# Patient Record
Sex: Female | Born: 1980
Health system: Southern US, Community
[De-identification: ages and names within clinical notes are randomized; demographics above are authoritative.]

## PROBLEM LIST (undated history)

## (undated) DIAGNOSIS — R011 Cardiac murmur, unspecified: Secondary | ICD-10-CM

## (undated) DIAGNOSIS — Z972 Presence of dental prosthetic device (complete) (partial): Secondary | ICD-10-CM

## (undated) DIAGNOSIS — M8700 Idiopathic aseptic necrosis of unspecified bone: Secondary | ICD-10-CM

## (undated) DIAGNOSIS — N979 Female infertility, unspecified: Secondary | ICD-10-CM

## (undated) DIAGNOSIS — E119 Type 2 diabetes mellitus without complications: Secondary | ICD-10-CM

## (undated) DIAGNOSIS — B20 Human immunodeficiency virus [HIV] disease: Secondary | ICD-10-CM

## (undated) DIAGNOSIS — Z862 Personal history of diseases of the blood and blood-forming organs and certain disorders involving the immune mechanism: Secondary | ICD-10-CM

## (undated) DIAGNOSIS — S62109A Fracture of unspecified carpal bone, unspecified wrist, initial encounter for closed fracture: Secondary | ICD-10-CM

## (undated) DIAGNOSIS — E785 Hyperlipidemia, unspecified: Secondary | ICD-10-CM

## (undated) DIAGNOSIS — M549 Dorsalgia, unspecified: Secondary | ICD-10-CM

## (undated) DIAGNOSIS — K760 Fatty (change of) liver, not elsewhere classified: Secondary | ICD-10-CM

## (undated) DIAGNOSIS — Z21 Asymptomatic human immunodeficiency virus [HIV] infection status: Secondary | ICD-10-CM

## (undated) DIAGNOSIS — F329 Major depressive disorder, single episode, unspecified: Secondary | ICD-10-CM

## (undated) DIAGNOSIS — Z87828 Personal history of other (healed) physical injury and trauma: Secondary | ICD-10-CM

## (undated) DIAGNOSIS — J302 Other seasonal allergic rhinitis: Secondary | ICD-10-CM

## (undated) DIAGNOSIS — I10 Essential (primary) hypertension: Secondary | ICD-10-CM

## (undated) DIAGNOSIS — E039 Hypothyroidism, unspecified: Secondary | ICD-10-CM

## (undated) DIAGNOSIS — J45909 Unspecified asthma, uncomplicated: Secondary | ICD-10-CM

## (undated) DIAGNOSIS — R945 Abnormal results of liver function studies: Secondary | ICD-10-CM

## (undated) DIAGNOSIS — J189 Pneumonia, unspecified organism: Secondary | ICD-10-CM

## (undated) DIAGNOSIS — F909 Attention-deficit hyperactivity disorder, unspecified type: Secondary | ICD-10-CM

## (undated) DIAGNOSIS — E282 Polycystic ovarian syndrome: Secondary | ICD-10-CM

## (undated) DIAGNOSIS — F32A Depression, unspecified: Secondary | ICD-10-CM

## (undated) DIAGNOSIS — R7989 Other specified abnormal findings of blood chemistry: Secondary | ICD-10-CM

## (undated) DIAGNOSIS — E669 Obesity, unspecified: Secondary | ICD-10-CM

## (undated) DIAGNOSIS — R0602 Shortness of breath: Secondary | ICD-10-CM

## (undated) DIAGNOSIS — K219 Gastro-esophageal reflux disease without esophagitis: Secondary | ICD-10-CM

## (undated) DIAGNOSIS — F419 Anxiety disorder, unspecified: Secondary | ICD-10-CM

## (undated) DIAGNOSIS — Z973 Presence of spectacles and contact lenses: Secondary | ICD-10-CM

## (undated) HISTORY — DX: Shortness of breath: R06.02

## (undated) HISTORY — DX: Dorsalgia, unspecified: M54.9

## (undated) HISTORY — PX: OTHER SURGICAL HISTORY: SHX169

## (undated) HISTORY — DX: Female infertility, unspecified: N97.9

---

## 2010-08-30 LAB — HM PAP SMEAR: HM Pap smear: NEGATIVE

## 2010-10-31 ENCOUNTER — Ambulatory Visit: Payer: Self-pay

## 2010-11-09 DIAGNOSIS — Z113 Encounter for screening for infections with a predominantly sexual mode of transmission: Secondary | ICD-10-CM

## 2010-11-09 DIAGNOSIS — B20 Human immunodeficiency virus [HIV] disease: Secondary | ICD-10-CM

## 2010-11-14 ENCOUNTER — Ambulatory Visit (INDEPENDENT_AMBULATORY_CARE_PROVIDER_SITE_OTHER): Payer: Self-pay

## 2010-11-14 DIAGNOSIS — F909 Attention-deficit hyperactivity disorder, unspecified type: Secondary | ICD-10-CM | POA: Insufficient documentation

## 2010-11-14 DIAGNOSIS — B20 Human immunodeficiency virus [HIV] disease: Secondary | ICD-10-CM | POA: Insufficient documentation

## 2010-11-14 DIAGNOSIS — D693 Immune thrombocytopenic purpura: Secondary | ICD-10-CM | POA: Insufficient documentation

## 2010-11-14 LAB — RPR

## 2010-11-14 LAB — CBC WITH DIFFERENTIAL/PLATELET
Basophils Absolute: 0 10*3/uL (ref 0.0–0.1)
Basophils Relative: 1 % (ref 0–1)
Eosinophils Absolute: 0.2 10*3/uL (ref 0.0–0.7)
Eosinophils Relative: 3 % (ref 0–5)
HCT: 43.9 % (ref 36.0–46.0)
Hemoglobin: 15 g/dL (ref 12.0–15.0)
Lymphocytes Relative: 27 % (ref 12–46)
Lymphs Abs: 1.8 10*3/uL (ref 0.7–4.0)
MCH: 35.4 pg — ABNORMAL HIGH (ref 26.0–34.0)
MCHC: 34.2 g/dL (ref 30.0–36.0)
MCV: 103.5 fL — ABNORMAL HIGH (ref 78.0–100.0)
Monocytes Absolute: 0.6 10*3/uL (ref 0.1–1.0)
Monocytes Relative: 9 % (ref 3–12)
Neutro Abs: 4.1 10*3/uL (ref 1.7–7.7)
Neutrophils Relative %: 61 % (ref 43–77)
Platelets: 293 10*3/uL (ref 150–400)
RBC: 4.24 MIL/uL (ref 3.87–5.11)
RDW: 12.7 % (ref 11.5–15.5)
WBC: 6.7 10*3/uL (ref 4.0–10.5)

## 2010-11-14 LAB — HEPATITIS B SURFACE ANTIBODY,QUALITATIVE: Hep B S Ab: NEGATIVE

## 2010-11-14 LAB — HEPATITIS B SURFACE ANTIGEN: Hepatitis B Surface Ag: NEGATIVE

## 2010-11-14 LAB — HEPATITIS C ANTIBODY: HCV Ab: NEGATIVE

## 2010-11-14 MED ORDER — PNEUMOCOCCAL VAC POLYVALENT 25 MCG/0.5ML IJ INJ: 0.5000 mL | INJECTION | Freq: Once | INTRAMUSCULAR | Status: DC

## 2010-11-14 NOTE — Progress Notes (Signed)
Pt started current regimen in may 2012. Prior regimen Atripla  Regimen changed since pt is trying to conceive.  Her partner has not been tested and they are not using condoms.  Pt is seeking employment as a Social worker and has been without her Ritalin since June.2012

## 2010-11-15 LAB — COMPLETE METABOLIC PANEL WITH GFR
ALT: 34 U/L (ref 0–35)
AST: 41 U/L — ABNORMAL HIGH (ref 0–37)
Albumin: 4.6 g/dL (ref 3.5–5.2)
Alkaline Phosphatase: 127 U/L — ABNORMAL HIGH (ref 39–117)
BUN: 10 mg/dL (ref 6–23)
CO2: 22 mEq/L (ref 19–32)
Calcium: 9.5 mg/dL (ref 8.4–10.5)
Chloride: 103 mEq/L (ref 96–112)
Creat: 0.7 mg/dL (ref 0.50–1.10)
GFR, Est African American: >60 mL/min (ref >60)
GFR, Est Non African American: >60 mL/min (ref >60)
Glucose, Bld: 81 mg/dL (ref 70–99)
Potassium: 4.4 mEq/L (ref 3.5–5.3)
Sodium: 140 mEq/L (ref 135–145)
Total Bilirubin: 0.3 mg/dL (ref 0.3–1.2)
Total Protein: 7.5 g/dL (ref 6.0–8.3)

## 2010-11-15 LAB — HEPATITIS B CORE ANTIBODY, IGM: Hep B C IgM: NEGATIVE

## 2010-11-15 LAB — T-HELPER CELL (CD4) - (RCID CLINIC ONLY)
CD4 % Helper T Cell: 43 % (ref 33–55)
CD4 T Cell Abs: 830 uL (ref 400–2700)

## 2010-11-15 LAB — HIV-1 RNA ULTRAQUANT REFLEX TO GENTYP+
HIV 1 RNA Quant: <20 copies/mL (ref <20)
HIV-1 RNA Quant, Log: <1.3 {Log} (ref <1.30)

## 2010-11-15 LAB — HEPATITIS A ANTIBODY, IGM: Hep A IgM: NEGATIVE

## 2010-12-01 ENCOUNTER — Ambulatory Visit (INDEPENDENT_AMBULATORY_CARE_PROVIDER_SITE_OTHER): Payer: Self-pay | Admitting: Internal Medicine

## 2010-12-01 ENCOUNTER — Encounter: Payer: Self-pay | Admitting: Internal Medicine

## 2010-12-01 VITALS — BP 128/84 | HR 85 | Temp 98.3°F | Ht 63.5 in | Wt 161.0 lb

## 2010-12-01 DIAGNOSIS — B20 Human immunodeficiency virus [HIV] disease: Secondary | ICD-10-CM

## 2010-12-01 MED ORDER — DARUNAVIR ETHANOLATE 400 MG PO TABS: 800.0000 mg | ORAL_TABLET | Freq: Every day | ORAL | Status: DC

## 2010-12-01 MED ORDER — RITONAVIR 100 MG PO CAPS: 100.0000 mg | ORAL_CAPSULE | Freq: Every day | ORAL | Status: DC

## 2010-12-01 MED ORDER — EMTRICITABINE-TENOFOVIR DF 200-300 MG PO TABS
1.0000 | ORAL_TABLET | Freq: Every day | ORAL | Status: DC
Start: 2010-12-01 — End: 2010-12-08

## 2010-12-01 NOTE — Progress Notes (Signed)
  Subjective:    Patient ID: Lindsay French, female    DOB: Apr 21, 1981, 30 y.o.   MRN: [id number]  HPI Here as a new patient.  30 yo with HIV initially diagnosed in [place] about 6 years ago and started initially on Atripla and switched to boosted darunavir with Truvada since she wanted to get pregnant.  She then briefly switched back to Atripla after deciding not to get pregnant and again back to boosted darunavir while in [facility] and now recently got married and moved to [facility] where her husband is from.  She was on ADAP in [place] and has been getting her meds refilled but recently told they wil no longer do that.  She now presents for her first MD appt and notes that she has 1 week left of meds and is not yet through ADAP.  She otherwise reports she has been undetectable since starting meds except for one episode where she was briefly out.  She is married and her partner knows her status, though she reports he refuses to use condoms in addition to the fact they are trying to get pregnant.  She does state she understands the risks to him.    She also states she takes Ritalin for ADHD and would like that refilled.  She has been out for 2 months.      Review of Systems  Constitutional: Negative.   HENT: Negative.   Eyes: Negative.   Respiratory: Negative.   Cardiovascular: Negative.   Gastrointestinal: Negative.   Genitourinary: Negative.   Musculoskeletal: Negative.   Skin: Negative.   Neurological: Negative.   Hematological: Negative.   Psychiatric/Behavioral: Negative.        Objective:   Physical Exam  Constitutional: She is oriented to person, place, and time. She appears well-developed and well-nourished.  HENT:  Mouth/Throat: No oropharyngeal exudate.  Neck: Normal range of motion. Neck supple.  Cardiovascular: Normal rate, regular rhythm and normal heart sounds.   No murmur heard. Pulmonary/Chest: Effort normal and breath sounds normal. No respiratory distress.  Abdominal: Soft.  Bowel sounds are normal. She exhibits no distension.  Lymphadenopathy:    She has no cervical adenopathy.  Neurological: She is alert and oriented to person, place, and time.  Skin: Skin is warm and dry. No erythema.  Psychiatric: She has a normal mood and affect. Her behavior is normal.          Assessment & Plan:

## 2010-12-01 NOTE — Assessment & Plan Note (Signed)
Doing well on her regimen but unfortunately only has 1 week of her medicines left.  She likely will not have ADAP by that time and therefore was provided with samples.  Unfortunately we do not have Norvir and so she was provided with Kaletra to take with the Truvada until she can get her prescriptions filled.  She was though hesitant to switch and so was offered a prescription she could fill of norvir (and pay for) in the meantime, but she refused due to not having the money.  She therefore was instructed to start the Kaletra once her darunavir runs out.    I also have referred her to [facility] since she is trying to get pregnant.   I did discuss with her that her husband is at risk of infection without condoms and encouraged condom use.  I did state that he can consider prevention with Truvada during attempts at conception, but that is not a long term solution.  The patient will return in 1 month, sooner if indicated.

## 2010-12-08 ENCOUNTER — Ambulatory Visit (INDEPENDENT_AMBULATORY_CARE_PROVIDER_SITE_OTHER): Payer: Self-pay | Admitting: Adult Health

## 2010-12-08 ENCOUNTER — Encounter: Payer: Self-pay | Admitting: Adult Health

## 2010-12-08 ENCOUNTER — Other Ambulatory Visit: Payer: Self-pay | Admitting: *Deleted

## 2010-12-08 DIAGNOSIS — Z0000 Encounter for general adult medical examination without abnormal findings: Secondary | ICD-10-CM

## 2010-12-08 DIAGNOSIS — B20 Human immunodeficiency virus [HIV] disease: Secondary | ICD-10-CM

## 2010-12-08 DIAGNOSIS — F909 Attention-deficit hyperactivity disorder, unspecified type: Secondary | ICD-10-CM

## 2010-12-08 DIAGNOSIS — Z79899 Other long term (current) drug therapy: Secondary | ICD-10-CM

## 2010-12-08 MED ORDER — RITONAVIR 100 MG PO CAPS: 100.0000 mg | ORAL_CAPSULE | Freq: Every day | ORAL | Status: DC

## 2010-12-08 MED ORDER — METHYLPHENIDATE HCL 10 MG PO TABS: ORAL_TABLET | ORAL | Status: DC

## 2010-12-08 MED ORDER — EMTRICITABINE-TENOFOVIR DF 200-300 MG PO TABS: 1.0000 | ORAL_TABLET | Freq: Every day | ORAL | Status: DC

## 2010-12-08 MED ORDER — DARUNAVIR ETHANOLATE 400 MG PO TABS: 800.0000 mg | ORAL_TABLET | Freq: Every day | ORAL | Status: DC

## 2010-12-08 MED ORDER — INFLUENZA VAC TYP A&B SURF ANT IM INJ: 0.5000 mL | INJECTION | Freq: Once | INTRAMUSCULAR | Status: DC

## 2010-12-08 MED ORDER — HEPATITIS B VAC RECOMBINANT 5 MCG/0.5ML IJ SUSP: 0.5000 mL | Freq: Once | INTRAMUSCULAR | Status: DC

## 2010-12-08 MED ORDER — METHYLPHENIDATE HCL ER (LA) 30 MG PO CP24: ORAL_CAPSULE | ORAL | Status: DC

## 2010-12-08 NOTE — Progress Notes (Signed)
[name] returns to clinic today for a reevaluation of her medications. She was seen by Dr. Luciana Axe on 12/01/2010 for an initial visit. She was close to running out of medications that she was taking while she was in [place] and received a different regimen from Dr. Ronnie Derby than what she was use to taking. She also had expressed her attempts to become pregnant, and she was not given a prescription for Ritalin for her ADHD as a result of this. She came today to ask for clarification as to why, she had an HIV treatment regimen change, and why, she was not able to be prescribed her Ritalin. After careful review of the notes from the previous visit, it was explained to her that most amphetamine-based drugs are category C pregnancy class and are not advised to be used during the first trimester of pregnancy. At that point, I recommended that she be referred to one of the local mental health centers for evaluation of her ADHD and recommendations for pregnancy-safe therapies. As it takes approximately one month to obtain an appointment with a mental health professional, we agreed to fill her Ritalin as prescribed, with the understanding that she must, not attempt to get pregnant until this medication is further evaluated by a psychiatrist. Additionally, I explained to her that Kaletra was given to her as we did not have available, at that time, any Norvir to boost her Prezista. Also, it was noted that Kaletra, which she was given, is an approved therapy for treatment of HIV in pregnancy. However, we were able to obtain a bottle of Norvir and Prezista, and as she has agreed to abstain from getting pregnant while she is currently taking Ritalin, there is no need for her to Kaletra at this time. We've asked that she return for followup in 4 months with repeat labs 2 weeks before her next appointment. However, if in the interim, she has learned that she might be pregnant or think she might be pregnant, she is instructed to  come to clinic for a confirmatory pregnancy test. At that time, we will discuss other treatment possibilities for her. Additionally, she is to receive hepatitis B vaccine #1 today and return to clinic in one month for a nurse visit for hepatitis B vaccine #2 and hepatitis A vaccine #1.  She verbally acknowledged all information that was provided to her and agreed with plan of care.

## 2010-12-08 NOTE — Patient Instructions (Signed)
Return to clinic in one month for hepatitis B #2, and hepatitis A #1 vaccines.

## 2010-12-11 ENCOUNTER — Other Ambulatory Visit: Payer: Self-pay | Admitting: *Deleted

## 2010-12-11 DIAGNOSIS — B20 Human immunodeficiency virus [HIV] disease: Secondary | ICD-10-CM

## 2010-12-11 MED ORDER — EMTRICITABINE-TENOFOVIR DF 200-300 MG PO TABS: 1.0000 | ORAL_TABLET | Freq: Every day | ORAL | Status: DC

## 2010-12-11 MED ORDER — RITONAVIR 100 MG PO CAPS: 100.0000 mg | ORAL_CAPSULE | Freq: Every day | ORAL | Status: DC

## 2010-12-11 MED ORDER — DARUNAVIR ETHANOLATE 400 MG PO TABS: 800.0000 mg | ORAL_TABLET | Freq: Every day | ORAL | Status: DC

## 2010-12-12 ENCOUNTER — Telehealth: Payer: Self-pay | Admitting: *Deleted

## 2010-12-12 NOTE — Telephone Encounter (Signed)
Called patient to notify her of appointment at [facility] for 01/24/11 at 3:15 pm.  She was given the phone number for the clinic as she may need to reschedule due to her work schedule. Wendall Mola CMA

## 2011-01-24 ENCOUNTER — Encounter: Payer: Self-pay | Admitting: Advanced Practice Midwife

## 2011-04-04 ENCOUNTER — Telehealth: Payer: Self-pay | Admitting: *Deleted

## 2011-04-04 ENCOUNTER — Encounter: Payer: Self-pay | Admitting: *Deleted

## 2011-04-04 NOTE — Telephone Encounter (Signed)
Patient called c/o cold symptoms.  No available appointment today, patient spoke to [facility] worker Revonda Standard and agreed to go to urgent care.  She will call back to be scheduled for lab and f/u office visits. Wendall Mola CMA

## 2011-05-10 ENCOUNTER — Other Ambulatory Visit: Payer: Self-pay | Admitting: Licensed Clinical Social Worker

## 2011-05-10 DIAGNOSIS — B20 Human immunodeficiency virus [HIV] disease: Secondary | ICD-10-CM

## 2011-05-10 MED ORDER — DARUNAVIR ETHANOLATE 800 MG PO TABS: 800.0000 mg | ORAL_TABLET | Freq: Every day | ORAL | Status: DC

## 2011-09-27 ENCOUNTER — Telehealth: Payer: Self-pay | Admitting: *Deleted

## 2011-09-27 ENCOUNTER — Encounter: Payer: Self-pay | Admitting: *Deleted

## 2011-09-27 NOTE — Telephone Encounter (Signed)
Her phone has been disconnected. I sent her a letter asking her to call & make an appt

## 2011-10-01 ENCOUNTER — Other Ambulatory Visit: Payer: Self-pay | Admitting: *Deleted

## 2011-10-01 DIAGNOSIS — B20 Human immunodeficiency virus [HIV] disease: Secondary | ICD-10-CM

## 2011-10-01 MED ORDER — DARUNAVIR ETHANOLATE 800 MG PO TABS: 800.0000 mg | ORAL_TABLET | Freq: Every day | ORAL | Status: DC

## 2011-10-27 ENCOUNTER — Encounter (HOSPITAL_COMMUNITY): Payer: Self-pay | Admitting: *Deleted

## 2011-10-27 ENCOUNTER — Emergency Department (INDEPENDENT_AMBULATORY_CARE_PROVIDER_SITE_OTHER)
Admission: EM | Admit: 2011-10-27 | Discharge: 2011-10-27 | Disposition: A | Discharge status: Home / Self Care | Payer: Self-pay | Source: Home / Self Care | Attending: Emergency Medicine | Admitting: Emergency Medicine

## 2011-10-27 DIAGNOSIS — L519 Erythema multiforme, unspecified: Secondary | ICD-10-CM

## 2011-10-27 HISTORY — DX: Asymptomatic human immunodeficiency virus (hiv) infection status: Z21

## 2011-10-27 HISTORY — DX: Human immunodeficiency virus (HIV) disease: B20

## 2011-10-27 MED ORDER — PREDNISONE 10 MG PO TABS: ORAL_TABLET | ORAL | Status: DC

## 2011-10-27 MED ORDER — HYDROXYZINE HCL 25 MG PO TABS: 25.0000 mg | ORAL_TABLET | Freq: Four times a day (QID) | ORAL | Status: AC

## 2011-10-27 NOTE — ED Notes (Signed)
C/O pruritic rash to right thigh and buttock extending up into lower back since Wed. evening.    Has been applying cortizone, calamine, antifungal, using Oatmeal bath, and taking Tylenol.  Went to [facility] yesterday - was told by provider they have "no idea" what rash is.  Patient is a Social worker - unk exposures.  Yesterday had temp of 99.3 at [facility]; had some slight diarrhea and nausea last night which has since resolved.

## 2011-10-27 NOTE — ED Provider Notes (Signed)
Chief Complaint  Patient presents with  . Rash    History of Present Illness:   Lindsay French is a 31 year old HIV-positive female. She presents with a four-day history of a pruritic rash that began on her buttocks and thighs and now has spread to her lower back and arms. The rash consists of a small, erythematous maculopapules. These have not blistered up or crusted over. They are moderately pruritic. She's tried some Benadryl without relief. She cannot think of anything specific that she's come in contact with, although she does note that she sat on her steps that had been sprayed with an insect repellent before the rash came on. She doesn't think that any other parts of her body other than her posterior thighs were exposed to the insect spray. She's not on any new medications. She remains on Norvir, Truvada, and [place] for her HIV disease. She has followed up with the HIV clinic at the hospital here. She cannot recall her last CD4 count or viral titer, with thinks his viral titer was undetectable. She denies any other new medications, suspicious contacts, or systemic symptoms such as fever, chills, nausea, vomiting, or sore throat.  Review of Systems:  Other than noted above, the patient denies any of the following symptoms: Systemic:  No fever, chills, sweats, weight loss, or fatigue. ENT:  No nasal congestion, rhinorrhea, sore throat, swelling of lips, tongue or throat. Resp:  No cough, wheezing, or shortness of breath. Skin:  No rash, itching, nodules, or suspicious lesions.  [place]:  Past medical history, family history, social history, meds, and allergies were reviewed.  Physical Exam:   Vital signs:  BP 151/105  Pulse 83  Temp 98.2 F (36.8 C) (Oral)  Resp 19  SpO2 97%  LMP 10/27/2011 Gen:  Alert, oriented, in no distress. ENT:  Pharynx clear, no intraoral lesions, moist mucous membranes. Lungs:  Clear to auscultation. Skin:  She has multiple erythematous maculopapules on the right  posterior thigh and buttock, a few in the left posterior thigh and buttock, and a few on her lower back and her arms. Skin was otherwise clear.  Assessment:  The encounter diagnosis was Erythema multiforme.  Plan:   1.  The following meds were prescribed:   New Prescriptions   HYDROXYZINE (ATARAX/VISTARIL) 25 MG TABLET    Take 1 tablet (25 mg total) by mouth every 6 (six) hours.   PREDNISONE (DELTASONE) 10 MG TABLET    Take 4 tabs daily for 4 days, 3 tabs daily for 4 days, 2 tabs daily for 4 days, then 1 tab daily for 4 days.   2.  The patient was instructed in symptomatic care and handouts were given. 3.  The patient was told to return if becoming worse in any way, if no better in 3 or 4 days, and given some red flag symptoms that would indicate earlier return.  Follow up:  The patient was told to follow up suggested followup with Dr. Lavonna Monarch if this gets worse or is not getting any better with the above medications.      Harden Mo, MD 10/27/11 [phone]

## 2011-10-29 ENCOUNTER — Encounter (HOSPITAL_COMMUNITY): Payer: Self-pay | Admitting: *Deleted

## 2011-11-01 ENCOUNTER — Other Ambulatory Visit: Payer: Self-pay | Admitting: Internal Medicine

## 2011-11-01 DIAGNOSIS — Z113 Encounter for screening for infections with a predominantly sexual mode of transmission: Secondary | ICD-10-CM

## 2011-11-01 DIAGNOSIS — Z79899 Other long term (current) drug therapy: Secondary | ICD-10-CM

## 2011-11-01 DIAGNOSIS — B20 Human immunodeficiency virus [HIV] disease: Secondary | ICD-10-CM

## 2011-11-07 ENCOUNTER — Ambulatory Visit: Payer: Self-pay

## 2011-11-07 ENCOUNTER — Other Ambulatory Visit: Payer: Self-pay

## 2011-11-07 DIAGNOSIS — Z79899 Other long term (current) drug therapy: Secondary | ICD-10-CM

## 2011-11-07 DIAGNOSIS — Z113 Encounter for screening for infections with a predominantly sexual mode of transmission: Secondary | ICD-10-CM

## 2011-11-07 DIAGNOSIS — B20 Human immunodeficiency virus [HIV] disease: Secondary | ICD-10-CM

## 2011-11-07 LAB — CBC WITH DIFFERENTIAL/PLATELET
Basophils Absolute: 0 10*3/uL (ref 0.0–0.1)
Basophils Relative: 1 % (ref 0–1)
Eosinophils Absolute: 0.3 10*3/uL (ref 0.0–0.7)
Eosinophils Relative: 3 % (ref 0–5)
HCT: 42 % (ref 36.0–46.0)
Hemoglobin: 14 g/dL (ref 12.0–15.0)
Lymphocytes Relative: 24 % (ref 12–46)
Lymphs Abs: 2 10*3/uL (ref 0.7–4.0)
MCH: 32 pg (ref 26.0–34.0)
MCHC: 33.3 g/dL (ref 30.0–36.0)
MCV: 96.1 fL (ref 78.0–100.0)
Monocytes Absolute: 0.7 10*3/uL (ref 0.1–1.0)
Monocytes Relative: 8 % (ref 3–12)
Neutro Abs: 5.4 10*3/uL (ref 1.7–7.7)
Neutrophils Relative %: 64 % (ref 43–77)
Platelets: 338 10*3/uL (ref 150–400)
RBC: 4.37 MIL/uL (ref 3.87–5.11)
RDW: 13.8 % (ref 11.5–15.5)
WBC: 8.5 10*3/uL (ref 4.0–10.5)

## 2011-11-07 LAB — COMPREHENSIVE METABOLIC PANEL
ALT: 23 U/L (ref 0–35)
AST: 19 U/L (ref 0–37)
Albumin: 4.5 g/dL (ref 3.5–5.2)
Alkaline Phosphatase: 124 U/L — ABNORMAL HIGH (ref 39–117)
BUN: 10 mg/dL (ref 6–23)
CO2: 28 mEq/L (ref 19–32)
Calcium: 9.7 mg/dL (ref 8.4–10.5)
Chloride: 103 mEq/L (ref 96–112)
Creat: 0.62 mg/dL (ref 0.50–1.10)
Glucose, Bld: 85 mg/dL (ref 70–99)
Potassium: 4.5 mEq/L (ref 3.5–5.3)
Sodium: 138 mEq/L (ref 135–145)
Total Bilirubin: 0.3 mg/dL (ref 0.3–1.2)
Total Protein: 7.5 g/dL (ref 6.0–8.3)

## 2011-11-07 LAB — LIPID PANEL
Cholesterol: 226 mg/dL — ABNORMAL HIGH (ref 0–200)
HDL: 52 mg/dL (ref >39)
LDL Cholesterol: 121 mg/dL — ABNORMAL HIGH (ref 0–99)
Total CHOL/HDL Ratio: 4.3 Ratio
Triglycerides: 264 mg/dL — ABNORMAL HIGH (ref <150)
VLDL: 53 mg/dL — ABNORMAL HIGH (ref 0–40)

## 2011-11-07 LAB — RPR

## 2011-11-08 LAB — T-HELPER CELL (CD4) - (RCID CLINIC ONLY)
CD4 % Helper T Cell: 44 % (ref 33–55)
CD4 T Cell Abs: 860 uL (ref 400–2700)

## 2011-11-09 LAB — HIV-1 RNA QUANT-NO REFLEX-BLD
HIV 1 RNA Quant: <20 copies/mL (ref <20)
HIV-1 RNA Quant, Log: <1.3 {Log} (ref <1.30)

## 2011-11-21 ENCOUNTER — Encounter: Payer: Self-pay | Admitting: Internal Medicine

## 2011-11-21 ENCOUNTER — Ambulatory Visit (INDEPENDENT_AMBULATORY_CARE_PROVIDER_SITE_OTHER): Payer: Self-pay | Admitting: Internal Medicine

## 2011-11-21 VITALS — BP 139/88 | HR 85 | Temp 98.4°F | Ht 63.5 in | Wt 184.0 lb

## 2011-11-21 DIAGNOSIS — B20 Human immunodeficiency virus [HIV] disease: Secondary | ICD-10-CM

## 2011-11-21 DIAGNOSIS — Z23 Encounter for immunization: Secondary | ICD-10-CM

## 2011-11-21 DIAGNOSIS — F909 Attention-deficit hyperactivity disorder, unspecified type: Secondary | ICD-10-CM

## 2011-11-21 NOTE — Assessment & Plan Note (Addendum)
She is doing well with her regimen. She was reminded to have more periodic followup and will return in 6 months. Sooner if she needs to. She was reminded that condoms are ideal for her partner who is HIV negative. In regards to her infertility. She was offered referral to women's clinic though is unable to pay and does not qualify for an [place] card do to her income. She will look for alternative means.

## 2011-11-21 NOTE — Assessment & Plan Note (Signed)
She remains off of therapy. No current issues.

## 2011-11-21 NOTE — Progress Notes (Signed)
  Subjective:    Patient ID: Lindsay French, female    DOB: 05-Jan-1981, 31 y.o.   MRN: [id number]  HPI Here for followup of her HIV. She first established care at this clinic one year ago and did [place] being [place] here in [place] and therefore had an interruption in her care. She did get started though on her regimen which is resistant, Norvir and Truvada. She came back one month after her first appointment but has not been back since, about one year. Despite that, she does endorse good compliance and no missed doses. Her labs are reassuring with an undetectable viral load and a CD4 count of over 800. She has no complaints. She is trying to get pregnant though has had no success over the last year. She is interested in referral for infertility.   Review of Systems  Constitutional: Negative for fever, chills, fatigue and unexpected weight change.  HENT: Negative for sore throat and trouble swallowing.   Respiratory: Negative for cough, shortness of breath and wheezing.   Cardiovascular: Negative for chest pain, palpitations and leg swelling.  Gastrointestinal: Negative for nausea, abdominal pain, diarrhea and constipation.  Genitourinary: Negative for dysuria.  Musculoskeletal: Negative for myalgias, joint swelling and arthralgias.  Skin: Negative for rash.  Neurological: Negative for dizziness, light-headedness and headaches.  Hematological: Negative for adenopathy.       Objective:   Physical Exam  Constitutional: She appears well-developed and well-nourished. No distress.  HENT:  Mouth/Throat: Oropharynx is clear and moist. No oropharyngeal exudate.  Cardiovascular: Normal rate, regular rhythm and normal heart sounds.  Exam reveals no gallop and no friction rub.   No murmur heard. Pulmonary/Chest: Effort normal and breath sounds normal. No respiratory distress. She has no wheezes. She has no rales.  Abdominal: Soft. Bowel sounds are  normal. She exhibits no distension. There is no tenderness. There is no rebound.  Lymphadenopathy:    She has no cervical adenopathy.          Assessment & Plan:

## 2011-11-22 LAB — TSH: TSH: 2.234 u[IU]/mL (ref 0.350–4.500)

## 2011-11-26 ENCOUNTER — Telehealth: Payer: Self-pay | Admitting: *Deleted

## 2011-11-26 NOTE — Telephone Encounter (Signed)
Patient notified lab results were normal. Wendall Mola CMA

## 2011-11-26 NOTE — Telephone Encounter (Signed)
Message copied by Macy Mis on Mon Nov 26, 2011  2:26 PM ------      Message from: Gardiner Barefoot      Created: Mon Nov 26, 2011  2:15 PM       Please let pt know that her thyroid test is normal.  Thanks

## 2011-11-27 ENCOUNTER — Other Ambulatory Visit: Payer: Self-pay | Admitting: *Deleted

## 2011-11-27 DIAGNOSIS — B20 Human immunodeficiency virus [HIV] disease: Secondary | ICD-10-CM

## 2011-11-27 MED ORDER — EMTRICITABINE-TENOFOVIR DF 200-300 MG PO TABS: 1.0000 | ORAL_TABLET | Freq: Every day | ORAL | Status: DC

## 2011-11-27 MED ORDER — RITONAVIR 100 MG PO CAPS: 100.0000 mg | ORAL_CAPSULE | Freq: Every day | ORAL | Status: DC

## 2011-11-27 MED ORDER — DARUNAVIR ETHANOLATE 800 MG PO TABS: 800.0000 mg | ORAL_TABLET | Freq: Every day | ORAL | Status: DC

## 2011-11-27 NOTE — Telephone Encounter (Signed)
Patient ADAP was approved and meds sent to the pharmacy.

## 2011-11-28 ENCOUNTER — Other Ambulatory Visit: Payer: Self-pay | Admitting: *Deleted

## 2011-11-28 DIAGNOSIS — B20 Human immunodeficiency virus [HIV] disease: Secondary | ICD-10-CM

## 2011-11-28 MED ORDER — EMTRICITABINE-TENOFOVIR DF 200-300 MG PO TABS: 1.0000 | ORAL_TABLET | Freq: Every day | ORAL | Status: DC

## 2011-12-11 ENCOUNTER — Other Ambulatory Visit: Payer: Self-pay | Admitting: Licensed Clinical Social Worker

## 2011-12-11 DIAGNOSIS — B20 Human immunodeficiency virus [HIV] disease: Secondary | ICD-10-CM

## 2011-12-11 MED ORDER — RITONAVIR 100 MG PO CAPS: 100.0000 mg | ORAL_CAPSULE | Freq: Every day | ORAL | Status: DC

## 2011-12-11 MED ORDER — DARUNAVIR ETHANOLATE 800 MG PO TABS: 800.0000 mg | ORAL_TABLET | Freq: Every day | ORAL | Status: DC

## 2011-12-11 MED ORDER — EMTRICITABINE-TENOFOVIR DF 200-300 MG PO TABS: 1.0000 | ORAL_TABLET | Freq: Every day | ORAL | Status: DC

## 2011-12-21 ENCOUNTER — Ambulatory Visit (INDEPENDENT_AMBULATORY_CARE_PROVIDER_SITE_OTHER): Payer: Self-pay | Admitting: *Deleted

## 2011-12-21 DIAGNOSIS — Z124 Encounter for screening for malignant neoplasm of cervix: Secondary | ICD-10-CM

## 2011-12-21 NOTE — Progress Notes (Signed)
  Subjective:     Lindsay French is a 31 y.o. woman who comes in today for a  pap smear only.  Previous abnormal Pap smears:yes, approx 5 years ago.  Had COLPO. Contraception:  Trying to get pregnant.  Objective:    There were no vitals taken for this visit. Pelvic Exam: Pap smear obtained.   Assessment:    Screening pap smear.   Plan:    Follow up in one year, or as indicated by Pap results.  Pt given educational materials re: HIV and women, BSE, PAP smears,, heart disease and HIV, self-esteem and partner safety. Pt given condoms.

## 2011-12-21 NOTE — Patient Instructions (Signed)
Your results will be ready in about a week.  I will mail them to you.  Thank you for coming to the Center for your care.  Dusan Lipford,  RN 

## 2011-12-28 ENCOUNTER — Encounter: Payer: Self-pay | Admitting: *Deleted

## 2012-01-08 ENCOUNTER — Other Ambulatory Visit: Payer: Self-pay | Admitting: *Deleted

## 2012-01-08 DIAGNOSIS — Z113 Encounter for screening for infections with a predominantly sexual mode of transmission: Secondary | ICD-10-CM

## 2012-02-05 ENCOUNTER — Other Ambulatory Visit: Payer: Self-pay | Admitting: *Deleted

## 2012-02-05 DIAGNOSIS — B20 Human immunodeficiency virus [HIV] disease: Secondary | ICD-10-CM

## 2012-02-05 MED ORDER — DARUNAVIR ETHANOLATE 800 MG PO TABS: 800.0000 mg | ORAL_TABLET | Freq: Every day | ORAL | Status: DC

## 2012-02-05 MED ORDER — EMTRICITABINE-TENOFOVIR DF 200-300 MG PO TABS: 1.0000 | ORAL_TABLET | Freq: Every day | ORAL | Status: DC

## 2012-02-05 MED ORDER — RITONAVIR 100 MG PO CAPS: 100.0000 mg | ORAL_CAPSULE | Freq: Every day | ORAL | Status: DC

## 2012-03-12 ENCOUNTER — Other Ambulatory Visit: Payer: Self-pay | Admitting: *Deleted

## 2012-03-12 DIAGNOSIS — B20 Human immunodeficiency virus [HIV] disease: Secondary | ICD-10-CM

## 2012-03-12 MED ORDER — RITONAVIR 100 MG PO TABS: 100.0000 mg | ORAL_TABLET | Freq: Every day | ORAL | Status: DC

## 2012-03-12 NOTE — Telephone Encounter (Signed)
Changed Norvir capsules to Norvir tablets.

## 2012-04-29 ENCOUNTER — Other Ambulatory Visit: Payer: Self-pay

## 2012-05-13 ENCOUNTER — Telehealth: Payer: Self-pay | Admitting: *Deleted

## 2012-05-13 ENCOUNTER — Ambulatory Visit: Payer: Self-pay | Admitting: Internal Medicine

## 2012-05-13 NOTE — Telephone Encounter (Signed)
Called patient to reschedule appt, she no showed today. She said she does not qualify for [employer] due to income and she does not want to get labs because it costs too much. She has been diagnosed for several years and is undetectable and wants only yearly labs. 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 Wendall Mola

## 2012-05-16 ENCOUNTER — Telehealth: Payer: Self-pay | Admitting: *Deleted

## 2012-05-16 NOTE — Telephone Encounter (Signed)
Patient called requesting a prescription for prophylactic antibiotics. She cracked her molar last weekend and the pain has increased to the point where she will go to have it pulled next week. She wanted to know if we could send in a prescription for antibiotics in the mean time. Please advise. Andree Coss, RN

## 2012-05-19 ENCOUNTER — Telehealth: Payer: Self-pay | Admitting: *Deleted

## 2012-05-19 ENCOUNTER — Emergency Department (INDEPENDENT_AMBULATORY_CARE_PROVIDER_SITE_OTHER)
Admission: EM | Admit: 2012-05-19 | Discharge: 2012-05-19 | Discharge status: Against Medical Advice | Payer: Self-pay | Source: Home / Self Care | Attending: Family Medicine | Admitting: Family Medicine

## 2012-05-19 ENCOUNTER — Encounter (HOSPITAL_COMMUNITY): Payer: Self-pay | Admitting: Emergency Medicine

## 2012-05-19 DIAGNOSIS — J111 Influenza due to unidentified influenza virus with other respiratory manifestations: Secondary | ICD-10-CM

## 2012-05-19 NOTE — ED Notes (Addendum)
Pt c/o cold/flu like sx since this am Reports having a tooth ache and a ear ache over the weekend which has resolved Sx today include: fevers, body aches, sore throat, runny nose, nasal congestion, Denies: v/n/d Has not taken any meds today  She is alert w/no signs of acute distress.

## 2012-05-19 NOTE — Telephone Encounter (Signed)
If she thinks it is infected, she will need to be seen if she can't get to a dentist soon.  Antibiotics are not generally prescribed for a broken tooth and there is no indication for antibiotic prophylaxis.

## 2012-05-19 NOTE — Telephone Encounter (Signed)
Patient called and advised she is in a lot of pain. She advised she has a broken tooth and needs an antibiotic to treat the infection prior to her dental procedure. I advised the patient that our doctors do not usually do this without a visit and if she has a dentist they will usually call in an antibiotic. She advised she would like for me to ask anyway. Advised her will call her once we get a response from the doctor.

## 2012-05-19 NOTE — Telephone Encounter (Signed)
Called patient and advised her of this and she advised she needs to be seen as she knows she has an infection in her mouth she was very aggitated. Gave her an appt to see Dr Luciana Axe tomorrow at 10 am

## 2012-05-19 NOTE — ED Provider Notes (Signed)
History     CSN: [id number]  Arrival date & time 05/19/12  1708   First MD Initiated Contact with Patient 05/19/12 1718      Chief Complaint  Patient presents with  . URI    (Consider location/radiation/quality/duration/timing/severity/associated sxs/prior treatment) Patient is a 32 y.o. female presenting with URI. The history is provided by the patient.  URI The primary symptoms include fever, sore throat and myalgias. Primary symptoms do not include cough, abdominal pain, nausea, vomiting or rash. The current episode started 2 days ago. This is a new problem.  Symptoms associated with the illness include chills, congestion and rhinorrhea.    Past Medical History  Diagnosis Date  . HIV (human immunodeficiency virus infection)     Past Surgical History  Procedure Date  . Maxillofacial surgery     Family History  Problem Relation Age of Onset  . Hypothyroidism Mother     History  Substance Use Topics  . Smoking status: Current Every Day Smoker -- 0.3 packs/day for 15 years    Types: Cigarettes  . Smokeless tobacco: Never Used     Comment: Smokes 3 cigarettes per day  . Alcohol Use: 4.2 oz/week    7 Glasses of wine per week     Comment: wine    OB History    Grav Para Term Preterm Abortions TAB SAB Ect Mult Living                  Review of Systems  Constitutional: Positive for fever and chills.  HENT: Positive for congestion, sore throat, rhinorrhea and dental problem.   Respiratory: Negative for cough.   Gastrointestinal: Negative for nausea, vomiting and abdominal pain.  Musculoskeletal: Positive for myalgias.  Skin: Negative for rash.    Allergies  Review of patient's allergies indicates no known allergies.  Home Medications   Current Outpatient Rx  Name  Route  Sig  Dispense  Refill  . DARUNAVIR ETHANOLATE 800 MG PO TABS   Oral   Take 1 tablet (800 mg total) by mouth daily.   30 tablet   5   . EMTRICITABINE-TENOFOVIR 200-300 MG PO TABS  Oral   Take 1 tablet by mouth daily.   30 tablet   5   . RITONAVIR 100 MG PO TABS   Oral   Take 1 tablet (100 mg total) by mouth daily. With Prezista.   30 tablet   5     BP 139/94  Pulse 123  Temp 101.3 F (38.5 C) (Oral)  Resp 18  SpO2 99%  LMP 05/16/2012  Physical Exam  Nursing note and vitals reviewed. Constitutional: She is oriented to person, place, and time. She appears well-developed and well-nourished.  HENT:  Head: Normocephalic.  Right Ear: External ear normal.  Left Ear: External ear normal.  Mouth/Throat: Oropharynx is clear and moist.  Eyes: Conjunctivae normal are normal. Pupils are equal, round, and reactive to light.  Neck: Normal range of motion. Neck supple.  Cardiovascular: Normal rate, normal heart sounds and intact distal pulses.   Pulmonary/Chest: Effort normal and breath sounds normal.  Musculoskeletal: She exhibits no tenderness.  Neurological: She is alert and oriented to person, place, and time.  Skin: Skin is warm and dry.    ED Course  Procedures (including critical care time)  Labs Reviewed - No data to display No results found.   1. Influenza-like illness       MDM  Pt upset that I would not rx z-pack for  influenza.        Linna Hoff, MD 05/19/12 [phone]

## 2012-05-20 ENCOUNTER — Ambulatory Visit (INDEPENDENT_AMBULATORY_CARE_PROVIDER_SITE_OTHER): Payer: Self-pay | Admitting: Internal Medicine

## 2012-05-20 ENCOUNTER — Encounter: Payer: Self-pay | Admitting: Internal Medicine

## 2012-05-20 VITALS — BP 128/84 | HR 85 | Temp 98.3°F | Ht 63.0 in | Wt 196.0 lb

## 2012-05-20 DIAGNOSIS — Z23 Encounter for immunization: Secondary | ICD-10-CM

## 2012-05-20 DIAGNOSIS — J069 Acute upper respiratory infection, unspecified: Secondary | ICD-10-CM

## 2012-05-20 DIAGNOSIS — B20 Human immunodeficiency virus [HIV] disease: Secondary | ICD-10-CM

## 2012-05-20 NOTE — Addendum Note (Signed)
Addended by: Wendall Mola A on: 05/20/2012 10:35 AM   Modules accepted: Orders

## 2012-05-20 NOTE — Assessment & Plan Note (Signed)
I will check her viral load today to assure suppression. She will then return in 6 months

## 2012-05-20 NOTE — Assessment & Plan Note (Signed)
She has symptoms consistent with a URI. Supportive care. I also discussed that after she gets over this, she may try allergy medicine since she reports a history of chronic nasal congestion.

## 2012-05-20 NOTE — Progress Notes (Signed)
  Subjective:    Patient ID: Lindsay French, female    DOB: 01-23-1981, 32 y.o.   MRN: [id number]  HPI She comes in here for a work in visit. She had initially called to to a broken tooth and pain in her gum and she was concerned with infection however this has since resolved.  She went to the urgent care Center yesterday and was diagnosed with a URI versus influenza. She comes in here as a work in visit to 2 persistent symptoms including fever and nasal congestion.  She also has not had recent labs   Review of Systems  Constitutional: Positive for fever. Negative for chills and activity change.  HENT: Positive for sore throat and postnasal drip. Negative for trouble swallowing.   Respiratory: Negative for cough and shortness of breath.   Gastrointestinal: Negative for nausea and abdominal pain.  Skin: Negative for rash.  Neurological: Negative for dizziness.       Objective:   Physical Exam  Constitutional: She appears well-developed and well-nourished. No distress.  HENT:       She has pharyngeal erythema  Cardiovascular: Normal rate, regular rhythm and normal heart sounds.  Exam reveals no gallop and no friction rub.   No murmur heard. Pulmonary/Chest: Effort normal and breath sounds normal. No respiratory distress. She has no wheezes. She has no rales.  Lymphadenopathy:    She has no cervical adenopathy.          Assessment & Plan:

## 2012-05-21 ENCOUNTER — Telehealth: Payer: Self-pay | Admitting: *Deleted

## 2012-05-21 NOTE — Telephone Encounter (Signed)
Patient called wanting to know what decongestant to take for her nasal congestion, asking if this was something that needs to be called in.  RN advised her that decongestants are available OTC and to ask the pharmacist at [facility] (where she fills her HIV meds) what would work.  It is noted that she may need allergy medication once she recovers from this URI.  RN encouraged patient to come to her lab and follow up visits sop that the doctors could prescribe medication that would best suit her medical and financial needs.  Patient verbalized understanding. Andree Coss, RN

## 2012-05-23 ENCOUNTER — Other Ambulatory Visit: Payer: Self-pay

## 2012-06-24 ENCOUNTER — Encounter (HOSPITAL_COMMUNITY): Payer: Self-pay | Admitting: Emergency Medicine

## 2012-06-24 ENCOUNTER — Emergency Department (HOSPITAL_COMMUNITY)
Admission: EM | Admit: 2012-06-24 | Discharge: 2012-06-24 | Disposition: A | Discharge status: Home / Self Care | Payer: Self-pay | Attending: Emergency Medicine | Admitting: Emergency Medicine

## 2012-06-24 ENCOUNTER — Emergency Department (HOSPITAL_COMMUNITY): Payer: Self-pay

## 2012-06-24 DIAGNOSIS — Z79899 Other long term (current) drug therapy: Secondary | ICD-10-CM | POA: Insufficient documentation

## 2012-06-24 DIAGNOSIS — Y929 Unspecified place or not applicable: Secondary | ICD-10-CM | POA: Insufficient documentation

## 2012-06-24 DIAGNOSIS — S93402A Sprain of unspecified ligament of left ankle, initial encounter: Secondary | ICD-10-CM

## 2012-06-24 DIAGNOSIS — W010XXA Fall on same level from slipping, tripping and stumbling without subsequent striking against object, initial encounter: Secondary | ICD-10-CM | POA: Insufficient documentation

## 2012-06-24 DIAGNOSIS — Y9329 Activity, other involving ice and snow: Secondary | ICD-10-CM | POA: Insufficient documentation

## 2012-06-24 DIAGNOSIS — Z21 Asymptomatic human immunodeficiency virus [HIV] infection status: Secondary | ICD-10-CM | POA: Insufficient documentation

## 2012-06-24 DIAGNOSIS — S93409A Sprain of unspecified ligament of unspecified ankle, initial encounter: Secondary | ICD-10-CM | POA: Insufficient documentation

## 2012-06-24 DIAGNOSIS — Z87891 Personal history of nicotine dependence: Secondary | ICD-10-CM | POA: Insufficient documentation

## 2012-06-24 IMAGING — CR DG ANKLE COMPLETE 3+V*L*
3 series · 3 of 3 positions shown · non-contrast
Comparison: None.

CLINICAL DATA: Fall with diffuse pain.

LEFT ANKLE COMPLETE - 3+ VIEW

[x ankle ap left]
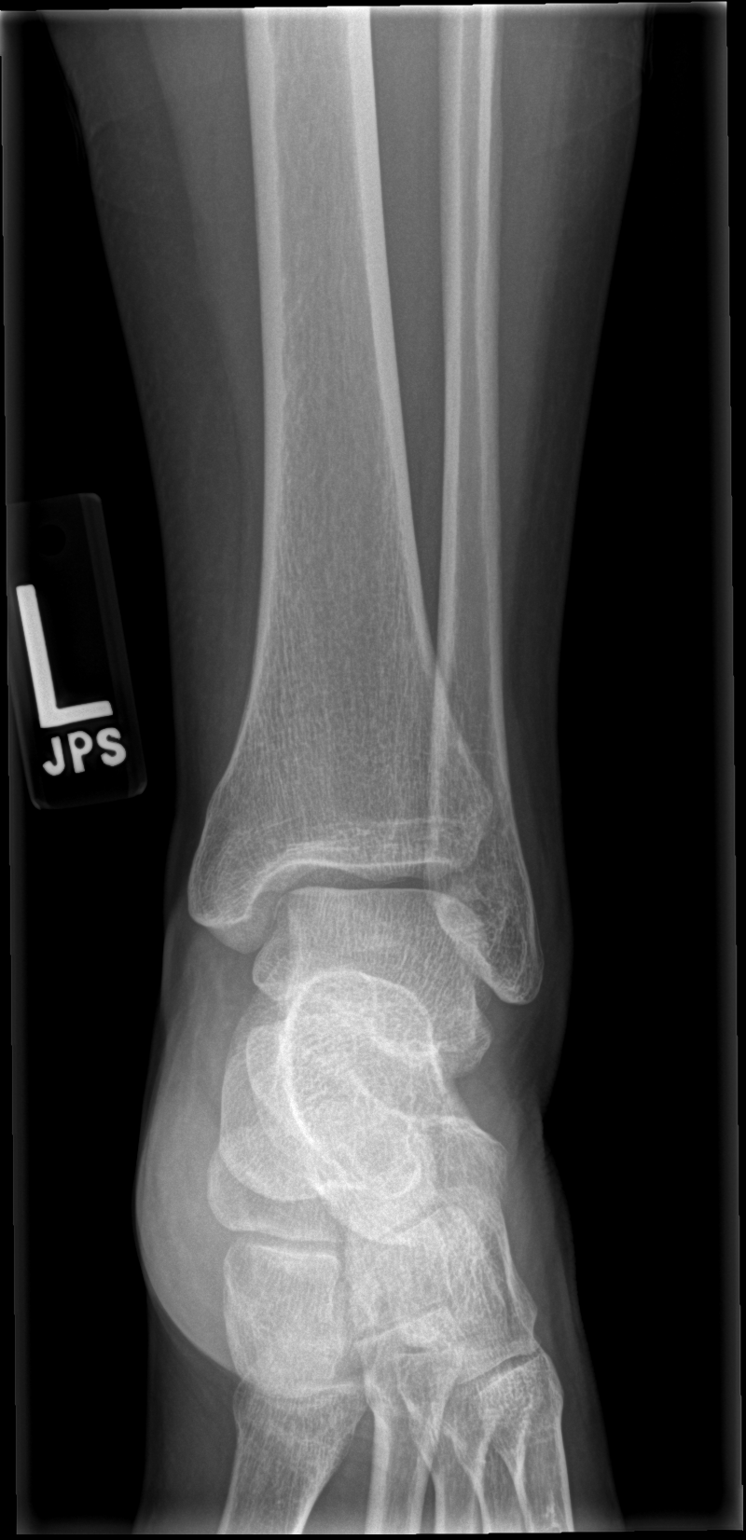

[x ankle obl left]
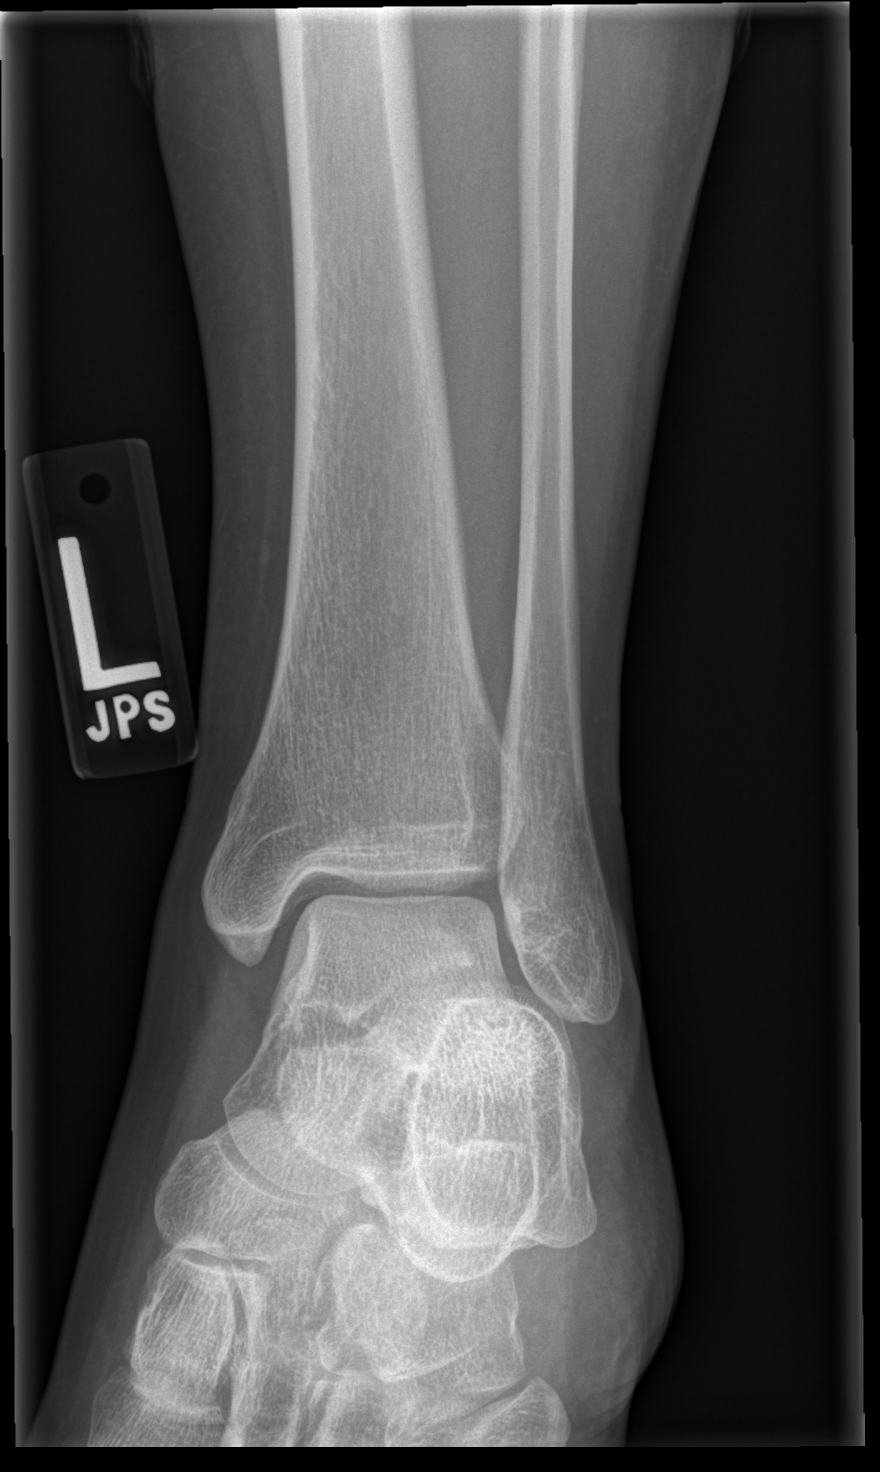

[x ankle lat left]
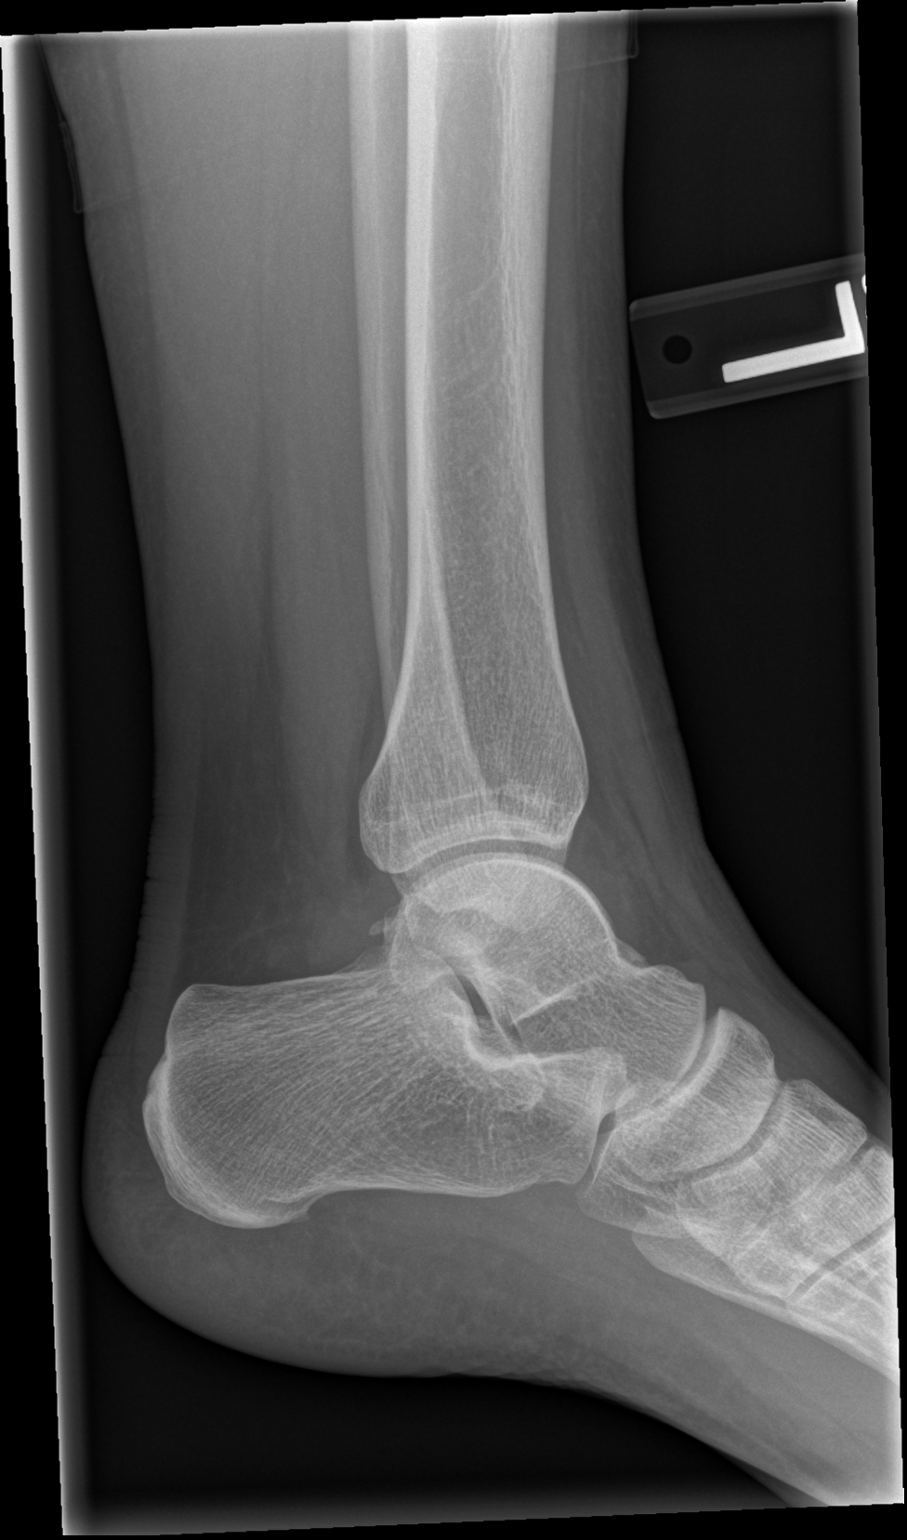

[3 of 3 positions shown; findings below may reference images not displayed]

FINDINGS: Bimalleolar soft tissue swelling. No acute fracture or
dislocation.  Base of fifth metatarsal and talar dome intact.
IMPRESSION: Soft tissue swelling, without acute finding.

## 2012-06-24 MED ORDER — HYDROCODONE-ACETAMINOPHEN 5-325 MG PO TABS: 2.0000 | ORAL_TABLET | Freq: Four times a day (QID) | ORAL | Status: DC | PRN

## 2012-06-24 NOTE — ED Provider Notes (Signed)
Medical screening examination/treatment/procedure(s) were performed by non-physician practitioner and as supervising physician I was immediately available for consultation/collaboration.  Evelise Reine, MD 06/24/12 1452 

## 2012-06-24 NOTE — Progress Notes (Signed)
Orthopedic Tech Progress Note Patient Details:  Lindsay French [birth year] [id number] ASO and crutches applied to Left LE Ortho Devices Type of Ortho Device: ASO;Crutches Ortho Device/Splint Interventions: Application   Asia R Thompson 06/24/2012, 10:51 AM

## 2012-06-24 NOTE — ED Provider Notes (Signed)
History     CSN: [id number]  Arrival date & time 06/24/12  2841   First MD Initiated Contact with Patient 06/24/12 [phone]      Chief Complaint  Patient presents with  . Ankle Injury    (Consider location/radiation/quality/duration/timing/severity/associated sxs/prior treatment) HPI Comments: This is a 32 year old female, past medical history remarkable for HIV, who presents emergency department with chief complaint of left ankle pain. Patient states that she slipped and fell on the ice this morning. She reports hearing and feeling a pop. She states the pain is moderate in severity. She states that it is worsened with weightbearing. She has not tried anything to alleviate her symptoms the pain radiates to her shin.  The history is provided by the patient. No language interpreter was used.    Past Medical History  Diagnosis Date  . HIV (human immunodeficiency virus infection)     Past Surgical History  Procedure Laterality Date  . Maxillofacial surgery      Family History  Problem Relation Age of Onset  . Hypothyroidism Mother     History  Substance Use Topics  . Smoking status: Former Smoker -- 0.30 packs/day for 15 years    Types: Cigarettes  . Smokeless tobacco: Never Used  . Alcohol Use: 4.2 oz/week    7 Glasses of wine per week     Comment: wine    OB History   Grav Para Term Preterm Abortions TAB SAB Ect Mult Living                  Review of Systems  All other systems reviewed and are negative.    Allergies  Review of patient's allergies indicates no known allergies.  Home Medications   Current Outpatient Rx  Name  Route  Sig  Dispense  Refill  . Darunavir Ethanolate (PREZISTA) 800 MG tablet   Oral   Take 1 tablet (800 mg total) by mouth daily.   30 tablet   5   . emtricitabine-tenofovir (TRUVADA) 200-300 MG per tablet   Oral   Take 1 tablet by mouth daily.   30 tablet   5   . guaifenesin (ROBITUSSIN) 100 MG/5ML syrup   Oral   Take 200 mg  by mouth 3 (three) times daily as needed for cough.         . ritonavir (NORVIR) 100 MG TABS   Oral   Take 1 tablet (100 mg total) by mouth daily. With Prezista.   30 tablet   5     BP 144/98  Pulse 85  Temp(Src) 97.1 F (36.2 C) (Oral)  Resp 18  SpO2 96%  LMP 06/19/2012  Physical Exam  Nursing note and vitals reviewed. Constitutional: She is oriented to person, place, and time. She appears well-developed and well-nourished.  HENT:  Head: Normocephalic and atraumatic.  Eyes: Conjunctivae and EOM are normal.  Neck: Normal range of motion. Neck supple.  Cardiovascular: Normal rate, regular rhythm and intact distal pulses.   Strong distal pulses, and brisk capillary refill  Pulmonary/Chest: Effort normal and breath sounds normal. No respiratory distress.  Abdominal: Soft. She exhibits no distension.  Musculoskeletal: Normal range of motion. She exhibits no edema and no tenderness.  Left ankle mildly swollen, no bony tenderness to palpation, mild tenderness over the deltoid ligament, range of motion is limited secondary to pain, strength deferred secondary to pain  Neurological: She is alert and oriented to person, place, and time.  Skin: Skin is warm and dry.  Psychiatric: She has a normal mood and affect. Her behavior is normal. Judgment and thought content normal.    ED Course  Procedures (including critical care time)  Dg Ankle Complete Left  06/24/2012  *RADIOLOGY REPORT*  Clinical Data: Fall with diffuse pain.  LEFT ANKLE COMPLETE - 3+ VIEW  Comparison: None.  Findings: Bimalleolar soft tissue swelling. No acute fracture or dislocation.  Base of fifth metatarsal and talar dome intact.  IMPRESSION: Soft tissue swelling, without acute finding.   Original Report Authenticated By: Jeronimo Greaves, M.D.       1. Ankle sprain, left, initial encounter       MDM  32 year old female with left ankle pain. I suspect this likely sprain, however I will order plain films rule out  fracture.   Plain films are negative, I will give the patient an ASO ankle brace, crutches, and will have the patient followup with orthopedics. Will discharge with some pain medicine and rice therapy. Patient understands and agrees with the plan. She is stable and ready for discharge.        Roxy Horseman, PA-C 06/24/12 1036

## 2012-06-24 NOTE — ED Notes (Signed)
Ortho paged for splinting.
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
## 2012-06-25 ENCOUNTER — Telehealth: Payer: Self-pay | Admitting: Licensed Clinical Social Worker

## 2012-06-25 NOTE — Telephone Encounter (Signed)
Patient called requesting additional pain medication for a sprained ankle. She went to the ED and was given 5 vicodin until she could see the orthopedics on Friday. She was crying and very upset because she wanted more pain medication. I explained to her that we would not refill her Vicodin and she should try 800 mg ibuprofen and ice. I also suggested her to call the Orthopedics office to see if they could see her early or to call [facility] for a same day appointment. Patient said she would try calling.

## 2012-07-04 ENCOUNTER — Other Ambulatory Visit: Payer: Self-pay | Admitting: *Deleted

## 2012-07-04 DIAGNOSIS — B20 Human immunodeficiency virus [HIV] disease: Secondary | ICD-10-CM

## 2012-07-04 MED ORDER — DARUNAVIR ETHANOLATE 800 MG PO TABS: 800.0000 mg | ORAL_TABLET | Freq: Every day | ORAL | Status: DC

## 2012-07-04 MED ORDER — RITONAVIR 100 MG PO TABS: 100.0000 mg | ORAL_TABLET | Freq: Every day | ORAL | Status: DC

## 2012-07-04 MED ORDER — EMTRICITABINE-TENOFOVIR DF 200-300 MG PO TABS: 1.0000 | ORAL_TABLET | Freq: Every day | ORAL | Status: DC

## 2012-07-09 ENCOUNTER — Other Ambulatory Visit (INDEPENDENT_AMBULATORY_CARE_PROVIDER_SITE_OTHER): Payer: Self-pay

## 2012-07-09 DIAGNOSIS — B20 Human immunodeficiency virus [HIV] disease: Secondary | ICD-10-CM

## 2012-07-09 LAB — COMPLETE METABOLIC PANEL WITH GFR
ALT: 26 U/L (ref 0–35)
AST: 18 U/L (ref 0–37)
Albumin: 4.7 g/dL (ref 3.5–5.2)
Alkaline Phosphatase: 124 U/L — ABNORMAL HIGH (ref 39–117)
BUN: 11 mg/dL (ref 6–23)
CO2: 25 mEq/L (ref 19–32)
Calcium: 9.9 mg/dL (ref 8.4–10.5)
Chloride: 104 mEq/L (ref 96–112)
Creat: 0.68 mg/dL (ref 0.50–1.10)
GFR, Est African American: >89 mL/min
GFR, Est Non African American: >89 mL/min
Glucose, Bld: 101 mg/dL — ABNORMAL HIGH (ref 70–99)
Potassium: 4.5 mEq/L (ref 3.5–5.3)
Sodium: 139 mEq/L (ref 135–145)
Total Bilirubin: 0.3 mg/dL (ref 0.3–1.2)
Total Protein: 7.1 g/dL (ref 6.0–8.3)

## 2012-07-09 LAB — CBC WITH DIFFERENTIAL/PLATELET
Basophils Absolute: 0.1 10*3/uL (ref 0.0–0.1)
Basophils Relative: 1 % (ref 0–1)
Eosinophils Absolute: 0.3 10*3/uL (ref 0.0–0.7)
Eosinophils Relative: 3 % (ref 0–5)
HCT: 40.5 % (ref 36.0–46.0)
Hemoglobin: 14 g/dL (ref 12.0–15.0)
Lymphocytes Relative: 23 % (ref 12–46)
Lymphs Abs: 2.6 10*3/uL (ref 0.7–4.0)
MCH: 32.5 pg (ref 26.0–34.0)
MCHC: 34.6 g/dL (ref 30.0–36.0)
MCV: 94 fL (ref 78.0–100.0)
Monocytes Absolute: 0.9 10*3/uL (ref 0.1–1.0)
Monocytes Relative: 8 % (ref 3–12)
Neutro Abs: 7.2 10*3/uL (ref 1.7–7.7)
Neutrophils Relative %: 65 % (ref 43–77)
Platelets: 308 10*3/uL (ref 150–400)
RBC: 4.31 MIL/uL (ref 3.87–5.11)
RDW: 13.2 % (ref 11.5–15.5)
WBC: 11 10*3/uL — ABNORMAL HIGH (ref 4.0–10.5)

## 2012-07-10 LAB — T-HELPER CELL (CD4) - (RCID CLINIC ONLY)
CD4 % Helper T Cell: 43 % (ref 33–55)
CD4 T Cell Abs: 1110 uL (ref 400–2700)

## 2012-07-10 LAB — HIV-1 RNA QUANT-NO REFLEX-BLD
HIV 1 RNA Quant: 27 copies/mL — ABNORMAL HIGH (ref <20)
HIV-1 RNA Quant, Log: 1.43 {Log} — ABNORMAL HIGH (ref <1.30)

## 2013-01-05 ENCOUNTER — Telehealth: Payer: Self-pay

## 2013-01-05 ENCOUNTER — Other Ambulatory Visit: Payer: Self-pay | Admitting: Internal Medicine

## 2013-01-05 MED ORDER — DARUNAVIR ETHANOLATE 800 MG PO TABS: 800.0000 mg | ORAL_TABLET | Freq: Every day | ORAL | Status: DC

## 2013-01-05 MED ORDER — RITONAVIR 100 MG PO TABS: ORAL_TABLET | ORAL | Status: DC

## 2013-01-05 MED ORDER — EMTRICITABINE-TENOFOVIR DF 200-300 MG PO TABS: ORAL_TABLET | ORAL | Status: DC

## 2013-01-05 NOTE — Telephone Encounter (Signed)
Medications sent to pharmacy per request.  Laurell Josephs, RN

## 2013-01-16 ENCOUNTER — Emergency Department (HOSPITAL_COMMUNITY)
Admission: EM | Admit: 2013-01-16 | Discharge: 2013-01-16 | Disposition: A | Discharge status: Home / Self Care | Payer: No Typology Code available for payment source | Attending: Emergency Medicine | Admitting: Emergency Medicine

## 2013-01-16 ENCOUNTER — Encounter (HOSPITAL_COMMUNITY): Payer: Self-pay

## 2013-01-16 ENCOUNTER — Emergency Department (HOSPITAL_COMMUNITY): Payer: No Typology Code available for payment source

## 2013-01-16 DIAGNOSIS — M545 Low back pain, unspecified: Secondary | ICD-10-CM | POA: Insufficient documentation

## 2013-01-16 DIAGNOSIS — Z87891 Personal history of nicotine dependence: Secondary | ICD-10-CM | POA: Insufficient documentation

## 2013-01-16 DIAGNOSIS — Z79899 Other long term (current) drug therapy: Secondary | ICD-10-CM | POA: Insufficient documentation

## 2013-01-16 DIAGNOSIS — Z21 Asymptomatic human immunodeficiency virus [HIV] infection status: Secondary | ICD-10-CM | POA: Insufficient documentation

## 2013-01-16 DIAGNOSIS — M25531 Pain in right wrist: Secondary | ICD-10-CM

## 2013-01-16 DIAGNOSIS — M549 Dorsalgia, unspecified: Secondary | ICD-10-CM

## 2013-01-16 DIAGNOSIS — M25539 Pain in unspecified wrist: Secondary | ICD-10-CM | POA: Insufficient documentation

## 2013-01-16 DIAGNOSIS — Y92410 Unspecified street and highway as the place of occurrence of the external cause: Secondary | ICD-10-CM | POA: Insufficient documentation

## 2013-01-16 DIAGNOSIS — S335XXA Sprain of ligaments of lumbar spine, initial encounter: Secondary | ICD-10-CM | POA: Insufficient documentation

## 2013-01-16 DIAGNOSIS — Y9389 Activity, other specified: Secondary | ICD-10-CM | POA: Insufficient documentation
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
## 2013-01-16 MED ORDER — CYCLOBENZAPRINE HCL 10 MG PO TABS: 10.0000 mg | ORAL_TABLET | Freq: Two times a day (BID) | ORAL | Status: DC | PRN

## 2013-01-16 MED ORDER — CYCLOBENZAPRINE HCL 10 MG PO TABS
10.0000 mg | ORAL_TABLET | Freq: Once | ORAL | Status: AC
  Administered 2013-01-16: 10 mg via ORAL
  Filled 2013-01-16: qty 1

## 2013-01-16 NOTE — ED Notes (Signed)
Pt escorted to discharge window. Pt verbalized understanding discharge instructions. In no acute distress.  

## 2013-01-16 NOTE — Progress Notes (Signed)
[facility] provided pt with a list of primary care resources.  °
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
## 2013-01-17 NOTE — ED Provider Notes (Signed)
Medical screening examination/treatment/procedure(s) were performed by non-physician practitioner and as supervising physician I was immediately available for consultation/collaboration.   Brandon Scarbrough L [place], MD 01/17/13 1117 

## 2013-02-26 ENCOUNTER — Other Ambulatory Visit: Payer: Self-pay | Admitting: *Deleted

## 2013-02-26 ENCOUNTER — Telehealth: Payer: Self-pay | Admitting: *Deleted

## 2013-02-26 ENCOUNTER — Ambulatory Visit (INDEPENDENT_AMBULATORY_CARE_PROVIDER_SITE_OTHER): Payer: Self-pay | Admitting: Internal Medicine

## 2013-02-26 ENCOUNTER — Encounter: Payer: Self-pay | Admitting: Internal Medicine

## 2013-02-26 VITALS — BP 136/90 | HR 105 | Temp 98.4°F | Ht 63.0 in | Wt 197.0 lb

## 2013-02-26 DIAGNOSIS — B20 Human immunodeficiency virus [HIV] disease: Secondary | ICD-10-CM

## 2013-02-26 DIAGNOSIS — J069 Acute upper respiratory infection, unspecified: Secondary | ICD-10-CM

## 2013-02-26 DIAGNOSIS — Z79899 Other long term (current) drug therapy: Secondary | ICD-10-CM

## 2013-02-26 DIAGNOSIS — Z113 Encounter for screening for infections with a predominantly sexual mode of transmission: Secondary | ICD-10-CM

## 2013-02-26 LAB — LIPID PANEL
Cholesterol: 193 mg/dL (ref 0–200)
HDL: 48 mg/dL (ref >39)
LDL Cholesterol: 108 mg/dL — ABNORMAL HIGH (ref 0–99)
Total CHOL/HDL Ratio: 4 Ratio
Triglycerides: 187 mg/dL — ABNORMAL HIGH (ref <150)
VLDL: 37 mg/dL (ref 0–40)

## 2013-02-26 LAB — CBC WITH DIFFERENTIAL/PLATELET
Basophils Absolute: 0 10*3/uL (ref 0.0–0.1)
Basophils Relative: 1 % (ref 0–1)
Eosinophils Absolute: 0.3 10*3/uL (ref 0.0–0.7)
Eosinophils Relative: 4 % (ref 0–5)
HCT: 38.4 % (ref 36.0–46.0)
Hemoglobin: 13.3 g/dL (ref 12.0–15.0)
Lymphocytes Relative: 14 % (ref 12–46)
Lymphs Abs: 1 10*3/uL (ref 0.7–4.0)
MCH: 32.8 pg (ref 26.0–34.0)
MCHC: 34.6 g/dL (ref 30.0–36.0)
MCV: 94.6 fL (ref 78.0–100.0)
Monocytes Absolute: 0.7 10*3/uL (ref 0.1–1.0)
Monocytes Relative: 9 % (ref 3–12)
Neutro Abs: 5.5 10*3/uL (ref 1.7–7.7)
Neutrophils Relative %: 72 % (ref 43–77)
Platelets: 280 10*3/uL (ref 150–400)
RBC: 4.06 MIL/uL (ref 3.87–5.11)
RDW: 13.7 % (ref 11.5–15.5)
WBC: 7.5 10*3/uL (ref 4.0–10.5)

## 2013-02-26 LAB — COMPLETE METABOLIC PANEL WITH GFR
ALT: 41 U/L — ABNORMAL HIGH (ref 0–35)
AST: 37 U/L (ref 0–37)
Albumin: 4.1 g/dL (ref 3.5–5.2)
Alkaline Phosphatase: 120 U/L — ABNORMAL HIGH (ref 39–117)
BUN: 9 mg/dL (ref 6–23)
CO2: 26 mEq/L (ref 19–32)
Calcium: 9.3 mg/dL (ref 8.4–10.5)
Chloride: 101 mEq/L (ref 96–112)
Creat: 0.67 mg/dL (ref 0.50–1.10)
GFR, Est African American: >89 mL/min
GFR, Est Non African American: >89 mL/min
Glucose, Bld: 102 mg/dL — ABNORMAL HIGH (ref 70–99)
Potassium: 4 mEq/L (ref 3.5–5.3)
Sodium: 135 mEq/L (ref 135–145)
Total Bilirubin: 0.3 mg/dL (ref 0.3–1.2)
Total Protein: 6.9 g/dL (ref 6.0–8.3)

## 2013-02-26 MED ORDER — RITONAVIR 100 MG PO TABS: ORAL_TABLET | ORAL | Status: DC

## 2013-02-26 MED ORDER — DARUNAVIR ETHANOLATE 800 MG PO TABS: 800.0000 mg | ORAL_TABLET | Freq: Every day | ORAL | Status: DC

## 2013-02-26 MED ORDER — BENZONATATE 200 MG PO CAPS: 200.0000 mg | ORAL_CAPSULE | Freq: Three times a day (TID) | ORAL | Status: DC | PRN

## 2013-02-26 MED ORDER — EMTRICITABINE-TENOFOVIR DF 200-300 MG PO TABS: ORAL_TABLET | ORAL | Status: DC

## 2013-02-26 NOTE — Telephone Encounter (Signed)
Patient called requesting appointment for cold symptoms.  Pt has not been following up with her physician, ok to overbook today per Dr Luciana Axe. Andree Coss, RN

## 2013-02-27 ENCOUNTER — Telehealth: Payer: Self-pay | Admitting: *Deleted

## 2013-02-27 LAB — T-HELPER CELL (CD4) - (RCID CLINIC ONLY)
CD4 % Helper T Cell: 40 % (ref 33–55)
CD4 T Cell Abs: 470 /uL (ref 400–2700)

## 2013-02-27 LAB — RPR

## 2013-02-27 NOTE — Progress Notes (Signed)
  Subjective:    Patient ID: Lindsay French, female    DOB: [birth year], 32 y.o.   MRN: [id number]  HPI Work in visit.  Has HIV on Prezista, norvir, Truvada.  Sporadic follow up but endorses good compliance.  ADAP about to expire.  Last seen January 2014 for URI symptoms.  Here today for URI symptoms.  Complaint of fatigue, muscle aches, cough, congestion.  Has been prolonged.  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  Took amoxicillin and helped some but cold returned.  Coughs at night and has a difficult time sleeping.  No labs since January.  No weight loss, no diarrhea.     Review of Systems  Constitutional: Positive for fatigue. Negative for fever, chills and appetite change.  HENT: Positive for congestion, postnasal drip, sinus pressure, sneezing and sore throat.   Eyes: Negative for visual disturbance.  Respiratory: Positive for cough. Negative for shortness of breath and wheezing.   Cardiovascular: Negative for leg swelling.  Gastrointestinal: Negative for nausea, abdominal pain and diarrhea.  Musculoskeletal: Positive for myalgias.  Skin: Negative for rash.  Neurological: Negative for dizziness, light-headedness and headaches.  Hematological: Negative for adenopathy.  Psychiatric/Behavioral: Negative for dysphoric mood.       Objective:   Physical Exam  Constitutional: She appears well-developed and well-nourished. No distress.  HENT:  Mouth/Throat: No oropharyngeal exudate.  Eyes: Right eye exhibits no discharge. Left eye exhibits no discharge. No scleral icterus.  Cardiovascular: Normal rate, regular rhythm and normal heart sounds.   No murmur heard. Pulmonary/Chest: Effort normal and breath sounds normal. No respiratory distress.  Lymphadenopathy:    She has no cervical adenopathy.  Neurological: She is alert.  Skin: Skin is warm and dry. No rash noted.  Psychiatric: She has a normal mood and affect. Her behavior is normal.          Assessment & Plan:

## 2013-02-27 NOTE — Assessment & Plan Note (Signed)
Lindsay French again. Supportive care.

## 2013-02-27 NOTE — Assessment & Plan Note (Signed)
Will recheck labs today, if ok, rtc in 6 months.

## 2013-02-27 NOTE — Telephone Encounter (Signed)
Patient called and advised she is still coughing and not able to sleep. She advised the Tessalon that she was given did not work and she wants something else. Asked her about her other symptoms and she advised no fever, chills, mucus, body aches or night sweats just the nagging cough. Advised her that her doctor is in clinic this morning and I will ask him if there is anything else he would recommend and get back to her. Asked Dr Luciana Axe and he advised her to take an antihistamine and decongestant and keep hydrated and that it will have to run its course as it is a cold and should run through soon. Informed the patient of this and she asked about cough syrup with codeine in it and I reminded her she has an allergy to codeine so we can not give her anything like that and she advised she will just have to deal with it til it runs its course.

## 2013-03-02 LAB — HIV-1 RNA QUANT-NO REFLEX-BLD
HIV 1 RNA Quant: <20 copies/mL (ref <20)
HIV-1 RNA Quant, Log: <1.3 {Log} (ref <1.30)

## 2013-03-18 ENCOUNTER — Other Ambulatory Visit: Payer: Self-pay | Admitting: *Deleted

## 2013-03-18 DIAGNOSIS — B20 Human immunodeficiency virus [HIV] disease: Secondary | ICD-10-CM

## 2013-03-18 MED ORDER — RITONAVIR 100 MG PO TABS: ORAL_TABLET | ORAL | Status: DC

## 2013-03-18 MED ORDER — EMTRICITABINE-TENOFOVIR DF 200-300 MG PO TABS: ORAL_TABLET | ORAL | Status: DC

## 2013-03-18 MED ORDER — DARUNAVIR ETHANOLATE 800 MG PO TABS: 800.0000 mg | ORAL_TABLET | Freq: Every day | ORAL | Status: DC

## 2013-04-07 ENCOUNTER — Encounter: Payer: Self-pay | Admitting: *Deleted

## 2013-07-08 ENCOUNTER — Encounter: Payer: Self-pay | Admitting: *Deleted

## 2013-07-20 ENCOUNTER — Encounter: Payer: Self-pay | Admitting: *Deleted

## 2013-08-13 ENCOUNTER — Other Ambulatory Visit: Payer: Self-pay | Admitting: Licensed Clinical Social Worker

## 2013-08-13 ENCOUNTER — Other Ambulatory Visit: Payer: Self-pay | Admitting: *Deleted

## 2013-08-13 DIAGNOSIS — B20 Human immunodeficiency virus [HIV] disease: Secondary | ICD-10-CM

## 2013-08-13 MED ORDER — DARUNAVIR ETHANOLATE 800 MG PO TABS: 800.0000 mg | ORAL_TABLET | Freq: Every day | ORAL | Status: DC

## 2013-08-13 MED ORDER — EMTRICITABINE-TENOFOVIR DF 200-300 MG PO TABS: ORAL_TABLET | ORAL | Status: DC

## 2013-08-13 MED ORDER — RITONAVIR 100 MG PO TABS: ORAL_TABLET | ORAL | Status: DC

## 2013-08-20 ENCOUNTER — Telehealth: Payer: Self-pay | Admitting: *Deleted

## 2013-08-20 NOTE — Telephone Encounter (Signed)
[place] call from [place] today.  She said she applied for ADAP about 2 weeks ago and is now out of medication.  She is on Norvir, Prezista and Truvada.  I told her I doubted if I could get her any medication before she hears from ADAP.  I checked to see if we has any of the meds she needs.  I could not find any here.  I called her back and told her we do not have any of her medication here.  I talked with the nurses and was told she should be fine until her ADAP is approved since she is undetectable.

## 2013-09-28 ENCOUNTER — Other Ambulatory Visit: Payer: Self-pay | Admitting: Internal Medicine

## 2013-09-29 ENCOUNTER — Other Ambulatory Visit: Payer: Self-pay | Admitting: *Deleted

## 2013-09-29 DIAGNOSIS — B20 Human immunodeficiency virus [HIV] disease: Secondary | ICD-10-CM

## 2013-09-29 MED ORDER — EMTRICITABINE-TENOFOVIR DF 200-300 MG PO TABS: ORAL_TABLET | ORAL | Status: DC

## 2013-09-29 MED ORDER — DARUNAVIR ETHANOLATE 800 MG PO TABS: 800.0000 mg | ORAL_TABLET | Freq: Every day | ORAL | Status: DC

## 2013-09-29 MED ORDER — RITONAVIR 100 MG PO TABS: ORAL_TABLET | ORAL | Status: DC

## 2013-12-21 ENCOUNTER — Ambulatory Visit: Payer: Self-pay

## 2013-12-22 ENCOUNTER — Other Ambulatory Visit: Payer: Self-pay | Admitting: *Deleted

## 2013-12-22 DIAGNOSIS — B20 Human immunodeficiency virus [HIV] disease: Secondary | ICD-10-CM

## 2013-12-22 MED ORDER — DARUNAVIR ETHANOLATE 800 MG PO TABS: 800.0000 mg | ORAL_TABLET | Freq: Every day | ORAL | Status: DC

## 2013-12-22 MED ORDER — EMTRICITABINE-TENOFOVIR DF 200-300 MG PO TABS: ORAL_TABLET | ORAL | Status: DC

## 2013-12-22 MED ORDER — RITONAVIR 100 MG PO TABS: ORAL_TABLET | ORAL | Status: DC

## 2013-12-22 NOTE — Progress Notes (Signed)
ADAP 

## 2014-01-19 ENCOUNTER — Encounter: Payer: Self-pay | Admitting: *Deleted

## 2014-03-30 ENCOUNTER — Ambulatory Visit (INDEPENDENT_AMBULATORY_CARE_PROVIDER_SITE_OTHER): Payer: No Typology Code available for payment source | Admitting: *Deleted

## 2014-03-30 ENCOUNTER — Ambulatory Visit (INDEPENDENT_AMBULATORY_CARE_PROVIDER_SITE_OTHER): Payer: Self-pay | Admitting: Internal Medicine

## 2014-03-30 ENCOUNTER — Encounter: Payer: Self-pay | Admitting: Internal Medicine

## 2014-03-30 VITALS — BP 137/91 | HR 83 | Temp 98.2°F | Ht 63.0 in | Wt 195.0 lb

## 2014-03-30 DIAGNOSIS — Z113 Encounter for screening for infections with a predominantly sexual mode of transmission: Secondary | ICD-10-CM

## 2014-03-30 DIAGNOSIS — Z79899 Other long term (current) drug therapy: Secondary | ICD-10-CM

## 2014-03-30 DIAGNOSIS — B20 Human immunodeficiency virus [HIV] disease: Secondary | ICD-10-CM

## 2014-03-30 DIAGNOSIS — Z23 Encounter for immunization: Secondary | ICD-10-CM

## 2014-03-30 LAB — COMPLETE METABOLIC PANEL WITH GFR
ALT: 80 U/L — ABNORMAL HIGH (ref 0–35)
AST: 54 U/L — ABNORMAL HIGH (ref 0–37)
Albumin: 4 g/dL (ref 3.5–5.2)
Alkaline Phosphatase: 107 U/L (ref 39–117)
BUN: 9 mg/dL (ref 6–23)
CO2: 25 mEq/L (ref 19–32)
Calcium: 9.6 mg/dL (ref 8.4–10.5)
Chloride: 101 mEq/L (ref 96–112)
Creat: 0.63 mg/dL (ref 0.50–1.10)
GFR, Est African American: >89 mL/min
GFR, Est Non African American: >89 mL/min
Glucose, Bld: 93 mg/dL (ref 70–99)
Potassium: 4.6 mEq/L (ref 3.5–5.3)
Sodium: 136 mEq/L (ref 135–145)
Total Bilirubin: 0.4 mg/dL (ref 0.2–1.2)
Total Protein: 7.1 g/dL (ref 6.0–8.3)

## 2014-03-30 LAB — CBC WITH DIFFERENTIAL/PLATELET
Basophils Absolute: 0 10*3/uL (ref 0.0–0.1)
Basophils Relative: 0 % (ref 0–1)
Eosinophils Absolute: 0.2 10*3/uL (ref 0.0–0.7)
Eosinophils Relative: 2 % (ref 0–5)
HCT: 40.3 % (ref 36.0–46.0)
Hemoglobin: 13.8 g/dL (ref 12.0–15.0)
Lymphocytes Relative: 20 % (ref 12–46)
Lymphs Abs: 1.9 10*3/uL (ref 0.7–4.0)
MCH: 32.5 pg (ref 26.0–34.0)
MCHC: 34.2 g/dL (ref 30.0–36.0)
MCV: 95 fL (ref 78.0–100.0)
MPV: 10.6 fL (ref 9.4–12.4)
Monocytes Absolute: 0.7 10*3/uL (ref 0.1–1.0)
Monocytes Relative: 7 % (ref 3–12)
Neutro Abs: 6.7 10*3/uL (ref 1.7–7.7)
Neutrophils Relative %: 71 % (ref 43–77)
Platelets: 269 10*3/uL (ref 150–400)
RBC: 4.24 MIL/uL (ref 3.87–5.11)
RDW: 13.6 % (ref 11.5–15.5)
WBC: 9.5 10*3/uL (ref 4.0–10.5)

## 2014-03-30 LAB — LIPID PANEL
Cholesterol: 217 mg/dL — ABNORMAL HIGH (ref 0–200)
HDL: 51 mg/dL (ref >39)
LDL Cholesterol: 136 mg/dL — ABNORMAL HIGH (ref 0–99)
Total CHOL/HDL Ratio: 4.3 Ratio
Triglycerides: 149 mg/dL (ref <150)
VLDL: 30 mg/dL (ref 0–40)

## 2014-03-30 LAB — RPR

## 2014-03-30 MED ORDER — EMTRICITAB-RILPIVIR-TENOFOV DF 200-25-300 MG PO TABS: 1.0000 | ORAL_TABLET | Freq: Every day | ORAL | Status: DC

## 2014-03-30 NOTE — Progress Notes (Signed)
   Subjective:    Patient ID: Lindsay French, female    DOB: [birth year], 33 y.o.   MRN: [id number]  HPI Here for follow up of HIV.  Has not been seen in 1 year.  On Prezista, norvir and Truvada.  Did not renew ADAP in time in April and ran out of medications for a short time.  She otherwise has been on her medications with no missed doses. Good tolerance and no problems. She is getting insurance starting January 1. She is going to see an obstetrician as she is trying to get pregnant.   Review of Systems  Constitutional: Negative for fatigue.  HENT: Negative for trouble swallowing.   Gastrointestinal: Negative for nausea and diarrhea.  Skin: Negative for rash.  Neurological: Negative for dizziness and light-headedness.       Objective:   Physical Exam  Constitutional: She appears well-developed and well-nourished. No distress.  HENT:  Mouth/Throat: No oropharyngeal exudate.  Eyes: No scleral icterus.  Cardiovascular: Normal rate, regular rhythm and normal heart sounds.   No murmur heard. Pulmonary/Chest: Effort normal and breath sounds normal. No respiratory distress.  Lymphadenopathy:    She has no cervical adenopathy.  Skin: No rash noted.          Assessment & Plan:

## 2014-03-30 NOTE — Assessment & Plan Note (Signed)
She is doing well. Hopefully no issues with resistance after stopping and restarting. With her impending insurance and desire to get pregnant, I'm going to facilitate her regimen and start her on Complera. Co-pay card information was given. I will recheck her labs in 2 months. She will also get her labs today. I will then have her return in about 8 months which will be 6 months after her next lab visit. She will not be seen after her 2 month lab visit unless there are concerns. I counseled her on use of Complera and taking with a high fatty meal.  I also discussed with her PrEP for her husband and encouraged the use of this.

## 2014-03-31 LAB — T-HELPER CELL (CD4) - (RCID CLINIC ONLY)
CD4 % Helper T Cell: 46 % (ref 33–55)
CD4 T Cell Abs: 900 /uL (ref 400–2700)

## 2014-03-31 LAB — HIV-1 RNA QUANT-NO REFLEX-BLD
HIV 1 RNA Quant: <20 copies/mL (ref <20)
HIV-1 RNA Quant, Log: <1.3 {Log} (ref <1.30)

## 2014-04-06 LAB — HLA B*5701: HLA-B*5701 w/rflx HLA-B High: NEGATIVE

## 2014-05-31 ENCOUNTER — Other Ambulatory Visit: Payer: Self-pay | Admitting: *Deleted

## 2014-05-31 ENCOUNTER — Other Ambulatory Visit: Payer: 59

## 2014-05-31 DIAGNOSIS — B20 Human immunodeficiency virus [HIV] disease: Secondary | ICD-10-CM

## 2014-05-31 MED ORDER — EMTRICITAB-RILPIVIR-TENOFOV DF 200-25-300 MG PO TABS: 1.0000 | ORAL_TABLET | Freq: Every day | ORAL | Status: DC

## 2014-06-01 LAB — COMPLETE METABOLIC PANEL WITH GFR
ALT: 407 U/L — ABNORMAL HIGH (ref 0–35)
AST: 185 U/L — ABNORMAL HIGH (ref 0–37)
Albumin: 4.2 g/dL (ref 3.5–5.2)
Alkaline Phosphatase: 131 U/L — ABNORMAL HIGH (ref 39–117)
BUN: 6 mg/dL (ref 6–23)
CO2: 26 mEq/L (ref 19–32)
Calcium: 9.3 mg/dL (ref 8.4–10.5)
Chloride: 103 mEq/L (ref 96–112)
Creat: 0.64 mg/dL (ref 0.50–1.10)
GFR, Est African American: >89 mL/min
GFR, Est Non African American: >89 mL/min
Glucose, Bld: 109 mg/dL — ABNORMAL HIGH (ref 70–99)
Potassium: 4.5 mEq/L (ref 3.5–5.3)
Sodium: 139 mEq/L (ref 135–145)
Total Bilirubin: 0.3 mg/dL (ref 0.2–1.2)
Total Protein: 7 g/dL (ref 6.0–8.3)

## 2014-06-01 LAB — HIV-1 RNA QUANT-NO REFLEX-BLD
HIV 1 RNA Quant: <20 copies/mL (ref <20)
HIV-1 RNA Quant, Log: <1.3 {Log} (ref <1.30)

## 2014-06-01 LAB — T-HELPER CELL (CD4) - (RCID CLINIC ONLY)
CD4 % Helper T Cell: 42 % (ref 33–55)
CD4 T Cell Abs: 1140 /uL (ref 400–2700)

## 2014-06-02 ENCOUNTER — Other Ambulatory Visit: Payer: Self-pay | Admitting: *Deleted

## 2014-06-02 DIAGNOSIS — B20 Human immunodeficiency virus [HIV] disease: Secondary | ICD-10-CM

## 2014-06-02 MED ORDER — EMTRICITAB-RILPIVIR-TENOFOV DF 200-25-300 MG PO TABS: 1.0000 | ORAL_TABLET | Freq: Every day | ORAL | Status: DC

## 2014-06-29 ENCOUNTER — Ambulatory Visit (HOSPITAL_COMMUNITY): Payer: Self-pay | Admitting: Psychiatry

## 2014-07-01 ENCOUNTER — Encounter: Payer: Self-pay | Admitting: *Deleted

## 2014-07-12 ENCOUNTER — Emergency Department (HOSPITAL_COMMUNITY)
Admission: EM | Admit: 2014-07-12 | Discharge: 2014-07-12 | Discharge status: Against Medical Advice | Payer: 59 | Attending: Emergency Medicine | Admitting: Emergency Medicine

## 2014-07-12 ENCOUNTER — Encounter (HOSPITAL_COMMUNITY): Payer: Self-pay | Admitting: Emergency Medicine

## 2014-07-12 DIAGNOSIS — F410 Panic disorder [episodic paroxysmal anxiety] without agoraphobia: Secondary | ICD-10-CM | POA: Diagnosis not present

## 2014-07-12 HISTORY — DX: Anxiety disorder, unspecified: F41.9

## 2014-07-12 NOTE — ED Notes (Signed)
Pt sts taking celexa x 1 week and making her sleepy; pt sts started drinking caffeine to counteract which normally triggers panic attacks; pt sts panic attack started at 1000 and has taken some xanax but not helping

## 2014-07-14 ENCOUNTER — Other Ambulatory Visit (HOSPITAL_COMMUNITY): Payer: Self-pay | Admitting: Obstetrics and Gynecology

## 2014-07-14 DIAGNOSIS — N979 Female infertility, unspecified: Secondary | ICD-10-CM

## 2014-07-19 ENCOUNTER — Inpatient Hospital Stay (HOSPITAL_COMMUNITY): Admission: RE | Admit: 2014-07-19 | Payer: 59 | Source: Ambulatory Visit

## 2014-07-20 ENCOUNTER — Ambulatory Visit (HOSPITAL_COMMUNITY): Admission: RE | Admit: 2014-07-20 | Payer: 59 | Source: Ambulatory Visit

## 2014-10-11 ENCOUNTER — Ambulatory Visit (HOSPITAL_COMMUNITY)
Admission: RE | Admit: 2014-10-11 | Discharge: 2014-10-11 | Disposition: A | Discharge status: Home / Self Care | Payer: 59 | Source: Ambulatory Visit | Attending: Obstetrics and Gynecology | Admitting: Obstetrics and Gynecology

## 2014-10-11 DIAGNOSIS — N979 Female infertility, unspecified: Secondary | ICD-10-CM | POA: Diagnosis not present

## 2014-10-11 IMAGING — RF DG HYSTEROGRAM
6 series · 6 of 6 positions shown · IV contrast (omnipaque)
Comparison: None.

CLINICAL DATA: Primary infertility.

EXAM:
HYSTEROSALPINGOGRAM
TECHNIQUE: Following cleansing of the cervix and vagina with Betadine solution,
a hysterosalpingogram was performed using a 5-French
hysterosalpingogram catheter and Omnipaque 300 contrast. The patient
tolerated the examination without difficulty.

[Series 1: run · 1 of 1 slices shown (1 of 6)]
[im 1/1]
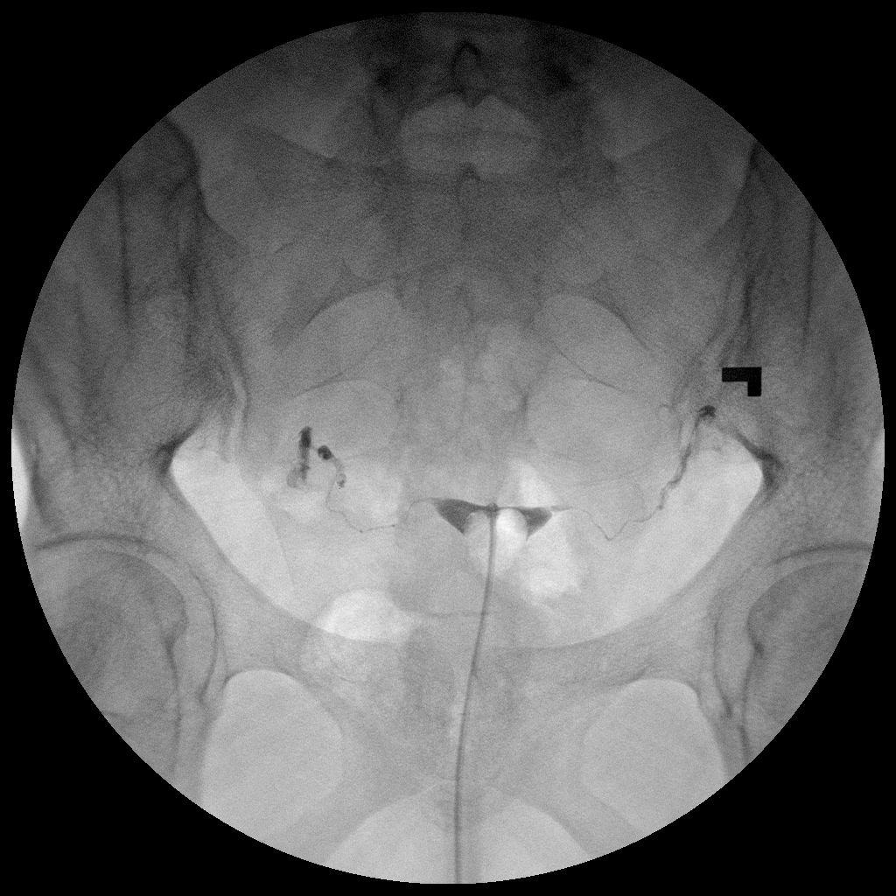

[Series 2: run · 1 of 1 slices shown (2 of 6)]
[im 1/1]
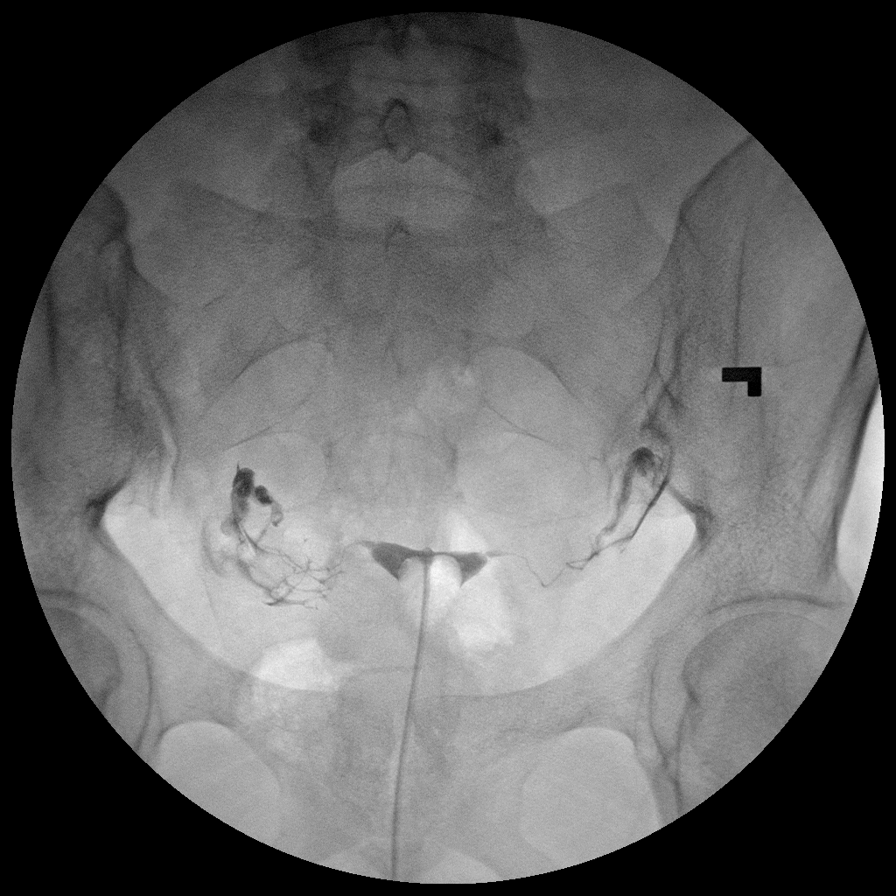

[Series 3: run · 1 of 1 slices shown (3 of 6)]
[im 1/1]
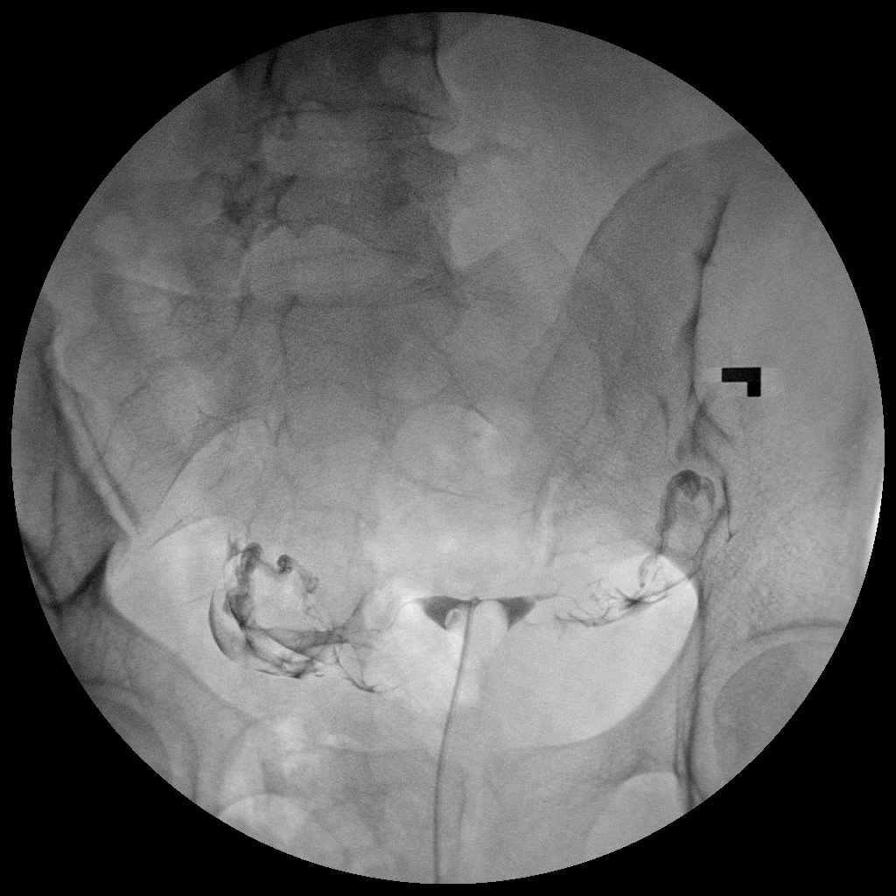

[Series 4: run · 1 of 1 slices shown (4 of 6)]
[im 1/1]
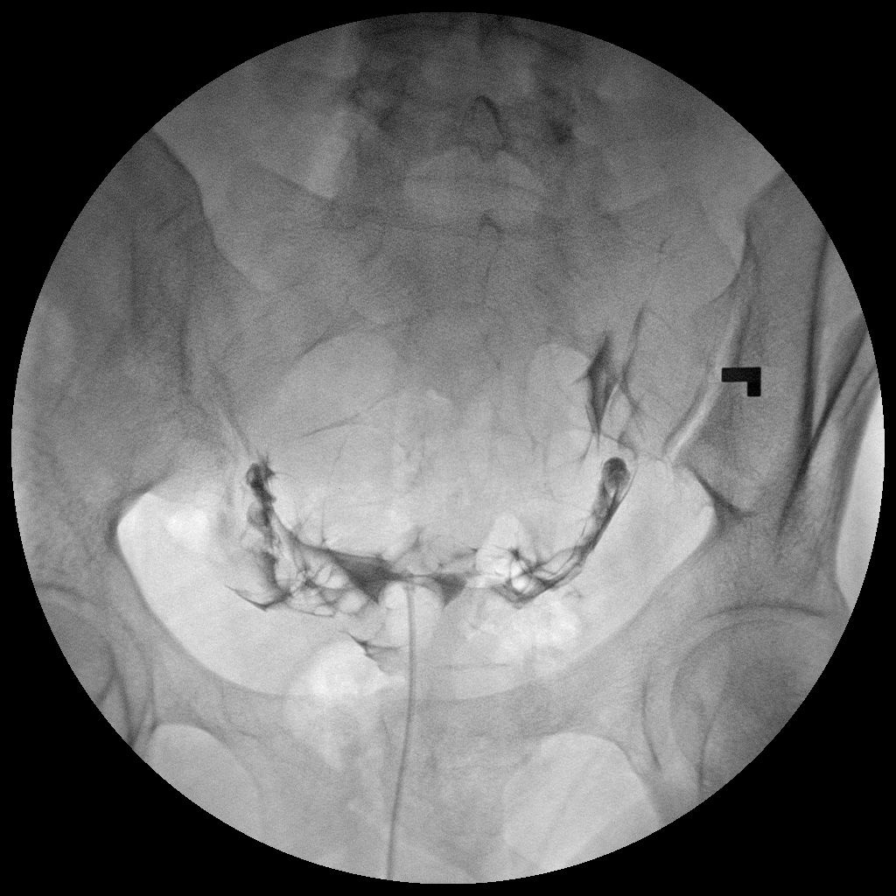

[Series 5: run · 1 of 1 slices shown (5 of 6)]
[im 1/1]
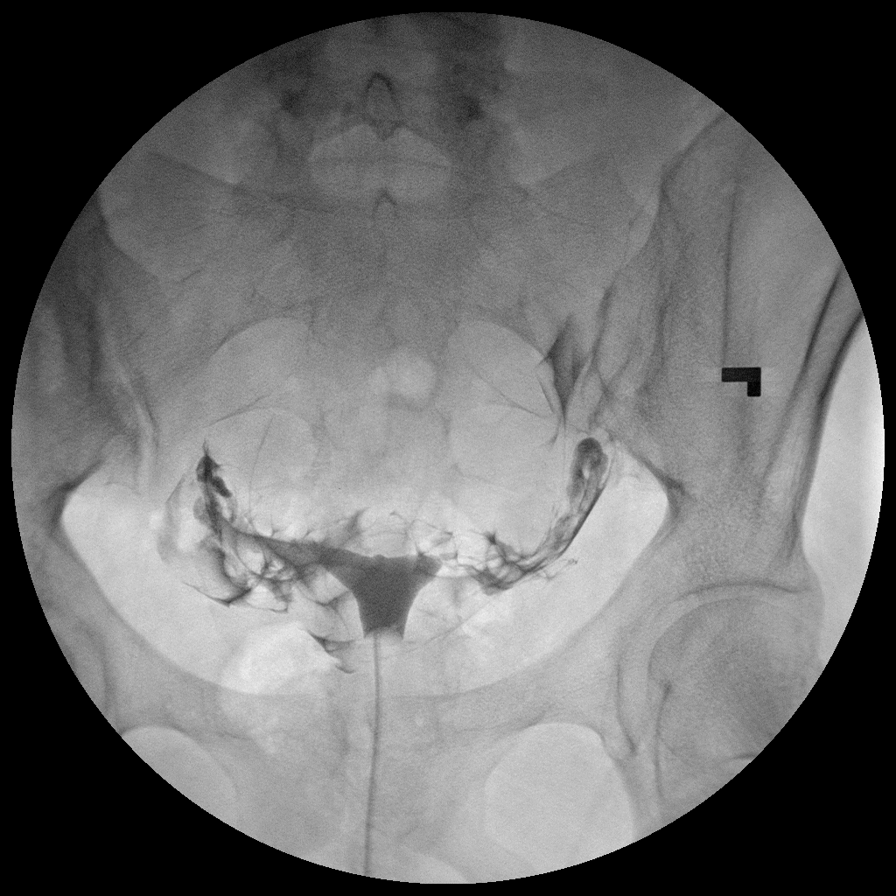

[Series 6: run · 1 of 1 slices shown (6 of 6)]
[im 1/1]
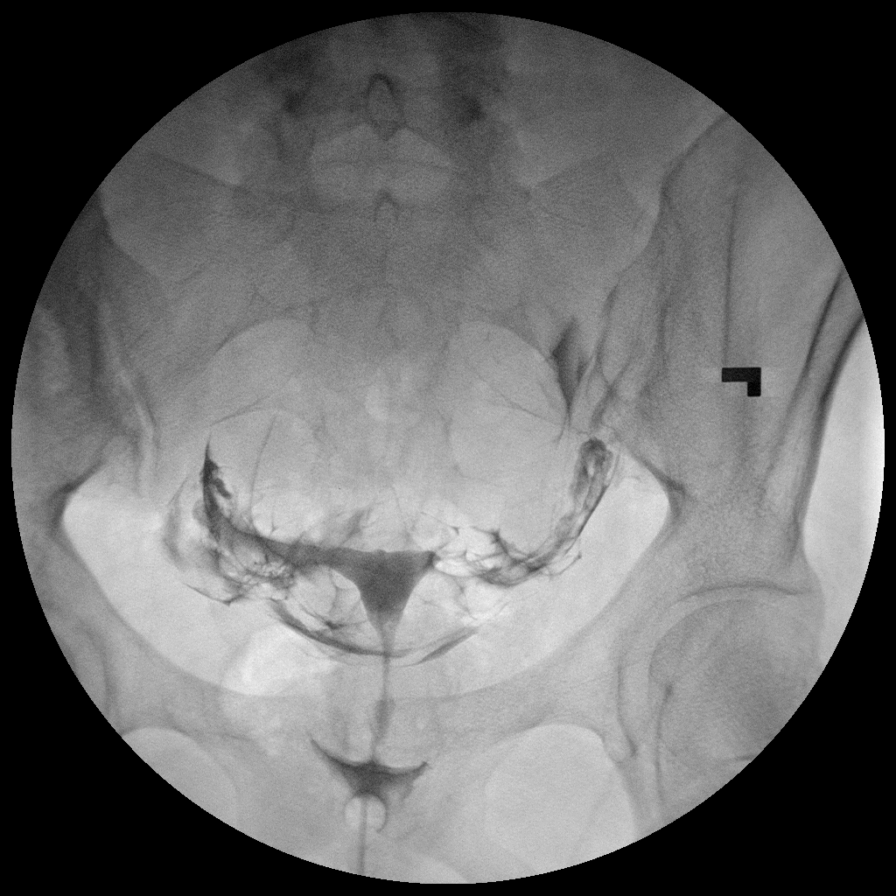

[6 of 6 positions shown; findings below may reference images not displayed]

FLUOROSCOPY TIME:  Fluoroscopy Time:  0 minutes 30 seconds

Number of Acquired Images:  6
FINDINGS: Endometrial Cavity: Normal appearance. No signs of Mullerian duct
anomaly or other significant abnormality.

Right Fallopian Tube: Well opacified and normal in appearance. Free
intraperitoneal spill of contrast is demonstrated.

Left Fallopian Tube: Well opacified and normal in appearance. Free
intraperitoneal spill of contrast is demonstrated.

Other:  None.
IMPRESSION: Normal study.  Both fallopian tubes are patent.

## 2014-10-11 MED ORDER — IOHEXOL 300 MG/ML  SOLN
30.0000 mL | Freq: Once | INTRAMUSCULAR | Status: AC | PRN
  Administered 2014-10-11: 30 mL

## 2015-04-28 ENCOUNTER — Other Ambulatory Visit: Payer: Self-pay | Admitting: Family Medicine

## 2015-04-28 DIAGNOSIS — R7989 Other specified abnormal findings of blood chemistry: Secondary | ICD-10-CM

## 2015-04-28 DIAGNOSIS — R945 Abnormal results of liver function studies: Principal | ICD-10-CM

## 2015-05-05 ENCOUNTER — Other Ambulatory Visit: Payer: Self-pay

## 2015-06-13 ENCOUNTER — Other Ambulatory Visit: Payer: Self-pay | Admitting: *Deleted

## 2015-06-13 ENCOUNTER — Telehealth: Payer: Self-pay | Admitting: *Deleted

## 2015-06-13 DIAGNOSIS — B20 Human immunodeficiency virus [HIV] disease: Secondary | ICD-10-CM

## 2015-06-13 MED ORDER — EMTRICITAB-RILPIVIR-TENOFOV DF 200-25-300 MG PO TABS
1.0000 | ORAL_TABLET | Freq: Every day | ORAL | Status: DC
Start: 2015-06-13 — End: 2015-07-26

## 2015-06-13 NOTE — Telephone Encounter (Signed)
Patient has changed pharmacies, they are requesting new prescription of complera.  Patient to be seen semi-annually, last office visit 12/15 last labs 2/16.  RN reached out to patient. She will come this week for labs. She has not missed a dose of medication (other than <5 days worth January last year). Refill sent.  Please advise if patient ok to be seen once a year (due to copays, work schedule).  She knows she is still due for an appointment. Andree Coss, RN

## 2015-06-15 ENCOUNTER — Other Ambulatory Visit: Payer: 59

## 2015-06-15 DIAGNOSIS — Z113 Encounter for screening for infections with a predominantly sexual mode of transmission: Secondary | ICD-10-CM

## 2015-06-15 DIAGNOSIS — Z79899 Other long term (current) drug therapy: Secondary | ICD-10-CM

## 2015-06-15 DIAGNOSIS — B20 Human immunodeficiency virus [HIV] disease: Secondary | ICD-10-CM

## 2015-06-15 LAB — COMPLETE METABOLIC PANEL WITH GFR
ALT: 240 U/L — ABNORMAL HIGH (ref 6–29)
AST: 190 U/L — ABNORMAL HIGH (ref 10–30)
Albumin: 4.2 g/dL (ref 3.6–5.1)
Alkaline Phosphatase: 111 U/L (ref 33–115)
BUN: 7 mg/dL (ref 7–25)
CO2: 25 mmol/L (ref 20–31)
Calcium: 9.4 mg/dL (ref 8.6–10.2)
Chloride: 100 mmol/L (ref 98–110)
Creat: 0.66 mg/dL (ref 0.50–1.10)
GFR, Est African American: >89 mL/min (ref >=60)
GFR, Est Non African American: >89 mL/min (ref >=60)
Glucose, Bld: 106 mg/dL — ABNORMAL HIGH (ref 65–99)
Potassium: 4 mmol/L (ref 3.5–5.3)
Sodium: 137 mmol/L (ref 135–146)
Total Bilirubin: 0.4 mg/dL (ref 0.2–1.2)
Total Protein: 7.4 g/dL (ref 6.1–8.1)

## 2015-06-15 LAB — LIPID PANEL
Cholesterol: 212 mg/dL — ABNORMAL HIGH (ref 125–200)
HDL: 37 mg/dL — ABNORMAL LOW (ref >=46)
LDL Cholesterol: 154 mg/dL — ABNORMAL HIGH (ref <130)
Total CHOL/HDL Ratio: 5.7 Ratio — ABNORMAL HIGH (ref <=5.0)
Triglycerides: 107 mg/dL (ref <150)
VLDL: 21 mg/dL (ref <30)

## 2015-06-15 LAB — CBC WITH DIFFERENTIAL/PLATELET
Basophils Absolute: 0 10*3/uL (ref 0.0–0.1)
Basophils Relative: 0 % (ref 0–1)
Eosinophils Absolute: 0 10*3/uL (ref 0.0–0.7)
Eosinophils Relative: 0 % (ref 0–5)
HCT: 41.2 % (ref 36.0–46.0)
Hemoglobin: 13.9 g/dL (ref 12.0–15.0)
Lymphocytes Relative: 20 % (ref 12–46)
Lymphs Abs: 2.2 10*3/uL (ref 0.7–4.0)
MCH: 32 pg (ref 26.0–34.0)
MCHC: 33.7 g/dL (ref 30.0–36.0)
MCV: 94.7 fL (ref 78.0–100.0)
MPV: 10.9 fL (ref 8.6–12.4)
Monocytes Absolute: 1.1 10*3/uL — ABNORMAL HIGH (ref 0.1–1.0)
Monocytes Relative: 10 % (ref 3–12)
Neutro Abs: 7.8 10*3/uL — ABNORMAL HIGH (ref 1.7–7.7)
Neutrophils Relative %: 70 % (ref 43–77)
Platelets: 263 10*3/uL (ref 150–400)
RBC: 4.35 MIL/uL (ref 3.87–5.11)
RDW: 14.4 % (ref 11.5–15.5)
WBC: 11.2 10*3/uL — ABNORMAL HIGH (ref 4.0–10.5)

## 2015-06-16 LAB — HIV-1 RNA QUANT-NO REFLEX-BLD
HIV 1 RNA Quant: <20 copies/mL (ref <20)
HIV-1 RNA Quant, Log: <1.3 Log copies/mL (ref <1.30)

## 2015-06-16 LAB — RPR

## 2015-06-16 LAB — T-HELPER CELL (CD4) - (RCID CLINIC ONLY)
CD4 % Helper T Cell: 43 % (ref 33–55)
CD4 T Cell Abs: 990 /uL (ref 400–2700)

## 2015-07-26 ENCOUNTER — Other Ambulatory Visit: Payer: Self-pay | Admitting: *Deleted

## 2015-07-26 DIAGNOSIS — B20 Human immunodeficiency virus [HIV] disease: Secondary | ICD-10-CM

## 2015-07-26 MED ORDER — EMTRICITAB-RILPIVIR-TENOFOV DF 200-25-300 MG PO TABS: 1.0000 | ORAL_TABLET | Freq: Every day | ORAL | Status: DC

## 2015-08-04 ENCOUNTER — Other Ambulatory Visit: Payer: Self-pay | Admitting: *Deleted

## 2015-08-04 DIAGNOSIS — B20 Human immunodeficiency virus [HIV] disease: Secondary | ICD-10-CM

## 2015-08-04 MED ORDER — EMTRICITAB-RILPIVIR-TENOFOV DF 200-25-300 MG PO TABS
1.0000 | ORAL_TABLET | Freq: Every day | ORAL | Status: DC
Start: 2015-08-04 — End: 2015-08-24

## 2015-08-04 NOTE — Telephone Encounter (Signed)
Needs f/u appt. Last appt 03/2014.  Refilled Complera until follow-up appt w/ Dr. Luciana Axeomer 09/20/15 at 1000.  Pt needed AM appt.

## 2015-08-24 ENCOUNTER — Other Ambulatory Visit: Payer: Self-pay | Admitting: *Deleted

## 2015-08-24 DIAGNOSIS — B20 Human immunodeficiency virus [HIV] disease: Secondary | ICD-10-CM

## 2015-08-24 MED ORDER — EMTRICITAB-RILPIVIR-TENOFOV DF 200-25-300 MG PO TABS: 1.0000 | ORAL_TABLET | Freq: Every day | ORAL | Status: DC

## 2015-09-05 ENCOUNTER — Ambulatory Visit
Admission: RE | Admit: 2015-09-05 | Discharge: 2015-09-05 | Disposition: A | Discharge status: Home / Self Care | Payer: 59 | Source: Ambulatory Visit | Attending: Family Medicine | Admitting: Family Medicine

## 2015-09-05 DIAGNOSIS — R945 Abnormal results of liver function studies: Principal | ICD-10-CM

## 2015-09-05 DIAGNOSIS — R7989 Other specified abnormal findings of blood chemistry: Secondary | ICD-10-CM

## 2015-09-05 IMAGING — US US ABDOMEN LIMITED
1 series · 14 of 25 positions shown · non-contrast
Comparison: No prior.

CLINICAL DATA: Elevated LFTs.

EXAM:
US ABDOMEN LIMITED - RIGHT UPPER QUADRANT

[Series 1: us abdomen limited · 0.24mm/px · 14 of 45 slices shown]
[im 1/45]
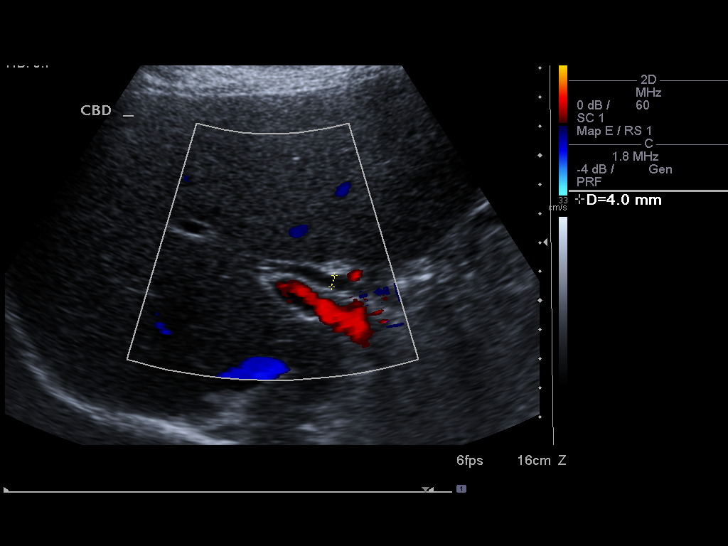
[im 4/45]
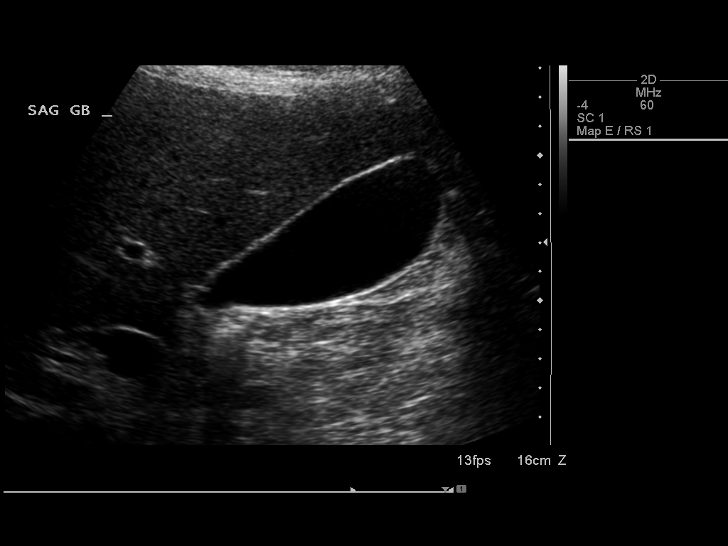
[im 8/45]
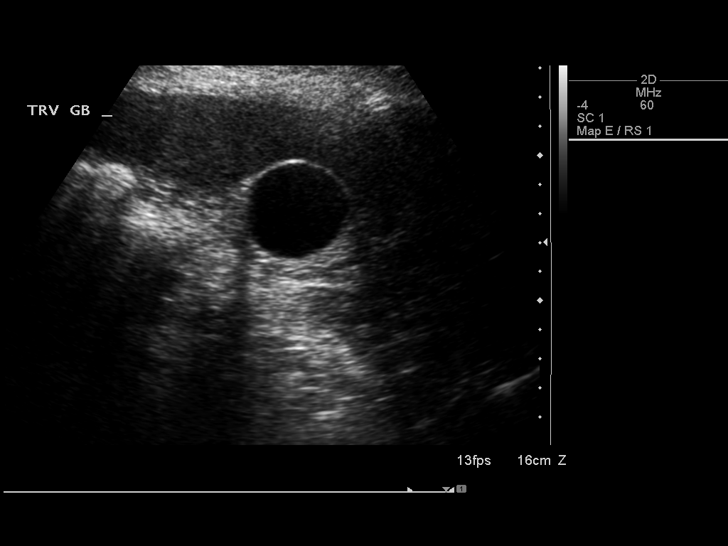
[im 12/45]
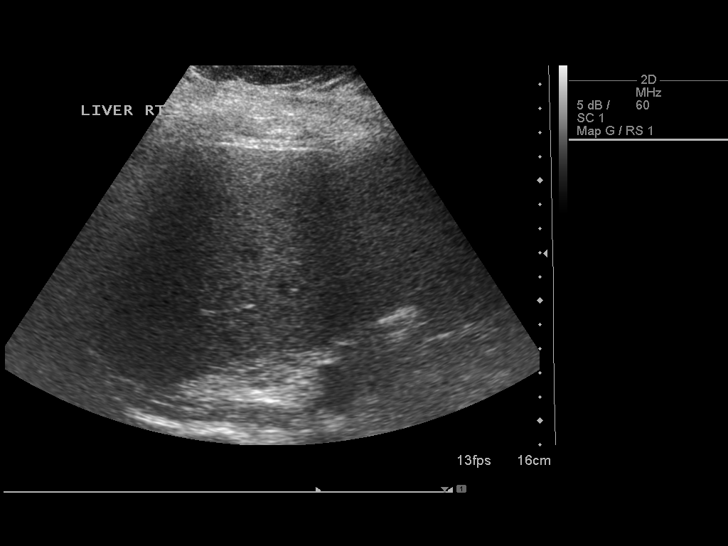
[im 15/45]
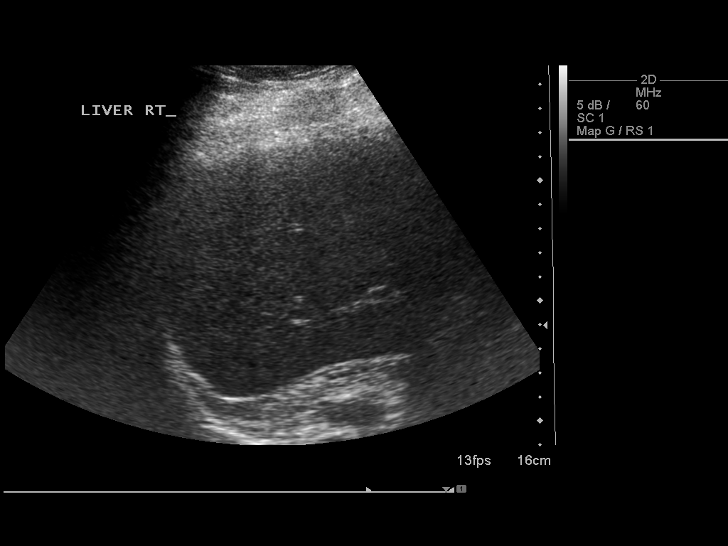
[im 17/45]
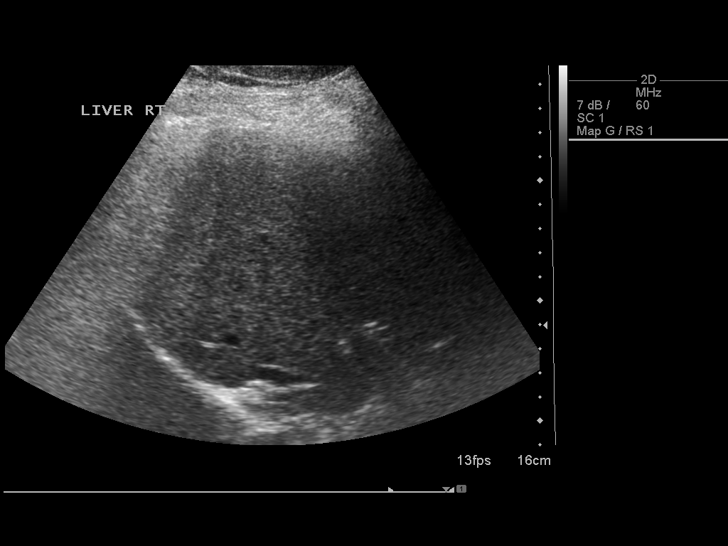
[im 21/45]
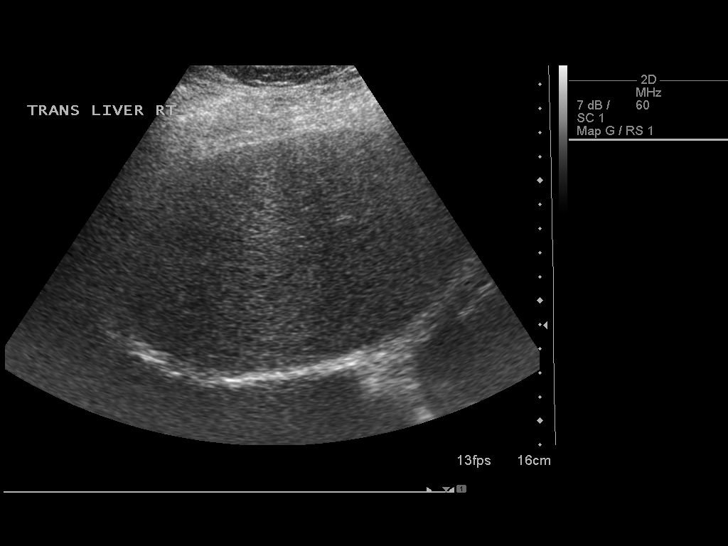
[im 24/45]
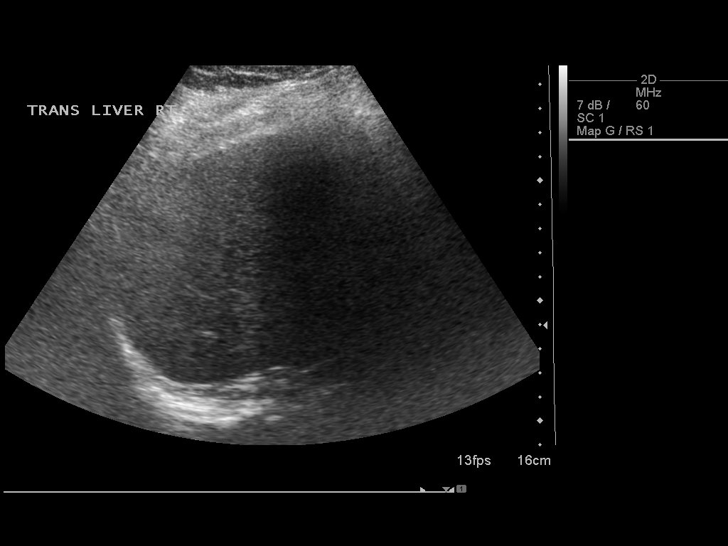
[im 28/45]
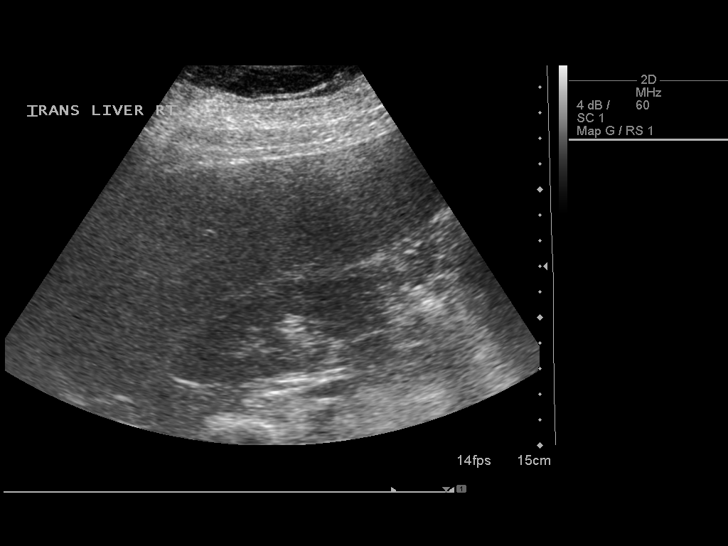
[im 30/45]
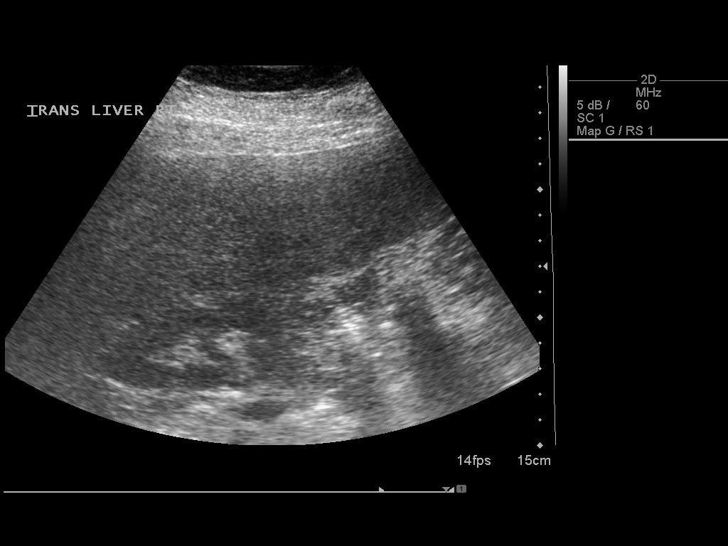
[im 34/45]
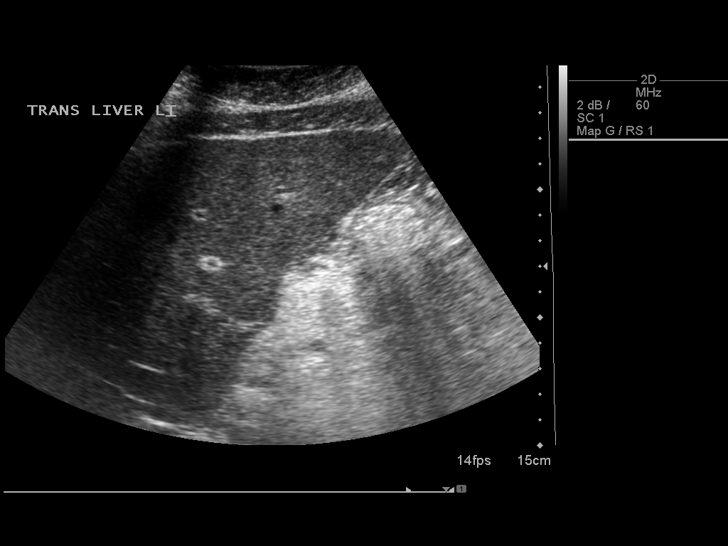
[im 37/45]
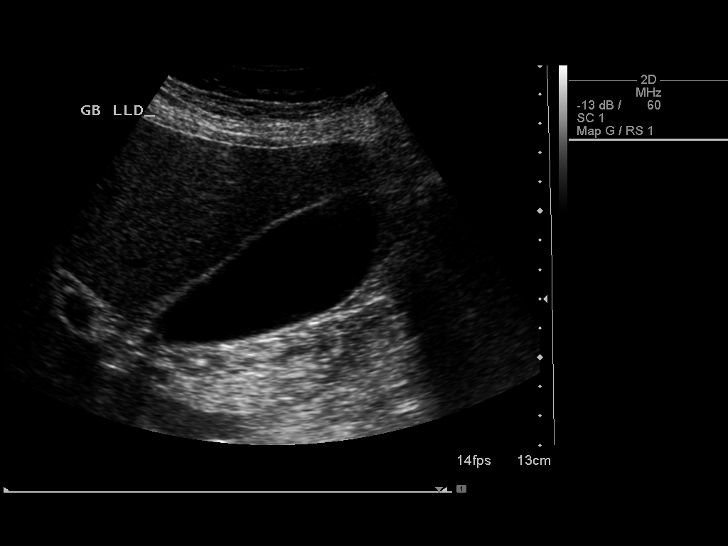
[im 41/45]
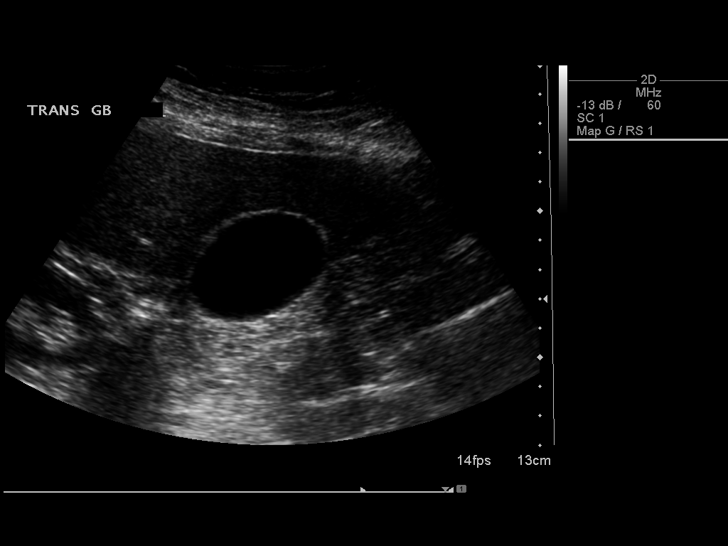
[im 45/45]
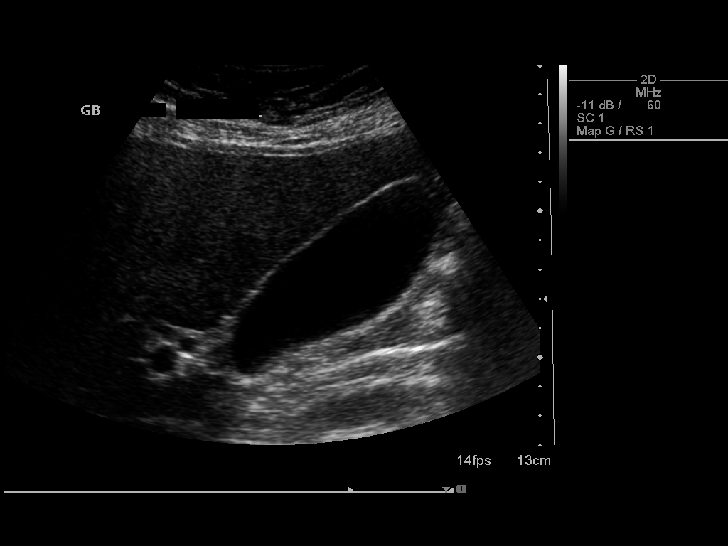

[14 of 25 positions shown; findings below may reference images not displayed]

FINDINGS: Gallbladder:

No gallstones or wall thickening visualized. No sonographic Murphy
sign noted by sonographer.

Common bile duct:

Diameter: 4.0 mm

Liver:

Liver has a slightly heterogeneous echotexture. Fatty infiltration
and/or hepatocellular disease cannot be excluded.
IMPRESSION: Liver has a slightly heterogeneous echotexture. Fatty infiltration
and/or hepatocellular disease cannot be excluded .

## 2015-09-20 ENCOUNTER — Ambulatory Visit: Payer: Self-pay | Admitting: Internal Medicine

## 2015-10-27 ENCOUNTER — Other Ambulatory Visit: Payer: Self-pay | Admitting: *Deleted

## 2015-10-27 ENCOUNTER — Telehealth: Payer: Self-pay | Admitting: *Deleted

## 2015-10-27 DIAGNOSIS — B20 Human immunodeficiency virus [HIV] disease: Secondary | ICD-10-CM

## 2015-10-27 MED ORDER — EMTRICITAB-RILPIVIR-TENOFOV DF 200-25-300 MG PO TABS
ORAL_TABLET | ORAL | Status: DC
Start: 2015-10-27 — End: 2015-12-02

## 2015-10-27 NOTE — Telephone Encounter (Signed)
Attempted to reach out to patient regarding the need to be seen for refills.  No answer when RN called the listed number. One more month sent per Dr. Luciana Axeomer. Andree CossHowell, Michelle M, RN

## 2015-12-02 ENCOUNTER — Other Ambulatory Visit: Payer: Self-pay | Admitting: *Deleted

## 2015-12-02 DIAGNOSIS — B20 Human immunodeficiency virus [HIV] disease: Secondary | ICD-10-CM

## 2015-12-02 MED ORDER — EMTRICITAB-RILPIVIR-TENOFOV DF 200-25-300 MG PO TABS: ORAL_TABLET | ORAL | 1 refills | Status: DC

## 2015-12-27 ENCOUNTER — Ambulatory Visit: Payer: 59 | Admitting: Internal Medicine

## 2015-12-27 ENCOUNTER — Telehealth: Payer: Self-pay | Admitting: *Deleted

## 2015-12-27 NOTE — Telephone Encounter (Signed)
Per Dr. Luciana Axeomer patient needs appt for further refills; last appt 03/2014. He said it is ok to schedule patient with the pharmacist.

## 2016-01-26 ENCOUNTER — Telehealth: Payer: Self-pay | Admitting: *Deleted

## 2016-01-26 NOTE — Telephone Encounter (Signed)
Left message for the patient.  Last MD visit 03/27/14, unable to refill Complera prescription.  Pt has "no showed" last to MD visits, 09/20/15 and 12/27/15.  RN requested that the patient call ASAP for an appointment with Dr. Luciana Axeomer and to keep that appointment for refills.  Denied refill, asked [place] to tell patient re: needing appointment.

## 2016-01-30 NOTE — Telephone Encounter (Signed)
Agree, thanks

## 2016-02-11 IMAGING — US US ABDOMEN COMPLETE
1 series · 14 of 25 positions shown · non-contrast
Comparison: Right upper quadrant ultrasound dated [DATE]

CLINICAL DATA: Left upper quadrant abdominal pain x a few days

EXAM:
ABDOMEN ULTRASOUND COMPLETE

[Series 1: us abdomen complete · 0.30mm/px · 14 of 97 slices shown]
[im 1/97]
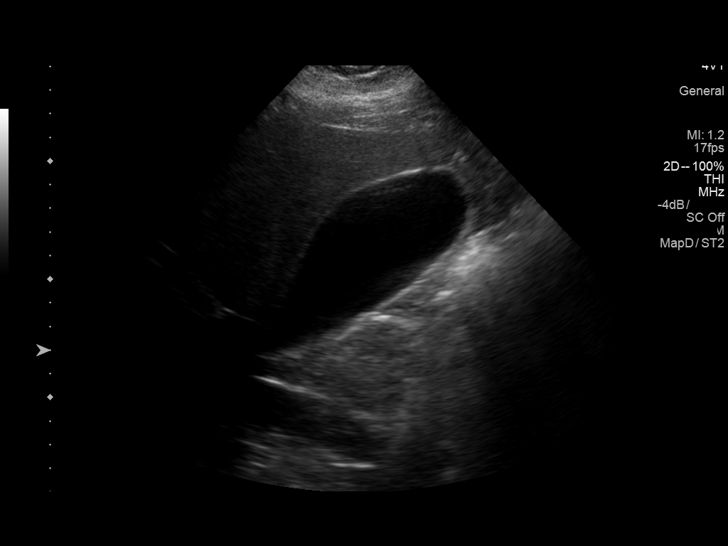
[im 9/97]
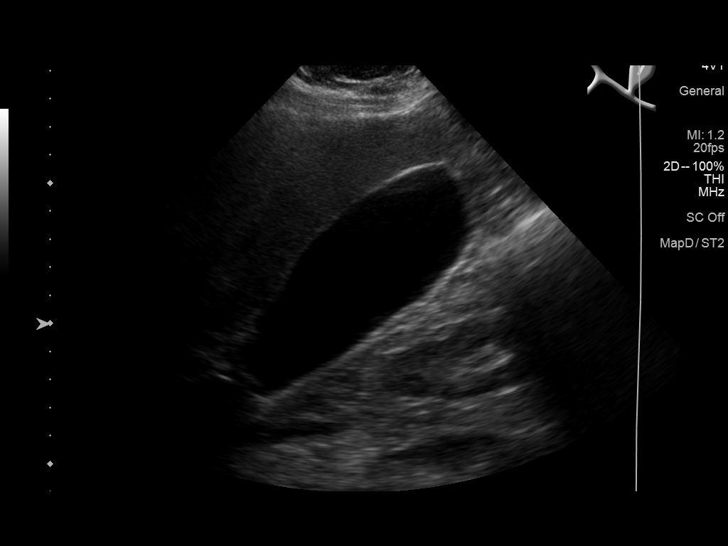
[im 17/97]
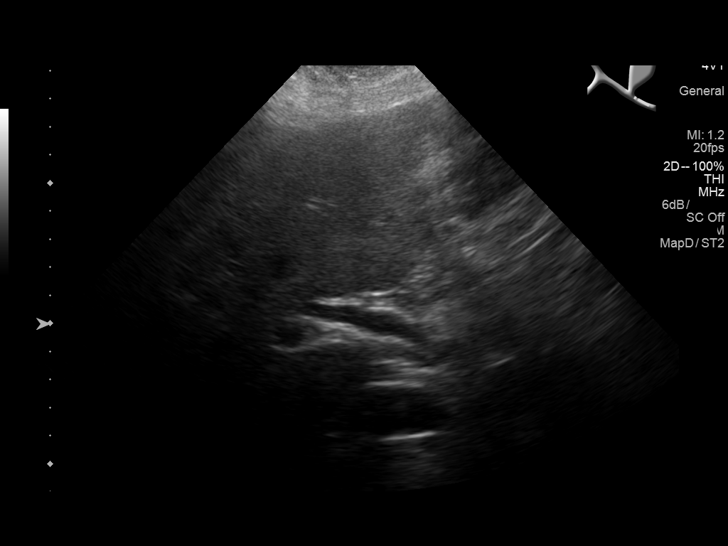
[im 25/97]
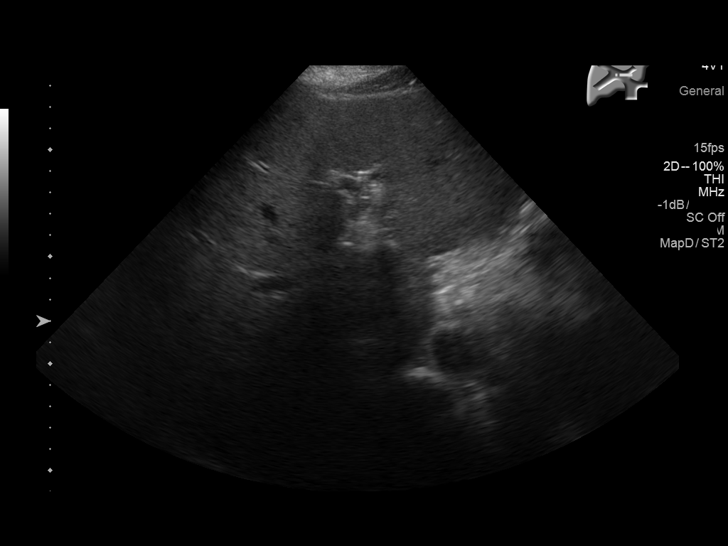
[im 33/97]
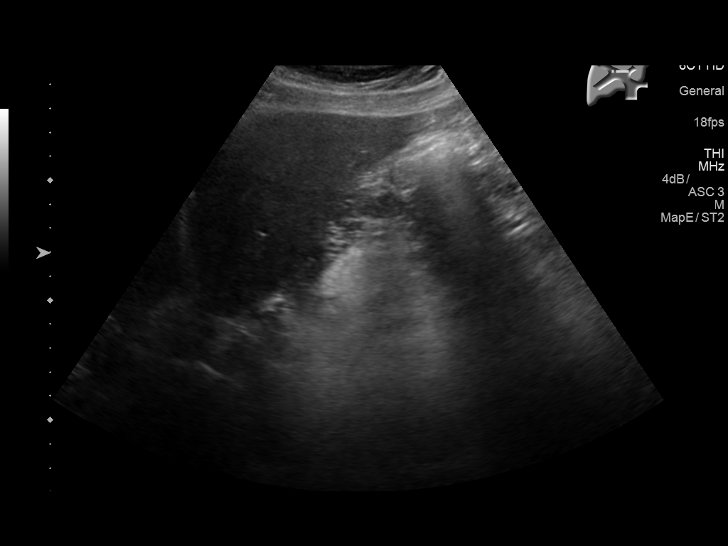
[im 37/97]
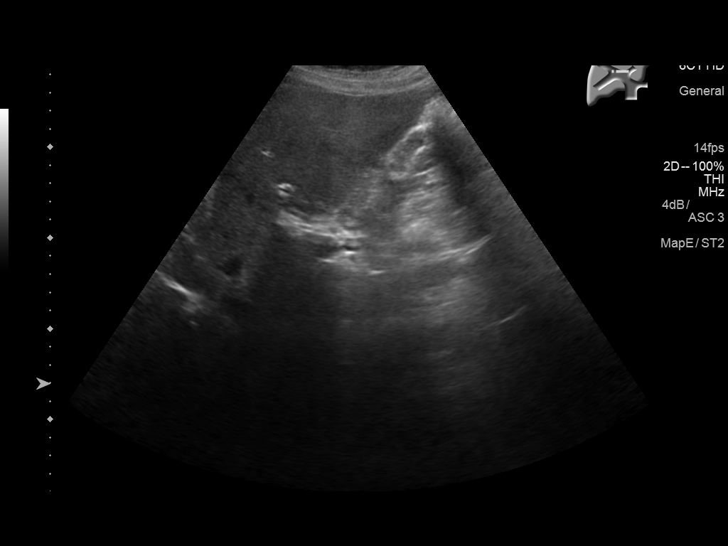
[im 45/97]
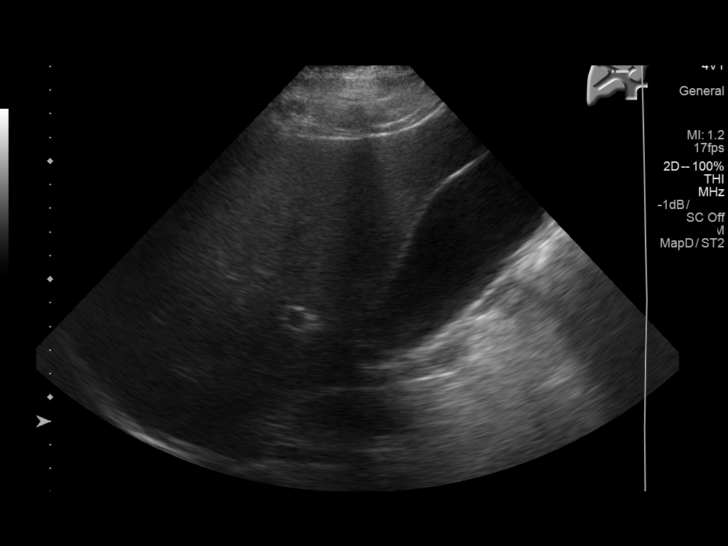
[im 53/97]
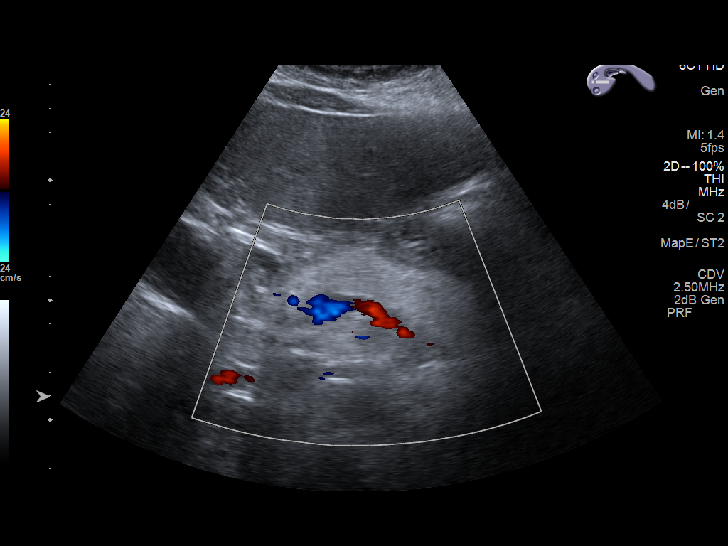
[im 61/97]
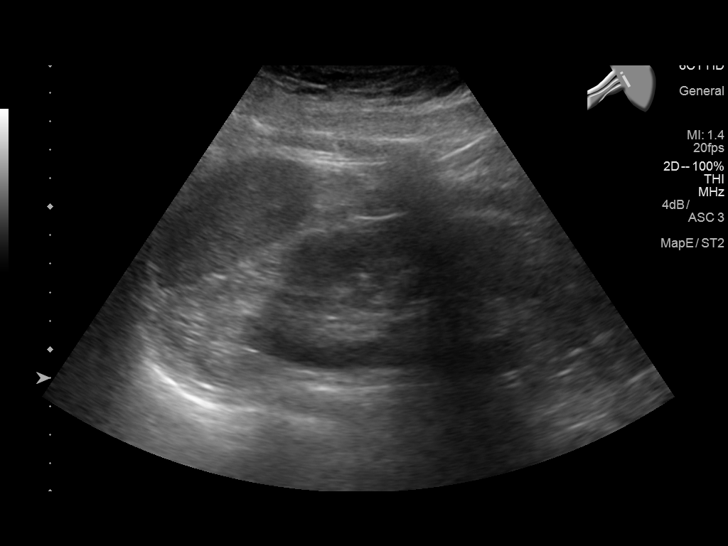
[im 65/97]
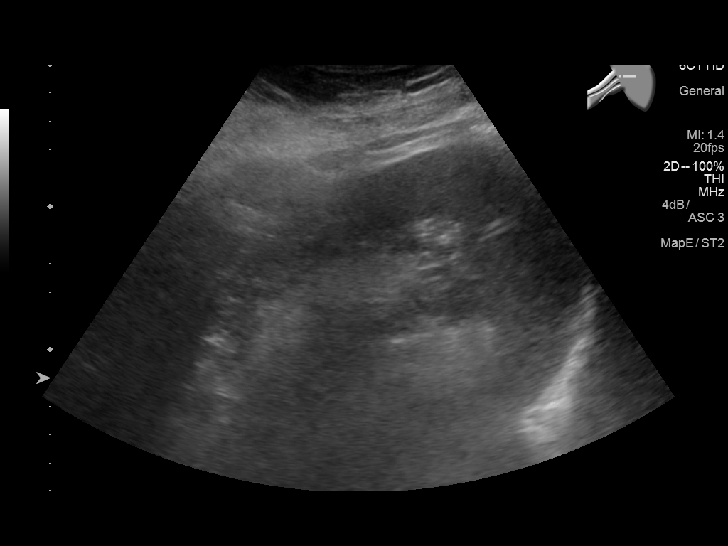
[im 73/97]
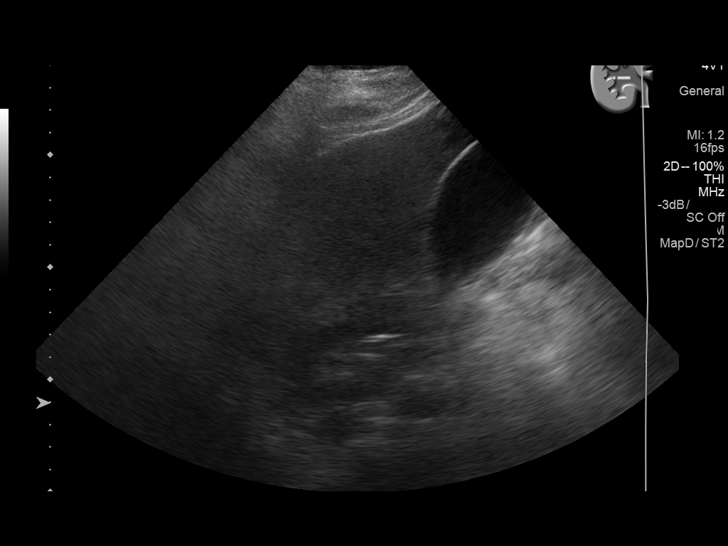
[im 81/97]
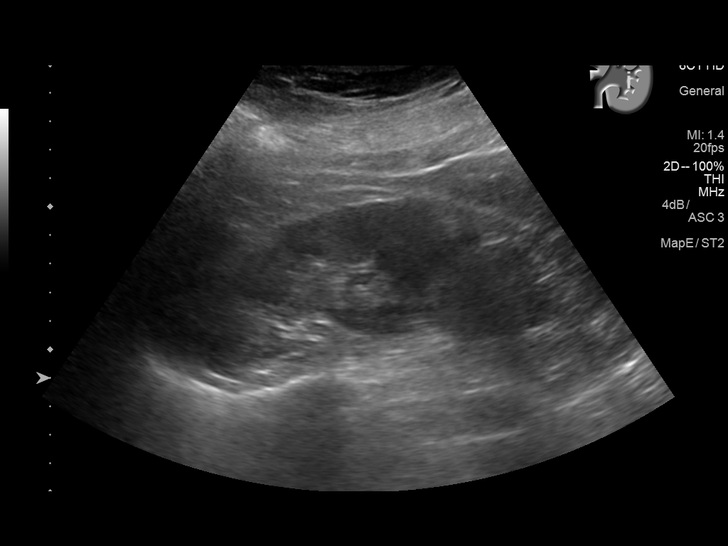
[im 89/97]
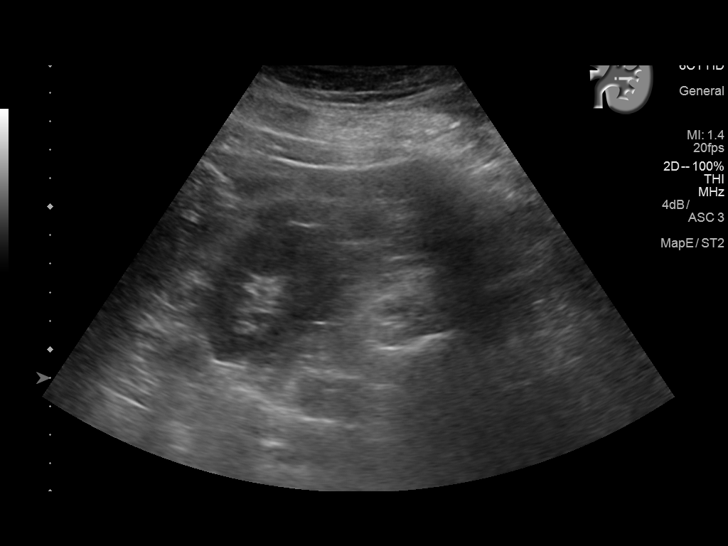
[im 97/97]
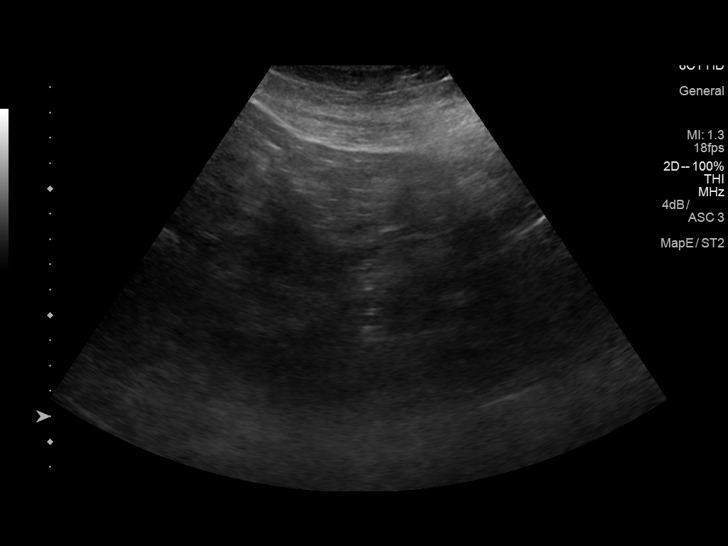

[14 of 25 positions shown; findings below may reference images not displayed]

FINDINGS: Gallbladder: No gallstones, gallbladder wall thickening, or
pericholecystic fluid. Negative sonographic Murphy's sign.

Common bile duct: Diameter: 3 mm

Liver: Hyperechoic hepatic parenchyma, suggesting hepatic steatosis,
with focal fatty sparing along the gallbladder fossa.

IVC: No abnormality visualized.

Pancreas: Poorly visualized due to overlying bowel gas.

Spleen: Size and appearance within normal limits.

Right Kidney: Length: 12.4 cm.  No mass or hydronephrosis.

Left Kidney: Length: 12.1 cm.  No mass or hydronephrosis.

Abdominal aorta: No aneurysm visualized.

Other findings: None.
IMPRESSION: Hepatic steatosis with focal fatty sparing.

## 2016-03-21 ENCOUNTER — Encounter: Payer: Self-pay | Admitting: Infectious Diseases

## 2016-03-21 ENCOUNTER — Ambulatory Visit (INDEPENDENT_AMBULATORY_CARE_PROVIDER_SITE_OTHER): Payer: 59 | Admitting: Infectious Diseases

## 2016-03-21 ENCOUNTER — Other Ambulatory Visit (HOSPITAL_COMMUNITY)
Admission: RE | Admit: 2016-03-21 | Discharge: 2016-03-21 | Disposition: A | Discharge status: Home / Self Care | Payer: 59 | Source: Ambulatory Visit | Attending: Infectious Diseases | Admitting: Infectious Diseases

## 2016-03-21 VITALS — BP 142/88 | HR 80 | Temp 98.3°F | Ht 62.0 in | Wt 215.0 lb

## 2016-03-21 DIAGNOSIS — Z79899 Other long term (current) drug therapy: Secondary | ICD-10-CM

## 2016-03-21 DIAGNOSIS — E669 Obesity, unspecified: Secondary | ICD-10-CM

## 2016-03-21 DIAGNOSIS — Z6839 Body mass index (BMI) 39.0-39.9, adult: Secondary | ICD-10-CM

## 2016-03-21 DIAGNOSIS — E66813 Obesity, class 3: Secondary | ICD-10-CM | POA: Insufficient documentation

## 2016-03-21 DIAGNOSIS — Z113 Encounter for screening for infections with a predominantly sexual mode of transmission: Secondary | ICD-10-CM | POA: Diagnosis not present

## 2016-03-21 DIAGNOSIS — B20 Human immunodeficiency virus [HIV] disease: Secondary | ICD-10-CM

## 2016-03-21 LAB — COMPREHENSIVE METABOLIC PANEL
ALT: 91 U/L — ABNORMAL HIGH (ref 6–29)
AST: 133 U/L — ABNORMAL HIGH (ref 10–30)
Albumin: 4.1 g/dL (ref 3.6–5.1)
Alkaline Phosphatase: 97 U/L (ref 33–115)
BUN: 9 mg/dL (ref 7–25)
CO2: 26 mmol/L (ref 20–31)
Calcium: 9.3 mg/dL (ref 8.6–10.2)
Chloride: 101 mmol/L (ref 98–110)
Creat: 0.7 mg/dL (ref 0.50–1.10)
Glucose, Bld: 95 mg/dL (ref 65–99)
Potassium: 4.1 mmol/L (ref 3.5–5.3)
Sodium: 138 mmol/L (ref 135–146)
Total Bilirubin: 0.7 mg/dL (ref 0.2–1.2)
Total Protein: 6.9 g/dL (ref 6.1–8.1)

## 2016-03-21 LAB — LIPID PANEL
Cholesterol: 246 mg/dL — ABNORMAL HIGH (ref <200)
HDL: 51 mg/dL (ref >50)
LDL Cholesterol: 162 mg/dL — ABNORMAL HIGH (ref <100)
Total CHOL/HDL Ratio: 4.8 Ratio (ref <5.0)
Triglycerides: 167 mg/dL — ABNORMAL HIGH (ref <150)
VLDL: 33 mg/dL — ABNORMAL HIGH (ref <30)

## 2016-03-21 LAB — CBC
HCT: 40.2 % (ref 35.0–45.0)
Hemoglobin: 13.1 g/dL (ref 11.7–15.5)
MCH: 31.5 pg (ref 27.0–33.0)
MCHC: 32.6 g/dL (ref 32.0–36.0)
MCV: 96.6 fL (ref 80.0–100.0)
MPV: 10.7 fL (ref 7.5–12.5)
Platelets: 201 10*3/uL (ref 140–400)
RBC: 4.16 MIL/uL (ref 3.80–5.10)
RDW: 15.4 % — ABNORMAL HIGH (ref 11.0–15.0)
WBC: 8.6 10*3/uL (ref 3.8–10.8)

## 2016-03-21 MED ORDER — EMTRICITAB-RILPIVIR-TENOFOV AF 200-25-25 MG PO TABS: 1.0000 | ORAL_TABLET | Freq: Every day | ORAL | 3 refills | Status: DC

## 2016-03-21 NOTE — Assessment & Plan Note (Addendum)
Doing very well.  Does not want flu shot.  Recommend mening vax. She is not clear that she may have gotten this.  Change to odefsy. Take with food.  Labs today.  She will talk to husband about PREP.  rtc in 1 yr.

## 2016-03-21 NOTE — Progress Notes (Signed)
   Subjective:    Patient ID: Lindsay French, female    DOB: 1980-07-05, 35 y.o.   MRN: [id number]  HPI 35 yo F with HIV+ known since 2006. Was prev on DRVc/TRV. Not clear why she switched (possibly due to interaction with her fertility rx).  Has been off complera for last month.   HIV 1 RNA Quant (copies/mL)  Date Value  06/15/2015 <20  05/31/2014 <20  03/30/2014 <20   CD4 T Cell Abs (/uL)  Date Value  06/15/2015 990  05/31/2014 1,140  03/30/2014 900   Went off fertility rx in March 2017, has had 6 negative pregnancy test since  Has had no period since. Occas spotting.  Husband is (-). Use lambskin condoms when not trying to conceive.  Last PAP- over a year.   Review of Systems  Constitutional: Negative for appetite change and unexpected weight change.  Gastrointestinal: Positive for anal bleeding. Negative for constipation and diarrhea.  Genitourinary: Positive for menstrual problem. Negative for difficulty urinating.  worried she has hemmerhoids, painful defectation. For last 3 weeks.   Wt has been slowly up. Works as a Social workernanny for 3 young boys. Hard to get regular exercise.No soda, no caffiene, occas red wine. No fast food.     Objective:   Physical Exam  Constitutional: She appears well-developed and well-nourished.  HENT:  Mouth/Throat: No oropharyngeal exudate.  Eyes: EOM are normal. Pupils are equal, round, and reactive to light.  Neck: Neck supple.  Cardiovascular: Normal rate, regular rhythm and normal heart sounds.   Pulmonary/Chest: Effort normal and breath sounds normal.  Abdominal: Soft. Bowel sounds are normal. There is no tenderness. There is no rebound.  Musculoskeletal: She exhibits no edema.  Lymphadenopathy:    She has no cervical adenopathy.      Assessment & Plan:

## 2016-03-21 NOTE — Assessment & Plan Note (Signed)
BMI 39 Encouraged her diet and exercise.

## 2016-03-21 NOTE — Addendum Note (Signed)
Addended by: Mariea ClontsGREEN, Maylon Sailors D on: 03/21/2016 05:03 PM   Modules accepted: Orders

## 2016-03-22 LAB — RPR

## 2016-03-23 LAB — URINE CYTOLOGY ANCILLARY ONLY
Chlamydia: NEGATIVE
Neisseria Gonorrhea: NEGATIVE

## 2016-03-23 LAB — T-HELPER CELL (CD4) - (RCID CLINIC ONLY)
CD4 % Helper T Cell: 45 % (ref 33–55)
CD4 T Cell Abs: 790 /uL (ref 400–2700)

## 2016-03-26 LAB — HIV-1 RNA QUANT-NO REFLEX-BLD
HIV 1 RNA Quant: 3762 copies/mL — ABNORMAL HIGH (ref <20)
HIV-1 RNA Quant, Log: 3.58 Log copies/mL — ABNORMAL HIGH (ref <1.30)

## 2016-04-16 ENCOUNTER — Encounter (HOSPITAL_COMMUNITY): Payer: Self-pay | Admitting: Emergency Medicine

## 2016-04-16 ENCOUNTER — Inpatient Hospital Stay (HOSPITAL_COMMUNITY)
Admission: EM | Admit: 2016-04-16 | Discharge: 2016-04-17 | DRG: 193 | Disposition: A | Discharge status: Home / Self Care | Payer: 59 | Attending: Nephrology | Admitting: Nephrology

## 2016-04-16 ENCOUNTER — Inpatient Hospital Stay (HOSPITAL_COMMUNITY): Payer: 59

## 2016-04-16 ENCOUNTER — Emergency Department (HOSPITAL_COMMUNITY): Payer: 59

## 2016-04-16 DIAGNOSIS — B20 Human immunodeficiency virus [HIV] disease: Secondary | ICD-10-CM | POA: Diagnosis not present

## 2016-04-16 DIAGNOSIS — J189 Pneumonia, unspecified organism: Secondary | ICD-10-CM | POA: Diagnosis present

## 2016-04-16 DIAGNOSIS — Z21 Asymptomatic human immunodeficiency virus [HIV] infection status: Secondary | ICD-10-CM | POA: Diagnosis present

## 2016-04-16 DIAGNOSIS — J9601 Acute respiratory failure with hypoxia: Secondary | ICD-10-CM | POA: Diagnosis present

## 2016-04-16 DIAGNOSIS — R740 Nonspecific elevation of levels of transaminase and lactic acid dehydrogenase [LDH]: Secondary | ICD-10-CM | POA: Diagnosis present

## 2016-04-16 DIAGNOSIS — I10 Essential (primary) hypertension: Secondary | ICD-10-CM | POA: Diagnosis present

## 2016-04-16 DIAGNOSIS — Z8709 Personal history of other diseases of the respiratory system: Secondary | ICD-10-CM | POA: Diagnosis not present

## 2016-04-16 DIAGNOSIS — J181 Lobar pneumonia, unspecified organism: Secondary | ICD-10-CM | POA: Diagnosis not present

## 2016-04-16 DIAGNOSIS — Z87891 Personal history of nicotine dependence: Secondary | ICD-10-CM | POA: Diagnosis not present

## 2016-04-16 DIAGNOSIS — J209 Acute bronchitis, unspecified: Secondary | ICD-10-CM | POA: Diagnosis present

## 2016-04-16 DIAGNOSIS — R0902 Hypoxemia: Secondary | ICD-10-CM

## 2016-04-16 DIAGNOSIS — Z79899 Other long term (current) drug therapy: Secondary | ICD-10-CM | POA: Diagnosis not present

## 2016-04-16 DIAGNOSIS — K760 Fatty (change of) liver, not elsewhere classified: Secondary | ICD-10-CM

## 2016-04-16 DIAGNOSIS — R7989 Other specified abnormal findings of blood chemistry: Secondary | ICD-10-CM | POA: Diagnosis present

## 2016-04-16 DIAGNOSIS — R1012 Left upper quadrant pain: Secondary | ICD-10-CM | POA: Diagnosis present

## 2016-04-16 DIAGNOSIS — J208 Acute bronchitis due to other specified organisms: Secondary | ICD-10-CM | POA: Diagnosis not present

## 2016-04-16 DIAGNOSIS — R062 Wheezing: Secondary | ICD-10-CM

## 2016-04-16 DIAGNOSIS — Z885 Allergy status to narcotic agent status: Secondary | ICD-10-CM

## 2016-04-16 DIAGNOSIS — Z91040 Latex allergy status: Secondary | ICD-10-CM

## 2016-04-16 DIAGNOSIS — Z8349 Family history of other endocrine, nutritional and metabolic diseases: Secondary | ICD-10-CM

## 2016-04-16 DIAGNOSIS — F418 Other specified anxiety disorders: Secondary | ICD-10-CM | POA: Diagnosis present

## 2016-04-16 HISTORY — DX: Fatty (change of) liver, not elsewhere classified: K76.0

## 2016-04-16 LAB — CBC WITH DIFFERENTIAL/PLATELET
Basophils Absolute: 0 10*3/uL (ref 0.0–0.1)
Basophils Relative: 0 %
Eosinophils Absolute: 0 10*3/uL (ref 0.0–0.7)
Eosinophils Relative: 0 %
HCT: 40.8 % (ref 36.0–46.0)
Hemoglobin: 13.1 g/dL (ref 12.0–15.0)
Lymphocytes Relative: 42 %
Lymphs Abs: 4.7 10*3/uL — ABNORMAL HIGH (ref 0.7–4.0)
MCH: 32 pg (ref 26.0–34.0)
MCHC: 32.1 g/dL (ref 30.0–36.0)
MCV: 99.8 fL (ref 78.0–100.0)
Monocytes Absolute: 0.8 10*3/uL (ref 0.1–1.0)
Monocytes Relative: 7 %
Neutro Abs: 5.6 10*3/uL (ref 1.7–7.7)
Neutrophils Relative %: 51 %
Platelets: 214 10*3/uL (ref 150–400)
RBC: 4.09 MIL/uL (ref 3.87–5.11)
RDW: 14.9 % (ref 11.5–15.5)
WBC: 11.2 10*3/uL — ABNORMAL HIGH (ref 4.0–10.5)

## 2016-04-16 LAB — HEPATIC FUNCTION PANEL
ALT: 139 U/L — ABNORMAL HIGH (ref 14–54)
AST: 109 U/L — ABNORMAL HIGH (ref 15–41)
Albumin: 3.7 g/dL (ref 3.5–5.0)
Alkaline Phosphatase: 94 U/L (ref 38–126)
Bilirubin, Direct: <0.1 mg/dL — ABNORMAL LOW (ref 0.1–0.5)
Total Bilirubin: 0.5 mg/dL (ref 0.3–1.2)
Total Protein: 7 g/dL (ref 6.5–8.1)

## 2016-04-16 LAB — CBC
HCT: 38.3 % (ref 36.0–46.0)
Hemoglobin: 12.4 g/dL (ref 12.0–15.0)
MCH: 32 pg (ref 26.0–34.0)
MCHC: 32.4 g/dL (ref 30.0–36.0)
MCV: 98.7 fL (ref 78.0–100.0)
Platelets: 208 10*3/uL (ref 150–400)
RBC: 3.88 MIL/uL (ref 3.87–5.11)
RDW: 15 % (ref 11.5–15.5)
WBC: 10.2 10*3/uL (ref 4.0–10.5)

## 2016-04-16 LAB — BASIC METABOLIC PANEL
Anion gap: 9 (ref 5–15)
BUN: 6 mg/dL (ref 6–20)
CO2: 30 mmol/L (ref 22–32)
Calcium: 8.6 mg/dL — ABNORMAL LOW (ref 8.9–10.3)
Chloride: 101 mmol/L (ref 101–111)
Creatinine, Ser: 0.82 mg/dL (ref 0.44–1.00)
GFR calc Af Amer: >60 mL/min (ref >60)
GFR calc non Af Amer: >60 mL/min (ref >60)
Glucose, Bld: 148 mg/dL — ABNORMAL HIGH (ref 65–99)
Potassium: 3.3 mmol/L — ABNORMAL LOW (ref 3.5–5.1)
Sodium: 140 mmol/L (ref 135–145)

## 2016-04-16 LAB — LACTATE DEHYDROGENASE: LDH: 331 U/L — ABNORMAL HIGH (ref 98–192)

## 2016-04-16 LAB — CREATININE, SERUM
Creatinine, Ser: 0.7 mg/dL (ref 0.44–1.00)
GFR calc Af Amer: >60 mL/min (ref >60)
GFR calc non Af Amer: >60 mL/min (ref >60)

## 2016-04-16 LAB — LIPASE, BLOOD: Lipase: 24 U/L (ref 11–51)

## 2016-04-16 LAB — HCG, SERUM, QUALITATIVE: Preg, Serum: NEGATIVE

## 2016-04-16 IMAGING — CR DG CHEST 2V
2 series · 2 of 2 positions shown · non-contrast
Comparison: None.

CLINICAL DATA: Cough and wheeze for the past 7 weeks.

EXAM:
CHEST  2 VIEW

[w chest pa]
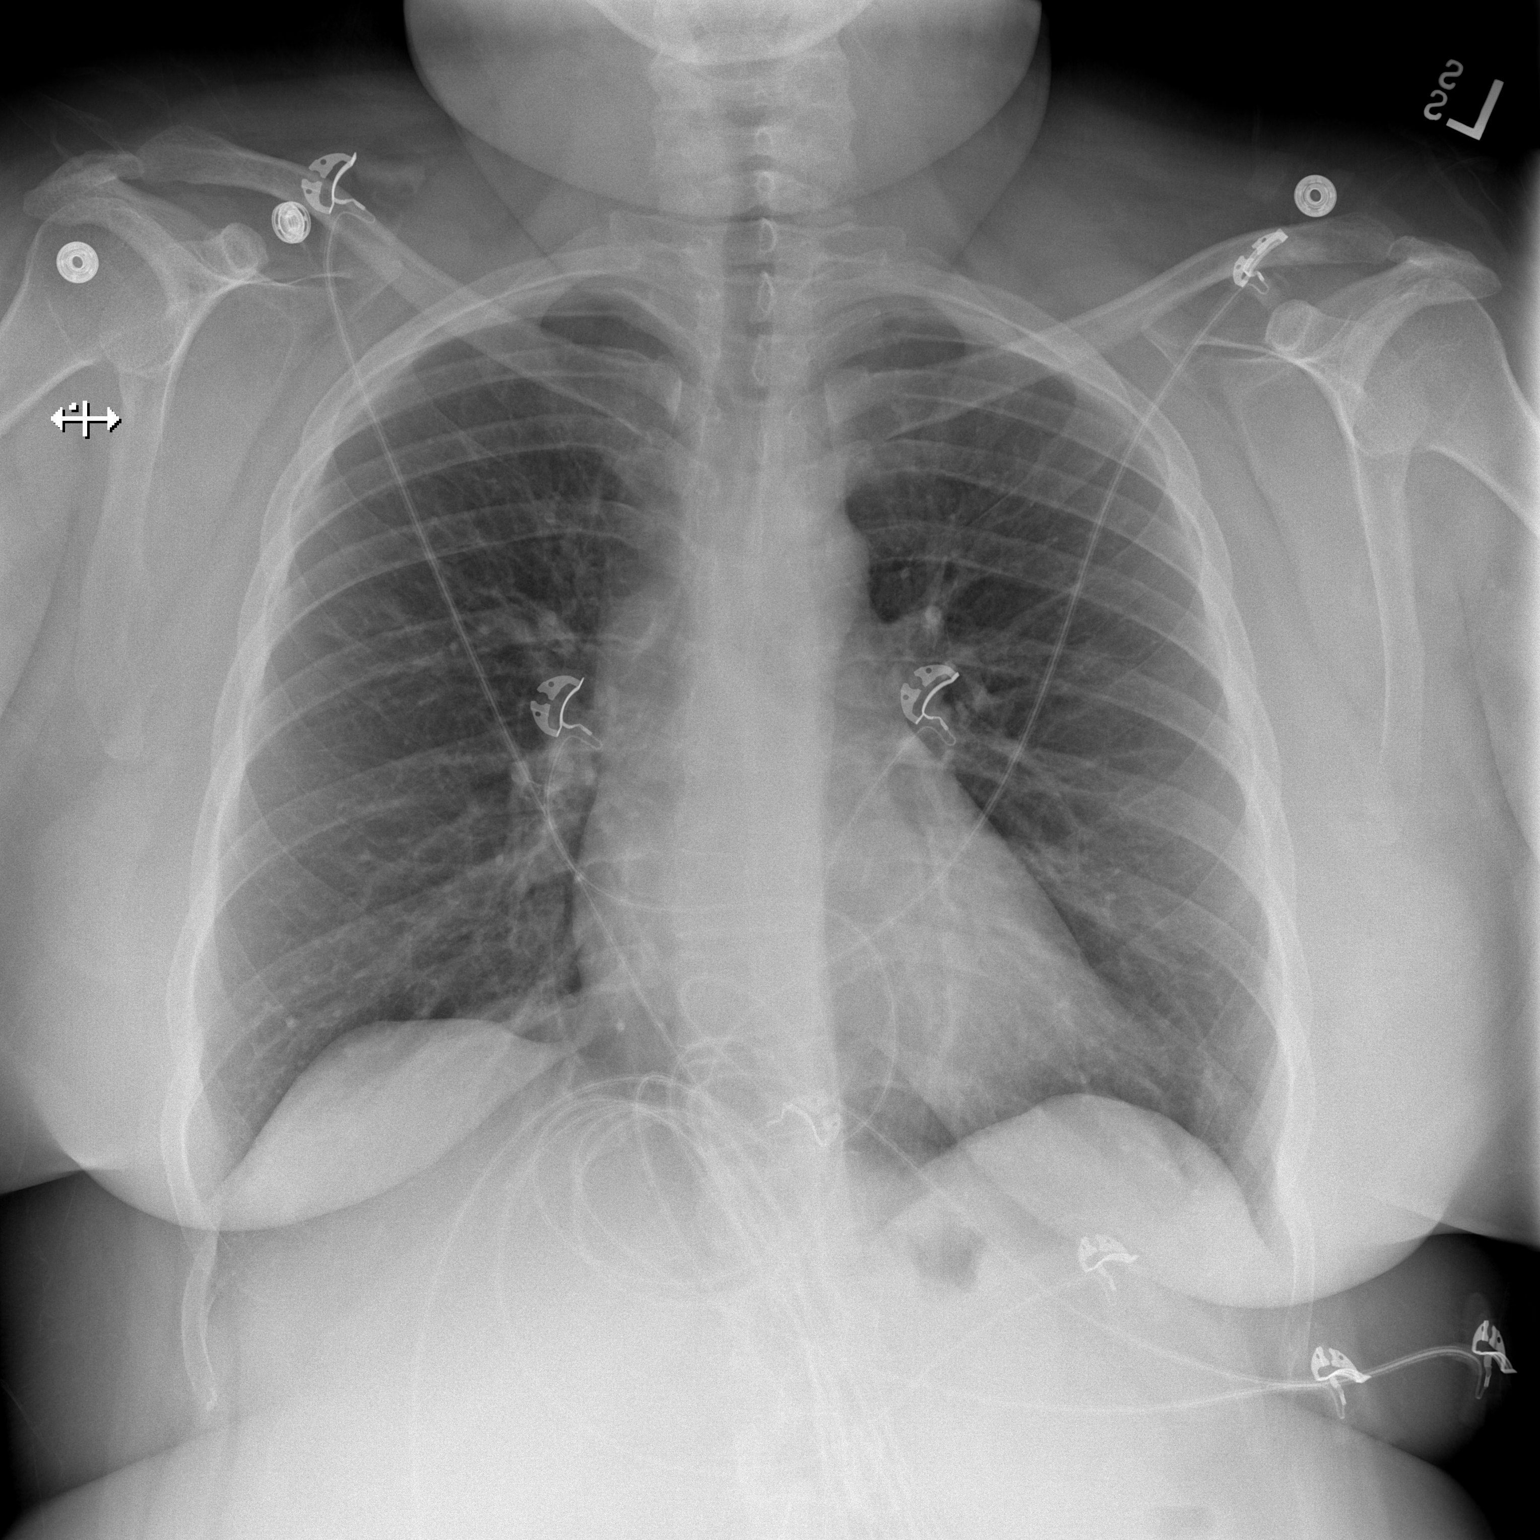

[w chest lat]
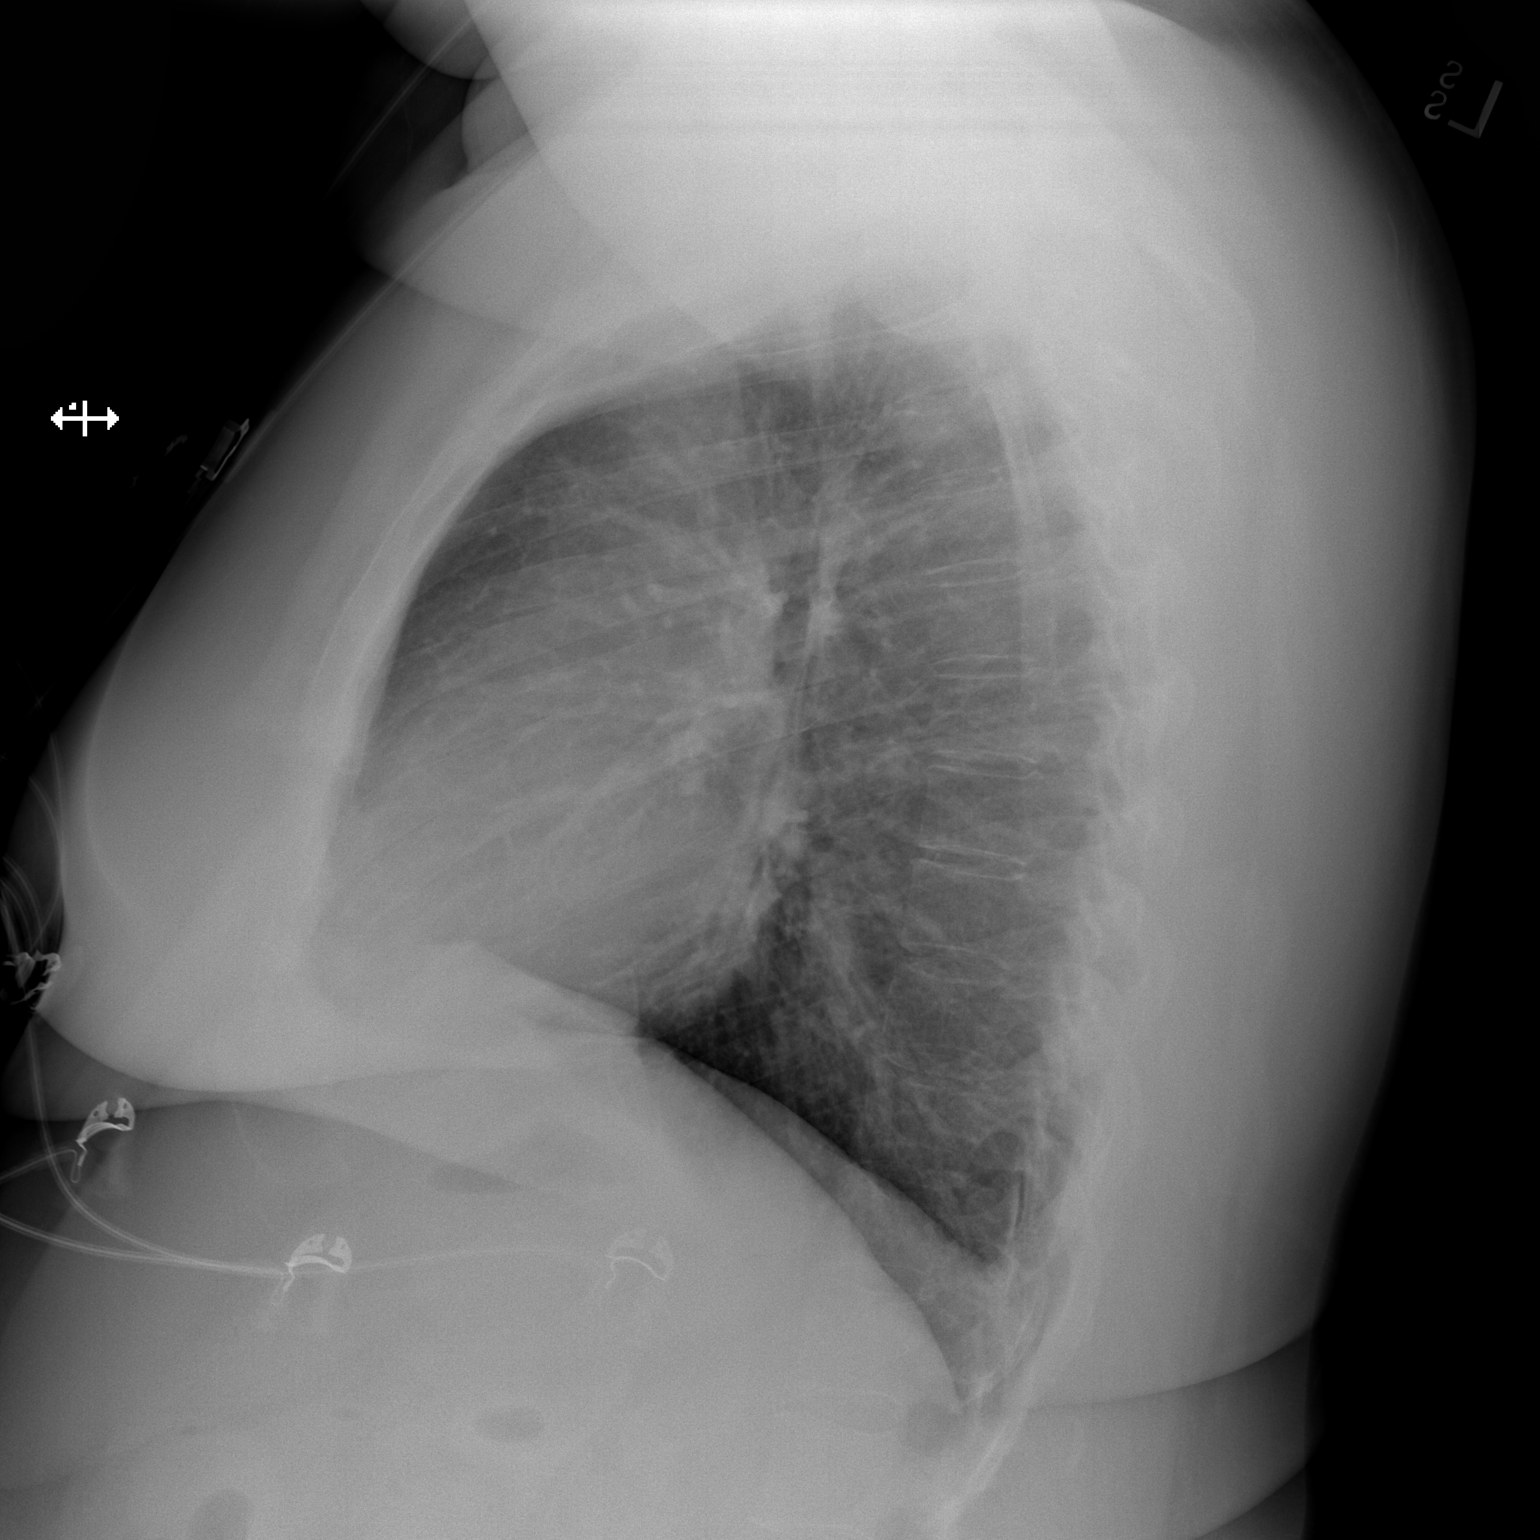

[2 of 2 positions shown; findings below may reference images not displayed]

FINDINGS: The heart size and mediastinal contours are within normal limits.
Faint airspace opacity in the right upper lobe cannot exclude a
subtle pneumonia versus atelectasis. Tiny 2-3 mm nodular densities
at the right lung base, nonspecific in etiology. The visualized
skeletal structures are unremarkable.
IMPRESSION: Minimal airspace opacity in the right upper lobe cannot exclude
pneumonia. Followup PA and lateral chest X-ray is recommended in 3-4
weeks following trial of antibiotic therapy to ensure resolution.
Small peripheral reticulonodular densities in the right lung base
are nonspecific in etiology and may also be re-evaluated on
followup.

## 2016-04-16 MED ORDER — PREDNISONE 50 MG PO TABS
50.0000 mg | ORAL_TABLET | Freq: Every day | ORAL | Status: DC
  Administered 2016-04-17: 50 mg via ORAL
  Filled 2016-04-16: qty 1

## 2016-04-16 MED ORDER — ALBUTEROL (5 MG/ML) CONTINUOUS INHALATION SOLN
INHALATION_SOLUTION | RESPIRATORY_TRACT | Status: AC
  Filled 2016-04-16: qty 20

## 2016-04-16 MED ORDER — ALPRAZOLAM 0.5 MG PO TABS
0.5000 mg | ORAL_TABLET | Freq: Two times a day (BID) | ORAL | Status: DC | PRN
  Administered 2016-04-16 – 2016-04-17 (×3): 0.5 mg via ORAL
  Filled 2016-04-16 (×3): qty 1

## 2016-04-16 MED ORDER — DM-GUAIFENESIN ER 30-600 MG PO TB12
1.0000 | ORAL_TABLET | Freq: Two times a day (BID) | ORAL | Status: DC
  Administered 2016-04-16 – 2016-04-17 (×3): 1 via ORAL
  Filled 2016-04-16 (×3): qty 1

## 2016-04-16 MED ORDER — POTASSIUM CHLORIDE CRYS ER 20 MEQ PO TBCR
40.0000 meq | EXTENDED_RELEASE_TABLET | Freq: Once | ORAL | Status: AC
  Administered 2016-04-16: 40 meq via ORAL
  Filled 2016-04-16: qty 2

## 2016-04-16 MED ORDER — VENLAFAXINE HCL ER 75 MG PO CP24
225.0000 mg | ORAL_CAPSULE | Freq: Every day | ORAL | Status: DC
  Administered 2016-04-16 – 2016-04-17 (×2): 225 mg via ORAL
  Filled 2016-04-16 (×2): qty 3

## 2016-04-16 MED ORDER — ALBUTEROL SULFATE (2.5 MG/3ML) 0.083% IN NEBU
2.5000 mg | INHALATION_SOLUTION | Freq: Four times a day (QID) | RESPIRATORY_TRACT | Status: DC
  Administered 2016-04-17 (×2): 2.5 mg via RESPIRATORY_TRACT
  Filled 2016-04-16 (×2): qty 3

## 2016-04-16 MED ORDER — ALBUTEROL SULFATE (2.5 MG/3ML) 0.083% IN NEBU
5.0000 mg | INHALATION_SOLUTION | Freq: Once | RESPIRATORY_TRACT | Status: AC
  Administered 2016-04-16: 5 mg via RESPIRATORY_TRACT
  Filled 2016-04-16: qty 6

## 2016-04-16 MED ORDER — IPRATROPIUM BROMIDE 0.02 % IN SOLN
RESPIRATORY_TRACT | Status: AC
  Filled 2016-04-16: qty 5

## 2016-04-16 MED ORDER — IPRATROPIUM BROMIDE 0.02 % IN SOLN
1.0000 mg | Freq: Once | RESPIRATORY_TRACT | Status: AC
  Administered 2016-04-16: 1 mg via RESPIRATORY_TRACT

## 2016-04-16 MED ORDER — ALBUTEROL SULFATE (2.5 MG/3ML) 0.083% IN NEBU: 2.5000 mg | INHALATION_SOLUTION | RESPIRATORY_TRACT | Status: DC | PRN

## 2016-04-16 MED ORDER — SODIUM CHLORIDE 0.9 % IV SOLN
INTRAVENOUS | Status: AC
  Administered 2016-04-16: 07:00:00 via INTRAVENOUS

## 2016-04-16 MED ORDER — ALBUTEROL (5 MG/ML) CONTINUOUS INHALATION SOLN
10.0000 mg/h | INHALATION_SOLUTION | RESPIRATORY_TRACT | Status: DC
  Administered 2016-04-16: 10 mg/h via RESPIRATORY_TRACT

## 2016-04-16 MED ORDER — HYDROCODONE-ACETAMINOPHEN 5-325 MG PO TABS
1.0000 | ORAL_TABLET | Freq: Four times a day (QID) | ORAL | Status: DC | PRN
  Administered 2016-04-16 – 2016-04-17 (×3): 1 via ORAL
  Filled 2016-04-16 (×3): qty 1

## 2016-04-16 MED ORDER — LOSARTAN POTASSIUM 50 MG PO TABS
100.0000 mg | ORAL_TABLET | Freq: Every day | ORAL | Status: DC
  Administered 2016-04-16 – 2016-04-17 (×2): 100 mg via ORAL
  Filled 2016-04-16 (×2): qty 2

## 2016-04-16 MED ORDER — FOLIC ACID 1 MG PO TABS
1.0000 mg | ORAL_TABLET | Freq: Every day | ORAL | Status: DC
  Administered 2016-04-16 – 2016-04-17 (×2): 1 mg via ORAL
  Filled 2016-04-16 (×2): qty 1

## 2016-04-16 MED ORDER — SULFAMETHOXAZOLE-TRIMETHOPRIM 800-160 MG PO TABS: 1.0000 | ORAL_TABLET | Freq: Once | ORAL | Status: DC

## 2016-04-16 MED ORDER — LEVOFLOXACIN IN D5W 750 MG/150ML IV SOLN
750.0000 mg | Freq: Every day | INTRAVENOUS | Status: DC
  Administered 2016-04-17: 750 mg via INTRAVENOUS
  Filled 2016-04-16 (×2): qty 150

## 2016-04-16 MED ORDER — SULFAMETHOXAZOLE-TRIMETHOPRIM 400-80 MG/5ML IV SOLN
460.0000 mg | Freq: Three times a day (TID) | INTRAVENOUS | Status: DC
  Administered 2016-04-16 – 2016-04-17 (×4): 460 mg via INTRAVENOUS
  Filled 2016-04-16: qty 28.75
  Filled 2016-04-16: qty 28.8
  Filled 2016-04-16 (×3): qty 28.75

## 2016-04-16 MED ORDER — METHYLPREDNISOLONE SODIUM SUCC 40 MG IJ SOLR
40.0000 mg | Freq: Every day | INTRAMUSCULAR | Status: DC
  Administered 2016-04-16: 40 mg via INTRAVENOUS
  Filled 2016-04-16: qty 1

## 2016-04-16 MED ORDER — ALBUTEROL SULFATE (2.5 MG/3ML) 0.083% IN NEBU: 2.5000 mg | INHALATION_SOLUTION | RESPIRATORY_TRACT | Status: AC | PRN

## 2016-04-16 MED ORDER — ACETAMINOPHEN 325 MG PO TABS
650.0000 mg | ORAL_TABLET | Freq: Four times a day (QID) | ORAL | Status: DC | PRN
  Administered 2016-04-17: 650 mg via ORAL
  Filled 2016-04-16: qty 2

## 2016-04-16 MED ORDER — ONDANSETRON HCL 4 MG PO TABS: 4.0000 mg | ORAL_TABLET | Freq: Four times a day (QID) | ORAL | Status: DC | PRN

## 2016-04-16 MED ORDER — ALBUTEROL SULFATE (2.5 MG/3ML) 0.083% IN NEBU
2.5000 mg | INHALATION_SOLUTION | RESPIRATORY_TRACT | Status: DC
  Administered 2016-04-16 (×4): 2.5 mg via RESPIRATORY_TRACT
  Filled 2016-04-16 (×4): qty 3

## 2016-04-16 MED ORDER — ACETAMINOPHEN 650 MG RE SUPP: 650.0000 mg | Freq: Four times a day (QID) | RECTAL | Status: DC | PRN

## 2016-04-16 MED ORDER — EMTRICITAB-RILPIVIR-TENOFOV AF 200-25-25 MG PO TABS
1.0000 | ORAL_TABLET | Freq: Every day | ORAL | Status: DC
  Administered 2016-04-16 – 2016-04-17 (×2): 1 via ORAL
  Filled 2016-04-16 (×2): qty 1

## 2016-04-16 MED ORDER — ONDANSETRON HCL 4 MG/2ML IJ SOLN: 4.0000 mg | Freq: Four times a day (QID) | INTRAMUSCULAR | Status: DC | PRN

## 2016-04-16 MED ORDER — ENOXAPARIN SODIUM 60 MG/0.6ML ~~LOC~~ SOLN
50.0000 mg | SUBCUTANEOUS | Status: DC
  Administered 2016-04-16 – 2016-04-17 (×2): 50 mg via SUBCUTANEOUS
  Filled 2016-04-16 (×2): qty 0.6

## 2016-04-16 NOTE — Consult Note (Signed)
[facility] for [facility]  Date of Admission:  04/16/2016  Date of Consult:  04/16/2016  Reason for Consult: HIV+, pneumonia Referring Physician: Carolin Sicks  Impression/Recommendation HIV+ Would check CD4, HIV RNA and genotype.  Her prev CD4 does not suggest OI  Pneumonia Check PCP DFA Check influenza swab If she does well, convert to oral soon and home Consider out pt PFTs  Abd pain- Suspect (as she does) that this is muscle strain from coughing. She has an u/s pending   Comment  Thank you so much for this interesting consult,   Lindsay French (pager) [phone] [email or web address]  Lindsay French is an 35 y.o. female.  HPI: 35 yo F with hx of HIV since 2006.   HIV 1 RNA Quant (copies/mL)  Date Value  03/21/2016 3,762 (H)  06/15/2015 <20  05/31/2014 <20   CD4 T Cell Abs (/uL)  Date Value  03/21/2016 790  06/15/2015 990  05/31/2014 1,140   I saw her in f/u 02-2016, she was restarted on her ART (had been off recently). She was given rx for odefsy which she has been taking for the last 3 weeks.   She comes to hospital today with over 1 month of SOB. In ED she reported a temp of 101 a week ago but none since. Her CXR showed "minimal airspace opacities in the RUL" and she had SpO2 of 91% on RA.  She was given nebulizers, started on bactrim and levaquin.   She has a childhood hx of asthma and used an inhaler. She has not used for many years.   Past Medical History:  Diagnosis Date  . Anxiety   . HIV (human immunodeficiency virus infection) ([place])     Past Surgical History:  Procedure Laterality Date  . maxillofacial surgery       Allergies  Allergen Reactions  . Hydrocodone Itching    hot  . Latex Other (See Comments)    As a teenager when using latex condoms she would get a yeast infection.     Medications:  Scheduled: . albuterol  2.5 mg Nebulization Q4H  . dextromethorphan-guaiFENesin  1 tablet Oral BID  .  emtricitabine-rilpivir-tenofovir AF  1 tablet Oral Q breakfast  . enoxaparin (LOVENOX) injection  50 mg Subcutaneous Q24H  . folic acid  1 mg Oral Daily  . levofloxacin (LEVAQUIN) IV  750 mg Intravenous Q0600  . losartan  100 mg Oral Daily  . [START ON 04/17/2016] predniSONE  50 mg Oral Q breakfast  . sulfamethoxazole-trimethoprim  460 mg of trimethoprim Intravenous Q8H  . venlafaxine XR  225 mg Oral Daily    Abtx:  Anti-infectives    Start     Dose/Rate Route Frequency Ordered Stop   04/16/16 0800  emtricitabine-rilpivir-tenofovir AF (ODEFSEY) 200-25-25 MG per tablet 1 tablet     1 tablet Oral Daily with breakfast 04/16/16 0543     04/16/16 0615  sulfamethoxazole-trimethoprim (BACTRIM) 460 mg of trimethoprim in dextrose 5 % 500 mL IVPB     460 mg of trimethoprim 352.5 mL/hr over 90 Minutes Intravenous Every 8 hours 04/16/16 0612     04/16/16 0600  levofloxacin (LEVAQUIN) IVPB 750 mg     750 mg 100 mL/hr over 90 Minutes Intravenous Daily 04/16/16 0543 04/21/16 0559   04/16/16 0430  sulfamethoxazole-trimethoprim (BACTRIM DS,SEPTRA DS) 800-160 MG per tablet 1 tablet  Status:  Discontinued     1 tablet Oral  Once 04/16/16 0429 04/16/16 0429      Total  days of antibiotics: 0 bactrim/levaquin          Social History:  reports that she has quit smoking. Her smoking use included Cigarettes. She has a 4.50 pack-year smoking history. She has never used smokeless tobacco. She reports that she drinks about 4.2 oz of alcohol per week . She reports that she does not use drugs.  Family History  Problem Relation Age of Onset  . 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 see hPI.   Blood pressure (!) 112/51, pulse (!) 108, temperature 98.5 F (36.9 C), temperature source Oral, resp. rate 18, height 5' 2" (1.575 m), weight 97.5 kg  (215 lb), last menstrual period 03/29/2016, SpO2 99 %. General appearance: alert, cooperative and no distress Eyes: negative findings: conjunctivae and sclerae normal and pupils equal, round, reactive to light and accomodation Throat: lips, mucosa, and tongue normal; teeth and gums normal Neck: no adenopathy and supple, symmetrical, trachea midline Lungs: wheezes bilaterally and end-exp Heart: regular rate and rhythm Abdomen: normal findings: bowel sounds normal and soft, non-tender Extremities: edema none   Results for orders placed or performed during the hospital encounter of 04/16/16 (from the past 48 hour(s))  Lipase, blood     Status: None   Collection Time: 04/16/16  2:28 AM  Result Value Ref Range   Lipase 24 11 - 51 U/L  CBC with Differential     Status: Abnormal   Collection Time: 04/16/16  2:29 AM  Result Value Ref Range   WBC 11.2 (H) 4.0 - 10.5 K/uL   RBC 4.09 3.87 - 5.11 MIL/uL   Hemoglobin 13.1 12.0 - 15.0 g/dL   HCT 40.8 36.0 - 46.0 %   MCV 99.8 78.0 - 100.0 fL   MCH 32.0 26.0 - 34.0 pg   MCHC 32.1 30.0 - 36.0 g/dL   RDW 14.9 11.5 - 15.5 %   Platelets 214 150 - 400 K/uL   Neutrophils Relative % 51 %   Neutro Abs 5.6 1.7 - 7.7 K/uL   Lymphocytes Relative 42 %   Lymphs Abs 4.7 (H) 0.7 - 4.0 K/uL   Monocytes Relative 7 %   Monocytes Absolute 0.8 0.1 - 1.0 K/uL   Eosinophils Relative 0 %   Eosinophils Absolute 0.0 0.0 - 0.7 K/uL   Basophils Relative 0 %   Basophils Absolute 0.0 0.0 - 0.1 K/uL  Basic metabolic panel     Status: Abnormal   Collection Time: 04/16/16  2:29 AM  Result Value Ref Range   Sodium 140 135 - 145 mmol/L   Potassium 3.3 (L) 3.5 - 5.1 mmol/L   Chloride 101 101 - 111 mmol/L   CO2 30 22 - 32 mmol/L   Glucose, Bld 148 (H) 65 - 99 mg/dL   BUN 6 6 - 20 mg/dL   Creatinine, Ser 0.82 0.44 - 1.00 mg/dL   Calcium 8.6 (L) 8.9 - 10.3 mg/dL   GFR calc non Af Amer >60 >60 mL/min   GFR calc Af Amer >60 >60 mL/min    Comment: (NOTE) The eGFR has  been calculated using the CKD EPI equation. This calculation has not been validated in all clinical situations. eGFR's persistently <60 mL/min signify possible Chronic Kidney Disease.    Anion gap 9 5 - 15  Lactate dehydrogenase     Status: Abnormal  Collection Time: 04/16/16  4:39 AM  Result Value Ref Range   LDH 331 (H) 98 - 192 U/L  hCG, serum, qualitative     Status: None   Collection Time: 04/16/16  4:39 AM  Result Value Ref Range   Preg, Serum NEGATIVE NEGATIVE    Comment:        THE SENSITIVITY OF THIS METHODOLOGY IS >10 mIU/mL.   Hepatic function panel     Status: Abnormal   Collection Time: 04/16/16  4:39 AM  Result Value Ref Range   Total Protein 7.0 6.5 - 8.1 g/dL   Albumin 3.7 3.5 - 5.0 g/dL   AST 109 (H) 15 - 41 U/L   ALT 139 (H) 14 - 54 U/L   Alkaline Phosphatase 94 38 - 126 U/L   Total Bilirubin 0.5 0.3 - 1.2 mg/dL   Bilirubin, Direct <0.1 (L) 0.1 - 0.5 mg/dL   Indirect Bilirubin NOT CALCULATED 0.3 - 0.9 mg/dL  CBC     Status: None   Collection Time: 04/16/16  6:35 AM  Result Value Ref Range   WBC 10.2 4.0 - 10.5 K/uL   RBC 3.88 3.87 - 5.11 MIL/uL   Hemoglobin 12.4 12.0 - 15.0 g/dL   HCT 38.3 36.0 - 46.0 %   MCV 98.7 78.0 - 100.0 fL   MCH 32.0 26.0 - 34.0 pg   MCHC 32.4 30.0 - 36.0 g/dL   RDW 15.0 11.5 - 15.5 %   Platelets 208 150 - 400 K/uL  Creatinine, serum     Status: None   Collection Time: 04/16/16  6:35 AM  Result Value Ref Range   Creatinine, Ser 0.70 0.44 - 1.00 mg/dL   GFR calc non Af Amer >60 >60 mL/min   GFR calc Af Amer >60 >60 mL/min    Comment: (NOTE) The eGFR has been calculated using the CKD EPI equation. This calculation has not been validated in all clinical situations. eGFR's persistently <60 mL/min signify possible Chronic Kidney Disease.    No results found for: SDES, SPECREQUEST, CULT, REPTSTATUS Dg Chest 2 View  Result Date: 04/16/2016 CLINICAL DATA:  Cough and wheeze for the past 7 weeks. EXAM: CHEST  2 VIEW  COMPARISON:  None. FINDINGS: The heart size and mediastinal contours are within normal limits. Faint airspace opacity in the right upper lobe cannot exclude a subtle pneumonia versus atelectasis. Tiny 2-3 mm nodular densities at the right lung base, nonspecific in etiology. The visualized skeletal structures are unremarkable. IMPRESSION: Minimal airspace opacity in the right upper lobe cannot exclude pneumonia. Followup PA and lateral chest X-ray is recommended in 3-4 weeks following trial of antibiotic therapy to ensure resolution. Small peripheral reticulonodular densities in the right lung base are nonspecific in etiology and may also be re-evaluated on followup. Electronically Signed   By: Ashley Royalty M.D.   On: 04/16/2016 03:28   No results found for this or any previous visit (from the past 240 hour(s)).    04/16/2016, 3:50 PM     LOS: 0 days    Records and images were personally reviewed where available.

## 2016-04-16 NOTE — H&P (Signed)
History and Physical    Lindsay Shellerrika Spearing KGM:[id number] DOB: 13-Oct-1980 DOA: 04/16/2016  PCP: Lolita PatellaEADE,ROBERT ALEXANDER, MD  Patient coming from: Home.  Chief Complaint: Shortness of breath.  HPI: Lindsay French is a 35 y.o. female with history of HIV presents to the ER because of shortness of breath and nonproductive cough. Patient has been having wheezing and shortness of breath since November 10, last month. Patient at that time had gone to urgent care and was prescribed inhalers and Z-Pak which patient did not take. Patient's breathing slowly improved over the next 2 weeks but had change in her voice. After December 1st week patient again started having wheezing and cough and on December 18 had fever 101F and at that time patient took Z-Pak which was prescribed last month. Patient had gone to her PCP next day and was prescribed inhaler. Despite taking these patient still was short of breath with nonproductive cough and presented to the ER. Chest x-ray in the ER shows possible pneumonia. Lab work showed elevated LDH and patient was hypoxic. Patient is started on empiric antibiotics for pneumonia along with nebulizer treatment for bronchitis.   Patient's last LDH last month was 790.  ED Course: Chest x-ray shows concerning for pneumonia. LDH is elevated.  Review of Systems: As per HPI, rest all negative.   Past Medical History:  Diagnosis Date  . Anxiety   . HIV (human immunodeficiency virus infection) ([facility])     Past Surgical History:  Procedure Laterality Date  . maxillofacial surgery       reports that she has quit smoking. Her smoking use included Cigarettes. She has a 4.50 pack-year smoking history. She has never used smokeless tobacco. She reports that she drinks about 4.2 oz of alcohol per week . She reports that she does not use drugs.  Allergies  Allergen Reactions  . Hydrocodone Itching    hot  . Latex Other (See Comments)    As a teenager when using latex condoms she would  get a yeast infection.     Family History  Problem Relation Age of Onset  . Hypothyroidism Mother     Prior to Admission medications   Medication Sig Start Date End Date Taking? Authorizing Provider  ALPRAZolam Prudy Feeler(XANAX) 0.5 MG tablet Take 1 tablet by mouth 2 (two) times daily as needed for anxiety.  12/21/15  Yes Historical Provider, MD  diphenhydramine-acetaminophen (TYLENOL PM) 25-500 MG TABS tablet Take 2 tablets by mouth at bedtime as needed (sleep).   Yes Historical Provider, MD  emtricitabine-rilpivir-tenofovir AF (ODEFSEY) 200-25-25 MG TABS tablet Take 1 tablet by mouth daily with breakfast. 03/21/16  Yes Ginnie SmartJeffrey C Hatcher, MD  fexofenadine-pseudoephedrine (ALLEGRA-D) 60-120 MG 12 hr tablet Take 1 tablet by mouth daily as needed (allergies).   Yes Historical Provider, MD  folic acid (FOLVITE) 1 MG tablet Take 1 mg by mouth daily.   Yes Historical Provider, MD  HYDROMET 5-1.5 MG/5ML syrup Take 5 mLs by mouth every 6 (six) hours as needed for cough. 04/11/16  Yes Historical Provider, MD  losartan (COZAAR) 100 MG tablet Take 1 tablet by mouth daily. 02/19/16  Yes Historical Provider, MD  naproxen sodium (ANAPROX) 220 MG tablet Take 660 mg by mouth 2 (two) times daily as needed (pain).   Yes Historical Provider, MD  venlafaxine XR (EFFEXOR-XR) 75 MG 24 hr capsule Take 3 capsules by mouth daily. 12/21/15  Yes Historical Provider, MD    Physical Exam: Vitals:   04/16/16 [id number] 04/16/16 [id number] 04/16/16 0415 04/16/16  0525  BP:  92/63  108/64  Pulse:  111  96  Resp:  15  17  Temp:   99 F (37.2 C)   TempSrc:   Rectal   SpO2: 94% 96%  97%  Weight:      Height:          Constitutional: Moderately built and nourished. Vitals:   04/16/16 0316 04/16/16 0338 04/16/16 0415 04/16/16 0525  BP:  92/63  108/64  Pulse:  111  96  Resp:  15  17  Temp:   99 F (37.2 C)   TempSrc:   Rectal   SpO2: 94% 96%  97%  Weight:      Height:       Eyes: Anicteric no pallor. ENMT: No discharge from  the ears eyes nose or mouth. Neck: No mass felt. No JVD appreciated. Respiratory: Bilateral expiratory wheeze and no crepitations. Cardiovascular: S1-S2 heard no murmurs appreciated. Abdomen: Soft nontender bowel sounds present. No guarding or rigidity. Musculoskeletal: No edema. No joint effusion. Skin: No rash. Skin appears warm. Neurologic: Alert awake oriented to time place and person. Moves all extremities. Psychiatric: Appears normal. Normal affect.   Labs on Admission: I have personally reviewed following labs and imaging studies  CBC:  Recent Labs Lab 04/16/16 0229  WBC 11.2*  NEUTROABS 5.6  HGB 13.1  HCT 40.8  MCV 99.8  PLT 214   Basic Metabolic Panel:  Recent Labs Lab 04/16/16 0229  NA 140  K 3.3*  CL 101  CO2 30  GLUCOSE 148*  BUN 6  CREATININE 0.82  CALCIUM 8.6*   GFR: Estimated Creatinine Clearance: 105.5 mL/min (by C-G formula based on SCr of 0.82 mg/dL). Liver Function Tests:  Recent Labs Lab 04/16/16 0439  AST 109*  ALT 139*  ALKPHOS 94  BILITOT 0.5  PROT 7.0  ALBUMIN 3.7   No results for input(s): LIPASE, AMYLASE in the last 168 hours. No results for input(s): AMMONIA in the last 168 hours. Coagulation Profile: No results for input(s): INR, PROTIME in the last 168 hours. Cardiac Enzymes: No results for input(s): CKTOTAL, CKMB, CKMBINDEX, TROPONINI in the last 168 hours. BNP (last 3 results) No results for input(s): PROBNP in the last 8760 hours. HbA1C: No results for input(s): HGBA1C in the last 72 hours. CBG: No results for input(s): GLUCAP in the last 168 hours. Lipid Profile: No results for input(s): CHOL, HDL, LDLCALC, TRIG, CHOLHDL, LDLDIRECT in the last 72 hours. Thyroid Function Tests: No results for input(s): TSH, T4TOTAL, FREET4, T3FREE, THYROIDAB in the last 72 hours. Anemia Panel: No results for input(s): VITAMINB12, FOLATE, FERRITIN, TIBC, IRON, RETICCTPCT in the last 72 hours. Urine analysis: No results found for:  COLORURINE, APPEARANCEUR, LABSPEC, PHURINE, GLUCOSEU, HGBUR, BILIRUBINUR, KETONESUR, PROTEINUR, UROBILINOGEN, NITRITE, LEUKOCYTESUR Sepsis Labs: @LABRCNTIP (procalcitonin:4,lacticidven:4) )No results found for this or any previous visit (from the past 240 hour(s)).   Radiological Exams on Admission: Dg Chest 2 View  Result Date: 04/16/2016 CLINICAL DATA:  Cough and wheeze for the past 7 weeks. EXAM: CHEST  2 VIEW COMPARISON:  None. FINDINGS: The heart size and mediastinal contours are within normal limits. Faint airspace opacity in the right upper lobe cannot exclude a subtle pneumonia versus atelectasis. Tiny 2-3 mm nodular densities at the right lung base, nonspecific in etiology. The visualized skeletal structures are unremarkable. IMPRESSION: Minimal airspace opacity in the right upper lobe cannot exclude pneumonia. Followup PA and lateral chest X-ray is recommended in 3-4 weeks following trial of antibiotic therapy to  ensure resolution. Small peripheral reticulonodular densities in the right lung base are nonspecific in etiology and may also be re-evaluated on followup. Electronically Signed   By: Tollie Ethavid  Kwon M.D.   On: 04/16/2016 03:28    EKG: Independently reviewed. Sinus tachycardia.  Assessment/Plan Principal Problem:   Acute respiratory failure with hypoxia ([facility]) Active Problems:   Community acquired pneumonia of right upper lobe of lung ([facility])   Acute bronchitis   Hypertension    1. Acute respiratory failure with hypoxia probably secondary to pneumonia and bronchitis - at this time since patient's LDH is elevated, I have placed patient on Bactrim per pharmacy for possible pneumocystis however patient's last CD4 count was 769 last month. Also on Levaquin for community acquired pneumonia since patient was recently on Z-Pak. Check influenza PCR urine for Legionella and strep antigen, sputum cultures. May consult infectious disease in a.m. for further recommendations. 2. Acute bronchitis  - I have placed patient on nebulizer, Pulmicort and steroids. 3. Hypertension - continue ARB. 4. HIV - last CD4 count last month was 769. Continue antiretroviral. 5. Elevated LFTs - patient states she has chronically elevated LFTs being followed by primary care. 6. Patient complains of left upper quadrant pain probably related to her cough. Will check sonogram of the abdomen and also lipase levels.  DVT prophylaxis:  Lovenox. Code Status:  Full code.  Family Communication:  Discussed with patient.  Disposition Plan:  Home.  Consults called:  None.  Admission status:  Inpatient.    Eduard ClosKAKRAKANDY,Cadence Minton N. MD [facility] Pager [phone].  If 7PM-7AM, please contact night-coverage [email or web address] Password [email or web address]  04/16/2016, 5:44 AM

## 2016-04-16 NOTE — Progress Notes (Signed)
PROGRESS NOTE    Lindsay French  WUX:[id number] DOB: 1980-07-15 DOA: 04/16/2016 PCP: Lolita PatellaEADE,ROBERT ALEXANDER, MD   Brief Narrative: 35 y.o. female with history of HIV presents to the ER because of shortness of breath and nonproductive cough. Patient has been having wheezing and shortness of breath since November 10, last month. Patient at that time had gone to urgent care and was prescribed inhalers and Z-Pak which patient did not take.Chest x-ray in the ER shows possible pneumonia. Lab work showed elevated LDH and patient was hypoxic. Patient is started on empiric antibiotics for pneumonia along with nebulizer treatment for bronchitis.   Assessment & Plan:  #  Acute hypoxic respiratory failure likely in the setting of pneumonia: -Chest x-ray consistent with right upper lobe  opacity concerning for pneumonia. Patient with elevated LDH, concerning for possible pneumocystis infection. However my as per patient's H&P last CD4 count was 769 about a month ago. -Currently on IV Bactrim per possible PCP infection - Continue Levaquin for possible commonly acquired pneumonia. -Follow-up influenza, Legionella, strep pneumoniae -Currently requiring 2 L about sent to maintain saturation. -Continue albuterol, Atrovent. Added Mucinex.  #Possible right upper lobe pneumonia: On antibiotics, steroid and nebulization treatment. I will switch Solu-Medrol to short course of oral prednisone.  #Abdominal pain: Patient reported left-sided abdominal pain especially while coughing. Most likely the pain is musculoskeletal. Abdominal exam benign. Patient has no distention or tenderness. Lipase is not elevated. Checking ultrasound of abdomen today. Adjusted pain medication.  #History of HIV on antiviral medication: Continue home antiviral medications.  infectious disease consult called and spoke with Dr.Hatcher.  #Essential hypertension: Blood pressure acceptable. Continue losartan.  #Anxiety depression: Continue  Effexor. Mood is stable today.   Principal Problem:   Acute respiratory failure with hypoxia (HCC) Active Problems:   Community acquired pneumonia of right upper lobe of lung (HCC)   Acute bronchitis   Hypertension  DVT prophylaxis: Lovenox subcutaneous. Code Status: Full code Family Communication: No family present at bedside Disposition Plan: Likely discharge home in 1-2 days. Consultants:   ID  Procedures: No Antimicrobials: IV Bactrim and Levaquin.  Subjective: Patient was seen and examined at bedside. Patient reported shortness of breath and cough is better. Still hypoxic requiring 2 L of oxygen. Has left-sided abdominal pain especially with coughing. Denied nausea vomiting or diarrhea. Denies urinary symptoms.   Objective: Vitals:   04/16/16 0525 04/16/16 0530 04/16/16 0630 04/16/16 0842  BP: 108/64 108/76 (!) 113/57   Pulse: 96 106 100   Resp: 17  18   Temp:   98.5 F (36.9 C)   TempSrc:   Oral   SpO2: 97% 97% 98% 98%  Weight:      Height:        Intake/Output Summary (Last 24 hours) at 04/16/16 1223 Last data filed at 04/16/16 0900  Gross per 24 hour  Intake              240 ml  Output                0 ml  Net              240 ml   Filed Weights   04/16/16 0123 04/16/16 0130  Weight: 101.6 kg (224 lb) 97.5 kg (215 lb)    Examination:  General exam: Obese female lying in bed, Appears calm and comfortable  Respiratory system: Clear to auscultation. Respiratory effort normal. No wheezing or crackle Cardiovascular system: S1 & S2 heard, RRR.  No pedal  edema. Gastrointestinal system: Abdomen is nondistended, soft and nontender. Normal bowel sounds heard. Central nervous system: Alert and oriented. No focal neurological deficits. Extremities: Symmetric 5 x 5 power. Skin: No rashes, lesions or ulcers Psychiatry: Judgement and insight appear normal. Mood & affect appropriate.     Data Reviewed: I have personally reviewed following labs and imaging  studies  CBC:  Recent Labs Lab 04/16/16 0229 04/16/16 0635  WBC 11.2* 10.2  NEUTROABS 5.6  --   HGB 13.1 12.4  HCT 40.8 38.3  MCV 99.8 98.7  PLT 214 208   Basic Metabolic Panel:  Recent Labs Lab 04/16/16 0229 04/16/16 0635  NA 140  --   K 3.3*  --   CL 101  --   CO2 30  --   GLUCOSE 148*  --   BUN 6  --   CREATININE 0.82 0.70  CALCIUM 8.6*  --    GFR: Estimated Creatinine Clearance: 108.1 mL/min (by C-G formula based on SCr of 0.7 mg/dL). Liver Function Tests:  Recent Labs Lab 04/16/16 0439  AST 109*  ALT 139*  ALKPHOS 94  BILITOT 0.5  PROT 7.0  ALBUMIN 3.7    Recent Labs Lab 04/16/16 0228  LIPASE 24   No results for input(s): AMMONIA in the last 168 hours. Coagulation Profile: No results for input(s): INR, PROTIME in the last 168 hours. Cardiac Enzymes: No results for input(s): CKTOTAL, CKMB, CKMBINDEX, TROPONINI in the last 168 hours. BNP (last 3 results) No results for input(s): PROBNP in the last 8760 hours. HbA1C: No results for input(s): HGBA1C in the last 72 hours. CBG: No results for input(s): GLUCAP in the last 168 hours. Lipid Profile: No results for input(s): CHOL, HDL, LDLCALC, TRIG, CHOLHDL, LDLDIRECT in the last 72 hours. Thyroid Function Tests: No results for input(s): TSH, T4TOTAL, FREET4, T3FREE, THYROIDAB in the last 72 hours. Anemia Panel: No results for input(s): VITAMINB12, FOLATE, FERRITIN, TIBC, IRON, RETICCTPCT in the last 72 hours. Sepsis Labs: No results for input(s): PROCALCITON, LATICACIDVEN in the last 168 hours.  No results found for this or any previous visit (from the past 240 hour(s)).       Radiology Studies: Dg Chest 2 View  Result Date: 04/16/2016 CLINICAL DATA:  Cough and wheeze for the past 7 weeks. EXAM: CHEST  2 VIEW COMPARISON:  None. FINDINGS: The heart size and mediastinal contours are within normal limits. Faint airspace opacity in the right upper lobe cannot exclude a subtle pneumonia versus  atelectasis. Tiny 2-3 mm nodular densities at the right lung base, nonspecific in etiology. The visualized skeletal structures are unremarkable. IMPRESSION: Minimal airspace opacity in the right upper lobe cannot exclude pneumonia. Followup PA and lateral chest X-ray is recommended in 3-4 weeks following trial of antibiotic therapy to ensure resolution. Small peripheral reticulonodular densities in the right lung base are nonspecific in etiology and may also be re-evaluated on followup. Electronically Signed   By: Tollie Ethavid  Kwon M.D.   On: 04/16/2016 03:28        Scheduled Meds: . albuterol  2.5 mg Nebulization Q4H  . dextromethorphan-guaiFENesin  1 tablet Oral BID  . emtricitabine-rilpivir-tenofovir AF  1 tablet Oral Q breakfast  . enoxaparin (LOVENOX) injection  50 mg Subcutaneous Q24H  . folic acid  1 mg Oral Daily  . ipratropium      . levofloxacin (LEVAQUIN) IV  750 mg Intravenous Q0600  . losartan  100 mg Oral Daily  . methylPREDNISolone (SOLU-MEDROL) injection  40 mg Intravenous Daily  .  sulfamethoxazole-trimethoprim  460 mg of trimethoprim Intravenous Q8H  . venlafaxine XR  225 mg Oral Daily   Continuous Infusions: . sodium chloride 50 mL/hr at 04/16/16 0707     LOS: 0 days    Lindsay Mickley Jaynie Collins, MD [facility] Pager [phone]  If 7PM-7AM, please contact night-coverage [email or web address] Password [facility] 04/16/2016, 12:23 PM

## 2016-04-16 NOTE — ED Provider Notes (Signed)
[facility]-EMERGENCY DEPT Provider Note   CSN: [id number] Arrival date & time: 04/16/16  0120  By signing my name below, I, Lindsay French, attest that this documentation has been prepared under the direction and in the presence of TXU CorpHannah Chessie Neuharth, PA-C. Electronically Signed: Linna Darnerussell French, Scribe. 04/16/2016. 1:51 AM.  History   Chief Complaint Chief Complaint  Patient presents with  . Cough  . Wheezing    The history is provided by the patient and medical records. No language interpreter was used.     HPI Comments: Lindsay French is a 35 y.o. female who presents to the Emergency Department complaining of persistent, worsening, cough and wheezing for the last seven weeks. She states she was diagnosed with bronchitis seven weeks ago and has had a cough and wheezing since. She notes she was recently off of her HIV medications for one month because she could not make an appointment with her doctor to get them filled. She contracted bronchitis when she was off of her HIV medications. She has been taking her HIV medications as prescribed for the past 3 weeks. Pt reports a "hard mass" in her left upper abdomen and endorses severe pain in this region which is exacerbated when she coughs. She notes she has had intermittent fevers in association with her cough/wheezing; she reports a fever of 101 one week ago but has not measured a fever since then. She was evaluated for her ongoing cough and wheezing a few weeks ago at a [facility] and was prescribed an inhaler which she is unable to afford; she was also prescribed a z-pak which she took 2 weeks after the time of prescription with no improvement of her symptoms. She has additionally used prescription cough syrup with codeine with transient relief of her cough and wheezing. No chest x-ray PTA. 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 She does not smoke or use illicit drugs but endorses mild daily alcohol use. No h/o asthma or COPD. She  denies chest pain, numbness, weakness, or any other associated symptoms.  Record review shows labs on 03/21/2016. At that time her HIV 1 RNA Quant was 3762 and her CD4 count was 790. Patient reports since that time she has been back on her medications and has not missed any doses in the last 3 weeks.  Past Medical History:  Diagnosis Date  . Anxiety   . HIV (human immunodeficiency virus infection) [facility](HCC)     Patient Active Problem List   Diagnosis Date Noted  . Pneumonia 04/16/2016  . Obesity 03/21/2016  . Screening examination for venereal disease 03/30/2014  . Encounter for long-term (current) use of medications 03/30/2014  . Idiopathic thrombocytopenic purpura (ITP) 11/14/2010  . Human immunodeficiency virus (HIV) disease (HCC) 11/14/2010  . ADHD (attention deficit hyperactivity disorder) 11/14/2010    Past Surgical History:  Procedure Laterality Date  . maxillofacial surgery      OB History    No data available       Home Medications    Prior to Admission medications   Medication Sig Start Date End Date Taking? Authorizing Provider  ALPRAZolam Prudy Feeler(XANAX) 0.5 MG tablet Take 1 tablet by mouth 2 (two) times daily as needed. 12/21/15   Historical Provider, MD  emtricitabine-rilpivir-tenofovir AF (ODEFSEY) 200-25-25 MG TABS tablet Take 1 tablet by mouth daily with breakfast. 03/21/16   Ginnie SmartJeffrey C Hatcher, MD  folic acid (FOLVITE) 1 MG tablet Take 1 mg by mouth daily.    Historical Provider, MD  losartan (  COZAAR) 100 MG tablet Take 1 tablet by mouth daily. 02/19/16   Historical Provider, MD  venlafaxine XR (EFFEXOR-XR) 75 MG 24 hr capsule Take 3 capsules by mouth daily. 12/21/15   Historical Provider, MD    Family History Family History  Problem Relation Age of Onset  . Hypothyroidism Mother     Social History Social History  Substance Use Topics  . Smoking status: Former Smoker    Packs/day: 0.30    Years: 15.00    Types: Cigarettes  . Smokeless tobacco: Never Used  .  Alcohol use 4.2 oz/week    7 Glasses of wine per week     Comment: wine  2-3 times a week     Allergies   Hydrocodone and Latex   Review of Systems Review of Systems  Constitutional: Positive for fever (intermittent).  Respiratory: Positive for cough and wheezing.   Cardiovascular: Negative for chest pain.  Gastrointestinal: Positive for abdominal pain (LUQ).  Neurological: Negative for weakness and numbness.  All other systems reviewed and are negative.    Physical Exam Updated Vital Signs BP 92/67 (BP Location: Left Arm)   Pulse 96   Temp 98.8 F (37.1 C) (Oral)   Resp 19   Ht 5\' 2"  (1.575 m)   Wt 215 lb (97.5 kg)   LMP 03/29/2016 (Exact Date) Comment: spotting only since then.  SpO2 92%   BMI 39.32 kg/m   Physical Exam  Constitutional: She appears well-developed and well-nourished. No distress.  Awake, alert, nontoxic appearance  HENT:  Head: Normocephalic and atraumatic.  Mouth/Throat: Oropharynx is clear and moist. No oropharyngeal exudate.  Eyes: Conjunctivae are normal. No scleral icterus.  Neck: Normal range of motion. Neck supple.  Cardiovascular: Normal rate, regular rhythm and intact distal pulses.   Pulmonary/Chest: Accessory muscle usage present. Tachypnea noted. No respiratory distress. She has decreased breath sounds. She has wheezes. She has rhonchi.  Equal chest expansion  Abdominal: Soft. Bowel sounds are normal. She exhibits no mass. There is no tenderness. There is no rebound and no guarding.  LUQ: very mild tenderness, no palpable hernia, no rebound or guarding.  Musculoskeletal: Normal range of motion. She exhibits no edema.  Neurological: She is alert.  Speech is clear and goal oriented Moves extremities without ataxia  Skin: Skin is warm and dry. She is not diaphoretic.  Psychiatric: She has a normal mood and affect.  Nursing note and vitals reviewed.    ED Treatments / Results  Labs (all labs ordered are listed, but only abnormal  results are displayed) Labs Reviewed  CBC WITH DIFFERENTIAL/PLATELET - Abnormal; Notable for the following:       Result Value   WBC 11.2 (*)    Lymphs Abs 4.7 (*)    All other components within normal limits  BASIC METABOLIC PANEL - Abnormal; Notable for the following:    Potassium 3.3 (*)    Glucose, Bld 148 (*)    Calcium 8.6 (*)    All other components within normal limits  LACTATE DEHYDROGENASE  HCG, SERUM, QUALITATIVE  HEPATIC FUNCTION PANEL  I-STAT BETA HCG BLOOD, ED ([facility], [facility], [facility] ONLY)    EKG  EKG Interpretation  Date/Time:  Monday April 16 2016 02:07:27 EST Ventricular Rate:  103 PR Interval:    QRS Duration: 98 QT Interval:  364 QTC Calculation: 477 R Axis:   52 Text Interpretation:  Sinus tachycardia Abnormal inferior Q waves Borderline prolonged QT interval No old tracing to compare Confirmed by Con[facility] Healthcare Associates IncGLICK  MD, DAVID ([id number]) on 04/16/2016 2:22:29 AM       Radiology Dg Chest 2 View  Result Date: 04/16/2016 CLINICAL DATA:  Cough and wheeze for the past 7 weeks. EXAM: CHEST  2 VIEW COMPARISON:  None. FINDINGS: The heart size and mediastinal contours are within normal limits. Faint airspace opacity in the right upper lobe cannot exclude a subtle pneumonia versus atelectasis. Tiny 2-3 mm nodular densities at the right lung base, nonspecific in etiology. The visualized skeletal structures are unremarkable. IMPRESSION: Minimal airspace opacity in the right upper lobe cannot exclude pneumonia. Followup PA and lateral chest X-ray is recommended in 3-4 weeks following trial of antibiotic therapy to ensure resolution. Small peripheral reticulonodular densities in the right lung base are nonspecific in etiology and may also be re-evaluated on followup. Electronically Signed   By: Tollie Eth M.D.   On: 04/16/2016 03:28    Procedures Procedures (including critical care time)  DIAGNOSTIC STUDIES: Oxygen Saturation is 92% on RA, low by my interpretation.    COORDINATION OF  CARE: 2:03 AM Discussed treatment plan with pt at bedside and pt agreed to plan.  Medications Ordered in ED Medications  albuterol (PROVENTIL,VENTOLIN) solution continuous neb (10 mg/hr Nebulization New Bag/Given 04/16/16 0159)  ipratropium (ATROVENT) 0.02 % nebulizer solution (  Not Given 04/16/16 0158)  albuterol (PROVENTIL) (2.5 MG/3ML) 0.083% nebulizer solution 5 mg (5 mg Nebulization Given 04/16/16 0145)  ipratropium (ATROVENT) nebulizer solution 1 mg (1 mg Nebulization Given 04/16/16 0200)  potassium chloride SA (K-DUR,KLOR-CON) CR tablet 40 mEq (40 mEq Oral Given 04/16/16 0441)     Initial Impression / Assessment and Plan / ED Course  I have reviewed the triage vital signs and the nursing notes.  Pertinent labs & imaging results that were available during my care of the patient were reviewed by me and considered in my medical decision making (see chart for details).  Clinical Course as of Apr 16 454  Mon Apr 16, 2016  0430 Patient with persistent wheezing and rhonchi.  Hypoxia to 86% on room air after continuous nebulizer. Now on 4 L of oxygen via nasal cannula to maintain oxygen saturations. Chest x-ray shows pneumonia. Concern for PCP pneumonia due to increased viral load while being off of her medications.  Patient will need admission.  [HM]    Clinical Course User Index [HM] Dierdre Forth, PA-C    Patient presents with increased work of breathing. Wheezing in all fields and accessory muscle usage. Patient history of HIV and was off her medications for approximately one month restarting them 3 weeks ago. Chest x-ray now with evidence of right upper lobe pneumonia.  Concern for possible PCP pneumonia due to missing medications and history of HIV. After continuous nebulizer patient with persistent hypoxia will need admission for further evaluation.   Final Clinical Impressions(s) / ED Diagnoses   Final diagnoses:  Community acquired pneumonia of right upper lobe of lung  (HCC)  HIV (human immunodeficiency virus infection) (HCC)  Hypoxia  Wheezing    New Prescriptions New Prescriptions   No medications on file   I personally performed the services described in this documentation, which was scribed in my presence. The recorded information has been reviewed and is accurate.    Dahlia Client Laramie Meissner, PA-C 04/16/16 0455    Dione Booze, MD 04/16/16 [id number]

## 2016-04-16 NOTE — ED Triage Notes (Signed)
Pt reports having wheezing and cough for the last 7 weeks and was seen by PCP and pt reports no improvement with treatment. Pt reports that she was off of HIV medication when symptoms occurred but pt is back on medication at this time.

## 2016-04-16 NOTE — Progress Notes (Signed)
Pharmacy Antibiotic Note  Lindsay French is a 35 y.o. female admitted on 04/16/2016 with Pneumocystis Pneumonia.  Pharmacy has been consulted for Bactrim IV dosing.  Plan: Bactrim IV 460mg  iv q8hr  (~20mg /kg/day ABW)  Height: 5\' 2"  (157.5 cm) Weight: 215 lb (97.5 kg) IBW/kg (Calculated) : 50.1  Temp (24hrs), Avg:98.9 F (37.2 C), Min:98.8 F (37.1 C), Max:99 F (37.2 C)   Recent Labs Lab 04/16/16 0229  WBC 11.2*  CREATININE 0.82    Estimated Creatinine Clearance: 105.5 mL/min (by C-G formula based on SCr of 0.82 mg/dL).    Allergies  Allergen Reactions  . Hydrocodone Itching    hot  . Latex Other (See Comments)    As a teenager when using latex condoms she would get a yeast infection.     Antimicrobials this admission: Bactrim 12/25 >>  Dose adjustments this admission: -  Microbiology results: pending  Thank you for allowing pharmacy to be a part of this patient's care.  Aleene DavidsonGrimsley Jr, Aqua Denslow Crowford 04/16/2016 6:15 AM

## 2016-04-16 NOTE — ED Notes (Signed)
Attempted to call report.  They will call back in a few minutes. 

## 2016-04-17 DIAGNOSIS — J181 Lobar pneumonia, unspecified organism: Secondary | ICD-10-CM

## 2016-04-17 DIAGNOSIS — B20 Human immunodeficiency virus [HIV] disease: Secondary | ICD-10-CM

## 2016-04-17 DIAGNOSIS — J189 Pneumonia, unspecified organism: Secondary | ICD-10-CM

## 2016-04-17 HISTORY — DX: Pneumonia, unspecified organism: J18.9

## 2016-04-17 LAB — INFLUENZA PANEL BY PCR (TYPE A & B)
Influenza A By PCR: NEGATIVE
Influenza B By PCR: NEGATIVE

## 2016-04-17 LAB — BASIC METABOLIC PANEL
Anion gap: 9 (ref 5–15)
BUN: 8 mg/dL (ref 6–20)
CO2: 26 mmol/L (ref 22–32)
Calcium: 9.3 mg/dL (ref 8.9–10.3)
Chloride: 102 mmol/L (ref 101–111)
Creatinine, Ser: 0.56 mg/dL (ref 0.44–1.00)
GFR calc Af Amer: >60 mL/min (ref >60)
GFR calc non Af Amer: >60 mL/min (ref >60)
Glucose, Bld: 122 mg/dL — ABNORMAL HIGH (ref 65–99)
Potassium: 4.6 mmol/L (ref 3.5–5.1)
Sodium: 137 mmol/L (ref 135–145)

## 2016-04-17 LAB — STREP PNEUMONIAE URINARY ANTIGEN: Strep Pneumo Urinary Antigen: NEGATIVE

## 2016-04-17 LAB — CBC
HCT: 41.3 % (ref 36.0–46.0)
Hemoglobin: 13.5 g/dL (ref 12.0–15.0)
MCH: 32.3 pg (ref 26.0–34.0)
MCHC: 32.7 g/dL (ref 30.0–36.0)
MCV: 98.8 fL (ref 78.0–100.0)
Platelets: 240 10*3/uL (ref 150–400)
RBC: 4.18 MIL/uL (ref 3.87–5.11)
RDW: 15.1 % (ref 11.5–15.5)
WBC: 15.9 10*3/uL — ABNORMAL HIGH (ref 4.0–10.5)

## 2016-04-17 LAB — HIV 1/2 AB DIFFERENTIATION
HIV 1 Ab: POSITIVE — AB
HIV 2 Ab: NEGATIVE

## 2016-04-17 LAB — HIV-1 RNA ULTRAQUANT REFLEX TO GENTYP+
HIV-1 RNA BY PCR: 420 copies/mL
HIV-1 RNA Quant, Log: 2.623 log10copy/mL

## 2016-04-17 LAB — HIV ANTIBODY (ROUTINE TESTING W REFLEX)

## 2016-04-17 LAB — T-HELPER CELLS (CD4) COUNT (NOT AT ARMC)
CD4 % Helper T Cell: 38 % (ref 33–55)
CD4 T Cell Abs: 510 /uL (ref 400–2700)

## 2016-04-17 MED ORDER — LEVOFLOXACIN 750 MG PO TABS: 750.0000 mg | ORAL_TABLET | Freq: Every day | ORAL | 0 refills | Status: AC

## 2016-04-17 NOTE — Discharge Summary (Signed)
Physician Discharge Summary  Lindsay French ZOX:[id number] DOB: 19-May-1980 DOA: 04/16/2016  PCP: Lindsay Patella, MD  Admit date: 04/16/2016 Discharge date: 04/17/2016  Admitted From:Home Disposition:Home  Recommendations for Outpatient Follow-up:  1. Follow up with PCP in 1-2 weeks 2. Please obtain BMP/CBC in one week 3. Please follow up pending lab results. Infectious disease. 4. Please repeat chest x-ray in 3-4 weeks to make sure resolution of abnormal x-ray finding.  Home Health: No Equipment/Devices: No Discharge Condition: Stable CODE STATUS: Full code Diet recommendation: Heart healthy  Brief/Interim Summary:35 y.o.femalewith history of HIV presents to the ER because of shortness of breath and nonproductive cough. Patient has been having wheezing and shortness of breath since November 10, last month. Patient at that time had gone to urgent care and was prescribed inhalers and Z-Pak which patient did not take.Chest x-ray in the ER shows possible pneumonia. Lab work showed elevated LDH and patient was hypoxic. Patient is started on empiric antibiotics for pneumonia along with nebulizer treatment for bronchitis.    # Acute hypoxic respiratory failure likely in the setting of pneumonia: -Chest x-ray consistent with right upper lobe  opacity concerning for pneumonia. Patient with elevated LDH, concerning for possible pneumocystis infection on admission. Patient was initially treated with IV Bactrim and IV Levaquin. Evaluated by Dr. Ninetta Lights from infectious disease. The repeat CD4 count 510 today. Patient clinically improved significantly. Her oxygen saturation acceptable in room air. Denied chest pain shortness of breath, cough or fever or chills. Very less likely PCP pneumonia. I discussed with Dr. Ninetta Lights today who recommended to discharge patient with oral Levaquin. Pending lab results will be followed up as outpatient. I discussed with the patient she is very eager to go home  today. She understand outpatient follow-up with PCP and infectious disease. Also verbalized understanding of follow-up on pending lab results.  #Possible right upper lobe pneumonia: On antibiotics. Significantly improved.  #Abdominal pain: Patient reported left-sided abdominal pain especially while coughing. Most likely the pain is musculoskeletal. Abdominal exam benign. Patient has no distention or tenderness. Lipase is not elevated. Ultrasound of abdomen showed fatty liver. Patient reported that she knows about it and will follow-up outpatient.  #History of HIV on antiviral medication: Continue home antiviral medications.  infectious disease consult appreciated. Advised outpatient follow-up. #Essential hypertension: Blood pressure acceptable. Continue losartan.  #Anxiety depression: Continue Effexor. Mood is stable today.  Today, patient improved significantly. Walking in the room. Denied chest pain, shortness of breath, fever or chills. Oxygen saturation acceptable in room air. She is requesting to go home today. I discussed with Dr. Ninetta Lights and he agreed with the plan. Patient is medically stable to go home today with outpatient follow-up.  Discharge Diagnoses:  Principal Problem:   Acute respiratory failure with hypoxia (HCC) Active Problems:   Community acquired pneumonia of right upper lobe of lung (HCC)   Acute bronchitis   Hypertension    Discharge Instructions  Discharge Instructions    Call MD for:  difficulty breathing, headache or visual disturbances    Complete by:  As directed    Call MD for:  extreme fatigue    Complete by:  As directed    Call MD for:  hives    Complete by:  As directed    Call MD for:  persistant dizziness or light-headedness    Complete by:  As directed    Call MD for:  persistant nausea and vomiting    Complete by:  As directed    Call MD  for:  severe uncontrolled pain    Complete by:  As directed    Call MD for:  temperature >100.4     Complete by:  As directed    Diet - low sodium heart healthy    Complete by:  As directed    Discharge instructions    Complete by:  As directed    Please follow up with your PCP and infectious disease. Please follow up the pending lab result with Dr. Ninetta LightsHatcher from infectious disease. Please take probiotics or yogurt while on antibiotics. Please repeat chest x-ray in 3-4 weeks to make sure resolution of abnormal x-ray finding.   Increase activity slowly    Complete by:  As directed      Allergies as of 04/17/2016      Reactions   Hydrocodone Itching   hot   Latex Other (See Comments)   As a teenager when using latex condoms she would get a yeast infection.       Medication List    TAKE these medications   ALPRAZolam 0.5 MG tablet Commonly known as:  XANAX Take 1 tablet by mouth 2 (two) times daily as needed for anxiety.   diphenhydramine-acetaminophen 25-500 MG Tabs tablet Commonly known as:  TYLENOL PM Take 2 tablets by mouth at bedtime as needed (sleep).   emtricitabine-rilpivir-tenofovir AF 200-25-25 MG Tabs tablet Commonly known as:  ODEFSEY Take 1 tablet by mouth daily with breakfast.   fexofenadine-pseudoephedrine 60-120 MG 12 hr tablet Commonly known as:  ALLEGRA-D Take 1 tablet by mouth daily as needed (allergies).   folic acid 1 MG tablet Commonly known as:  FOLVITE Take 1 mg by mouth daily.   HYDROMET 5-1.5 MG/5ML syrup Generic drug:  HYDROcodone-homatropine Take 5 mLs by mouth every 6 (six) hours as needed for cough.   levofloxacin 750 MG tablet Commonly known as:  LEVAQUIN Take 1 tablet (750 mg total) by mouth daily.   losartan 100 MG tablet Commonly known as:  COZAAR Take 1 tablet by mouth daily.   naproxen sodium 220 MG tablet Commonly known as:  ANAPROX Take 660 mg by mouth 2 (two) times daily as needed (pain).   venlafaxine XR 75 MG 24 hr capsule Commonly known as:  EFFEXOR-XR Take 3 capsules by mouth daily.      Follow-up Information     Lindsay PatellaEADE,ROBERT ALEXANDER, MD. Schedule an appointment as soon as possible for a visit in 1 week(s).   Specialty:  Family Medicine Contact information: [phone] [address] [place] [address] [phone]        Staci RighterOMER, ROBERT, MD .   Specialty:  Infectious Diseases Contact information: [address] [place] [address] [phone]        Johny SaxJeffrey Hatcher, MD. Schedule an appointment as soon as possible for a visit in 1 week(s).   Specialty:  Infectious Diseases Why:  follow up pending lab results. Contact information: [address] [place] [address] [phone]          Allergies  Allergen Reactions  . Hydrocodone Itching    hot  . Latex Other (See Comments)    As a teenager when using latex condoms she would get a yeast infection.     Consultations: Infectious disease  Procedures/Studies: None  Subjective: Patient was seen and examined at bedside. Patient reported feeling significantly better and asking to go home today. Denied fever, chills, headache, dizziness, nausea, vomiting, chest pain. Abdomen pain is much better. Husband  at bedside.   Discharge Exam: Vitals:   04/16/16 2007 04/17/16 0519  BP: 125/64 138/90  Pulse: (!) 117 91  Resp: (!) 22 18  Temp: 98.7 F (37.1 C) 97.8 F (36.6 C)   Vitals:   04/16/16 2007 04/17/16 0519 04/17/16 0901 04/17/16 1241  BP: 125/64 138/90    Pulse: (!) 117 91    Resp: (!) 22 18    Temp: 98.7 F (37.1 C) 97.8 F (36.6 C)    TempSrc: Oral Oral    SpO2: 99% 96% 97% 98%  Weight:      Height:        General: Pt is alert, awake, not in acute distress Cardiovascular: RRR, S1/S2 +, no rubs, no gallops Respiratory: CTA bilaterally, no wheezing, no rhonchi Abdominal: Soft, NT, ND, bowel sounds + Extremities: no edema, no cyanosis Alert awake, oriented.   The results of significant diagnostics from this hospitalization (including imaging, microbiology, ancillary and  laboratory) are listed below for reference.     Microbiology: No results found for this or any previous visit (from the past 240 hour(s)).   Labs: BNP (last 3 results) No results for input(s): BNP in the last 8760 hours. Basic Metabolic Panel:  Recent Labs Lab 04/16/16 0229 04/16/16 0635 04/17/16 0536  NA 140  --  137  K 3.3*  --  4.6  CL 101  --  102  CO2 30  --  26  GLUCOSE 148*  --  122*  BUN 6  --  8  CREATININE 0.82 0.70 0.56  CALCIUM 8.6*  --  9.3   Liver Function Tests:  Recent Labs Lab 04/16/16 0439  AST 109*  ALT 139*  ALKPHOS 94  BILITOT 0.5  PROT 7.0  ALBUMIN 3.7    Recent Labs Lab 04/16/16 0228  LIPASE 24   No results for input(s): AMMONIA in the last 168 hours. CBC:  Recent Labs Lab 04/16/16 0229 04/16/16 0635 04/17/16 0536  WBC 11.2* 10.2 15.9*  NEUTROABS 5.6  --   --   HGB 13.1 12.4 13.5  HCT 40.8 38.3 41.3  MCV 99.8 98.7 98.8  PLT 214 208 240   Cardiac Enzymes: No results for input(s): CKTOTAL, CKMB, CKMBINDEX, TROPONINI in the last 168 hours. BNP: Invalid input(s): POCBNP CBG: No results for input(s): GLUCAP in the last 168 hours. D-Dimer No results for input(s): DDIMER in the last 72 hours. Hgb A1c No results for input(s): HGBA1C in the last 72 hours. Lipid Profile No results for input(s): CHOL, HDL, LDLCALC, TRIG, CHOLHDL, LDLDIRECT in the last 72 hours. Thyroid function studies No results for input(s): TSH, T4TOTAL, T3FREE, THYROIDAB in the last 72 hours.  Invalid input(s): FREET3 Anemia work up No results for input(s): VITAMINB12, FOLATE, FERRITIN, TIBC, IRON, RETICCTPCT in the last 72 hours. Urinalysis No results found for: COLORURINE, APPEARANCEUR, LABSPEC, PHURINE, GLUCOSEU, HGBUR, BILIRUBINUR, KETONESUR, PROTEINUR, UROBILINOGEN, NITRITE, LEUKOCYTESUR Sepsis Labs Invalid input(s): PROCALCITONIN,  WBC,  LACTICIDVEN Microbiology No results found for this or any previous visit (from the past 240 hour(s)).   Time  coordinating discharge: Over 30 minutes  SIGNED:   Maxie Barbron Prasad Bhandari, MD  [facility] 04/17/2016, 12:53 PM  If 7PM-7AM, please contact night-coverage [email or web address] Password [email or web address]

## 2016-04-17 NOTE — Progress Notes (Signed)
Pt discharged from the unit via RN. Pt discharge instructions were given to the pt. No questions or concerns at this time.  Kristi Norment W Shakeya Kerkman, RN

## 2016-04-18 LAB — LEGIONELLA PNEUMOPHILA SEROGP 1 UR AG: L. pneumophila Serogp 1 Ur Ag: NEGATIVE

## 2017-03-07 ENCOUNTER — Ambulatory Visit: Payer: 59

## 2017-03-07 ENCOUNTER — Ambulatory Visit (INDEPENDENT_AMBULATORY_CARE_PROVIDER_SITE_OTHER): Payer: 59 | Admitting: Pharmacist Clinician (PhC)/ Clinical Pharmacy Specialist

## 2017-03-07 DIAGNOSIS — B20 Human immunodeficiency virus [HIV] disease: Secondary | ICD-10-CM | POA: Diagnosis not present

## 2017-03-07 MED ORDER — EMTRICITAB-RILPIVIR-TENOFOV AF 200-25-25 MG PO TABS: 1.0000 | ORAL_TABLET | Freq: Every day | ORAL | 3 refills | Status: DC

## 2017-03-07 NOTE — Patient Instructions (Signed)
Come back and see Dr. Ninetta LightsHatcher on 12/19 at 4:15

## 2017-03-07 NOTE — Progress Notes (Signed)
HPI: Lindsay French is a 36 y.o. female   Allergies: Allergies  Allergen Reactions  . Hydrocodone Itching    hot  . Latex Other (See Comments)    As a teenager when using latex condoms she would get a yeast infection.     Vitals:    Past Medical History: Past Medical History:  Diagnosis Date  . Anxiety   . HIV (human immunodeficiency virus infection) (HCC)     Social History: Social History   Socioeconomic History  . Marital status: Married    Spouse name: Not on file  . Number of children: Not on file  . Years of education: Not on file  . Highest education level: Not on file  Social Needs  . Financial resource strain: Not on file  . Food insecurity - worry: Not on file  . Food insecurity - inability: Not on file  . Transportation needs - medical: Not on file  . Transportation needs - non-medical: Not on file  Occupational History  . Not on file  Tobacco Use  . Smoking status: Former Smoker    Packs/day: 0.30    Years: 15.00    Pack years: 4.50    Types: Cigarettes  . Smokeless tobacco: Never Used  Substance and Sexual Activity  . Alcohol use: Yes    Alcohol/week: 4.2 oz    Types: 7 Glasses of wine per week    Comment: wine  2-3 times a week  . Drug use: No    Comment: Previous hx of crack use - clean for 7 years   . Sexual activity: Yes    Partners: Male    Birth control/protection: None    Comment: try to conceive   Other Topics Concern  . Not on file  Social History Narrative   ** Merged History Encounter **        Previous Regimen: DRV/r/TRV, Complera  Current Regimen: Odefsey  Labs: HIV 1 RNA Quant (copies/mL)  Date Value  03/21/2016 3,762 (H)  06/15/2015 <20  05/31/2014 <20   CD4 T Cell Abs (/uL)  Date Value  04/16/2016 510  03/21/2016 790  06/15/2015 990   Hep B S Ab (no units)  Date Value  11/14/2010 NEG   Hepatitis B Surface Ag (no units)  Date Value  11/14/2010 NEGATIVE   HCV Ab (no units)  Date Value  11/14/2010  NEGATIVE    CrCl: CrCl cannot be calculated (Patient's most recent lab result is older than the maximum 21 days allowed.).  Lipids:    Component Value Date/Time   CHOL 246 (H) 03/21/2016 1703   TRIG 167 (H) 03/21/2016 1703   HDL 51 03/21/2016 1703   CHOLHDL 4.8 03/21/2016 1703   VLDL 33 (H) 03/21/2016 1703   LDLCALC 162 (H) 03/21/2016 1703    Assessment: Lindsay French has not been seen since Nov 2017. She came in to see pharmacy today so we can do labs. She has a blip last year because she was off of therapy for a month. She came in really late so I didn't have a lot of time to go over everything. She stated that she has been doing well on Odefsey and has not missed any doses. She was scheduled to f/u with Dr. Luciana Axeomer at the end of this month but she asked if she could see Dr. Ninetta LightsHatcher instead. Changed the appt to Dec.   She has been seeing an OB for her pregnancy now. She stated that she is confirmed IUP now. It's  now even more important to stay suppressed. She preferred to stay on Odefsey.   Recommendations:  Continue Odefsey 1 PO qday with meals All labs today F/u with Dr. Ninetta LightsHatcher in Dec  Minh Pham, PharmD, BCPS, AAHIVP, CPP Clinical Infectious Disease Pharmacist [facility] 03/07/2017, 4:32 PM

## 2017-03-08 LAB — COMPLETE METABOLIC PANEL WITH GFR
AG Ratio: 1.6 (calc) (ref 1.0–2.5)
ALT: 45 U/L — ABNORMAL HIGH (ref 6–29)
AST: 34 U/L — ABNORMAL HIGH (ref 10–30)
Albumin: 4.2 g/dL (ref 3.6–5.1)
Alkaline phosphatase (APISO): 87 U/L (ref 33–115)
BUN: 8 mg/dL (ref 7–25)
CO2: 27 mmol/L (ref 20–32)
Calcium: 9.4 mg/dL (ref 8.6–10.2)
Chloride: 101 mmol/L (ref 98–110)
Creat: 0.61 mg/dL (ref 0.50–1.10)
GFR, Est African American: 136 mL/min/{1.73_m2} (ref >=60)
GFR, Est Non African American: 117 mL/min/{1.73_m2} (ref >=60)
Globulin: 2.7 g/dL (calc) (ref 1.9–3.7)
Glucose, Bld: 95 mg/dL (ref 65–99)
Potassium: 4.4 mmol/L (ref 3.5–5.3)
Sodium: 138 mmol/L (ref 135–146)
Total Bilirubin: 0.4 mg/dL (ref 0.2–1.2)
Total Protein: 6.9 g/dL (ref 6.1–8.1)

## 2017-03-08 LAB — LIPID PANEL
Cholesterol: 226 mg/dL — ABNORMAL HIGH (ref <200)
HDL: 38 mg/dL — ABNORMAL LOW (ref >50)
LDL Cholesterol (Calc): 154 mg/dL (calc) — ABNORMAL HIGH
Non-HDL Cholesterol (Calc): 188 mg/dL (calc) — ABNORMAL HIGH (ref <130)
Total CHOL/HDL Ratio: 5.9 (calc) — ABNORMAL HIGH (ref <5.0)
Triglycerides: 196 mg/dL — ABNORMAL HIGH (ref <150)

## 2017-03-08 LAB — T-HELPER CELL (CD4) - (RCID CLINIC ONLY)
CD4 % Helper T Cell: 38 % (ref 33–55)
CD4 T Cell Abs: 900 /uL (ref 400–2700)

## 2017-03-08 LAB — RPR: RPR Ser Ql: NONREACTIVE

## 2017-03-08 LAB — HIV RNA, RTPCR W/R GT (RTI, PI,INT)

## 2017-03-12 LAB — HIV RNA, RTPCR W/R GT (RTI, PI,INT)
HIV 1 RNA Quant: <20 copies/mL
HIV-1 RNA Quant, Log: <1.3 Log copies/mL

## 2017-03-21 ENCOUNTER — Ambulatory Visit: Payer: 59 | Admitting: Internal Medicine

## 2017-04-10 ENCOUNTER — Encounter: Payer: Self-pay | Admitting: Infectious Diseases

## 2017-04-10 ENCOUNTER — Ambulatory Visit: Payer: 59 | Admitting: Infectious Diseases

## 2017-04-10 VITALS — BP 123/79 | HR 99 | Temp 99.0°F | Wt 236.0 lb

## 2017-04-10 DIAGNOSIS — I10 Essential (primary) hypertension: Secondary | ICD-10-CM

## 2017-04-10 DIAGNOSIS — Z79899 Other long term (current) drug therapy: Secondary | ICD-10-CM | POA: Diagnosis not present

## 2017-04-10 DIAGNOSIS — E669 Obesity, unspecified: Secondary | ICD-10-CM

## 2017-04-10 DIAGNOSIS — B20 Human immunodeficiency virus [HIV] disease: Secondary | ICD-10-CM

## 2017-04-10 DIAGNOSIS — F909 Attention-deficit hyperactivity disorder, unspecified type: Secondary | ICD-10-CM

## 2017-04-10 DIAGNOSIS — E7800 Pure hypercholesterolemia, unspecified: Secondary | ICD-10-CM

## 2017-04-10 DIAGNOSIS — Z113 Encounter for screening for infections with a predominantly sexual mode of transmission: Secondary | ICD-10-CM

## 2017-04-10 DIAGNOSIS — E785 Hyperlipidemia, unspecified: Secondary | ICD-10-CM | POA: Insufficient documentation

## 2017-04-10 DIAGNOSIS — Z6839 Body mass index (BMI) 39.0-39.9, adult: Secondary | ICD-10-CM | POA: Diagnosis not present

## 2017-04-10 MED ORDER — EMTRICITAB-RILPIVIR-TENOFOV AF 200-25-25 MG PO TABS
1.0000 | ORAL_TABLET | Freq: Every day | ORAL | 4 refills | Status: DC
Start: 2017-04-10 — End: 2017-07-01

## 2017-04-10 NOTE — Assessment & Plan Note (Signed)
Will continue to encourage diet and exercise as she is able.

## 2017-04-10 NOTE — Progress Notes (Signed)
   Subjective:    Patient ID: Lindsay French, female    DOB: 30-Mar-1981, 36 y.o.   MRN: [id number]  HPI 36 yo F HIV+ previously on prezcobix-truvada then complera/odefsy.  She has not been seen since 02-2016.   She has continued on odefsy. She had second IUI last week for conception.  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  Has 3 nanny sons- 05/27/33 yo No problems with ART.   HIV 1 RNA Quant  Date Value  03/07/2017 <20 DETECTED copies/mL  03/07/2017 CANCELED  03/21/2016 3,762 copies/mL (H)   CD4 T Cell Abs (/uL)  Date Value  03/07/2017 900  04/16/2016 510  03/21/2016 790     Review of Systems  Constitutional: Negative for appetite change and unexpected weight change.  Gastrointestinal: Negative for constipation and diarrhea.  Genitourinary: Negative for difficulty urinating.  Please see HPI. All other systems reviewed and negative.     Objective:   Physical Exam  Constitutional: She appears well-developed and well-nourished.  Eyes: EOM are normal. Pupils are equal, round, and reactive to light.  Neck: Neck supple.  Cardiovascular: Normal rate, regular rhythm and normal heart sounds.  Pulmonary/Chest: Effort normal and breath sounds normal.  Abdominal: Soft. Bowel sounds are normal. There is no tenderness. There is no rebound.  Musculoskeletal: She exhibits no edema.  Lymphadenopathy:    She has no cervical adenopathy.  Skin: Skin is warm and dry. No rash noted.  Psychiatric: She has a normal mood and affect.      Assessment & Plan:

## 2017-04-10 NOTE — Assessment & Plan Note (Signed)
As she is < 35 yo, the ASCVD calculator does not work for her.  By substituting age 36 for her, her risk is still < 5%.   Continue to follow, encourage wt loss, diet, exercise.

## 2017-04-10 NOTE — Assessment & Plan Note (Signed)
She will discuss use of effexor, xanax with her OB-GYN.

## 2017-04-10 NOTE — Assessment & Plan Note (Addendum)
Is on losarten, OB-GYN is aware. We discussed that this is a category D.

## 2017-04-10 NOTE — Assessment & Plan Note (Signed)
She is doing well Offered/refused condoms.  meds refilled.  She refuses flu shot.  Will see her back in 1 year.

## 2017-06-26 ENCOUNTER — Ambulatory Visit: Payer: Self-pay

## 2017-06-27 ENCOUNTER — Encounter: Payer: Self-pay | Admitting: Infectious Diseases

## 2017-07-01 ENCOUNTER — Other Ambulatory Visit: Payer: Self-pay | Admitting: Pharmacist

## 2017-07-01 DIAGNOSIS — B20 Human immunodeficiency virus [HIV] disease: Secondary | ICD-10-CM

## 2017-07-01 MED ORDER — EMTRICITAB-RILPIVIR-TENOFOV AF 200-25-25 MG PO TABS: 1.0000 | ORAL_TABLET | Freq: Every day | ORAL | 11 refills | Status: DC

## 2017-07-10 ENCOUNTER — Other Ambulatory Visit: Payer: Self-pay | Admitting: *Deleted

## 2017-07-10 DIAGNOSIS — B20 Human immunodeficiency virus [HIV] disease: Secondary | ICD-10-CM

## 2017-07-10 MED ORDER — EMTRICITAB-RILPIVIR-TENOFOV AF 200-25-25 MG PO TABS
1.0000 | ORAL_TABLET | Freq: Every day | ORAL | 2 refills | Status: DC
Start: 2017-07-10 — End: 2018-04-24

## 2017-09-30 DIAGNOSIS — E282 Polycystic ovarian syndrome: Secondary | ICD-10-CM | POA: Diagnosis not present

## 2017-09-30 DIAGNOSIS — N8500 Endometrial hyperplasia, unspecified: Secondary | ICD-10-CM | POA: Diagnosis not present

## 2017-09-30 DIAGNOSIS — N840 Polyp of corpus uteri: Secondary | ICD-10-CM | POA: Diagnosis not present

## 2017-09-30 DIAGNOSIS — Z319 Encounter for procreative management, unspecified: Secondary | ICD-10-CM | POA: Diagnosis not present

## 2017-09-30 DIAGNOSIS — Z3141 Encounter for fertility testing: Secondary | ICD-10-CM | POA: Diagnosis not present

## 2017-10-29 ENCOUNTER — Ambulatory Visit: Payer: No Typology Code available for payment source

## 2017-11-05 DIAGNOSIS — N390 Urinary tract infection, site not specified: Secondary | ICD-10-CM | POA: Diagnosis not present

## 2017-11-21 DIAGNOSIS — S62109A Fracture of unspecified carpal bone, unspecified wrist, initial encounter for closed fracture: Secondary | ICD-10-CM

## 2017-11-21 HISTORY — DX: Fracture of unspecified carpal bone, unspecified wrist, initial encounter for closed fracture: S62.109A

## 2017-12-02 DIAGNOSIS — F324 Major depressive disorder, single episode, in partial remission: Secondary | ICD-10-CM | POA: Diagnosis not present

## 2017-12-02 DIAGNOSIS — I10 Essential (primary) hypertension: Secondary | ICD-10-CM | POA: Diagnosis not present

## 2017-12-02 DIAGNOSIS — F419 Anxiety disorder, unspecified: Secondary | ICD-10-CM | POA: Diagnosis not present

## 2017-12-03 DIAGNOSIS — Z6841 Body Mass Index (BMI) 40.0 and over, adult: Secondary | ICD-10-CM | POA: Diagnosis not present

## 2017-12-03 DIAGNOSIS — M25532 Pain in left wrist: Secondary | ICD-10-CM | POA: Diagnosis not present

## 2017-12-03 DIAGNOSIS — S52592D Other fractures of lower end of left radius, subsequent encounter for closed fracture with routine healing: Secondary | ICD-10-CM | POA: Diagnosis not present

## 2017-12-03 DIAGNOSIS — S52502A Unspecified fracture of the lower end of left radius, initial encounter for closed fracture: Secondary | ICD-10-CM | POA: Diagnosis not present

## 2017-12-04 DIAGNOSIS — M25532 Pain in left wrist: Secondary | ICD-10-CM | POA: Diagnosis not present

## 2017-12-04 DIAGNOSIS — S52502A Unspecified fracture of the lower end of left radius, initial encounter for closed fracture: Secondary | ICD-10-CM | POA: Diagnosis not present

## 2017-12-11 DIAGNOSIS — M25532 Pain in left wrist: Secondary | ICD-10-CM | POA: Diagnosis not present

## 2017-12-11 DIAGNOSIS — S52532D Colles' fracture of left radius, subsequent encounter for closed fracture with routine healing: Secondary | ICD-10-CM | POA: Diagnosis not present

## 2017-12-19 ENCOUNTER — Ambulatory Visit: Payer: Self-pay

## 2017-12-19 DIAGNOSIS — S52532D Colles' fracture of left radius, subsequent encounter for closed fracture with routine healing: Secondary | ICD-10-CM | POA: Diagnosis not present

## 2017-12-20 ENCOUNTER — Encounter: Payer: Self-pay | Admitting: Infectious Diseases

## 2017-12-25 ENCOUNTER — Encounter (HOSPITAL_BASED_OUTPATIENT_CLINIC_OR_DEPARTMENT_OTHER): Payer: Self-pay

## 2017-12-26 ENCOUNTER — Encounter (HOSPITAL_BASED_OUTPATIENT_CLINIC_OR_DEPARTMENT_OTHER): Payer: Self-pay

## 2017-12-26 ENCOUNTER — Other Ambulatory Visit: Payer: Self-pay

## 2017-12-26 NOTE — Progress Notes (Signed)
Spoke with:  Cicero Duck NPO:  No food after midnight/Clear liquids until 8:00AM DOS Arrival time: 12 noon Labs: Istat 4, EKG AM medications: Levothyroxine, Xanax if needed Pre op orders: No, Raynelle Fanning is aware Ride home:  Brynda Greathouse (husband) [phone]

## 2017-12-30 DIAGNOSIS — M25532 Pain in left wrist: Secondary | ICD-10-CM | POA: Diagnosis not present

## 2017-12-30 DIAGNOSIS — Z4789 Encounter for other orthopedic aftercare: Secondary | ICD-10-CM | POA: Diagnosis not present

## 2017-12-30 DIAGNOSIS — S52532D Colles' fracture of left radius, subsequent encounter for closed fracture with routine healing: Secondary | ICD-10-CM | POA: Diagnosis not present

## 2017-12-31 ENCOUNTER — Other Ambulatory Visit: Payer: Self-pay

## 2017-12-31 ENCOUNTER — Encounter (HOSPITAL_BASED_OUTPATIENT_CLINIC_OR_DEPARTMENT_OTHER)
Admission: RE | Disposition: A | Discharge status: Home / Self Care | Payer: Self-pay | Source: Ambulatory Visit | Attending: Obstetrics and Gynecology

## 2017-12-31 ENCOUNTER — Ambulatory Visit (HOSPITAL_BASED_OUTPATIENT_CLINIC_OR_DEPARTMENT_OTHER): Payer: BLUE CROSS/BLUE SHIELD | Admitting: Certified Registered"

## 2017-12-31 ENCOUNTER — Encounter (HOSPITAL_BASED_OUTPATIENT_CLINIC_OR_DEPARTMENT_OTHER): Payer: Self-pay | Admitting: *Deleted

## 2017-12-31 ENCOUNTER — Ambulatory Visit (HOSPITAL_BASED_OUTPATIENT_CLINIC_OR_DEPARTMENT_OTHER)
Admission: RE | Admit: 2017-12-31 | Discharge: 2017-12-31 | Disposition: A | Discharge status: Home / Self Care | Payer: BLUE CROSS/BLUE SHIELD | Source: Ambulatory Visit | Attending: Obstetrics and Gynecology | Admitting: Obstetrics and Gynecology

## 2017-12-31 DIAGNOSIS — Z79890 Hormone replacement therapy (postmenopausal): Secondary | ICD-10-CM | POA: Diagnosis not present

## 2017-12-31 DIAGNOSIS — Z7984 Long term (current) use of oral hypoglycemic drugs: Secondary | ICD-10-CM | POA: Insufficient documentation

## 2017-12-31 DIAGNOSIS — Z87891 Personal history of nicotine dependence: Secondary | ICD-10-CM | POA: Insufficient documentation

## 2017-12-31 DIAGNOSIS — F419 Anxiety disorder, unspecified: Secondary | ICD-10-CM | POA: Insufficient documentation

## 2017-12-31 DIAGNOSIS — Z79899 Other long term (current) drug therapy: Secondary | ICD-10-CM | POA: Diagnosis not present

## 2017-12-31 DIAGNOSIS — N979 Female infertility, unspecified: Secondary | ICD-10-CM | POA: Diagnosis not present

## 2017-12-31 DIAGNOSIS — Z791 Long term (current) use of non-steroidal anti-inflammatories (NSAID): Secondary | ICD-10-CM | POA: Insufficient documentation

## 2017-12-31 DIAGNOSIS — N840 Polyp of corpus uteri: Secondary | ICD-10-CM | POA: Insufficient documentation

## 2017-12-31 DIAGNOSIS — F329 Major depressive disorder, single episode, unspecified: Secondary | ICD-10-CM | POA: Insufficient documentation

## 2017-12-31 DIAGNOSIS — Z21 Asymptomatic human immunodeficiency virus [HIV] infection status: Secondary | ICD-10-CM | POA: Diagnosis not present

## 2017-12-31 DIAGNOSIS — I10 Essential (primary) hypertension: Secondary | ICD-10-CM | POA: Insufficient documentation

## 2017-12-31 DIAGNOSIS — E039 Hypothyroidism, unspecified: Secondary | ICD-10-CM | POA: Diagnosis not present

## 2017-12-31 DIAGNOSIS — Z6841 Body Mass Index (BMI) 40.0 and over, adult: Secondary | ICD-10-CM | POA: Diagnosis not present

## 2017-12-31 HISTORY — DX: Obesity, unspecified: E66.9

## 2017-12-31 HISTORY — DX: Polycystic ovarian syndrome: E28.2

## 2017-12-31 HISTORY — DX: Presence of spectacles and contact lenses: Z97.3

## 2017-12-31 HISTORY — DX: Major depressive disorder, single episode, unspecified: F32.9

## 2017-12-31 HISTORY — DX: Personal history of diseases of the blood and blood-forming organs and certain disorders involving the immune mechanism: Z86.2

## 2017-12-31 HISTORY — DX: Hyperlipidemia, unspecified: E78.5

## 2017-12-31 HISTORY — DX: Fracture of unspecified carpal bone, unspecified wrist, initial encounter for closed fracture: S62.109A

## 2017-12-31 HISTORY — DX: Abnormal results of liver function studies: R94.5

## 2017-12-31 HISTORY — DX: Attention-deficit hyperactivity disorder, unspecified type: F90.9

## 2017-12-31 HISTORY — DX: Fatty (change of) liver, not elsewhere classified: K76.0

## 2017-12-31 HISTORY — DX: Other seasonal allergic rhinitis: J30.2

## 2017-12-31 HISTORY — DX: Presence of dental prosthetic device (complete) (partial): Z97.2

## 2017-12-31 HISTORY — DX: Pneumonia, unspecified organism: J18.9

## 2017-12-31 HISTORY — DX: Essential (primary) hypertension: I10

## 2017-12-31 HISTORY — DX: Other specified abnormal findings of blood chemistry: R79.89

## 2017-12-31 HISTORY — DX: Personal history of other (healed) physical injury and trauma: Z87.828

## 2017-12-31 HISTORY — PX: HYSTEROSCOPY: SHX211

## 2017-12-31 HISTORY — DX: Gastro-esophageal reflux disease without esophagitis: K21.9

## 2017-12-31 HISTORY — DX: Depression, unspecified: F32.A

## 2017-12-31 HISTORY — DX: Hypothyroidism, unspecified: E03.9

## 2017-12-31 HISTORY — DX: Cardiac murmur, unspecified: R01.1

## 2017-12-31 HISTORY — DX: Unspecified asthma, uncomplicated: J45.909

## 2017-12-31 LAB — POCT I-STAT 4, (NA,K, GLUC, HGB,HCT)
Glucose, Bld: 111 mg/dL — ABNORMAL HIGH (ref 70–99)
HCT: 43 % (ref 36.0–46.0)
Hemoglobin: 14.6 g/dL (ref 12.0–15.0)
Potassium: 4 mmol/L (ref 3.5–5.1)
Sodium: 139 mmol/L (ref 135–145)

## 2017-12-31 LAB — TYPE AND SCREEN
ABO/RH(D): A POS
Antibody Screen: NEGATIVE

## 2017-12-31 LAB — POCT PREGNANCY, URINE: Preg Test, Ur: NEGATIVE

## 2017-12-31 SURGERY — HYSTEROSCOPY
Anesthesia: General | Site: Vagina

## 2017-12-31 MED ORDER — LACTATED RINGERS IV SOLN
INTRAVENOUS | Status: DC
  Administered 2017-12-31 (×2): via INTRAVENOUS
  Filled 2017-12-31: qty 1000

## 2017-12-31 MED ORDER — MIDAZOLAM HCL 5 MG/5ML IJ SOLN
INTRAMUSCULAR | Status: DC | PRN
  Administered 2017-12-31: 2 mg via INTRAVENOUS

## 2017-12-31 MED ORDER — OXYCODONE-ACETAMINOPHEN 5-325 MG PO TABS
ORAL_TABLET | ORAL | Status: AC
  Filled 2017-12-31: qty 1

## 2017-12-31 MED ORDER — ONDANSETRON HCL 4 MG/2ML IJ SOLN
4.0000 mg | Freq: Once | INTRAMUSCULAR | Status: DC | PRN
  Filled 2017-12-31: qty 2

## 2017-12-31 MED ORDER — CEFAZOLIN SODIUM-DEXTROSE 2-3 GM-%(50ML) IV SOLR
INTRAVENOUS | Status: DC | PRN
  Administered 2017-12-31: 2 g via INTRAVENOUS

## 2017-12-31 MED ORDER — OXYCODONE-ACETAMINOPHEN 5-325 MG PO TABS
1.0000 | ORAL_TABLET | Freq: Once | ORAL | Status: AC
  Administered 2017-12-31: 1 via ORAL
  Filled 2017-12-31: qty 1

## 2017-12-31 MED ORDER — LIDOCAINE 2% (20 MG/ML) 5 ML SYRINGE
INTRAMUSCULAR | Status: AC
  Filled 2017-12-31: qty 5

## 2017-12-31 MED ORDER — ONDANSETRON HCL 4 MG/2ML IJ SOLN
INTRAMUSCULAR | Status: DC | PRN
  Administered 2017-12-31: 4 mg via INTRAVENOUS

## 2017-12-31 MED ORDER — FENTANYL CITRATE (PF) 100 MCG/2ML IJ SOLN
INTRAMUSCULAR | Status: DC | PRN
  Administered 2017-12-31: 50 ug via INTRAVENOUS
  Administered 2017-12-31 (×2): 25 ug via INTRAVENOUS

## 2017-12-31 MED ORDER — MIDAZOLAM HCL 2 MG/2ML IJ SOLN
INTRAMUSCULAR | Status: AC
  Filled 2017-12-31: qty 2

## 2017-12-31 MED ORDER — PROPOFOL 10 MG/ML IV BOLUS
INTRAVENOUS | Status: DC | PRN
  Administered 2017-12-31: 200 mg via INTRAVENOUS

## 2017-12-31 MED ORDER — SODIUM CHLORIDE 0.9 % IR SOLN
Status: DC | PRN
  Administered 2017-12-31: 3000 mL

## 2017-12-31 MED ORDER — LIDOCAINE 2% (20 MG/ML) 5 ML SYRINGE
INTRAMUSCULAR | Status: DC | PRN
  Administered 2017-12-31: 60 mg via INTRAVENOUS

## 2017-12-31 MED ORDER — CEFAZOLIN SODIUM-DEXTROSE 2-4 GM/100ML-% IV SOLN
INTRAVENOUS | Status: AC
  Filled 2017-12-31: qty 100

## 2017-12-31 MED ORDER — FENTANYL CITRATE (PF) 100 MCG/2ML IJ SOLN
25.0000 ug | INTRAMUSCULAR | Status: DC | PRN
  Filled 2017-12-31: qty 1

## 2017-12-31 MED ORDER — FENTANYL CITRATE (PF) 100 MCG/2ML IJ SOLN
INTRAMUSCULAR | Status: AC
  Filled 2017-12-31: qty 2

## 2017-12-31 MED ORDER — KETOROLAC TROMETHAMINE 30 MG/ML IJ SOLN
INTRAMUSCULAR | Status: DC | PRN
  Administered 2017-12-31: 30 mg via INTRAVENOUS

## 2017-12-31 MED ORDER — DEXAMETHASONE SODIUM PHOSPHATE 4 MG/ML IJ SOLN
INTRAMUSCULAR | Status: DC | PRN
  Administered 2017-12-31: 10 mg via INTRAVENOUS

## 2017-12-31 SURGICAL SUPPLY — 29 items
BIPOLAR CUTTING LOOP 21FR (ELECTRODE)
CANISTER SUCT 3000ML PPV (MISCELLANEOUS) IMPLANT
CANNULA CURETTE W/SYR 6 (CANNULA) ×2 IMPLANT
CANNULA CURETTE W/SYR 7 (CANNULA) IMPLANT
CATH NOVY CORNUAL CURVED 5.0 (CATHETERS) IMPLANT
CATH ROBINSON RED A/P 16FR (CATHETERS) IMPLANT
CATH SILICONE 16FRX5CC (CATHETERS) ×2 IMPLANT
DILATOR CANAL MILEX (MISCELLANEOUS) IMPLANT
DRAPE C-ARM 42X120 X-RAY (DRAPES) IMPLANT
ELECT BIPOLAR KNIFE NDL PTD 7M (ELECTRODE) IMPLANT
GAUZE SPONGE 4X4 16PLY XRAY LF (GAUZE/BANDAGES/DRESSINGS) ×2 IMPLANT
GLOVE BIO SURGEON STRL SZ 6.5 (GLOVE) ×2 IMPLANT
GLOVE BIO SURGEON STRL SZ8 (GLOVE) ×2 IMPLANT
GLOVE BIOGEL PI IND STRL 7.0 (GLOVE) ×1 IMPLANT
GLOVE BIOGEL PI IND STRL 7.5 (GLOVE) ×3 IMPLANT
GLOVE BIOGEL PI INDICATOR 7.0 (GLOVE) ×1
GLOVE BIOGEL PI INDICATOR 7.5 (GLOVE) ×3
GOWN STRL REUS W/TWL XL LVL3 (GOWN DISPOSABLE) ×6 IMPLANT
KIT PROCEDURE FLUENT (KITS) ×2 IMPLANT
LOOP CUTTING BIPOLAR 21FR (ELECTRODE) IMPLANT
MANIFOLD NEPTUNE II (INSTRUMENTS) ×2 IMPLANT
PACK VAGINAL MINOR WOMEN LF (CUSTOM PROCEDURE TRAY) ×2 IMPLANT
PAD OB MATERNITY 4.3X12.25 (PERSONAL CARE ITEMS) ×2 IMPLANT
SEPRAFILM MEMBRANE 5X6 (MISCELLANEOUS) IMPLANT
SET IRRIG Y TYPE TUR BLADDER L (SET/KITS/TRAYS/PACK) ×2 IMPLANT
STENT BALLN UTERINE 3CM 6FR (STENTS) IMPLANT
STENT BALLN UTERINE 4CM 6FR (STENTS) IMPLANT
SYRINGE 3CC/18X1.5 ECLIPSE (MISCELLANEOUS) ×2 IMPLANT
TOWEL OR 17X24 6PK STRL BLUE (TOWEL DISPOSABLE) ×4 IMPLANT

## 2017-12-31 NOTE — Discharge Instructions (Signed)
  Post Anesthesia Home Care Instructions  Activity: Get plenty of rest for the remainder of the day. A responsible adult should stay with you for 24 hours following the procedure.  For the next 24 hours, DO NOT: -Drive a car -Operate machinery -Drink alcoholic beverages -Take any medication unless instructed by your physician -Make any legal decisions or sign important papers.  Meals: Start with liquid foods such as gelatin or soup. Progress to regular foods as tolerated. Avoid greasy, spicy, heavy foods. If nausea and/or vomiting occur, drink only clear liquids until the nausea and/or vomiting subsides. Call your physician if vomiting continues.  Special Instructions/Symptoms: Your throat may feel dry or sore from the anesthesia or the breathing tube placed in your throat during surgery. If this causes discomfort, gargle with warm salt water. The discomfort should disappear within 24 hours.  If you had a scopolamine patch placed behind your ear for the management of post- operative nausea and/or vomiting:  1. The medication in the patch is effective for 72 hours, after which it should be removed.  Wrap patch in a tissue and discard in the trash. Wash hands thoroughly with soap and water. 2. You may remove the patch earlier than 72 hours if you experience unpleasant side effects which may include dry mouth, dizziness or visual disturbances. 3. Avoid touching the patch. Wash your hands with soap and water after contact with the patch.     D & C Home care Instructions:   Personal hygiene:  Used sanitary napkins for vaginal drainage not tampons. Shower or tub bathe the day after your procedure. No douching until bleeding stops. Always wipe from front to back after  Elimination.  Activity: Do not drive or operate any equipment today. The effects of the anesthesia are still present and drowsiness may result. Rest today, not necessarily flat bed rest, just take it easy. You may resume your  normal activity in one to 2 days.  Sexual activity: No intercourse for one week or as indicated by your physician  Diet: Eat a light diet as desired this evening. You may resume a regular diet tomorrow.  Return to work: One to 2 days.  General Expectations of your surgery: Vaginal bleeding should be no heavier than a normal period. Spotting may continue up to 10 days. Mild cramps may continue for a couple of days. You may have a regular period in 2-6 weeks.  Unexpected observations call your doctor if these occur: persistent or heavy bleeding. Severe abdominal cramping or pain. Elevation of temperature greater than 100F.  Call for an appointment in one week.  

## 2017-12-31 NOTE — Anesthesia Preprocedure Evaluation (Addendum)
Anesthesia Evaluation  Patient identified by MRN, date of birth, ID band Patient awake    Reviewed: Allergy & Precautions, NPO status , Patient's Chart, lab work & pertinent test results  Airway Mallampati: II  TM Distance: >3 FB Neck ROM: Full    Dental  (+) Dental Advisory Given   Pulmonary asthma , former smoker,    breath sounds clear to auscultation       Cardiovascular hypertension,  Rhythm:Regular Rate:Normal     Neuro/Psych Anxiety Depression negative neurological ROS     GI/Hepatic Neg liver ROS, GERD  ,  Endo/Other  Hypothyroidism Morbid obesity  Renal/GU negative Renal ROS     Musculoskeletal   Abdominal   Peds  Hematology negative hematology ROS (+) HIV,   Anesthesia Other Findings   Reproductive/Obstetrics                            Anesthesia Physical Anesthesia Plan  ASA: III  Anesthesia Plan: General   Post-op Pain Management:    Induction: Intravenous  PONV Risk Score and Plan: 3 and Dexamethasone, Ondansetron, Treatment may vary due to age or medical condition and Midazolam  Airway Management Planned: LMA  Additional Equipment:   Intra-op Plan:   Post-operative Plan: Extubation in OR  Informed Consent: I have reviewed the patients History and Physical, chart, labs and discussed the procedure including the risks, benefits and alternatives for the proposed anesthesia with the patient or authorized representative who has indicated his/her understanding and acceptance.   Dental advisory given  Plan Discussed with: CRNA  Anesthesia Plan Comments:         Anesthesia Quick Evaluation

## 2017-12-31 NOTE — Anesthesia Procedure Notes (Signed)
Procedure Name: LMA Insertion Date/Time: 12/31/2017 2:57 PM Performed by: Francie Massing, CRNA Pre-anesthesia Checklist: Patient identified, Emergency Drugs available, Suction available and Patient being monitored Patient Re-evaluated:Patient Re-evaluated prior to induction Oxygen Delivery Method: Circle system utilized Preoxygenation: Pre-oxygenation with 100% oxygen Induction Type: IV induction Ventilation: Mask ventilation without difficulty LMA: LMA inserted LMA Size: 4.0 Number of attempts: 1 Airway Equipment and Method: Bite block Placement Confirmation: positive ETCO2 Tube secured with: Tape Dental Injury: Teeth and Oropharynx as per pre-operative assessment

## 2017-12-31 NOTE — Transfer of Care (Signed)
Immediate Anesthesia Transfer of Care Note  Patient: Shaakira Mohrman  Procedure(s) Performed: Procedure(s) (LRB): HYSTEROSCOPY and polypectomy (N/A)  Patient Location: PACU  Anesthesia Type: General  Level of Consciousness: awake, oriented, sedated and patient cooperative  Airway & Oxygen Therapy: Patient Spontanous Breathing and Patient connected to face mask oxygen  Post-op Assessment: Report given to PACU RN and Post -op Vital signs reviewed and stable  Post vital signs: Reviewed and stable  Complications: No apparent anesthesia complications  Last Vitals:  Vitals Value Taken Time  BP    Temp    Pulse 84 12/31/2017  3:36 PM  Resp 17 12/31/2017  3:36 PM  SpO2 97 % 12/31/2017  3:36 PM  Vitals shown include unvalidated device data.  Last Pain:  Vitals:   12/31/17 1212  TempSrc: Oral

## 2017-12-31 NOTE — Op Note (Signed)
OPERATIVE NOTE  Preoperative diagnosis: Endometrial polyp  Postoperative diagnosis: The same  Procedure: Hysteroscopy, polypectomy, D&C  Surgeon: Fermin Schwab  Anesthesia: General  Complications: None  Estimated blood loss: Less than 20 mL  Specimen: Endometrial curettings to pathology  Findings: Endocervical canal appeared normal. The uterus sounded to 7 cm. Endometrial cavity had 15 x 10 x 5 mm left lateral polyp. Otherwise it was of normal appearance and normal configuration. Both tubal ostia were seen.  Description of procedure: Patient was placed in dorsal supine position. General anesthesia was administered. She was placed in lithotomy position. She was prepped and draped in sterile manner. A vaginal speculum was placed. A dilute vasopressin solution containing 0.33 units per milliliter was injected into the cervical stroma x 5 cc. A Slimline hysteroscope with 30 lens was inserted into the canal and above findings were noted. Distention medium was saline. Distention method was gravity. Above findings were noted.  Using hysteroscopic scissors the polyp was excised right at its stalk and then grasped with hysteroscopic graspers and removed and submitted to pathology. Using a Handivac with an attached 6 mm endometrium was curetted and the specimen was sent to pathology.  Hemostasis was insured. Instrument count was correct. Estimated blood loss was less than 20 mL. The patient tolerated the procedure well and was transferred to recovery in satisfactory condition.  Fermin Schwab, MD

## 2017-12-31 NOTE — Anesthesia Postprocedure Evaluation (Signed)
Anesthesia Post Note  Patient: Lindsay French  Procedure(s) Performed: HYSTEROSCOPY and polypectomy (N/A Vagina )     Patient location during evaluation: PACU Anesthesia Type: General Level of consciousness: awake and alert Pain management: pain level controlled Vital Signs Assessment: post-procedure vital signs reviewed and stable Respiratory status: spontaneous breathing, nonlabored ventilation and respiratory function stable Cardiovascular status: blood pressure returned to baseline and stable Postop Assessment: no apparent nausea or vomiting Anesthetic complications: no    Last Vitals:  Vitals:   12/31/17 1553 12/31/17 1600  BP:  129/79  Pulse: 87 92  Resp: (!) 28 20  Temp:    SpO2: 95% 96%    Last Pain:  Vitals:   12/31/17 1212  TempSrc: Oral                 [name] A.

## 2017-12-31 NOTE — H&P (Signed)
Lindsay French is a 37 y.o. female , originally referred to me for infertility and irregular periods.  She was diagnosed with endometrial polyps because of abnormal uterine bleeding.  Patient would like to preserve her childbearing potential.  Pertinent Gynecological History: Menses: flow is excessive with use of 3 pads or tampons on heaviest days Bleeding: dysfunctional uterine bleeding Contraception: none DES exposure: denies Blood transfusions: none Sexually transmitted diseases: no past history Last pap: normal   Menstrual History: Menarche age: 23    Past Medical History:  Diagnosis Date  . ADHD   . Anxiety   . Asthma    usually with colds  . Depression   . Elevated LFTs   . Fatty liver 04/16/2016   Noted [place] ABD  . GERD (gastroesophageal reflux disease)   . Heart murmur    at birth, no issues  . History of heat stroke    with syncope  . History of thrombocytopenia    prior to diagnosis HIV  . HIV (human immunodeficiency virus infection) (HCC)   . Hyperlipidemia   . Hypertension   . Hypothyroidism   . Obese   . PCOS (polycystic ovarian syndrome)   . Pneumonia 04/17/2016  . Seasonal allergies   . Wears glasses   . Wears partial dentures    upper  . Wrist fracture 11/2017   Left                    Past Surgical History:  Procedure Laterality Date  . maxillofacial surgery               Family History  Problem Relation Age of Onset  . Hypothyroidism Mother    No hereditary disease.  No cancer of breast, ovary, uterus. No cutaneous leiomyomatosis or renal cell carcinoma.  Social History   Socioeconomic History  . Marital status: Married    Spouse name: Not on file  . Number of children: Not on file  . Years of education: Not on file  . Highest education level: Not on file  Occupational History  . Not on file  Social Needs  . Financial resource strain: Not on file  . Food insecurity:    Worry: Not on file    Inability: Not on file  .  Transportation needs:    Medical: Not on file    Non-medical: Not on file  Tobacco Use  . Smoking status: Former Smoker    Packs/day: 0.30    Years: 15.00    Pack years: 4.50    Types: Cigarettes  . Smokeless tobacco: Never Used  Substance and Sexual Activity  . Alcohol use: Yes    Alcohol/week: 7.0 standard drinks    Types: 7 Glasses of wine per week    Comment: wine  2-3 times a week  . Drug use: Not Currently    Types: "Crack" cocaine    Comment: Previous hx of crack use - clean for 7 years   . Sexual activity: Yes    Partners: Male    Birth control/protection: None    Comment: try to conceive   Lifestyle  . Physical activity:    Days per week: Not on file    Minutes per session: Not on file  . Stress: Not on file  Relationships  . Social connections:    Talks on phone: Not on file    Gets together: Not on file    Attends religious service: Not on file    Active member  of club or organization: Not on file    Attends meetings of clubs or organizations: Not on file    Relationship status: Not on file  . Intimate partner violence:    Fear of current or ex partner: Not on file    Emotionally abused: Not on file    Physically abused: Not on file    Forced sexual activity: Not on file  Other Topics Concern  . Not on file  Social History Narrative   ** Merged History Encounter **        Allergies  Allergen Reactions  . Hydrocodone Itching    hot  . Latex Other (See Comments)    As a teenager when using latex condoms she would get a yeast infection.     No current facility-administered medications on file prior to encounter.    Current Outpatient Medications on File Prior to Encounter  Medication Sig Dispense Refill  . Levothyroxine Sodium 25 MCG CAPS Take by mouth daily before breakfast.    . metFORMIN (GLUCOPHAGE) 1000 MG tablet Take 1,000 mg by mouth 2 (two) times daily with a meal.    . ALPRAZolam (XANAX) 0.5 MG tablet Take 1 tablet by mouth 2 (two) times  daily as needed for anxiety.     . diphenhydramine-acetaminophen (TYLENOL PM) 25-500 MG TABS tablet Take 2 tablets by mouth at bedtime as needed (sleep).    Marland Kitchen emtricitabine-rilpivir-tenofovir AF (ODEFSEY) 200-25-25 MG TABS tablet Take 1 tablet by mouth daily with breakfast. (Patient taking differently: Take 1 tablet by mouth every evening. ) 30 tablet 2  . fexofenadine-pseudoephedrine (ALLEGRA-D) 60-120 MG 12 hr tablet Take 1 tablet by mouth daily as needed (allergies).    . folic acid (FOLVITE) 1 MG tablet Take 1 mg by mouth every evening.     Marland Kitchen HYDROMET 5-1.5 MG/5ML syrup Take 5 mLs by mouth every 6 (six) hours as needed for cough.  0  . losartan (COZAAR) 100 MG tablet Take 1 tablet by mouth every evening.     . naproxen sodium (ANAPROX) 220 MG tablet Take 660 mg by mouth 2 (two) times daily as needed (pain).    Marland Kitchen venlafaxine XR (EFFEXOR-XR) 75 MG 24 hr capsule Take 3 capsules by mouth every evening.        Review of Systems  Constitutional: Negative.   HENT: Negative.   Eyes: Negative.   Respiratory: Negative.   Cardiovascular: Negative.   Gastrointestinal: Negative.   Genitourinary: Negative.   Musculoskeletal: Negative.   Skin: Negative.   Neurological: Negative.   Endo/Heme/Allergies: Negative.   Psychiatric/Behavioral: Negative.     Physical Exam  Ht 5\' 2"  (1.575 m)   Wt 108.9 kg   LMP 12/08/2017 (Exact Date)   BMI 43.90 kg/m  Constitutional: She is oriented to person, place, and time. She appears well-developed and well-nourished.  HENT:  Head: Normocephalic and atraumatic.  Nose: Nose normal.  Mouth/Throat: Oropharynx is clear and moist. No oropharyngeal exudate.  Eyes: Conjunctivae normal and EOM are normal. Pupils are equal, round, and reactive to light. No scleral icterus.  Neck: Normal range of motion. Neck supple. No tracheal deviation present. No thyromegaly present.  Cardiovascular: Normal rate.   Respiratory: Effort normal and breath sounds normal.  GI: Soft.  Bowel sounds are normal. She exhibits no distension and no mass. There is no tenderness.  Lymphadenopathy:    She has no cervical adenopathy.  Neurological: She is alert and oriented to person, place, and time. She has normal reflexes.  Skin: Skin is warm.  Psychiatric: She has a normal mood and affect. Her behavior is normal. Judgment and thought content normal.   Assessment/Plan:  Endometrial polyp Morbid obesity, PCOS and AUB Infertility Benefits and risks of hysteroscopy and polypectomy were discussed.  All of patient's questions were answered.  She verbalized understanding.

## 2018-01-01 ENCOUNTER — Encounter (HOSPITAL_BASED_OUTPATIENT_CLINIC_OR_DEPARTMENT_OTHER): Payer: Self-pay | Admitting: Obstetrics and Gynecology

## 2018-01-01 LAB — ABO/RH: ABO/RH(D): A POS

## 2018-01-14 DIAGNOSIS — E282 Polycystic ovarian syndrome: Secondary | ICD-10-CM | POA: Diagnosis not present

## 2018-01-14 DIAGNOSIS — N970 Female infertility associated with anovulation: Secondary | ICD-10-CM | POA: Diagnosis not present

## 2018-01-15 DIAGNOSIS — E288 Other ovarian dysfunction: Secondary | ICD-10-CM | POA: Diagnosis not present

## 2018-01-20 DIAGNOSIS — N970 Female infertility associated with anovulation: Secondary | ICD-10-CM | POA: Diagnosis not present

## 2018-01-20 DIAGNOSIS — E669 Obesity, unspecified: Secondary | ICD-10-CM | POA: Diagnosis not present

## 2018-01-20 DIAGNOSIS — Z862 Personal history of diseases of the blood and blood-forming organs and certain disorders involving the immune mechanism: Secondary | ICD-10-CM | POA: Diagnosis not present

## 2018-01-20 DIAGNOSIS — E282 Polycystic ovarian syndrome: Secondary | ICD-10-CM | POA: Diagnosis not present

## 2018-01-21 DIAGNOSIS — Z862 Personal history of diseases of the blood and blood-forming organs and certain disorders involving the immune mechanism: Secondary | ICD-10-CM | POA: Diagnosis not present

## 2018-01-21 DIAGNOSIS — N970 Female infertility associated with anovulation: Secondary | ICD-10-CM | POA: Diagnosis not present

## 2018-01-21 DIAGNOSIS — E282 Polycystic ovarian syndrome: Secondary | ICD-10-CM | POA: Diagnosis not present

## 2018-01-21 DIAGNOSIS — E669 Obesity, unspecified: Secondary | ICD-10-CM | POA: Diagnosis not present

## 2018-01-22 DIAGNOSIS — E282 Polycystic ovarian syndrome: Secondary | ICD-10-CM | POA: Diagnosis not present

## 2018-01-22 DIAGNOSIS — E669 Obesity, unspecified: Secondary | ICD-10-CM | POA: Diagnosis not present

## 2018-01-22 DIAGNOSIS — N970 Female infertility associated with anovulation: Secondary | ICD-10-CM | POA: Diagnosis not present

## 2018-01-22 DIAGNOSIS — Z3189 Encounter for other procreative management: Secondary | ICD-10-CM | POA: Diagnosis not present

## 2018-01-22 DIAGNOSIS — Z862 Personal history of diseases of the blood and blood-forming organs and certain disorders involving the immune mechanism: Secondary | ICD-10-CM | POA: Diagnosis not present

## 2018-01-27 DIAGNOSIS — Z3189 Encounter for other procreative management: Secondary | ICD-10-CM | POA: Diagnosis not present

## 2018-02-13 DIAGNOSIS — N970 Female infertility associated with anovulation: Secondary | ICD-10-CM | POA: Diagnosis not present

## 2018-02-13 DIAGNOSIS — Z3189 Encounter for other procreative management: Secondary | ICD-10-CM | POA: Diagnosis not present

## 2018-02-13 DIAGNOSIS — Z862 Personal history of diseases of the blood and blood-forming organs and certain disorders involving the immune mechanism: Secondary | ICD-10-CM | POA: Diagnosis not present

## 2018-02-13 DIAGNOSIS — E669 Obesity, unspecified: Secondary | ICD-10-CM | POA: Diagnosis not present

## 2018-02-13 DIAGNOSIS — E282 Polycystic ovarian syndrome: Secondary | ICD-10-CM | POA: Diagnosis not present

## 2018-02-20 DIAGNOSIS — N970 Female infertility associated with anovulation: Secondary | ICD-10-CM | POA: Diagnosis not present

## 2018-02-20 DIAGNOSIS — Z3189 Encounter for other procreative management: Secondary | ICD-10-CM | POA: Diagnosis not present

## 2018-02-20 DIAGNOSIS — Z862 Personal history of diseases of the blood and blood-forming organs and certain disorders involving the immune mechanism: Secondary | ICD-10-CM | POA: Diagnosis not present

## 2018-02-20 DIAGNOSIS — E669 Obesity, unspecified: Secondary | ICD-10-CM | POA: Diagnosis not present

## 2018-02-20 DIAGNOSIS — Z113 Encounter for screening for infections with a predominantly sexual mode of transmission: Secondary | ICD-10-CM | POA: Diagnosis not present

## 2018-02-22 DIAGNOSIS — Z3189 Encounter for other procreative management: Secondary | ICD-10-CM | POA: Diagnosis not present

## 2018-02-24 DIAGNOSIS — E282 Polycystic ovarian syndrome: Secondary | ICD-10-CM | POA: Diagnosis not present

## 2018-02-24 DIAGNOSIS — E669 Obesity, unspecified: Secondary | ICD-10-CM | POA: Diagnosis not present

## 2018-02-24 DIAGNOSIS — N970 Female infertility associated with anovulation: Secondary | ICD-10-CM | POA: Diagnosis not present

## 2018-02-24 DIAGNOSIS — Z3189 Encounter for other procreative management: Secondary | ICD-10-CM | POA: Diagnosis not present

## 2018-02-24 DIAGNOSIS — Z862 Personal history of diseases of the blood and blood-forming organs and certain disorders involving the immune mechanism: Secondary | ICD-10-CM | POA: Diagnosis not present

## 2018-02-26 DIAGNOSIS — Z3189 Encounter for other procreative management: Secondary | ICD-10-CM | POA: Diagnosis not present

## 2018-03-14 DIAGNOSIS — Z862 Personal history of diseases of the blood and blood-forming organs and certain disorders involving the immune mechanism: Secondary | ICD-10-CM | POA: Diagnosis not present

## 2018-03-14 DIAGNOSIS — E669 Obesity, unspecified: Secondary | ICD-10-CM | POA: Diagnosis not present

## 2018-03-14 DIAGNOSIS — Z3189 Encounter for other procreative management: Secondary | ICD-10-CM | POA: Diagnosis not present

## 2018-03-14 DIAGNOSIS — N970 Female infertility associated with anovulation: Secondary | ICD-10-CM | POA: Diagnosis not present

## 2018-03-14 DIAGNOSIS — E282 Polycystic ovarian syndrome: Secondary | ICD-10-CM | POA: Diagnosis not present

## 2018-03-21 DIAGNOSIS — Z862 Personal history of diseases of the blood and blood-forming organs and certain disorders involving the immune mechanism: Secondary | ICD-10-CM | POA: Diagnosis not present

## 2018-03-21 DIAGNOSIS — E669 Obesity, unspecified: Secondary | ICD-10-CM | POA: Diagnosis not present

## 2018-03-21 DIAGNOSIS — Z319 Encounter for procreative management, unspecified: Secondary | ICD-10-CM | POA: Diagnosis not present

## 2018-03-21 DIAGNOSIS — N970 Female infertility associated with anovulation: Secondary | ICD-10-CM | POA: Diagnosis not present

## 2018-03-21 DIAGNOSIS — Z113 Encounter for screening for infections with a predominantly sexual mode of transmission: Secondary | ICD-10-CM | POA: Diagnosis not present

## 2018-03-24 ENCOUNTER — Other Ambulatory Visit: Payer: 59

## 2018-03-25 DIAGNOSIS — Z3189 Encounter for other procreative management: Secondary | ICD-10-CM | POA: Diagnosis not present

## 2018-04-07 ENCOUNTER — Ambulatory Visit: Payer: 59 | Admitting: Infectious Diseases

## 2018-04-08 ENCOUNTER — Ambulatory Visit: Payer: No Typology Code available for payment source | Admitting: Infectious Diseases

## 2018-04-08 ENCOUNTER — Ambulatory Visit: Payer: 59 | Admitting: Infectious Diseases

## 2018-04-10 ENCOUNTER — Ambulatory Visit: Payer: No Typology Code available for payment source | Admitting: Infectious Diseases

## 2018-04-24 ENCOUNTER — Ambulatory Visit: Payer: BLUE CROSS/BLUE SHIELD

## 2018-04-24 ENCOUNTER — Encounter: Payer: Self-pay | Admitting: Family

## 2018-04-24 ENCOUNTER — Ambulatory Visit: Payer: BLUE CROSS/BLUE SHIELD | Admitting: Family

## 2018-04-24 VITALS — BP 137/84 | HR 91 | Temp 98.7°F | Wt 233.0 lb

## 2018-04-24 DIAGNOSIS — B20 Human immunodeficiency virus [HIV] disease: Secondary | ICD-10-CM | POA: Diagnosis not present

## 2018-04-24 DIAGNOSIS — Z0000 Encounter for general adult medical examination without abnormal findings: Secondary | ICD-10-CM

## 2018-04-24 MED ORDER — EMTRICITAB-RILPIVIR-TENOFOV AF 200-25-25 MG PO TABS: 1.0000 | ORAL_TABLET | Freq: Every evening | ORAL | 11 refills | Status: DC

## 2018-04-24 NOTE — Assessment & Plan Note (Signed)
[name] has traditionally well controlled HIV disease with her current regimen of Odefsey with good adherence and tolerance. No signs/symptoms of opportunistic infection or progressive HIV disease. Discussed need for medication change if she becomes pregnant and [facility]. Counseled to avoid antacids and take with a large meal. Check blood work today. Continue current dose of Odefsey. Follow up office visit in 1 year or sooner if needed.

## 2018-04-24 NOTE — Assessment & Plan Note (Signed)
   Declines immunizations today  Declines referral to dentist at this time.   Remains sexually active and currently experiencing infertility managed by specialist.

## 2018-04-24 NOTE — Patient Instructions (Signed)
Nice to meet you!  We will check your blood work today.  Please continue to take your [facility] as prescribed.  Follow up in 1 year or sooner if needed with lab work 1-2 weeks prior to appointment.  Happy New Year!!!!

## 2018-04-24 NOTE — Progress Notes (Signed)
Subjective:    Patient ID: Lindsay French, female    DOB: [birth year], 38 y.o.   MRN: [id number]  Chief Complaint  Patient presents with  . HIV Positive/AIDS     HPI:  Lindsay French is a 38 y.o. female who presents today for routine follow up of HV disease.  Lindsay French was last seen in the office on 04/10/17 for routine follow up with good adherence and tolerance to her Odefsey. Blood work showed viral suppression and was undetectable with a CD4 count of 900.  Health maintenance due includes updated lab work, influenza vaccination, Prevnar, Menveo and a dental screening.   Lindsay French continues to take her Charlett Lango as prescribed with no adverse side effects or recalled missed doses. Receives her medication from [facility]. Working to become pregnant, however was unsuccessful in 3 IUI procedures and unsure if she will pursue IVF. Last Pap smear was 2 years ago and has not been to the dentist recently.   Denies fevers, chills, night sweats, headaches, changes in vision, neck pain/stiffness, nausea, diarrhea, vomiting, lesions or rashes.   Allergies  Allergen Reactions  . Hydrocodone Itching    hot  . Latex Other (See Comments)    As a teenager when using latex condoms she would get a yeast infection.       Outpatient Medications Prior to Visit  Medication Sig Dispense Refill  . ALPRAZolam (XANAX) 0.5 MG tablet Take 1 tablet by mouth 2 (two) times daily as needed for anxiety.     . diphenhydramine-acetaminophen (TYLENOL PM) 25-500 MG TABS tablet Take 2 tablets by mouth at bedtime as needed (sleep).    . fexofenadine-pseudoephedrine (ALLEGRA-D) 60-120 MG 12 hr tablet Take 1 tablet by mouth daily as needed (allergies).    Marland Kitchen HYDROMET 5-1.5 MG/5ML syrup Take 5 mLs by mouth every 6 (six) hours as needed for cough.  0  . letrozole (FEMARA) 2.5 MG tablet letrozole 2.5 mg tablet  Take 3 tablet(s) by oral route on days 3-7 of cycle.    . Levothyroxine Sodium 25 MCG CAPS Take by mouth daily before  breakfast.    . metFORMIN (GLUCOPHAGE) 1000 MG tablet Take 1,000 mg by mouth 2 (two) times daily with a meal.    . venlafaxine XR (EFFEXOR-XR) 75 MG 24 hr capsule Take 3 capsules by mouth every evening.     Marland Kitchen emtricitabine-rilpivir-tenofovir AF (ODEFSEY) 200-25-25 MG TABS tablet Take 1 tablet by mouth daily with breakfast. (Patient taking differently: Take 1 tablet by mouth every evening. ) 30 tablet 2  . losartan (COZAAR) 100 MG tablet Take 1 tablet by mouth every evening.     . naproxen sodium (ANAPROX) 220 MG tablet Take 660 mg by mouth 2 (two) times daily as needed (pain).    . folic acid (FOLVITE) 1 MG tablet Take 1 mg by mouth every evening.      No facility-administered medications prior to visit.      Past Medical History:  Diagnosis Date  . ADHD   . Anxiety   . Asthma    usually with colds  . Depression   . Elevated LFTs   . Fatty liver 04/16/2016   Noted [place] ABD  . GERD (gastroesophageal reflux disease)   . Heart murmur    at birth, no issues  . History of heat stroke    with syncope  . History of thrombocytopenia    prior to diagnosis HIV  . HIV (human immunodeficiency virus infection) (HCC)   . Hyperlipidemia   .  Hypertension   . Hypothyroidism   . Obese   . PCOS (polycystic ovarian syndrome)   . Pneumonia 04/17/2016  . Seasonal allergies   . Wears glasses   . Wears partial dentures    upper  . Wrist fracture 11/2017   Left     Past Surgical History:  Procedure Laterality Date  . HYSTEROSCOPY N/A 12/31/2017   Procedure: HYSTEROSCOPY and polypectomy;  Surgeon: Fermin SchwabYalcinkaya, Tamer, MD;  [facility] ;  Service: Gynecology;  Laterality: N/A;  . maxillofacial surgery         Review of Systems  Constitutional: Negative for appetite change, chills, diaphoresis, fatigue, fever and unexpected weight change.  Eyes:       Negative for acute change in vision  Respiratory: Negative for chest tightness, shortness of breath and wheezing.     Cardiovascular: Negative for chest pain.  Gastrointestinal: Negative for diarrhea, nausea and vomiting.  Genitourinary: Negative for dysuria, pelvic pain and vaginal discharge.  Musculoskeletal: Negative for neck pain and neck stiffness.  Skin: Negative for rash.  Neurological: Negative for seizures, syncope, weakness and headaches.  Hematological: Negative for adenopathy. Does not bruise/bleed easily.  Psychiatric/Behavioral: Negative for hallucinations.      Objective:    BP 137/84   Pulse 91   Temp 98.7 F (37.1 C) (Oral)   Wt 233 lb (105.7 kg)   LMP 04/12/2018   BMI 42.62 kg/m  Nursing note and vital signs reviewed.  Physical Exam Constitutional:      General: She is not in acute distress.    Appearance: She is well-developed.  Eyes:     Conjunctiva/sclera: Conjunctivae normal.  Neck:     Musculoskeletal: Neck supple.  Cardiovascular:     Rate and Rhythm: Normal rate and regular rhythm.     Heart sounds: Normal heart sounds. No murmur. No friction rub. No gallop.   Pulmonary:     Effort: Pulmonary effort is normal. No respiratory distress.     Breath sounds: Normal breath sounds. No wheezing or rales.  Chest:     Chest wall: No tenderness.  Abdominal:     General: Bowel sounds are normal.     Palpations: Abdomen is soft.     Tenderness: There is no abdominal tenderness.  Lymphadenopathy:     Cervical: No cervical adenopathy.  Skin:    General: Skin is warm and dry.     Findings: No rash.  Neurological:     Mental Status: She is alert and oriented to person, place, and time.  Psychiatric:        Behavior: Behavior normal.        Thought Content: Thought content normal.        Judgment: Judgment normal.        Assessment & Plan:   Problem List Items Addressed This Visit      Other   Human immunodeficiency virus (HIV) disease (HCC) - Primary    Lindsay French has traditionally well controlled HIV disease with her current regimen of Odefsey with good  adherence and tolerance. No signs/symptoms of opportunistic infection or progressive HIV disease. Discussed need for medication change if she becomes pregnant and [facility] to [facility]. Counseled to avoid antacids and take with a large meal. Check blood work today. Continue current dose of Odefsey. Follow up office visit in 1 year or sooner if needed.       Relevant Medications   emtricitabine-rilpivir-tenofovir AF (ODEFSEY) 200-25-25 MG TABS tablet  Other Relevant Orders   RPR   Comprehensive metabolic panel   CBC   HIV-1 RNA quant-no reflex-bld   T-helper cell (CD4)- ([facility] clinic only)   Lipid panel   Health care maintenance     Declines immunizations today  Declines referral to dentist at this time.   Remains sexually active and currently experiencing infertility managed by specialist.             I have discontinued Shelise Stokes's folic acid. I am also having her maintain her venlafaxine XR, ALPRAZolam, losartan, HYDROMET, naproxen sodium, diphenhydramine-acetaminophen, fexofenadine-pseudoephedrine, metFORMIN, Levothyroxine Sodium, letrozole, and emtricitabine-rilpivir-tenofovir AF.   Meds ordered this encounter  Medications  . DISCONTD: emtricitabine-rilpivir-tenofovir AF (ODEFSEY) 200-25-25 MG TABS tablet    Sig: Take 1 tablet by mouth every evening.    Dispense:  30 tablet    Refill:  11    Order Specific Question:   Supervising Provider    Answer:   Judyann MunsonSNIDER, CYNTHIA [[id number]]  . emtricitabine-rilpivir-tenofovir AF (ODEFSEY) 200-25-25 MG TABS tablet    Sig: Take 1 tablet by mouth every evening.    Dispense:  30 tablet    Refill:  11    Order Specific Question:   Supervising Provider    Answer:   Judyann MunsonSNIDER, CYNTHIA [[id number]]     Follow-up: Return in about 1 year (around 04/25/2019), or if symptoms worsen or fail to improve.   Marcos EkeGreg Calone, MSN, FNP-C Nurse Practitioner [facility] Office phone:  [phone] Pager: [phone] [facility] Main number: [phone]

## 2018-04-25 LAB — T-HELPER CELL (CD4) - (RCID CLINIC ONLY)
CD4 % Helper T Cell: 41 % (ref 33–55)
CD4 T Cell Abs: 640 /uL (ref 400–2700)

## 2018-04-29 LAB — COMPREHENSIVE METABOLIC PANEL
AG Ratio: 1.7 (calc) (ref 1.0–2.5)
ALT: 37 U/L — ABNORMAL HIGH (ref 6–29)
AST: 20 U/L (ref 10–30)
Albumin: 4.3 g/dL (ref 3.6–5.1)
Alkaline phosphatase (APISO): 98 U/L (ref 33–115)
BUN: 8 mg/dL (ref 7–25)
CO2: 28 mmol/L (ref 20–32)
Calcium: 9.7 mg/dL (ref 8.6–10.2)
Chloride: 99 mmol/L (ref 98–110)
Creat: 0.63 mg/dL (ref 0.50–1.10)
Globulin: 2.6 g/dL (calc) (ref 1.9–3.7)
Glucose, Bld: 90 mg/dL (ref 65–99)
Potassium: 4.4 mmol/L (ref 3.5–5.3)
Sodium: 136 mmol/L (ref 135–146)
Total Bilirubin: 0.4 mg/dL (ref 0.2–1.2)
Total Protein: 6.9 g/dL (ref 6.1–8.1)

## 2018-04-29 LAB — RPR: RPR Ser Ql: NONREACTIVE

## 2018-04-29 LAB — HIV-1 RNA QUANT-NO REFLEX-BLD
HIV 1 RNA Quant: 25 copies/mL — ABNORMAL HIGH
HIV-1 RNA Quant, Log: 1.4 Log copies/mL — ABNORMAL HIGH

## 2018-04-29 LAB — CBC
HCT: 39.8 % (ref 35.0–45.0)
Hemoglobin: 13.9 g/dL (ref 11.7–15.5)
MCH: 32.9 pg (ref 27.0–33.0)
MCHC: 34.9 g/dL (ref 32.0–36.0)
MCV: 94.1 fL (ref 80.0–100.0)
MPV: 10.8 fL (ref 7.5–12.5)
Platelets: 322 10*3/uL (ref 140–400)
RBC: 4.23 10*6/uL (ref 3.80–5.10)
RDW: 11.8 % (ref 11.0–15.0)
WBC: 9.2 10*3/uL (ref 3.8–10.8)

## 2018-04-29 LAB — LIPID PANEL
Cholesterol: 219 mg/dL — ABNORMAL HIGH (ref <200)
HDL: 47 mg/dL — ABNORMAL LOW (ref >50)
LDL Cholesterol (Calc): 141 mg/dL (calc) — ABNORMAL HIGH
Non-HDL Cholesterol (Calc): 172 mg/dL (calc) — ABNORMAL HIGH (ref <130)
Total CHOL/HDL Ratio: 4.7 (calc) (ref <5.0)
Triglycerides: 168 mg/dL — ABNORMAL HIGH (ref <150)

## 2018-05-23 ENCOUNTER — Encounter: Payer: Self-pay | Admitting: Infectious Diseases

## 2018-06-26 DIAGNOSIS — H10011 Acute follicular conjunctivitis, right eye: Secondary | ICD-10-CM | POA: Diagnosis not present

## 2018-06-26 DIAGNOSIS — I10 Essential (primary) hypertension: Secondary | ICD-10-CM | POA: Diagnosis not present

## 2018-06-27 ENCOUNTER — Other Ambulatory Visit: Payer: Self-pay | Admitting: Pharmacist

## 2018-06-27 DIAGNOSIS — B20 Human immunodeficiency virus [HIV] disease: Secondary | ICD-10-CM

## 2018-06-30 DIAGNOSIS — F419 Anxiety disorder, unspecified: Secondary | ICD-10-CM | POA: Diagnosis not present

## 2018-06-30 DIAGNOSIS — I10 Essential (primary) hypertension: Secondary | ICD-10-CM | POA: Diagnosis not present

## 2018-06-30 DIAGNOSIS — F324 Major depressive disorder, single episode, in partial remission: Secondary | ICD-10-CM | POA: Diagnosis not present

## 2018-08-18 DIAGNOSIS — F419 Anxiety disorder, unspecified: Secondary | ICD-10-CM | POA: Diagnosis not present

## 2018-08-18 DIAGNOSIS — F324 Major depressive disorder, single episode, in partial remission: Secondary | ICD-10-CM | POA: Diagnosis not present

## 2018-11-18 DIAGNOSIS — Z111 Encounter for screening for respiratory tuberculosis: Secondary | ICD-10-CM | POA: Diagnosis not present

## 2018-11-21 DIAGNOSIS — Z111 Encounter for screening for respiratory tuberculosis: Secondary | ICD-10-CM | POA: Diagnosis not present

## 2019-01-21 DIAGNOSIS — F324 Major depressive disorder, single episode, in partial remission: Secondary | ICD-10-CM | POA: Diagnosis not present

## 2019-01-21 DIAGNOSIS — F419 Anxiety disorder, unspecified: Secondary | ICD-10-CM | POA: Diagnosis not present

## 2019-01-21 DIAGNOSIS — G4700 Insomnia, unspecified: Secondary | ICD-10-CM | POA: Diagnosis not present

## 2019-01-21 DIAGNOSIS — I10 Essential (primary) hypertension: Secondary | ICD-10-CM | POA: Diagnosis not present

## 2019-01-23 ENCOUNTER — Other Ambulatory Visit: Payer: Self-pay

## 2019-01-23 DIAGNOSIS — B20 Human immunodeficiency virus [HIV] disease: Secondary | ICD-10-CM

## 2019-01-23 MED ORDER — ODEFSEY 200-25-25 MG PO TABS: 1.0000 | ORAL_TABLET | Freq: Every day | ORAL | 3 refills | Status: DC

## 2019-01-24 DIAGNOSIS — Z20828 Contact with and (suspected) exposure to other viral communicable diseases: Secondary | ICD-10-CM | POA: Diagnosis not present

## 2019-01-27 ENCOUNTER — Telehealth: Payer: Self-pay | Admitting: Pharmacy Technician

## 2019-01-27 ENCOUNTER — Encounter: Payer: Self-pay | Admitting: Infectious Diseases

## 2019-01-27 NOTE — Telephone Encounter (Signed)
[facility] Patient Advocate Encounter  Completed and sent [employer] Advancing Access application for [facility] for this patient who is uninsured.    Patient is approved 01/27/2019 through 02/27/2019.  BIN      [id number] PCN    [id number] GRP    [id number] ID        [id number]  This will bridge her until she is HMAP approved.   Lindsay French. [name] [place] Patient [facility] Phone: [phone] Fax:  [phone]

## 2019-02-02 DIAGNOSIS — M9903 Segmental and somatic dysfunction of lumbar region: Secondary | ICD-10-CM | POA: Diagnosis not present

## 2019-02-02 DIAGNOSIS — M9905 Segmental and somatic dysfunction of pelvic region: Secondary | ICD-10-CM | POA: Diagnosis not present

## 2019-02-02 DIAGNOSIS — M9902 Segmental and somatic dysfunction of thoracic region: Secondary | ICD-10-CM | POA: Diagnosis not present

## 2019-02-02 DIAGNOSIS — M5386 Other specified dorsopathies, lumbar region: Secondary | ICD-10-CM | POA: Diagnosis not present

## 2019-02-04 DIAGNOSIS — M9902 Segmental and somatic dysfunction of thoracic region: Secondary | ICD-10-CM | POA: Diagnosis not present

## 2019-02-04 DIAGNOSIS — M5386 Other specified dorsopathies, lumbar region: Secondary | ICD-10-CM | POA: Diagnosis not present

## 2019-02-04 DIAGNOSIS — M9903 Segmental and somatic dysfunction of lumbar region: Secondary | ICD-10-CM | POA: Diagnosis not present

## 2019-02-04 DIAGNOSIS — M9905 Segmental and somatic dysfunction of pelvic region: Secondary | ICD-10-CM | POA: Diagnosis not present

## 2019-02-09 DIAGNOSIS — M9903 Segmental and somatic dysfunction of lumbar region: Secondary | ICD-10-CM | POA: Diagnosis not present

## 2019-02-09 DIAGNOSIS — M5386 Other specified dorsopathies, lumbar region: Secondary | ICD-10-CM | POA: Diagnosis not present

## 2019-02-09 DIAGNOSIS — M9905 Segmental and somatic dysfunction of pelvic region: Secondary | ICD-10-CM | POA: Diagnosis not present

## 2019-02-09 DIAGNOSIS — M9902 Segmental and somatic dysfunction of thoracic region: Secondary | ICD-10-CM | POA: Diagnosis not present

## 2019-02-11 DIAGNOSIS — M9902 Segmental and somatic dysfunction of thoracic region: Secondary | ICD-10-CM | POA: Diagnosis not present

## 2019-02-11 DIAGNOSIS — M9903 Segmental and somatic dysfunction of lumbar region: Secondary | ICD-10-CM | POA: Diagnosis not present

## 2019-02-11 DIAGNOSIS — M5386 Other specified dorsopathies, lumbar region: Secondary | ICD-10-CM | POA: Diagnosis not present

## 2019-02-11 DIAGNOSIS — M9905 Segmental and somatic dysfunction of pelvic region: Secondary | ICD-10-CM | POA: Diagnosis not present

## 2019-03-03 ENCOUNTER — Other Ambulatory Visit: Payer: Self-pay | Admitting: *Deleted

## 2019-03-03 DIAGNOSIS — B20 Human immunodeficiency virus [HIV] disease: Secondary | ICD-10-CM

## 2019-03-04 ENCOUNTER — Other Ambulatory Visit: Payer: Self-pay

## 2019-03-04 ENCOUNTER — Other Ambulatory Visit: Payer: BLUE CROSS/BLUE SHIELD

## 2019-03-04 DIAGNOSIS — B20 Human immunodeficiency virus [HIV] disease: Secondary | ICD-10-CM

## 2019-03-05 LAB — T-HELPER CELL (CD4) - (RCID CLINIC ONLY)
CD4 % Helper T Cell: 46 % (ref 33–65)
CD4 T Cell Abs: 773 /uL (ref 400–1790)

## 2019-03-08 LAB — COMPLETE METABOLIC PANEL WITH GFR
AG Ratio: 1.9 (calc) (ref 1.0–2.5)
ALT: 112 U/L — ABNORMAL HIGH (ref 6–29)
AST: 132 U/L — ABNORMAL HIGH (ref 10–30)
Albumin: 4.5 g/dL (ref 3.6–5.1)
Alkaline phosphatase (APISO): 101 U/L (ref 31–125)
BUN: 9 mg/dL (ref 7–25)
CO2: 29 mmol/L (ref 20–32)
Calcium: 9.9 mg/dL (ref 8.6–10.2)
Chloride: 99 mmol/L (ref 98–110)
Creat: 0.64 mg/dL (ref 0.50–1.10)
GFR, Est African American: 132 mL/min/{1.73_m2} (ref >=60)
GFR, Est Non African American: 114 mL/min/{1.73_m2} (ref >=60)
Globulin: 2.4 g/dL (calc) (ref 1.9–3.7)
Glucose, Bld: 107 mg/dL — ABNORMAL HIGH (ref 65–99)
Potassium: 4.4 mmol/L (ref 3.5–5.3)
Sodium: 138 mmol/L (ref 135–146)
Total Bilirubin: 0.6 mg/dL (ref 0.2–1.2)
Total Protein: 6.9 g/dL (ref 6.1–8.1)

## 2019-03-08 LAB — CBC WITH DIFFERENTIAL/PLATELET
Absolute Monocytes: 1041 cells/uL — ABNORMAL HIGH (ref 200–950)
Basophils Absolute: 9 cells/uL (ref 0–200)
Basophils Relative: 0.1 %
Eosinophils Absolute: 9 cells/uL — ABNORMAL LOW (ref 15–500)
Eosinophils Relative: 0.1 %
HCT: 41.6 % (ref 35.0–45.0)
Hemoglobin: 13.9 g/dL (ref 11.7–15.5)
Lymphs Abs: 1643 cells/uL (ref 850–3900)
MCH: 32.1 pg (ref 27.0–33.0)
MCHC: 33.4 g/dL (ref 32.0–36.0)
MCV: 96.1 fL (ref 80.0–100.0)
MPV: 10.8 fL (ref 7.5–12.5)
Monocytes Relative: 12.1 %
Neutro Abs: 5900 cells/uL (ref 1500–7800)
Neutrophils Relative %: 68.6 %
Platelets: 257 10*3/uL (ref 140–400)
RBC: 4.33 10*6/uL (ref 3.80–5.10)
RDW: 13 % (ref 11.0–15.0)
Total Lymphocyte: 19.1 %
WBC: 8.6 10*3/uL (ref 3.8–10.8)

## 2019-03-08 LAB — HIV-1 RNA QUANT-NO REFLEX-BLD
HIV 1 RNA Quant: 38 copies/mL — ABNORMAL HIGH
HIV-1 RNA Quant, Log: 1.58 Log copies/mL — ABNORMAL HIGH

## 2019-03-10 ENCOUNTER — Telehealth: Payer: Self-pay | Admitting: Pharmacy Technician

## 2019-03-10 NOTE — Telephone Encounter (Addendum)
[facility] Patient Advocate Encounter  Completed and sent [employer] Advancing Access application for [facility] for this patient who is uninsured.    Patient is approved 03/10/2019 through 03/09/2020.  BIN      [id number] PCN    [id number] GRP    [id number] ID        [id number]   Lindsay French Patient [facility] for Infectious Disease Phone: [phone] Fax:  [phone]

## 2019-03-17 NOTE — Telephone Encounter (Addendum)
[facility] Patient Advocate Encounter  [place] faxed notification that the patient has been removed from the Advancing Access program due to insurance status, effective 02/27/2019.  [place] Payer number: [phone] ID: [id number]  Patient will be withdrawn from the program 07/14/2019 per the newest fax from [place]

## 2019-03-24 ENCOUNTER — Ambulatory Visit (INDEPENDENT_AMBULATORY_CARE_PROVIDER_SITE_OTHER): Payer: BC Managed Care – PPO | Admitting: Infectious Diseases

## 2019-03-24 ENCOUNTER — Other Ambulatory Visit: Payer: Self-pay

## 2019-03-24 ENCOUNTER — Encounter: Payer: Self-pay | Admitting: Infectious Diseases

## 2019-03-24 DIAGNOSIS — M549 Dorsalgia, unspecified: Secondary | ICD-10-CM | POA: Insufficient documentation

## 2019-03-24 DIAGNOSIS — E669 Obesity, unspecified: Secondary | ICD-10-CM | POA: Diagnosis not present

## 2019-03-24 DIAGNOSIS — M545 Low back pain, unspecified: Secondary | ICD-10-CM

## 2019-03-24 DIAGNOSIS — F909 Attention-deficit hyperactivity disorder, unspecified type: Secondary | ICD-10-CM | POA: Diagnosis not present

## 2019-03-24 DIAGNOSIS — G8929 Other chronic pain: Secondary | ICD-10-CM

## 2019-03-24 DIAGNOSIS — N979 Female infertility, unspecified: Secondary | ICD-10-CM

## 2019-03-24 DIAGNOSIS — B20 Human immunodeficiency virus [HIV] disease: Secondary | ICD-10-CM

## 2019-03-24 DIAGNOSIS — E66812 Obesity, class 2: Secondary | ICD-10-CM

## 2019-03-24 DIAGNOSIS — M79605 Pain in left leg: Secondary | ICD-10-CM | POA: Insufficient documentation

## 2019-03-24 DIAGNOSIS — E282 Polycystic ovarian syndrome: Secondary | ICD-10-CM

## 2019-03-24 DIAGNOSIS — Z6839 Body mass index (BMI) 39.0-39.9, adult: Secondary | ICD-10-CM

## 2019-03-24 MED ORDER — DOVATO 50-300 MG PO TABS: 1.0000 | ORAL_TABLET | Freq: Every day | ORAL | 3 refills | Status: DC

## 2019-03-24 NOTE — Progress Notes (Signed)
   Subjective:    Patient ID: Lindsay French, female    DOB: 19-Apr-1981, 38 y.o.   MRN: [id number]  HPI 38 yo F with HIV+ (dx 2008) prev on atripla -->truvada/prezcobix ---> complera--> odefsy.  Has been having back pain. Not clear if injury, wt gain, both. Was seen by chiropracter without improvement.  Wants to change ART due to "buffalo neck".  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   Has not have PAP this year.   HIV 1 RNA Quant (copies/mL)  Date Value  03/04/2019 38 (H)  04/24/2018 25 (H)  03/07/2017 <20 DETECTED   CD4 T Cell Abs (/uL)  Date Value  03/04/2019 773  04/24/2018 640  03/07/2017 900    Review of Systems  Constitutional: Positive for unexpected weight change. Negative for appetite change.  Respiratory: Negative for cough and choking.   Gastrointestinal: Negative for constipation and diarrhea.  Genitourinary: Negative for difficulty urinating.  Musculoskeletal: Positive for back pain.       Objective:   Physical Exam Constitutional:      Appearance: She is obese.  HENT:     Mouth/Throat:     Mouth: Mucous membranes are moist.     Pharynx: No oropharyngeal exudate.  Eyes:     Extraocular Movements: Extraocular movements intact.     Pupils: Pupils are equal, round, and reactive to light.  Neck:     Musculoskeletal: Normal range of motion and neck supple.  Cardiovascular:     Rate and Rhythm: Normal rate and regular rhythm.  Pulmonary:     Effort: Pulmonary effort is normal.     Breath sounds: Normal breath sounds.  Abdominal:     General: Bowel sounds are normal. There is no distension.     Palpations: Abdomen is soft.     Tenderness: There is no abdominal tenderness.  Musculoskeletal: Normal range of motion.  Neurological:     General: No focal deficit present.     Mental Status: She is alert.     Sensory: No sensory deficit.     Motor: No weakness.  Psychiatric:        Mood and Affect: Mood normal.            Assessment & Plan:

## 2019-03-24 NOTE — Assessment & Plan Note (Signed)
She is interested in restarting her ritalin (potentially for wt loss benefits).

## 2019-03-24 NOTE — Assessment & Plan Note (Signed)
Had 6 failed IUI last several years. Is done trying.  Has PCOS as well.

## 2019-03-24 NOTE — Assessment & Plan Note (Signed)
We discussed imaging (per her chiropracter she has scoliosis).  Will defer MRI at this point.

## 2019-03-24 NOTE — Assessment & Plan Note (Addendum)
HIV, ART, PCOS, not clear of exact cause.   Offered to send her to bariatric clinic. She is trying not to go out too much so she defers.

## 2019-03-24 NOTE — Assessment & Plan Note (Addendum)
Is currently off ART for last 5 days.  Will change her to symtuza or dovato.  We discussed pros, cons and will change to dovato.  Will see her back in 3 months.  Does not want flu shot She will f/u re: PAP.  Offered/refused condoms.

## 2019-03-24 NOTE — Assessment & Plan Note (Signed)
previously on metformin.

## 2019-03-26 ENCOUNTER — Other Ambulatory Visit: Payer: Self-pay | Admitting: *Deleted

## 2019-03-26 DIAGNOSIS — B20 Human immunodeficiency virus [HIV] disease: Secondary | ICD-10-CM

## 2019-03-26 MED ORDER — DOVATO 50-300 MG PO TABS: 1.0000 | ORAL_TABLET | Freq: Every day | ORAL | 3 refills | Status: DC

## 2019-03-27 DIAGNOSIS — F902 Attention-deficit hyperactivity disorder, combined type: Secondary | ICD-10-CM | POA: Diagnosis not present

## 2019-05-25 ENCOUNTER — Encounter: Payer: Self-pay | Admitting: Infectious Diseases

## 2019-06-23 ENCOUNTER — Ambulatory Visit: Payer: BC Managed Care – PPO | Admitting: Infectious Diseases

## 2019-09-30 ENCOUNTER — Encounter: Payer: Self-pay | Admitting: Infectious Diseases

## 2019-10-23 ENCOUNTER — Emergency Department (HOSPITAL_COMMUNITY)
Admission: EM | Admit: 2019-10-23 | Discharge: 2019-10-23 | Disposition: A | Discharge status: Home / Self Care | Payer: No Typology Code available for payment source | Attending: Emergency Medicine | Admitting: Emergency Medicine

## 2019-10-23 ENCOUNTER — Emergency Department (HOSPITAL_COMMUNITY): Payer: No Typology Code available for payment source

## 2019-10-23 ENCOUNTER — Other Ambulatory Visit: Payer: Self-pay

## 2019-10-23 ENCOUNTER — Encounter (HOSPITAL_COMMUNITY): Payer: Self-pay

## 2019-10-23 DIAGNOSIS — J45909 Unspecified asthma, uncomplicated: Secondary | ICD-10-CM | POA: Insufficient documentation

## 2019-10-23 DIAGNOSIS — Z91040 Latex allergy status: Secondary | ICD-10-CM | POA: Insufficient documentation

## 2019-10-23 DIAGNOSIS — Z79899 Other long term (current) drug therapy: Secondary | ICD-10-CM | POA: Insufficient documentation

## 2019-10-23 DIAGNOSIS — W010XXA Fall on same level from slipping, tripping and stumbling without subsequent striking against object, initial encounter: Secondary | ICD-10-CM | POA: Insufficient documentation

## 2019-10-23 DIAGNOSIS — S6992XA Unspecified injury of left wrist, hand and finger(s), initial encounter: Secondary | ICD-10-CM | POA: Insufficient documentation

## 2019-10-23 DIAGNOSIS — Z87891 Personal history of nicotine dependence: Secondary | ICD-10-CM | POA: Insufficient documentation

## 2019-10-23 DIAGNOSIS — Y9301 Activity, walking, marching and hiking: Secondary | ICD-10-CM | POA: Insufficient documentation

## 2019-10-23 DIAGNOSIS — S62102A Fracture of unspecified carpal bone, left wrist, initial encounter for closed fracture: Secondary | ICD-10-CM

## 2019-10-23 DIAGNOSIS — E039 Hypothyroidism, unspecified: Secondary | ICD-10-CM | POA: Insufficient documentation

## 2019-10-23 DIAGNOSIS — I10 Essential (primary) hypertension: Secondary | ICD-10-CM | POA: Insufficient documentation

## 2019-10-23 DIAGNOSIS — Y9271 Barn as the place of occurrence of the external cause: Secondary | ICD-10-CM | POA: Insufficient documentation

## 2019-10-23 DIAGNOSIS — Y999 Unspecified external cause status: Secondary | ICD-10-CM | POA: Insufficient documentation

## 2019-10-23 IMAGING — CR DG WRIST COMPLETE 3+V*L*
4 series · 4 of 4 positions shown · non-contrast
Comparison: No prior.

CLINICAL DATA: Left wrist injury.

EXAM:
LEFT WRIST - COMPLETE 3+ VIEW

[x wrist pa left]
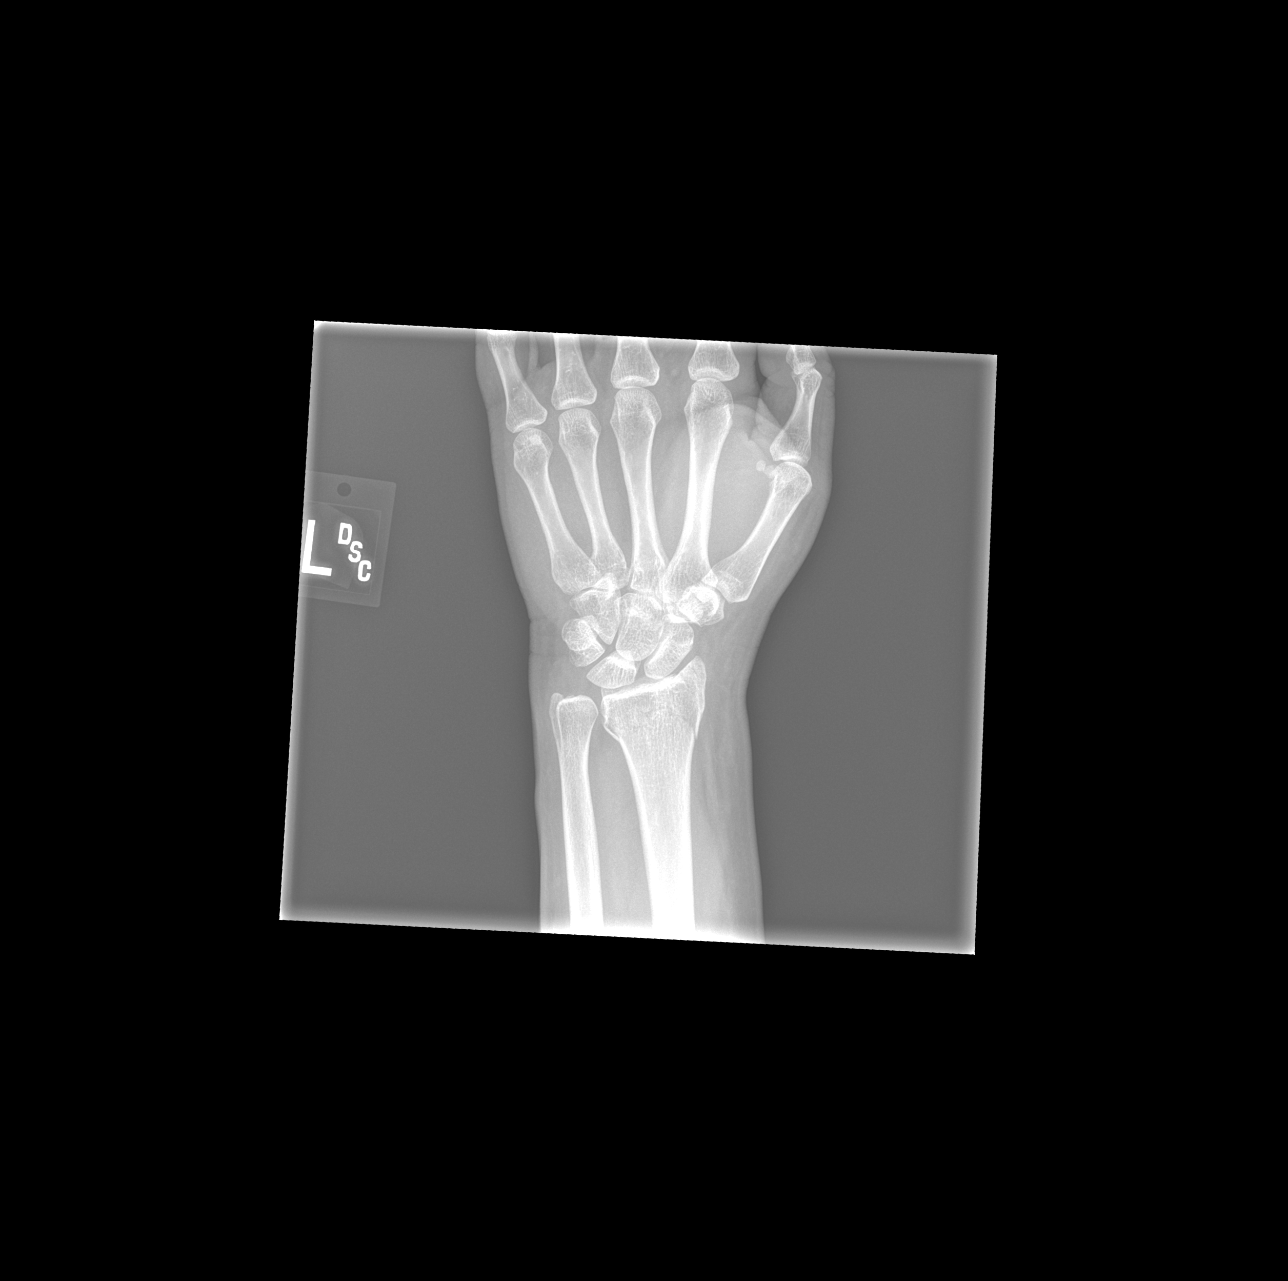

[x wrist obl left]
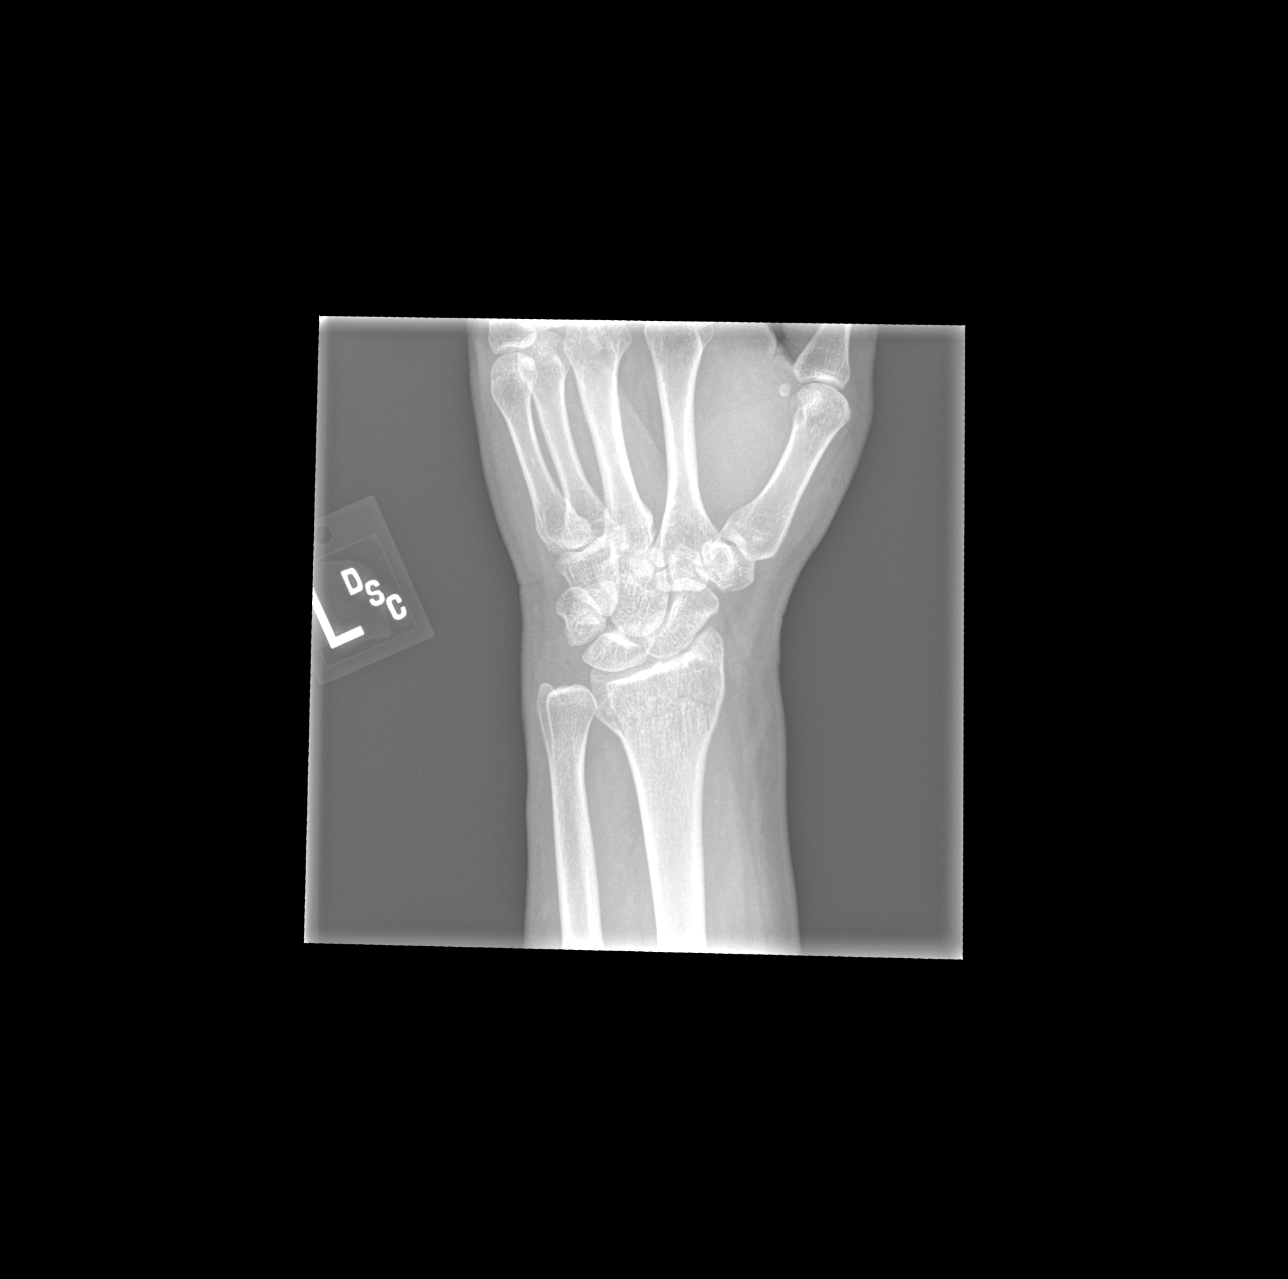

[x wrist lat left]
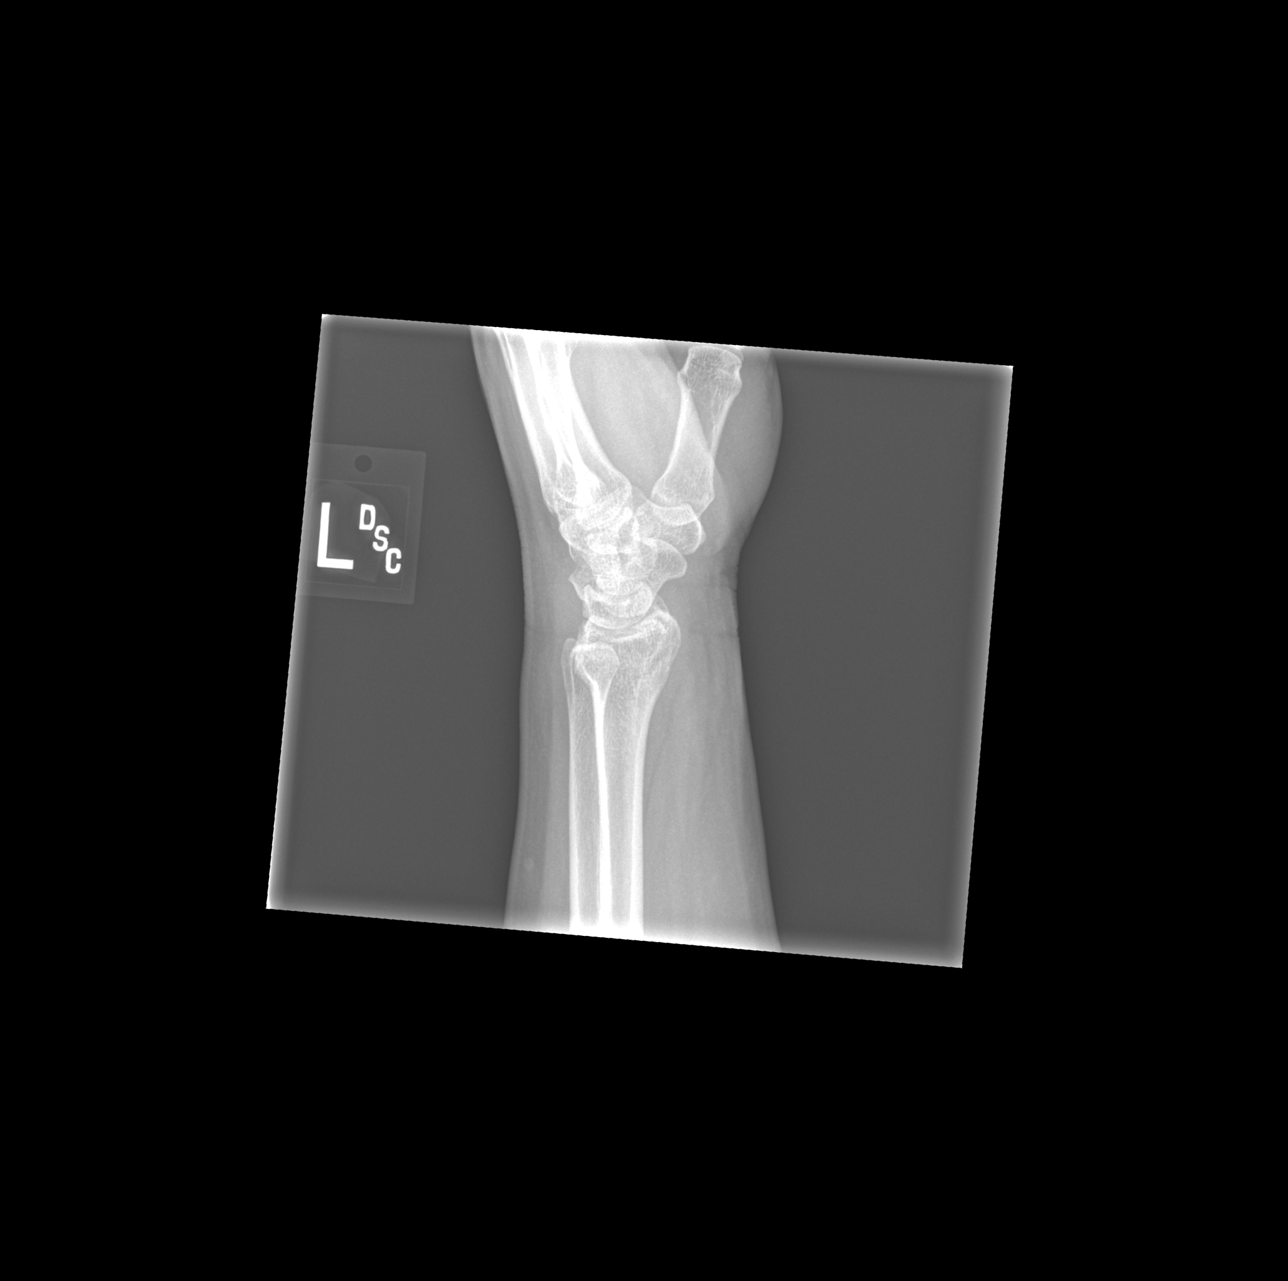

[x wrist navicular view left]
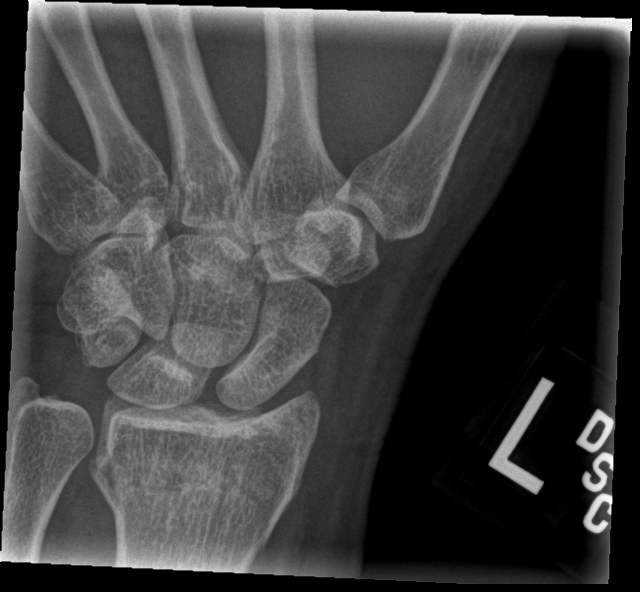

[4 of 4 positions shown; findings below may reference images not displayed]

FINDINGS: Nondisplaced fracture of the distal left radius noted. No other
focal abnormality identified. No evidence of dislocation.
IMPRESSION: Nondisplaced fracture of the distal left radius.

## 2019-10-23 MED ORDER — KETOROLAC TROMETHAMINE 60 MG/2ML IM SOLN
60.0000 mg | Freq: Once | INTRAMUSCULAR | Status: AC
  Administered 2019-10-23: 60 mg via INTRAMUSCULAR
  Filled 2019-10-23: qty 2

## 2019-10-23 NOTE — Discharge Instructions (Addendum)
Please take Tylenol/ibuprofen for pain.  Please keep your wrist in the splint and keep it dry until you see orthopedic doctor.  Return to the ER if your symptoms worsen.

## 2019-10-23 NOTE — ED Triage Notes (Signed)
Patient states she was getting a stroller out of her shed and was standing on a wet wooden ramp and slipped. Patient used her left hand to catch her fall. patient c/o left wrist pain. Patient states she had a fracture to the left wrist 2 years ago.

## 2019-10-23 NOTE — ED Notes (Signed)
Ortho paged. 

## 2019-10-23 NOTE — ED Provider Notes (Signed)
[facility]-EMERGENCY DEPT Provider Note   CSN: [id number] Arrival date & time: 10/23/19  1013     History Chief Complaint  Patient presents with  . Wrist Injury    Lindsay French is a 39 y.o. female.  HPI 39 year old female with history of HIV, hyperlipidemia, GERD, obesity, hypothyroidism, hypertension, fatty liver disease presents to the ER for a fall and left wrist pain.  Patient states that she was walking up to her shed this morning to take out a stroller, and as she was trying to open up the stroller, slipped on the ramp and fell, using her left hand to catch her fall.  Patient states she has a history of a left wrist fracture which several years ago.  Reports significant pain to the left wrist, difficulty moving.  No numbness or tingling.  Denies any head injury, LOC.    Past Medical History:  Diagnosis Date  . ADHD   . Anxiety   . Asthma    usually with colds  . Depression   . Elevated LFTs   . Fatty liver 04/16/2016   Noted [place] ABD  . GERD (gastroesophageal reflux disease)   . Heart murmur    at birth, no issues  . History of heat stroke    with syncope  . History of thrombocytopenia    prior to diagnosis HIV  . HIV (human immunodeficiency virus infection) (HCC)   . Hyperlipidemia   . Hypertension   . Hypothyroidism   . Obese   . PCOS (polycystic ovarian syndrome)   . Pneumonia 04/17/2016  . Seasonal allergies   . Wears glasses   . Wears partial dentures    upper  . Wrist fracture 11/2017   Left    Patient Active Problem List   Diagnosis Date Noted  . Back pain 03/24/2019  . Infertility, female 03/24/2019  . PCOS (polycystic ovarian syndrome) 03/24/2019  . Human immunodeficiency virus (HIV) disease (HCC) 04/24/2018  . Health care maintenance 04/24/2018  . Hyperlipidemia 04/10/2017  . Community acquired pneumonia of right upper lobe of lung 04/16/2016  . Acute respiratory failure with hypoxia (HCC) 04/16/2016  . Acute bronchitis  04/16/2016  . Hypertension 04/16/2016  . Obesity 03/21/2016  . Screening examination for venereal disease 03/30/2014  . Encounter for long-term (current) use of medications 03/30/2014  . Idiopathic thrombocytopenic purpura (ITP) (HCC) 11/14/2010  . ADHD (attention deficit hyperactivity disorder) 11/14/2010    Past Surgical History:  Procedure Laterality Date  . HYSTEROSCOPY N/A 12/31/2017   Procedure: HYSTEROSCOPY and polypectomy;  Surgeon: Fermin Schwab, MD;  Location: [facility];  Service: Gynecology;  Laterality: N/A;  . maxillofacial surgery       OB History   No obstetric history on file.     Family History  Problem Relation Age of Onset  . Hypothyroidism Mother   . Hypertension Father     Social History   Tobacco Use  . Smoking status: Former Smoker    Packs/day: 0.30    Years: 15.00    Pack years: 4.50    Types: Cigarettes  . Smokeless tobacco: Never Used  Vaping Use  . Vaping Use: Never used  Substance Use Topics  . Alcohol use: Yes    Alcohol/week: 7.0 standard drinks    Types: 7 Glasses of wine per week    Comment: wine  2-3 times a week  . Drug use: Not Currently    Types: "Crack" cocaine    Comment: Previous hx of  crack use - clean for 7 years     Home Medications Prior to Admission medications   Medication Sig Start Date End Date Taking? Authorizing Provider  albuterol (ACCUNEB) 0.63 MG/3ML nebulizer solution albuterol sulfate    [provider]  ALPRAZolam (XANAX) 0.5 MG tablet Take 1 tablet by mouth 2 (two) times daily as needed for anxiety.  12/21/15   [provider]  Chorionic Gonadotropin (NOVAREL) 5000 units SOLR Novarel 5,000 unit intramuscular solution  MIX WITH 1CC OF DILUENT AS DIRECTED. INJECT DOSE SUB- CUTANEOUSLY AT EXACT TIME & DATE INSTRUCTED BY YOUR MD.    [provider]  citalopram (CELEXA) 20 MG tablet citalopram 20 mg tablet    [provider]  diphenhydramine-acetaminophen  (TYLENOL PM) 25-500 MG TABS tablet Take 2 tablets by mouth at bedtime as needed (sleep).    [provider]  fexofenadine-pseudoephedrine (ALLEGRA-D) 60-120 MG 12 hr tablet Take 1 tablet by mouth daily as needed (allergies).    [provider]  HYDROMET 5-1.5 MG/5ML syrup Take 5 mLs by mouth every 6 (six) hours as needed for cough. 04/11/16   [provider]  irbesartan (AVAPRO) 150 MG tablet Take 150 mg by mouth daily. 02/06/19   [provider]  letrozole (FEMARA) 2.5 MG tablet letrozole 2.5 mg tablet  Take 3 tablet(s) by oral route on days 3-7 of cycle.    [provider]  Levothyroxine Sodium 25 MCG CAPS Take by mouth daily before breakfast.    [provider]  lisinopril (ZESTRIL) 20 MG tablet lisinopril 20 mg tablet    [provider]  losartan (COZAAR) 100 MG tablet Take 1 tablet by mouth every evening.  02/19/16   [provider]  metFORMIN (GLUCOPHAGE) 1000 MG tablet Take 1,000 mg by mouth 2 (two) times daily with a meal.    [provider]  naproxen sodium (ANAPROX) 220 MG tablet Take 660 mg by mouth 2 (two) times daily as needed (pain).    [provider]  nitrofurantoin, macrocrystal-monohydrate, (MACROBID) 100 MG capsule nitrofurantoin monohydrate/macrocrystals 100 mg capsule    [provider]  omeprazole (PRILOSEC) 10 MG capsule Prilosec 10 mg capsule,delayed release  Take 2 capsules every day by oral route.    [provider]  oxyCODONE (OXY IR/ROXICODONE) 5 MG immediate release tablet oxycodone 5 mg tablet    [provider]  progesterone (PROMETRIUM) 200 MG capsule progesterone micronized 200 mg capsule  Take 1 capsule every day by oral route for 12 days.    [provider]  venlafaxine XR (EFFEXOR-XR) 75 MG 24 hr capsule Take 3 capsules by mouth every evening.  12/21/15   [provider]    Allergies    Guaifenesin, Hydrocodone, and  Latex  Review of Systems   Review of Systems  Musculoskeletal: Positive for arthralgias (Left wrist pain).  Skin: Negative for color change.  Neurological: Negative for weakness and numbness.    Physical Exam Updated Vital Signs BP 132/74 (BP Location: Right Arm)   Pulse (!) 104   Temp 98.1 F (36.7 C) (Oral)   Resp 18   Ht 5\' 2"  (1.575 m)   Wt 124.7 kg   SpO2 95%   BMI 50.30 kg/m   Physical Exam Vitals and nursing note reviewed.  Constitutional:      General: She is not in acute distress.    Appearance: Normal appearance. She is well-developed. She is not ill-appearing or diaphoretic.  HENT:     Head: Normocephalic  and atraumatic.  Eyes:     Conjunctiva/sclera: Conjunctivae normal.  Cardiovascular:     Rate and Rhythm: Normal rate and regular rhythm.     Heart sounds: No murmur heard.   Pulmonary:     Effort: Pulmonary effort is normal. No respiratory distress.     Breath sounds: Normal breath sounds.  Abdominal:     Palpations: Abdomen is soft.     Tenderness: There is no abdominal tenderness.  Musculoskeletal:        General: Tenderness present. No deformity.     Cervical back: Neck supple.     Comments: Left wrist with significant tenderness to palpation over the radial and ulnar styloids.  Left snuffbox tenderness.  Able to move all digits without difficulty, 2+ radial pulses.<2 cap refill.  No evidence of open fracture.  Grip strength diminished secondary to pain.  Gross sensations intact.  Skin:    General: Skin is warm and dry.     Capillary Refill: Capillary refill takes less than 2 seconds.  Neurological:     General: No focal deficit present.     Mental Status: She is alert and oriented to person, place, and time.     Sensory: No sensory deficit.     Motor: No weakness.  Psychiatric:        Mood and Affect: Mood normal.        Behavior: Behavior normal.     ED Results / Procedures / Treatments   Labs (all labs ordered are listed, but only  abnormal results are displayed) Labs Reviewed - No data to display  EKG None  Radiology DG Wrist Complete Left  Result Date: 10/23/2019 CLINICAL DATA:  Left wrist injury. EXAM: LEFT WRIST - COMPLETE 3+ VIEW COMPARISON:  No prior. FINDINGS: Nondisplaced fracture of the distal left radius noted. No other focal abnormality identified. No evidence of dislocation. IMPRESSION: Nondisplaced fracture of the distal left radius. Electronically Signed   By: Maisie Fus  Register   On: 10/23/2019 11:46    Procedures Procedures (including critical care time)  Medications Ordered in ED Medications  ketorolac (TORADOL) injection 60 mg (60 mg Intramuscular Given 10/23/19 1122)    ED Course  I have reviewed the triage vital signs and the nursing notes.  Pertinent labs & imaging results that were available during my care of the patient were reviewed by me and considered in my medical decision making (see chart for details).    MDM Rules/Calculators/A&P                         39 year old female with pain of her left wrist after a FOOSH Physical exam with tenderness to the radial and snuffbox tenderness.  2+ radial pulses, good cap refill.  Range of motion and strength slightly limited secondary to pain.  Plain films with nondisplaced fracture of the left distal radius.  Will place in sugar tong splint, and have her follow-up with [facility] orthopedics with whom she had her initial wrist fracture repaired with.  She will need repeat x-rays given snuffbox tenderness to rule out AVN.  Encouraged Tylenol/ibuprofen for pain.  Close follow-up with Ortho pending.  Return precautions given.  All the patient's questions have been answered to her satisfaction, she voices understanding is agreeable to this plan.  At this stage in the ED course, the patient has been adequately screened and is stable for discharge  Final Clinical Impression(s) / ED Diagnoses Final diagnoses:  Left wrist fracture, closed,  initial  encounter    Rx / DC Orders ED Discharge Orders    None       Leone BrandBelaya, Lylia Karn A, PA-C 10/23/19 1318    Pollyann SavoySheldon, Charles B, MD 10/23/19 1455

## 2019-11-29 ENCOUNTER — Encounter (HOSPITAL_COMMUNITY): Payer: Self-pay | Admitting: Emergency Medicine

## 2019-11-29 ENCOUNTER — Other Ambulatory Visit: Payer: Self-pay

## 2019-11-29 ENCOUNTER — Emergency Department (HOSPITAL_COMMUNITY): Payer: 59

## 2019-11-29 DIAGNOSIS — M25512 Pain in left shoulder: Secondary | ICD-10-CM | POA: Insufficient documentation

## 2019-11-29 DIAGNOSIS — R0789 Other chest pain: Secondary | ICD-10-CM | POA: Insufficient documentation

## 2019-11-29 DIAGNOSIS — Z5321 Procedure and treatment not carried out due to patient leaving prior to being seen by health care provider: Secondary | ICD-10-CM | POA: Insufficient documentation

## 2019-11-29 DIAGNOSIS — M79602 Pain in left arm: Secondary | ICD-10-CM | POA: Insufficient documentation

## 2019-11-29 LAB — BASIC METABOLIC PANEL
Anion gap: 13 (ref 5–15)
BUN: 6 mg/dL (ref 6–20)
CO2: 25 mmol/L (ref 22–32)
Calcium: 8.7 mg/dL — ABNORMAL LOW (ref 8.9–10.3)
Chloride: 98 mmol/L (ref 98–111)
Creatinine, Ser: 0.46 mg/dL (ref 0.44–1.00)
GFR calc Af Amer: >60 mL/min (ref >60)
GFR calc non Af Amer: >60 mL/min (ref >60)
Glucose, Bld: 148 mg/dL — ABNORMAL HIGH (ref 70–99)
Potassium: 3.6 mmol/L (ref 3.5–5.1)
Sodium: 136 mmol/L (ref 135–145)

## 2019-11-29 LAB — CBC
HCT: 44.6 % (ref 36.0–46.0)
Hemoglobin: 14.8 g/dL (ref 12.0–15.0)
MCH: 36.3 pg — ABNORMAL HIGH (ref 26.0–34.0)
MCHC: 33.2 g/dL (ref 30.0–36.0)
MCV: 109.3 fL — ABNORMAL HIGH (ref 80.0–100.0)
Platelets: 132 10*3/uL — ABNORMAL LOW (ref 150–400)
RBC: 4.08 MIL/uL (ref 3.87–5.11)
RDW: 14.4 % (ref 11.5–15.5)
WBC: 7.8 10*3/uL (ref 4.0–10.5)
nRBC: 0.3 % — ABNORMAL HIGH (ref 0.0–0.2)

## 2019-11-29 LAB — I-STAT BETA HCG BLOOD, ED (NOT ORDERABLE): I-stat hCG, quantitative: <5 m[IU]/mL (ref <5)

## 2019-11-29 LAB — TROPONIN I (HIGH SENSITIVITY): Troponin I (High Sensitivity): 2 ng/L (ref <18)

## 2019-11-29 IMAGING — CR DG CHEST 2V
2 series · 2 of 2 positions shown · non-contrast
Comparison: [DATE]

CLINICAL DATA: Chest pain

EXAM:
CHEST - 2 VIEW

[w chest pa]
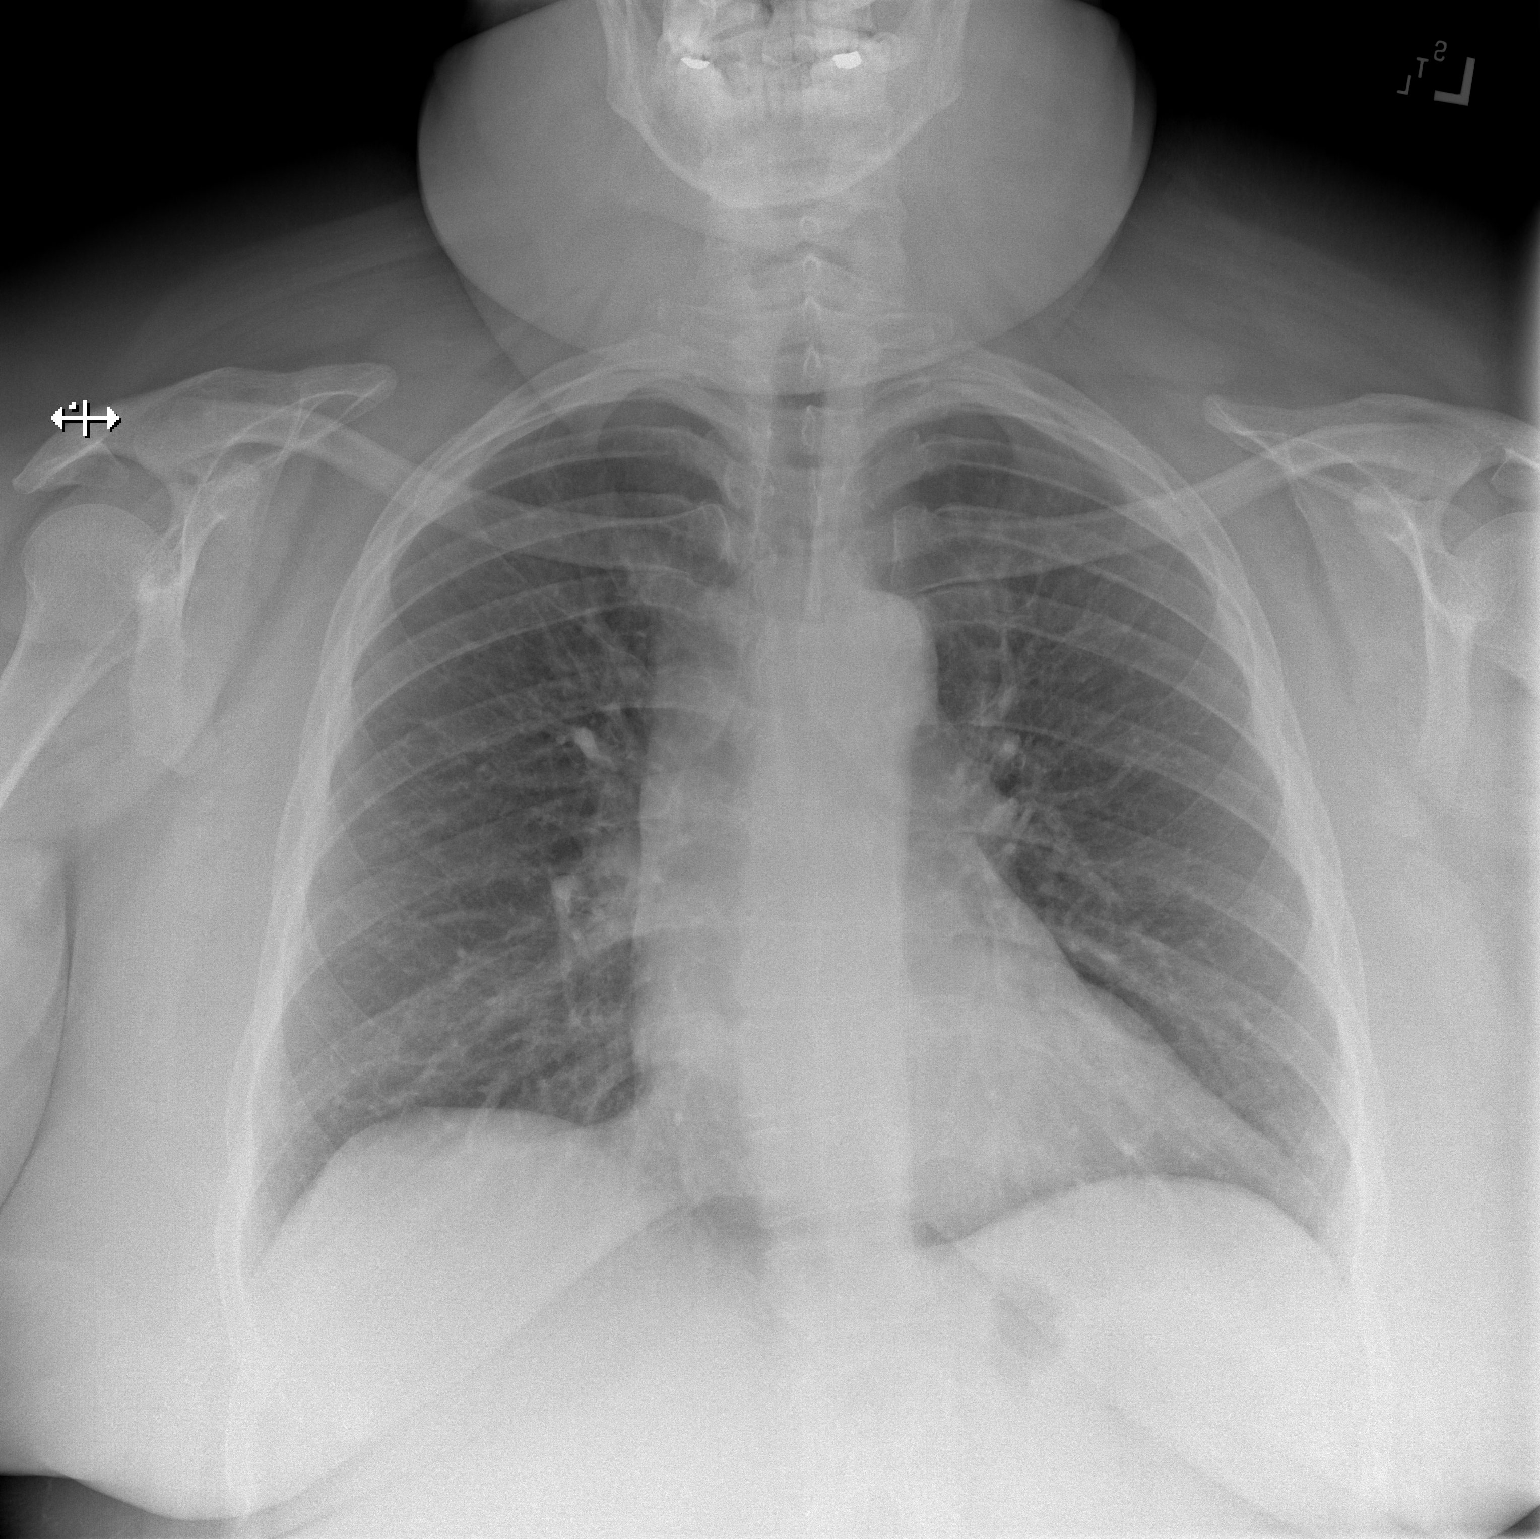

[w chest lat]
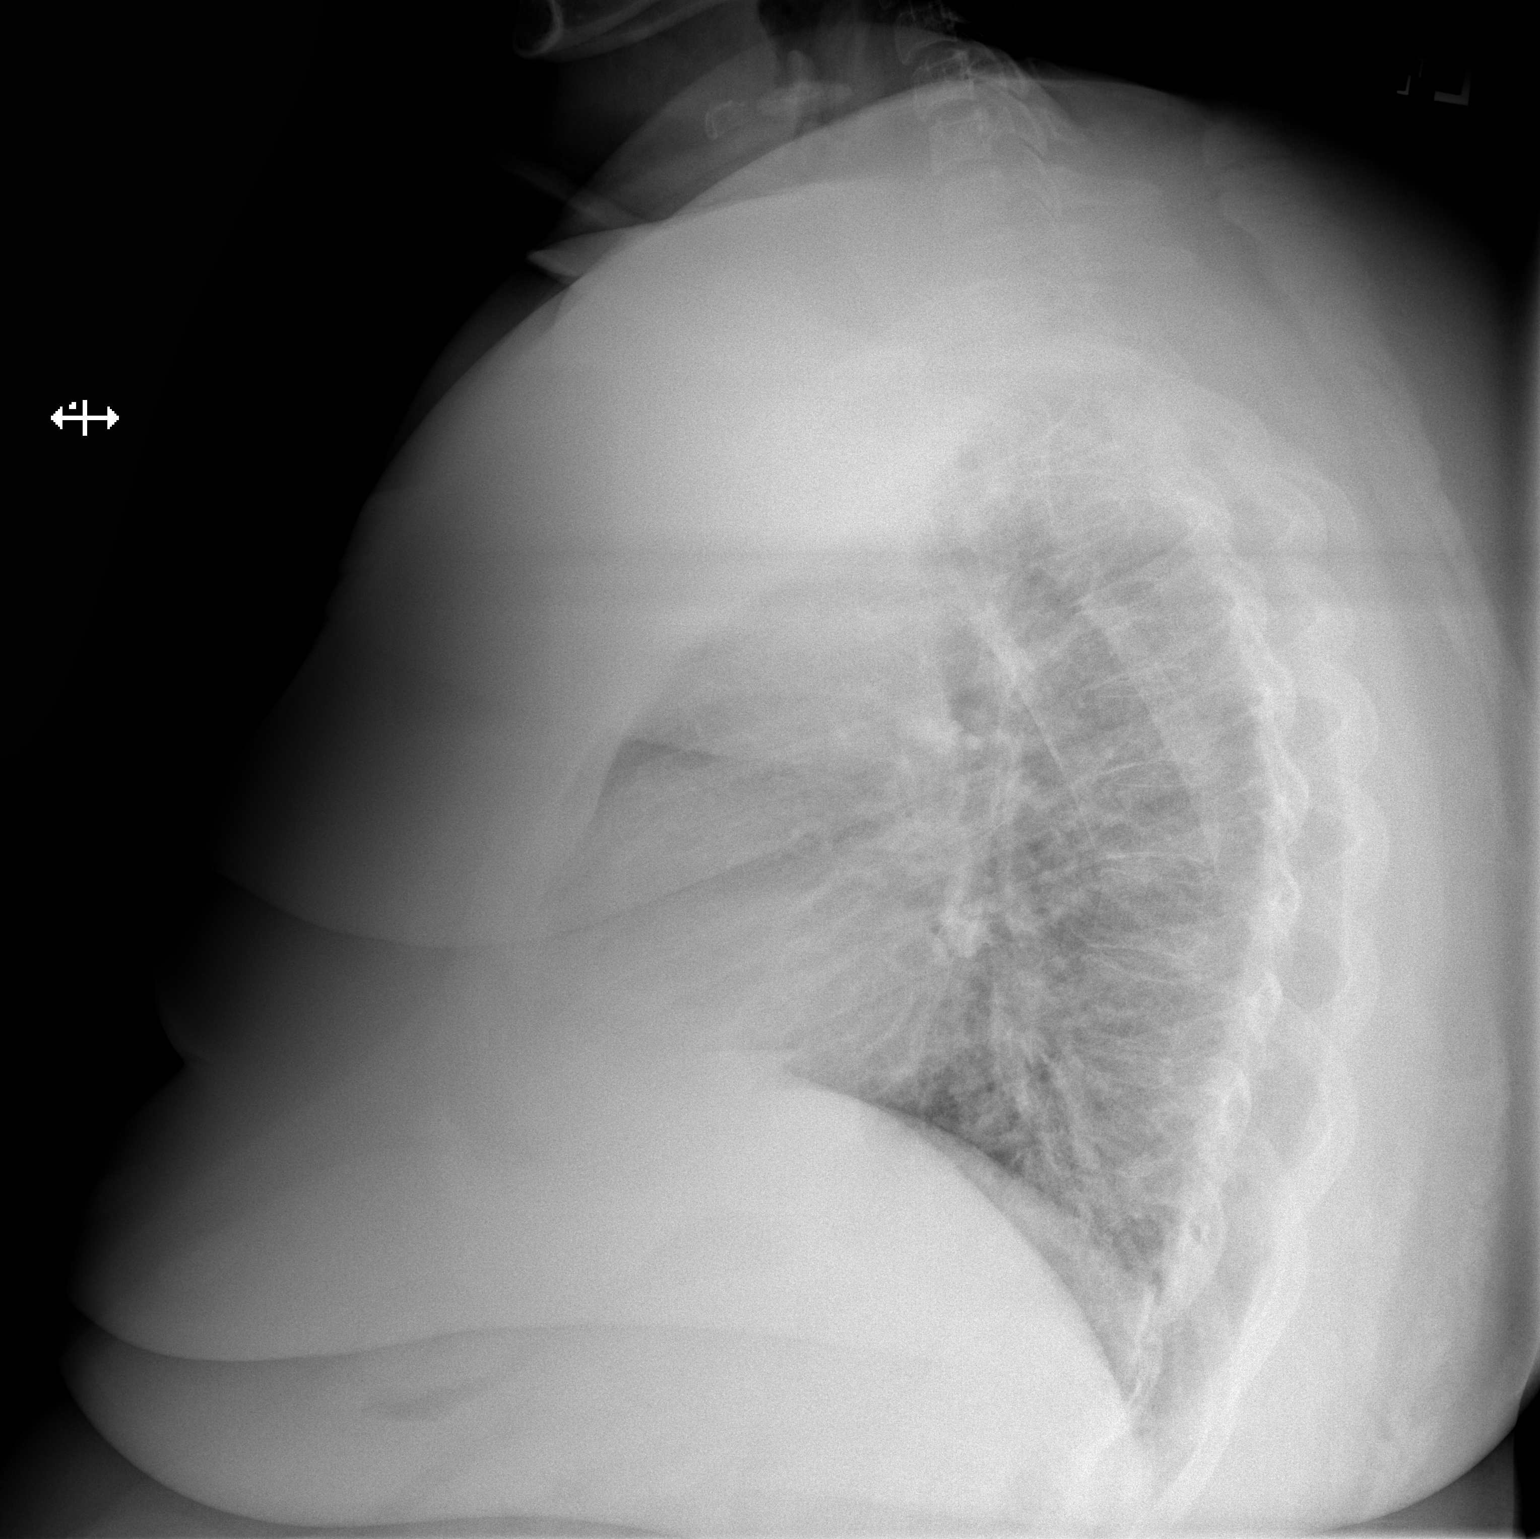

[2 of 2 positions shown; findings below may reference images not displayed]

FINDINGS: Heart and mediastinal contours are within normal limits. No focal
opacities or effusions. No acute bony abnormality.
IMPRESSION: No active cardiopulmonary disease.

## 2019-11-29 NOTE — ED Triage Notes (Signed)
Patient c/o left shoulder and arm pain radiating to upper left chest since waking this morning. Denies injury. Reports pain worsens with movement. States seen at [facility] today and given toradol shot and lidocaine patch.

## 2019-11-30 ENCOUNTER — Encounter: Payer: Self-pay | Admitting: Infectious Diseases

## 2019-11-30 ENCOUNTER — Ambulatory Visit: Payer: 59

## 2019-11-30 ENCOUNTER — Emergency Department (HOSPITAL_COMMUNITY)
Admission: EM | Admit: 2019-11-30 | Discharge: 2019-11-30 | Disposition: A | Discharge status: Home / Self Care | Payer: 59 | Attending: Emergency Medicine | Admitting: Emergency Medicine

## 2019-11-30 NOTE — ED Notes (Signed)
Pt returned her stickers to the screeners and left the ED due to wait time.

## 2019-12-01 ENCOUNTER — Emergency Department (HOSPITAL_COMMUNITY)
Admission: EM | Admit: 2019-12-01 | Discharge: 2019-12-01 | Disposition: A | Discharge status: Home / Self Care | Payer: 59 | Attending: Emergency Medicine | Admitting: Emergency Medicine

## 2019-12-01 ENCOUNTER — Encounter (HOSPITAL_COMMUNITY): Payer: Self-pay

## 2019-12-01 ENCOUNTER — Emergency Department (HOSPITAL_COMMUNITY): Payer: 59

## 2019-12-01 DIAGNOSIS — Z7984 Long term (current) use of oral hypoglycemic drugs: Secondary | ICD-10-CM | POA: Insufficient documentation

## 2019-12-01 DIAGNOSIS — E039 Hypothyroidism, unspecified: Secondary | ICD-10-CM | POA: Diagnosis not present

## 2019-12-01 DIAGNOSIS — M542 Cervicalgia: Secondary | ICD-10-CM | POA: Insufficient documentation

## 2019-12-01 DIAGNOSIS — Z87891 Personal history of nicotine dependence: Secondary | ICD-10-CM | POA: Insufficient documentation

## 2019-12-01 DIAGNOSIS — Z79890 Hormone replacement therapy (postmenopausal): Secondary | ICD-10-CM | POA: Diagnosis not present

## 2019-12-01 DIAGNOSIS — Z91040 Latex allergy status: Secondary | ICD-10-CM | POA: Insufficient documentation

## 2019-12-01 DIAGNOSIS — I10 Essential (primary) hypertension: Secondary | ICD-10-CM | POA: Insufficient documentation

## 2019-12-01 DIAGNOSIS — J45909 Unspecified asthma, uncomplicated: Secondary | ICD-10-CM | POA: Insufficient documentation

## 2019-12-01 DIAGNOSIS — Z79899 Other long term (current) drug therapy: Secondary | ICD-10-CM | POA: Diagnosis not present

## 2019-12-01 DIAGNOSIS — M25512 Pain in left shoulder: Secondary | ICD-10-CM | POA: Insufficient documentation

## 2019-12-01 IMAGING — CT CT CERVICAL SPINE W/O CM
3 of 4 series · 12 of 33 positions shown, 14 images · non-contrast
Comparison: None.

CLINICAL DATA: Left-sided radicular symptoms

EXAM:
CT CERVICAL SPINE WITHOUT CONTRAST
TECHNIQUE: Multidetector CT imaging of the cervical spine was performed without
intravenous contrast. Multiplanar CT image reconstructions were also
generated.

[Series 7: orthogonal bone · axial · 0.23mm/px · z∈[-218,-101]mm · 4 of 92 slices shown, 5 images]
[im 16/92  soft-tissue]
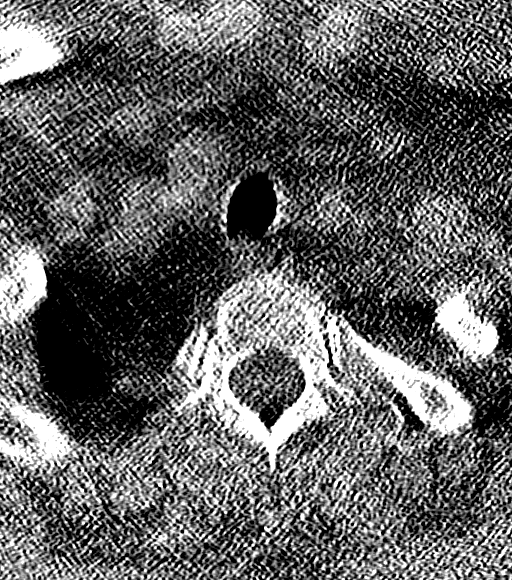
[im 16/92  bone]
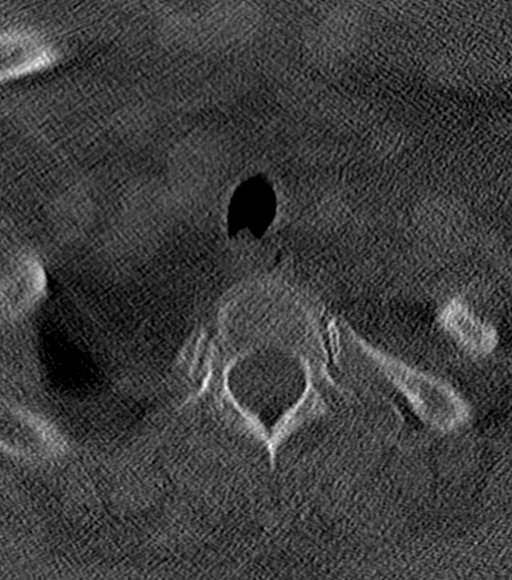
[im 31/92  bone]
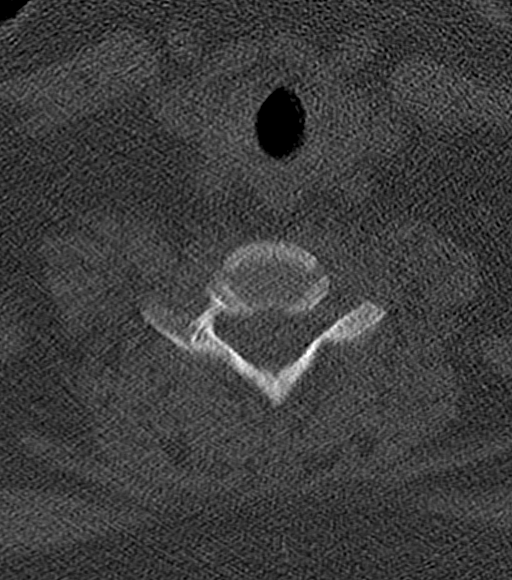
[im 61/92  bone]
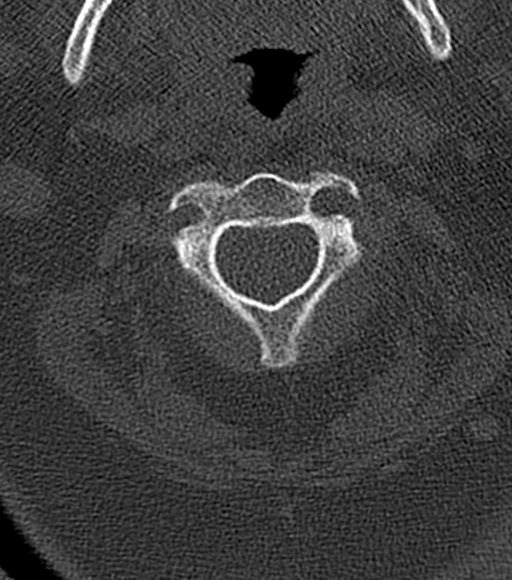
[im 76/92  bone]
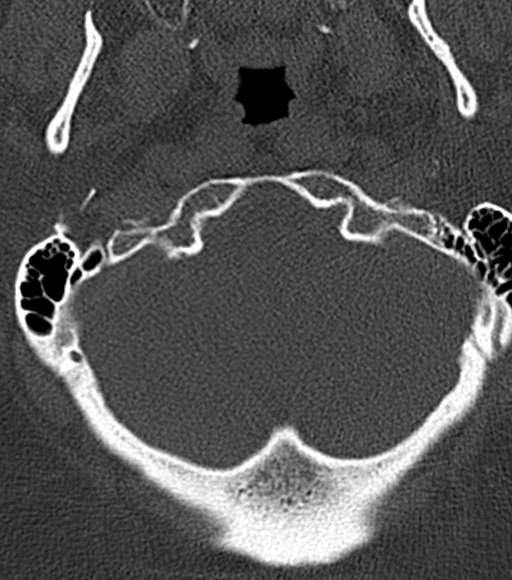

[Series 8: coronal bone · coronal · 0.24mm/px · 3 of 60 slices shown]
[im 12/60  bone]
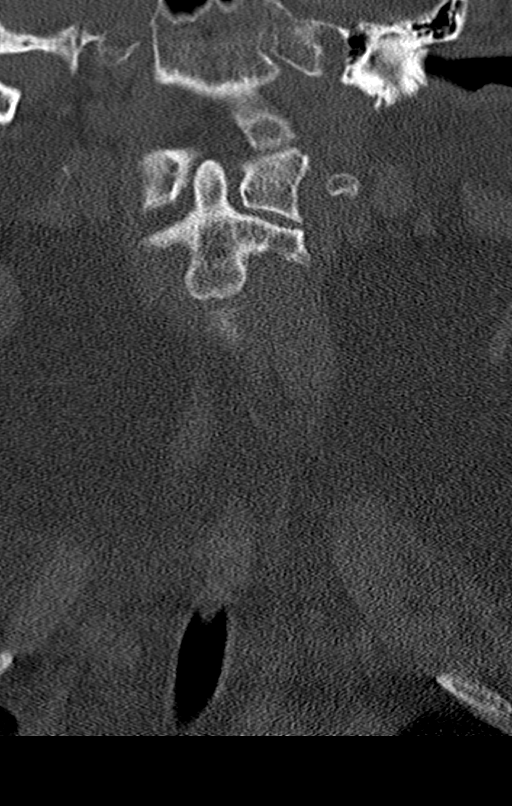
[im 24/60  bone]
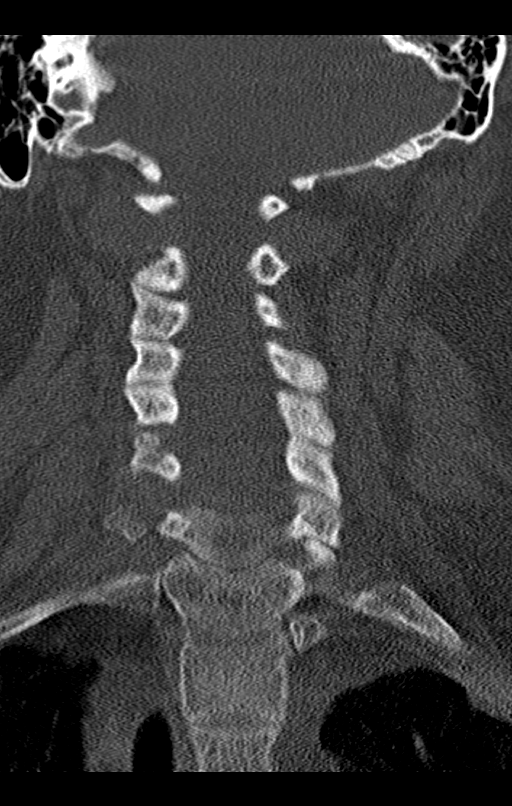
[im 36/60  bone]
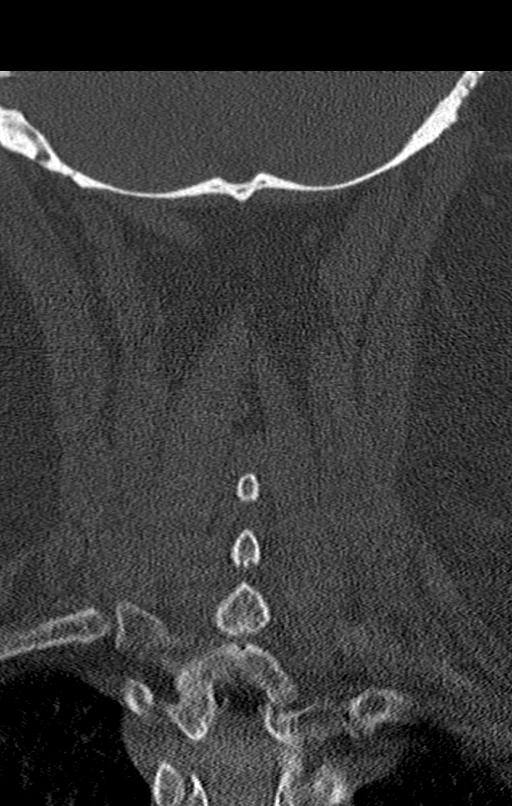

[Series 9: sagittal bone · sagittal · 0.27mm/px · 5 of 61 slices shown, 6 images]
[im 21/61  bone]
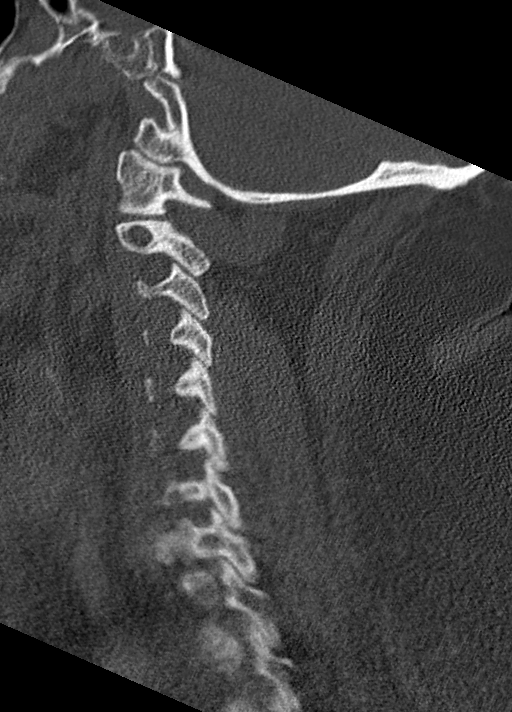
[im 26/61  bone]
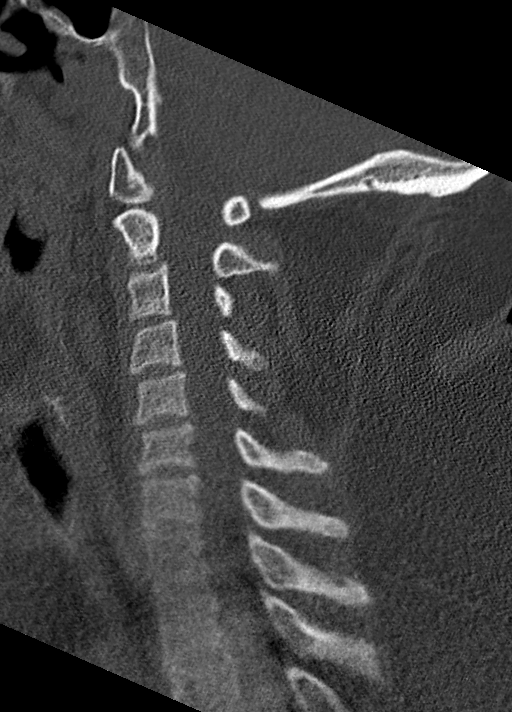
[im 31/61  soft-tissue]
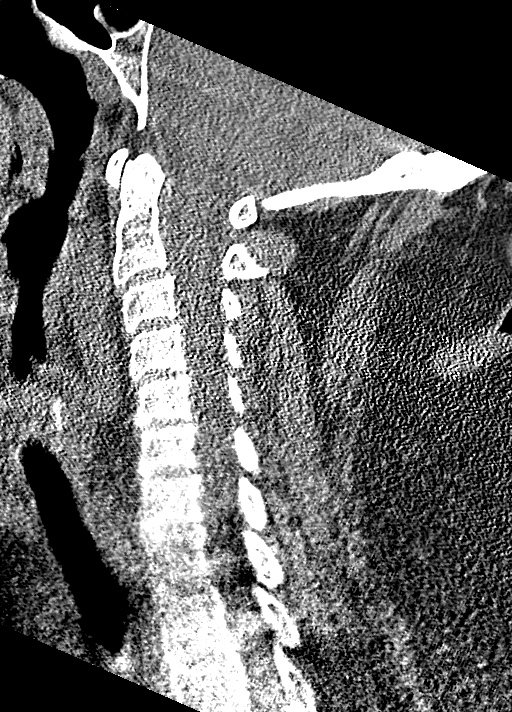
[im 31/61  bone]
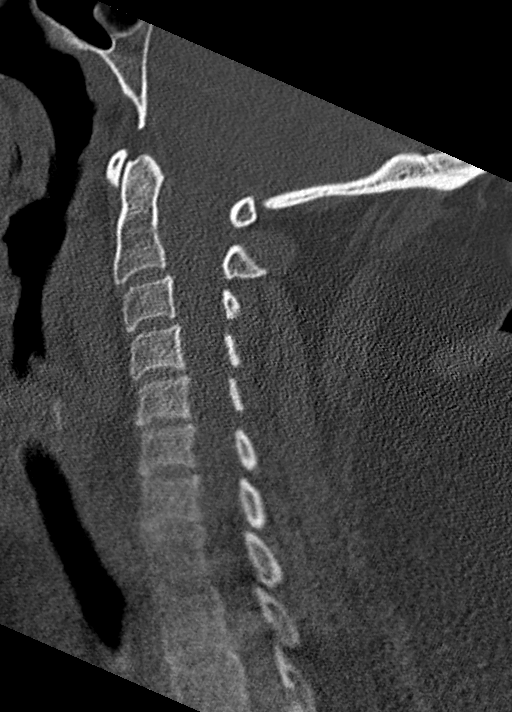
[im 36/61  bone]
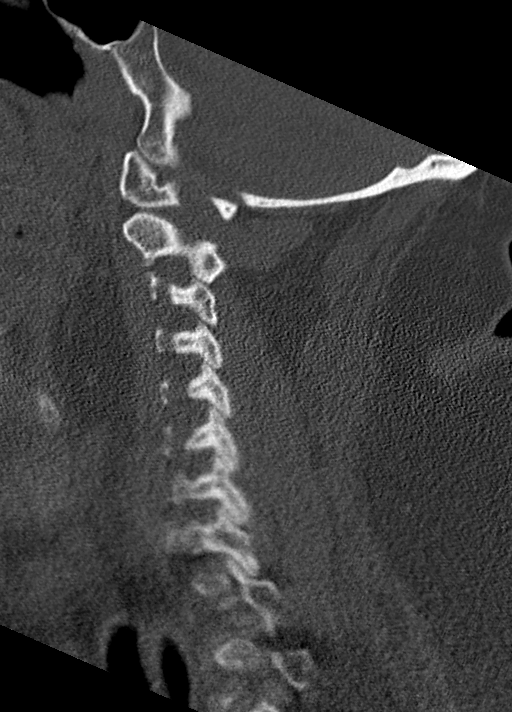
[im 41/61  bone]
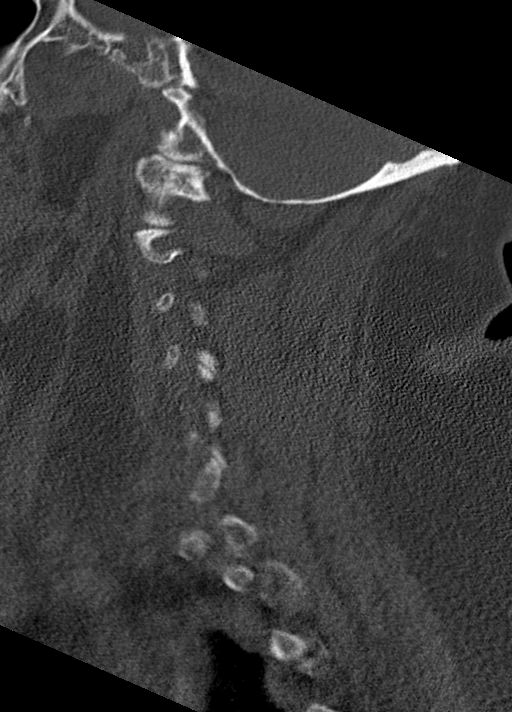

[12 of 33 positions shown; findings below may reference images not displayed]

FINDINGS: Alignment: There is no appreciable spondylolisthesis.

Skull base and vertebrae: Skull base and craniocervical junction
regions appear normal. No evident fracture. No blastic or lytic bone
lesions.

Soft tissues and spinal canal: Prevertebral soft tissues and
predental space regions are normal. There is no evident cord or
canal hematoma. There are no paraspinous lesions.

Disc levels:

At C2-3, there is no nerve root edema or effacement. No disc
extrusion or stenosis.

At C3-4, there is no appreciable nerve root edema or effacement.
There is no disc extrusion or stenosis

At C4-5, there is no appreciable nerve root edema or effacement. No
disc extrusion or stenosis.

At C5-6, there is no appreciable nerve root edema or effacement. No
disc extrusion or stenosis.

At C6-7, there is slight generalized disc bulging. No nerve root
edema or effacement. No disc extrusion or stenosis.

At C7-T1, there is slight facet hypertrophy bilaterally. No nerve
root edema or effacement. No disc extrusion or stenosis.

Upper chest: Visualized upper lung regions are clear.

Other: None
IMPRESSION: No nerve root edema or effacement. No disc extrusion or stenosis. No
fracture or spondylolisthesis. Slight generalized disc bulging C6-7.

## 2019-12-01 MED ORDER — ONDANSETRON 4 MG PO TBDP
4.0000 mg | ORAL_TABLET | Freq: Once | ORAL | Status: AC
  Administered 2019-12-01: 4 mg via ORAL
  Filled 2019-12-01: qty 1

## 2019-12-01 MED ORDER — BUPIVACAINE-EPINEPHRINE (PF) 0.25% -1:200000 IJ SOLN
30.0000 mL | Freq: Once | INTRAMUSCULAR | Status: AC
  Administered 2019-12-01: 30 mL
  Filled 2019-12-01: qty 30

## 2019-12-01 MED ORDER — OXYCODONE HCL 5 MG PO TABS
5.0000 mg | ORAL_TABLET | Freq: Once | ORAL | Status: AC
  Administered 2019-12-01: 5 mg via ORAL
  Filled 2019-12-01: qty 1

## 2019-12-01 MED ORDER — BACLOFEN 10 MG PO TABS
10.0000 mg | ORAL_TABLET | Freq: Three times a day (TID) | ORAL | 0 refills | Status: DC | PRN
Start: 2019-12-01 — End: 2020-07-11

## 2019-12-01 MED ORDER — CELECOXIB 200 MG PO CAPS
200.0000 mg | ORAL_CAPSULE | Freq: Two times a day (BID) | ORAL | 0 refills | Status: DC
Start: 2019-12-01 — End: 2020-07-18

## 2019-12-01 MED ORDER — ONDANSETRON HCL 4 MG/2ML IJ SOLN: 4.0000 mg | Freq: Once | INTRAMUSCULAR | Status: DC

## 2019-12-01 NOTE — ED Provider Notes (Signed)
[facility]-EMERGENCY DEPT Provider Note   CSN: [id number] Arrival date & time: 12/01/19  7026     History Chief Complaint  Patient presents with  . Neck Pain  . Shoulder Pain    Lindsay French is a 39 y.o. female who presents with Left shoulder and neck pain. Pain is severe, radiates to the left shoulder, scapula, and pectoralis. Sharp and aching. Worse when she lays on the shoulder and with certain movements. She has no known injuries. Present for 2 days. No numbness or weakness. No chest pain, sob, palpitations. She has taken Vicodin, advil, gabapentin, toradol, and aleve without relief. She was [facility] and has an op MRI scehduled.   HPI     Past Medical History:  Diagnosis Date  . ADHD   . Anxiety   . Asthma    usually with colds  . Depression   . Elevated LFTs   . Fatty liver 04/16/2016   Noted [place] ABD  . GERD (gastroesophageal reflux disease)   . Heart murmur    at birth, no issues  . History of heat stroke    with syncope  . History of thrombocytopenia    prior to diagnosis HIV  . HIV (human immunodeficiency virus infection) (HCC)   . Hyperlipidemia   . Hypertension   . Hypothyroidism   . Obese   . PCOS (polycystic ovarian syndrome)   . Pneumonia 04/17/2016  . Seasonal allergies   . Wears glasses   . Wears partial dentures    upper  . Wrist fracture 11/2017   Left    Patient Active Problem List   Diagnosis Date Noted  . Back pain 03/24/2019  . Infertility, female 03/24/2019  . PCOS (polycystic ovarian syndrome) 03/24/2019  . Human immunodeficiency virus (HIV) disease (HCC) 04/24/2018  . Health care maintenance 04/24/2018  . Hyperlipidemia 04/10/2017  . Community acquired pneumonia of right upper lobe of lung 04/16/2016  . Acute respiratory failure with hypoxia (HCC) 04/16/2016  . Acute bronchitis 04/16/2016  . Hypertension 04/16/2016  . Obesity 03/21/2016  . Screening examination for venereal disease  03/30/2014  . Encounter for long-term (current) use of medications 03/30/2014  . Idiopathic thrombocytopenic purpura (ITP) (HCC) 11/14/2010  . ADHD (attention deficit hyperactivity disorder) 11/14/2010    Past Surgical History:  Procedure Laterality Date  . HYSTEROSCOPY N/A 12/31/2017   Procedure: HYSTEROSCOPY and polypectomy;  Surgeon: Fermin Schwab, MD;  Location: [facility];  Service: Gynecology;  Laterality: N/A;  . maxillofacial surgery       OB History   No obstetric history on file.     Family History  Problem Relation Age of Onset  . Hypothyroidism Mother   . Hypertension Father     Social History   Tobacco Use  . Smoking status: Former Smoker    Packs/day: 0.30    Years: 15.00    Pack years: 4.50    Types: Cigarettes  . Smokeless tobacco: Never Used  Vaping Use  . Vaping Use: Never used  Substance Use Topics  . Alcohol use: Yes    Alcohol/week: 7.0 standard drinks    Types: 7 Glasses of wine per week    Comment: wine  2-3 times a week  . Drug use: Not Currently    Types: "Crack" cocaine    Comment: Previous hx of crack use - clean for 7 years     Home Medications Prior to Admission medications   Medication Sig Start Date  End Date Taking? Authorizing Provider  albuterol (ACCUNEB) 0.63 MG/3ML nebulizer solution albuterol sulfate    [provider]  ALPRAZolam (XANAX) 0.5 MG tablet Take 1 tablet by mouth 2 (two) times daily as needed for anxiety.  12/21/15   [provider]  Chorionic Gonadotropin (NOVAREL) 5000 units SOLR Novarel 5,000 unit intramuscular solution  MIX WITH 1CC OF DILUENT AS DIRECTED. INJECT DOSE SUB- CUTANEOUSLY AT EXACT TIME & DATE INSTRUCTED BY YOUR MD.    [provider]  citalopram (CELEXA) 20 MG tablet citalopram 20 mg tablet    [provider]  diphenhydramine-acetaminophen (TYLENOL PM) 25-500 MG TABS tablet Take 2 tablets by mouth at bedtime as needed (sleep).    [provider]  fexofenadine-pseudoephedrine (ALLEGRA-D) 60-120 MG 12 hr tablet Take 1 tablet by mouth daily as needed (allergies).    [provider]  HYDROMET 5-1.5 MG/5ML syrup Take 5 mLs by mouth every 6 (six) hours as needed for cough. 04/11/16   [provider]  irbesartan (AVAPRO) 150 MG tablet Take 150 mg by mouth daily. 02/06/19   [provider]  letrozole (FEMARA) 2.5 MG tablet letrozole 2.5 mg tablet  Take 3 tablet(s) by oral route on days 3-7 of cycle.    [provider]  Levothyroxine Sodium 25 MCG CAPS Take by mouth daily before breakfast.    [provider]  lisinopril (ZESTRIL) 20 MG tablet lisinopril 20 mg tablet    [provider]  losartan (COZAAR) 100 MG tablet Take 1 tablet by mouth every evening.  02/19/16   [provider]  metFORMIN (GLUCOPHAGE) 1000 MG tablet Take 1,000 mg by mouth 2 (two) times daily with a meal.    [provider]  naproxen sodium (ANAPROX) 220 MG tablet Take 660 mg by mouth 2 (two) times daily as needed (pain).    [provider]  nitrofurantoin, macrocrystal-monohydrate, (MACROBID) 100 MG capsule nitrofurantoin monohydrate/macrocrystals 100 mg capsule    [provider]  omeprazole (PRILOSEC) 10 MG capsule Prilosec 10 mg capsule,delayed release  Take 2 capsules every day by oral route.    [provider]  oxyCODONE (OXY IR/ROXICODONE) 5 MG immediate release tablet oxycodone 5 mg tablet    [provider]  progesterone (PROMETRIUM) 200 MG capsule progesterone micronized 200 mg capsule  Take 1 capsule every day by oral route for 12 days.    [provider]  venlafaxine XR (EFFEXOR-XR) 75 MG 24 hr capsule Take 3 capsules by mouth every evening.  12/21/15   [provider]    Allergies    Guaifenesin, Hydrocodone, and Latex  Review of Systems   Review of Systems Ten systems reviewed and are negative for acute change, except as  noted in the HPI.   Physical Exam Updated Vital Signs BP 120/85 (BP Location: Right Arm)   Pulse (!) 117   Temp 98.3 F (36.8 C) (Oral)   Resp 20   Ht 5\' 2"  (1.575 m)   Wt 121.6 kg   SpO2 98%   BMI 49.02 kg/m   Physical Exam Vitals and nursing note reviewed.  Constitutional:      General: She is not in acute distress.    Appearance: She is well-developed. She is not diaphoretic.     Comments: Tearful hyperventilating  HENT:     Head: Normocephalic and atraumatic.  Eyes:     General: No scleral icterus.    Conjunctiva/sclera: Conjunctivae normal.  Cardiovascular:     Rate and Rhythm:  Normal rate and regular rhythm.     Heart sounds: Normal heart sounds. No murmur heard.  No friction rub. No gallop.   Pulmonary:     Effort: Pulmonary effort is normal. No respiratory distress.     Breath sounds: Normal breath sounds.  Abdominal:     General: Bowel sounds are normal. There is no distension.     Palpations: Abdomen is soft. There is no mass.     Tenderness: There is no abdominal tenderness. There is no guarding.  Musculoskeletal:     Right shoulder: Normal.       Arms:     Cervical back: Normal range of motion.       Back:     Comments: Left shoulder girdle pain.  Pain is worse when left shoulder is at rest next to her body.  Better when the left arm is supported.  She has severe spasm in the left SCM, anterior chest wall, scalenes, left trapezius.  She has full range of motion of her neck. Normal and equal grip strength bilaterally . Range of motion of the left shoulder is severely limited due to pain.  Neurovascularly intact.  Skin:    General: Skin is warm and dry.  Neurological:     Mental Status: She is alert and oriented to person, place, and time.  Psychiatric:        Behavior: Behavior normal.     ED Results / Procedures / Treatments   Labs (all labs ordered are listed, but only abnormal results are displayed) Labs Reviewed - No data to  display  EKG None  Radiology DG Chest 2 View  Result Date: 11/29/2019 CLINICAL DATA:  Chest pain EXAM: CHEST - 2 VIEW COMPARISON:  04/16/2016 FINDINGS: Heart and mediastinal contours are within normal limits. No focal opacities or effusions. No acute bony abnormality. IMPRESSION: No active cardiopulmonary disease. Electronically Signed   By: Charlett NoseKevin  Dover M.D.   On: 11/29/2019 23:00    Procedures Procedures (including critical care time) Trigger Point Injection Date/Time:12/01/2019 9:03 AM Performed by: Arthor CaptainHARRIS, Sequoya Hogsett Authorized by: Arthor CaptainHARRIS, Kimyetta Flott Consent: Verbal consent obtained. Risks and benefits: risks, benefits and alternatives were discussed Consent given by: patient Patient identity confirmed: provided patient demograhics Preparation: Patient was prepped and draped in the usual sterile fashion. Local anesthesia used: yes Anesthesia: local infiltration Local anesthetic: Marcaine 0.25% with Epi  Anesthetic total: 10 mL Patient tolerance: Patient tolerated the procedure well with no immediate complications   Medications Ordered in ED Medications - No data to display  ED Course  I have reviewed the triage vital signs and the nursing notes.  Pertinent labs & imaging results that were available during my care of the patient were reviewed by me and considered in my medical decision making (see chart for details).    MDM Rules/Calculators/A&P                          39 year old female here with left shoulder girdle pain.  I think this is all secondary to significant spasm.  Patient given trigger point injection which did significant reduce her pain.  She was given oxycodone which did not help her pain.  I ordered and reviewed CT images of the patient's C-spine which shows no significant stenosis or arthritic changes.  I do not believe that her pain is secondary to radiculopathy.  Patient will be discharged with time for amatory and muscle relaxers.  Close outpatient follow-up  with her  PCP.  Recommend referral to physical therapy. Final Clinical Impression(s) / ED Diagnoses Final diagnoses:  None    Rx / DC Orders ED Discharge Orders    None       Arthor Captain, PA-C 12/01/19 1442    Geoffery Lyons, MD 12/02/19 [phone]

## 2019-12-01 NOTE — ED Triage Notes (Addendum)
Per EMS, Pt c/o L neck and L shoulder pain radiating in to L chest x 2 days.  Pain score 10/10.  Denies injury.  Pt has been seen several times at several different places for same.  [facility] and an outpatient MRI was ordered.  Pt has taken Neurontin, oxycodone, metaxalone, and Vicodin w/o relief.  Limited ROM noted.

## 2019-12-01 NOTE — Discharge Instructions (Addendum)
Your CT scan of the neck showed no acute or significant abnormalities.  You do not have any significant evidence of degenerative changes or spinal stenosis.  You have very mild bulging disks.  You may review the results on your my chart account.  I do not think that these are the cause of your left shoulder pain.  Please follow-up with your primary care physician as they can refer you to physical therapy which may give you the most relief.  I am sending him with some pain medication and muscle relaxers.  Please follow closely with your primary care physician. Return immediately if you develop sudden weakness in the left arm.  Otherwise  Contact a health care provider if: Your cramps or spasms get more severe or happen more often. Your cramps or spasms do not improve over time.

## 2019-12-01 NOTE — ED Notes (Signed)
Pt reports she fell a couple weeks ago and injured her wrist.  Sts the UC provider thinks she fractured her clavicle as well.  Reports increasing difficulty lifting L arm.    Pt is unsure if she last took her pain medication at midnight or 0200.

## 2019-12-02 ENCOUNTER — Other Ambulatory Visit: Payer: Self-pay | Admitting: Chiropractic Medicine

## 2019-12-02 DIAGNOSIS — M25512 Pain in left shoulder: Secondary | ICD-10-CM

## 2019-12-02 DIAGNOSIS — M542 Cervicalgia: Secondary | ICD-10-CM

## 2019-12-21 ENCOUNTER — Other Ambulatory Visit: Payer: Self-pay

## 2019-12-21 DIAGNOSIS — B20 Human immunodeficiency virus [HIV] disease: Secondary | ICD-10-CM

## 2019-12-21 MED ORDER — DOVATO 50-300 MG PO TABS: 1.0000 | ORAL_TABLET | Freq: Every day | ORAL | 0 refills | Status: DC

## 2019-12-23 ENCOUNTER — Telehealth: Payer: Self-pay

## 2019-12-23 NOTE — Telephone Encounter (Signed)
PA initiated for [name] via [employer]. Clinicals faxed to [facility].  Will to continue to follow determination response. Valarie Cones

## 2019-12-25 ENCOUNTER — Other Ambulatory Visit: Payer: Self-pay

## 2019-12-25 DIAGNOSIS — B20 Human immunodeficiency virus [HIV] disease: Secondary | ICD-10-CM

## 2019-12-25 DIAGNOSIS — Z79899 Other long term (current) drug therapy: Secondary | ICD-10-CM

## 2019-12-25 DIAGNOSIS — Z113 Encounter for screening for infections with a predominantly sexual mode of transmission: Secondary | ICD-10-CM

## 2019-12-26 ENCOUNTER — Other Ambulatory Visit: Payer: 59

## 2019-12-31 ENCOUNTER — Other Ambulatory Visit: Payer: 59

## 2020-01-13 ENCOUNTER — Ambulatory Visit: Payer: 59 | Admitting: Infectious Diseases

## 2020-01-28 ENCOUNTER — Other Ambulatory Visit: Payer: Self-pay | Admitting: Infectious Diseases

## 2020-01-28 DIAGNOSIS — B20 Human immunodeficiency virus [HIV] disease: Secondary | ICD-10-CM

## 2020-02-02 ENCOUNTER — Ambulatory Visit: Payer: 59 | Admitting: Infectious Diseases

## 2020-03-02 ENCOUNTER — Other Ambulatory Visit: Payer: Self-pay | Admitting: Infectious Diseases

## 2020-03-02 DIAGNOSIS — B20 Human immunodeficiency virus [HIV] disease: Secondary | ICD-10-CM

## 2020-03-10 ENCOUNTER — Ambulatory Visit: Payer: 59 | Admitting: Infectious Diseases

## 2020-03-10 ENCOUNTER — Other Ambulatory Visit: Payer: 59

## 2020-03-10 ENCOUNTER — Other Ambulatory Visit: Payer: Self-pay

## 2020-03-10 DIAGNOSIS — Z113 Encounter for screening for infections with a predominantly sexual mode of transmission: Secondary | ICD-10-CM

## 2020-03-10 DIAGNOSIS — Z79899 Other long term (current) drug therapy: Secondary | ICD-10-CM

## 2020-03-10 DIAGNOSIS — B20 Human immunodeficiency virus [HIV] disease: Secondary | ICD-10-CM

## 2020-03-11 LAB — T-HELPER CELL (CD4) - (RCID CLINIC ONLY)
CD4 % Helper T Cell: 37 % (ref 33–65)
CD4 T Cell Abs: 545 /uL (ref 400–1790)

## 2020-03-12 LAB — RPR: RPR Ser Ql: NONREACTIVE

## 2020-03-12 LAB — COMPLETE METABOLIC PANEL WITH GFR
AG Ratio: 1.3 (calc) (ref 1.0–2.5)
ALT: 55 U/L — ABNORMAL HIGH (ref 6–29)
AST: 72 U/L — ABNORMAL HIGH (ref 10–30)
Albumin: 4 g/dL (ref 3.6–5.1)
Alkaline phosphatase (APISO): 120 U/L (ref 31–125)
BUN/Creatinine Ratio: 10 (calc) (ref 6–22)
BUN: 6 mg/dL — ABNORMAL LOW (ref 7–25)
CO2: 27 mmol/L (ref 20–32)
Calcium: 9.4 mg/dL (ref 8.6–10.2)
Chloride: 96 mmol/L — ABNORMAL LOW (ref 98–110)
Creat: 0.59 mg/dL (ref 0.50–1.10)
GFR, Est African American: 135 mL/min/{1.73_m2} (ref >=60)
GFR, Est Non African American: 116 mL/min/{1.73_m2} (ref >=60)
Globulin: 3 g/dL (calc) (ref 1.9–3.7)
Glucose, Bld: 115 mg/dL — ABNORMAL HIGH (ref 65–99)
Potassium: 3.8 mmol/L (ref 3.5–5.3)
Sodium: 135 mmol/L (ref 135–146)
Total Bilirubin: 1.5 mg/dL — ABNORMAL HIGH (ref 0.2–1.2)
Total Protein: 7 g/dL (ref 6.1–8.1)

## 2020-03-12 LAB — CBC WITH DIFFERENTIAL/PLATELET
Absolute Monocytes: 845 cells/uL (ref 200–950)
Basophils Absolute: 8 cells/uL (ref 0–200)
Basophils Relative: 0.1 %
Eosinophils Absolute: 63 cells/uL (ref 15–500)
Eosinophils Relative: 0.8 %
HCT: 44 % (ref 35.0–45.0)
Hemoglobin: 15.3 g/dL (ref 11.7–15.5)
Lymphs Abs: 1801 cells/uL (ref 850–3900)
MCH: 36.6 pg — ABNORMAL HIGH (ref 27.0–33.0)
MCHC: 34.8 g/dL (ref 32.0–36.0)
MCV: 105.3 fL — ABNORMAL HIGH (ref 80.0–100.0)
MPV: 11.2 fL (ref 7.5–12.5)
Monocytes Relative: 10.7 %
Neutro Abs: 5182 cells/uL (ref 1500–7800)
Neutrophils Relative %: 65.6 %
Platelets: 203 10*3/uL (ref 140–400)
RBC: 4.18 10*6/uL (ref 3.80–5.10)
RDW: 12.5 % (ref 11.0–15.0)
Total Lymphocyte: 22.8 %
WBC: 7.9 10*3/uL (ref 3.8–10.8)

## 2020-03-12 LAB — LIPID PANEL
Cholesterol: 174 mg/dL (ref <200)
HDL: 38 mg/dL — ABNORMAL LOW (ref >=50)
LDL Cholesterol (Calc): 114 mg/dL (calc) — ABNORMAL HIGH
Non-HDL Cholesterol (Calc): 136 mg/dL (calc) — ABNORMAL HIGH (ref <130)
Total CHOL/HDL Ratio: 4.6 (calc) (ref <5.0)
Triglycerides: 116 mg/dL (ref <150)

## 2020-03-12 LAB — HIV-1 RNA QUANT-NO REFLEX-BLD
HIV 1 RNA Quant: <20 Copies/mL
HIV-1 RNA Quant, Log: <1.3 Log cps/mL

## 2020-03-29 ENCOUNTER — Encounter: Payer: Self-pay | Admitting: Infectious Diseases

## 2020-03-29 ENCOUNTER — Other Ambulatory Visit: Payer: Self-pay

## 2020-03-29 ENCOUNTER — Telehealth (INDEPENDENT_AMBULATORY_CARE_PROVIDER_SITE_OTHER): Payer: 59 | Admitting: Infectious Diseases

## 2020-03-29 DIAGNOSIS — Z113 Encounter for screening for infections with a predominantly sexual mode of transmission: Secondary | ICD-10-CM | POA: Diagnosis not present

## 2020-03-29 DIAGNOSIS — Z79899 Other long term (current) drug therapy: Secondary | ICD-10-CM

## 2020-03-29 DIAGNOSIS — B20 Human immunodeficiency virus [HIV] disease: Secondary | ICD-10-CM | POA: Diagnosis not present

## 2020-03-29 NOTE — Progress Notes (Signed)
   Subjective:    Patient ID: Lindsay French, female    DOB: 1981/03/22, 39 y.o.   MRN: [id number]  HPI (phone visit) 39 yo F with HIV+ (dx 2008) prev on atripla -->truvada/prezcobix ---> complera--> odefsy.   Has been feeling well, back pain improved. Has lost 35#.  Had an issue with insurance and missed 3.5 weeks of ART. Cost > $600 to fix. Has been reimbursed.  Usually $4/month.   Works in a daycare. Is working on getting a grant for this.   Needs booster. Wears mask in public.   No PAP since 2018. Defers one currently.   HIV 1 RNA Quant  Date Value  03/10/2020 <20 Copies/mL  03/04/2019 38 copies/mL (H)  04/24/2018 25 copies/mL (H)   CD4 T Cell Abs (/uL)  Date Value  03/10/2020 545  03/04/2019 773  04/24/2018 640     Review of Systems  Constitutional: Negative for activity change, appetite change, fatigue and unexpected weight change.  Gastrointestinal: Negative for constipation and diarrhea.  Genitourinary: Negative for difficulty urinating.       Objective:   Physical Exam  1. Due to the national emergency this service was provided using telemedicine. phone.  2. Consent from the patient for the telehealth visit and that you identified patient (by nursing)  3. Your locations, Pt and Provider- in her car, [facility].   4. Chief complaint for visit- HIV f/u  5. Document anyone else on the call- none  6. If the visit was a phone call, that you include the time you spent on the call. 11 minutes.          Assessment & Plan:

## 2020-03-29 NOTE — Assessment & Plan Note (Signed)
She is doing well I encouraged her to let us know if she has med lapses, we can help her with rx.  Is being very diligent at work about COVID restrictions.  I will see her back in 9 months.  Labs prior.

## 2020-04-20 NOTE — Telephone Encounter (Signed)
PA Approved 12/26/19-12/25/20 PA # [id number]

## 2020-04-24 ENCOUNTER — Other Ambulatory Visit: Payer: Self-pay | Admitting: Infectious Diseases

## 2020-04-24 DIAGNOSIS — B20 Human immunodeficiency virus [HIV] disease: Secondary | ICD-10-CM

## 2020-06-01 ENCOUNTER — Telehealth: Payer: Self-pay

## 2020-06-01 NOTE — Telephone Encounter (Signed)
[facility] Patient Advocate Encounter   Received notification from [employer] that prior authorization for [name] is required.   PA submitted on 06/01/20 Key [id number] Status is pending    [facility] will continue to follow.   Clearance Coots, CPhT Specialty Pharmacy Patient [facility] Phone: [phone] Fax:  [phone]

## 2020-06-02 ENCOUNTER — Telehealth: Payer: Self-pay

## 2020-06-02 NOTE — Telephone Encounter (Signed)
[facility] Patient Advocate Encounter  Prior Authorization for [name] has been approved.    PA# [id number] Effective dates: 06/01/20 through 05/31/21  Patients co-pay is $25070.00.   I was able to get patient a Co-Pay Coupon Card to make co-pay $0.00     [facility] will continue to follow.  Clearance Coots, CPhT Specialty Pharmacy Patient [facility] Phone: [phone] Fax:  [phone]

## 2020-07-01 ENCOUNTER — Other Ambulatory Visit: Payer: Self-pay | Admitting: Infectious Diseases

## 2020-07-01 DIAGNOSIS — B20 Human immunodeficiency virus [HIV] disease: Secondary | ICD-10-CM

## 2020-07-05 ENCOUNTER — Other Ambulatory Visit: Payer: Self-pay | Admitting: Infectious Diseases

## 2020-07-05 DIAGNOSIS — L03119 Cellulitis of unspecified part of limb: Secondary | ICD-10-CM

## 2020-07-05 MED ORDER — CEPHALEXIN 500 MG PO CAPS: 500.0000 mg | ORAL_CAPSULE | Freq: Four times a day (QID) | ORAL | 1 refills | Status: DC

## 2020-07-07 ENCOUNTER — Ambulatory Visit (INDEPENDENT_AMBULATORY_CARE_PROVIDER_SITE_OTHER): Payer: 59 | Admitting: Infectious Diseases

## 2020-07-07 ENCOUNTER — Encounter: Payer: Self-pay | Admitting: Infectious Diseases

## 2020-07-07 ENCOUNTER — Other Ambulatory Visit: Payer: Self-pay | Admitting: Infectious Diseases

## 2020-07-07 ENCOUNTER — Other Ambulatory Visit: Payer: Self-pay

## 2020-07-07 DIAGNOSIS — B20 Human immunodeficiency virus [HIV] disease: Secondary | ICD-10-CM | POA: Diagnosis not present

## 2020-07-07 DIAGNOSIS — E669 Obesity, unspecified: Secondary | ICD-10-CM | POA: Diagnosis not present

## 2020-07-07 DIAGNOSIS — M8700 Idiopathic aseptic necrosis of unspecified bone: Secondary | ICD-10-CM | POA: Diagnosis not present

## 2020-07-07 DIAGNOSIS — Z6839 Body mass index (BMI) 39.0-39.9, adult: Secondary | ICD-10-CM | POA: Diagnosis not present

## 2020-07-07 NOTE — Progress Notes (Signed)
   Subjective:    Patient ID: Lindsay French, female  DOB: 09-12-1980, 40 y.o.        MRN: [id number]   HPI 40 y.o F with HIV+ (dx 2008) prev on atripla -->truvada/prezcobix --->complera-->odefsy.  She has had LLE pain since November.  I got a my chart message from [name] this week that she had AVN (MRI [place]) but also had erythema over her L shin. She has been seen by [facility] and [facility].  I started her on keflex. 07-05-20 She has significant pain. Taking Morphine tid and oxycodone tid.  MRI: Geographic signal abnormality in the proximal tibia and distal fibular (partially imaged). Findings are similar nonspecific but most consistent with severe avascular necrosis. However, given degree of surrounding edema and periosteal reaction, unable to exclude infection or marrow infiltrative process (such as leukemia/lymphoma). Recommend correlation with laboratory data and any systemic sources predisposing patient to AVN (such as sickle cell disease). Depending on these findings, recommend short interval follow-up MRI which also includes the femur.   Had COVID end of January. Back to baseline.   HIV 1 RNA Quant  Date Value  03/10/2020 <20 Copies/mL  03/04/2019 38 copies/mL (H)  04/24/2018 25 copies/mL (H)   CD4 T Cell Abs (/uL)  Date Value  03/10/2020 545  03/04/2019 773  04/24/2018 640     Health Maintenance  Topic Date Due  . TETANUS/TDAP  Never done  . PAP SMEAR-Modifier  12/21/2014  . INFLUENZA VACCINE  11/22/2019  . COVID-19 Vaccine (3 - Moderna risk 4-dose series) 12/19/2019  . Hepatitis C Screening  Completed  . HIV Screening  Completed  . HPV VACCINES  Aged Out      Review of Systems  Constitutional: Positive for weight loss (40# down). Negative for chills and fever.  Respiratory: Negative for cough and shortness of breath.   Gastrointestinal: Positive for constipation (miralax, stool softener daily). Negative for diarrhea.  Genitourinary: Negative for  dysuria.  Musculoskeletal: Positive for myalgias.    Please see HPI. All other systems reviewed and negative.     Objective:  Physical Exam Vitals reviewed.  Constitutional:      Appearance: Normal appearance. She is obese.  HENT:     Mouth/Throat:     Mouth: Mucous membranes are moist.     Pharynx: No oropharyngeal exudate.  Eyes:     Extraocular Movements: Extraocular movements intact.     Pupils: Pupils are equal, round, and reactive to light.  Cardiovascular:     Rate and Rhythm: Normal rate and regular rhythm.  Pulmonary:     Effort: Pulmonary effort is normal.     Breath sounds: Normal breath sounds.  Abdominal:     General: Bowel sounds are normal. There is no distension.     Palpations: Abdomen is soft.     Tenderness: There is no abdominal tenderness.  Musculoskeletal:     Cervical back: Normal range of motion and neck supple.     Right lower leg: No edema.     Left lower leg: Edema present.  Skin:    Findings: Erythema and rash present.       Neurological:     Mental Status: She is alert.            Assessment & Plan:

## 2020-07-07 NOTE — Assessment & Plan Note (Addendum)
Her pain is severe Her overlying erythema is unusual for AVN (as is this degree of pain). I offered to admit her to expedite her w/u and pain control. She defers.  Will continue her oral anbx. Certainly this could be infection but would have not suspected from her MRI and chronic nature.   Will repeat her MRI asap.  Will call her with result.  Appreciate ortho f/u.

## 2020-07-07 NOTE — Assessment & Plan Note (Signed)
She has been well controlled.  No change in her art (dovato)

## 2020-07-07 NOTE — Assessment & Plan Note (Signed)
Has lost 40# with her change in art I am more concerned that there is an underlying illness driving this than a change in her rx.

## 2020-07-08 ENCOUNTER — Ambulatory Visit (HOSPITAL_COMMUNITY)
Admission: RE | Admit: 2020-07-08 | Discharge: 2020-07-08 | Disposition: A | Discharge status: Home / Self Care | Payer: 59 | Source: Ambulatory Visit | Attending: Infectious Diseases | Admitting: Infectious Diseases

## 2020-07-08 ENCOUNTER — Ambulatory Visit (HOSPITAL_COMMUNITY): Admission: RE | Admit: 2020-07-08 | Payer: 59 | Source: Ambulatory Visit

## 2020-07-08 ENCOUNTER — Ambulatory Visit (HOSPITAL_COMMUNITY): Payer: 59

## 2020-07-08 DIAGNOSIS — M8700 Idiopathic aseptic necrosis of unspecified bone: Secondary | ICD-10-CM | POA: Insufficient documentation

## 2020-07-08 IMAGING — MR MR FEMUR*L* WO/W CM
4 of 9 series · 13 of 40 positions shown · IV contrast (gadavist)
Comparison: None.

CLINICAL DATA: Upper leg pain and tenderness

EXAM:
MR OF THE LEFT LOWER EXTREMITY WITHOUT AND WITH CONTRAST
TECHNIQUE: Multiplanar, multisequence MR imaging of the left femur was
performed both before and after administration of intravenous
contrast.
CONTRAST:  10mL GADAVIST GADOBUTROL 1 MMOL/ML IV SOLN

[Series 4: T1 · coronal · 4.0mm · 0.45mm/px · 2 of 33 slices shown (1 of 2)]
[im 1/33]
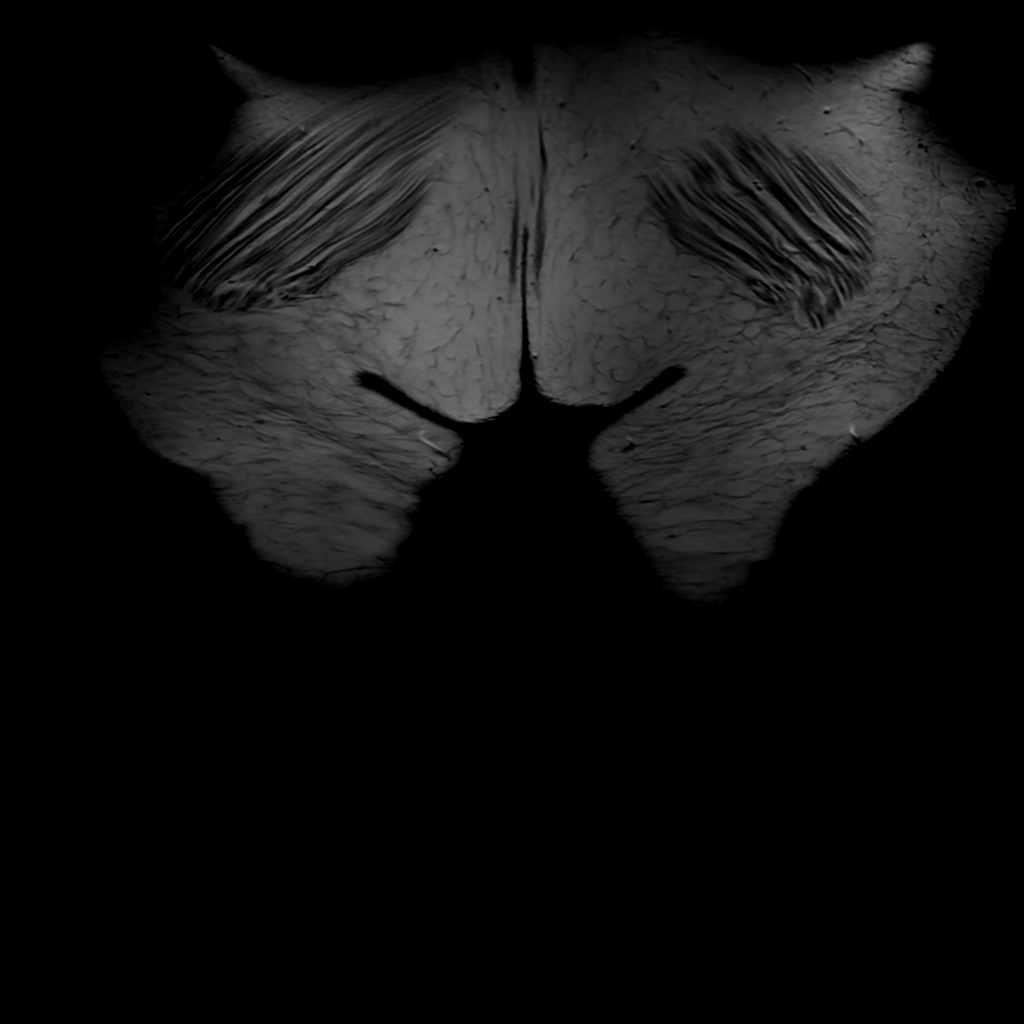
[im 33/33]
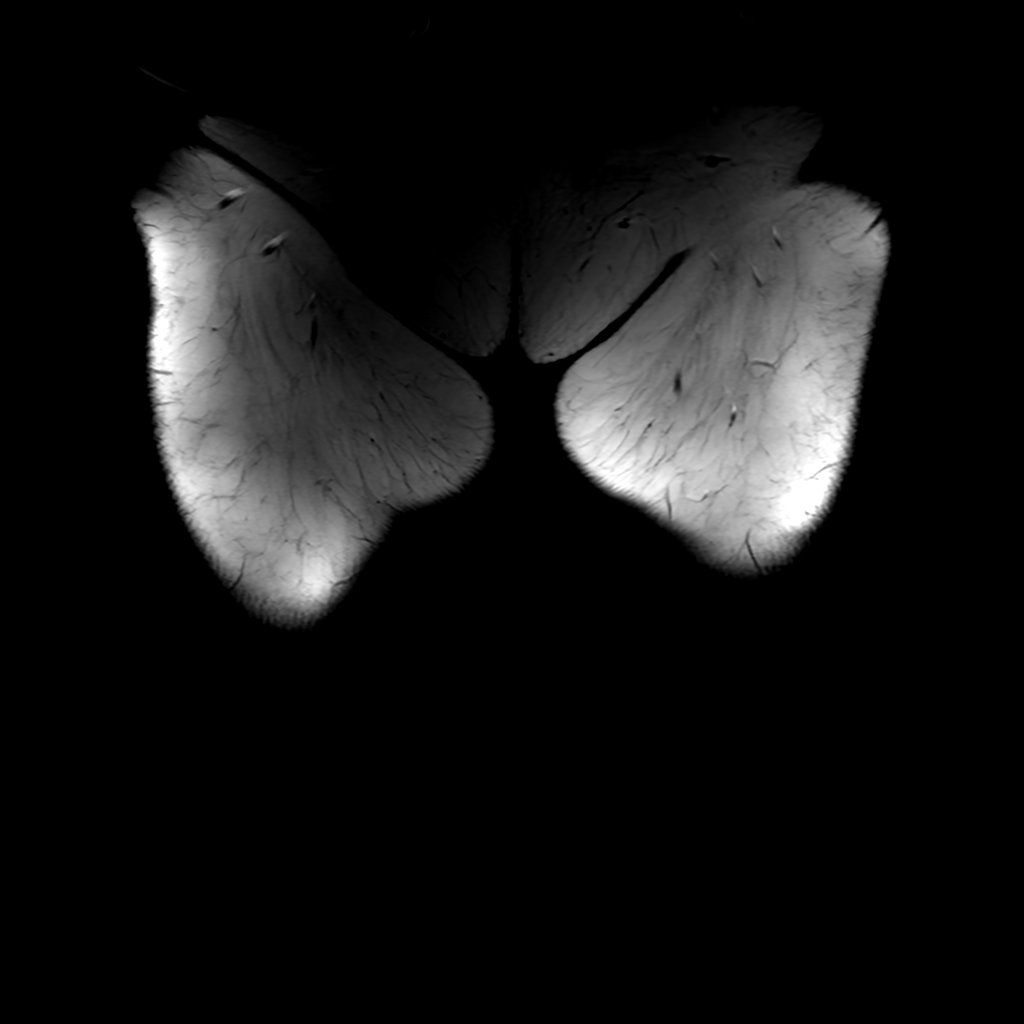

[Series 6: T2 fat-sat · axial · 5.0mm · 0.51mm/px · z∈[-405,+81]mm · 5 of 82 slices shown]
[im 1/82]
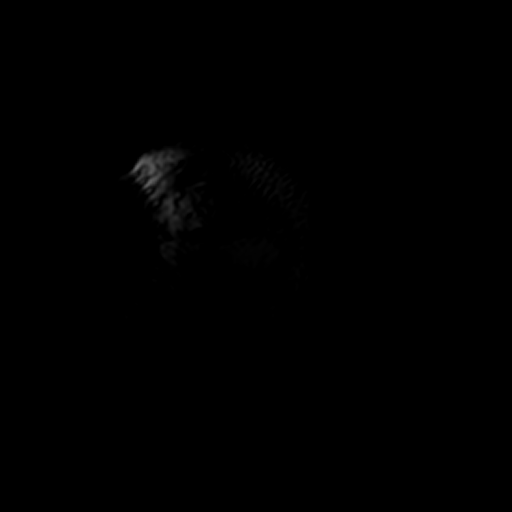
[im 17/82]
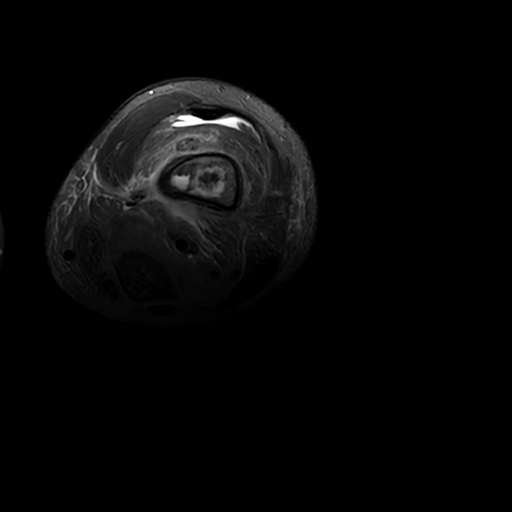
[im 33/82]
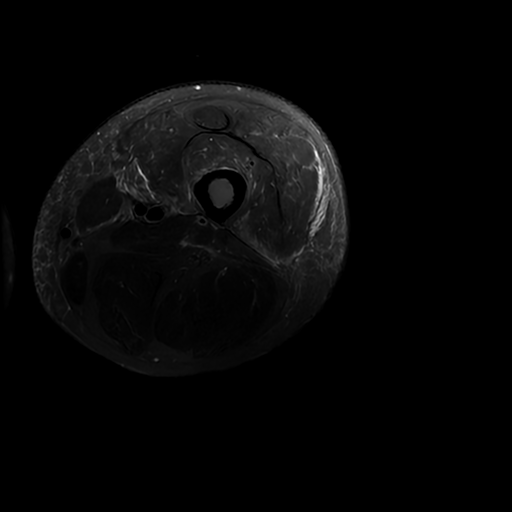
[im 49/82]
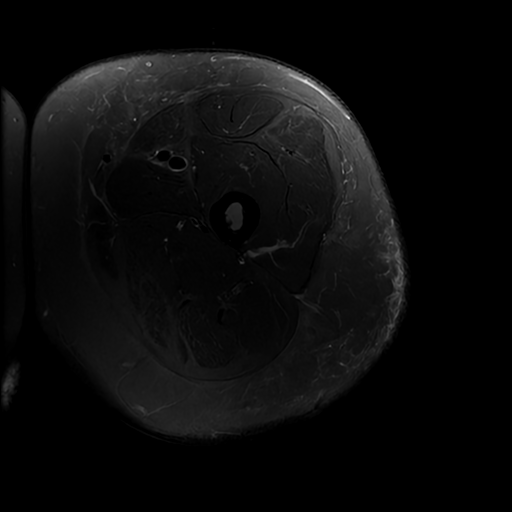
[im 82/82]
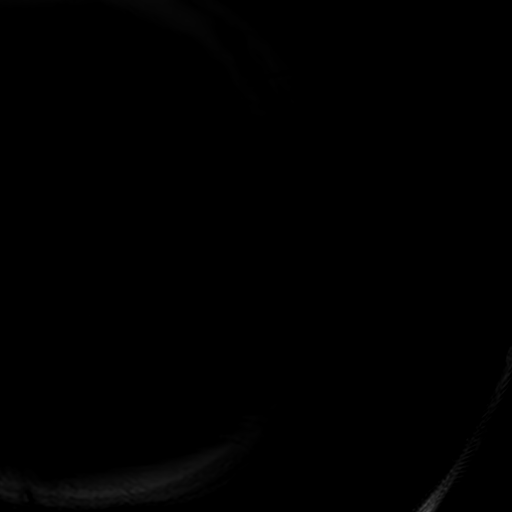

[Series 8: T1 · axial · 5.0mm · 0.51mm/px · z∈[-321,+3]mm · 3 of 82 slices shown (2 of 2)]
[im 14/82]
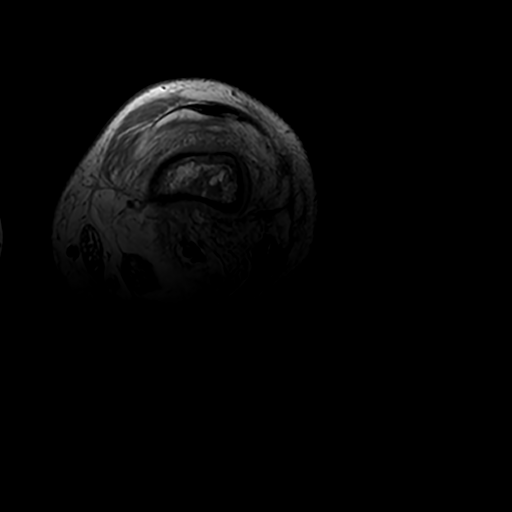
[im 41/82]
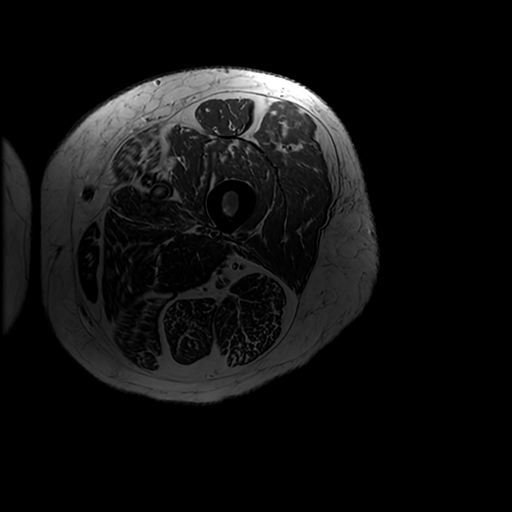
[im 68/82]
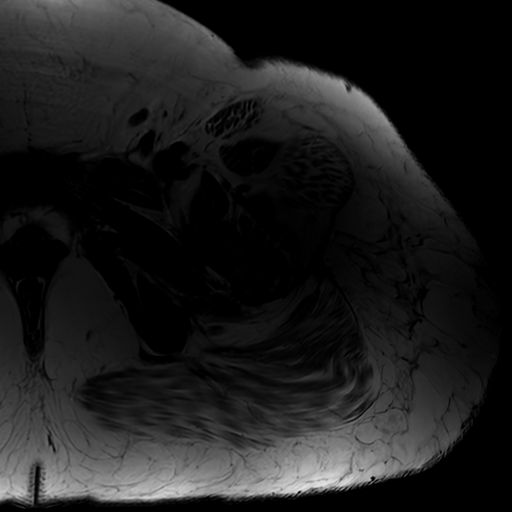

[Series 10: T1 fat-sat · axial · non-contrast · 5.0mm · 0.51mm/px · z∈[-321,+3]mm · 3 of 82 slices shown]
[im 14/82]
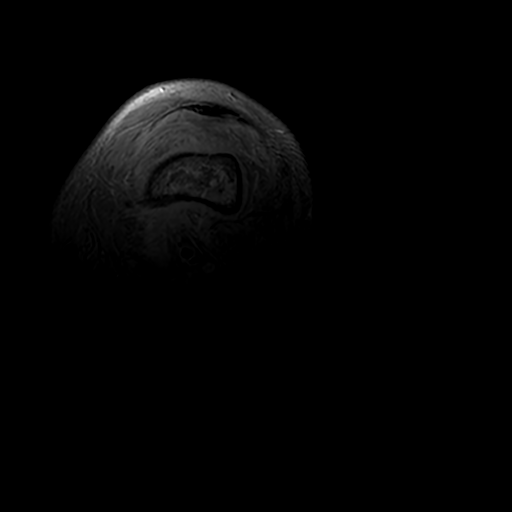
[im 41/82]
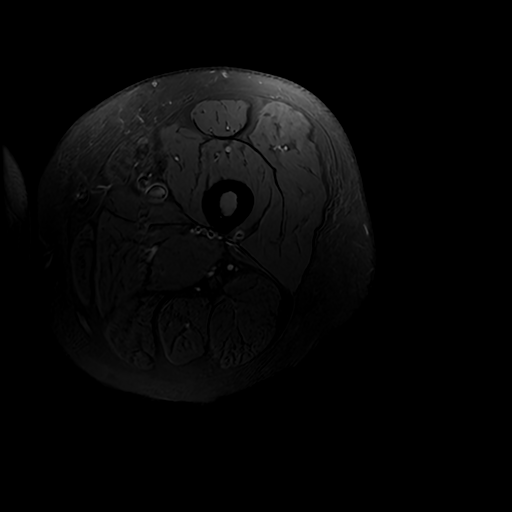
[im 68/82]
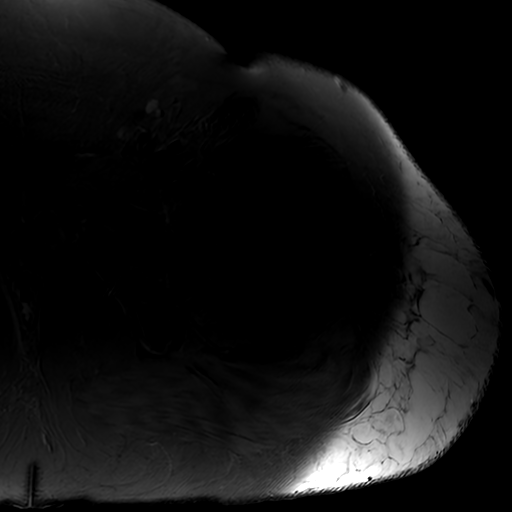

[13 of 40 positions shown; findings below may reference images not displayed]

FINDINGS: Bones/Joint/Cartilage

Low T1 signal in the shaft of the femur possibly reflecting reactive
marrow findings from the patient's high TP.

8.5 by 4.5 by 2.0 cm region of avascular necrosis in the distal
femoral metaphysis with surrounding edema and potentially some mild
periostitis suggesting a potential acute component. Given the edema
and surrounding periostitis as well as the surrounding soft tissue
edema around the distal femur, the possibility of chronic
osteomyelitis is also raised, but is considered less likely given
the central high T1 signal in this process which probably represents
revascularization in some fatty marrow, which would be unusual in
the setting of pure osteomyelitis. There is an adjacent small knee
joint effusion but without thick enhancing synovium to suggest a
septic joint. Another factor in favor of avascular necrosis is the
presence of other findings of AVN in the proximal tibia bilaterally
on additional tibial imaging performed under separate accession.

Ligaments

N/A

Muscles and Tendons

Low-grade edema tracks along the distal margins of the vastus
medialis and vastus lateralis muscles, and remains nonspecific.

Soft tissues

Mild subcutaneous edema primarily anteriorly along the thigh region.
IMPRESSION: 1. 8.5 by 4.5 by 2.0 cm region of avascular necrosis in the distal
femoral metaphysis with surrounding edema and potentially some mild
periostitis suggesting a potential acute infarct component. Given
the edema and surrounding soft tissue edema around the distal femur,
the possibility of chronic osteomyelitis is also raised, but is
considered less likely given the central high T1 signal
characteristic of revascularization and fatty marrow, which would be
unusual in the setting of pure osteomyelitis.
2. Small knee joint effusion but without thick enhancing synovium to
suggest a septic joint.
3. Low-grade edema tracks along the distal margins of the vastus
medialis and vastus lateralis muscles, and remains nonspecific.
4. Mild subcutaneous edema anteriorly along the thigh region.

## 2020-07-08 IMAGING — MR MR [PERSON_NAME] LOW WO/W CM*L*
3 of 9 series · 7 of 40 positions shown · IV contrast (10 GAD)
Comparison: None.

CLINICAL DATA: Avascular necrosis, ITP.  Pain and erythema.

EXAM:
MRI OF LOWER LEFT EXTREMITY WITHOUT AND WITH CONTRAST
TECHNIQUE: Multiplanar, multisequence MR imaging of the left tibia/fibula was
performed both before and after administration of intravenous
contrast.
CONTRAST:  10mL GADAVIST GADOBUTROL 1 MMOL/ML IV SOLN

[Series 12: T1 · coronal · 4.0mm · 0.45mm/px · 3 of 27 slices shown (1 of 2)]
[im 1/27]
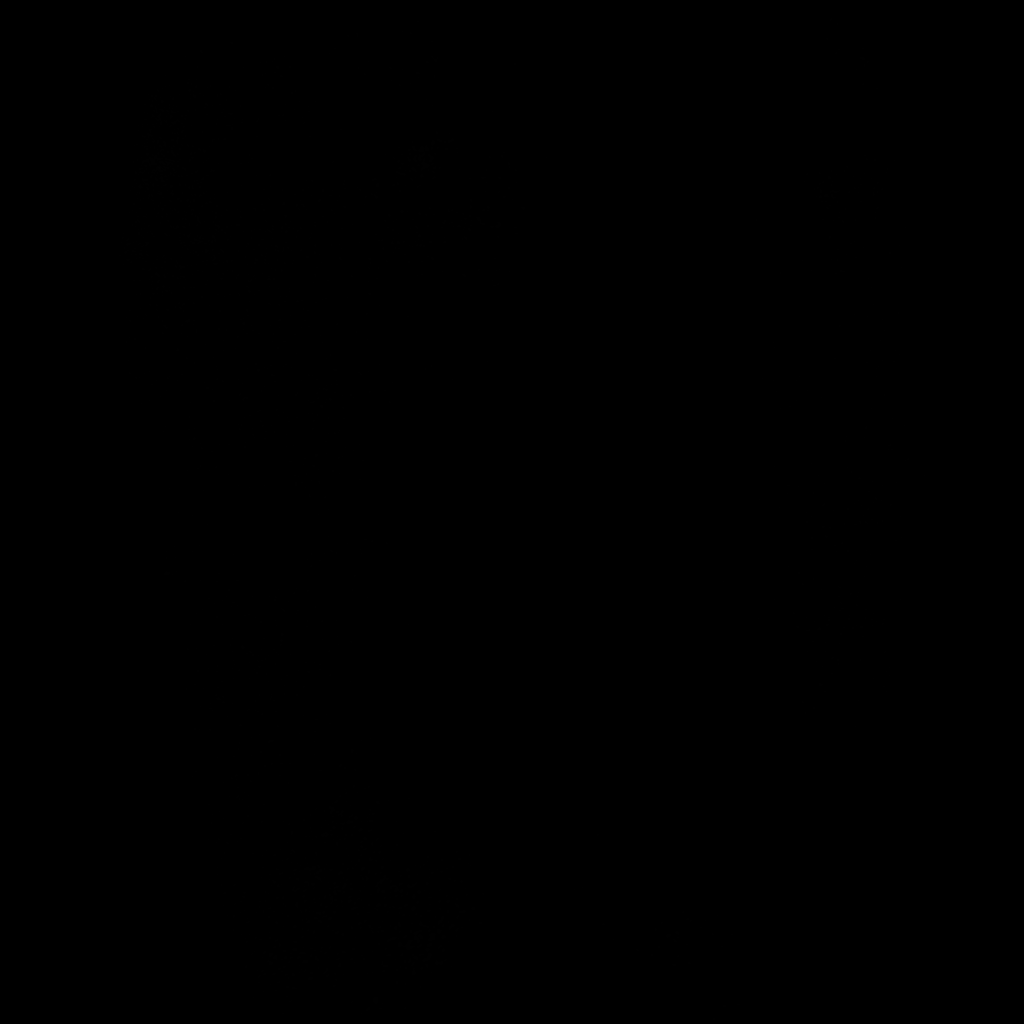
[im 14/27]
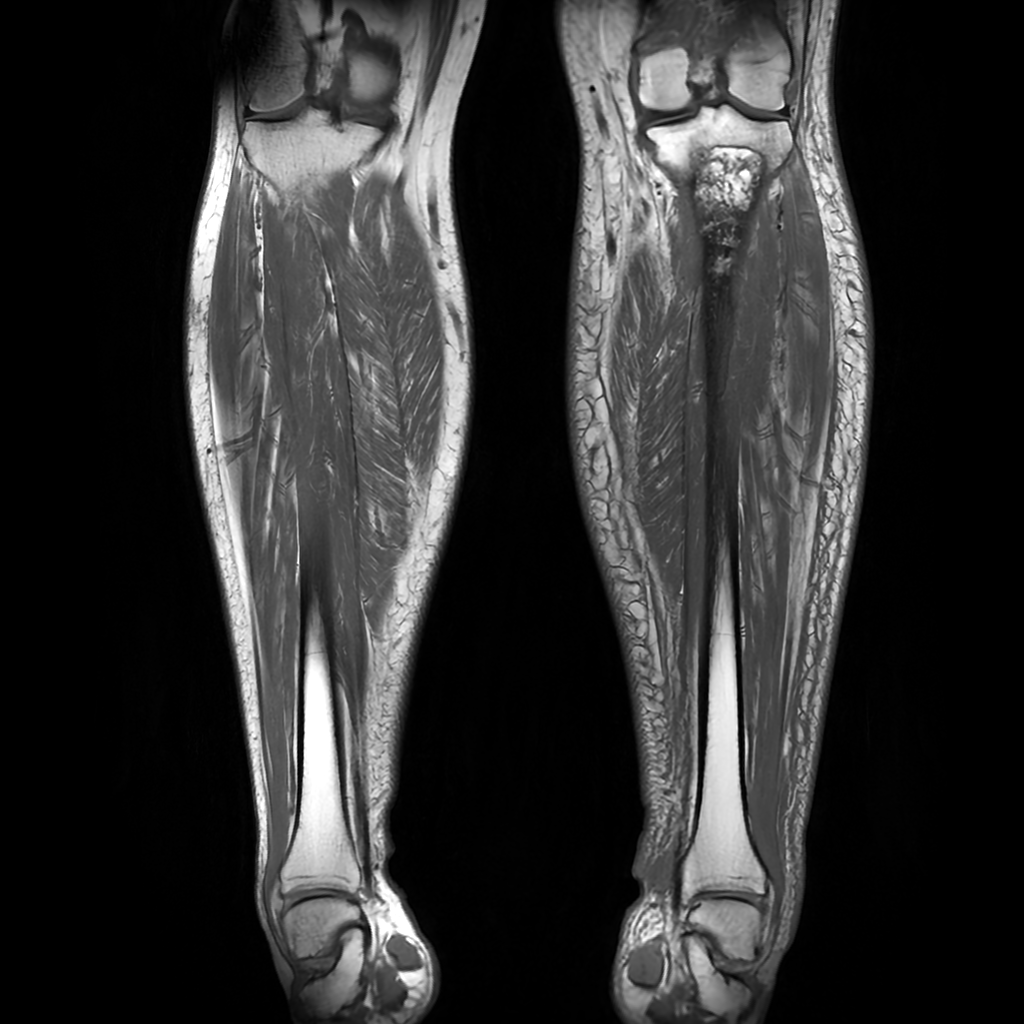
[im 27/27]
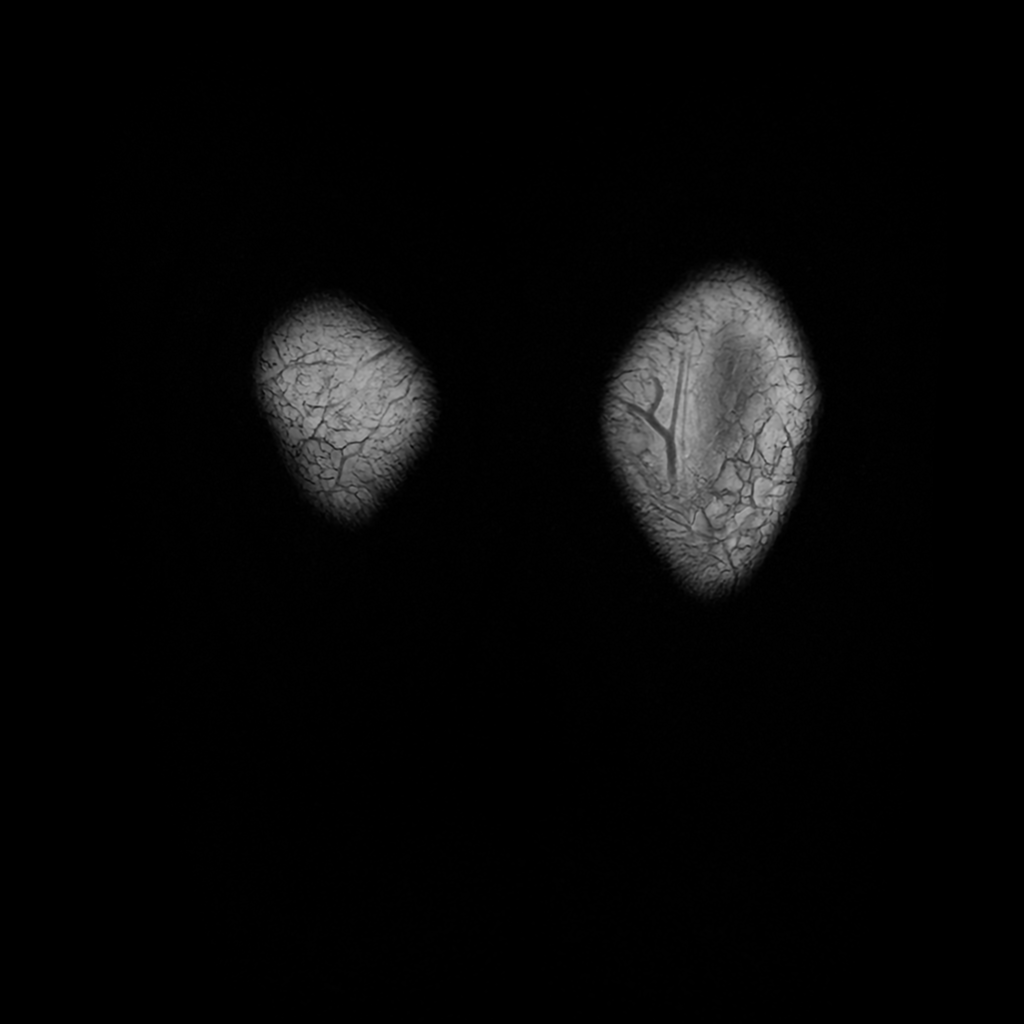

[Series 14: T1 · axial · 4.0mm · 0.25mm/px · 1 of 88 slices shown (2 of 2)]
[im 15/88]
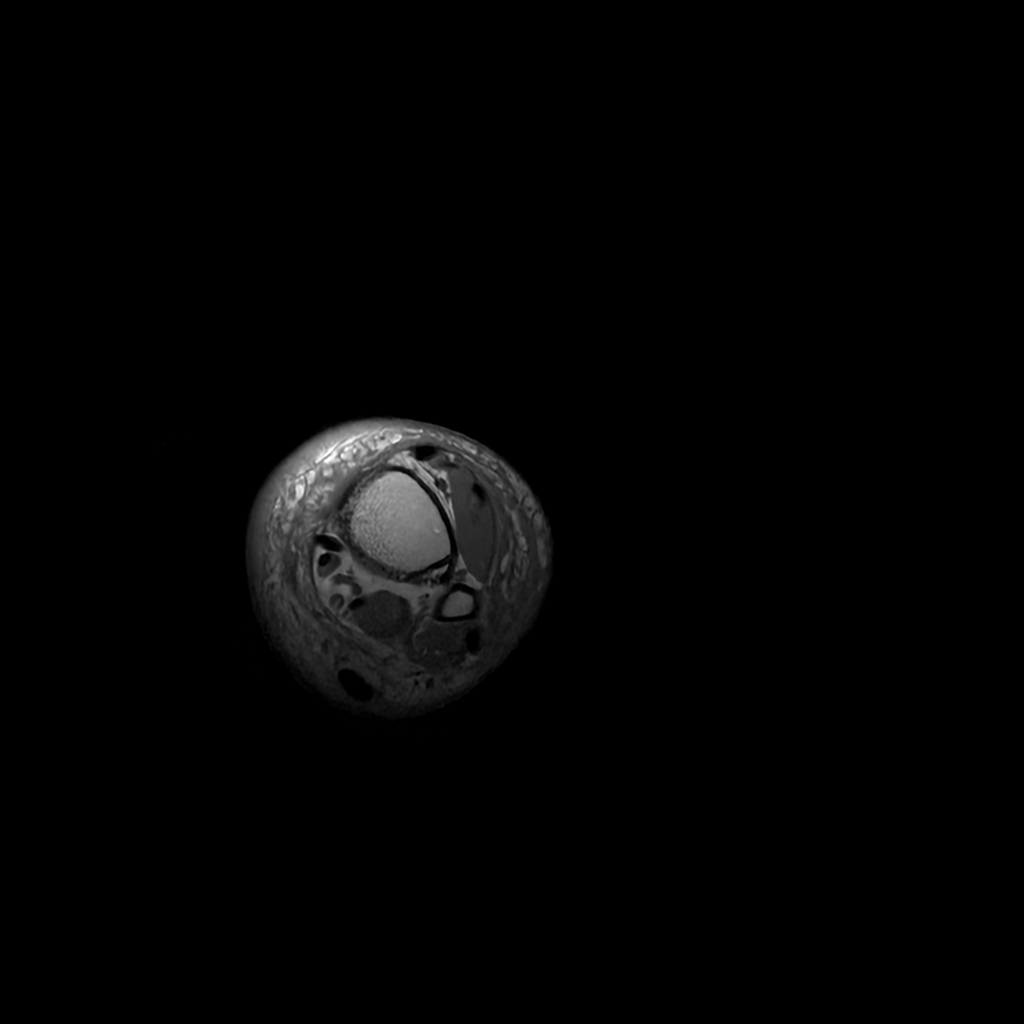

[Series 15: T2 fat-sat · axial · 4.0mm · 0.51mm/px · z∈[-693,-403]mm · 3 of 88 slices shown]
[im 15/88]
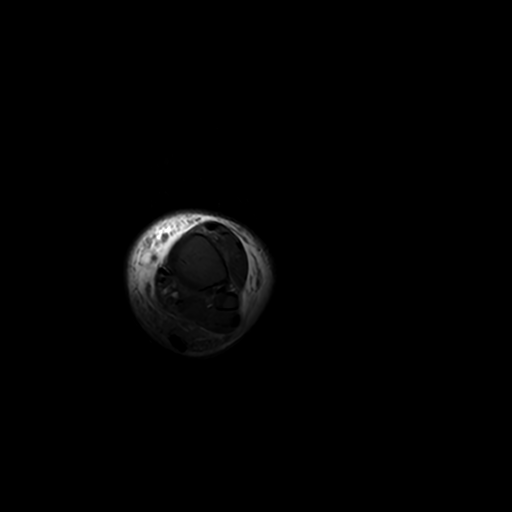
[im 44/88]
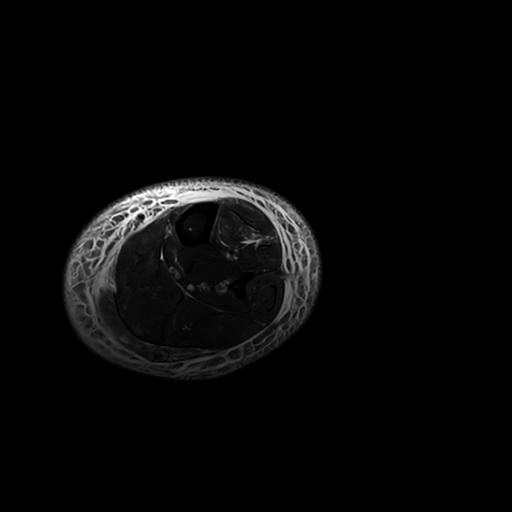
[im 73/88]
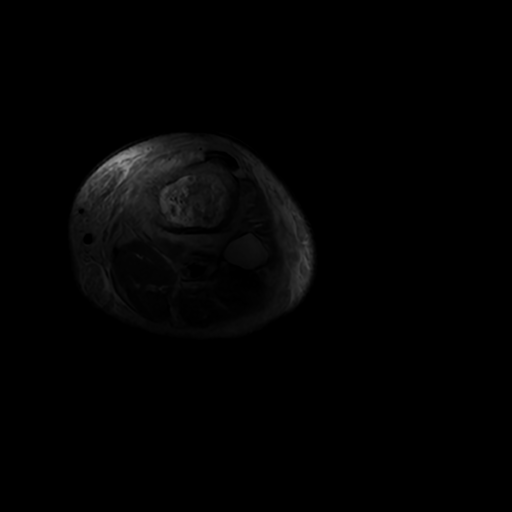

[7 of 40 positions shown; findings below may reference images not displayed]

FINDINGS: Bones/Joint/Cartilage

Chronic appearing avascular necrosis in the right proximal tibial
metaphysis. On the left side, there is a larger (11.4 by 3.6 by
cm) region of involvement in the proximal metaphysis with
surrounding marrow edema, accentuated enhancement, periostitis, and
abnormal edema and enhancement in the surrounding soft tissues
including the muscular tissues. This is somewhat similar to the
process on the contralateral side, with apparent potentially
reflecting acute on chronic infarct or superinfection along prior
AVN. Overall this is a complex appearance and accordingly difficult
to classify, although the presence of high T1 signal within the main
lesion tends to suggest some revascularization of chronic AVN. There
is some serpentine scalloped appearance of the lower margins with
associated endosteal narrowing for example on image 27 of series 14,
with overlying cortical thickening and potentially a small fluid
collection, which is an unusual appearance making it difficult to
exclude the possibility of superimposed infection.

Ligaments

N/A

Muscles and Tendons

Abnormal edema and enhancement in the musculature adjacent to the
proximal tibia including the tibialis anterior, proximal peroneus
longus, and popliteus muscles.

Soft tissues

Small complex collection in the deep subcutaneous tissues adjacent
to a region of cortical thinning, questionable communication with
the bone, measuring about 3.0 by 1.0 by 3.3 cm. This does have some
high internal T1 signal for example on image 23 of series 14 and
accordingly could represent hematoma rather than infection.
IMPRESSION: 1. Complex appearance with suspected bilateral proximal tibial
metaphysis avascular necrosis, but with extensive left-sided
proximal tibia marrow edema, endosteal scalloping, possible subtle
cortical breakthrough, and complex adjacent fluid along the proximal
tibial lesion raising a greater degree of concern for infection.
Acute on chronic infarct with periosteal hematoma is a differential
diagnostic consideration. Correlate with fever/leukocytosis.

## 2020-07-08 MED ORDER — GADOBUTROL 1 MMOL/ML IV SOLN
10.0000 mL | Freq: Once | INTRAVENOUS | Status: AC | PRN
  Administered 2020-07-08: 10 mL via INTRAVENOUS

## 2020-07-08 NOTE — Addendum Note (Signed)
Addended by: Jenah Vanasten C on: 07/08/2020 10:14 AM   Modules accepted: Orders

## 2020-07-08 NOTE — Addendum Note (Signed)
Addended by: Jodey Burbano C on: 07/08/2020 10:20 AM   Modules accepted: Orders

## 2020-07-11 ENCOUNTER — Inpatient Hospital Stay (HOSPITAL_COMMUNITY)
Admission: EM | Admit: 2020-07-11 | Discharge: 2020-07-18 | DRG: 492 | Disposition: A | Discharge status: Home / Self Care | Payer: 59 | Source: Ambulatory Visit | Attending: Internal Medicine | Admitting: Internal Medicine

## 2020-07-11 ENCOUNTER — Encounter: Payer: Self-pay | Admitting: Infectious Diseases

## 2020-07-11 ENCOUNTER — Other Ambulatory Visit: Payer: Self-pay

## 2020-07-11 ENCOUNTER — Encounter: Payer: Self-pay | Admitting: Internal Medicine

## 2020-07-11 ENCOUNTER — Telehealth: Payer: Self-pay | Admitting: Infectious Diseases

## 2020-07-11 DIAGNOSIS — Z885 Allergy status to narcotic agent status: Secondary | ICD-10-CM

## 2020-07-11 DIAGNOSIS — K5900 Constipation, unspecified: Secondary | ICD-10-CM | POA: Diagnosis not present

## 2020-07-11 DIAGNOSIS — M79605 Pain in left leg: Secondary | ICD-10-CM

## 2020-07-11 DIAGNOSIS — M25572 Pain in left ankle and joints of left foot: Secondary | ICD-10-CM | POA: Diagnosis not present

## 2020-07-11 DIAGNOSIS — M8700 Idiopathic aseptic necrosis of unspecified bone: Secondary | ICD-10-CM | POA: Diagnosis not present

## 2020-07-11 DIAGNOSIS — D539 Nutritional anemia, unspecified: Secondary | ICD-10-CM

## 2020-07-11 DIAGNOSIS — B9561 Methicillin susceptible Staphylococcus aureus infection as the cause of diseases classified elsewhere: Secondary | ICD-10-CM | POA: Diagnosis present

## 2020-07-11 DIAGNOSIS — Z87891 Personal history of nicotine dependence: Secondary | ICD-10-CM

## 2020-07-11 DIAGNOSIS — M879 Osteonecrosis, unspecified: Secondary | ICD-10-CM | POA: Diagnosis not present

## 2020-07-11 DIAGNOSIS — B20 Human immunodeficiency virus [HIV] disease: Secondary | ICD-10-CM | POA: Diagnosis present

## 2020-07-11 DIAGNOSIS — Z79899 Other long term (current) drug therapy: Secondary | ICD-10-CM

## 2020-07-11 DIAGNOSIS — M25562 Pain in left knee: Secondary | ICD-10-CM

## 2020-07-11 DIAGNOSIS — Z8249 Family history of ischemic heart disease and other diseases of the circulatory system: Secondary | ICD-10-CM

## 2020-07-11 DIAGNOSIS — M25579 Pain in unspecified ankle and joints of unspecified foot: Secondary | ICD-10-CM

## 2020-07-11 DIAGNOSIS — L02416 Cutaneous abscess of left lower limb: Secondary | ICD-10-CM | POA: Diagnosis present

## 2020-07-11 DIAGNOSIS — D693 Immune thrombocytopenic purpura: Secondary | ICD-10-CM | POA: Diagnosis present

## 2020-07-11 DIAGNOSIS — M87362 Other secondary osteonecrosis, left tibia: Secondary | ICD-10-CM | POA: Diagnosis present

## 2020-07-11 DIAGNOSIS — Z20822 Contact with and (suspected) exposure to covid-19: Secondary | ICD-10-CM | POA: Diagnosis present

## 2020-07-11 DIAGNOSIS — Z6841 Body Mass Index (BMI) 40.0 and over, adult: Secondary | ICD-10-CM

## 2020-07-11 DIAGNOSIS — E039 Hypothyroidism, unspecified: Secondary | ICD-10-CM | POA: Diagnosis present

## 2020-07-11 DIAGNOSIS — K760 Fatty (change of) liver, not elsewhere classified: Secondary | ICD-10-CM | POA: Diagnosis present

## 2020-07-11 DIAGNOSIS — R52 Pain, unspecified: Secondary | ICD-10-CM

## 2020-07-11 DIAGNOSIS — D75839 Thrombocytosis, unspecified: Secondary | ICD-10-CM | POA: Diagnosis present

## 2020-07-11 DIAGNOSIS — Z91040 Latex allergy status: Secondary | ICD-10-CM

## 2020-07-11 DIAGNOSIS — M86462 Chronic osteomyelitis with draining sinus, left tibia and fibula: Secondary | ICD-10-CM | POA: Diagnosis not present

## 2020-07-11 DIAGNOSIS — I10 Essential (primary) hypertension: Secondary | ICD-10-CM | POA: Diagnosis present

## 2020-07-11 DIAGNOSIS — E282 Polycystic ovarian syndrome: Secondary | ICD-10-CM | POA: Diagnosis present

## 2020-07-11 DIAGNOSIS — M86262 Subacute osteomyelitis, left tibia and fibula: Secondary | ICD-10-CM

## 2020-07-11 DIAGNOSIS — E669 Obesity, unspecified: Secondary | ICD-10-CM | POA: Diagnosis present

## 2020-07-11 DIAGNOSIS — Z888 Allergy status to other drugs, medicaments and biological substances status: Secondary | ICD-10-CM

## 2020-07-11 DIAGNOSIS — J45909 Unspecified asthma, uncomplicated: Secondary | ICD-10-CM | POA: Diagnosis present

## 2020-07-11 DIAGNOSIS — E441 Mild protein-calorie malnutrition: Secondary | ICD-10-CM

## 2020-07-11 DIAGNOSIS — E785 Hyperlipidemia, unspecified: Secondary | ICD-10-CM | POA: Diagnosis present

## 2020-07-11 DIAGNOSIS — K219 Gastro-esophageal reflux disease without esophagitis: Secondary | ICD-10-CM | POA: Diagnosis present

## 2020-07-11 DIAGNOSIS — E876 Hypokalemia: Secondary | ICD-10-CM | POA: Diagnosis not present

## 2020-07-11 DIAGNOSIS — G8929 Other chronic pain: Secondary | ICD-10-CM | POA: Diagnosis present

## 2020-07-11 DIAGNOSIS — F32A Depression, unspecified: Secondary | ICD-10-CM | POA: Diagnosis present

## 2020-07-11 DIAGNOSIS — L03116 Cellulitis of left lower limb: Secondary | ICD-10-CM

## 2020-07-11 DIAGNOSIS — E43 Unspecified severe protein-calorie malnutrition: Secondary | ICD-10-CM

## 2020-07-11 DIAGNOSIS — F909 Attention-deficit hyperactivity disorder, unspecified type: Secondary | ICD-10-CM | POA: Diagnosis present

## 2020-07-11 DIAGNOSIS — F419 Anxiety disorder, unspecified: Secondary | ICD-10-CM | POA: Diagnosis present

## 2020-07-11 DIAGNOSIS — M25462 Effusion, left knee: Secondary | ICD-10-CM | POA: Diagnosis present

## 2020-07-11 DIAGNOSIS — E66813 Obesity, class 3: Secondary | ICD-10-CM | POA: Diagnosis present

## 2020-07-11 LAB — COMPREHENSIVE METABOLIC PANEL
ALT: 23 U/L (ref 0–44)
AST: 26 U/L (ref 15–41)
Albumin: 3.1 g/dL — ABNORMAL LOW (ref 3.5–5.0)
Alkaline Phosphatase: 113 U/L (ref 38–126)
Anion gap: 12 (ref 5–15)
BUN: 6 mg/dL (ref 6–20)
CO2: 27 mmol/L (ref 22–32)
Calcium: 8.6 mg/dL — ABNORMAL LOW (ref 8.9–10.3)
Chloride: 94 mmol/L — ABNORMAL LOW (ref 98–111)
Creatinine, Ser: 0.68 mg/dL (ref 0.44–1.00)
GFR, Estimated: >60 mL/min (ref >60)
Glucose, Bld: 117 mg/dL — ABNORMAL HIGH (ref 70–99)
Potassium: 3.8 mmol/L (ref 3.5–5.1)
Sodium: 133 mmol/L — ABNORMAL LOW (ref 135–145)
Total Bilirubin: 0.6 mg/dL (ref 0.3–1.2)
Total Protein: 7.4 g/dL (ref 6.5–8.1)

## 2020-07-11 LAB — RESP PANEL BY RT-PCR (FLU A&B, COVID) ARPGX2
Influenza A by PCR: NEGATIVE
Influenza B by PCR: NEGATIVE
SARS Coronavirus 2 by RT PCR: NEGATIVE

## 2020-07-11 LAB — HEPATIC FUNCTION PANEL
ALT: 23 U/L (ref 0–44)
AST: 27 U/L (ref 15–41)
Albumin: 3.2 g/dL — ABNORMAL LOW (ref 3.5–5.0)
Alkaline Phosphatase: 115 U/L (ref 38–126)
Bilirubin, Direct: <0.1 mg/dL (ref 0.0–0.2)
Total Bilirubin: 0.8 mg/dL (ref 0.3–1.2)
Total Protein: 7.4 g/dL (ref 6.5–8.1)

## 2020-07-11 LAB — CBC WITH DIFFERENTIAL/PLATELET
Abs Immature Granulocytes: 0.04 10*3/uL (ref 0.00–0.07)
Basophils Absolute: 0 10*3/uL (ref 0.0–0.1)
Basophils Relative: 0 %
Eosinophils Absolute: 0 10*3/uL (ref 0.0–0.5)
Eosinophils Relative: 0 %
HCT: 33.3 % — ABNORMAL LOW (ref 36.0–46.0)
Hemoglobin: 10.4 g/dL — ABNORMAL LOW (ref 12.0–15.0)
Immature Granulocytes: 0 %
Lymphocytes Relative: 16 %
Lymphs Abs: 1.5 10*3/uL (ref 0.7–4.0)
MCH: 34.3 pg — ABNORMAL HIGH (ref 26.0–34.0)
MCHC: 31.2 g/dL (ref 30.0–36.0)
MCV: 109.9 fL — ABNORMAL HIGH (ref 80.0–100.0)
Monocytes Absolute: 0.9 10*3/uL (ref 0.1–1.0)
Monocytes Relative: 9 %
Neutro Abs: 7.1 10*3/uL (ref 1.7–7.7)
Neutrophils Relative %: 75 %
Platelets: 466 10*3/uL — ABNORMAL HIGH (ref 150–400)
RBC: 3.03 MIL/uL — ABNORMAL LOW (ref 3.87–5.11)
RDW: 14.3 % (ref 11.5–15.5)
WBC: 9.6 10*3/uL (ref 4.0–10.5)
nRBC: 0 % (ref 0.0–0.2)

## 2020-07-11 LAB — CBC
HCT: 32.4 % — ABNORMAL LOW (ref 36.0–46.0)
Hemoglobin: 10.1 g/dL — ABNORMAL LOW (ref 12.0–15.0)
MCH: 34.5 pg — ABNORMAL HIGH (ref 26.0–34.0)
MCHC: 31.2 g/dL (ref 30.0–36.0)
MCV: 110.6 fL — ABNORMAL HIGH (ref 80.0–100.0)
Platelets: 473 10*3/uL — ABNORMAL HIGH (ref 150–400)
RBC: 2.93 MIL/uL — ABNORMAL LOW (ref 3.87–5.11)
RDW: 14.6 % (ref 11.5–15.5)
WBC: 10.4 10*3/uL (ref 4.0–10.5)
nRBC: 0 % (ref 0.0–0.2)

## 2020-07-11 LAB — BASIC METABOLIC PANEL
Anion gap: 10 (ref 5–15)
BUN: <5 mg/dL — ABNORMAL LOW (ref 6–20)
CO2: 28 mmol/L (ref 22–32)
Calcium: 8.7 mg/dL — ABNORMAL LOW (ref 8.9–10.3)
Chloride: 96 mmol/L — ABNORMAL LOW (ref 98–111)
Creatinine, Ser: 0.61 mg/dL (ref 0.44–1.00)
GFR, Estimated: >60 mL/min (ref >60)
Glucose, Bld: 97 mg/dL (ref 70–99)
Potassium: 3.8 mmol/L (ref 3.5–5.1)
Sodium: 134 mmol/L — ABNORMAL LOW (ref 135–145)

## 2020-07-11 LAB — MAGNESIUM: Magnesium: 2 mg/dL (ref 1.7–2.4)

## 2020-07-11 LAB — PROTIME-INR
INR: 1.1 (ref 0.8–1.2)
Prothrombin Time: 13.9 seconds (ref 11.4–15.2)

## 2020-07-11 LAB — PHOSPHORUS: Phosphorus: 3.6 mg/dL (ref 2.5–4.6)

## 2020-07-11 MED ORDER — LAMIVUDINE 150 MG PO TABS
300.0000 mg | ORAL_TABLET | Freq: Every day | ORAL | Status: DC
  Administered 2020-07-12 – 2020-07-18 (×6): 300 mg via ORAL
  Filled 2020-07-11 (×7): qty 2

## 2020-07-11 MED ORDER — ASCORBIC ACID 500 MG PO TABS
1000.0000 mg | ORAL_TABLET | Freq: Every day | ORAL | Status: DC
  Administered 2020-07-11 – 2020-07-18 (×5): 1000 mg via ORAL
  Filled 2020-07-11 (×6): qty 2

## 2020-07-11 MED ORDER — MELATONIN 5 MG PO TABS
15.0000 mg | ORAL_TABLET | Freq: Every day | ORAL | Status: DC
  Administered 2020-07-11 – 2020-07-17 (×7): 15 mg via ORAL
  Filled 2020-07-11 (×7): qty 3

## 2020-07-11 MED ORDER — ENOXAPARIN SODIUM 40 MG/0.4ML ~~LOC~~ SOLN: 40.0000 mg | SUBCUTANEOUS | Status: DC

## 2020-07-11 MED ORDER — DOLUTEGRAVIR SODIUM 50 MG PO TABS
50.0000 mg | ORAL_TABLET | Freq: Every day | ORAL | Status: DC
  Administered 2020-07-12 – 2020-07-18 (×6): 50 mg via ORAL
  Filled 2020-07-11 (×7): qty 1

## 2020-07-11 MED ORDER — DOLUTEGRAVIR-LAMIVUDINE 50-300 MG PO TABS: 1.0000 | ORAL_TABLET | Freq: Every day | ORAL | Status: DC

## 2020-07-11 MED ORDER — KETOROLAC TROMETHAMINE 30 MG/ML IJ SOLN
30.0000 mg | Freq: Once | INTRAMUSCULAR | Status: AC
  Administered 2020-07-11: 30 mg via INTRAVENOUS
  Filled 2020-07-11: qty 1

## 2020-07-11 MED ORDER — VENLAFAXINE HCL ER 75 MG PO CP24
225.0000 mg | ORAL_CAPSULE | Freq: Every evening | ORAL | Status: DC
  Administered 2020-07-11 – 2020-07-18 (×8): 225 mg via ORAL
  Filled 2020-07-11: qty 1
  Filled 2020-07-11: qty 3
  Filled 2020-07-11 (×2): qty 1
  Filled 2020-07-11: qty 3
  Filled 2020-07-11: qty 1
  Filled 2020-07-11: qty 3
  Filled 2020-07-11: qty 1

## 2020-07-11 MED ORDER — AMPHETAMINE-DEXTROAMPHET ER 10 MG PO CP24
20.0000 mg | ORAL_CAPSULE | Freq: Every morning | ORAL | Status: DC
  Administered 2020-07-12 – 2020-07-18 (×4): 20 mg via ORAL
  Filled 2020-07-11 (×5): qty 2

## 2020-07-11 MED ORDER — HYDROMORPHONE HCL 1 MG/ML IJ SOLN
1.0000 mg | INTRAMUSCULAR | Status: AC | PRN
Start: 2020-07-12 — End: 2020-07-14
  Administered 2020-07-13 – 2020-07-14 (×4): 1 mg via INTRAVENOUS
  Filled 2020-07-11 (×4): qty 1

## 2020-07-11 MED ORDER — SODIUM CHLORIDE 0.9 % IV SOLN: INTRAVENOUS | Status: DC

## 2020-07-11 MED ORDER — ONDANSETRON HCL 4 MG/2ML IJ SOLN
4.0000 mg | Freq: Once | INTRAMUSCULAR | Status: AC
  Administered 2020-07-11: 4 mg via INTRAVENOUS
  Filled 2020-07-11: qty 2

## 2020-07-11 MED ORDER — GABAPENTIN 300 MG PO CAPS
300.0000 mg | ORAL_CAPSULE | Freq: Every day | ORAL | Status: DC
  Administered 2020-07-11 – 2020-07-17 (×7): 300 mg via ORAL
  Filled 2020-07-11 (×7): qty 1

## 2020-07-11 MED ORDER — POLYETHYLENE GLYCOL 3350 17 G PO PACK
17.0000 g | PACK | Freq: Every day | ORAL | Status: DC
  Administered 2020-07-13 – 2020-07-16 (×2): 17 g via ORAL
  Filled 2020-07-11 (×4): qty 1

## 2020-07-11 MED ORDER — HYDROMORPHONE HCL 2 MG/ML IJ SOLN
2.0000 mg | Freq: Once | INTRAMUSCULAR | Status: AC
  Administered 2020-07-11: 2 mg via INTRAVENOUS
  Filled 2020-07-11: qty 1

## 2020-07-11 MED ORDER — DOCUSATE SODIUM 100 MG PO CAPS
100.0000 mg | ORAL_CAPSULE | Freq: Every day | ORAL | Status: DC
  Administered 2020-07-11: 100 mg via ORAL
  Filled 2020-07-11 (×3): qty 1

## 2020-07-11 MED ORDER — PROCHLORPERAZINE EDISYLATE 10 MG/2ML IJ SOLN: 5.0000 mg | INTRAMUSCULAR | Status: DC | PRN

## 2020-07-11 MED ORDER — ALPRAZOLAM 0.5 MG PO TABS
0.5000 mg | ORAL_TABLET | Freq: Two times a day (BID) | ORAL | Status: DC | PRN
  Administered 2020-07-12 – 2020-07-18 (×5): 0.5 mg via ORAL
  Filled 2020-07-11 (×5): qty 1

## 2020-07-11 MED ORDER — KETOROLAC TROMETHAMINE 30 MG/ML IJ SOLN
30.0000 mg | Freq: Once | INTRAMUSCULAR | Status: AC
  Administered 2020-07-12: 30 mg via INTRAVENOUS
  Filled 2020-07-11: qty 1

## 2020-07-11 MED ORDER — ACETAMINOPHEN 650 MG RE SUPP: 650.0000 mg | Freq: Four times a day (QID) | RECTAL | Status: DC | PRN

## 2020-07-11 MED ORDER — HYDROMORPHONE HCL 1 MG/ML IJ SOLN
1.0000 mg | Freq: Once | INTRAMUSCULAR | Status: AC
  Administered 2020-07-11: 1 mg via INTRAVENOUS
  Filled 2020-07-11: qty 1

## 2020-07-11 MED ORDER — BUSPIRONE HCL 5 MG PO TABS
15.0000 mg | ORAL_TABLET | Freq: Two times a day (BID) | ORAL | Status: DC
  Administered 2020-07-11 – 2020-07-18 (×12): 15 mg via ORAL
  Filled 2020-07-11 (×13): qty 1

## 2020-07-11 MED ORDER — ACETAMINOPHEN 325 MG PO TABS
650.0000 mg | ORAL_TABLET | Freq: Four times a day (QID) | ORAL | Status: DC | PRN
  Administered 2020-07-11 – 2020-07-14 (×2): 650 mg via ORAL
  Filled 2020-07-11 (×3): qty 2

## 2020-07-11 NOTE — Telephone Encounter (Signed)
Called pt and asked her to go to [facility] ED. She asks to go [facility] instead.  I will see her w/i next 24h

## 2020-07-11 NOTE — Telephone Encounter (Signed)
I called Lindsay French x 2 on 3-19 when I saw her MRI report. I left VM urging her to go to hospital.  I did not get a call back.  I spoke with her today, she is ready to go to ED. Her pain is unchanged, her erythema is slightly better.  Spoke with [facility]- will admit her to [facility] if they accept. She needs biopsy.

## 2020-07-11 NOTE — H&P (Signed)
Pt seen in waiting room of ED.  Her leg is more swollen, erythematous.  Would Have surgery eval for aspirate, bx After this, consider anbx  I will f/u in AM and write full consult note.

## 2020-07-11 NOTE — H&P (Signed)
History and Physical    Lindsay Shellerrika Salahuddin ZOX:[id number] DOB: 09/24/1980 DOA: 07/11/2020  PCP: Adrienne MochaQuinn, Kiera A, PA   Patient coming from: Home.   I have personally briefly reviewed patient's [facility]  Chief Complaint: LLE pain and swelling.  HPI: Lindsay French is a 40 y.o. female with medical history significant of ADHD, anxiety, depression, asthma, fatty liver disease, elevated LFT, GERD, history of heatstroke with syncopal episode, thrombocytopenia before HIV diagnosis, HIV on antiretroviral therapy, hyperlipidemia, hypertension, hypothyroidism, class III obesity, polycystic ovarian syndrome, history of pneumonia, diagnosed with AVN on 02/2020 who is coming to the emergency department referred by her ID specialist (Dr. Johny SaxJeffrey Hatcher) after having exacerbation of pain of left pretibial area associated with edema and erythema with MRI ordered by Dr. Ninetta LightsHatcher and performed on Friday demonstrated complex fluid collection with marrow edema suspicious for acute on chronic AVN with infectious complex.  She has been taking cephalexin 500 mg p.o. 4 times a day, but Dr. Ninetta LightsHatcher recommended to avoid antibiotic use until a biopsy is performed.  She denies fever, chills, but feels very tired due to the pain.  Her appetite is decreased.  The pain has made it difficult for her to sleep in recent nights.  She denies dyspnea, chest pain, palpitations, diaphoresis, PND, orthopnea, abdominal pain, nausea, emesis, diarrhea, melena or hematochezia.  She gets constipated on occasion due to opioid use.  No dysuria, frequency hematuria.  No polyuria, polydipsia, polyphagia or blurred vision.  ED Course: Initial vital signs were temperature 99 F, pulse 100, respirations 16, BP 126/77 mmHg and O2 sat 98% on room air.  The patient received 4 mg of ondansetron and 1 mg of hydromorphone IVP in the emergency department.  They also spoke to Dr. Debria Garretahari from orthopedic surgery who told the ED provider will  evaluate the patient in the morning.  Labwork: CBC showed a white count of 9.6, hemoglobin 10.4 g/dL with an MCV of 811.9109.9 fL and platelets 466.  BMP showed a sodium 134 and chloride of 96 mmol/L.  The rest of the electrolytes, glucose and renal function are within normal limits.  LFTs were unremarkable, except for an albumin of 3.1 g/dL.  Imaging: Findings consistent with AVN with concerns for superimposed infection and periosteal hematoma.  I have attached the reports of the [facility] and MRI ordered by Dr. Ninetta LightsHatcher that was done this past Friday.  Please see attached reports below.  Review of Systems: As per HPI otherwise all other systems reviewed and are negative.  Past Medical History:  Diagnosis Date  . ADHD   . Anxiety   . Asthma    usually with colds  . Depression   . Elevated LFTs   . Fatty liver 04/16/2016   Noted US ABD  . GERD (gastroesophageal reflux disease)   . Heart murmur    at birth, no issues  . History of heat stroke    with syncope  . History of thrombocytopenia    prior to diagnosis HIV  . HIV (human immunodeficiency virus infection) (HCC)   . Hyperlipidemia   . Hypertension   . Hypothyroidism   . Obese   . PCOS (polycystic ovarian syndrome)   . Pneumonia 04/17/2016  . Wears partial dentures    upper  . Wrist fracture 11/2017   Left   Past Surgical History:  Procedure Laterality Date  . HYSTEROSCOPY N/A 12/31/2017   Procedure: HYSTEROSCOPY and polypectomy;  Surgeon: Fermin Schwab, MD;  Location: [facility];  Service: Gynecology;  Laterality: N/A;  . maxillofacial surgery     Social History  reports that she has quit smoking. Her smoking use included cigarettes. She has a 4.50 pack-year smoking history. She has never used smokeless tobacco. She reports current alcohol use of about 7.0 standard drinks of alcohol per week. She reports previous drug use. Drug: "Crack"  cocaine.  Allergies  Allergen Reactions  . Guaifenesin Nausea And Vomiting  . Hydrocodone Itching    hot  . Lactose Intolerance (Gi) Other (See Comments)    Upset stomach  . Latex Other (See Comments)    As a teenager when using latex condoms she would get a yeast infection.    Family History  Problem Relation Age of Onset  . Hypothyroidism Mother   . Hypertension Father    Prior to Admission medications   Medication Sig Start Date End Date Taking? Authorizing Provider  albuterol (VENTOLIN HFA) 108 (90 Base) MCG/ACT inhaler Inhale 1 puff into the lungs every 6 (six) hours as needed for wheezing or shortness of breath. 02/04/20  Yes [provider]  ALPRAZolam Prudy Feeler) 0.5 MG tablet Take 0.5 mg by mouth 2 (two) times daily as needed for anxiety.  12/21/15  Yes [provider]  amphetamine-dextroamphetamine (ADDERALL XR) 20 MG 24 hr capsule Take 20 mg by mouth every morning. 04/11/20  Yes [provider]  Ascorbic Acid (VITAMIN C) 1000 MG tablet Take 1,000 mg by mouth daily.   Yes [provider]  busPIRone (BUSPAR) 15 MG tablet Take 15 mg by mouth 2 (two) times daily. 06/20/20  Yes [provider]  celecoxib (CELEBREX) 200 MG capsule Take 1 capsule (200 mg total) by mouth 2 (two) times daily. 12/01/19  Yes Harris, Abigail, PA-C  cephALEXin (KEFLEX) 500 MG capsule Take 1 capsule (500 mg total) by mouth 4 (four) times daily. Patient taking differently: Take 500 mg by mouth 4 (four) times daily. Start date :07/05/20 07/05/20  Yes Ginnie Smart, MD  Cyanocobalamin (VITAMIN B 12 PO) Take 1,000 mcg by mouth daily.   Yes [provider]  docusate sodium (COLACE) 100 MG capsule Take 100 mg by mouth daily.   Yes [provider]  [name] 50-300 MG TABS TAKE 1 TABLET BY MOUTH DAILY 07/01/20 09/29/20 Yes Ginnie Smart, MD  gabapentin (NEURONTIN) 100 MG capsule Take 300 mg by mouth at bedtime.   Yes [provider]  irbesartan  (AVAPRO) 150 MG tablet Take 150 mg by mouth every evening. 02/06/19  Yes [provider]  melatonin 5 MG TABS Take 15 mg by mouth at bedtime.   Yes [provider]  morphine (MS CONTIN) 15 MG 12 hr tablet Take 15-30 mg by mouth See admin instructions. Takes 1 tablet in the morning, 1 tablet at noon and 2 tablets at night 06/24/20  Yes [provider]  OVER THE COUNTER MEDICATION Take 400 mcg by mouth daily. Methyl Folate 400 mcg   Yes [provider]  OVER THE COUNTER MEDICATION Take 50 mg by mouth daily. P-5-P   Yes [provider]  oxyCODONE (OXY IR/ROXICODONE) 5 MG immediate release tablet Take 5 mg by mouth every 6 (six) hours as needed for severe pain.   Yes [provider]  polyethylene glycol (MIRALAX / GLYCOLAX) 17 g packet Take 17 g by mouth daily.   Yes [provider]  venlafaxine XR (EFFEXOR-XR) 75 MG 24 hr capsule  Take 225 mg by mouth every evening. 12/21/15  Yes [provider]  MELATONIN PO Take 1 tablet by mouth at bedtime as needed (sleep).    [provider]   Physical Exam: Vitals:   07/11/20 1930 07/11/20 1945 07/11/20 2000 07/11/20 2147  BP: 111/65 124/82 (!) 131/92 122/76  Pulse: (!) 101 (!) 105 95 94  Resp: 16 15 14 20   Temp:    100 F (37.8 C)  TempSrc:    Oral  SpO2: 95% 95% 100% 99%   Constitutional: NAD, calm, comfortable Eyes: PERRL, lids and conjunctivae normal ENMT: Mucous membranes are moist. Posterior pharynx clear of any exudate or lesions. Neck: normal, supple, no masses, no thyromegaly Respiratory: clear to auscultation bilaterally, no wheezing, no crackles. Normal respiratory effort. No accessory muscle use.  Cardiovascular: Regular rate and rhythm, no murmurs / rubs / gallops. No extremity edema. 2+ pedal pulses. No carotid bruits.  Abdomen: Obese, no distention.  Bowel sounds positive.  Soft, no tenderness, no masses palpated. No hepatosplenomegaly. Musculoskeletal: no  clubbing / cyanosis. Good ROM, no contractures. Normal muscle tone.  Skin: Positive erythema, edema, calor and TTP on left pretibial area.  See picture below. Neurologic: CN 2-12 grossly intact. Sensation intact, DTR normal. Strength 5/5 in all 4.  Psychiatric: Normal judgment and insight. Alert and oriented x 3. Normal mood.     Labs on Admission: I have personally reviewed following labs and imaging studies  CBC: Recent Labs  Lab 07/11/20 1812  WBC 9.6  NEUTROABS 7.1  HGB 10.4*  HCT 33.3*  MCV 109.9*  PLT 466*    Basic Metabolic Panel: Recent Labs  Lab 07/11/20 1812  NA 134*  K 3.8  CL 96*  CO2 28  GLUCOSE 97  BUN <5*  CREATININE 0.61  CALCIUM 8.7*    GFR: Estimated Creatinine Clearance: 113.3 mL/min (by C-G formula based on SCr of 0.61 mg/dL).  Liver Function Tests: No results for input(s): AST, ALT, ALKPHOS, BILITOT, PROT, ALBUMIN in the last 168 hours.  Urine analysis: No results found for: COLORURINE, APPEARANCEUR, LABSPEC, PHURINE, GLUCOSEU, HGBUR, BILIRUBINUR, KETONESUR, PROTEINUR, UROBILINOGEN, NITRITE, LEUKOCYTESUR  Radiological Exams on Admission:  [facility] - 06/23/2020 12:47 PM EST  Study: XR TIBIA FIBULA LEFT 2 VIEWS Views: 2 Indication: D48.0 Neoplasm of uncertain behavior of bone and articular cartilage. Comparisons: Outside MRI performed May 26, 2020. Findings/Impression:  Alignment: No dislocation. Bones: Permeative lytic appearance of the proximal tibial diaphysis with suggestion of subtle areas of endosteal scalloping. Subtle smooth periosteal reaction is also demonstrated. No cortical destruction/erosion. Subtle lucency is suggested within the partially imaged distal femoral metadiaphysis. Findings are nonspecific with neoplastic, infectious and systemic etiologies considered. Systemic etiology such as osteonecrosis/bone infarct favored given the multifocal nature demonstrated on outside MRI. Recommend clinical  correlation and correlation with report from outside MRI. Additionally, follow-up radiographs are suggested, as occult fracture would be difficult to exclude. Joint spaces: Preserved. Soft tissue: Soft tissue edema noted along the anterior margin of the proximal tibia.  Electronically Signed by: May 28, 2020, DO, [facility] Electronically Signed on: 06/23/2020 12:47 PM    Narrative & Impression  CLINICAL DATA:  Avascular necrosis, ITP.  Pain and erythema.  EXAM: MRI OF LOWER LEFT EXTREMITY WITHOUT AND WITH CONTRAST  TECHNIQUE: Multiplanar, multisequence MR imaging of the left tibia/fibula was performed both before and after administration of intravenous contrast.  CONTRAST:  69mL GADAVIST GADOBUTROL 1 MMOL/ML IV SOLN  COMPARISON:  None.  FINDINGS: Bones/Joint/Cartilage  Chronic appearing  avascular necrosis in the right proximal tibial metaphysis. On the left side, there is a larger (11.4 by 3.6 by 3.1 cm) region of involvement in the proximal metaphysis with surrounding marrow edema, accentuated enhancement, periostitis, and abnormal edema and enhancement in the surrounding soft tissues including the muscular tissues. This is somewhat similar to the process on the contralateral side, with apparent potentially reflecting acute on chronic infarct or superinfection along prior AVN. Overall this is a complex appearance and accordingly difficult to classify, although the presence of high T1 signal within the main lesion tends to suggest some revascularization of chronic AVN. There is some serpentine scalloped appearance of the lower margins with associated endosteal narrowing for example on image 27 of series 14, with overlying cortical thickening and potentially a small fluid collection, which is an unusual appearance making it difficult to exclude the possibility of superimposed infection.  Ligaments: N/A  Muscles and Tendons: Abnormal edema and enhancement in  the musculature adjacent to the proximal tibia including the tibialis anterior, proximal peroneus longus, and popliteus muscles.  Soft tissues: Small complex collection in the deep subcutaneous tissues adjacent to a region of cortical thinning, questionable communication with the bone, measuring about 3.0 by 1.0 by 3.3 cm. This does have some high internal T1 signal for example on image 23 of series 14 and accordingly could represent hematoma rather than infection.  IMPRESSION: 1. Complex appearance with suspected bilateral proximal tibial metaphysis avascular necrosis, but with extensive left-sided proximal tibia marrow edema, endosteal scalloping, possible subtle cortical breakthrough, and complex adjacent fluid along the proximal tibial lesion raising a greater degree of concern for infection. Acute on chronic infarct with periosteal hematoma is a differential diagnostic consideration. Correlate with fever/leukocytosis.  Electronically Signed   By: Gaylyn Rong M.D.   On: 07/08/2020 17:32   EKG: Independently reviewed.   Assessment/Plan Principal Problem:   Avascular necrosis of bone (HCC) Observation/MedSurg. Keep n.p.o. after midnight. Analgesics as needed. Orthopedic surgery will see in a.m. for biopsy. Infectious diseases to recommend ABX post biopsy.  Active Problems:   ADHD (attention deficit hyperactivity disorder) Continue Adderall XR 20 mg p.o. daily.    Hypertension Blood pressure has been soft at times. Will hold Avapro tonight. Currently on maintenance IVF.    Human immunodeficiency virus (HIV) disease (HCC) Last CD4 count in November/2021 was 545. Last viral load in November/2021 was less than 1.30 cps/mL. Continue antiretroviral therapy. Follow-up with Dr. Ninetta Lights as scheduled.    Hypothyroidism No longer on levothyroxine. Check TSH level. Follow-up with PCP.    GERD (gastroesophageal reflux disease) Famotidine 20 mg p.o. twice  daily..    Class 3 obesity with a BMI of 40.17 kg/m. Lifestyle modifications. Follow-up with PCP.    Hepatic steatosis Advised to cease wine drinking. She would benefit from weight loss.    Hyperlipidemia Not on medical therapy. Increased CVD risk in the setting of HIV disease and class III obesity.    Thrombocytosis In the setting of left pretibial area AVN with periosteal hematoma.  Monitor platelet count. Consider pharmacological DVT prophylaxis once surgically clear.    Macrocytic anemia Check folic acid and B12 level.    DVT prophylaxis: Unilateral RLE SCD due to possible periosteal hematoma/tenderness on LLE. Code Status:   Full code. Family Communication: Disposition Plan:   Patient is from:  Home.  Anticipated DC to:  Home.  Anticipated DC date:  07/12/2020 or 07/14/2018  Anticipated DC barriers: Clinical status/bone biopsy.  Consults called:  Dr. Johny Sax  will consult in the morning.  Dr. Venita Lick was contacted by ED and will also see in AM. Added on Epic as consulting providers.  Admission status:  Observation/MedSurg.   Severity of Illness: High severity due to presenting with LLE erythema, edema and tenderness to palpation in the setting of acute on chronic avascular necrosis.  The patient will need a biopsy/culture of this area before ID recommends antibiotic therapy.  Bobette Mo MD [facility]  How to contact the [facility] Attending or Consulting provider 7A - 7P or covering provider during after hours 7P -7A, for this patient?   1. Check the care team in [facility] and look for a) attending/consulting [facility] provider listed and b) the [facility] team listed 2. Log into [email or web address] and use [facility]'s universal password to access. If you do not have the password, please contact the hospital operator. 3. Locate the [facility] provider you are looking for under [facility] and page to a number that you can be directly reached. 4. If you still have  difficulty reaching the provider, please page the [facility] (Director on Call) for the Hospitalists listed on amion for assistance.  07/11/2020, 10:55 PM   This document was prepared using Dragon voice recognition system and may contain some unintended transcription errors.

## 2020-07-11 NOTE — ED Triage Notes (Signed)
Pt presents with c/o left leg pain/swelling. Pt reports that she has a hx of vascular necrosis in both legs but that since November, she has had pain and swelling in the left leg. Pt reports she is HIV positive and her infectious disease doctor did an MRI with contrast recently of that leg and found that she has fluid in and around the bone marrow. He sent her here for a biopsy. Pt's left leg is swollen and red.

## 2020-07-11 NOTE — ED Provider Notes (Signed)
[facility]-EMERGENCY DEPT Provider Note   CSN: [id number] Arrival date & time: 07/11/20  1256     History Chief Complaint  Patient presents with  . Leg Pain    Lindsay French is a 40 y.o. female with a past medical history of HIV, last CD4 count 540 and viral load undetectable who presents the emergency department with left leg pain and swelling.  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  She has had progressively worsening pain, swelling and redness of the left lower extremity with progressive development of erythema of the surrounding tissue over the last month.  She has severe pain in the leg and is on chronic pain medication for this.  She had an MRI performed in the outpatient setting ordered by Dr. Enedina Finner who manages her chronic infectious disease.  She was found to have complex fluid collection with marrow edema concerning and suspicious for acute on chronic AVN with infectious complex.  She was admitted by Dr. Ninetta Lights but will need surgical consult and aspiration of fluid.  Patient currently complains of pain.  She denies fevers or chills.  She has not taken her antibiotics today and according to note written by Dr. Ninetta Lights he asks that we refrain from antibiotic use until aspiration is performed.  HPI     Past Medical History:  Diagnosis Date  . ADHD   . Anxiety   . Asthma    usually with colds  . Depression   . Elevated LFTs   . Fatty liver 04/16/2016   Noted [place] ABD  . GERD (gastroesophageal reflux disease)   . Heart murmur    at birth, no issues  . History of heat stroke    with syncope  . History of thrombocytopenia    prior to diagnosis HIV  . HIV (human immunodeficiency virus infection) (HCC)   . Hyperlipidemia   . Hypertension   . Hypothyroidism   . Obese   . PCOS (polycystic ovarian syndrome)   . Pneumonia 04/17/2016  . Wears partial dentures    upper  . Wrist fracture 11/2017   Left     Patient Active Problem List   Diagnosis Date Noted  . AVN (avascular necrosis of bone) (HCC) 07/07/2020  . Back pain 03/24/2019  . Infertility, female 03/24/2019  . PCOS (polycystic ovarian syndrome) 03/24/2019  . Human immunodeficiency virus (HIV) disease (HCC) 04/24/2018  . Health care maintenance 04/24/2018  . Hyperlipidemia 04/10/2017  . Community acquired pneumonia of right upper lobe of lung 04/16/2016  . Acute respiratory failure with hypoxia (HCC) 04/16/2016  . Acute bronchitis 04/16/2016  . Hypertension 04/16/2016  . Obesity 03/21/2016  . Screening examination for venereal disease 03/30/2014  . Encounter for long-term (current) use of medications 03/30/2014  . Idiopathic thrombocytopenic purpura (ITP) (HCC) 11/14/2010  . ADHD (attention deficit hyperactivity disorder) 11/14/2010    Past Surgical History:  Procedure Laterality Date  . HYSTEROSCOPY N/A 12/31/2017   Procedure: HYSTEROSCOPY and polypectomy;  Surgeon: Fermin Schwab, MD;  Location: [facility];  Service: Gynecology;  Laterality: N/A;  . maxillofacial surgery       OB History   No obstetric history on file.     Family History  Problem Relation Age of Onset  . Hypothyroidism Mother   . Hypertension Father     Social History   Tobacco Use  . Smoking status: Former Smoker    Packs/day: 0.30    Years: 15.00  Pack years: 4.50    Types: Cigarettes  . Smokeless tobacco: Never Used  Vaping Use  . Vaping Use: Never used  Substance Use Topics  . Alcohol use: Yes    Alcohol/week: 7.0 standard drinks    Types: 7 Glasses of wine per week    Comment: wine  2-3 times a week  . Drug use: Not Currently    Types: "Crack" cocaine    Comment: Previous hx of crack use - clean for 7 years     Home Medications Prior to Admission medications   Medication Sig Start Date End Date Taking? Authorizing Provider  ALPRAZolam Prudy Feeler) 0.5 MG tablet Take 0.5 mg by mouth 2 (two) times daily as  needed for anxiety.  12/21/15   [provider]  baclofen (LIORESAL) 10 MG tablet Take 1 tablet (10 mg total) by mouth 3 (three) times daily as needed for muscle spasms. 12/01/19   Anoop Hemmer, PA-C  Camphor-Menthol-Methyl Sal [facility] EX) Apply 1 patch topically as needed (pain).    [provider]  celecoxib (CELEBREX) 200 MG capsule Take 1 capsule (200 mg total) by mouth 2 (two) times daily. 12/01/19   Lowery Paullin, PA-C  cephALEXin (KEFLEX) 500 MG capsule Take 1 capsule (500 mg total) by mouth 4 (four) times daily. 07/05/20   Ginnie Smart, MD  diclofenac Sodium (VOLTAREN) 1 % GEL Apply 2 g topically 4 (four) times daily as needed (pain).    [provider]  [name] 50-300 MG TABS TAKE 1 TABLET BY MOUTH DAILY 07/01/20 09/29/20  Ginnie Smart, MD  gabapentin (NEURONTIN) 100 MG capsule Take 100 mg by mouth 3 (three) times daily.    [provider]  irbesartan (AVAPRO) 150 MG tablet Take 150 mg by mouth daily. 02/06/19   [provider]  MELATONIN PO Take 1 tablet by mouth at bedtime as needed (sleep).    [provider]  oxyCODONE (OXY IR/ROXICODONE) 5 MG immediate release tablet oxycodone 5 mg tablet    [provider]  venlafaxine XR (EFFEXOR-XR) 75 MG 24 hr capsule Take 225 capsules by mouth every evening.  12/21/15   [provider]    Allergies    Guaifenesin, Hydrocodone, and Latex  Review of Systems   Review of Systems Ten systems reviewed and are negative for acute change, except as noted in the HPI.   Physical Exam Updated Vital Signs BP 125/83   Pulse 97   Temp 99 F (37.2 C) (Oral)   Resp 13   SpO2 100%   Physical Exam Vitals and nursing note reviewed.  Constitutional:      General: She is not in acute distress.    Appearance: She is well-developed. She is not diaphoretic.  HENT:     Head: Normocephalic and atraumatic.  Eyes:     General: No scleral icterus.    Extraocular Movements:  Extraocular movements intact.     Conjunctiva/sclera: Conjunctivae normal.     Pupils: Pupils are equal, round, and reactive to light.  Cardiovascular:     Rate and Rhythm: Normal rate and regular rhythm.     Heart sounds: Normal heart sounds. No murmur heard. No friction rub. No gallop.   Pulmonary:     Effort: Pulmonary effort is normal. No respiratory distress.     Breath sounds: Normal breath sounds.  Abdominal:     General: Bowel sounds are normal. There is no distension.     Palpations: Abdomen is soft. There is no mass.  Tenderness: There is no abdominal tenderness. There is no guarding.  Musculoskeletal:     Cervical back: Normal range of motion.  Skin:    General: Skin is warm and dry.     Comments: Left leg edema and erythema- exquisetly ttp.  2+ dp/pt pulse NL  Neurological:     Mental Status: She is alert and oriented to person, place, and time.  Psychiatric:        Behavior: Behavior normal.     ED Results / Procedures / Treatments   Labs (all labs ordered are listed, but only abnormal results are displayed) Labs Reviewed - No data to display  EKG None  Radiology No results found.  Procedures Procedures Medications Ordered in ED Medications - No data to display  ED Course  I have reviewed the triage vital signs and the nursing notes.  Pertinent labs & imaging results that were available during my care of the patient were reviewed by me and considered in my medical decision making (see chart for details).    MDM Rules/Calculators/A&P                         40 year old female here with known HIV/AVN of the bilateral tibias.  She has a fluid collection of the deep spaces of the left lower extremity.  Case discussed with Dr. Debria Garret proximal who is aware of the patient and they will consult on her tomorrow morning.  She will be admitted by Dr. Robb Matar.  I ordered and reviewed labs which shows no elevated white blood cell count, BMP without significant or  abnormality.  Covid panel is negative.  Patient stable throughout her ED visit.  I am holding antibiotics as directed by Dr. Ninetta Lights which should be restarted after fluid aspiration.  Final Clinical Impression(s) / ED Diagnoses Final diagnoses:  None    Rx / DC Orders ED Discharge Orders    None       Arthor Captain, PA-C 07/11/20 2322    Cheryll Cockayne, MD 07/13/20 [id number]

## 2020-07-12 ENCOUNTER — Observation Stay (HOSPITAL_COMMUNITY): Payer: 59

## 2020-07-12 DIAGNOSIS — Z6841 Body Mass Index (BMI) 40.0 and over, adult: Secondary | ICD-10-CM | POA: Diagnosis not present

## 2020-07-12 DIAGNOSIS — B20 Human immunodeficiency virus [HIV] disease: Secondary | ICD-10-CM

## 2020-07-12 DIAGNOSIS — M25562 Pain in left knee: Secondary | ICD-10-CM | POA: Diagnosis not present

## 2020-07-12 DIAGNOSIS — D75839 Thrombocytosis, unspecified: Secondary | ICD-10-CM

## 2020-07-12 DIAGNOSIS — I10 Essential (primary) hypertension: Secondary | ICD-10-CM | POA: Diagnosis present

## 2020-07-12 DIAGNOSIS — E669 Obesity, unspecified: Secondary | ICD-10-CM | POA: Diagnosis not present

## 2020-07-12 DIAGNOSIS — Z20822 Contact with and (suspected) exposure to covid-19: Secondary | ICD-10-CM | POA: Diagnosis present

## 2020-07-12 DIAGNOSIS — L03116 Cellulitis of left lower limb: Secondary | ICD-10-CM | POA: Diagnosis present

## 2020-07-12 DIAGNOSIS — M8700 Idiopathic aseptic necrosis of unspecified bone: Secondary | ICD-10-CM | POA: Diagnosis not present

## 2020-07-12 DIAGNOSIS — B9561 Methicillin susceptible Staphylococcus aureus infection as the cause of diseases classified elsewhere: Secondary | ICD-10-CM | POA: Diagnosis present

## 2020-07-12 DIAGNOSIS — J45909 Unspecified asthma, uncomplicated: Secondary | ICD-10-CM | POA: Diagnosis present

## 2020-07-12 DIAGNOSIS — B9689 Other specified bacterial agents as the cause of diseases classified elsewhere: Secondary | ICD-10-CM | POA: Diagnosis not present

## 2020-07-12 DIAGNOSIS — D693 Immune thrombocytopenic purpura: Secondary | ICD-10-CM | POA: Diagnosis present

## 2020-07-12 DIAGNOSIS — M879 Osteonecrosis, unspecified: Secondary | ICD-10-CM | POA: Diagnosis present

## 2020-07-12 DIAGNOSIS — G8929 Other chronic pain: Secondary | ICD-10-CM | POA: Diagnosis present

## 2020-07-12 DIAGNOSIS — K219 Gastro-esophageal reflux disease without esophagitis: Secondary | ICD-10-CM | POA: Diagnosis present

## 2020-07-12 DIAGNOSIS — D539 Nutritional anemia, unspecified: Secondary | ICD-10-CM | POA: Diagnosis present

## 2020-07-12 DIAGNOSIS — M86262 Subacute osteomyelitis, left tibia and fibula: Secondary | ICD-10-CM | POA: Diagnosis not present

## 2020-07-12 DIAGNOSIS — F32A Depression, unspecified: Secondary | ICD-10-CM | POA: Diagnosis present

## 2020-07-12 DIAGNOSIS — M87362 Other secondary osteonecrosis, left tibia: Secondary | ICD-10-CM | POA: Diagnosis present

## 2020-07-12 DIAGNOSIS — F909 Attention-deficit hyperactivity disorder, unspecified type: Secondary | ICD-10-CM | POA: Diagnosis present

## 2020-07-12 DIAGNOSIS — K5900 Constipation, unspecified: Secondary | ICD-10-CM | POA: Diagnosis not present

## 2020-07-12 DIAGNOSIS — E039 Hypothyroidism, unspecified: Secondary | ICD-10-CM | POA: Diagnosis present

## 2020-07-12 DIAGNOSIS — K760 Fatty (change of) liver, not elsewhere classified: Secondary | ICD-10-CM | POA: Diagnosis present

## 2020-07-12 DIAGNOSIS — L02416 Cutaneous abscess of left lower limb: Secondary | ICD-10-CM | POA: Diagnosis present

## 2020-07-12 DIAGNOSIS — E43 Unspecified severe protein-calorie malnutrition: Secondary | ICD-10-CM | POA: Diagnosis present

## 2020-07-12 DIAGNOSIS — F419 Anxiety disorder, unspecified: Secondary | ICD-10-CM | POA: Diagnosis present

## 2020-07-12 DIAGNOSIS — E876 Hypokalemia: Secondary | ICD-10-CM | POA: Diagnosis not present

## 2020-07-12 DIAGNOSIS — M86462 Chronic osteomyelitis with draining sinus, left tibia and fibula: Secondary | ICD-10-CM | POA: Diagnosis present

## 2020-07-12 LAB — CBC
HCT: 29.9 % — ABNORMAL LOW (ref 36.0–46.0)
Hemoglobin: 9.4 g/dL — ABNORMAL LOW (ref 12.0–15.0)
MCH: 34.8 pg — ABNORMAL HIGH (ref 26.0–34.0)
MCHC: 31.4 g/dL (ref 30.0–36.0)
MCV: 110.7 fL — ABNORMAL HIGH (ref 80.0–100.0)
Platelets: 428 10*3/uL — ABNORMAL HIGH (ref 150–400)
RBC: 2.7 MIL/uL — ABNORMAL LOW (ref 3.87–5.11)
RDW: 14.4 % (ref 11.5–15.5)
WBC: 8.3 10*3/uL (ref 4.0–10.5)
nRBC: 0 % (ref 0.0–0.2)

## 2020-07-12 LAB — COMPREHENSIVE METABOLIC PANEL
ALT: 21 U/L (ref 0–44)
AST: 21 U/L (ref 15–41)
Albumin: 2.9 g/dL — ABNORMAL LOW (ref 3.5–5.0)
Alkaline Phosphatase: 103 U/L (ref 38–126)
Anion gap: 8 (ref 5–15)
BUN: 7 mg/dL (ref 6–20)
CO2: 28 mmol/L (ref 22–32)
Calcium: 8.5 mg/dL — ABNORMAL LOW (ref 8.9–10.3)
Chloride: 98 mmol/L (ref 98–111)
Creatinine, Ser: 0.61 mg/dL (ref 0.44–1.00)
GFR, Estimated: >60 mL/min (ref >60)
Glucose, Bld: 100 mg/dL — ABNORMAL HIGH (ref 70–99)
Potassium: 3.8 mmol/L (ref 3.5–5.1)
Sodium: 134 mmol/L — ABNORMAL LOW (ref 135–145)
Total Bilirubin: 0.7 mg/dL (ref 0.3–1.2)
Total Protein: 6.5 g/dL (ref 6.5–8.1)

## 2020-07-12 LAB — TSH: TSH: 4.888 u[IU]/mL — ABNORMAL HIGH (ref 0.350–4.500)

## 2020-07-12 LAB — FOLATE: Folate: 6.5 ng/mL (ref >5.9)

## 2020-07-12 LAB — VITAMIN B12: Vitamin B-12: 282 pg/mL (ref 180–914)

## 2020-07-12 IMAGING — US IR BIOPSY CORE MUSCLE/SOFT TISSUE
1 series · 13 of 20 positions shown · non-contrast
Comparison: none

INDICATION: 39-year-old woman with findings suspicious for left tibia
osteomyelitis with overlying myositis presents over short Ya
logistic for aspiration/biopsy.

[Series 1: ir biopsy core muscle/soft tissue · 13 of 20 slices shown]
[im 1/20]
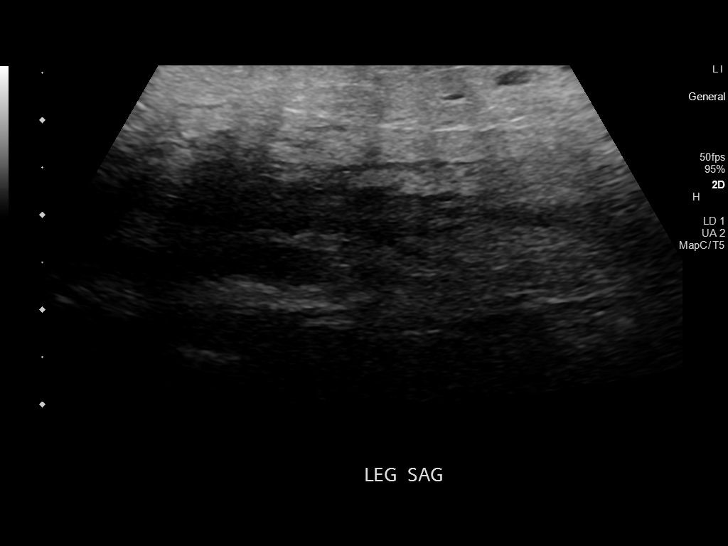
[im 3/20]
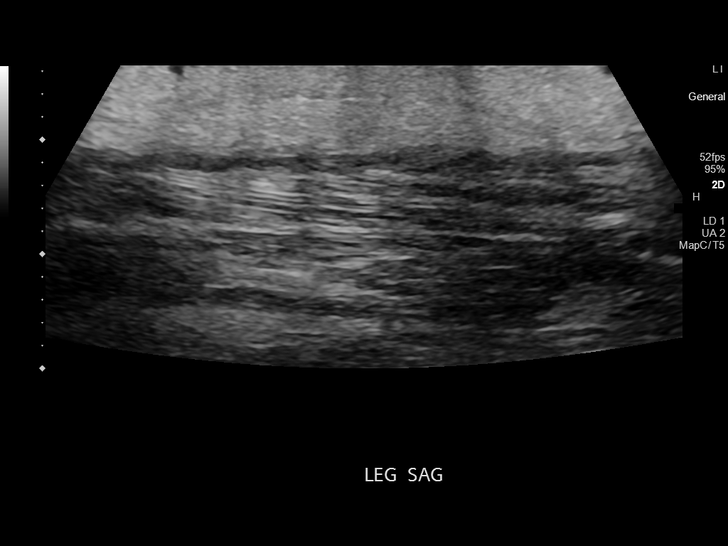
[im 4/20]
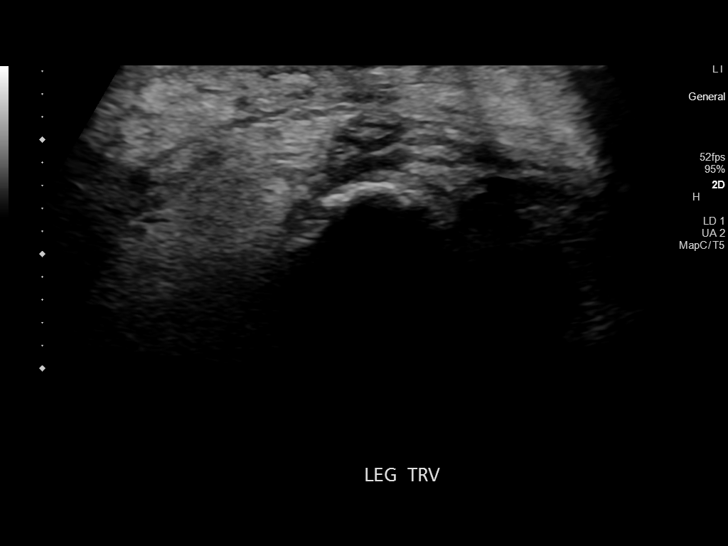
[im 6/20]
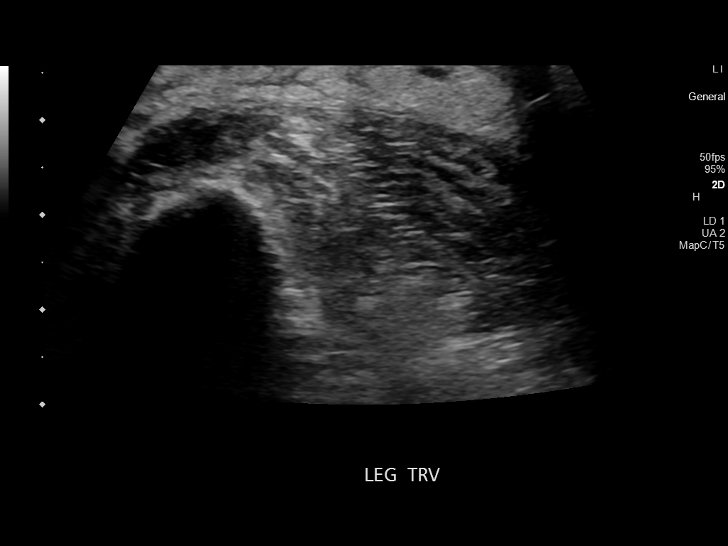
[im 7/20]
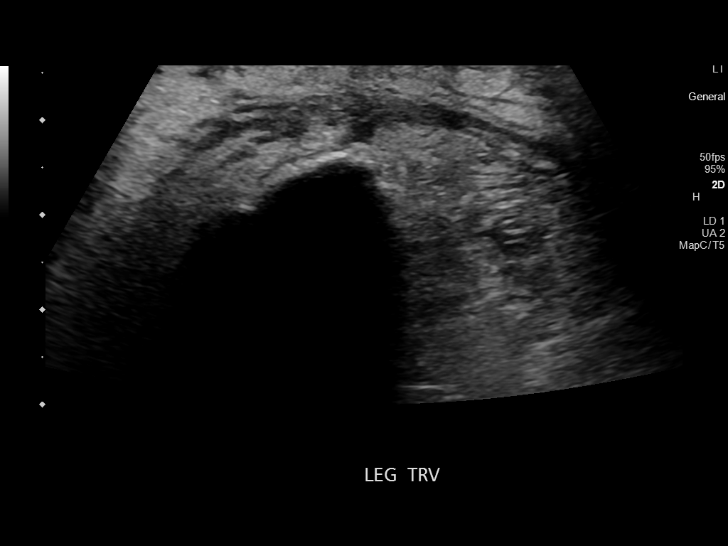
[im 9/20]
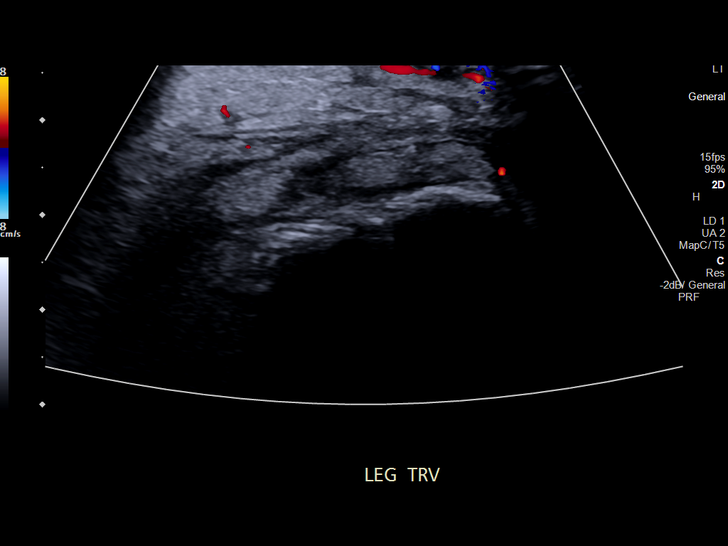
[im 11/20]
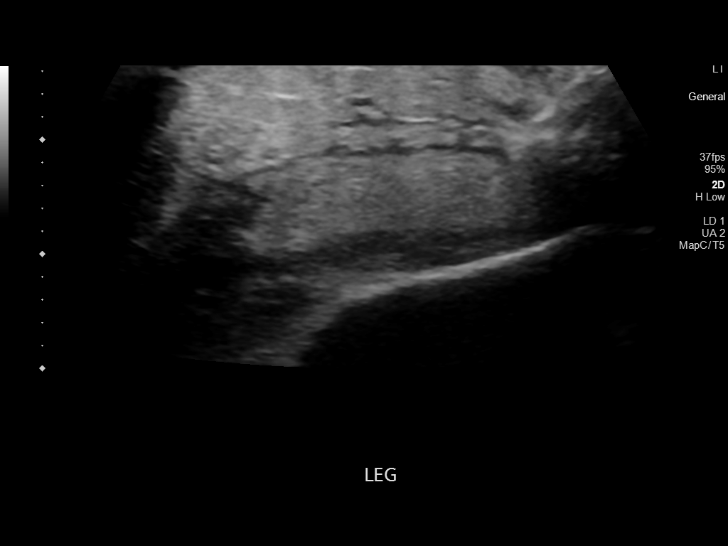
[im 12/20]
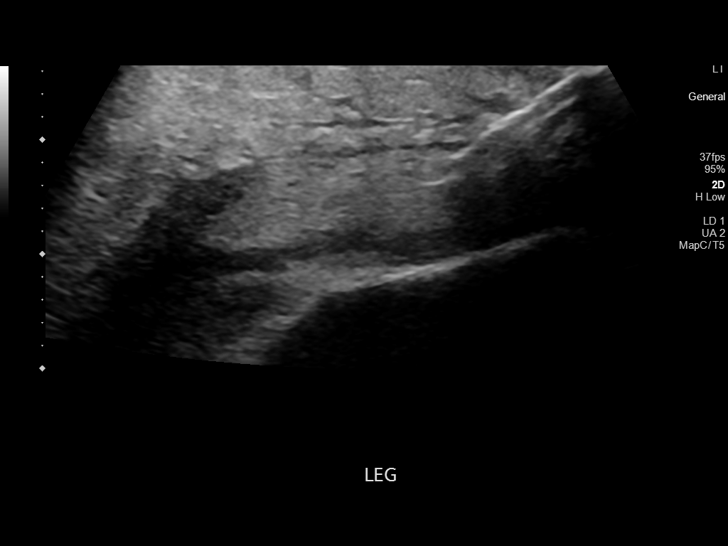
[im 14/20]
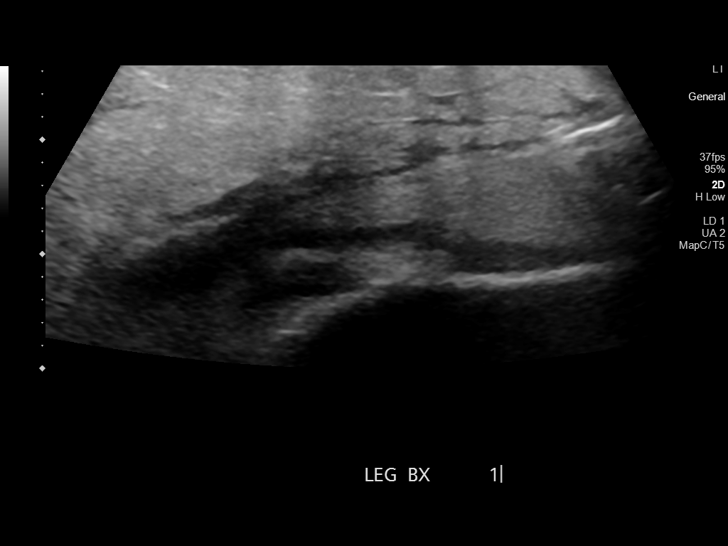
[im 15/20]
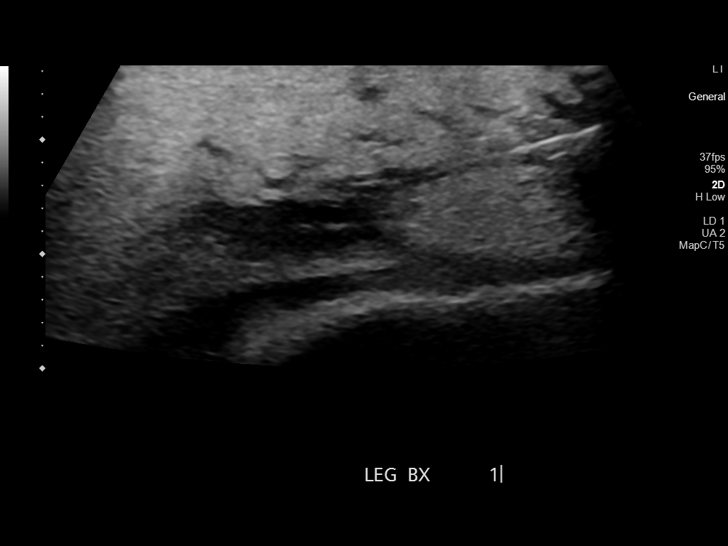
[im 17/20]
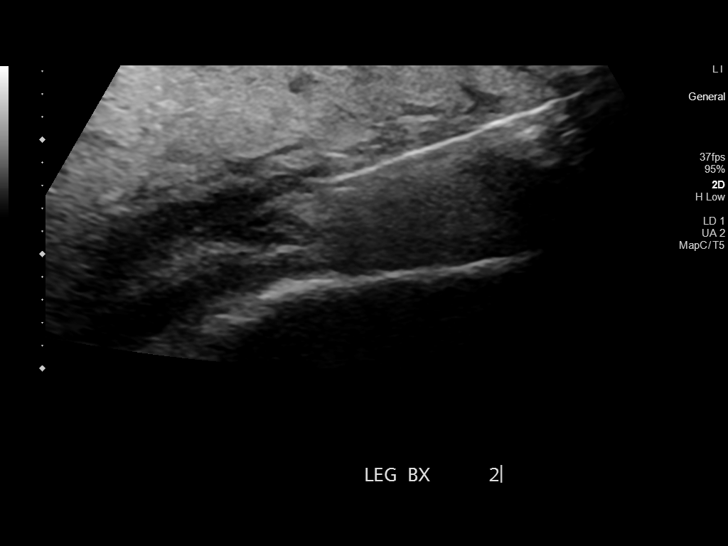
[im 18/20]
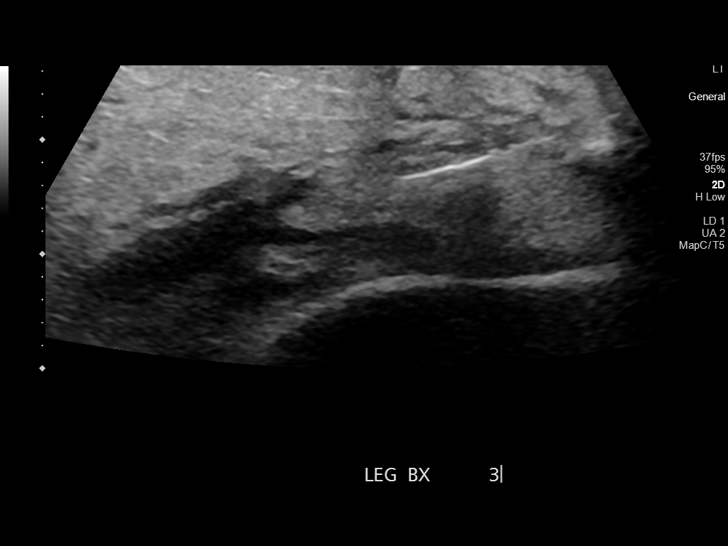
[im 20/20]
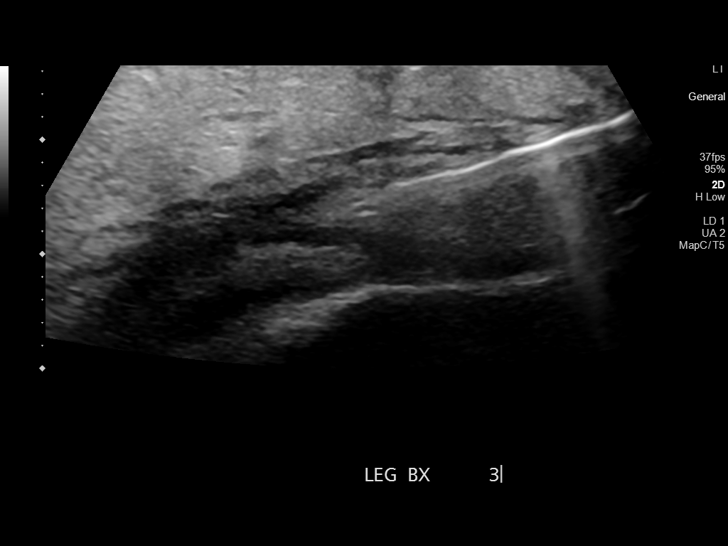

[13 of 20 positions shown; findings below may reference images not displayed]

EXAM:
Ultrasound-guided biopsy of inflamed tissues anterior to left tibia.

MEDICATIONS:
None.

ANESTHESIA/SEDATION:
Moderate (conscious) sedation was employed during this procedure. A
total of Versed 2 mg and Fentanyl 100 mcg was administered
intravenously.

Moderate Sedation Time: 10 minutes. [name] level of
consciousness and vital signs were monitored continuously by
radiology nursing throughout the procedure under my direct
supervision.

COMPLICATIONS:
None immediate.

PROCEDURE:
Informed written consent was obtained from the patient after a
thorough discussion of the procedural risks, benefits and
alternatives. All questions were addressed. Maximal Sterile Barrier
Technique was utilized including caps, mask, sterile gowns, sterile
gloves, sterile drape, hand hygiene and skin antiseptic. A timeout
was performed prior to the initiation of the procedure.

Patient position supine on the ultrasound table.

Right anterior lower leg skin prepped and draped in usual sterile
fashion.

Following local lidocaine administration, 17 gauge introducer needle
was advanced into the abnormal tissues anterior to the proximal
tibia. No fluid could be aspirated.Three 18 gauge cores were
obtained utilizing continuous ultrasound guidance.

Samples were sent to pathology in sterile saline.

Needle removed and hemostasis achieved with 2 minutes of manual
compression.

Post procedure ultrasound images showed no evidence of significant
hemorrhage.
IMPRESSION: Ultrasound-guided biopsy of inflamed soft tissues anterior to
abnormal appearing tibia (suspicious for osteomyelitis). Three cores
were sent in sterile saline for Gram stain and culture.

## 2020-07-12 MED ORDER — MIDAZOLAM HCL 2 MG/2ML IJ SOLN
INTRAMUSCULAR | Status: AC
  Filled 2020-07-12: qty 4

## 2020-07-12 MED ORDER — FAMOTIDINE 20 MG PO TABS
20.0000 mg | ORAL_TABLET | Freq: Two times a day (BID) | ORAL | Status: DC
  Administered 2020-07-12 – 2020-07-18 (×9): 20 mg via ORAL
  Filled 2020-07-12 (×10): qty 1

## 2020-07-12 MED ORDER — IRBESARTAN 150 MG PO TABS
150.0000 mg | ORAL_TABLET | Freq: Every evening | ORAL | Status: DC
  Administered 2020-07-12 – 2020-07-18 (×7): 150 mg via ORAL
  Filled 2020-07-12 (×7): qty 1

## 2020-07-12 MED ORDER — ENOXAPARIN SODIUM 40 MG/0.4ML ~~LOC~~ SOLN
40.0000 mg | SUBCUTANEOUS | Status: DC
  Administered 2020-07-15 – 2020-07-17 (×3): 40 mg via SUBCUTANEOUS
  Filled 2020-07-12 (×5): qty 0.4

## 2020-07-12 MED ORDER — FAMOTIDINE 20 MG PO TABS: 20.0000 mg | ORAL_TABLET | Freq: Two times a day (BID) | ORAL | Status: DC

## 2020-07-12 MED ORDER — OXYCODONE HCL 5 MG PO TABS
5.0000 mg | ORAL_TABLET | ORAL | Status: DC | PRN
Start: 2020-07-12 — End: 2020-07-15
  Administered 2020-07-12 – 2020-07-15 (×8): 5 mg via ORAL
  Filled 2020-07-12 (×9): qty 1

## 2020-07-12 MED ORDER — LIDOCAINE HCL 1 % IJ SOLN
INTRAMUSCULAR | Status: AC
  Filled 2020-07-12: qty 20

## 2020-07-12 MED ORDER — FENTANYL CITRATE (PF) 100 MCG/2ML IJ SOLN
INTRAMUSCULAR | Status: AC
  Filled 2020-07-12: qty 2

## 2020-07-12 MED ORDER — LIDOCAINE HCL (PF) 1 % IJ SOLN
INTRAMUSCULAR | Status: AC | PRN
  Administered 2020-07-12: 5 mL

## 2020-07-12 NOTE — Progress Notes (Signed)
[facility]  PROGRESS NOTE  Lindsay French YBW:[id number] DOB: [birth year] DOA: 07/11/2020 PCP: Adrienne Mocha, PA   Brief HPI:   40 year old female with medical history of ADHD, anxiety, depression, asthma, fatty liver disease, elevated LFTs, GERD, history of heatstroke with syncopal episode, thrombocytopenia, HIV on antiretroviral therapy, hyperlipidemia, hypertension who was diagnosed with AVN on 02/2020 came to ED after she was referred by ID due to exacerbation of pain of left pretibial area with edema and erythema.  MRI ordered by ID Dr. Ninetta Lights showed complex fluid collection with marrow edema suspicious for acute on chronic AVN with infectious complex.  She was started on cephalexin 500 mg p.o. 4 times a day.  When she was sent to the hospital for possible biopsy of the area.  Orthopedic surgery was consulted.    Subjective   Patient seen and examined, continues to have intermittent left lower extremity pain.  She was seen by orthopedic surgeon this morning, Dr. Shon Baton recommended biopsy per IR and also was going to contact Dr. Lajoyce Corners.   Assessment/Plan:     1. Acute on chronic AVN with infectious complex-MRI of left lower extremity performed as outpatient shows avascular necrosis in distal femoral metaphysis with surrounding edema and mild periostitis,?  osteomyelitis.  Orthopedic surgery has been consulted, Dr. Shon Baton has seen the patient and he has asked Dr. Lajoyce Corners to see the patient.  We will follow orthopedics recommendations.  2. Cellulitis of left lower extremity-patient has been on Keflex five 1 mg 4 times a day as outpatient.  The antibiotics were held for biopsy.  As biopsy has been done, will restart antibiotics. 3. HIV-continue antiretroviral therapy 4. GERD-continue famotidine 5. Thrombocytosis-likely in the setting of infectious process as above.  Continue to monitor patient's platelet count. 6. Hypertension-continue irbesartan 7. Hypothyroidism-patient is no longer on  Synthroid, TSH 4.88.   Scheduled medications:   . amphetamine-dextroamphetamine  20 mg Oral q morning  . vitamin C  1,000 mg Oral Daily  . busPIRone  15 mg Oral BID  . docusate sodium  100 mg Oral Daily  . dolutegravir  50 mg Oral Daily  . enoxaparin (LOVENOX) injection  40 mg Subcutaneous Q24H  . famotidine  20 mg Oral BID  . fentaNYL      . gabapentin  300 mg Oral QHS  . irbesartan  150 mg Oral QPM  . lamiVUDine  300 mg Oral Daily  . lidocaine      . melatonin  15 mg Oral QHS  . midazolam      . polyethylene glycol  17 g Oral Daily  . venlafaxine XR  225 mg Oral QPM         Data Reviewed:   CBG:  No results for input(s): GLUCAP in the last 168 hours.  SpO2: 99 % O2 Flow Rate (L/min): 2 L/min    Vitals:   07/12/20 1456 07/12/20 1506 07/12/20 1510 07/12/20 1528  BP: 116/77 120/78 122/78 109/73  Pulse: 72 74 71 77  Resp: 19 12 16 16   Temp:    98.4 F (36.9 C)  TempSrc:    Oral  SpO2: 100% 99% 100% 99%  Height:         Intake/Output Summary (Last 24 hours) at 07/12/2020 1545 Last data filed at 07/12/2020 1500 Gross per 24 hour  Intake 1260.27 ml  Output --  Net 1260.27 ml    03/20 1901 - 03/22 0700 In: 551.4 [P.O.:120; I.V.:431.4] Out: -   There were no vitals filed  for this visit.  CBC:  Recent Labs  Lab 07/11/20 1812 07/11/20 2219 07/12/20 0311  WBC 9.6 10.4 8.3  HGB 10.4* 10.1* 9.4*  HCT 33.3* 32.4* 29.9*  PLT 466* 473* 428*  MCV 109.9* 110.6* 110.7*  MCH 34.3* 34.5* 34.8*  MCHC 31.2 31.2 31.4  RDW 14.3 14.6 14.4  LYMPHSABS 1.5  --   --   MONOABS 0.9  --   --   EOSABS 0.0  --   --   BASOSABS 0.0  --   --     Complete metabolic panel:  Recent Labs  Lab 07/11/20 1812 07/11/20 2219 07/12/20 0311  NA 134* 133* 134*  K 3.8 3.8 3.8  CL 96* 94* 98  CO2 28 27 28   GLUCOSE 97 117* 100*  BUN <5* 6 7  CREATININE 0.61 0.68 0.61  CALCIUM 8.7* 8.6* 8.5*  AST  --  27  26 21   ALT  --  23  23 21   ALKPHOS  --  115  113 103  BILITOT   --  0.8  0.6 0.7  ALBUMIN  --  3.2*  3.1* 2.9*  MG  --  2.0  --   INR  --  1.1  --   TSH  --  4.888*  --     No results for input(s): LIPASE, AMYLASE in the last 168 hours.  Recent Labs  Lab 07/11/20 1833  SARSCOV2NAA NEGATIVE    ------------------------------------------------------------------------------------------------------------------ No results for input(s): CHOL, HDL, LDLCALC, TRIG, CHOLHDL, LDLDIRECT in the last 72 hours.  No results found for: HGBA1C ------------------------------------------------------------------------------------------------------------------ Recent Labs    07/11/20 2219  TSH 4.888*   ------------------------------------------------------------------------------------------------------------------ Recent Labs    07/11/20 2219  VITAMINB12 282  FOLATE 6.5    Coagulation profile  Recent Labs  Lab 07/11/20 2219  INR 1.1    No results for input(s): DDIMER in the last 72 hours.  Cardiac Enzymes  No results for input(s): CKMB, TROPONINI, MYOGLOBIN in the last 168 hours.  Invalid input(s): CK ------------------------------------------------------------------------------------------------------------------ No results found for: BNP   Antibiotics: Anti-infectives (From admission, onward)   Start     Dose/Rate Route Frequency Ordered Stop   07/12/20 1000  dolutegravir (TIVICAY) tablet 50 mg        50 mg Oral Daily 07/11/20 2142     07/12/20 1000  lamiVUDine (EPIVIR) tablet 300 mg        300 mg Oral Daily 07/11/20 2142     07/11/20 2115  Dolutegravir-lamiVUDine 50-300 MG TABS 1 tablet  Status:  Discontinued        1 tablet Oral Daily 07/11/20 2114 07/11/20 2141       Radiology Reports  No results found.    DVT prophylaxis: Lovenox  Code Status: Full code  Family Communication: No family at bedside   Consultants:  Infectious disease  Orthopedics  Procedures:      Objective    Physical  Examination:    General-appears in no acute distress  Heart-S1-S2, regular, no murmur auscultated  Lungs-clear to auscultation bilaterally, no wheezing or crackles auscultated  Abdomen-soft, nontender, no organomegaly  Extremities-no edema in the lower extremities  Neuro-alert, oriented x3, no focal deficit noted   Status is: Inpatient  Dispo: The patient is from: Home              Anticipated d/c is to: Home              Anticipated d/c date is: 07/15/2020  Patient currently not stable for discharge  Barrier to discharge-treatment for left labial abscess  COVID-19 Labs  No results for input(s): DDIMER, FERRITIN, LDH, CRP in the last 72 hours.  Lab Results  Component Value Date   SARSCOV2NAA NEGATIVE 07/11/2020    Microbiology  Recent Results (from the past 240 hour(s))  Resp Panel by RT-PCR (Flu A&B, Covid) Nasopharyngeal Swab     Status: None   Collection Time: 07/11/20  6:33 PM   Specimen: Nasopharyngeal Swab; Nasopharyngeal(NP) swabs in vial transport medium  Result Value Ref Range Status   SARS Coronavirus 2 by RT PCR NEGATIVE NEGATIVE Final    Comment: (NOTE) SARS-CoV-2 target nucleic acids are NOT DETECTED.  The SARS-CoV-2 RNA is generally detectable in upper respiratory specimens during the acute phase of infection. The lowest concentration of SARS-CoV-2 viral copies this assay can detect is 138 copies/mL. A negative result does not preclude SARS-Cov-2 infection and should not be used as the sole basis for treatment or other patient management decisions. A negative result may occur with  improper specimen collection/handling, submission of specimen other than nasopharyngeal swab, presence of viral mutation(s) within the areas targeted by this assay, and inadequate number of viral copies(<138 copies/mL). A negative result must be combined with clinical observations, patient history, and epidemiological information. The expected result is  Negative.  Fact Sheet for Patients:  BloggerCourse.com  Fact Sheet for Healthcare Providers:  SeriousBroker.it  This test is no t yet approved or cleared by the [place] FDA and  has been authorized for detection and/or diagnosis of SARS-CoV-2 by FDA under an Emergency Use Authorization (EUA). This EUA will remain  in effect (meaning this test can be used) for the duration of the COVID-19 declaration under Section 564(b)(1) of the Act, 21 U.S.C.section 360bbb-3(b)(1), unless the authorization is terminated  or revoked sooner.       Influenza A by PCR NEGATIVE NEGATIVE Final   Influenza B by PCR NEGATIVE NEGATIVE Final    Comment: (NOTE) The Xpert Xpress SARS-CoV-2/FLU/RSV plus assay is intended as an aid in the diagnosis of influenza from Nasopharyngeal swab specimens and should not be used as a sole basis for treatment. Nasal washings and aspirates are unacceptable for Xpert Xpress SARS-CoV-2/FLU/RSV testing.  Fact Sheet for Patients: BloggerCourse.com  Fact Sheet for Healthcare Providers: SeriousBroker.it  This test is not yet approved or cleared by the [place] FDA and has been authorized for detection and/or diagnosis of SARS-CoV-2 by FDA under an Emergency Use Authorization (EUA). This EUA will remain in effect (meaning this test can be used) for the duration of the COVID-19 declaration under Section 564(b)(1) of the Act, 21 U.S.C. section 360bbb-3(b)(1), unless the authorization is terminated or revoked.  Performed at [facility], [address], [place], [place] [address]              Meredeth Ide   [facility] If 7PM-7AM, please contact night-coverage at [email or web address], Office  [phone]   07/12/2020, 3:45 PM  LOS: 0 days

## 2020-07-12 NOTE — Consult Note (Signed)
Chief Complaint: Left lower extremity pain and redness.  AVN of the tibia. History: Lindsay French is a 41 y.o. female with a past medical history of HIV, last CD4 count 540 and viral load undetectable who presents the emergency department with left leg pain and swelling.  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  She has had progressively worsening pain, swelling and redness of the left lower extremity with progressive development of erythema of the surrounding tissue over the last month.  She has severe pain in the leg and is on chronic pain medication for this.  She had an MRI performed in the outpatient setting ordered by Dr. Enedina Finner who manages her chronic infectious disease.  She was found to have complex fluid collection with marrow edema concerning and suspicious for acute on chronic AVN with infectious complex.  She was admitted by Dr. Ninetta Lights but will need surgical consult and aspiration of fluid.  Patient currently complains of pain.  She denies fevers or chills.  She has not taken her antibiotics today and according to note written by Dr. Ninetta Lights he asks that we refrain from abx until the aspiration is  Completed.   Review of systems: No history of nausea, vomiting, emesis.  No syncopal episodes.  She is alert and oriented x3.  Past Medical History:  Diagnosis Date  . ADHD   . Anxiety   . Asthma    usually with colds  . Depression   . Elevated LFTs   . Fatty liver 04/16/2016   Noted [place] ABD  . GERD (gastroesophageal reflux disease)   . Heart murmur    at birth, no issues  . History of heat stroke    with syncope  . History of thrombocytopenia    prior to diagnosis HIV  . HIV (human immunodeficiency virus infection) (HCC)   . Hyperlipidemia   . Hypertension   . Hypothyroidism   . Obese   . PCOS (polycystic ovarian syndrome)   . Pneumonia 04/17/2016  . Wears partial dentures    upper  . Wrist fracture 11/2017   Left     Allergies  Allergen Reactions  . Guaifenesin Nausea And Vomiting  . Hydrocodone Itching    hot  . Lactose Intolerance (Gi) Other (See Comments)    Upset stomach  . Latex Other (See Comments)    As a teenager when using latex condoms she would get a yeast infection.     No current facility-administered medications on file prior to encounter.   Current Outpatient Medications on File Prior to Encounter  Medication Sig Dispense Refill  . albuterol (VENTOLIN HFA) 108 (90 Base) MCG/ACT inhaler Inhale 1 puff into the lungs every 6 (six) hours as needed for wheezing or shortness of breath.    . ALPRAZolam (XANAX) 0.5 MG tablet Take 0.5 mg by mouth 2 (two) times daily as needed for anxiety.     Marland Kitchen amphetamine-dextroamphetamine (ADDERALL XR) 20 MG 24 hr capsule Take 20 mg by mouth every morning.    . Ascorbic Acid (VITAMIN C) 1000 MG tablet Take 1,000 mg by mouth daily.    . busPIRone (BUSPAR) 15 MG tablet Take 15 mg by mouth 2 (two) times daily.    . celecoxib (CELEBREX) 200 MG capsule Take 1 capsule (200 mg total) by mouth 2 (two) times daily. 20 capsule 0  . cephALEXin (KEFLEX) 500 MG capsule Take 1 capsule (500 mg total) by mouth 4 (four)  times daily. (Patient taking differently: Take 500 mg by mouth 4 (four) times daily. Start date :07/05/20) 30 capsule 1  . Cyanocobalamin (VITAMIN B 12 PO) Take 1,000 mcg by mouth daily.    Marland Kitchen docusate sodium (COLACE) 100 MG capsule Take 100 mg by mouth daily.    Marland Kitchen [name] 50-300 MG TABS TAKE 1 TABLET BY MOUTH DAILY 30 tablet 3  . gabapentin (NEURONTIN) 100 MG capsule Take 300 mg by mouth at bedtime.    . irbesartan (AVAPRO) 150 MG tablet Take 150 mg by mouth every evening.    . melatonin 5 MG TABS Take 15 mg by mouth at bedtime.    Marland Kitchen morphine (MS CONTIN) 15 MG 12 hr tablet Take 15-30 mg by mouth See admin instructions. Takes 1 tablet in the morning, 1 tablet at noon and 2 tablets at night    . OVER THE COUNTER MEDICATION Take 400 mcg by mouth daily.  Methyl Folate 400 mcg    . OVER THE COUNTER MEDICATION Take 50 mg by mouth daily. P-5-P    . oxyCODONE (OXY IR/ROXICODONE) 5 MG immediate release tablet Take 5 mg by mouth every 6 (six) hours as needed for severe pain.    . polyethylene glycol (MIRALAX / GLYCOLAX) 17 g packet Take 17 g by mouth daily.    Marland Kitchen venlafaxine XR (EFFEXOR-XR) 75 MG 24 hr capsule Take 225 mg by mouth every evening.    Marland Kitchen MELATONIN PO Take 1 tablet by mouth at bedtime as needed (sleep).      Physical Exam: Vitals:   07/12/20 0146 07/12/20 0614  BP: 106/68 114/68  Pulse: 80 62  Resp: 16 17  Temp: 97.9 F (36.6 C) 97.6 F (36.4 C)  SpO2: 93% 99%   Body mass index is 40.17 kg/m. She is alert and oriented x3.   No shortness of breath, chest pain  Abdomen is soft and nontender, no rebound tenderness, no incontinence of bowel and bladder Full range of motion the upper extremity with shoulder, elbow, wrist pain with the joint range of motion. Left lower extremity: Redness and streaking noted on the anterior aspect of the tibia.  Patient has pain and tenderness with range of motion of the knee and ankle.  Compartments of the lower extremity are soft and nontender.  2+ dorsalis pedis/posterior tibialis pulse bilaterally.  Patient has no significant pain with passive range of motion of the hip. Right lower extremity: full range of motion, no swelling or erythema is noted. No focal neurological deficits on motor or sensory testing in the lower extremity.  Negative Babinski test, negative nerve root tension signs in the lower extremities.   Image: MR TIBIA FIBULA LEFT W WO CONTRAST  Result Date: 07/08/2020 CLINICAL DATA:  Avascular necrosis, ITP.  Pain and erythema. EXAM: MRI OF LOWER LEFT EXTREMITY WITHOUT AND WITH CONTRAST TECHNIQUE: Multiplanar, multisequence MR imaging of the left tibia/fibula was performed both before and after administration of intravenous contrast. CONTRAST:  71mL GADAVIST GADOBUTROL 1 MMOL/ML IV  SOLN COMPARISON:  None. FINDINGS: Bones/Joint/Cartilage Chronic appearing avascular necrosis in the right proximal tibial metaphysis. On the left side, there is a larger (11.4 by 3.6 by 3.1 cm) region of involvement in the proximal metaphysis with surrounding marrow edema, accentuated enhancement, periostitis, and abnormal edema and enhancement in the surrounding soft tissues including the muscular tissues. This is somewhat similar to the process on the contralateral side, with apparent potentially reflecting acute on chronic infarct or superinfection along prior AVN. Overall this is  a complex appearance and accordingly difficult to classify, although the presence of high T1 signal within the main lesion tends to suggest some revascularization of chronic AVN. There is some serpentine scalloped appearance of the lower margins with associated endosteal narrowing for example on image 27 of series 14, with overlying cortical thickening and potentially a small fluid collection, which is an unusual appearance making it difficult to exclude the possibility of superimposed infection. Ligaments N/A Muscles and Tendons Abnormal edema and enhancement in the musculature adjacent to the proximal tibia including the tibialis anterior, proximal peroneus longus, and popliteus muscles. Soft tissues Small complex collection in the deep subcutaneous tissues adjacent to a region of cortical thinning, questionable communication with the bone, measuring about 3.0 by 1.0 by 3.3 cm. This does have some high internal T1 signal for example on image 23 of series 14 and accordingly could represent hematoma rather than infection. IMPRESSION: 1. Complex appearance with suspected bilateral proximal tibial metaphysis avascular necrosis, but with extensive left-sided proximal tibia marrow edema, endosteal scalloping, possible subtle cortical breakthrough, and complex adjacent fluid along the proximal tibial lesion raising a greater degree of  concern for infection. Acute on chronic infarct with periosteal hematoma is a differential diagnostic consideration. Correlate with fever/leukocytosis. Electronically Signed   By: Gaylyn RongWalter  Liebkemann M.D.   On: 07/08/2020 17:32   MR FEMUR LEFT W WO CONTRAST  Result Date: 07/08/2020 CLINICAL DATA:  Upper leg pain and tenderness EXAM: MR OF THE LEFT LOWER EXTREMITY WITHOUT AND WITH CONTRAST TECHNIQUE: Multiplanar, multisequence MR imaging of the left femur was performed both before and after administration of intravenous contrast. CONTRAST:  10mL GADAVIST GADOBUTROL 1 MMOL/ML IV SOLN COMPARISON:  None. FINDINGS: Bones/Joint/Cartilage Low T1 signal in the shaft of the femur possibly reflecting reactive marrow findings from the patient's high TP. 8.5 by 4.5 by 2.0 cm region of avascular necrosis in the distal femoral metaphysis with surrounding edema and potentially some mild periostitis suggesting a potential acute component. Given the edema and surrounding periostitis as well as the surrounding soft tissue edema around the distal femur, the possibility of chronic osteomyelitis is also raised, but is considered less likely given the central high T1 signal in this process which probably represents revascularization in some fatty marrow, which would be unusual in the setting of pure osteomyelitis. There is an adjacent small knee joint effusion but without thick enhancing synovium to suggest a septic joint. Another factor in favor of avascular necrosis is the presence of other findings of AVN in the proximal tibia bilaterally on additional tibial imaging performed under separate accession. Ligaments N/A Muscles and Tendons Low-grade edema tracks along the distal margins of the vastus medialis and vastus lateralis muscles, and remains nonspecific. Soft tissues Mild subcutaneous edema primarily anteriorly along the thigh region. IMPRESSION: 1. 8.5 by 4.5 by 2.0 cm region of avascular necrosis in the distal femoral  metaphysis with surrounding edema and potentially some mild periostitis suggesting a potential acute infarct component. Given the edema and surrounding soft tissue edema around the distal femur, the possibility of chronic osteomyelitis is also raised, but is considered less likely given the central high T1 signal characteristic of revascularization and fatty marrow, which would be unusual in the setting of pure osteomyelitis. 2. Small knee joint effusion but without thick enhancing synovium to suggest a septic joint. 3. Low-grade edema tracks along the distal margins of the vastus medialis and vastus lateralis muscles, and remains nonspecific. 4. Mild subcutaneous edema anteriorly along the thigh region. Electronically  Signed   By: Gaylyn Rong M.D.   On: 07/08/2020 17:24    A/P: I prescribed therapy with was a 40 year old woman with multiple medical left calf swelling and pain.  Patient is ambulating but he notes that after standing or walking she developed significant pain.  This is been going on for some time now and she has been treated as an outpatient.  Because of worsening symptoms, and MRI findings she was recommended to be admitted for further treatment.  Imaging studies demonstrate a region of avascular necrosis of the distal femoral metaphysis with surrounding edema and potential some mild periostitis suggesting potential acute infarct component.  There is also a small knee joint effusion but no thickening or enhancement of the synovium.  MRI of the tibia demonstrates areas of avascular necrosis as well as a small fluid collection.  At this point the patient states the pain is significant.  I spoken with Dr. Ninetta Lights prior to the patient being admitted and I did inform him that unfortunately this is beyond the scope of my expertise.  I contacted Dr. Lajoyce Corners who is agreed to evaluate the patient and determine appropriate treatment plan as a relates to her orthopedic issues.  I will also arrange  for interventional radiology to do a biopsy so that we can better determine if this is an infection.  I will defer recommendations to Dr. Lajoyce Corners.

## 2020-07-12 NOTE — Consult Note (Signed)
Chief Complaint: Patient was seen in consultation today for image guided aspiration of anterior left lower leg subcutaneous fluid collection Chief Complaint  Patient presents with  . Leg Pain    Referring Physician(s): Brooks,D/Hatcher,J  Supervising Physician: Mir, Secondary school teacher  Patient Status: [facility] - In-pt  History of Present Illness: Lindsay French is a 40 y.o. female with medical history significant for ADHD, anxiety, depression, asthma, fatty liver disease, elevated LFT, GERD, history of heatstroke with syncopal episode, thrombocytopenia before HIV diagnosis, HIV on antiretroviral therapy, hyperlipidemia, hypertension, hypothyroidism, class III obesity, polycystic ovarian syndrome, history of pneumonia, diagnosed with AVN on 02/2020. She  was admitted to [facility] on 3/21 after being referred by her ID specialist (Dr. Johny Sax) for exacerbation of pain of left pretibial area associated with edema and erythema with MRI ordered by Dr. Ninetta Lights and performed on Friday demonstrated complex fluid collection with marrow edema suspicious for acute on chronic AVN with infectious complex.  She has been taking cephalexin 500 mg p.o. 4 times a day, but Dr. Ninetta Lights recommended to avoid antibiotic use until a biopsy is performed.  Request now received for image guided aspiration of the anterior left lower leg subcutaneous fluid collection.  Past Medical History:  Diagnosis Date  . ADHD   . Anxiety   . Asthma    usually with colds  . Depression   . Elevated LFTs   . Fatty liver 04/16/2016   Noted [place] ABD  . GERD (gastroesophageal reflux disease)   . Heart murmur    at birth, no issues  . History of heat stroke    with syncope  . History of thrombocytopenia    prior to diagnosis HIV  . HIV (human immunodeficiency virus infection) (HCC)   . Hyperlipidemia   . Hypertension   . Hypothyroidism   . Obese   . PCOS (polycystic ovarian syndrome)   . Pneumonia 04/17/2016  . Wears  partial dentures    upper  . Wrist fracture 11/2017   Left    Past Surgical History:  Procedure Laterality Date  . HYSTEROSCOPY N/A 12/31/2017   Procedure: HYSTEROSCOPY and polypectomy;  Surgeon: Fermin Schwab, MD;  Location: [facility];  Service: Gynecology;  Laterality: N/A;  . maxillofacial surgery      Allergies: Guaifenesin, Hydrocodone, Lactose intolerance (gi), and Latex  Medications: Prior to Admission medications   Medication Sig Start Date End Date Taking? Authorizing Provider  albuterol (VENTOLIN HFA) 108 (90 Base) MCG/ACT inhaler Inhale 1 puff into the lungs every 6 (six) hours as needed for wheezing or shortness of breath. 02/04/20  Yes [provider]  ALPRAZolam Prudy Feeler) 0.5 MG tablet Take 0.5 mg by mouth 2 (two) times daily as needed for anxiety.  12/21/15  Yes [provider]  amphetamine-dextroamphetamine (ADDERALL XR) 20 MG 24 hr capsule Take 20 mg by mouth every morning. 04/11/20  Yes [provider]  Ascorbic Acid (VITAMIN C) 1000 MG tablet Take 1,000 mg by mouth daily.   Yes [provider]  busPIRone (BUSPAR) 15 MG tablet Take 15 mg by mouth 2 (two) times daily. 06/20/20  Yes [provider]  celecoxib (CELEBREX) 200 MG capsule Take 1 capsule (200 mg total) by mouth 2 (two) times daily. 12/01/19  Yes Harris, Abigail, PA-C  cephALEXin (KEFLEX) 500 MG capsule Take 1 capsule (500 mg total) by mouth 4 (four) times daily. Patient taking differently: Take 500 mg by mouth 4 (four) times daily. Start date :07/05/20 07/05/20  Yes  Ginnie Smart, MD  Cyanocobalamin (VITAMIN B 12 PO) Take 1,000 mcg by mouth daily.   Yes [provider]  docusate sodium (COLACE) 100 MG capsule Take 100 mg by mouth daily.   Yes [provider]  [name] 50-300 MG TABS TAKE 1 TABLET BY MOUTH DAILY 07/01/20 09/29/20 Yes Ginnie Smart, MD  gabapentin (NEURONTIN) 100 MG capsule Take 300 mg by mouth at bedtime.   Yes  [provider]  irbesartan (AVAPRO) 150 MG tablet Take 150 mg by mouth every evening. 02/06/19  Yes [provider]  melatonin 5 MG TABS Take 15 mg by mouth at bedtime.   Yes [provider]  morphine (MS CONTIN) 15 MG 12 hr tablet Take 15-30 mg by mouth See admin instructions. Takes 1 tablet in the morning, 1 tablet at noon and 2 tablets at night 06/24/20  Yes [provider]  OVER THE COUNTER MEDICATION Take 400 mcg by mouth daily. Methyl Folate 400 mcg   Yes [provider]  OVER THE COUNTER MEDICATION Take 50 mg by mouth daily. P-5-P   Yes [provider]  oxyCODONE (OXY IR/ROXICODONE) 5 MG immediate release tablet Take 5 mg by mouth every 6 (six) hours as needed for severe pain.   Yes [provider]  polyethylene glycol (MIRALAX / GLYCOLAX) 17 g packet Take 17 g by mouth daily.   Yes [provider]  venlafaxine XR (EFFEXOR-XR) 75 MG 24 hr capsule Take 225 mg by mouth every evening. 12/21/15  Yes [provider]  MELATONIN PO Take 1 tablet by mouth at bedtime as needed (sleep).    [provider]     Family History  Problem Relation Age of Onset  . Hypothyroidism Mother   . Hypertension Father     Social History   Socioeconomic History  . Marital status: Married    Spouse name: Not on file  . Number of children: Not on file  . Years of education: Not on file  . Highest education level: Not on file  Occupational History  . Not on file  Tobacco Use  . Smoking status: Former Smoker    Packs/day: 0.30    Years: 15.00    Pack years: 4.50    Types: Cigarettes  . Smokeless tobacco: Never Used  Vaping Use  . Vaping Use: Never used  Substance and Sexual Activity  . Alcohol use: Yes    Alcohol/week: 7.0 standard drinks    Types: 7 Glasses of wine per week    Comment: wine  2-3 times a week  . Drug use: Not Currently    Types: "Crack" cocaine    Comment: Previous hx of crack use - clean for  7 years   . Sexual activity: Not Currently    Partners: Male    Birth control/protection: None    Comment: try to conceive   Other Topics Concern  . Not on file  Social History Narrative   ** Merged History Encounter **       Social Determinants of Health   Financial Resource Strain: Not on file  Food Insecurity: Not on file  Transportation Needs: Not on file  Physical Activity: Not on file  Stress: Not on file  Social Connections: Not on file      Review of Systems currently denies headache, chest pain, dyspnea, cough, abdominal/back pain, nausea, vomiting or bleeding.  She has had some recent temperature elevations and continues to have some left leg discomfort.  Vital Signs: BP 114/68 (BP Location: Left Wrist)   Pulse 62   Temp 97.6 F (36.4 C) (Oral)   Resp 17   Ht 5\' 4"  (1.626 m)   SpO2 99%   BMI 40.17 kg/m   Physical Exam awake, alert.  Chest clear to auscultation bilaterally.  Heart with regular rate and rhythm.  Abdomen obese, soft, positive bowel sounds, nontender.  She has some erythema, edema/firmness and streaking noted on anterior aspect of left tibial region along with blister formation; complains of discomfort from the left knee down to her foot; no RLE edema  Imaging: MR TIBIA FIBULA LEFT W WO CONTRAST  Result Date: 07/08/2020 CLINICAL DATA:  Avascular necrosis, ITP.  Pain and erythema. EXAM: MRI OF LOWER LEFT EXTREMITY WITHOUT AND WITH CONTRAST TECHNIQUE: Multiplanar, multisequence MR imaging of the left tibia/fibula was performed both before and after administration of intravenous contrast. CONTRAST:  73mL GADAVIST GADOBUTROL 1 MMOL/ML IV SOLN COMPARISON:  None. FINDINGS: Bones/Joint/Cartilage Chronic appearing avascular necrosis in the right proximal tibial metaphysis. On the left side, there is a larger (11.4 by 3.6 by 3.1 cm) region of involvement in the proximal metaphysis with surrounding marrow edema, accentuated enhancement, periostitis, and abnormal  edema and enhancement in the surrounding soft tissues including the muscular tissues. This is somewhat similar to the process on the contralateral side, with apparent potentially reflecting acute on chronic infarct or superinfection along prior AVN. Overall this is a complex appearance and accordingly difficult to classify, although the presence of high T1 signal within the main lesion tends to suggest some revascularization of chronic AVN. There is some serpentine scalloped appearance of the lower margins with associated endosteal narrowing for example on image 27 of series 14, with overlying cortical thickening and potentially a small fluid collection, which is an unusual appearance making it difficult to exclude the possibility of superimposed infection. Ligaments N/A Muscles and Tendons Abnormal edema and enhancement in the musculature adjacent to the proximal tibia including the tibialis anterior, proximal peroneus longus, and popliteus muscles. Soft tissues Small complex collection in the deep subcutaneous tissues adjacent to a region of cortical thinning, questionable communication with the bone, measuring about 3.0 by 1.0 by 3.3 cm. This does have some high internal T1 signal for example on image 23 of series 14 and accordingly could represent hematoma rather than infection. IMPRESSION: 1. Complex appearance with suspected bilateral proximal tibial metaphysis avascular necrosis, but with extensive left-sided proximal tibia marrow edema, endosteal scalloping, possible subtle cortical breakthrough, and complex adjacent fluid along the proximal tibial lesion raising a greater degree of concern for infection. Acute on chronic infarct with periosteal hematoma is a differential diagnostic consideration. Correlate with fever/leukocytosis. Electronically Signed   By: 9m M.D.   On: 07/08/2020 17:32   MR FEMUR LEFT W WO CONTRAST  Result Date: 07/08/2020 CLINICAL DATA:  Upper leg pain and tenderness  EXAM: MR OF THE LEFT LOWER EXTREMITY WITHOUT AND WITH CONTRAST TECHNIQUE: Multiplanar, multisequence MR imaging of the left femur was performed both before and after administration of intravenous contrast. CONTRAST:  65mL GADAVIST GADOBUTROL 1 MMOL/ML IV SOLN COMPARISON:  None. FINDINGS: Bones/Joint/Cartilage Low T1 signal in the shaft of the femur possibly reflecting reactive marrow findings from the patient's high TP. 8.5 by 4.5 by 2.0 cm region of avascular necrosis in the distal femoral metaphysis with surrounding edema and potentially some mild periostitis suggesting a potential acute component. Given the edema and surrounding periostitis as well as the surrounding soft tissue edema  around the distal femur, the possibility of chronic osteomyelitis is also raised, but is considered less likely given the central high T1 signal in this process which probably represents revascularization in some fatty marrow, which would be unusual in the setting of pure osteomyelitis. There is an adjacent small knee joint effusion but without thick enhancing synovium to suggest a septic joint. Another factor in favor of avascular necrosis is the presence of other findings of AVN in the proximal tibia bilaterally on additional tibial imaging performed under separate accession. Ligaments N/A Muscles and Tendons Low-grade edema tracks along the distal margins of the vastus medialis and vastus lateralis muscles, and remains nonspecific. Soft tissues Mild subcutaneous edema primarily anteriorly along the thigh region. IMPRESSION: 1. 8.5 by 4.5 by 2.0 cm region of avascular necrosis in the distal femoral metaphysis with surrounding edema and potentially some mild periostitis suggesting a potential acute infarct component. Given the edema and surrounding soft tissue edema around the distal femur, the possibility of chronic osteomyelitis is also raised, but is considered less likely given the central high T1 signal characteristic of  revascularization and fatty marrow, which would be unusual in the setting of pure osteomyelitis. 2. Small knee joint effusion but without thick enhancing synovium to suggest a septic joint. 3. Low-grade edema tracks along the distal margins of the vastus medialis and vastus lateralis muscles, and remains nonspecific. 4. Mild subcutaneous edema anteriorly along the thigh region. Electronically Signed   By: Gaylyn RongWalter  Liebkemann M.D.   On: 07/08/2020 17:24    Labs:  CBC: Recent Labs    03/10/20 1514 07/11/20 1812 07/11/20 2219 07/12/20 0311  WBC 7.9 9.6 10.4 8.3  HGB 15.3 10.4* 10.1* 9.4*  HCT 44.0 33.3* 32.4* 29.9*  PLT 203 466* 473* 428*    COAGS: Recent Labs    07/11/20 2219  INR 1.1    BMP: Recent Labs    11/29/19 2255 03/10/20 1514 07/11/20 1812 07/11/20 2219 07/12/20 0311  NA 136 135 134* 133* 134*  K 3.6 3.8 3.8 3.8 3.8  CL 98 96* 96* 94* 98  CO2 25 27 28 27 28   GLUCOSE 148* 115* 97 117* 100*  BUN 6 6* <5* 6 7  CALCIUM 8.7* 9.4 8.7* 8.6* 8.5*  CREATININE 0.46 0.59 0.61 0.68 0.61  GFRNONAA >60 116 >60 >60 >60  GFRAA >60 135  --   --   --     LIVER FUNCTION TESTS: Recent Labs    03/10/20 1514 07/11/20 2219 07/12/20 0311  BILITOT 1.5* 0.8  0.6 0.7  AST 72* 27  26 21   ALT 55* 23  23 21   ALKPHOS  --  115  113 103  PROT 7.0 7.4  7.4 6.5  ALBUMIN  --  3.2*  3.1* 2.9*    TUMOR MARKERS: No results for input(s): AFPTM, CEA, CA199, CHROMGRNA in the last 8760 hours.  Assessment and Plan: 40 y.o. female with medical history significant for ADHD, anxiety, depression, asthma, fatty liver disease, elevated LFT, GERD, history of heatstroke with syncopal episode, thrombocytopenia before HIV diagnosis, HIV on antiretroviral therapy, hyperlipidemia, hypertension, hypothyroidism, class III obesity, polycystic ovarian syndrome, history of pneumonia, diagnosed with AVN on 02/2020. She  was admitted to [facility] on 3/21 after being referred by her ID  specialist (Dr. Johny SaxJeffrey Hatcher) for exacerbation of pain of left pretibial area associated with edema and erythema with MRI ordered by Dr. Ninetta LightsHatcher and performed on Friday demonstrated complex fluid collection with marrow edema suspicious for acute  on chronic AVN with infectious complex.  She has been taking cephalexin 500 mg p.o. 4 times a day, but Dr. Ninetta Lights recommended to avoid antibiotic use until a biopsy is performed.  Request now received for image guided aspiration of the anterior left lower leg subcutaneous fluid collection.  Latest imaging studies have been reviewed by Dr. Bryn Gulling.  Details/risks of procedure, including but not limited to, internal bleeding, infection, injury to adjacent structures discussed with patient with her understanding and consent.  Procedure scheduled for later today.   Thank you for this interesting consult.  I greatly enjoyed meeting Lindsay French and look forward to participating in their care.  A copy of this report was sent to the requesting provider on this date.  Electronically Signed: D. Jeananne Rama, PA-C 07/12/2020, 10:11 AM   I spent a total of 25 minutes   in face to face in clinical consultation, greater than 50% of which was counseling/coordinating care for image guided aspiration of left anterior lower leg subcutaneous fluid collection

## 2020-07-12 NOTE — Progress Notes (Signed)
MEDICATION-RELATED CONSULT NOTE   IR Procedure Consult - Anticoagulant/Antiplatelet PTA/Inpatient Med List Review by Pharmacist    Procedure: [place] FNA soft tissue    Completed: 3/22 ~ 1500  Post-Procedural bleeding risk per IR MD assessment:  Low  Antithrombotic medications on inpatient or PTA profile prior to procedure:    Lovenox 40 mg   Recommended restart time per IR Post-Procedure Guidelines:   Day 0 + at least 4 hours  Other considerations:    EBL < 10 ml  Plan:     Resume Lovenox 40 mg daily this PM at 2200.   Thank you for allowing pharmacy to participate in this patient's care.    Luisa Hart D  07/12/20 3:28 PM

## 2020-07-12 NOTE — Procedures (Signed)
Interventional Radiology Procedure Note  Procedure: Biopsy of inflamed tissue adjacent to the anterior cortex of left proximal tibia.  Indication: Left tibia osteomyelitis   Findings: Please refer to procedural dictation for full description.  Complications: None  EBL: < 10 mL  Acquanetta Belling, MD [phone]

## 2020-07-12 NOTE — Consult Note (Signed)
[facility]    Date of Admission:  07/11/2020   Total days of antibiotics: 0               Reason for Consult: HIV, AVN, ? Abscess    Referring Provider: [place]   Assessment: AVN L leg Cellulitis of LLE HIV Obesity  Plan: 1. Continue her ART 2. Await Bx today 3. Hold anbx til she has had bx 4. Pain mgmt  Comment- Her MRI's list AVN however this level of overlying erythema and tenderness is very unusual for this dx. Hopefully he BX will give us answers.  Her immune function is very good.  My great appreciation to IR and TRH.   HIV 1 RNA Quant  Date Value  03/10/2020 <20 Copies/mL  03/04/2019 38 copies/mL (H)  04/24/2018 25 copies/mL (H)   CD4 T Cell Abs (/uL)  Date Value  03/10/2020 545  03/04/2019 773  04/24/2018 640     Thank you so much for this interesting consult,  Principal Problem:   Avascular necrosis of bone (HCC) Active Problems:   ADHD (attention deficit hyperactivity disorder)   Class 3 obesity   Hypertension   Hyperlipidemia   Human immunodeficiency virus (HIV) disease (HCC)   Hypothyroidism   GERD (gastroesophageal reflux disease)   Hepatic steatosis   Thrombocytosis   Macrocytic anemia   . amphetamine-dextroamphetamine  20 mg Oral q morning  . vitamin C  1,000 mg Oral Daily  . busPIRone  15 mg Oral BID  . docusate sodium  100 mg Oral Daily  . dolutegravir  50 mg Oral Daily  . famotidine  20 mg Oral BID  . gabapentin  300 mg Oral QHS  . irbesartan  150 mg Oral QPM  . lamiVUDine  300 mg Oral Daily  . melatonin  15 mg Oral QHS  . polyethylene glycol  17 g Oral Daily  . venlafaxine XR  225 mg Oral QPM    HPI: Lindsay French is a 40 y.o. female 40yo F with HIV+ (dx 2008) prev on atripla -->truvada/prezcobix --->complera-->odefsy. She has had LLE pain since November.  I got a my chart message from Lindsay French this week that she had AVN ([place]) but also had erythema over her L shin. She has  been seen by [facility] and [facility].  I started her on keflex. 07-05-20 She has significant pain. Taking Morphine tid and oxycodone tid.  MRI at OSH: Geographic signal abnormality in the proximal tibia and distal fibular (partially imaged). Findings are similar nonspecific but most consistent with severe avascular necrosis. However, given degree of surrounding edema and periosteal reaction, unable to exclude infection or marrow infiltrative process (such as leukemia/lymphoma). Recommend correlation with laboratory data and any systemic sources predisposing patient to AVN (such as sickle cell disease). Depending on these findings, recommend short interval follow-up MRI which also includes the femur.   I saw her in ID f/u on 3-17- I sent her for urgent MRI (see results) due to worsening pain and swelling.  She continued on keflex with no improvement.  She states she had low grade f/c at home.  I reviewed her MRI on 3-18 and called her to come to hospital on 3-19 twice. She called me back on 3-21 and I encouraged her to come to the hospital due to the fluid collection on her MRI.  She is scheduled for IR BX this afternoon, her keflex was stopped.  Tm 100.0  Review of Systems: Review of Systems  Constitutional: Positive for chills, fever and malaise/fatigue.  Gastrointestinal: Negative for nausea and vomiting.  Genitourinary: Negative for dysuria.  Musculoskeletal: Positive for myalgias.  Please see HPI. All other systems reviewed and negative.   Past Medical History:  Diagnosis Date  . ADHD   . Anxiety   . Asthma    usually with colds  . Depression   . Elevated LFTs   . Fatty liver 04/16/2016   Noted [place] ABD  . GERD (gastroesophageal reflux disease)   . Heart murmur    at birth, no issues  . History of heat stroke    with syncope  . History of thrombocytopenia    prior to diagnosis HIV  . HIV (human immunodeficiency virus infection) (HCC)   . Hyperlipidemia   . Hypertension    . Hypothyroidism   . Obese   . PCOS (polycystic ovarian syndrome)   . Pneumonia 04/17/2016  . Wears partial dentures    upper  . Wrist fracture 11/2017   Left    Social History   Tobacco Use  . Smoking status: Former Smoker    Packs/day: 0.30    Years: 15.00    Pack years: 4.50    Types: Cigarettes  . Smokeless tobacco: Never Used  Vaping Use  . Vaping Use: Never used  Substance Use Topics  . Alcohol use: Yes    Alcohol/week: 7.0 standard drinks    Types: 7 Glasses of wine per week    Comment: wine  2-3 times a week  . Drug use: Not Currently    Types: "Crack" cocaine    Comment: Previous hx of crack use - clean for 7 years     Family History  Problem Relation Age of Onset  . Hypothyroidism Mother   . Hypertension Father      Medications:  Scheduled: . amphetamine-dextroamphetamine  20 mg Oral q morning  . vitamin C  1,000 mg Oral Daily  . busPIRone  15 mg Oral BID  . docusate sodium  100 mg Oral Daily  . dolutegravir  50 mg Oral Daily  . famotidine  20 mg Oral BID  . gabapentin  300 mg Oral QHS  . irbesartan  150 mg Oral QPM  . lamiVUDine  300 mg Oral Daily  . melatonin  15 mg Oral QHS  . polyethylene glycol  17 g Oral Daily  . venlafaxine XR  225 mg Oral QPM    Abtx:  Anti-infectives (From admission, onward)   Start     Dose/Rate Route Frequency Ordered Stop   07/12/20 1000  dolutegravir (TIVICAY) tablet 50 mg        50 mg Oral Daily 07/11/20 2142     07/12/20 1000  lamiVUDine (EPIVIR) tablet 300 mg        300 mg Oral Daily 07/11/20 2142     07/11/20 2115  Dolutegravir-lamiVUDine 50-300 MG TABS 1 tablet  Status:  Discontinued        1 tablet Oral Daily 07/11/20 2114 07/11/20 2141        OBJECTIVE: Blood pressure 114/68, pulse 62, temperature 97.6 F (36.4 C), temperature source Oral, resp. rate 17, height  (1.626 m), SpO2 99 %.  Physical Exam Vitals reviewed.  Constitutional:      Appearance: She is obese.  HENT:     Mouth/Throat:      Mouth: Mucous membranes are moist.  Eyes:     Extraocular Movements: Extraocular  movements intact.     Pupils: Pupils are equal, round, and reactive to light.  Cardiovascular:     Rate and Rhythm: Normal rate and regular rhythm.  Pulmonary:     Effort: Pulmonary effort is normal.     Breath sounds: Normal breath sounds.  Abdominal:     General: Bowel sounds are normal. There is no distension.     Palpations: Abdomen is soft.     Tenderness: There is no abdominal tenderness.  Musculoskeletal:     Cervical back: Normal range of motion and neck supple.       Legs:  Neurological:     General: No focal deficit present.     Mental Status: She is alert.  Psychiatric:        Mood and Affect: Mood normal.   Please see photo in media tab.   Lab Results Results for orders placed or performed during the hospital encounter of 07/11/20 (from the past 48 hour(s))  CBC with Differential     Status: Abnormal   Collection Time: 07/11/20  6:12 PM  Result Value Ref Range   WBC 9.6 4.0 - 10.5 K/uL   RBC 3.03 (L) 3.87 - 5.11 MIL/uL   Hemoglobin 10.4 (L) 12.0 - 15.0 g/dL   HCT 52.8 (L) 41.3 - 24.4 %   MCV 109.9 (H) 80.0 - 100.0 fL   MCH 34.3 (H) 26.0 - 34.0 pg   MCHC 31.2 30.0 - 36.0 g/dL   RDW 01.0 27.2 - 53.6 %   Platelets 466 (H) 150 - 400 K/uL   nRBC 0.0 0.0 - 0.2 %   Neutrophils Relative % 75 %   Neutro Abs 7.1 1.7 - 7.7 K/uL   Lymphocytes Relative 16 %   Lymphs Abs 1.5 0.7 - 4.0 K/uL   Monocytes Relative 9 %   Monocytes Absolute 0.9 0.1 - 1.0 K/uL   Eosinophils Relative 0 %   Eosinophils Absolute 0.0 0.0 - 0.5 K/uL   Basophils Relative 0 %   Basophils Absolute 0.0 0.0 - 0.1 K/uL   Immature Granulocytes 0 %   Abs Immature Granulocytes 0.04 0.00 - 0.07 K/uL    Comment: Performed at [facility], [address], [place], [place] [address]  Basic metabolic panel     Status: Abnormal   Collection Time: 07/11/20  6:12 PM  Result Value Ref Range   Sodium 134  (L) 135 - 145 mmol/L   Potassium 3.8 3.5 - 5.1 mmol/L   Chloride 96 (L) 98 - 111 mmol/L   CO2 28 22 - 32 mmol/L   Glucose, Bld 97 70 - 99 mg/dL    Comment: Glucose reference range applies only to samples taken after fasting for at least 8 hours.   BUN <5 (L) 6 - 20 mg/dL   Creatinine, Ser 4.74 0.44 - 1.00 mg/dL   Calcium 8.7 (L) 8.9 - 10.3 mg/dL   GFR, Estimated >25 >95 mL/min    Comment: (NOTE) Calculated using the CKD-EPI Creatinine Equation (2021)    Anion gap 10 5 - 15    Comment: Performed at [facility], [address] [place], [place] [address]  Resp Panel by RT-PCR (Flu A&B, Covid) Nasopharyngeal Swab     Status: None   Collection Time: 07/11/20  6:33 PM   Specimen: Nasopharyngeal Swab; Nasopharyngeal(NP) swabs in vial transport medium  Result Value Ref Range   SARS Coronavirus 2 by RT PCR NEGATIVE NEGATIVE    Comment: (NOTE) SARS-CoV-2 target  nucleic acids are NOT DETECTED.  The SARS-CoV-2 RNA is generally detectable in upper respiratory specimens during the acute phase of infection. The lowest concentration of SARS-CoV-2 viral copies this assay can detect is 138 copies/mL. A negative result does not preclude SARS-Cov-2 infection and should not be used as the sole basis for treatment or other patient management decisions. A negative result may occur with  improper specimen collection/handling, submission of specimen other than nasopharyngeal swab, presence of viral mutation(s) within the areas targeted by this assay, and inadequate number of viral copies(<138 copies/mL). A negative result must be combined with clinical observations, patient history, and epidemiological information. The expected result is Negative.  Fact Sheet for Patients:  BloggerCourse.com  Fact Sheet for Healthcare Providers:  SeriousBroker.it  This test is no t yet approved or cleared by the [place] FDA and  has been  authorized for detection and/or diagnosis of SARS-CoV-2 by FDA under an Emergency Use Authorization (EUA). This EUA will remain  in effect (meaning this test can be used) for the duration of the COVID-19 declaration under Section 564(b)(1) of the Act, 21 U.S.C.section 360bbb-3(b)(1), unless the authorization is terminated  or revoked sooner.       Influenza A by PCR NEGATIVE NEGATIVE   Influenza B by PCR NEGATIVE NEGATIVE    Comment: (NOTE) The Xpert Xpress SARS-CoV-2/FLU/RSV plus assay is intended as an aid in the diagnosis of influenza from Nasopharyngeal swab specimens and should not be used as a sole basis for treatment. Nasal washings and aspirates are unacceptable for Xpert Xpress SARS-CoV-2/FLU/RSV testing.  Fact Sheet for Patients: BloggerCourse.com  Fact Sheet for Healthcare Providers: SeriousBroker.it  This test is not yet approved or cleared by the [place] FDA and has been authorized for detection and/or diagnosis of SARS-CoV-2 by FDA under an Emergency Use Authorization (EUA). This EUA will remain in effect (meaning this test can be used) for the duration of the COVID-19 declaration under Section 564(b)(1) of the Act, 21 U.S.C. section 360bbb-3(b)(1), unless the authorization is terminated or revoked.  Performed at [facility], [address] [place], [place] [address]   CBC     Status: Abnormal   Collection Time: 07/11/20 10:19 PM  Result Value Ref Range   WBC 10.4 4.0 - 10.5 K/uL   RBC 2.93 (L) 3.87 - 5.11 MIL/uL   Hemoglobin 10.1 (L) 12.0 - 15.0 g/dL   HCT 10.2 (L) 72.5 - 36.6 %   MCV 110.6 (H) 80.0 - 100.0 fL   MCH 34.5 (H) 26.0 - 34.0 pg   MCHC 31.2 30.0 - 36.0 g/dL   RDW 44.0 34.7 - 42.5 %   Platelets 473 (H) 150 - 400 K/uL   nRBC 0.0 0.0 - 0.2 %    Comment: Performed at [facility], [address] [place], [place] [address]  Comprehensive metabolic panel      Status: Abnormal   Collection Time: 07/11/20 10:19 PM  Result Value Ref Range   Sodium 133 (L) 135 - 145 mmol/L   Potassium 3.8 3.5 - 5.1 mmol/L   Chloride 94 (L) 98 - 111 mmol/L   CO2 27 22 - 32 mmol/L   Glucose, Bld 117 (H) 70 - 99 mg/dL    Comment: Glucose reference range applies only to samples taken after fasting for at least 8 hours.   BUN 6 6 - 20 mg/dL   Creatinine, Ser 7.56 0.44 - 1.00 mg/dL   Calcium 8.6 (L) 8.9 - 10.3 mg/dL  Total Protein 7.4 6.5 - 8.1 g/dL   Albumin 3.1 (L) 3.5 - 5.0 g/dL   AST 26 15 - 41 U/L   ALT 23 0 - 44 U/L   Alkaline Phosphatase 113 38 - 126 U/L   Total Bilirubin 0.6 0.3 - 1.2 mg/dL   GFR, Estimated >40 >98 mL/min    Comment: (NOTE) Calculated using the CKD-EPI Creatinine Equation (2021)    Anion gap 12 5 - 15    Comment: Performed at [facility], [address], [place], [place] [address]  Hepatic function panel     Status: Abnormal   Collection Time: 07/11/20 10:19 PM  Result Value Ref Range   Total Protein 7.4 6.5 - 8.1 g/dL   Albumin 3.2 (L) 3.5 - 5.0 g/dL   AST 27 15 - 41 U/L   ALT 23 0 - 44 U/L   Alkaline Phosphatase 115 38 - 126 U/L   Total Bilirubin 0.8 0.3 - 1.2 mg/dL   Bilirubin, Direct <7.8 0.0 - 0.2 mg/dL   Indirect Bilirubin NOT CALCULATED 0.3 - 0.9 mg/dL    Comment: Performed at [facility], [address], [place], [place] [address]  Magnesium     Status: None   Collection Time: 07/11/20 10:19 PM  Result Value Ref Range   Magnesium 2.0 1.7 - 2.4 mg/dL    Comment: Performed at [facility], [address] [place], [place] [address]  Phosphorus     Status: None   Collection Time: 07/11/20 10:19 PM  Result Value Ref Range   Phosphorus 3.6 2.5 - 4.6 mg/dL    Comment: Performed at [facility], [address], [place], [place] [address]  Protime-INR     Status: None   Collection Time: 07/11/20 10:19 PM  Result Value Ref Range   Prothrombin  Time 13.9 11.4 - 15.2 seconds   INR 1.1 0.8 - 1.2    Comment: (NOTE) INR goal varies based on device and disease states. Performed at [facility], [address], [place], [place] [address]   TSH     Status: Abnormal   Collection Time: 07/11/20 10:19 PM  Result Value Ref Range   TSH 4.888 (H) 0.350 - 4.500 uIU/mL    Comment: Performed by a 3rd Generation assay with a functional sensitivity of <=0.01 uIU/mL. Performed at [facility], [address] [place], [place] [address]   Folate     Status: None   Collection Time: 07/11/20 10:19 PM  Result Value Ref Range   Folate 6.5 >5.9 ng/mL    Comment: Performed at [facility], [address], [place], [place] [address]  Vitamin B12     Status: None   Collection Time: 07/11/20 10:19 PM  Result Value Ref Range   Vitamin B-12 282 180 - 914 pg/mL    Comment: (NOTE) This assay is not validated for testing neonatal or myeloproliferative syndrome specimens for Vitamin B12 levels. Performed at [facility], [address] [place], [place] [address]   Comprehensive metabolic panel     Status: Abnormal   Collection Time: 07/12/20  3:11 AM  Result Value Ref Range   Sodium 134 (L) 135 - 145 mmol/L   Potassium 3.8 3.5 - 5.1 mmol/L   Chloride 98 98 - 111 mmol/L   CO2 28 22 - 32 mmol/L   Glucose, Bld 100 (H) 70 - 99 mg/dL    Comment: Glucose reference  range applies only to samples taken after fasting for at least 8 hours.   BUN 7 6 - 20 mg/dL   Creatinine, Ser 1.19 0.44 - 1.00 mg/dL   Calcium 8.5 (L) 8.9 - 10.3 mg/dL   Total Protein 6.5 6.5 - 8.1 g/dL   Albumin 2.9 (L) 3.5 - 5.0 g/dL   AST 21 15 - 41 U/L   ALT 21 0 - 44 U/L   Alkaline Phosphatase 103 38 - 126 U/L   Total Bilirubin 0.7 0.3 - 1.2 mg/dL   GFR, Estimated >14 >78 mL/min    Comment: (NOTE) Calculated using the CKD-EPI Creatinine Equation (2021)    Anion gap 8 5 - 15    Comment: Performed at  [facility], [address], [place], [place] [address]  CBC     Status: Abnormal   Collection Time: 07/12/20  3:11 AM  Result Value Ref Range   WBC 8.3 4.0 - 10.5 K/uL   RBC 2.70 (L) 3.87 - 5.11 MIL/uL   Hemoglobin 9.4 (L) 12.0 - 15.0 g/dL   HCT 13.0 (L) 86.5 - 78.4 %   MCV 110.7 (H) 80.0 - 100.0 fL   MCH 34.8 (H) 26.0 - 34.0 pg   MCHC 31.4 30.0 - 36.0 g/dL   RDW 69.6 29.5 - 28.4 %   Platelets 428 (H) 150 - 400 K/uL   nRBC 0.0 0.0 - 0.2 %    Comment: Performed at [facility], [address] [place], [place] [address]   No results found for: SDES, SPECREQUEST, CULT, REPTSTATUS No results found. Recent Results (from the past 240 hour(s))  Resp Panel by RT-PCR (Flu A&B, Covid) Nasopharyngeal Swab     Status: None   Collection Time: 07/11/20  6:33 PM   Specimen: Nasopharyngeal Swab; Nasopharyngeal(NP) swabs in vial transport medium  Result Value Ref Range Status   SARS Coronavirus 2 by RT PCR NEGATIVE NEGATIVE Final    Comment: (NOTE) SARS-CoV-2 target nucleic acids are NOT DETECTED.  The SARS-CoV-2 RNA is generally detectable in upper respiratory specimens during the acute phase of infection. The lowest concentration of SARS-CoV-2 viral copies this assay can detect is 138 copies/mL. A negative result does not preclude SARS-Cov-2 infection and should not be used as the sole basis for treatment or other patient management decisions. A negative result may occur with  improper specimen collection/handling, submission of specimen other than nasopharyngeal swab, presence of viral mutation(s) within the areas targeted by this assay, and inadequate number of viral copies(<138 copies/mL). A negative result must be combined with clinical observations, patient history, and epidemiological information. The expected result is Negative.  Fact Sheet for Patients:  BloggerCourse.com  Fact Sheet for Healthcare Providers:   SeriousBroker.it  This test is no t yet approved or cleared by the [place] FDA and  has been authorized for detection and/or diagnosis of SARS-CoV-2 by FDA under an Emergency Use Authorization (EUA). This EUA will remain  in effect (meaning this test can be used) for the duration of the COVID-19 declaration under Section 564(b)(1) of the Act, 21 U.S.C.section 360bbb-3(b)(1), unless the authorization is terminated  or revoked sooner.       Influenza A by PCR NEGATIVE NEGATIVE Final   Influenza B by PCR NEGATIVE NEGATIVE Final    Comment: (NOTE) The Xpert Xpress SARS-CoV-2/FLU/RSV plus assay is intended as an aid in the diagnosis of influenza from Nasopharyngeal swab specimens and should not be used as a sole basis for treatment.  Nasal washings and aspirates are unacceptable for Xpert Xpress SARS-CoV-2/FLU/RSV testing.  Fact Sheet for Patients: BloggerCourse.com  Fact Sheet for Healthcare Providers: SeriousBroker.it  This test is not yet approved or cleared by the [place] FDA and has been authorized for detection and/or diagnosis of SARS-CoV-2 by FDA under an Emergency Use Authorization (EUA). This EUA will remain in effect (meaning this test can be used) for the duration of the COVID-19 declaration under Section 564(b)(1) of the Act, 21 U.S.C. section 360bbb-3(b)(1), unless the authorization is terminated or revoked.  Performed at [facility], [address], [place], [place] [address]     Microbiology: Recent Results (from the past 240 hour(s))  Resp Panel by RT-PCR (Flu A&B, Covid) Nasopharyngeal Swab     Status: None   Collection Time: 07/11/20  6:33 PM   Specimen: Nasopharyngeal Swab; Nasopharyngeal(NP) swabs in vial transport medium  Result Value Ref Range Status   SARS Coronavirus 2 by RT PCR NEGATIVE NEGATIVE Final    Comment: (NOTE) SARS-CoV-2 target  nucleic acids are NOT DETECTED.  The SARS-CoV-2 RNA is generally detectable in upper respiratory specimens during the acute phase of infection. The lowest concentration of SARS-CoV-2 viral copies this assay can detect is 138 copies/mL. A negative result does not preclude SARS-Cov-2 infection and should not be used as the sole basis for treatment or other patient management decisions. A negative result may occur with  improper specimen collection/handling, submission of specimen other than nasopharyngeal swab, presence of viral mutation(s) within the areas targeted by this assay, and inadequate number of viral copies(<138 copies/mL). A negative result must be combined with clinical observations, patient history, and epidemiological information. The expected result is Negative.  Fact Sheet for Patients:  BloggerCourse.com  Fact Sheet for Healthcare Providers:  SeriousBroker.it  This test is no t yet approved or cleared by the [place] FDA and  has been authorized for detection and/or diagnosis of SARS-CoV-2 by FDA under an Emergency Use Authorization (EUA). This EUA will remain  in effect (meaning this test can be used) for the duration of the COVID-19 declaration under Section 564(b)(1) of the Act, 21 U.S.C.section 360bbb-3(b)(1), unless the authorization is terminated  or revoked sooner.       Influenza A by PCR NEGATIVE NEGATIVE Final   Influenza B by PCR NEGATIVE NEGATIVE Final    Comment: (NOTE) The Xpert Xpress SARS-CoV-2/FLU/RSV plus assay is intended as an aid in the diagnosis of influenza from Nasopharyngeal swab specimens and should not be used as a sole basis for treatment. Nasal washings and aspirates are unacceptable for Xpert Xpress SARS-CoV-2/FLU/RSV testing.  Fact Sheet for Patients: BloggerCourse.com  Fact Sheet for Healthcare  Providers: SeriousBroker.it  This test is not yet approved or cleared by the [place] FDA and has been authorized for detection and/or diagnosis of SARS-CoV-2 by FDA under an Emergency Use Authorization (EUA). This EUA will remain in effect (meaning this test can be used) for the duration of the COVID-19 declaration under Section 564(b)(1) of the Act, 21 U.S.C. section 360bbb-3(b)(1), unless the authorization is terminated or revoked.  Performed at [facility], [address] [place], [place] [address]     Radiographs and labs were personally reviewed by me.   Johny Sax, MD [facility] [phone] 07/12/2020, 12:41 PM

## 2020-07-13 DIAGNOSIS — E039 Hypothyroidism, unspecified: Secondary | ICD-10-CM | POA: Diagnosis not present

## 2020-07-13 DIAGNOSIS — E669 Obesity, unspecified: Secondary | ICD-10-CM | POA: Diagnosis not present

## 2020-07-13 DIAGNOSIS — M25562 Pain in left knee: Secondary | ICD-10-CM | POA: Diagnosis not present

## 2020-07-13 DIAGNOSIS — B20 Human immunodeficiency virus [HIV] disease: Secondary | ICD-10-CM | POA: Diagnosis not present

## 2020-07-13 DIAGNOSIS — M8700 Idiopathic aseptic necrosis of unspecified bone: Secondary | ICD-10-CM | POA: Diagnosis not present

## 2020-07-13 LAB — URINALYSIS, ROUTINE W REFLEX MICROSCOPIC
Bilirubin Urine: NEGATIVE
Glucose, UA: NEGATIVE mg/dL
Hgb urine dipstick: NEGATIVE
Ketones, ur: NEGATIVE mg/dL
Leukocytes,Ua: NEGATIVE
Nitrite: NEGATIVE
Protein, ur: NEGATIVE mg/dL
Specific Gravity, Urine: 1.006 (ref 1.005–1.030)
pH: 8 (ref 5.0–8.0)

## 2020-07-13 LAB — CBC
HCT: 29.7 % — ABNORMAL LOW (ref 36.0–46.0)
Hemoglobin: 9.1 g/dL — ABNORMAL LOW (ref 12.0–15.0)
MCH: 34.2 pg — ABNORMAL HIGH (ref 26.0–34.0)
MCHC: 30.6 g/dL (ref 30.0–36.0)
MCV: 111.7 fL — ABNORMAL HIGH (ref 80.0–100.0)
Platelets: 405 10*3/uL — ABNORMAL HIGH (ref 150–400)
RBC: 2.66 MIL/uL — ABNORMAL LOW (ref 3.87–5.11)
RDW: 14.2 % (ref 11.5–15.5)
WBC: 7.8 10*3/uL (ref 4.0–10.5)
nRBC: 0 % (ref 0.0–0.2)

## 2020-07-13 LAB — COMPREHENSIVE METABOLIC PANEL
ALT: 17 U/L (ref 0–44)
AST: 19 U/L (ref 15–41)
Albumin: 2.5 g/dL — ABNORMAL LOW (ref 3.5–5.0)
Alkaline Phosphatase: 88 U/L (ref 38–126)
Anion gap: 7 (ref 5–15)
BUN: 6 mg/dL (ref 6–20)
CO2: 25 mmol/L (ref 22–32)
Calcium: 8.6 mg/dL — ABNORMAL LOW (ref 8.9–10.3)
Chloride: 105 mmol/L (ref 98–111)
Creatinine, Ser: 0.46 mg/dL (ref 0.44–1.00)
GFR, Estimated: >60 mL/min (ref >60)
Glucose, Bld: 94 mg/dL (ref 70–99)
Potassium: 3.4 mmol/L — ABNORMAL LOW (ref 3.5–5.1)
Sodium: 137 mmol/L (ref 135–145)
Total Bilirubin: 0.6 mg/dL (ref 0.3–1.2)
Total Protein: 6 g/dL — ABNORMAL LOW (ref 6.5–8.1)

## 2020-07-13 MED ORDER — ENSURE MAX PROTEIN PO LIQD: 11.0000 [oz_av] | Freq: Every day | ORAL | Status: DC

## 2020-07-13 MED ORDER — ENSURE ENLIVE PO LIQD
237.0000 mL | ORAL | Status: DC
  Administered 2020-07-14 – 2020-07-17 (×3): 237 mL via ORAL

## 2020-07-13 MED ORDER — PROSOURCE PLUS PO LIQD
30.0000 mL | Freq: Two times a day (BID) | ORAL | Status: DC
  Administered 2020-07-13 – 2020-07-17 (×2): 30 mL via ORAL
  Filled 2020-07-13 (×6): qty 30

## 2020-07-13 NOTE — Progress Notes (Signed)
Initial Nutrition Assessment  DOCUMENTATION CODES:   Obesity unspecified  INTERVENTION:   -Ensure MAX Protein po daily, each supplement provides 150 kcal and 30 grams of protein  -Prosource Plus PO BID, each provides 100 kcals and 15g protein  NUTRITION DIAGNOSIS:   Increased nutrient needs related to wound healing as evidenced by estimated needs.  GOAL:   Patient will meet greater than or equal to 90% of their needs  MONITOR:   PO intake,Supplement acceptance,Labs,Weight trends,I & O's,Skin  REASON FOR ASSESSMENT:   Malnutrition Screening Tool    ASSESSMENT:   40 year old female with medical history of ADHD, anxiety, depression, asthma, fatty liver disease, elevated LFTs, GERD, history of heatstroke with syncopal episode, thrombocytopenia, HIV on antiretroviral therapy, hyperlipidemia, hypertension who was diagnosed with AVN on 02/2020 came to ED after she was referred by ID due to exacerbation of pain of left pretibial area with edema and erythema.  MRI ordered by ID Dr. Ninetta Lights showed complex fluid collection with marrow edema suspicious for acute on chronic AVN with infectious complex.  3/22: s/p biopsy of inflamed tissue adjacent to the anterior cortex of left proximal tibia.   Patient currently consuming 100% of breakfast this morning consisting of omelet and fruit. Pt had dinner brought in by husband last night. Will order protein supplements to aid in wound healing.  Per weight records, pt needs weight for this admission. Last recorded weight from 3/17, pt weighed 233 lbs. Pt had lost 34 lbs since 12/01/19 (12% wt loss x 7 months, significant for time frame).  Medications: Vitamin C, Colace, Miralax   Labs reviewed: Low K  NUTRITION - FOCUSED PHYSICAL EXAM:  No depletions noted.  Diet Order:   Diet Order            Diet regular Room service appropriate? Yes; Fluid consistency: Thin  Diet effective now                 EDUCATION NEEDS:   No education  needs have been identified at this time  Skin:  Skin Assessment: Skin Integrity Issues: Skin Integrity Issues:: Other (Comment) Other: left tibia  Last BM:  3/22  Height:   Ht Readings from Last 1 Encounters:  07/11/20 5\' 4"  (1.626 m)    Weight:   Wt Readings from Last 1 Encounters:  07/07/20 106.1 kg   BMI:  Body mass index is 40.17 kg/m.  Estimated Nutritional Needs:   Kcal:  1900-2100  Protein:  85-100g  Fluid:  2L/day  07/09/20, MS, RD, LDN Inpatient Clinical Dietitian Contact information available via Amion

## 2020-07-13 NOTE — Plan of Care (Signed)
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
## 2020-07-13 NOTE — Progress Notes (Signed)
INFECTIOUS DISEASE PROGRESS NOTE  ID: Lindsay French is a 40 y.o. female with  Principal Problem:   Avascular necrosis of bone (HCC) Active Problems:   ADHD (attention deficit hyperactivity disorder)   Class 3 obesity   Hypertension   Hyperlipidemia   Human immunodeficiency virus (HIV) disease (HCC)   AVN (avascular necrosis of bone) (HCC)   Hypothyroidism   GERD (gastroesophageal reflux disease)   Hepatic steatosis   Thrombocytosis   Macrocytic anemia  Subjective: Pain and swelling better (she attributes this to being off her leg)  Abtx:  Anti-infectives (From admission, onward)   Start     Dose/Rate Route Frequency Ordered Stop   07/12/20 1000  dolutegravir (TIVICAY) tablet 50 mg        50 mg Oral Daily 07/11/20 2142     07/12/20 1000  lamiVUDine (EPIVIR) tablet 300 mg        300 mg Oral Daily 07/11/20 2142     07/11/20 2115  Dolutegravir-lamiVUDine 50-300 MG TABS 1 tablet  Status:  Discontinued        1 tablet Oral Daily 07/11/20 2114 07/11/20 2141      Medications:  Scheduled: . amphetamine-dextroamphetamine  20 mg Oral q morning  . vitamin C  1,000 mg Oral Daily  . busPIRone  15 mg Oral BID  . docusate sodium  100 mg Oral Daily  . dolutegravir  50 mg Oral Daily  . enoxaparin (LOVENOX) injection  40 mg Subcutaneous Q24H  . famotidine  20 mg Oral BID  . gabapentin  300 mg Oral QHS  . irbesartan  150 mg Oral QPM  . lamiVUDine  300 mg Oral Daily  . melatonin  15 mg Oral QHS  . polyethylene glycol  17 g Oral Daily  . venlafaxine XR  225 mg Oral QPM    Objective: Vital signs in last 24 hours: Temp:  [98.3 F (36.8 C)-98.4 F (36.9 C)] 98.4 F (36.9 C) (03/23 0448) Pulse Rate:  [71-85] 75 (03/23 0448) Resp:  [12-20] 16 (03/23 0448) BP: (109-123)/(73-88) 119/78 (03/23 0448) SpO2:  [97 %-100 %] 99 % (03/23 0448)   General appearance: alert, cooperative, no distress and moderately obese Resp: clear to auscultation bilaterally Cardio: regular rate and  rhythm GI: normal findings: bowel sounds normal and soft, non-tender Extremities: decreased swelling, decreased tenderenss, decreased erythema. wound clean.   Lab Results Recent Labs    07/12/20 0311 07/13/20 0330  WBC 8.3 7.8  HGB 9.4* 9.1*  HCT 29.9* 29.7*  NA 134* 137  K 3.8 3.4*  CL 98 105  CO2 28 25  BUN 7 6  CREATININE 0.61 0.46   Liver Panel Recent Labs    07/11/20 2219 07/12/20 0311 07/13/20 0330  PROT 7.4  7.4 6.5 6.0*  ALBUMIN 3.2*  3.1* 2.9* 2.5*  AST 27  26 21 19   ALT 23  23 21 17   ALKPHOS 115  113 103 88  BILITOT 0.8  0.6 0.7 0.6  BILIDIR <0.1  --   --   IBILI NOT CALCULATED  --   --    Sedimentation Rate No results for input(s): ESRSEDRATE in the last 72 hours. C-Reactive Protein No results for input(s): CRP in the last 72 hours.  Microbiology: Recent Results (from the past 240 hour(s))  Resp Panel by RT-PCR (Flu A&B, Covid) Nasopharyngeal Swab     Status: None   Collection Time: 07/11/20  6:33 PM   Specimen: Nasopharyngeal Swab; Nasopharyngeal(NP) swabs in vial transport medium  Result Value Ref Range Status   SARS Coronavirus 2 by RT PCR NEGATIVE NEGATIVE Final    Comment: (NOTE) SARS-CoV-2 target nucleic acids are NOT DETECTED.  The SARS-CoV-2 RNA is generally detectable in upper respiratory specimens during the acute phase of infection. The lowest concentration of SARS-CoV-2 viral copies this assay can detect is 138 copies/mL. A negative result does not preclude SARS-Cov-2 infection and should not be used as the sole basis for treatment or other patient management decisions. A negative result may occur with  improper specimen collection/handling, submission of specimen other than nasopharyngeal swab, presence of viral mutation(s) within the areas targeted by this assay, and inadequate number of viral copies(<138 copies/mL). A negative result must be combined with clinical observations, patient history, and  epidemiological information. The expected result is Negative.  Fact Sheet for Patients:  BloggerCourse.com  Fact Sheet for Healthcare Providers:  SeriousBroker.it  This test is no t yet approved or cleared by the [place] FDA and  has been authorized for detection and/or diagnosis of SARS-CoV-2 by FDA under an Emergency Use Authorization (EUA). This EUA will remain  in effect (meaning this test can be used) for the duration of the COVID-19 declaration under Section 564(b)(1) of the Act, 21 U.S.C.section 360bbb-3(b)(1), unless the authorization is terminated  or revoked sooner.       Influenza A by PCR NEGATIVE NEGATIVE Final   Influenza B by PCR NEGATIVE NEGATIVE Final    Comment: (NOTE) The Xpert Xpress SARS-CoV-2/FLU/RSV plus assay is intended as an aid in the diagnosis of influenza from Nasopharyngeal swab specimens and should not be used as a sole basis for treatment. Nasal washings and aspirates are unacceptable for Xpert Xpress SARS-CoV-2/FLU/RSV testing.  Fact Sheet for Patients: BloggerCourse.com  Fact Sheet for Healthcare Providers: SeriousBroker.it  This test is not yet approved or cleared by the [place] FDA and has been authorized for detection and/or diagnosis of SARS-CoV-2 by FDA under an Emergency Use Authorization (EUA). This EUA will remain in effect (meaning this test can be used) for the duration of the COVID-19 declaration under Section 564(b)(1) of the Act, 21 U.S.C. section 360bbb-3(b)(1), unless the authorization is terminated or revoked.  Performed at [facility], [address] [place], [place] [address]   Aerobic/Anaerobic Culture (surgical/deep wound)     Status: None (Preliminary result)   Collection Time: 07/12/20  3:14 PM   Specimen: Tissue  Result Value Ref Range Status   Specimen Description   Final    TISSUE  LEFT LEG Performed at [facility], [address], [place], [place] [address]    Special Requests   Final    NONE Performed at [facility], [address], [place], [place] [address]    Gram Stain NO WBC SEEN NO ORGANISMS SEEN   Final   Culture   Final    NO GROWTH < 24 HOURS Performed at [facility], [address] [place], [place] [address]    Report Status PENDING  Incomplete    Studies/Results: [place] CORE BIOPSY (SOFT TISSUE)  Result Date: 07/12/2020 INDICATION: 40 year old woman with findings suspicious for left tibia osteomyelitis with overlying myositis presents over short Ya logistic for aspiration/biopsy. EXAM: Ultrasound-guided biopsy of inflamed tissues anterior to left tibia. MEDICATIONS: None. ANESTHESIA/SEDATION: Moderate (conscious) sedation was employed during this procedure. A total of Versed 2 mg and Fentanyl 100 mcg was administered intravenously. Moderate Sedation Time: 10 minutes. [name] level of consciousness and  vital signs were monitored continuously by radiology nursing throughout the procedure under my direct supervision. COMPLICATIONS: None immediate. PROCEDURE: Informed written consent was obtained from the patient after a thorough discussion of the procedural risks, benefits and alternatives. All questions were addressed. Maximal Sterile Barrier Technique was utilized including caps, mask, sterile gowns, sterile gloves, sterile drape, hand hygiene and skin antiseptic. A timeout was performed prior to the initiation of the procedure. Patient position supine on the ultrasound table. Right anterior lower leg skin prepped and draped in usual sterile fashion. Following local lidocaine administration, 17 gauge introducer needle was advanced into the abnormal tissues anterior to the proximal tibia. No fluid could be aspirated.Three 18 gauge cores were obtained utilizing continuous ultrasound guidance. Samples were sent to pathology in  sterile saline. Needle removed and hemostasis achieved with 2 minutes of manual compression. Post procedure ultrasound images showed no evidence of significant hemorrhage. IMPRESSION: Ultrasound-guided biopsy of inflamed soft tissues anterior to abnormal appearing tibia (suspicious for osteomyelitis). Three cores were sent in sterile saline for Gram stain and culture. Electronically Signed   By: Acquanetta Belling M.D.   On: 07/12/2020 16:53     Assessment/Plan: AVN L leg Cellulitis of LLE HIV Obesity High TSH Anemia  Total days of antibiotics: 0  Her Cx is no growth to date No Path sent. Will defer to Ortho if 2nd bx needed.  Appreciate Dr Lajoyce Corners and Dr Shon Baton eval Will check UA, for protein Check B12, folate outpt Endo eval (re-assured her that her TSH level is very mild for hypothyroidism) D/i pt (who wants to d/c)         Johny Sax MD, FACP Infectious Diseases (pager) [phone] [email or web address] 07/13/2020, 10:39 AM  LOS: 1 day

## 2020-07-14 ENCOUNTER — Other Ambulatory Visit: Payer: Self-pay | Admitting: Physician Assistant

## 2020-07-14 ENCOUNTER — Inpatient Hospital Stay: Payer: Self-pay

## 2020-07-14 DIAGNOSIS — E43 Unspecified severe protein-calorie malnutrition: Secondary | ICD-10-CM | POA: Diagnosis not present

## 2020-07-14 DIAGNOSIS — M86262 Subacute osteomyelitis, left tibia and fibula: Secondary | ICD-10-CM | POA: Diagnosis not present

## 2020-07-14 DIAGNOSIS — E669 Obesity, unspecified: Secondary | ICD-10-CM | POA: Diagnosis not present

## 2020-07-14 DIAGNOSIS — L03116 Cellulitis of left lower limb: Secondary | ICD-10-CM | POA: Diagnosis not present

## 2020-07-14 DIAGNOSIS — E039 Hypothyroidism, unspecified: Secondary | ICD-10-CM | POA: Diagnosis not present

## 2020-07-14 DIAGNOSIS — E441 Mild protein-calorie malnutrition: Secondary | ICD-10-CM

## 2020-07-14 DIAGNOSIS — K760 Fatty (change of) liver, not elsewhere classified: Secondary | ICD-10-CM

## 2020-07-14 DIAGNOSIS — M8700 Idiopathic aseptic necrosis of unspecified bone: Secondary | ICD-10-CM | POA: Diagnosis not present

## 2020-07-14 DIAGNOSIS — B20 Human immunodeficiency virus [HIV] disease: Secondary | ICD-10-CM | POA: Diagnosis not present

## 2020-07-14 DIAGNOSIS — M86462 Chronic osteomyelitis with draining sinus, left tibia and fibula: Secondary | ICD-10-CM

## 2020-07-14 DIAGNOSIS — M25562 Pain in left knee: Secondary | ICD-10-CM | POA: Diagnosis not present

## 2020-07-14 NOTE — Progress Notes (Addendum)
INFECTIOUS DISEASE PROGRESS NOTE  ID: Lindsay French is a 40 y.o. female with  Principal Problem:   Avascular necrosis of bone (HCC) Active Problems:   ADHD (attention deficit hyperactivity disorder)   Class 3 obesity   Hypertension   Hyperlipidemia   Human immunodeficiency virus (HIV) disease (HCC)   AVN (avascular necrosis of bone) (HCC)   Hypothyroidism   GERD (gastroesophageal reflux disease)   Hepatic steatosis   Thrombocytosis   Macrocytic anemia   Subacute osteomyelitis of left tibia (HCC)   Cellulitis of left leg   Severe protein-calorie malnutrition (HCC)  Subjective: Tearful this am C/o Worsening pain and swelling.   Abtx:  Anti-infectives (From admission, onward)   Start     Dose/Rate Route Frequency Ordered Stop   07/12/20 1000  dolutegravir (TIVICAY) tablet 50 mg        50 mg Oral Daily 07/11/20 2142     07/12/20 1000  lamiVUDine (EPIVIR) tablet 300 mg        300 mg Oral Daily 07/11/20 2142     07/11/20 2115  Dolutegravir-lamiVUDine 50-300 MG TABS 1 tablet  Status:  Discontinued        1 tablet Oral Daily 07/11/20 2114 07/11/20 2141      Medications:  Scheduled: . (feeding supplement) PROSource Plus  30 mL Oral BID WC  . amphetamine-dextroamphetamine  20 mg Oral q morning  . vitamin C  1,000 mg Oral Daily  . busPIRone  15 mg Oral BID  . docusate sodium  100 mg Oral Daily  . dolutegravir  50 mg Oral Daily  . enoxaparin (LOVENOX) injection  40 mg Subcutaneous Q24H  . famotidine  20 mg Oral BID  . feeding supplement  237 mL Oral Q24H  . gabapentin  300 mg Oral QHS  . irbesartan  150 mg Oral QPM  . lamiVUDine  300 mg Oral Daily  . melatonin  15 mg Oral QHS  . polyethylene glycol  17 g Oral Daily  . venlafaxine XR  225 mg Oral QPM    Objective: Vital signs in last 24 hours: Temp:  [98.1 F (36.7 C)-98.6 F (37 C)] 98.4 F (36.9 C) (03/24 0655) Pulse Rate:  [72-87] 87 (03/24 0655) Resp:  [16-18] 17 (03/24 0655) BP: (112-136)/(74-88) 112/74  (03/24 0655) SpO2:  [97 %-99 %] 99 % (03/24 0655)   General appearance: alert, cooperative, mild distress and morbidly obese Resp: clear to auscultation bilaterally Cardio: regular rate and rhythm GI: normal findings: bowel sounds normal and soft, non-tender Extremities: mild-mod increase in edema in LLE. mild tenderness.   Lab Results Recent Labs    07/12/20 0311 07/13/20 0330  WBC 8.3 7.8  HGB 9.4* 9.1*  HCT 29.9* 29.7*  NA 134* 137  K 3.8 3.4*  CL 98 105  CO2 28 25  BUN 7 6  CREATININE 0.61 0.46   Liver Panel Recent Labs    07/11/20 2219 07/12/20 0311 07/13/20 0330  PROT 7.4  7.4 6.5 6.0*  ALBUMIN 3.2*  3.1* 2.9* 2.5*  AST 27  26 21 19   ALT 23  23 21 17   ALKPHOS 115  113 103 88  BILITOT 0.8  0.6 0.7 0.6  BILIDIR <0.1  --   --   IBILI NOT CALCULATED  --   --    Sedimentation Rate No results for input(s): ESRSEDRATE in the last 72 hours. C-Reactive Protein No results for input(s): CRP in the last 72 hours.  Microbiology: Recent Results (from the past  240 hour(s))  Resp Panel by RT-PCR (Flu A&B, Covid) Nasopharyngeal Swab     Status: None   Collection Time: 07/11/20  6:33 PM   Specimen: Nasopharyngeal Swab; Nasopharyngeal(NP) swabs in vial transport medium  Result Value Ref Range Status   SARS Coronavirus 2 by RT PCR NEGATIVE NEGATIVE Final    Comment: (NOTE) SARS-CoV-2 target nucleic acids are NOT DETECTED.  The SARS-CoV-2 RNA is generally detectable in upper respiratory specimens during the acute phase of infection. The lowest concentration of SARS-CoV-2 viral copies this assay can detect is 138 copies/mL. A negative result does not preclude SARS-Cov-2 infection and should not be used as the sole basis for treatment or other patient management decisions. A negative result may occur with  improper specimen collection/handling, submission of specimen other than nasopharyngeal swab, presence of viral mutation(s) within the areas targeted by this  assay, and inadequate number of viral copies(<138 copies/mL). A negative result must be combined with clinical observations, patient history, and epidemiological information. The expected result is Negative.  Fact Sheet for Patients:  BloggerCourse.com  Fact Sheet for Healthcare Providers:  SeriousBroker.it  This test is no t yet approved or cleared by the [place] FDA and  has been authorized for detection and/or diagnosis of SARS-CoV-2 by FDA under an Emergency Use Authorization (EUA). This EUA will remain  in effect (meaning this test can be used) for the duration of the COVID-19 declaration under Section 564(b)(1) of the Act, 21 U.S.C.section 360bbb-3(b)(1), unless the authorization is terminated  or revoked sooner.       Influenza A by PCR NEGATIVE NEGATIVE Final   Influenza B by PCR NEGATIVE NEGATIVE Final    Comment: (NOTE) The Xpert Xpress SARS-CoV-2/FLU/RSV plus assay is intended as an aid in the diagnosis of influenza from Nasopharyngeal swab specimens and should not be used as a sole basis for treatment. Nasal washings and aspirates are unacceptable for Xpert Xpress SARS-CoV-2/FLU/RSV testing.  Fact Sheet for Patients: BloggerCourse.com  Fact Sheet for Healthcare Providers: SeriousBroker.it  This test is not yet approved or cleared by the [place] FDA and has been authorized for detection and/or diagnosis of SARS-CoV-2 by FDA under an Emergency Use Authorization (EUA). This EUA will remain in effect (meaning this test can be used) for the duration of the COVID-19 declaration under Section 564(b)(1) of the Act, 21 U.S.C. section 360bbb-3(b)(1), unless the authorization is terminated or revoked.  Performed at [facility], [address] [place], [place] [address]   Aerobic/Anaerobic Culture (surgical/deep wound)     Status: None  (Preliminary result)   Collection Time: 07/12/20  3:14 PM   Specimen: Tissue  Result Value Ref Range Status   Specimen Description   Final    TISSUE LEFT LEG Performed at [facility], [address] [place], [place] [address]    Special Requests   Final    NONE Performed at [facility], [address] [place], [place] [address]    Gram Stain NO WBC SEEN NO ORGANISMS SEEN   Final   Culture   Final    NO GROWTH < 24 HOURS Performed at [facility], [address] [place], [place] [address]    Report Status PENDING  Incomplete    Studies/Results: [place] CORE BIOPSY (SOFT TISSUE)  Result Date: 07/12/2020 INDICATION: 40 year old woman with findings suspicious for left tibia osteomyelitis with overlying myositis presents over short Ya logistic for aspiration/biopsy. EXAM: Ultrasound-guided biopsy of inflamed tissues anterior  to left tibia. MEDICATIONS: None. ANESTHESIA/SEDATION: Moderate (conscious) sedation was employed during this procedure. A total of Versed 2 mg and Fentanyl 100 mcg was administered intravenously. Moderate Sedation Time: 10 minutes. [name] level of consciousness and vital signs were monitored continuously by radiology nursing throughout the procedure under my direct supervision. COMPLICATIONS: None immediate. PROCEDURE: Informed written consent was obtained from the patient after a thorough discussion of the procedural risks, benefits and alternatives. All questions were addressed. Maximal Sterile Barrier Technique was utilized including caps, mask, sterile gowns, sterile gloves, sterile drape, hand hygiene and skin antiseptic. A timeout was performed prior to the initiation of the procedure. Patient position supine on the ultrasound table. Right anterior lower leg skin prepped and draped in usual sterile fashion. Following local lidocaine administration, 17 gauge introducer needle was advanced into the abnormal tissues anterior to the  proximal tibia. No fluid could be aspirated.Three 18 gauge cores were obtained utilizing continuous ultrasound guidance. Samples were sent to pathology in sterile saline. Needle removed and hemostasis achieved with 2 minutes of manual compression. Post procedure ultrasound images showed no evidence of significant hemorrhage. IMPRESSION: Ultrasound-guided biopsy of inflamed soft tissues anterior to abnormal appearing tibia (suspicious for osteomyelitis). Three cores were sent in sterile saline for Gram stain and culture. Electronically Signed   By: Acquanetta Belling M.D.   On: 07/12/2020 16:53     Assessment/Plan: AVN L leg Cellulitis of LLE HIV Obesity High TSH Anemia  Total days of antibiotics: 0 (off keflex since 3-22)  Will be transferred to [facility] today My great appreciation to Dr Lajoyce Corners (will ask him to please do biopsy as well as deep cx) Will hold anbx til post op Will place pic, discussed with pt Will let ID team at [facility] know of her arrival.  B12, Folate normal.    Johny Sax MD, FACP Infectious Diseases (pager) [phone] [email or web address] 07/14/2020, 8:33 AM  LOS: 2 days

## 2020-07-14 NOTE — Progress Notes (Signed)
Called and spoke with Sam, Charity fundraiser at [facility]. Gave patient report for transfer.
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
## 2020-07-14 NOTE — Progress Notes (Signed)
Consent obtained for PICC.  Patient wishes to wait until after procedure 07/15/20 to have PICC placed.

## 2020-07-14 NOTE — Consult Note (Signed)
ORTHOPAEDIC CONSULTATION  REQUESTING PHYSICIAN: Jerald Kief, MD  Chief Complaint: Pain and swelling and redness left leg.  HPI: Laurelyn Terrero is a 40 y.o. female who presents with pain redness and swelling in her left leg since November she denies any trauma denies any precipitating episodes.  She states the pain is quite intense she states she has had to be on morphine and oral narcotics to control the pain at home and she has been on Dilaudid and oral narcotics to control the pain in the hospital.  Patient denies a history of diabetes.  Patient states she has anxiety disorders hypertension but no other medical issues.  Past medical history notes a diagnosis of HIV.  Patient states she is not a smoker past medical history notes 15-year history of tobacco use.  Past Medical History:  Diagnosis Date  . ADHD   . Anxiety   . Asthma    usually with colds  . Depression   . Elevated LFTs   . Fatty liver 04/16/2016   Noted [place] ABD  . GERD (gastroesophageal reflux disease)   . Heart murmur    at birth, no issues  . History of heat stroke    with syncope  . History of thrombocytopenia    prior to diagnosis HIV  . HIV (human immunodeficiency virus infection) (HCC)   . Hyperlipidemia   . Hypertension   . Hypothyroidism   . Obese   . PCOS (polycystic ovarian syndrome)   . Pneumonia 04/17/2016  . Wears partial dentures    upper  . Wrist fracture 11/2017   Left   Past Surgical History:  Procedure Laterality Date  . HYSTEROSCOPY N/A 12/31/2017   Procedure: HYSTEROSCOPY and polypectomy;  Surgeon: Fermin Schwab, MD;  Location: [facility];  Service: Gynecology;  Laterality: N/A;  . maxillofacial surgery     Social History   Socioeconomic History  . Marital status: Married    Spouse name: Not on file  . Number of children: Not on file  . Years of education: Not on file  . Highest education level: Not on file  Occupational History  . Not on file  Tobacco  Use  . Smoking status: Former Smoker    Packs/day: 0.30    Years: 15.00    Pack years: 4.50    Types: Cigarettes  . Smokeless tobacco: Never Used  Vaping Use  . Vaping Use: Never used  Substance and Sexual Activity  . Alcohol use: Yes    Alcohol/week: 7.0 standard drinks    Types: 7 Glasses of wine per week    Comment: wine  2-3 times a week  . Drug use: Not Currently    Types: "Crack" cocaine    Comment: Previous hx of crack use - clean for 7 years   . Sexual activity: Not Currently    Partners: Male    Birth control/protection: None    Comment: try to conceive   Other Topics Concern  . Not on file  Social History Narrative   ** Merged History Encounter **       Social Determinants of Health   Financial Resource Strain: Not on file  Food Insecurity: Not on file  Transportation Needs: Not on file  Physical Activity: Not on file  Stress: Not on file  Social Connections: Not on file   Family History  Problem Relation Age of Onset  . Hypothyroidism Mother   . Hypertension Father    - negative except otherwise  stated in the family history section Allergies  Allergen Reactions  . Guaifenesin Nausea And Vomiting  . Hydrocodone Itching    hot  . Lactose Intolerance (Gi) Other (See Comments)    Upset stomach  . Latex Other (See Comments)    As a teenager when using latex condoms she would get a yeast infection.    Prior to Admission medications   Medication Sig Start Date End Date Taking? Authorizing Provider  albuterol (VENTOLIN HFA) 108 (90 Base) MCG/ACT inhaler Inhale 1 puff into the lungs every 6 (six) hours as needed for wheezing or shortness of breath. 02/04/20  Yes [provider]  ALPRAZolam (XANAX) 0.5 MG tablet Take 0.5 mg by mouth 2 (two) times daily as needed for anxiety.  12/21/15  Yes [provider]  amphetamine-dextroamphetamine (ADDERALL XR) 20 MG 24 hr capsule Take 20 mg by mouth every morning. 04/11/20  Yes [provider]   Ascorbic Acid (VITAMIN C) 1000 MG tablet Take 1,000 mg by mouth daily.   Yes [provider]  busPIRone (BUSPAR) 15 MG tablet Take 15 mg by mouth 2 (two) times daily. 06/20/20  Yes [provider]  celecoxib (CELEBREX) 200 MG capsule Take 1 capsule (200 mg total) by mouth 2 (two) times daily. 12/01/19  Yes Harris, Abigail, PA-C  cephALEXin (KEFLEX) 500 MG capsule Take 1 capsule (500 mg total) by mouth 4 (four) times daily. Patient taking differently: Take 500 mg by mouth 4 (four) times daily. Start date :07/05/20 07/05/20  Yes Hatcher, Jeffrey C, MD  Cyanocobalamin (VITAMIN B 12 PO) Take 1,000 mcg by mouth daily.   Yes [provider]  docusate sodium (COLACE) 100 MG capsule Take 100 mg by mouth daily.   Yes [provider]  [name] 50-300 MG TABS TAKE 1 TABLET BY MOUTH DAILY 07/01/20 09/29/20 Yes Hatcher, Jeffrey C, MD  gabapentin (NEURONTIN) 100 MG capsule Take 300 mg by mouth at bedtime.   Yes [provider]  irbesartan (AVAPRO) 150 MG tablet Take 150 mg by mouth every evening. 02/06/19  Yes [provider]  melatonin 5 MG TABS Take 15 mg by mouth at bedtime.   Yes [provider]  morphine (MS CONTIN) 15 MG 12 hr tablet Take 15-30 mg by mouth See admin instructions. Takes 1 tablet in the morning, 1 tablet at noon and 2 tablets at night 06/24/20  Yes [provider]  OVER THE COUNTER MEDICATION Take 400 mcg by mouth daily. Methyl Folate 400 mcg   Yes [provider]  OVER THE COUNTER MEDICATION Take 50 mg by mouth daily. P-5-P   Yes [provider]  oxyCODONE (OXY IR/ROXICODONE) 5 MG immediate release tablet Take 5 mg by mouth every 6 (six) hours as needed for severe pain.   Yes [provider]  polyethylene glycol (MIRALAX / GLYCOLAX) 17 g packet Take 17 g by mouth daily.   Yes [provider]  venlafaxine XR (EFFEXOR-XR) 75 MG 24 hr capsule Take 225 mg by mouth every evening. 12/21/15  Yes  [provider]  MELATONIN PO Take 1 tablet by mouth at bedtime as needed (sleep).    [provider]   US CORE BIOPSY (SOFT TISSUE)  Result Date: 07/12/2020 INDICATION: 39-year-old woman with findings suspicious for left tibia osteomyelitis with overlying myositis presents over short Ya logistic for aspiration/biopsy. EXAM: Ultrasound-guided biopsy of inflamed tissues anterior to left tibia. MEDICATIONS: None. ANESTHESIA/SEDATION: Moderate (conscious) sedation was employed during this procedure. A total of Versed   2 mg and Fentanyl 100 mcg was administered intravenously. Moderate Sedation Time: 10 minutes. [name] level of consciousness and vital signs were monitored continuously by radiology nursing throughout the procedure under my direct supervision. COMPLICATIONS: None immediate. PROCEDURE: Informed written consent was obtained from the patient after a thorough discussion of the procedural risks, benefits and alternatives. All questions were addressed. Maximal Sterile Barrier Technique was utilized including caps, mask, sterile gowns, sterile gloves, sterile drape, hand hygiene and skin antiseptic. A timeout was performed prior to the initiation of the procedure. Patient position supine on the ultrasound table. Right anterior lower leg skin prepped and draped in usual sterile fashion. Following local lidocaine administration, 17 gauge introducer needle was advanced into the abnormal tissues anterior to the proximal tibia. No fluid could be aspirated.Three 18 gauge cores were obtained utilizing continuous ultrasound guidance. Samples were sent to pathology in sterile saline. Needle removed and hemostasis achieved with 2 minutes of manual compression. Post procedure ultrasound images showed no evidence of significant hemorrhage. IMPRESSION: Ultrasound-guided biopsy of inflamed soft tissues anterior to abnormal appearing tibia (suspicious for osteomyelitis). Three cores were sent in  sterile saline for Gram stain and culture. Electronically Signed   By: Acquanetta Belling M.D.   On: 07/12/2020 16:53   - pertinent xrays, CT, MRI studies were reviewed and independently interpreted  Positive ROS: All other systems have been reviewed and were otherwise negative with the exception of those mentioned in the HPI and as above.  Physical Exam: General: Alert, no acute distress Psychiatric: Patient is competent for consent with normal mood and affect Lymphatic: No axillary or cervical lymphadenopathy Cardiovascular: No pedal edema Respiratory: No cyanosis, no use of accessory musculature GI: No organomegaly, abdomen is soft and non-tender    Images:  @ENCIMAGES @  Labs:  Lab Results  Component Value Date   REPTSTATUS PENDING 07/12/2020   GRAMSTAIN NO WBC SEEN NO ORGANISMS SEEN  07/12/2020   CULT  07/12/2020    NO GROWTH < 24 HOURS Performed at [facility], [address] [place], [place]     Lab Results  Component Value Date   ALBUMIN 2.5 (L) 07/13/2020   ALBUMIN 2.9 (L) 07/12/2020   ALBUMIN 3.1 (L) 07/11/2020   ALBUMIN 3.2 (L) 07/11/2020    Neurologic: Patient does not have protective sensation bilateral lower extremities.   MUSCULOSKELETAL:   Skin: Examination patient has cellulitis of the proximal third of the left leg.  The leg is swollen compared to the right leg.  Patient has tenderness with movement or palpation of the leg.  Patient has a strong dorsalis pedis pulse no arterial insufficiency.  Review of the MRI scan shows avascular necrosis of the proximal tibia bilaterally.  Patient has an albumin of 2.5 with protein caloric malnutrition.  Hemoglobin 9.1 with a white cell count of 7.8.  Assessment: Assessment: Avascular necrosis proximal left tibia with chronic pain and cellulitis with most likely underlying osteomyelitis.  Plan: I will plan for excisional debridement of the proximal tibia tomorrow, placement of intramedullary  antibiotic beads, bone and tissue to be sent for cultures.  Please have the patient transferred to [facility] today, so surgery can be performed tomorrow.  Plan for n.p.o. after midnight tonight.  Thank you for the consult and the opportunity to see Ms. [facility], MD [facility] [phone] 6:51 AM

## 2020-07-14 NOTE — H&P (View-Only) (Signed)
ORTHOPAEDIC CONSULTATION  REQUESTING PHYSICIAN: Jerald Kief, MD  Chief Complaint: Pain and swelling and redness left leg.  HPI: Lindsay French is a 40 y.o. female who presents with pain redness and swelling in her left leg since November she denies any trauma denies any precipitating episodes.  She states the pain is quite intense she states she has had to be on morphine and oral narcotics to control the pain at home and she has been on Dilaudid and oral narcotics to control the pain in the hospital.  Patient denies a history of diabetes.  Patient states she has anxiety disorders hypertension but no other medical issues.  Past medical history notes a diagnosis of HIV.  Patient states she is not a smoker past medical history notes 15-year history of tobacco use.  Past Medical History:  Diagnosis Date  . ADHD   . Anxiety   . Asthma    usually with colds  . Depression   . Elevated LFTs   . Fatty liver 04/16/2016   Noted [place] ABD  . GERD (gastroesophageal reflux disease)   . Heart murmur    at birth, no issues  . History of heat stroke    with syncope  . History of thrombocytopenia    prior to diagnosis HIV  . HIV (human immunodeficiency virus infection) (HCC)   . Hyperlipidemia   . Hypertension   . Hypothyroidism   . Obese   . PCOS (polycystic ovarian syndrome)   . Pneumonia 04/17/2016  . Wears partial dentures    upper  . Wrist fracture 11/2017   Left   Past Surgical History:  Procedure Laterality Date  . HYSTEROSCOPY N/A 12/31/2017   Procedure: HYSTEROSCOPY and polypectomy;  Surgeon: Fermin Schwab, MD;  Location: [facility];  Service: Gynecology;  Laterality: N/A;  . maxillofacial surgery     Social History   Socioeconomic History  . Marital status: Married    Spouse name: Not on file  . Number of children: Not on file  . Years of education: Not on file  . Highest education level: Not on file  Occupational History  . Not on file  Tobacco  Use  . Smoking status: Former Smoker    Packs/day: 0.30    Years: 15.00    Pack years: 4.50    Types: Cigarettes  . Smokeless tobacco: Never Used  Vaping Use  . Vaping Use: Never used  Substance and Sexual Activity  . Alcohol use: Yes    Alcohol/week: 7.0 standard drinks    Types: 7 Glasses of wine per week    Comment: wine  2-3 times a week  . Drug use: Not Currently    Types: "Crack" cocaine    Comment: Previous hx of crack use - clean for 7 years   . Sexual activity: Not Currently    Partners: Male    Birth control/protection: None    Comment: try to conceive   Other Topics Concern  . Not on file  Social History Narrative   ** Merged History Encounter **       Social Determinants of Health   Financial Resource Strain: Not on file  Food Insecurity: Not on file  Transportation Needs: Not on file  Physical Activity: Not on file  Stress: Not on file  Social Connections: Not on file   Family History  Problem Relation Age of Onset  . Hypothyroidism Mother   . Hypertension Father    - negative except otherwise  stated in the family history section Allergies  Allergen Reactions  . Guaifenesin Nausea And Vomiting  . Hydrocodone Itching    hot  . Lactose Intolerance (Gi) Other (See Comments)    Upset stomach  . Latex Other (See Comments)    As a teenager when using latex condoms she would get a yeast infection.    Prior to Admission medications   Medication Sig Start Date End Date Taking? Authorizing Provider  albuterol (VENTOLIN HFA) 108 (90 Base) MCG/ACT inhaler Inhale 1 puff into the lungs every 6 (six) hours as needed for wheezing or shortness of breath. 02/04/20  Yes [provider]  ALPRAZolam Prudy Feeler) 0.5 MG tablet Take 0.5 mg by mouth 2 (two) times daily as needed for anxiety.  12/21/15  Yes [provider]  amphetamine-dextroamphetamine (ADDERALL XR) 20 MG 24 hr capsule Take 20 mg by mouth every morning. 04/11/20  Yes [provider]   Ascorbic Acid (VITAMIN C) 1000 MG tablet Take 1,000 mg by mouth daily.   Yes [provider]  busPIRone (BUSPAR) 15 MG tablet Take 15 mg by mouth 2 (two) times daily. 06/20/20  Yes [provider]  celecoxib (CELEBREX) 200 MG capsule Take 1 capsule (200 mg total) by mouth 2 (two) times daily. 12/01/19  Yes Harris, Abigail, PA-C  cephALEXin (KEFLEX) 500 MG capsule Take 1 capsule (500 mg total) by mouth 4 (four) times daily. Patient taking differently: Take 500 mg by mouth 4 (four) times daily. Start date :07/05/20 07/05/20  Yes Ginnie Smart, MD  Cyanocobalamin (VITAMIN B 12 PO) Take 1,000 mcg by mouth daily.   Yes [provider]  docusate sodium (COLACE) 100 MG capsule Take 100 mg by mouth daily.   Yes [provider]  [name] 50-300 MG TABS TAKE 1 TABLET BY MOUTH DAILY 07/01/20 09/29/20 Yes Ginnie Smart, MD  gabapentin (NEURONTIN) 100 MG capsule Take 300 mg by mouth at bedtime.   Yes [provider]  irbesartan (AVAPRO) 150 MG tablet Take 150 mg by mouth every evening. 02/06/19  Yes [provider]  melatonin 5 MG TABS Take 15 mg by mouth at bedtime.   Yes [provider]  morphine (MS CONTIN) 15 MG 12 hr tablet Take 15-30 mg by mouth See admin instructions. Takes 1 tablet in the morning, 1 tablet at noon and 2 tablets at night 06/24/20  Yes [provider]  OVER THE COUNTER MEDICATION Take 400 mcg by mouth daily. Methyl Folate 400 mcg   Yes [provider]  OVER THE COUNTER MEDICATION Take 50 mg by mouth daily. P-5-P   Yes [provider]  oxyCODONE (OXY IR/ROXICODONE) 5 MG immediate release tablet Take 5 mg by mouth every 6 (six) hours as needed for severe pain.   Yes [provider]  polyethylene glycol (MIRALAX / GLYCOLAX) 17 g packet Take 17 g by mouth daily.   Yes [provider]  venlafaxine XR (EFFEXOR-XR) 75 MG 24 hr capsule Take 225 mg by mouth every evening. 12/21/15  Yes  [provider]  MELATONIN PO Take 1 tablet by mouth at bedtime as needed (sleep).    [provider]   [place] CORE BIOPSY (SOFT TISSUE)  Result Date: 07/12/2020 INDICATION: 40 year old woman with findings suspicious for left tibia osteomyelitis with overlying myositis presents over short Ya logistic for aspiration/biopsy. EXAM: Ultrasound-guided biopsy of inflamed tissues anterior to left tibia. MEDICATIONS: None. ANESTHESIA/SEDATION: Moderate (conscious) sedation was employed during this procedure. A total of Versed  2 mg and Fentanyl 100 mcg was administered intravenously. Moderate Sedation Time: 10 minutes. [name] level of consciousness and vital signs were monitored continuously by radiology nursing throughout the procedure under my direct supervision. COMPLICATIONS: None immediate. PROCEDURE: Informed written consent was obtained from the patient after a thorough discussion of the procedural risks, benefits and alternatives. All questions were addressed. Maximal Sterile Barrier Technique was utilized including caps, mask, sterile gowns, sterile gloves, sterile drape, hand hygiene and skin antiseptic. A timeout was performed prior to the initiation of the procedure. Patient position supine on the ultrasound table. Right anterior lower leg skin prepped and draped in usual sterile fashion. Following local lidocaine administration, 17 gauge introducer needle was advanced into the abnormal tissues anterior to the proximal tibia. No fluid could be aspirated.Three 18 gauge cores were obtained utilizing continuous ultrasound guidance. Samples were sent to pathology in sterile saline. Needle removed and hemostasis achieved with 2 minutes of manual compression. Post procedure ultrasound images showed no evidence of significant hemorrhage. IMPRESSION: Ultrasound-guided biopsy of inflamed soft tissues anterior to abnormal appearing tibia (suspicious for osteomyelitis). Three cores were sent in  sterile saline for Gram stain and culture. Electronically Signed   By: Acquanetta Belling M.D.   On: 07/12/2020 16:53   - pertinent xrays, CT, MRI studies were reviewed and independently interpreted  Positive ROS: All other systems have been reviewed and were otherwise negative with the exception of those mentioned in the HPI and as above.  Physical Exam: General: Alert, no acute distress Psychiatric: Patient is competent for consent with normal mood and affect Lymphatic: No axillary or cervical lymphadenopathy Cardiovascular: No pedal edema Respiratory: No cyanosis, no use of accessory musculature GI: No organomegaly, abdomen is soft and non-tender    Images:  @ENCIMAGES @  Labs:  Lab Results  Component Value Date   REPTSTATUS PENDING 07/12/2020   GRAMSTAIN NO WBC SEEN NO ORGANISMS SEEN  07/12/2020   CULT  07/12/2020    NO GROWTH < 24 HOURS Performed at [facility], [address] [place], [place]     Lab Results  Component Value Date   ALBUMIN 2.5 (L) 07/13/2020   ALBUMIN 2.9 (L) 07/12/2020   ALBUMIN 3.1 (L) 07/11/2020   ALBUMIN 3.2 (L) 07/11/2020    Neurologic: Patient does not have protective sensation bilateral lower extremities.   MUSCULOSKELETAL:   Skin: Examination patient has cellulitis of the proximal third of the left leg.  The leg is swollen compared to the right leg.  Patient has tenderness with movement or palpation of the leg.  Patient has a strong dorsalis pedis pulse no arterial insufficiency.  Review of the MRI scan shows avascular necrosis of the proximal tibia bilaterally.  Patient has an albumin of 2.5 with protein caloric malnutrition.  Hemoglobin 9.1 with a white cell count of 7.8.  Assessment: Assessment: Avascular necrosis proximal left tibia with chronic pain and cellulitis with most likely underlying osteomyelitis.  Plan: I will plan for excisional debridement of the proximal tibia tomorrow, placement of intramedullary  antibiotic beads, bone and tissue to be sent for cultures.  Please have the patient transferred to [facility] today, so surgery can be performed tomorrow.  Plan for n.p.o. after midnight tonight.  Thank you for the consult and the opportunity to see Ms. [facility], MD [facility] [phone] 6:51 AM

## 2020-07-15 ENCOUNTER — Inpatient Hospital Stay (HOSPITAL_COMMUNITY): Payer: 59

## 2020-07-15 ENCOUNTER — Encounter (HOSPITAL_COMMUNITY): Payer: Self-pay | Admitting: Family Medicine

## 2020-07-15 ENCOUNTER — Ambulatory Visit: Payer: 59 | Admitting: Infectious Diseases

## 2020-07-15 ENCOUNTER — Encounter (HOSPITAL_COMMUNITY)
Admission: EM | Disposition: A | Discharge status: Home / Self Care | Payer: Self-pay | Source: Ambulatory Visit | Attending: Internal Medicine

## 2020-07-15 DIAGNOSIS — M86262 Subacute osteomyelitis, left tibia and fibula: Secondary | ICD-10-CM

## 2020-07-15 DIAGNOSIS — F909 Attention-deficit hyperactivity disorder, unspecified type: Secondary | ICD-10-CM

## 2020-07-15 DIAGNOSIS — M8700 Idiopathic aseptic necrosis of unspecified bone: Secondary | ICD-10-CM | POA: Diagnosis not present

## 2020-07-15 DIAGNOSIS — L03116 Cellulitis of left lower limb: Secondary | ICD-10-CM

## 2020-07-15 DIAGNOSIS — E43 Unspecified severe protein-calorie malnutrition: Secondary | ICD-10-CM

## 2020-07-15 DIAGNOSIS — M25562 Pain in left knee: Secondary | ICD-10-CM

## 2020-07-15 DIAGNOSIS — I10 Essential (primary) hypertension: Secondary | ICD-10-CM

## 2020-07-15 DIAGNOSIS — M86462 Chronic osteomyelitis with draining sinus, left tibia and fibula: Principal | ICD-10-CM

## 2020-07-15 HISTORY — PX: I & D EXTREMITY: SHX5045

## 2020-07-15 LAB — CBC WITH DIFFERENTIAL/PLATELET
Abs Immature Granulocytes: 0.02 10*3/uL (ref 0.00–0.07)
Basophils Absolute: 0 10*3/uL (ref 0.0–0.1)
Basophils Relative: 0 %
Eosinophils Absolute: 0 10*3/uL (ref 0.0–0.5)
Eosinophils Relative: 0 %
HCT: 28.3 % — ABNORMAL LOW (ref 36.0–46.0)
Hemoglobin: 9.3 g/dL — ABNORMAL LOW (ref 12.0–15.0)
Immature Granulocytes: 0 %
Lymphocytes Relative: 20 %
Lymphs Abs: 1.3 10*3/uL (ref 0.7–4.0)
MCH: 35.1 pg — ABNORMAL HIGH (ref 26.0–34.0)
MCHC: 32.9 g/dL (ref 30.0–36.0)
MCV: 106.8 fL — ABNORMAL HIGH (ref 80.0–100.0)
Monocytes Absolute: 0.6 10*3/uL (ref 0.1–1.0)
Monocytes Relative: 9 %
Neutro Abs: 4.7 10*3/uL (ref 1.7–7.7)
Neutrophils Relative %: 71 %
Platelets: 389 10*3/uL (ref 150–400)
RBC: 2.65 MIL/uL — ABNORMAL LOW (ref 3.87–5.11)
RDW: 14.5 % (ref 11.5–15.5)
WBC: 6.7 10*3/uL (ref 4.0–10.5)
nRBC: 0 % (ref 0.0–0.2)

## 2020-07-15 LAB — PREGNANCY, URINE: Preg Test, Ur: NEGATIVE

## 2020-07-15 LAB — SURGICAL PCR SCREEN
MRSA, PCR: NEGATIVE
Staphylococcus aureus: NEGATIVE

## 2020-07-15 LAB — BASIC METABOLIC PANEL
Anion gap: 7 (ref 5–15)
BUN: <5 mg/dL — ABNORMAL LOW (ref 6–20)
CO2: 25 mmol/L (ref 22–32)
Calcium: 8.6 mg/dL — ABNORMAL LOW (ref 8.9–10.3)
Chloride: 105 mmol/L (ref 98–111)
Creatinine, Ser: 0.49 mg/dL (ref 0.44–1.00)
GFR, Estimated: >60 mL/min (ref >60)
Glucose, Bld: 94 mg/dL (ref 70–99)
Potassium: 3.5 mmol/L (ref 3.5–5.1)
Sodium: 137 mmol/L (ref 135–145)

## 2020-07-15 LAB — CK: Total CK: 24 U/L — ABNORMAL LOW (ref 38–234)

## 2020-07-15 SURGERY — IRRIGATION AND DEBRIDEMENT EXTREMITY
Anesthesia: General | Laterality: Left

## 2020-07-15 MED ORDER — SCOPOLAMINE 1 MG/3DAYS TD PT72: 1.0000 | MEDICATED_PATCH | TRANSDERMAL | Status: DC

## 2020-07-15 MED ORDER — ONDANSETRON HCL 4 MG PO TABS: 4.0000 mg | ORAL_TABLET | Freq: Four times a day (QID) | ORAL | Status: DC | PRN

## 2020-07-15 MED ORDER — HYDROMORPHONE HCL 1 MG/ML IJ SOLN
0.5000 mg | Freq: Once | INTRAMUSCULAR | Status: AC
Start: 2020-07-15 — End: 2020-07-15
  Administered 2020-07-15: 0.5 mg via INTRAVENOUS

## 2020-07-15 MED ORDER — LACTATED RINGERS IV SOLN: INTRAVENOUS | Status: DC | PRN

## 2020-07-15 MED ORDER — GENTAMICIN SULFATE 40 MG/ML IJ SOLN
INTRAMUSCULAR | Status: AC
  Filled 2020-07-15: qty 4

## 2020-07-15 MED ORDER — OXYCODONE HCL 5 MG/5ML PO SOLN: 5.0000 mg | Freq: Once | ORAL | Status: AC | PRN

## 2020-07-15 MED ORDER — MEPERIDINE HCL 25 MG/ML IJ SOLN: 6.2500 mg | INTRAMUSCULAR | Status: DC | PRN

## 2020-07-15 MED ORDER — OXYCODONE HCL 5 MG PO TABS
ORAL_TABLET | ORAL | Status: AC
  Filled 2020-07-15: qty 1

## 2020-07-15 MED ORDER — METHOCARBAMOL 1000 MG/10ML IJ SOLN
500.0000 mg | Freq: Four times a day (QID) | INTRAVENOUS | Status: DC | PRN
  Filled 2020-07-15: qty 5

## 2020-07-15 MED ORDER — HYDROMORPHONE HCL 1 MG/ML IJ SOLN
1.0000 mg | INTRAMUSCULAR | Status: DC | PRN
Start: 2020-07-15 — End: 2020-07-15
  Administered 2020-07-15: 1 mg via INTRAVENOUS
  Filled 2020-07-15 (×2): qty 1

## 2020-07-15 MED ORDER — HYDROMORPHONE HCL 1 MG/ML IJ SOLN
1.0000 mg | Freq: Once | INTRAMUSCULAR | Status: AC
Start: 2020-07-15 — End: 2020-07-15
  Administered 2020-07-15: 1 mg via INTRAVENOUS
  Filled 2020-07-15: qty 1

## 2020-07-15 MED ORDER — CHLORHEXIDINE GLUCONATE 0.12 % MT SOLN
15.0000 mL | OROMUCOSAL | Status: AC
  Filled 2020-07-15: qty 15

## 2020-07-15 MED ORDER — SODIUM CHLORIDE 0.9 % IV SOLN: INTRAVENOUS | Status: DC

## 2020-07-15 MED ORDER — OXYCODONE HCL 5 MG PO TABS
5.0000 mg | ORAL_TABLET | Freq: Once | ORAL | Status: AC | PRN
Start: 2020-07-15 — End: 2020-07-15
  Administered 2020-07-15: 5 mg via ORAL

## 2020-07-15 MED ORDER — HYDROMORPHONE HCL 1 MG/ML IJ SOLN
0.5000 mg | Freq: Once | INTRAMUSCULAR | Status: AC
  Administered 2020-07-15: 0.5 mg via INTRAVENOUS

## 2020-07-15 MED ORDER — MIDAZOLAM HCL 2 MG/2ML IJ SOLN
INTRAMUSCULAR | Status: AC
  Filled 2020-07-15: qty 2

## 2020-07-15 MED ORDER — VANCOMYCIN HCL 1000 MG IV SOLR
INTRAVENOUS | Status: AC
  Filled 2020-07-15: qty 1000

## 2020-07-15 MED ORDER — HYDROMORPHONE HCL 1 MG/ML IJ SOLN
INTRAMUSCULAR | Status: DC | PRN
  Administered 2020-07-15: .5 mg via INTRAVENOUS

## 2020-07-15 MED ORDER — PROPOFOL 10 MG/ML IV BOLUS
INTRAVENOUS | Status: DC | PRN
  Administered 2020-07-15: 100 mg via INTRAVENOUS
  Administered 2020-07-15: 50 mg via INTRAVENOUS
  Administered 2020-07-15: 200 mg via INTRAVENOUS
  Administered 2020-07-15: 30 mg via INTRAVENOUS
  Administered 2020-07-15: 50 mg via INTRAVENOUS

## 2020-07-15 MED ORDER — DEXMEDETOMIDINE (PRECEDEX) IN NS 20 MCG/5ML (4 MCG/ML) IV SYRINGE
PREFILLED_SYRINGE | INTRAVENOUS | Status: DC | PRN
  Administered 2020-07-15: 20 ug via INTRAVENOUS

## 2020-07-15 MED ORDER — LACTATED RINGERS IV SOLN: INTRAVENOUS | Status: DC

## 2020-07-15 MED ORDER — DEXAMETHASONE SODIUM PHOSPHATE 10 MG/ML IJ SOLN
INTRAMUSCULAR | Status: AC
  Filled 2020-07-15: qty 1

## 2020-07-15 MED ORDER — BISACODYL 10 MG RE SUPP: 10.0000 mg | Freq: Every day | RECTAL | Status: DC | PRN

## 2020-07-15 MED ORDER — HYDROMORPHONE HCL 1 MG/ML IJ SOLN
INTRAMUSCULAR | Status: AC
  Filled 2020-07-15: qty 0.5

## 2020-07-15 MED ORDER — VANCOMYCIN HCL 1000 MG IV SOLR
INTRAVENOUS | Status: DC | PRN
  Administered 2020-07-15: 1000 mg

## 2020-07-15 MED ORDER — CEFAZOLIN SODIUM-DEXTROSE 2-4 GM/100ML-% IV SOLN
2.0000 g | INTRAVENOUS | Status: AC
  Administered 2020-07-15: 2 g via INTRAVENOUS
  Filled 2020-07-15: qty 100

## 2020-07-15 MED ORDER — OXYCODONE HCL 5 MG PO TABS
10.0000 mg | ORAL_TABLET | ORAL | Status: DC | PRN
  Administered 2020-07-15: 15 mg via ORAL
  Administered 2020-07-15: 10 mg via ORAL
  Administered 2020-07-16 – 2020-07-18 (×11): 15 mg via ORAL
  Filled 2020-07-15 (×13): qty 3

## 2020-07-15 MED ORDER — HYDROMORPHONE HCL 1 MG/ML IJ SOLN
INTRAMUSCULAR | Status: AC
  Filled 2020-07-15: qty 1

## 2020-07-15 MED ORDER — DEXAMETHASONE SODIUM PHOSPHATE 10 MG/ML IJ SOLN
INTRAMUSCULAR | Status: DC | PRN
  Administered 2020-07-15: 5 mg via INTRAVENOUS

## 2020-07-15 MED ORDER — HYDROMORPHONE HCL 1 MG/ML IJ SOLN
INTRAMUSCULAR | Status: AC
  Administered 2020-07-16: 1 mg via INTRAVENOUS
  Filled 2020-07-15: qty 1

## 2020-07-15 MED ORDER — ONDANSETRON HCL 4 MG/2ML IJ SOLN
INTRAMUSCULAR | Status: DC | PRN
  Administered 2020-07-15: 4 mg via INTRAVENOUS

## 2020-07-15 MED ORDER — MAGNESIUM CITRATE PO SOLN: 1.0000 | Freq: Once | ORAL | Status: DC | PRN

## 2020-07-15 MED ORDER — HYDROMORPHONE HCL 1 MG/ML IJ SOLN
0.5000 mg | INTRAMUSCULAR | Status: DC | PRN
  Administered 2020-07-15 – 2020-07-18 (×4): 1 mg via INTRAVENOUS
  Filled 2020-07-15 (×5): qty 1

## 2020-07-15 MED ORDER — LIDOCAINE 2% (20 MG/ML) 5 ML SYRINGE
INTRAMUSCULAR | Status: AC
  Filled 2020-07-15: qty 5

## 2020-07-15 MED ORDER — SODIUM CHLORIDE 0.9% FLUSH
10.0000 mL | INTRAVENOUS | Status: DC | PRN
Start: 2020-07-15 — End: 2020-07-18

## 2020-07-15 MED ORDER — FENTANYL CITRATE (PF) 100 MCG/2ML IJ SOLN
INTRAMUSCULAR | Status: DC | PRN
  Administered 2020-07-15 (×2): 50 ug via INTRAVENOUS
  Administered 2020-07-15: 100 ug via INTRAVENOUS
  Administered 2020-07-15: 50 ug via INTRAVENOUS

## 2020-07-15 MED ORDER — FENTANYL CITRATE (PF) 250 MCG/5ML IJ SOLN
INTRAMUSCULAR | Status: AC
  Filled 2020-07-15: qty 5

## 2020-07-15 MED ORDER — PROMETHAZINE HCL 25 MG/ML IJ SOLN
6.2500 mg | INTRAMUSCULAR | Status: DC | PRN
  Administered 2020-07-15: 6.25 mg via INTRAVENOUS

## 2020-07-15 MED ORDER — SODIUM CHLORIDE 0.9 % IV SOLN
750.0000 mg | Freq: Every day | INTRAVENOUS | Status: DC
  Administered 2020-07-15 – 2020-07-16 (×2): 750 mg via INTRAVENOUS
  Filled 2020-07-15 (×3): qty 15

## 2020-07-15 MED ORDER — POLYETHYLENE GLYCOL 3350 17 G PO PACK
17.0000 g | PACK | Freq: Every day | ORAL | Status: DC | PRN
Start: 2020-07-15 — End: 2020-07-18

## 2020-07-15 MED ORDER — PROPOFOL 10 MG/ML IV BOLUS
INTRAVENOUS | Status: AC
  Filled 2020-07-15: qty 20

## 2020-07-15 MED ORDER — DEXMEDETOMIDINE (PRECEDEX) IN NS 20 MCG/5ML (4 MCG/ML) IV SYRINGE
PREFILLED_SYRINGE | INTRAVENOUS | Status: AC
  Filled 2020-07-15: qty 5

## 2020-07-15 MED ORDER — METHOCARBAMOL 500 MG PO TABS
500.0000 mg | ORAL_TABLET | Freq: Four times a day (QID) | ORAL | Status: DC | PRN
  Administered 2020-07-16 – 2020-07-18 (×4): 500 mg via ORAL
  Filled 2020-07-15 (×4): qty 1

## 2020-07-15 MED ORDER — METOCLOPRAMIDE HCL 5 MG PO TABS
5.0000 mg | ORAL_TABLET | Freq: Three times a day (TID) | ORAL | Status: DC | PRN
Start: 2020-07-15 — End: 2020-07-18

## 2020-07-15 MED ORDER — 0.9 % SODIUM CHLORIDE (POUR BTL) OPTIME
TOPICAL | Status: DC | PRN
  Administered 2020-07-15: 1000 mL

## 2020-07-15 MED ORDER — OXYCODONE HCL 5 MG PO TABS: 5.0000 mg | ORAL_TABLET | ORAL | Status: DC | PRN

## 2020-07-15 MED ORDER — GENTAMICIN SULFATE 40 MG/ML IJ SOLN
INTRAMUSCULAR | Status: AC
  Filled 2020-07-15: qty 2

## 2020-07-15 MED ORDER — MIDAZOLAM HCL 2 MG/2ML IJ SOLN
0.5000 mg | Freq: Once | INTRAMUSCULAR | Status: AC | PRN
  Administered 2020-07-15: 1 mg via INTRAVENOUS

## 2020-07-15 MED ORDER — GENTAMICIN SULFATE 40 MG/ML IJ SOLN
INTRAMUSCULAR | Status: DC | PRN
  Administered 2020-07-15 (×3): 80 mg

## 2020-07-15 MED ORDER — MIDAZOLAM HCL 5 MG/5ML IJ SOLN
INTRAMUSCULAR | Status: DC | PRN
  Administered 2020-07-15: 2 mg via INTRAVENOUS

## 2020-07-15 MED ORDER — CHLORHEXIDINE GLUCONATE CLOTH 2 % EX PADS
6.0000 | MEDICATED_PAD | Freq: Every day | CUTANEOUS | Status: DC
  Administered 2020-07-16 – 2020-07-18 (×3): 6 via TOPICAL

## 2020-07-15 MED ORDER — LIDOCAINE 2% (20 MG/ML) 5 ML SYRINGE
INTRAMUSCULAR | Status: DC | PRN
  Administered 2020-07-15: 40 mg via INTRAVENOUS

## 2020-07-15 MED ORDER — HYDROMORPHONE HCL 1 MG/ML IJ SOLN
0.2500 mg | INTRAMUSCULAR | Status: DC | PRN
Start: 2020-07-15 — End: 2020-07-15
  Administered 2020-07-15 (×4): 0.5 mg via INTRAVENOUS

## 2020-07-15 MED ORDER — METOCLOPRAMIDE HCL 5 MG/ML IJ SOLN: 5.0000 mg | Freq: Three times a day (TID) | INTRAMUSCULAR | Status: DC | PRN

## 2020-07-15 MED ORDER — SODIUM CHLORIDE 0.9 % IV SOLN
2.0000 g | INTRAVENOUS | Status: DC
  Administered 2020-07-15 – 2020-07-17 (×3): 2 g via INTRAVENOUS
  Filled 2020-07-15 (×3): qty 20

## 2020-07-15 MED ORDER — CHLORHEXIDINE GLUCONATE 0.12 % MT SOLN
OROMUCOSAL | Status: AC
  Administered 2020-07-15: 15 mL via OROMUCOSAL
  Filled 2020-07-15: qty 15

## 2020-07-15 MED ORDER — ACETAMINOPHEN 325 MG PO TABS: 325.0000 mg | ORAL_TABLET | Freq: Four times a day (QID) | ORAL | Status: DC | PRN

## 2020-07-15 MED ORDER — ONDANSETRON HCL 4 MG/2ML IJ SOLN: 4.0000 mg | Freq: Four times a day (QID) | INTRAMUSCULAR | Status: DC | PRN

## 2020-07-15 MED ORDER — OXYCODONE HCL 5 MG PO TABS
5.0000 mg | ORAL_TABLET | ORAL | Status: DC | PRN
  Filled 2020-07-15: qty 2

## 2020-07-15 MED ORDER — PROMETHAZINE HCL 25 MG/ML IJ SOLN
INTRAMUSCULAR | Status: AC
  Filled 2020-07-15: qty 1

## 2020-07-15 MED ORDER — DOCUSATE SODIUM 100 MG PO CAPS
100.0000 mg | ORAL_CAPSULE | Freq: Two times a day (BID) | ORAL | Status: DC
  Administered 2020-07-15 – 2020-07-18 (×5): 100 mg via ORAL
  Filled 2020-07-15 (×7): qty 1

## 2020-07-15 MED ORDER — ONDANSETRON HCL 4 MG/2ML IJ SOLN
INTRAMUSCULAR | Status: AC
  Filled 2020-07-15: qty 2

## 2020-07-15 SURGICAL SUPPLY — 39 items
BLADE SURG 21 STRL SS (BLADE) ×2 IMPLANT
BNDG COHESIVE 4X5 TAN STRL (GAUZE/BANDAGES/DRESSINGS) ×1 IMPLANT
BNDG COHESIVE 6X5 TAN STRL LF (GAUZE/BANDAGES/DRESSINGS) IMPLANT
BNDG GAUZE ELAST 4 BULKY (GAUZE/BANDAGES/DRESSINGS) ×3 IMPLANT
BUR EGG ELITE 4.0 (BURR) ×1 IMPLANT
CANISTER WOUNDNEG PRESSURE 500 (CANNISTER) ×1 IMPLANT
COVER SURGICAL LIGHT HANDLE (MISCELLANEOUS) ×4 IMPLANT
COVER WAND RF STERILE (DRAPES) IMPLANT
DRAPE DERMATAC (DRAPES) ×1 IMPLANT
DRAPE U-SHAPE 47X51 STRL (DRAPES) ×2 IMPLANT
DRESSING PEEL AND PLC PRVNA 13 (GAUZE/BANDAGES/DRESSINGS) IMPLANT
DRSG ADAPTIC 3X8 NADH LF (GAUZE/BANDAGES/DRESSINGS) ×2 IMPLANT
DRSG PEEL AND PLACE PREVENA 13 (GAUZE/BANDAGES/DRESSINGS) ×2
DURAPREP 26ML APPLICATOR (WOUND CARE) ×2 IMPLANT
ELECT REM PT RETURN 9FT ADLT (ELECTROSURGICAL)
ELECTRODE REM PT RTRN 9FT ADLT (ELECTROSURGICAL) IMPLANT
GAUZE SPONGE 4X4 12PLY STRL (GAUZE/BANDAGES/DRESSINGS) ×2 IMPLANT
GLOVE BIOGEL PI IND STRL 9 (GLOVE) ×1 IMPLANT
GLOVE BIOGEL PI INDICATOR 9 (GLOVE) ×1
GLOVE SURG ORTHO 9.0 STRL STRW (GLOVE) ×2 IMPLANT
GOWN STRL REUS W/ TWL XL LVL3 (GOWN DISPOSABLE) ×2 IMPLANT
GOWN STRL REUS W/TWL XL LVL3 (GOWN DISPOSABLE) ×2
HANDPIECE INTERPULSE COAX TIP (DISPOSABLE)
KIT BASIN OR (CUSTOM PROCEDURE TRAY) ×2 IMPLANT
KIT STIMULAN 5CC (Orthopedic Implant) ×2 IMPLANT
KIT TURNOVER KIT B (KITS) ×2 IMPLANT
MANIFOLD NEPTUNE II (INSTRUMENTS) ×2 IMPLANT
NS IRRIG 1000ML POUR BTL (IV SOLUTION) ×2 IMPLANT
PACK ORTHO EXTREMITY (CUSTOM PROCEDURE TRAY) ×2 IMPLANT
PAD ABD 8X10 STRL (GAUZE/BANDAGES/DRESSINGS) ×1 IMPLANT
PAD ARMBOARD 7.5X6 YLW CONV (MISCELLANEOUS) ×4 IMPLANT
SET HNDPC FAN SPRY TIP SCT (DISPOSABLE) IMPLANT
STOCKINETTE IMPERVIOUS 9X36 MD (GAUZE/BANDAGES/DRESSINGS) IMPLANT
SUT ETHILON 2 0 PSLX (SUTURE) ×2 IMPLANT
SWAB COLLECTION DEVICE MRSA (MISCELLANEOUS) ×2 IMPLANT
SWAB CULTURE ESWAB REG 1ML (MISCELLANEOUS) IMPLANT
TOWEL GREEN STERILE (TOWEL DISPOSABLE) ×2 IMPLANT
TUBE CONNECTING 12X1/4 (SUCTIONS) ×2 IMPLANT
YANKAUER SUCT BULB TIP NO VENT (SUCTIONS) ×2 IMPLANT

## 2020-07-15 NOTE — Anesthesia Postprocedure Evaluation (Signed)
Anesthesia Post Note  Patient: Delphine Sizemore  Procedure(s) Performed: PARTIAL EXCISION LEFT TIBIA (Left )     Patient location during evaluation: PACU Anesthesia Type: General Level of consciousness: awake and alert, patient cooperative and oriented Pain control: pt continues to hurt, as did pre-op, but is improving. Vital Signs Assessment: post-procedure vital signs reviewed and stable Respiratory status: spontaneous breathing, nonlabored ventilation and respiratory function stable Cardiovascular status: blood pressure returned to baseline and stable Postop Assessment: no apparent nausea or vomiting Anesthetic complications: no   No complications documented.  Last Vitals:  Vitals:   07/15/20 1330 07/15/20 1409  BP: (!) 142/97 134/88  Pulse: 80 80  Resp: 14 16  Temp: 36.7 C 36.8 C  SpO2: 99% 94%    Last Pain:  Vitals:   07/15/20 1409  TempSrc:   PainSc: 10-Worst pain ever                 Evely Gainey,E. Bernard Donahoo

## 2020-07-15 NOTE — Progress Notes (Signed)
PROGRESS NOTE    Lindsay French  TWS:[id number] DOB: 1981/01/17 DOA: 07/11/2020 PCP: Adrienne Mocha, PA    Chief Complaint  Patient presents with  . Leg Pain    Brief Narrative:  39 year old lady prior history of ADHD, anxiety, depression, HIV on antiretroviral therapy, hypertension, hyperlipidemia, history of AVN, presents with left pretibial edema erythema and worsening pain in the leg.  MRI of the leg showed complex fluid collection with marrow edema suspicious for acute on chronic AVN with infectious complex.  Orthopedics consulted and she was transferred from [facility] to [facility] for partial excision of the left hip by Dr. Lajoyce Corners.  Assessment & Plan:   Principal Problem:   Avascular necrosis of bone (HCC) Active Problems:   ADHD (attention deficit hyperactivity disorder)   Class 3 obesity   Hypertension   Hyperlipidemia   Human immunodeficiency virus (HIV) disease (HCC)   AVN (avascular necrosis of bone) (HCC)   Hypothyroidism   GERD (gastroesophageal reflux disease)   Hepatic steatosis   Thrombocytosis   Macrocytic anemia   Subacute osteomyelitis of left tibia (HCC)   Cellulitis of left leg   Severe protein-calorie malnutrition (HCC)   Acute on chronic AVN with infectious complex MRI of the left lower extremity showed avascular necrosis of the distal femoral metaphysis with surrounding edema and mild periostitis.  Orthopedics consulted and she is transferred to [facility] for excisional debridement of the proximal tibia and placement of antibiotic beads. Bone biopsy attempted by IR on 07/12/2020. Continue with antibiotics for now.    HIV Continue with antiretroviral therapy ID on board.    GERD:  Resume home meds.    Hypertension;  Well controlled.    Hypothyroidism;  Resume synthroid.    ADHD;  Continue with adderall 20 mg daily.    Anxiety ;  Resume xanax 0.5 mg BID prn.   Hypokalemia  replaced.     Macrocytic anemia:  Folate at 6.5   vitamin b12 is 282.  Hemoglobin stable around 9.     Body mass index is 40.17 kg/m. Obesity:  Recommend outpatient follow up with PCP.    DVT prophylaxis: (Lovenox) Code Status: (Full code) Family Communication: none at bedside Disposition:   Status is: Inpatient  Remains inpatient appropriate because:Ongoing diagnostic testing needed not appropriate for outpatient work up and IV treatments appropriate due to intensity of illness or inability to take PO   Dispo: The patient is from: Home              Anticipated d/c is to: pending.               Patient currently is not medically stable to d/c.   Difficult to place patient No       Level of care: Med-Surg Consultants:   Orthopedics.    Procedures: PARTIAL EXCISION LEFT TIBIA to be scheduled today by Dr Lajoyce Corners  Antimicrobials:  Antibiotics Given (last 72 hours)    Date/Time Action Medication Dose   07/12/20 1059 Given   dolutegravir (TIVICAY) tablet 50 mg 50 mg   07/12/20 1100 Given   lamiVUDine (EPIVIR) tablet 300 mg 300 mg   07/13/20 1220 Given   dolutegravir (TIVICAY) tablet 50 mg 50 mg   07/13/20 1220 Given   lamiVUDine (EPIVIR) tablet 300 mg 300 mg   07/14/20 1053 Given   lamiVUDine (EPIVIR) tablet 300 mg 300 mg   07/14/20 1054 Given   dolutegravir (TIVICAY) tablet 50 mg 50 mg  Subjective: Pain well controlled.   Objective: Vitals:   07/14/20 1420 07/14/20 2027 07/15/20 0459 07/15/20 0921  BP: (!) 150/92 134/87 135/87 139/83  Pulse: 89 89 84 73  Resp: 18 18 20 17   Temp: 98.7 F (37.1 C) 98.1 F (36.7 C) 98.8 F (37.1 C) 98.6 F (37 C)  TempSrc: Oral Oral Oral Oral  SpO2: 100% 99% 100% 99%  Height:       No intake or output data in the 24 hours ending 07/15/20 1029 There were no vitals filed for this visit.  Examination:  General exam: Appears calm and comfortable  Respiratory system: Clear to auscultation. Respiratory effort normal. Cardiovascular system: S1 & S2 heard,  RRR.  No pedal edema. Gastrointestinal system: Abdomen is nondistended, soft and nontender.Normal bowel sounds heard. Central nervous system: Alert and oriented. No focal neurological deficits. Extremities: left  Upper part of the leg swelling , redness and tenderness.  Skin: No rashes, lesions or ulcers Psychiatry: Mood & affect appropriate.     Data Reviewed: I have personally reviewed following labs and imaging studies  CBC: Recent Labs  Lab 07/11/20 1812 07/11/20 2219 07/12/20 0311 07/13/20 0330 07/15/20 0901  WBC 9.6 10.4 8.3 7.8 6.7  NEUTROABS 7.1  --   --   --  4.7  HGB 10.4* 10.1* 9.4* 9.1* 9.3*  HCT 33.3* 32.4* 29.9* 29.7* 28.3*  MCV 109.9* 110.6* 110.7* 111.7* 106.8*  PLT 466* 473* 428* 405* 389    Basic Metabolic Panel: Recent Labs  Lab 07/11/20 1812 07/11/20 2219 07/12/20 0311 07/13/20 0330 07/15/20 0901  NA 134* 133* 134* 137 137  K 3.8 3.8 3.8 3.4* 3.5  CL 96* 94* 98 105 105  CO2 28 27 28 25 25   GLUCOSE 97 117* 100* 94 94  BUN <5* 6 7 6  <5*  CREATININE 0.61 0.68 0.61 0.46 0.49  CALCIUM 8.7* 8.6* 8.5* 8.6* 8.6*  MG  --  2.0  --   --   --   PHOS  --  3.6  --   --   --     GFR: Estimated Creatinine Clearance: 113.3 mL/min (by C-G formula based on SCr of 0.49 mg/dL).  Liver Function Tests: Recent Labs  Lab 07/11/20 2219 07/12/20 0311 07/13/20 0330  AST 27  26 21 19   ALT 23  23 21 17   ALKPHOS 115  113 103 88  BILITOT 0.8  0.6 0.7 0.6  PROT 7.4  7.4 6.5 6.0*  ALBUMIN 3.2*  3.1* 2.9* 2.5*    CBG: No results for input(s): GLUCAP in the last 168 hours.   Recent Results (from the past 240 hour(s))  Resp Panel by RT-PCR (Flu A&B, Covid) Nasopharyngeal Swab     Status: None   Collection Time: 07/11/20  6:33 PM   Specimen: Nasopharyngeal Swab; Nasopharyngeal(NP) swabs in vial transport medium  Result Value Ref Range Status   SARS Coronavirus 2 by RT PCR NEGATIVE NEGATIVE Final    Comment: (NOTE) SARS-CoV-2 target nucleic acids are NOT  DETECTED.  The SARS-CoV-2 RNA is generally detectable in upper respiratory specimens during the acute phase of infection. The lowest concentration of SARS-CoV-2 viral copies this assay can detect is 138 copies/mL. A negative result does not preclude SARS-Cov-2 infection and should not be used as the sole basis for treatment or other patient management decisions. A negative result may occur with  improper specimen collection/handling, submission of specimen other than nasopharyngeal swab, presence of viral mutation(s) within the areas targeted by this  assay, and inadequate number of viral copies(<138 copies/mL). A negative result must be combined with clinical observations, patient history, and epidemiological information. The expected result is Negative.  Fact Sheet for Patients:  BloggerCourse.com  Fact Sheet for Healthcare Providers:  SeriousBroker.it  This test is no t yet approved or cleared by the [place] FDA and  has been authorized for detection and/or diagnosis of SARS-CoV-2 by FDA under an Emergency Use Authorization (EUA). This EUA will remain  in effect (meaning this test can be used) for the duration of the COVID-19 declaration under Section 564(b)(1) of the Act, 21 U.S.C.section 360bbb-3(b)(1), unless the authorization is terminated  or revoked sooner.       Influenza A by PCR NEGATIVE NEGATIVE Final   Influenza B by PCR NEGATIVE NEGATIVE Final    Comment: (NOTE) The Xpert Xpress SARS-CoV-2/FLU/RSV plus assay is intended as an aid in the diagnosis of influenza from Nasopharyngeal swab specimens and should not be used as a sole basis for treatment. Nasal washings and aspirates are unacceptable for Xpert Xpress SARS-CoV-2/FLU/RSV testing.  Fact Sheet for Patients: BloggerCourse.com  Fact Sheet for Healthcare Providers: SeriousBroker.it  This test is not yet  approved or cleared by the [place] FDA and has been authorized for detection and/or diagnosis of SARS-CoV-2 by FDA under an Emergency Use Authorization (EUA). This EUA will remain in effect (meaning this test can be used) for the duration of the COVID-19 declaration under Section 564(b)(1) of the Act, 21 U.S.C. section 360bbb-3(b)(1), unless the authorization is terminated or revoked.  Performed at [facility], [address], [place], [place] [address]   Aerobic/Anaerobic Culture (surgical/deep wound)     Status: None (Preliminary result)   Collection Time: 07/12/20  3:14 PM   Specimen: Tissue  Result Value Ref Range Status   Specimen Description   Final    TISSUE LEFT LEG Performed at [facility], [address], [place], [place] [address]    Special Requests   Final    NONE Performed at [facility], [address], [place], [place] [address]    Gram Stain NO WBC SEEN NO ORGANISMS SEEN   Final   Culture   Final    NO GROWTH 2 DAYS NO ANAEROBES ISOLATED; CULTURE IN PROGRESS FOR 5 DAYS Performed at [facility], [address], [place], [place] [address]    Report Status PENDING  Incomplete  Surgical pcr screen     Status: None   Collection Time: 07/15/20  3:36 AM   Specimen: Nasal Mucosa; Nasal Swab  Result Value Ref Range Status   MRSA, PCR NEGATIVE NEGATIVE Final   Staphylococcus aureus NEGATIVE NEGATIVE Final    Comment: (NOTE) The Xpert SA Assay (FDA approved for NASAL specimens in patients 35 years of age and older), is one component of a comprehensive surveillance program. It is not intended to diagnose infection nor to guide or monitor treatment. Performed at [facility], [address] [place], [place] [address]          Radiology Studies: [place] EKG SITE RITE  Result Date: 07/14/2020 If [facility] image not attached, placement could not be confirmed due to current cardiac  rhythm.       Scheduled Meds: . (feeding supplement) PROSource Plus  30 mL Oral BID WC  . amphetamine-dextroamphetamine  20 mg Oral q morning  . vitamin C  1,000 mg Oral Daily  . busPIRone  15 mg Oral  BID  . docusate sodium  100 mg Oral Daily  . dolutegravir  50 mg Oral Daily  . enoxaparin (LOVENOX) injection  40 mg Subcutaneous Q24H  . famotidine  20 mg Oral BID  . feeding supplement  237 mL Oral Q24H  . gabapentin  300 mg Oral QHS  . irbesartan  150 mg Oral QPM  . lamiVUDine  300 mg Oral Daily  . melatonin  15 mg Oral QHS  . polyethylene glycol  17 g Oral Daily  . venlafaxine XR  225 mg Oral QPM   Continuous Infusions: . sodium chloride 100 mL/hr at 07/14/20 1632  .  ceFAZolin (ANCEF) IV       LOS: 3 days        Kathlen ModyVijaya Naiah Donahoe, MD [facility]   To contact the attending provider between 7A-7P or the covering provider during after hours 7P-7A, please log into the web site [email or web address] and access using universal [place] password for that web site. If you do not have the password, please call the hospital operator.  07/15/2020, 10:29 AM

## 2020-07-15 NOTE — Anesthesia Preprocedure Evaluation (Addendum)
Anesthesia Evaluation  Patient identified by MRN, date of birth, ID band Patient awake    Reviewed: Allergy & Precautions, NPO status , Patient's Chart, lab work & pertinent test results  History of Anesthesia Complications Negative for: history of anesthetic complications  Airway Mallampati: II  TM Distance: >3 FB Neck ROM: Full    Dental  (+) Missing, Poor Dentition, Dental Advisory Given   Pulmonary asthma (last inhaler use 04/2020) , former smoker,  07/11/2020 SARS coronavirus NEG Pt had COVID 04/2020   breath sounds clear to auscultation       Cardiovascular hypertension, Pt. on medications (-) angina Rhythm:Regular Rate:Normal     Neuro/Psych PSYCHIATRIC DISORDERS (ADHD) Anxiety Depression negative neurological ROS     GI/Hepatic Neg liver ROS, GERD  Controlled,  Endo/Other  Hypothyroidism Morbid obesityPCOS  Renal/GU negative Renal ROS     Musculoskeletal   Abdominal (+) + obese,   Peds  Hematology  (+) Blood dyscrasia (Hb 9.3), anemia , HIV,   Anesthesia Other Findings   Reproductive/Obstetrics                            Anesthesia Physical Anesthesia Plan  ASA: III  Anesthesia Plan: General   Post-op Pain Management:    Induction: Intravenous  PONV Risk Score and Plan: 3 and Ondansetron, Dexamethasone and Scopolamine patch - Pre-op  Airway Management Planned: LMA  Additional Equipment: None  Intra-op Plan:   Post-operative Plan:   Informed Consent: I have reviewed the patients History and Physical, chart, labs and discussed the procedure including the risks, benefits and alternatives for the proposed anesthesia with the patient or authorized representative who has indicated his/her understanding and acceptance.     Dental advisory given  Plan Discussed with: CRNA and Surgeon  Anesthesia Plan Comments:        Anesthesia Quick Evaluation

## 2020-07-15 NOTE — Anesthesia Procedure Notes (Signed)
Procedure Name: LMA Insertion Date/Time: 07/15/2020 11:28 AM Performed by: Lytle Michaels, CRNA Pre-anesthesia Checklist: Patient identified, Emergency Drugs available, Suction available, Patient being monitored and Timeout performed Patient Re-evaluated:Patient Re-evaluated prior to induction Oxygen Delivery Method: Circle system utilized Preoxygenation: Pre-oxygenation with 100% oxygen Induction Type: IV induction Ventilation: Mask ventilation without difficulty LMA: LMA inserted LMA Size: 4.0 Placement Confirmation: positive ETCO2 and CO2 detector Dental Injury: Teeth and Oropharynx as per pre-operative assessment

## 2020-07-15 NOTE — Transfer of Care (Signed)
Immediate Anesthesia Transfer of Care Note  Patient: Lindsay French  Procedure(s) Performed: PARTIAL EXCISION LEFT TIBIA (Left )  Patient Location: PACU  Anesthesia Type:General  Level of Consciousness: awake  Airway & Oxygen Therapy: Patient Spontanous Breathing and Patient connected to face mask oxygen  Post-op Assessment: Report given to RN and Post -op Vital signs reviewed and stable  Post vital signs: Reviewed and stable  Last Vitals:  Vitals Value Taken Time  BP 148/85 07/15/20 1210  Temp    Pulse 79 07/15/20 1210  Resp 10 07/15/20 1210  SpO2 100 % 07/15/20 1210  Vitals shown include unvalidated device data.  Last Pain:  Vitals:   07/15/20 1004  TempSrc:   PainSc: 8       Patients Stated Pain Goal: 2 (07/13/20 2210)  Complications: No complications documented.

## 2020-07-15 NOTE — Progress Notes (Signed)
Peripherally Inserted Central Catheter Placement  The IV Nurse has discussed with the patient and/or persons authorized to consent for the patient, the purpose of this procedure and the potential benefits and risks involved with this procedure.  The benefits include less needle sticks, lab draws from the catheter, and the patient may be discharged home with the catheter. Risks include, but not limited to, infection, bleeding, blood clot (thrombus formation), and puncture of an artery; nerve damage and irregular heartbeat and possibility to perform a PICC exchange if needed/ordered by physician.  Alternatives to this procedure were also discussed.  Bard Power PICC patient education guide, fact sheet on infection prevention and patient information card has been provided to patient /or left at bedside.    PICC Placement Documentation  PICC Single Lumen 07/15/20 PICC Right Brachial 37 cm 0 cm (Active)  Indication for Insertion or Continuance of Line Prolonged intravenous therapies 07/15/20 1755  Exposed Catheter (cm) 0 cm 07/15/20 1755  Site Assessment Clean;Dry;Intact 07/15/20 1755  Line Status Flushed;Blood return noted;Saline locked 07/15/20 1755  Dressing Type Transparent 07/15/20 1755  Dressing Status Clean;Dry;Intact 07/15/20 1755  Antimicrobial disc in place? Yes 07/15/20 1755  Dressing Change Due 07/22/20 07/15/20 1755       Audrie Gallus 07/15/2020, 5:57 PM

## 2020-07-15 NOTE — Progress Notes (Signed)
Subjective: Still having significant pain   Antibiotics:  Anti-infectives (From admission, onward)   Start     Dose/Rate Route Frequency Ordered Stop   07/15/20 1059  gentamicin (GARAMYCIN) injection          As needed 07/15/20 1100     07/15/20 1058  vancomycin (VANCOCIN) powder          As needed 07/15/20 1059     07/15/20 0800  ceFAZolin (ANCEF) IVPB 2g/100 mL premix        2 g 200 mL/hr over 30 Minutes Intravenous To Short Stay 07/15/20 0719 07/16/20 0800   07/12/20 1000  [MAR Hold]  dolutegravir (TIVICAY) tablet 50 mg        (MAR Hold since Fri 07/15/2020 at 1037.Hold Reason: Transfer to a Procedural area.)   50 mg Oral Daily 07/11/20 2142     07/12/20 1000  [MAR Hold]  lamiVUDine (EPIVIR) tablet 300 mg        (MAR Hold since Fri 07/15/2020 at 1037.Hold Reason: Transfer to a Procedural area.)   300 mg Oral Daily 07/11/20 2142     07/11/20 2115  Dolutegravir-lamiVUDine 50-300 MG TABS 1 tablet  Status:  Discontinued        1 tablet Oral Daily 07/11/20 2114 07/11/20 2141      Medications: Scheduled Meds: . [MAR Hold] (feeding supplement) PROSource Plus  30 mL Oral BID WC  . [MAR Hold] amphetamine-dextroamphetamine  20 mg Oral q morning  . [MAR Hold] vitamin C  1,000 mg Oral Daily  . [MAR Hold] busPIRone  15 mg Oral BID  . [MAR Hold] docusate sodium  100 mg Oral Daily  . [MAR Hold] dolutegravir  50 mg Oral Daily  . [MAR Hold] enoxaparin (LOVENOX) injection  40 mg Subcutaneous Q24H  . [MAR Hold] famotidine  20 mg Oral BID  . [MAR Hold] feeding supplement  237 mL Oral Q24H  . [MAR Hold] gabapentin  300 mg Oral QHS  . [MAR Hold] irbesartan  150 mg Oral QPM  . [MAR Hold] lamiVUDine  300 mg Oral Daily  . [MAR Hold] melatonin  15 mg Oral QHS  . [MAR Hold] polyethylene glycol  17 g Oral Daily  . scopolamine  1 patch Transdermal Q72H  . [MAR Hold] venlafaxine XR  225 mg Oral QPM   Continuous Infusions: . sodium chloride 100 mL/hr at 07/14/20 1632  .  ceFAZolin (ANCEF)  IV    . lactated ringers 10 mL/hr at 07/15/20 1043   PRN Meds:.0.9 % irrigation (POUR BTL), [MAR Hold] acetaminophen **OR** [MAR Hold] acetaminophen, [MAR Hold] ALPRAZolam, gentamicin, [MAR Hold]  HYDROmorphone (DILAUDID) injection, [MAR Hold] oxyCODONE, [MAR Hold] prochlorperazine, vancomycin    Objective: Weight change:  No intake or output data in the 24 hours ending 07/15/20 1131 Blood pressure 139/83, pulse 73, temperature 98.6 F (37 C), temperature source Oral, resp. rate 17, height 5\' 4"  (1.626 m), weight 106.1 kg, SpO2 99 %. Temp:  [98.1 F (36.7 C)-98.8 F (37.1 C)] 98.6 F (37 C) (03/25 0921) Pulse Rate:  [73-89] 73 (03/25 0921) Resp:  [17-20] 17 (03/25 0921) BP: (134-150)/(83-92) 139/83 (03/25 0921) SpO2:  [98 %-100 %] 99 % (03/25 0921) Weight:  [106.1 kg] 106.1 kg (03/25 1040)  Physical Exam: Physical Exam Constitutional:      General: She is not in acute distress.    Appearance: She is well-developed. She is not diaphoretic.  HENT:     Head: Normocephalic and atraumatic.  Right Ear: External ear normal.     Left Ear: External ear normal.     Mouth/Throat:     Pharynx: No oropharyngeal exudate.  Eyes:     General: No scleral icterus.    Conjunctiva/sclera: Conjunctivae normal.     Pupils: Pupils are equal, round, and reactive to light.  Cardiovascular:     Rate and Rhythm: Normal rate and regular rhythm.     Heart sounds: Normal heart sounds. No murmur heard. No friction rub. No gallop.   Pulmonary:     Effort: Pulmonary effort is normal. No respiratory distress.     Breath sounds: Normal breath sounds. No wheezing or rales.  Abdominal:     General: Bowel sounds are normal. There is no distension.     Palpations: Abdomen is soft.     Tenderness: There is no abdominal tenderness. There is no rebound.  Musculoskeletal:        General: No tenderness. Normal range of motion.  Lymphadenopathy:     Cervical: No cervical adenopathy.  Skin:    General:  Skin is warm and dry.     Coloration: Skin is not pale.     Findings: No erythema or rash.  Neurological:     Mental Status: She is alert and oriented to person, place, and time.     Motor: No abnormal muscle tone.     Coordination: Coordination normal.  Psychiatric:        Attention and Perception: Attention normal.        Mood and Affect: Mood is anxious. Affect is tearful.        Behavior: Behavior normal.        Thought Content: Thought content normal.        Judgment: Judgment normal.     Comments: She became tearful when I warned that if the current debridement and antibiotics did not succeed that she might very well need an amputation     Left lower extremity pictured below with significant erythema 07/15/2020:      CBC:    BMET Recent Labs    07/13/20 0330 07/15/20 0901  NA 137 137  K 3.4* 3.5  CL 105 105  CO2 25 25  GLUCOSE 94 94  BUN 6 <5*  CREATININE 0.46 0.49  CALCIUM 8.6* 8.6*     Liver Panel  Recent Labs    07/13/20 0330  PROT 6.0*  ALBUMIN 2.5*  AST 19  ALT 17  ALKPHOS 88  BILITOT 0.6       Sedimentation Rate No results for input(s): ESRSEDRATE in the last 72 hours. C-Reactive Protein No results for input(s): CRP in the last 72 hours.  Micro Results: Recent Results (from the past 720 hour(s))  Resp Panel by RT-PCR (Flu A&B, Covid) Nasopharyngeal Swab     Status: None   Collection Time: 07/11/20  6:33 PM   Specimen: Nasopharyngeal Swab; Nasopharyngeal(NP) swabs in vial transport medium  Result Value Ref Range Status   SARS Coronavirus 2 by RT PCR NEGATIVE NEGATIVE Final    Comment: (NOTE) SARS-CoV-2 target nucleic acids are NOT DETECTED.  The SARS-CoV-2 RNA is generally detectable in upper respiratory specimens during the acute phase of infection. The lowest concentration of SARS-CoV-2 viral copies this assay can detect is 138 copies/mL. A negative result does not preclude SARS-Cov-2 infection and should not be used as the sole  basis for treatment or other patient management decisions. A negative result may occur with  improper specimen collection/handling, submission  of specimen other than nasopharyngeal swab, presence of viral mutation(s) within the areas targeted by this assay, and inadequate number of viral copies(<138 copies/mL). A negative result must be combined with clinical observations, patient history, and epidemiological information. The expected result is Negative.  Fact Sheet for Patients:  BloggerCourse.com  Fact Sheet for Healthcare Providers:  SeriousBroker.it  This test is no t yet approved or cleared by the [place] FDA and  has been authorized for detection and/or diagnosis of SARS-CoV-2 by FDA under an Emergency Use Authorization (EUA). This EUA will remain  in effect (meaning this test can be used) for the duration of the COVID-19 declaration under Section 564(b)(1) of the Act, 21 U.S.C.section 360bbb-3(b)(1), unless the authorization is terminated  or revoked sooner.       Influenza A by PCR NEGATIVE NEGATIVE Final   Influenza B by PCR NEGATIVE NEGATIVE Final    Comment: (NOTE) The Xpert Xpress SARS-CoV-2/FLU/RSV plus assay is intended as an aid in the diagnosis of influenza from Nasopharyngeal swab specimens and should not be used as a sole basis for treatment. Nasal washings and aspirates are unacceptable for Xpert Xpress SARS-CoV-2/FLU/RSV testing.  Fact Sheet for Patients: BloggerCourse.com  Fact Sheet for Healthcare Providers: SeriousBroker.it  This test is not yet approved or cleared by the [place] FDA and has been authorized for detection and/or diagnosis of SARS-CoV-2 by FDA under an Emergency Use Authorization (EUA). This EUA will remain in effect (meaning this test can be used) for the duration of the COVID-19 declaration under Section 564(b)(1) of the Act,  21 U.S.C. section 360bbb-3(b)(1), unless the authorization is terminated or revoked.  Performed at [facility], [address] [place], [place] [address]   Aerobic/Anaerobic Culture (surgical/deep wound)     Status: None (Preliminary result)   Collection Time: 07/12/20  3:14 PM   Specimen: Tissue  Result Value Ref Range Status   Specimen Description   Final    TISSUE LEFT LEG Performed at [facility], [address] [place], [place] [address]    Special Requests   Final    NONE Performed at [facility], [address], [place], [place] [address]    Gram Stain NO WBC SEEN NO ORGANISMS SEEN   Final   Culture   Final    NO GROWTH 3 DAYS NO ANAEROBES ISOLATED; CULTURE IN PROGRESS FOR 5 DAYS Performed at [facility], [address], [place], [place] [address]    Report Status PENDING  Incomplete  Surgical pcr screen     Status: None   Collection Time: 07/15/20  3:36 AM   Specimen: Nasal Mucosa; Nasal Swab  Result Value Ref Range Status   MRSA, PCR NEGATIVE NEGATIVE Final   Staphylococcus aureus NEGATIVE NEGATIVE Final    Comment: (NOTE) The Xpert SA Assay (FDA approved for NASAL specimens in patients 52 years of age and older), is one component of a comprehensive surveillance program. It is not intended to diagnose infection nor to guide or monitor treatment. Performed at [facility], [address], [place], [place] [address]     Studies/Results: [place] EKG SITE RITE  Result Date: 07/14/2020 If [facility] image not attached, placement could not be confirmed due to current cardiac rhythm.     Assessment/Plan:  INTERVAL HISTORY: Patient to go to the operating room today.   Principal Problem:   Avascular necrosis of bone (HCC) Active Problems:   ADHD (attention deficit hyperactivity  disorder)   Class 3 obesity   Hypertension   Hyperlipidemia   Human immunodeficiency virus (HIV) disease (HCC)   AVN  (avascular necrosis of bone) (HCC)   Hypothyroidism   GERD (gastroesophageal reflux disease)   Hepatic steatosis   Thrombocytosis   Macrocytic anemia   Subacute osteomyelitis of left tibia (HCC)   Cellulitis of left leg   Severe protein-calorie malnutrition (HCC)    Lindsay French is a 40 y.o. female with with longstanding HIV has been perfectly controlled most recently on [name] who developed avascular necrosis in both tibias apparently and was evaluated at [facility] orthopedics.  Of note one of the MRIs that was done to confirm her avascular corrosive seen on plain film did remark in February that she might have superimposed malignancy or infection.  She had severe pain in her lower extremities particular the left side which then became erythematous and then began draining purulent material.  She was given Keflex with a little bit of a response.  Ultimately however she required hospitalization was admitted to [facility].  She is seen by my partner Dr. Ninetta Lights who follows her in our clinic for management of her HIV and he was doing consults at [facility].  Patient was transferred to [facility] for removal of infected bone by Dr. Lajoyce Corners and so that cultures can be obtained.  Plan is for trip to the operating room with debridement and cultures today.  She will have PICC line placed.  We can then place her on antibiotics and would prefer daptomycin and ceftriaxone as an empiric regimen  Dr. Drue Second is available this weekend for questions and I will ask you to check on the patient once.  Dr. Luciana Axe or Dr. Elinor Parkinson will be here on Monday.     LOS: 3 days   Acey Lav 07/15/2020, 11:31 AM

## 2020-07-15 NOTE — Interval H&P Note (Signed)
History and Physical Interval Note:  07/15/2020 6:43 AM  Lindsay French  has presented today for surgery, with the diagnosis of Osteomyelitis Left Tibia.  The various methods of treatment have been discussed with the patient and family. After consideration of risks, benefits and other options for treatment, the patient has consented to  Procedure(s): PARTIAL EXCISION LEFT TIBIA (Left) as a surgical intervention.  [name] history has been reviewed, patient examined, no change in status, stable for surgery.  I have reviewed the patient's chart and labs.  Questions were answered to the patient's satisfaction.     [name]

## 2020-07-15 NOTE — Op Note (Signed)
07/11/2020 - 07/15/2020  11:57 AM  PATIENT:  Lindsay French    PRE-OPERATIVE DIAGNOSIS:  Osteomyelitis Left Tibia  POST-OPERATIVE DIAGNOSIS:  Same  PROCEDURE:  PARTIAL EXCISION LEFT TIBIA Antibiotic beads intramedullary of the tibia with 1 g vancomycin and 240 mg gentamicin. Local tissue rearrangement for wound closure 11 x 3 cm.  SURGEON:  [name], MD  PHYSICIAN ASSISTANT:None ANESTHESIA:   General  PREOPERATIVE INDICATIONS:  Lindsay French is a  40 y.o. female with a diagnosis of Osteomyelitis Left Tibia who failed conservative measures and elected for surgical management.    The risks benefits and alternatives were discussed with the patient preoperatively including but not limited to the risks of infection, bleeding, nerve injury, cardiopulmonary complications, the need for revision surgery, among others, and the patient was willing to proceed.  OPERATIVE IMPLANTS: Stimulant antibiotic beads with 1 g vancomycin and 240 mg gentamicin. Abscess fluid and soft tissue sent as one culture with the intramedullary bone sent for a second culture.  @ENCIMAGES @  OPERATIVE FINDINGS: A sinus draining tract, cloacae draining from the tibia.  Bone opening 1 cm in diameter.  Abscess and soft tissue sent for wound culture and the intramedullary bone sent for a second culture.  OPERATIVE PROCEDURE: Patient was brought the operating room and underwent a general anesthetic.  After adequate levels anesthesia were obtained patient's left lower extremity was prepped using DuraPrep draped into a sterile field a timeout was called.  A longitudinal incision was made in the ulcerative tissue was ellipsed out.  This left a wound that was 11 x 3 cm.  Blunt dissection was carried down to the tibia there was a chronic open sinus tract 1 cm diameter, cloacae.  There was an abscess superficial to the tibia and the abscess fluid and soft tissue was sent for 1 culture.  The Rem-B bur was then used to open the window  into the tibia.  A curette was then used to excise the necrotic bone, sequestrum.  The intramedullary canal was irrigated with normal saline and the canal was packed with 1 g of stimulant beads with 1 g vancomycin and 240 mg gentamicin.  Local tissue rearrangement was used to close the wound that was 11 x 3 cm.  A 13 cm Prevena wound VAC was applied this had a good suction fit this was overwrapped with Coban patient was extubated taken the PACU in stable condition.  Debridement type: Excisional Debridement  Side: left  Body Location: tibia   Tools used for debridement: scalpel, curette and rongeur  Pre-debridement Wound size (cm):   Length: 1        Width: 1     Depth: 0   Post-debridement Wound size (cm):   Length: 11        Width: 3     Depth: 3   Debridement depth beyond dead/damaged tissue down to healthy viable tissue: yes  Tissue layer involved: skin, subcutaneous tissue, muscle / fascia, bone  Nature of tissue removed: Slough, Necrotic, Devitalized Tissue, Non-viable tissue and Purulence  Irrigation volume: 1 liter     Irrigation fluid type: Normal Saline        DISCHARGE PLANNING:  Antibiotic duration: Patient will need a PICC line for long-term antibiotics, cultures pending.  Weightbearing: Weightbearing as tolerated on the left leg.  Pain medication: Opioid pathway  Dressing care/ Wound VAC: Continue wound VAC for 1 week  Ambulatory devices: Walker or crutches  Discharge to: Anticipate discharge to home once cultures and  long-term antibiotics are finalized.  Follow-up: In the office 1 week post operative.

## 2020-07-16 DIAGNOSIS — F909 Attention-deficit hyperactivity disorder, unspecified type: Secondary | ICD-10-CM | POA: Diagnosis not present

## 2020-07-16 DIAGNOSIS — L03116 Cellulitis of left lower limb: Secondary | ICD-10-CM | POA: Diagnosis not present

## 2020-07-16 DIAGNOSIS — M8700 Idiopathic aseptic necrosis of unspecified bone: Secondary | ICD-10-CM | POA: Diagnosis not present

## 2020-07-16 DIAGNOSIS — M25562 Pain in left knee: Secondary | ICD-10-CM | POA: Diagnosis not present

## 2020-07-16 MED ORDER — POTASSIUM CHLORIDE CRYS ER 20 MEQ PO TBCR
40.0000 meq | EXTENDED_RELEASE_TABLET | Freq: Once | ORAL | Status: AC
  Administered 2020-07-16: 40 meq via ORAL
  Filled 2020-07-16: qty 2

## 2020-07-16 MED ORDER — KETOROLAC TROMETHAMINE 15 MG/ML IJ SOLN
15.0000 mg | Freq: Four times a day (QID) | INTRAMUSCULAR | Status: AC
  Administered 2020-07-16 – 2020-07-17 (×5): 15 mg via INTRAVENOUS
  Filled 2020-07-16 (×5): qty 1
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
## 2020-07-16 NOTE — TOC Progression Note (Signed)
Transition of Care [facility]) - Progression Note    Patient Details  Name: Lindsay French MRN: [id number] Date of Birth: 01/13/1981  Transition of [facility]) CM/SW Contact  Bess Kinds, RN Phone Number: [phone] 07/16/2020, 3:59 PM  Clinical Narrative:     Spoke with patient at the bedside to discuss transition planning. Patient wanting to transition home asap. Discussed needing IV antibiotics set up - advised of referral to [facility] and pending insurance authorization.   Spoke with Burnett Harry, admissions coordinator, at [facility] approval has been received from [facility]. Patient is still needing HH arrangements. Burnett Harry was advised of patient's readiness to transition home, however, [facility] had been previously been advised yesterday that patient would not be ready for discharge until next week. Shelly to work on coordination of home health for infusion nursing. Patient's insurance carrier, [employer], will be barrier to getting needed home health services.   This NCM spoke with liaisons at [place], [place], and [facility] - all denied referral at this time.   TOC following for transition needs.        Expected Discharge Plan and Services                                                 Social Determinants of Health (SDOH) Interventions    Readmission Risk Interventions No flowsheet data found.

## 2020-07-16 NOTE — Progress Notes (Addendum)
Patient ID: Lindsay French, female   DOB: [birth year], 40 y.o.   MRN: [id number] Patient is postoperative day 1 debridement of abscess and osteomyelitis left tibia.  There is 75 cc in the wound VAC canister.  Patient complains of pain that is not relieved with the combination of Dilaudid and 15 mg of oxycodone.  Discussed the importance of weaning off these high doses of narcotics I will write an order for Toradol.  [place] discharge to home on IV antibiotics with the portable Praveena wound VAC pump I will follow-up in the office in a week.  Recommended 30 mg protein supplement twice a day.  Recommended electrolyte replacement for the cramping pain and recommended vitamin D3 supplement 5000 international units a day.
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
## 2020-07-16 NOTE — Progress Notes (Signed)
PROGRESS NOTE    Lindsay French  LGX:[id number] DOB: 03/29/1981 DOA: 07/11/2020 PCP: Adrienne Mocha, PA    Chief Complaint  Patient presents with  . Leg Pain    Brief Narrative:  40 year old lady prior history of ADHD, anxiety, depression, HIV on antiretroviral therapy, hypertension, hyperlipidemia, history of AVN, presents with left pretibial edema erythema and worsening pain in the leg.  MRI of the leg showed complex fluid collection with marrow edema suspicious for acute on chronic AVN with infectious complex.  Orthopedics consulted and she was transferred [facility] for partial excision of the left hip by Dr. Lajoyce Corners. She underwent debridement of abscess and OM of the left tibia. She is put on wound VAC cannister. Plan for discharge home on IV antibiotics and wound VAC when insurance approves.   Assessment & Plan:   Principal Problem:   Avascular necrosis of bone (HCC) Active Problems:   ADHD (attention deficit hyperactivity disorder)   Class 3 obesity   Hypertension   Hyperlipidemia   Human immunodeficiency virus (HIV) disease (HCC)   AVN (avascular necrosis of bone) (HCC)   Hypothyroidism   GERD (gastroesophageal reflux disease)   Hepatic steatosis   Thrombocytosis   Macrocytic anemia   Chronic osteomyelitis of left tibia with draining sinus (HCC)   Cellulitis of left leg   Severe protein-calorie malnutrition (HCC)   Arthralgia of left lower leg   Acute on chronic AVN with infectious complex MRI of the left lower extremity showed avascular necrosis of the distal femoral metaphysis with surrounding edema and mild periostitis.  Orthopedics consulted and she is transferred to [facility] for excisional debridement of the proximal tibia and placement of antibiotic beads. She underwent debridement of abscess and OM of the left tibia. She is put on wound VAC cannister. Plan for discharge home on IV antibiotics and wound VAC when insurance approves.  Cultures  growing rate staph, sensitivities pending.  Bone biopsy attempted by IR on 07/12/2020. Continue with IV daptomycin and rocephin for now. Pain control with IV dilaudid and oral oxycodone.     HIV Continue with antiretroviral therapy ID on board.    GERD:  Resume home meds.    Hypertension;  Well controlled.    Hypothyroidism;  Resume synthroid.    ADHD;  Continue with adderall 20 mg daily.    Anxiety ;  Resume xanax 0.5 mg BID prn.   Hypokalemia  replaced. Repeat level wnl.     Macrocytic anemia:  Folate at 6.5  vitamin b12 is 282.  Hemoglobin stable around 9.     Body mass index is 40.17 kg/m. Obesity:  Recommend outpatient follow up with PCP.    DVT prophylaxis: (Lovenox) Code Status: (Full code) Family Communication: none at bedside Disposition:   Status is: Inpatient  Remains inpatient appropriate because:Ongoing diagnostic testing needed not appropriate for outpatient work up and IV treatments appropriate due to intensity of illness or inability to take PO   Dispo: The patient is from: Home              Anticipated d/c is to: pending.               Patient currently is not medically stable to d/c.   Difficult to place patient No       Level of care: Med-Surg Consultants:   Orthopedics.    Procedures: PARTIAL EXCISION LEFT TIBIA to be scheduled today by Dr Lajoyce Corners  Antimicrobials:  Antibiotics Given (last 72  hours)    Date/Time Action Medication Dose Rate   07/14/20 1053 Given   lamiVUDine (EPIVIR) tablet 300 mg 300 mg    07/14/20 1054 Given   dolutegravir (TIVICAY) tablet 50 mg 50 mg    07/15/20 1058 Given  [exp 01/2023]   vancomycin (VANCOCIN) powder 1,000 mg    07/15/20 1059 Given  [exp 01/2021]   gentamicin (GARAMYCIN) injection 80 mg    07/15/20 1100 Given  [exp 04/2021]   gentamicin (GARAMYCIN) injection 80 mg    07/15/20 1130 Given   ceFAZolin (ANCEF) IVPB 2g/100 mL premix 2 g    07/15/20 1132 Given  [exp 08/2021]    gentamicin (GARAMYCIN) injection 80 mg    07/15/20 1604 New Bag/Given   cefTRIAXone (ROCEPHIN) 2 g in sodium chloride 0.9 % 100 mL IVPB 2 g 200 mL/hr   07/15/20 1908 New Bag/Given   DAPTOmycin (CUBICIN) 750 mg in sodium chloride 0.9 % IVPB 750 mg 230 mL/hr   07/16/20 1033 Given   dolutegravir (TIVICAY) tablet 50 mg 50 mg    07/16/20 1033 Given   lamiVUDine (EPIVIR) tablet 300 mg 300 mg           Subjective: Pt frustrated with her stay in the hospital and wants to be discharged.   Objective: Vitals:   07/15/20 1904 07/16/20 0000 07/16/20 0409 07/16/20 1000  BP: 140/81 (!) 145/76 119/70 138/85  Pulse: 83 80 82 85  Resp: 17 15 16 15   Temp: 97.8 F (36.6 C) 98.7 F (37.1 C) (!) 97.4 F (36.3 C) 98.8 F (37.1 C)  TempSrc: Oral Oral Oral Oral  SpO2: 94% 98% 96% 100%  Weight:      Height:        Intake/Output Summary (Last 24 hours) at 07/16/2020 1442 Last data filed at 07/16/2020 0700 Gross per 24 hour  Intake 410.11 ml  Output 100 ml  Net 310.11 ml   Filed Weights   07/15/20 1040  Weight: 106.1 kg    Examination:  General exam: Alert and comfortable. Not in distress.  Respiratory system: air entry fair, no wheezing or r honchi.  Cardiovascular system: S1 &S2 RRR, no JVD, no pedal edema.  Gastrointestinal system: Abdomen is soft, nt nd bs+ Central nervous system: Alert and oriented, non focal.  Extremities: left leg bandaged and connected to wound vac Skin: left upper leg bandaged, connected to wound vac.  Psychiatry: anxious, teary. .     Data Reviewed: I have personally reviewed following labs and imaging studies  CBC: Recent Labs  Lab 07/11/20 1812 07/11/20 2219 07/12/20 0311 07/13/20 0330 07/15/20 0901  WBC 9.6 10.4 8.3 7.8 6.7  NEUTROABS 7.1  --   --   --  4.7  HGB 10.4* 10.1* 9.4* 9.1* 9.3*  HCT 33.3* 32.4* 29.9* 29.7* 28.3*  MCV 109.9* 110.6* 110.7* 111.7* 106.8*  PLT 466* 473* 428* 405* 389    Basic Metabolic Panel: Recent Labs  Lab  07/11/20 1812 07/11/20 2219 07/12/20 0311 07/13/20 0330 07/15/20 0901  NA 134* 133* 134* 137 137  K 3.8 3.8 3.8 3.4* 3.5  CL 96* 94* 98 105 105  CO2 28 27 28 25 25   GLUCOSE 97 117* 100* 94 94  BUN <5* 6 7 6  <5*  CREATININE 0.61 0.68 0.61 0.46 0.49  CALCIUM 8.7* 8.6* 8.5* 8.6* 8.6*  MG  --  2.0  --   --   --   PHOS  --  3.6  --   --   --  GFR: Estimated Creatinine Clearance: 113.3 mL/min (by C-G formula based on SCr of 0.49 mg/dL).  Liver Function Tests: Recent Labs  Lab 07/11/20 2219 07/12/20 0311 07/13/20 0330  AST ALT ALKPHOS 115  113 103 88  BILITOT 0.8  0.6 0.7 0.6  PROT 7.4  7.4 6.5 6.0*  ALBUMIN 3.2*  3.1* 2.9* 2.5*    CBG: No results for input(s): GLUCAP in the last 168 hours.   Recent Results (from the past 240 hour(s))  Resp Panel by RT-PCR (Flu A&B, Covid) Nasopharyngeal Swab     Status: None   Collection Time: 07/11/20  6:33 PM   Specimen: Nasopharyngeal Swab; Nasopharyngeal(NP) swabs in vial transport medium  Result Value Ref Range Status   SARS Coronavirus 2 by RT PCR NEGATIVE NEGATIVE Final    Comment: (NOTE) SARS-CoV-2 target nucleic acids are NOT DETECTED.  The SARS-CoV-2 RNA is generally detectable in upper respiratory specimens during the acute phase of infection. The lowest concentration of SARS-CoV-2 viral copies this assay can detect is 138 copies/mL. A negative result does not preclude SARS-Cov-2 infection and should not be used as the sole basis for treatment or other patient management decisions. A negative result may occur with  improper specimen collection/handling, submission of specimen other than nasopharyngeal swab, presence of viral mutation(s) within the areas targeted by this assay, and inadequate number of viral copies(<138 copies/mL). A negative result must be combined with clinical observations, patient history, and epidemiological information. The expected result is Negative.  Fact  Sheet for Patients:  BloggerCourse.com  Fact Sheet for Healthcare Providers:  SeriousBroker.it  This test is no t yet approved or cleared by the [place] FDA and  has been authorized for detection and/or diagnosis of SARS-CoV-2 by FDA under an Emergency Use Authorization (EUA). This EUA will remain  in effect (meaning this test can be used) for the duration of the COVID-19 declaration under Section 564(b)(1) of the Act, 21 U.S.C.section 360bbb-3(b)(1), unless the authorization is terminated  or revoked sooner.       Influenza A by PCR NEGATIVE NEGATIVE Final   Influenza B by PCR NEGATIVE NEGATIVE Final    Comment: (NOTE) The Xpert Xpress SARS-CoV-2/FLU/RSV plus assay is intended as an aid in the diagnosis of influenza from Nasopharyngeal swab specimens and should not be used as a sole basis for treatment. Nasal washings and aspirates are unacceptable for Xpert Xpress SARS-CoV-2/FLU/RSV testing.  Fact Sheet for Patients: BloggerCourse.com  Fact Sheet for Healthcare Providers: SeriousBroker.it  This test is not yet approved or cleared by the [place] FDA and has been authorized for detection and/or diagnosis of SARS-CoV-2 by FDA under an Emergency Use Authorization (EUA). This EUA will remain in effect (meaning this test can be used) for the duration of the COVID-19 declaration under Section 564(b)(1) of the Act, 21 U.S.C. section 360bbb-3(b)(1), unless the authorization is terminated or revoked.  Performed at [facility], [address], [place], [place] [address]   Aerobic/Anaerobic Culture (surgical/deep wound)     Status: None (Preliminary result)   Collection Time: 07/12/20  3:14 PM   Specimen: Tissue  Result Value Ref Range Status   Specimen Description   Final    TISSUE LEFT LEG Performed at [facility], [address]  [place], [place] [address]    Special Requests   Final    NONE Performed at [facility], [address]  [place], [place] [address]    Gram Stain NO WBC SEEN NO ORGANISMS SEEN   Final   Culture   Final    NO GROWTH 4 DAYS NO ANAEROBES ISOLATED; CULTURE IN PROGRESS FOR 5 DAYS Performed at [facility], [address], [place], [place] [address]    Report Status PENDING  Incomplete  Surgical pcr screen     Status: None   Collection Time: 07/15/20  3:36 AM   Specimen: Nasal Mucosa; Nasal Swab  Result Value Ref Range Status   MRSA, PCR NEGATIVE NEGATIVE Final   Staphylococcus aureus NEGATIVE NEGATIVE Final    Comment: (NOTE) The Xpert SA Assay (FDA approved for NASAL specimens in patients 19 years of age and older), is one component of a comprehensive surveillance program. It is not intended to diagnose infection nor to guide or monitor treatment. Performed at [facility], [address], [place], [place] [address]   Aerobic/Anaerobic Culture w Gram Stain (surgical/deep wound)     Status: None (Preliminary result)   Collection Time: 07/15/20 11:41 AM   Specimen: PATH Other; Tissue  Result Value Ref Range Status   Specimen Description TISSUE  Final   Special Requests LEFT WOUND TISSUE SPEC A  Final   Gram Stain   Final    ABUNDANT WBC PRESENT,BOTH PMN AND MONONUCLEAR NO ORGANISMS SEEN    Culture   Final    FEW STAPHYLOCOCCUS AUREUS SUSCEPTIBILITIES TO FOLLOW Performed at [facility], [address] [place], [place] [address]    Report Status PENDING  Incomplete  Aerobic/Anaerobic Culture w Gram Stain (surgical/deep wound)     Status: None (Preliminary result)   Collection Time: 07/15/20 11:45 AM   Specimen: Bone; Tissue  Result Value Ref Range Status   Specimen Description BONE  Final   Special Requests LEFT BONE  Final   Gram Stain   Final    ABUNDANT WBC PRESENT, PREDOMINANTLY PMN NO ORGANISMS SEEN    Culture   Final    RARE  STAPHYLOCOCCUS AUREUS CULTURE REINCUBATED FOR BETTER GROWTH CRITICAL RESULT CALLED TO, READ BACK BY AND VERIFIED WITH: RN Kerby Moors  [phone] FCP Performed at [facility], [address], [place], [place] [address]    Report Status PENDING  Incomplete         Radiology Studies: No results found.      Scheduled Meds: . (feeding supplement) PROSource Plus  30 mL Oral BID WC  . amphetamine-dextroamphetamine  20 mg Oral q morning  . vitamin C  1,000 mg Oral Daily  . busPIRone  15 mg Oral BID  . Chlorhexidine Gluconate Cloth  6 each Topical Daily  . docusate sodium  100 mg Oral BID  . dolutegravir  50 mg Oral Daily  . enoxaparin (LOVENOX) injection  40 mg Subcutaneous Q24H  . famotidine  20 mg Oral BID  . feeding supplement  237 mL Oral Q24H  . gabapentin  300 mg Oral QHS  . irbesartan  150 mg Oral QPM  . ketorolac  15 mg Intravenous Q6H  . lamiVUDine  300 mg Oral Daily  . melatonin  15 mg Oral QHS  . polyethylene glycol  17 g Oral Daily  . venlafaxine XR  225 mg Oral QPM   Continuous Infusions: . sodium chloride 100 mL/hr at 07/15/20 1416  . sodium chloride    . cefTRIAXone (ROCEPHIN)  IV 2 g (07/15/20 1604)  . DAPTOmycin (CUBICIN)  IV 750 mg (  07/15/20 1908)  . lactated ringers 10 mL/hr at 07/15/20 1043  . methocarbamol (ROBAXIN) IV       LOS: 4 days        Kathlen ModyVijaya Necole Minassian, MD [facility]   To contact the attending provider between 7A-7P or the covering provider during after hours 7P-7A, please log into the web site [email or web address] and access using universal [place] password for that web site. If you do not have the password, please call the hospital operator.  07/16/2020, 2:42 PM

## 2020-07-16 NOTE — TOC Progression Note (Addendum)
Transition of Care [facility]) - Progression Note    Patient Details  Name: Elisheba Mcdonnell MRN: [id number] Date of Birth: 10/12/1980  Transition of Care [facility]) CM/SW Contact  Bess Kinds, RN Phone Number: [phone] 07/16/2020, 12:56 PM  Clinical Narrative:     Insurance authorization is still pending for IV antibiotics. IV antibiotic orders sent to [facility] who is in network with patient's insurance [facility]). Referral pending with [facility] for infusion nurse. TOC following for transition needs.   Update: [facility] declined referral for [facility] IV nursing. [facility] no longer accept [facility].        Expected Discharge Plan and Services                                                 Social Determinants of Health (SDOH) Interventions    Readmission Risk Interventions No flowsheet data found.
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
## 2020-07-17 DIAGNOSIS — F909 Attention-deficit hyperactivity disorder, unspecified type: Secondary | ICD-10-CM | POA: Diagnosis not present

## 2020-07-17 DIAGNOSIS — M25562 Pain in left knee: Secondary | ICD-10-CM | POA: Diagnosis not present

## 2020-07-17 DIAGNOSIS — M8700 Idiopathic aseptic necrosis of unspecified bone: Secondary | ICD-10-CM | POA: Diagnosis not present

## 2020-07-17 DIAGNOSIS — L03116 Cellulitis of left lower limb: Secondary | ICD-10-CM | POA: Diagnosis not present

## 2020-07-17 LAB — BASIC METABOLIC PANEL
Anion gap: 8 (ref 5–15)
BUN: 7 mg/dL (ref 6–20)
CO2: 27 mmol/L (ref 22–32)
Calcium: 8.7 mg/dL — ABNORMAL LOW (ref 8.9–10.3)
Chloride: 103 mmol/L (ref 98–111)
Creatinine, Ser: 0.59 mg/dL (ref 0.44–1.00)
GFR, Estimated: >60 mL/min (ref >60)
Glucose, Bld: 91 mg/dL (ref 70–99)
Potassium: 3.5 mmol/L (ref 3.5–5.1)
Sodium: 138 mmol/L (ref 135–145)

## 2020-07-17 LAB — AEROBIC/ANAEROBIC CULTURE W GRAM STAIN (SURGICAL/DEEP WOUND)
Culture: NO GROWTH
Gram Stain: NONE SEEN

## 2020-07-17 MED ORDER — CEFAZOLIN SODIUM-DEXTROSE 2-4 GM/100ML-% IV SOLN
2.0000 g | Freq: Three times a day (TID) | INTRAVENOUS | Status: DC
  Administered 2020-07-17 – 2020-07-18 (×4): 2 g via INTRAVENOUS
  Filled 2020-07-17 (×5): qty 100

## 2020-07-17 MED ORDER — TRAMADOL HCL 50 MG PO TABS
50.0000 mg | ORAL_TABLET | Freq: Two times a day (BID) | ORAL | Status: DC | PRN
  Administered 2020-07-17 – 2020-07-18 (×2): 50 mg via ORAL
  Filled 2020-07-17 (×2): qty 1

## 2020-07-17 NOTE — Progress Notes (Signed)
Pt has requested that lab come back at 0800. She has also requested that we do not do bedside report at shift change so she can rest.

## 2020-07-17 NOTE — Progress Notes (Signed)
PHARMACY CONSULT NOTE FOR:  OUTPATIENT  PARENTERAL ANTIBIOTIC THERAPY (OPAT)  Indication: Osteomyelitis Regimen: Ancef 2gmIV q8h End date: 08/27/20   IV antibiotic discharge orders are pended. To discharging provider:  please sign these orders via discharge navigator,  Select New Orders & click on the button choice - Manage This Unsigned Work.     Thank you for allowing pharmacy to be a part of this patient's care.  Harland German, PharmD Clinical Pharmacist **Pharmacist phone directory can now be found on [email or web address] ([email or web address]).  Listed under [facility].

## 2020-07-17 NOTE — Progress Notes (Addendum)
ID PROGRESS NOTE   24hr: Afebrile; noticing less pain. Has been decreasing her pain medication, has not needed dilaudid. Had picc line placed without difficulty   BP 130/88 (BP Location: Left Arm)   Pulse 80   Temp 98.2 F (36.8 C) (Oral)   Resp 18   Ht _0  (1.626 m)   Wt 106.1 kg   SpO2 100%   BMI 40.17 kg/m  Physical Exam  Constitutional:  oriented to person, place, and time. appears well-developed and well-nourished. No distress.  HENT: [place]/AT, PERRLA, no scleral icterus Mouth/Throat: Oropharynx is clear and moist. No oropharyngeal exudate.  Upper ext: picc line in RUE, c/d/i, mild echymosis in the areas Ext= wound vac in place Neurological: alert and oriented to person, place, and time.  Skin: Skin is warm and dry. No rash noted. No erythema.  Psychiatric: a normal mood and affect.  behavior is normal.   Labs:  CBC    Component Value Date/Time   WBC 6.7 07/15/2020 0901   RBC 2.65 (L) 07/15/2020 0901   HGB 9.3 (L) 07/15/2020 0901   HCT 28.3 (L) 07/15/2020 0901   PLT 389 07/15/2020 0901   MCV 106.8 (H) 07/15/2020 0901   MCH 35.1 (H) 07/15/2020 0901   MCHC 32.9 07/15/2020 0901   RDW 14.5 07/15/2020 0901   LYMPHSABS 1.3 07/15/2020 0901   MONOABS 0.6 07/15/2020 0901   EOSABS 0.0 07/15/2020 0901   BASOSABS 0.0 07/15/2020 7062   basic metabolic panel Lab Results  Component Value Date   NA 138 07/17/2020   K 3.5 07/17/2020   CO2 27 07/17/2020   GLUCOSE 91 07/17/2020   BUN 7 07/17/2020   CREATININE 0.59 07/17/2020   CALCIUM 8.7 (L) 07/17/2020   GFRNONAA >60 07/17/2020   GFRAA 135 03/10/2020    Cultures c/w MSSA  Micro: Staphylococcus aureus      MIC    CIPROFLOXACIN <=0.5 SENSI... Sensitive    CLINDAMYCIN <=0.25 SENS... Sensitive    ERYTHROMYCIN <=0.25 SENS... Sensitive    GENTAMICIN <=0.5 SENSI... Sensitive    Inducible Clindamycin NEGATIVE  Sensitive    OXACILLIN 0.5 SENSITIVE  Sensitive    RIFAMPIN <=0.5 SENSI... Sensitive    TETRACYCLINE <=1  SENSITIVE  Sensitive    TRIMETH/SULFA <=10 SENSIT... Sensitive    VANCOMYCIN <=0.5 SENSI... Sensitive    A/P: Osteomyelitis of left femur.tibia c/b AVN s/p parital excision of left tibia with antibiotic beads  - will change abtx to cefazolin 2gm Iv Q 8hr x 6 wk using 3/26 as day 1 - will place opat order -will check sed rate and crp  Pain management = defer to primary team. Recommend with weekly bmp if using nsaids.  Wound vac = will check cbc to see if any considerable hgb drop  --------------------- Home health orders listed below:   Diagnosis: Osteomyelitis of left femur.tibia c/b AVN s/p parital excision of left tibia with antibiotic beads  Culture Result:MSSA  Allergies  Allergen Reactions  . Guaifenesin Nausea And Vomiting  . Hydrocodone Itching    hot  . Lactose Intolerance (Gi) Other (See Comments)    Upset stomach  . Latex Other (See Comments)    As a teenager when using latex condoms she would get a yeast infection.     OPAT Orders Discharge antibiotics to be given via PICC line Discharge antibiotics: Per pharmacy protocol cefazolin  Duration: 6 wk End Date: May 7  [place] Per Protocol:  Home health RN for IV administration and teaching; PICC line  care and labs.    Labs weekly while on IV antibiotics: _x_ CBC with differential _x_ BMP __ CMP _x_ CRP _x_ ESR  __x Please pull PIC at completion of IV antibiotics   Fax weekly labs to [phone]  Clinic Follow Up Appt: With dr hatcher in 2-3 wk and in 6 wk  @ [facility]

## 2020-07-17 NOTE — Progress Notes (Signed)
PROGRESS NOTE    Lindsay French  PTW:[id number] DOB: [birth year] DOA: 07/11/2020 PCP: Adrienne Mocha, PA    Chief Complaint  Patient presents with  . Leg Pain    Brief Narrative:  40 year old lady prior history of ADHD, anxiety, depression, HIV on antiretroviral therapy, hypertension, hyperlipidemia, history of AVN, presents with left pretibial edema erythema and worsening pain in the leg.  MRI of the leg showed complex fluid collection with marrow edema suspicious for acute on chronic AVN with infectious complex.  Orthopedics consulted and she was transferred from [facility] to [facility] for partial excision of the left hip by Dr. Lajoyce Corners. She underwent debridement of abscess and OM of the left tibia. She is put on wound VAC cannister. Plan for discharge home on IV antibiotics and wound VAC when insurance approves.    Pt seen and examined at bedside. No new complaints.   Assessment & Plan:   Principal Problem:   Avascular necrosis of bone (HCC) Active Problems:   ADHD (attention deficit hyperactivity disorder)   Class 3 obesity   Hypertension   Hyperlipidemia   Human immunodeficiency virus (HIV) disease (HCC)   AVN (avascular necrosis of bone) (HCC)   Hypothyroidism   GERD (gastroesophageal reflux disease)   Hepatic steatosis   Thrombocytosis   Macrocytic anemia   Chronic osteomyelitis of left tibia with draining sinus (HCC)   Cellulitis of left leg   Severe protein-calorie malnutrition (HCC)   Arthralgia of left lower leg   Acute on chronic AVN with infectious complex MRI of the left lower extremity showed avascular necrosis of the distal femoral metaphysis with surrounding edema and mild periostitis.  Orthopedics consulted and she is transferred to [facility] for excisional debridement of the proximal tibia and placement of antibiotic beads. She underwent debridement of abscess and OM of the left tibia. She is put on wound VAC cannister. Plan for discharge home on IV  antibiotics and wound VAC when insurance approves.  Cultures growing rate staph, sensitivities pending, pt remain afebrile, no leukocytosis.  Bone biopsy attempted by IR on 07/12/2020. Continue with IV daptomycin and rocephin for now. Pain control with IV dilaudid and oral oxycodone.     HIV Continue with antiretroviral therapy ID on board.    GERD:  Resume home meds.    Hypertension;  Well controlled bp parameters.    Hypothyroidism;  Resume synthroid.    ADHD;  Continue with adderall 20 mg daily.    Anxiety ;  Resume xanax 0.5 mg BID prn.   Hypokalemia  replaced. Repeat level wnl.     Macrocytic anemia:  Folate at 6.5  vitamin b12 is 282.  Hemoglobin stable around 9.     Body mass index is 40.17 kg/m. Obesity:  Recommend outpatient follow up with PCP.    Constipation;  Resolved.    DVT prophylaxis: (Lovenox) Code Status: (Full code) Family Communication: none at bedside Disposition:   Status is: Inpatient  Remains inpatient appropriate because:Ongoing diagnostic testing needed not appropriate for outpatient work up and IV treatments appropriate due to intensity of illness or inability to take PO   Dispo: The patient is from: Home              Anticipated d/c is to: pending.               Patient currently is not medically stable to d/c.   Difficult to place patient No       Level of care: Med-Surg Consultants:  Orthopedics.    Procedures: PARTIAL EXCISION LEFT TIBIA to be scheduled today by Dr Lajoyce Corners  Antimicrobials:  Antibiotics Given (last 72 hours)    Date/Time Action Medication Dose Rate   07/15/20 1058 Given  [exp 01/2023]   vancomycin (VANCOCIN) powder 1,000 mg    07/15/20 1059 Given  [exp 01/2021]   gentamicin (GARAMYCIN) injection 80 mg    07/15/20 1100 Given  [exp 04/2021]   gentamicin (GARAMYCIN) injection 80 mg    07/15/20 1130 Given   ceFAZolin (ANCEF) IVPB 2g/100 mL premix 2 g    07/15/20 1132 Given  [exp  08/2021]   gentamicin (GARAMYCIN) injection 80 mg    07/15/20 1604 New Bag/Given   cefTRIAXone (ROCEPHIN) 2 g in sodium chloride 0.9 % 100 mL IVPB 2 g 200 mL/hr   07/15/20 1908 New Bag/Given   DAPTOmycin (CUBICIN) 750 mg in sodium chloride 0.9 % IVPB 750 mg 230 mL/hr   07/16/20 1033 Given   dolutegravir (TIVICAY) tablet 50 mg 50 mg    07/16/20 1033 Given   lamiVUDine (EPIVIR) tablet 300 mg 300 mg    07/16/20 1608 New Bag/Given   cefTRIAXone (ROCEPHIN) 2 g in sodium chloride 0.9 % 100 mL IVPB 2 g 200 mL/hr   07/16/20 2206 New Bag/Given   DAPTOmycin (CUBICIN) 750 mg in sodium chloride 0.9 % IVPB 750 mg 230 mL/hr   07/17/20 1023 Given   dolutegravir (TIVICAY) tablet 50 mg 50 mg    07/17/20 1023 Given   lamiVUDine (EPIVIR) tablet 300 mg 300 mg           Subjective: No new complaints.   Objective: Vitals:   07/16/20 1000 07/16/20 1516 07/16/20 2105 07/17/20 0456  BP: 138/85 (!) 154/96 132/77 112/84  Pulse: 85 75 90 90  Resp: Temp: 98.8 F (37.1 C) 98.9 F (37.2 C) 98.2 F (36.8 C) 98.2 F (36.8 C)  TempSrc: Oral Oral Oral Oral  SpO2: 100% 100% 100% 99%  Weight:      Height:        Intake/Output Summary (Last 24 hours) at 07/17/2020 1441 Last data filed at 07/17/2020 1300 Gross per 24 hour  Intake 1070 ml  Output 62 ml  Net 1008 ml   Filed Weights   07/15/20 1040  Weight: 106.1 kg    Examination:  General exam: alert and comfortable.  Respiratory system: Air entry fair, No wheezing heard.  Cardiovascular system: S1S2, RRR, no jVD.  Gastrointestinal system: Abdomen is soft, non tender non distended, bowel sounds. Wnl.  Central nervous system: Alert and oriented, non focal Extremities: left leg bandaged and connected to wound vac Skin: left upper leg bandaged, connected to wound vac.  Psychiatry:mood is appropriate.     Data Reviewed: I have personally reviewed following labs and imaging studies  CBC: Recent Labs  Lab 07/11/20 1812  07/11/20 2219 07/12/20 0311 07/13/20 0330 07/15/20 0901  WBC 9.6 10.4 8.3 7.8 6.7  NEUTROABS 7.1  --   --   --  4.7  HGB 10.4* 10.1* 9.4* 9.1* 9.3*  HCT 33.3* 32.4* 29.9* 29.7* 28.3*  MCV 109.9* 110.6* 110.7* 111.7* 106.8*  PLT 466* 473* 428* 405* 389    Basic Metabolic Panel: Recent Labs  Lab 07/11/20 2219 07/12/20 0311 07/13/20 0330 07/15/20 0901 07/17/20 0500  NA 133* 134* 137 137 138  K 3.8 3.8 3.4* 3.5 3.5  CL 94* 98 105 105 103  CO2 GLUCOSE 117* 100*  94 94 91  BUN 6 7 6  <5* 7  CREATININE 0.68 0.61 0.46 0.49 0.59  CALCIUM 8.6* 8.5* 8.6* 8.6* 8.7*  MG 2.0  --   --   --   --   PHOS 3.6  --   --   --   --     GFR: Estimated Creatinine Clearance: 113.3 mL/min (by C-G formula based on SCr of 0.59 mg/dL).  Liver Function Tests: Recent Labs  Lab 07/11/20 2219 07/12/20 0311 07/13/20 0330  AST 27  26 21 19   ALT 23  23 21 17   ALKPHOS 115  113 103 88  BILITOT 0.8  0.6 0.7 0.6  PROT 7.4  7.4 6.5 6.0*  ALBUMIN 3.2*  3.1* 2.9* 2.5*    CBG: No results for input(s): GLUCAP in the last 168 hours.   Recent Results (from the past 240 hour(s))  Resp Panel by RT-PCR (Flu A&B, Covid) Nasopharyngeal Swab     Status: None   Collection Time: 07/11/20  6:33 PM   Specimen: Nasopharyngeal Swab; Nasopharyngeal(NP) swabs in vial transport medium  Result Value Ref Range Status   SARS Coronavirus 2 by RT PCR NEGATIVE NEGATIVE Final    Comment: (NOTE) SARS-CoV-2 target nucleic acids are NOT DETECTED.  The SARS-CoV-2 RNA is generally detectable in upper respiratory specimens during the acute phase of infection. The lowest concentration of SARS-CoV-2 viral copies this assay can detect is 138 copies/mL. A negative result does not preclude SARS-Cov-2 infection and should not be used as the sole basis for treatment or other patient management decisions. A negative result may occur with  improper specimen collection/handling, submission of specimen other than  nasopharyngeal swab, presence of viral mutation(s) within the areas targeted by this assay, and inadequate number of viral copies(<138 copies/mL). A negative result must be combined with clinical observations, patient history, and epidemiological information. The expected result is Negative.  Fact Sheet for Patients:   Fact Sheet for Healthcare Providers:   This test is no t yet approved or cleared by the 07/13/20 FDA and  has been authorized for detection and/or diagnosis of SARS-CoV-2 by FDA under an Emergency Use Authorization (EUA). This EUA will remain  in effect (meaning this test can be used) for the duration of the COVID-19 declaration under Section 564(b)(1) of the Act, 21 U.S.C.section 360bbb-3(b)(1), unless the authorization is terminated  or revoked sooner.       Influenza A by PCR NEGATIVE NEGATIVE Final   Influenza B by PCR NEGATIVE NEGATIVE Final    Comment: (NOTE) The Xpert Xpress SARS-CoV-2/FLU/RSV plus assay is intended as an aid in the diagnosis of influenza from Nasopharyngeal swab specimens and should not be used as a sole basis for treatment. Nasal washings and aspirates are unacceptable for Xpert Xpress SARS-CoV-2/FLU/RSV testing.  Fact Sheet for Patients: BloggerCourse.com  Fact Sheet for Healthcare Providers: SeriousBroker.it  This test is not yet approved or cleared by the [place] FDA and has been authorized for detection and/or diagnosis of SARS-CoV-2 by FDA under an Emergency Use Authorization (EUA). This EUA will remain in effect (meaning this test can be used) for the duration of the COVID-19 declaration under Section 564(b)(1) of the Act, 21 U.S.C. section 360bbb-3(b)(1), unless the authorization is terminated or revoked.  Performed at [facility], [address], [place], [address]   Aerobic/Anaerobic Culture (surgical/deep wound)     Status: None   Collection Time: 07/12/20  3:14 PM   Specimen:  Tissue  Result Value Ref Range Status   Specimen Description   Final    TISSUE LEFT LEG Performed at [facility], [address], [place], [place] [address]    Special Requests   Final    NONE Performed at [facility], [address] [place], [place] [address]    Gram Stain NO WBC SEEN NO ORGANISMS SEEN   Final   Culture   Final    No growth aerobically or anaerobically. Performed at [facility], [address] [place], [place] [address]    Report Status 07/17/2020 FINAL  Final  Surgical pcr screen     Status: None   Collection Time: 07/15/20  3:36 AM   Specimen: Nasal Mucosa; Nasal Swab  Result Value Ref Range Status   MRSA, PCR NEGATIVE NEGATIVE Final   Staphylococcus aureus NEGATIVE NEGATIVE Final    Comment: (NOTE) The Xpert SA Assay (FDA approved for NASAL specimens in patients 66 years of age and older), is one component of a comprehensive surveillance program. It is not intended to diagnose infection nor to guide or monitor treatment. Performed at [facility], [address], [place], [place] [address]   Aerobic/Anaerobic Culture w Gram Stain (surgical/deep wound)     Status: None (Preliminary result)   Collection Time: 07/15/20 11:41 AM   Specimen: PATH Other; Tissue  Result Value Ref Range Status   Specimen Description TISSUE  Final   Special Requests LEFT WOUND TISSUE SPEC A  Final   Gram Stain   Final    ABUNDANT WBC PRESENT,BOTH PMN AND MONONUCLEAR NO ORGANISMS SEEN Performed at [facility], [address] [place], [place] [address]    Culture   Final    FEW STAPHYLOCOCCUS AUREUS CRITICAL VALUE NOTED.  VALUE IS CONSISTENT WITH PREVIOUSLY REPORTED AND CALLED VALUE. NO ANAEROBES ISOLATED; CULTURE IN PROGRESS FOR 5 DAYS    Report Status PENDING  Incomplete    Organism ID, Bacteria STAPHYLOCOCCUS AUREUS  Final      Susceptibility   Staphylococcus aureus - MIC*    CIPROFLOXACIN <=0.5 SENSITIVE Sensitive     ERYTHROMYCIN <=0.25 SENSITIVE Sensitive     GENTAMICIN <=0.5 SENSITIVE Sensitive     OXACILLIN 0.5 SENSITIVE Sensitive     TETRACYCLINE <=1 SENSITIVE Sensitive     VANCOMYCIN <=0.5 SENSITIVE Sensitive     TRIMETH/SULFA <=10 SENSITIVE Sensitive     CLINDAMYCIN <=0.25 SENSITIVE Sensitive     RIFAMPIN <=0.5 SENSITIVE Sensitive     Inducible Clindamycin NEGATIVE Sensitive     * FEW STAPHYLOCOCCUS AUREUS  Aerobic/Anaerobic Culture w Gram Stain (surgical/deep wound)     Status: None (Preliminary result)   Collection Time: 07/15/20 11:45 AM   Specimen: Bone; Tissue  Result Value Ref Range Status   Specimen Description BONE  Final   Special Requests LEFT BONE  Final   Gram Stain   Final    ABUNDANT WBC PRESENT, PREDOMINANTLY PMN NO ORGANISMS SEEN Performed at [facility], [address], [place], [place] [address]    Culture   Final    RARE STAPHYLOCOCCUS AUREUS SUSCEPTIBILITIES TO FOLLOW CRITICAL RESULT CALLED TO, READ BACK BY AND VERIFIED WITH: RN Kerby Moors  [id number] FCP NO ANAEROBES ISOLATED; CULTURE IN PROGRESS FOR 5 DAYS    Report Status PENDING  Incomplete         Radiology Studies: No results found.      Scheduled Meds: . (feeding supplement)  PROSource Plus  30 mL Oral BID WC  . amphetamine-dextroamphetamine  20 mg Oral q morning  . vitamin C  1,000 mg Oral Daily  . busPIRone  15 mg Oral BID  . Chlorhexidine Gluconate Cloth  6 each Topical Daily  . docusate sodium  100 mg Oral BID  . dolutegravir  50 mg Oral Daily  . enoxaparin (LOVENOX) injection  40 mg Subcutaneous Q24H  . famotidine  20 mg Oral BID  . feeding supplement  237 mL Oral Q24H  . gabapentin  300 mg Oral QHS  . irbesartan  150 mg Oral QPM  . lamiVUDine  300 mg Oral Daily  . melatonin  15 mg Oral QHS  . polyethylene glycol  17 g Oral Daily   . venlafaxine XR  225 mg Oral QPM   Continuous Infusions: . sodium chloride 100 mL/hr at 07/15/20 1416  . sodium chloride    . cefTRIAXone (ROCEPHIN)  IV 2 g (07/16/20 1608)  . DAPTOmycin (CUBICIN)  IV 750 mg (07/16/20 2206)  . lactated ringers 10 mL/hr at 07/15/20 1043  . methocarbamol (ROBAXIN) IV       LOS: 5 days        Kathlen ModyVijaya Avery Eustice, MD [facility]   To contact the attending provider between 7A-7P or the covering provider during after hours 7P-7A, please log into the web site [email or web address] and access using universal [place] password for that web site. If you do not have the password, please call the hospital operator.  07/17/2020, 2:41 PM

## 2020-07-17 NOTE — Progress Notes (Signed)
Pt has requested not to be awakened for vital signs and labs

## 2020-07-18 ENCOUNTER — Telehealth: Payer: Self-pay

## 2020-07-18 ENCOUNTER — Encounter (HOSPITAL_COMMUNITY): Payer: Self-pay | Admitting: Orthopedic Surgery

## 2020-07-18 ENCOUNTER — Inpatient Hospital Stay (HOSPITAL_COMMUNITY): Payer: 59

## 2020-07-18 DIAGNOSIS — B9689 Other specified bacterial agents as the cause of diseases classified elsewhere: Secondary | ICD-10-CM

## 2020-07-18 DIAGNOSIS — L02416 Cutaneous abscess of left lower limb: Secondary | ICD-10-CM

## 2020-07-18 LAB — BASIC METABOLIC PANEL
Anion gap: 7 (ref 5–15)
BUN: <5 mg/dL — ABNORMAL LOW (ref 6–20)
CO2: 27 mmol/L (ref 22–32)
Calcium: 9.2 mg/dL (ref 8.9–10.3)
Chloride: 102 mmol/L (ref 98–111)
Creatinine, Ser: 0.62 mg/dL (ref 0.44–1.00)
GFR, Estimated: >60 mL/min (ref >60)
Glucose, Bld: 90 mg/dL (ref 70–99)
Potassium: 4 mmol/L (ref 3.5–5.1)
Sodium: 136 mmol/L (ref 135–145)

## 2020-07-18 LAB — CBC
HCT: 26.7 % — ABNORMAL LOW (ref 36.0–46.0)
Hemoglobin: 8.3 g/dL — ABNORMAL LOW (ref 12.0–15.0)
MCH: 33.3 pg (ref 26.0–34.0)
MCHC: 31.1 g/dL (ref 30.0–36.0)
MCV: 107.2 fL — ABNORMAL HIGH (ref 80.0–100.0)
Platelets: 412 10*3/uL — ABNORMAL HIGH (ref 150–400)
RBC: 2.49 MIL/uL — ABNORMAL LOW (ref 3.87–5.11)
RDW: 14.7 % (ref 11.5–15.5)
WBC: 8.6 10*3/uL (ref 4.0–10.5)
nRBC: 0 % (ref 0.0–0.2)

## 2020-07-18 LAB — SEDIMENTATION RATE: Sed Rate: 128 mm/hr — ABNORMAL HIGH (ref 0–22)

## 2020-07-18 LAB — C-REACTIVE PROTEIN: CRP: 3.9 mg/dL — ABNORMAL HIGH (ref <1.0)

## 2020-07-18 IMAGING — DX DG TIBIA/FIBULA 2V*L*
4 series · 4 of 4 positions shown · non-contrast
Comparison: MRI [DATE]

CLINICAL DATA: Pain. Felt a pop in left leg while walking to the
bathroom yesterday.

EXAM:
LEFT TIBIA AND FIBULA - 2 VIEW

[tibia ap (1 of 2)]
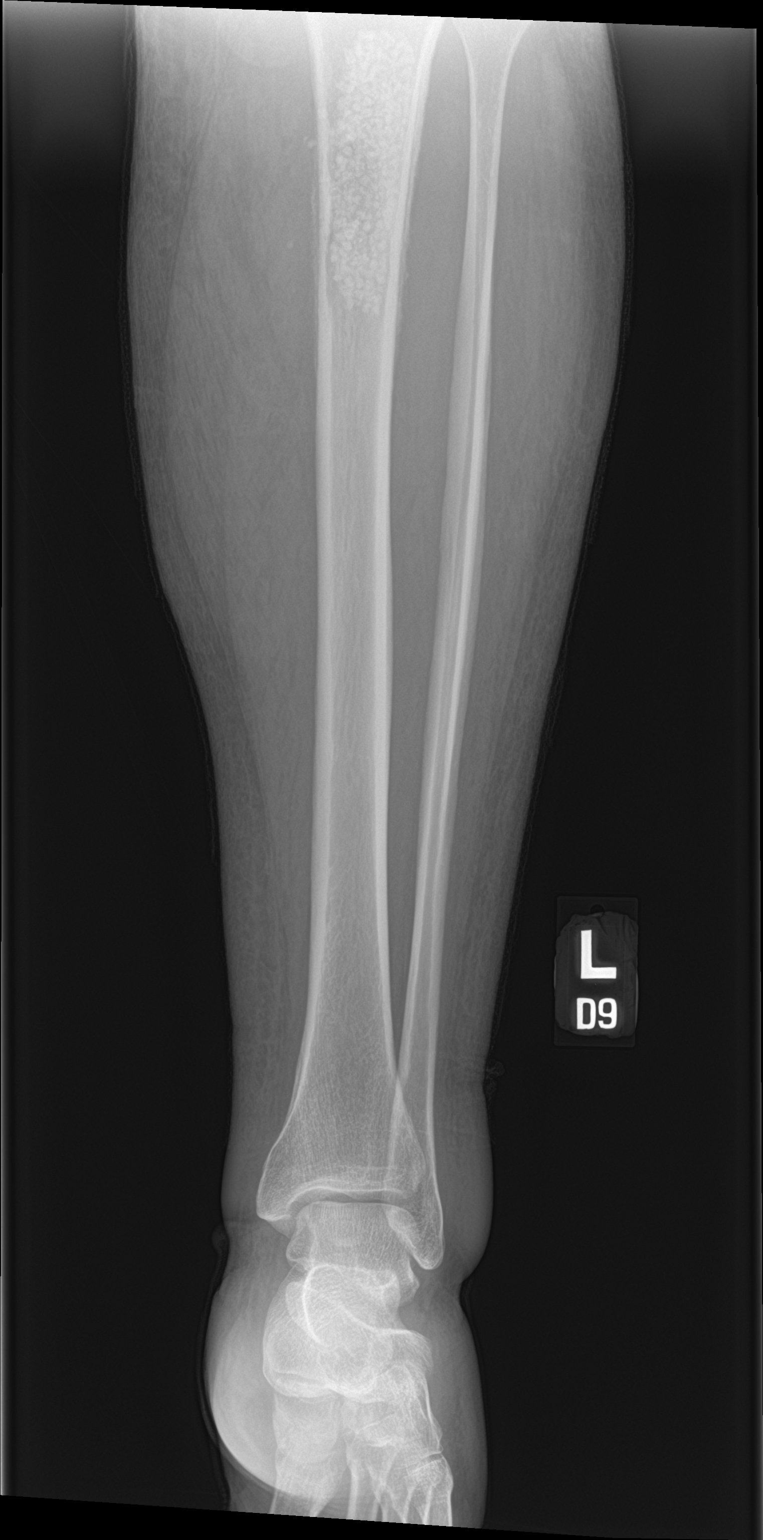

[tibia ap (2 of 2)]
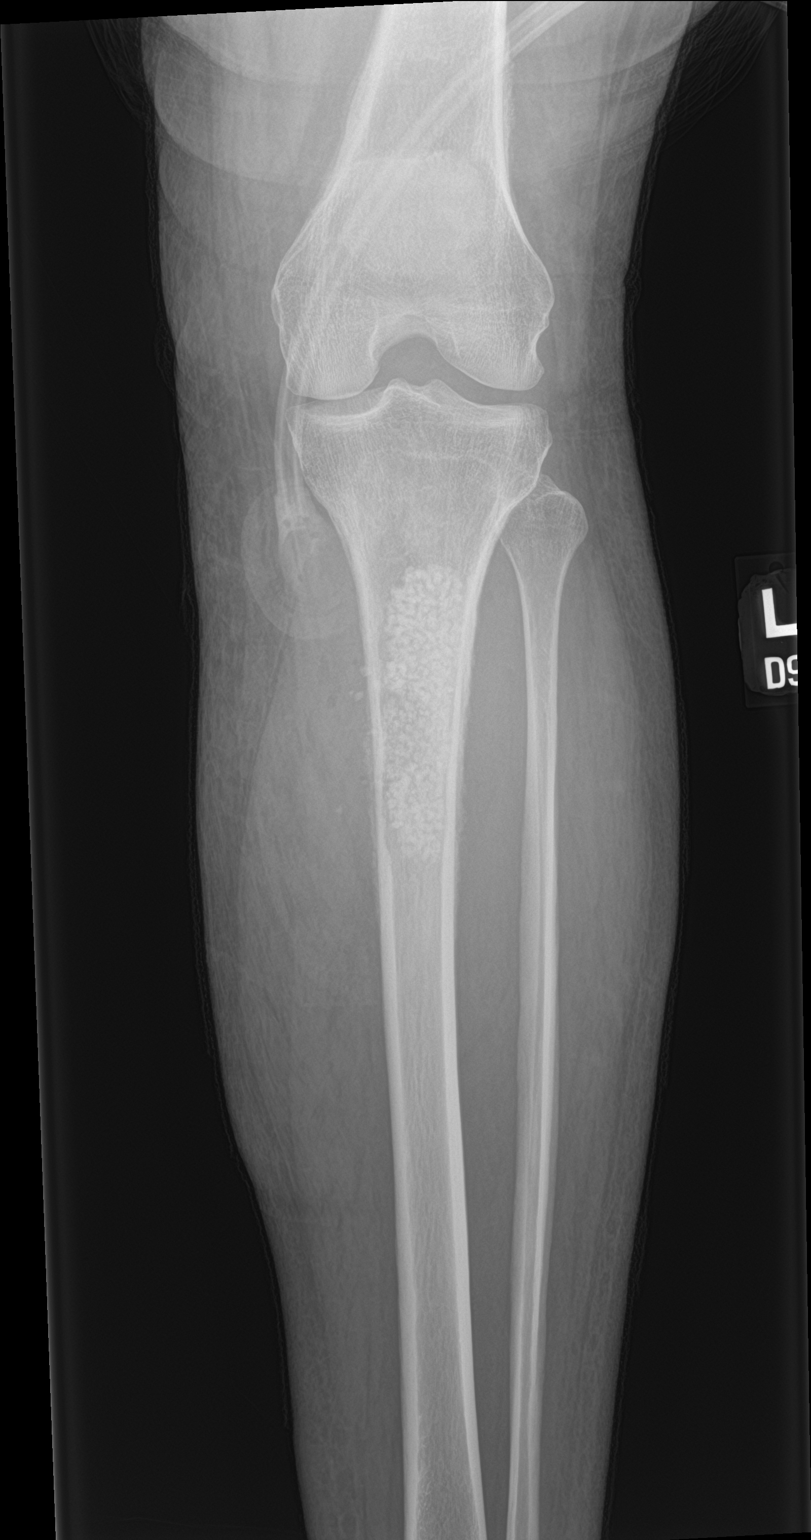

[tibia lat (1 of 2)]
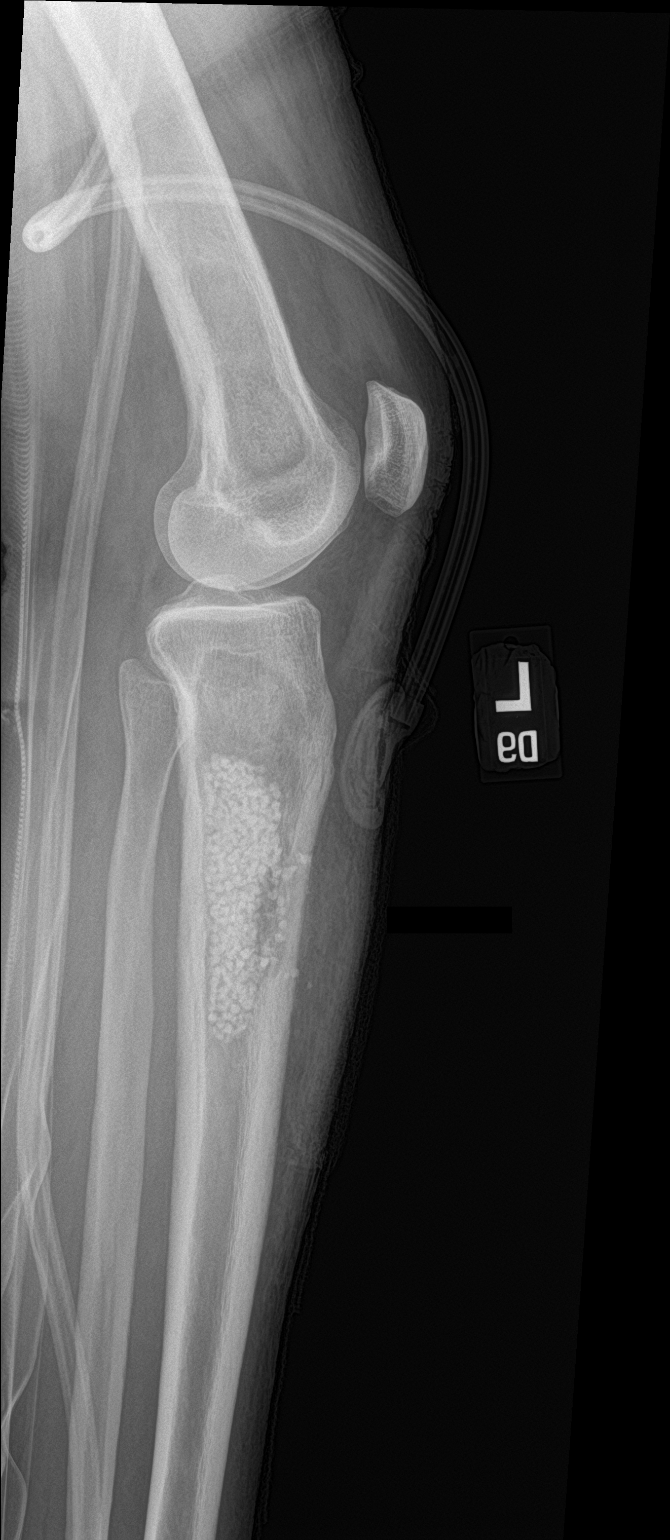

[tibia lat (2 of 2)]
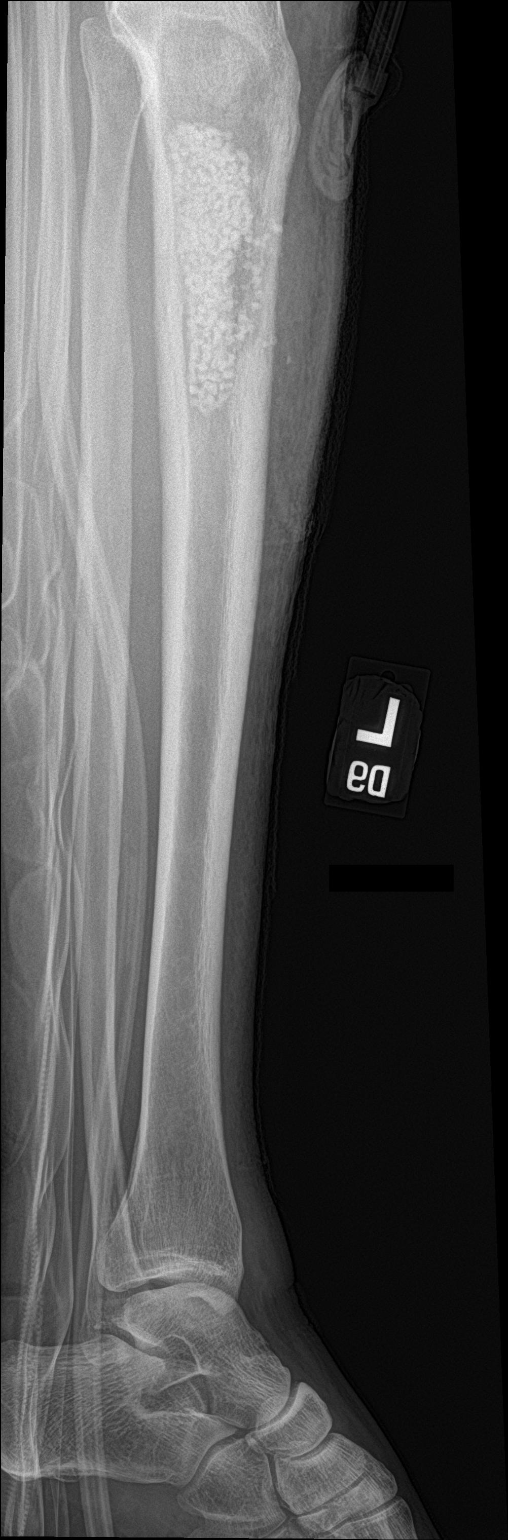

[4 of 4 positions shown; findings below may reference images not displayed]

FINDINGS: Bone packing/antibiotic beads within the proximal tibial metaphysis.
Associated periosteal thickening as seen on MR VADEL. There is no
evidence of acute fracture. Overlying wound VAC in place. There is
generalized soft tissue edema.
IMPRESSION: Postsurgical change with bone packing/antibiotic beads within the
proximal tibial metaphysis at site of prior bone infarct. No
evidence of acute fracture.

## 2020-07-18 IMAGING — DX DG ANKLE COMPLETE 3+V*L*
3 series · 3 of 3 positions shown · non-contrast
Comparison: None.

CLINICAL DATA: Ankle pain. Felt a pop in left leg while walking to
the bathroom yesterday.

EXAM:
LEFT ANKLE COMPLETE - 3+ VIEW

[ankle ap]
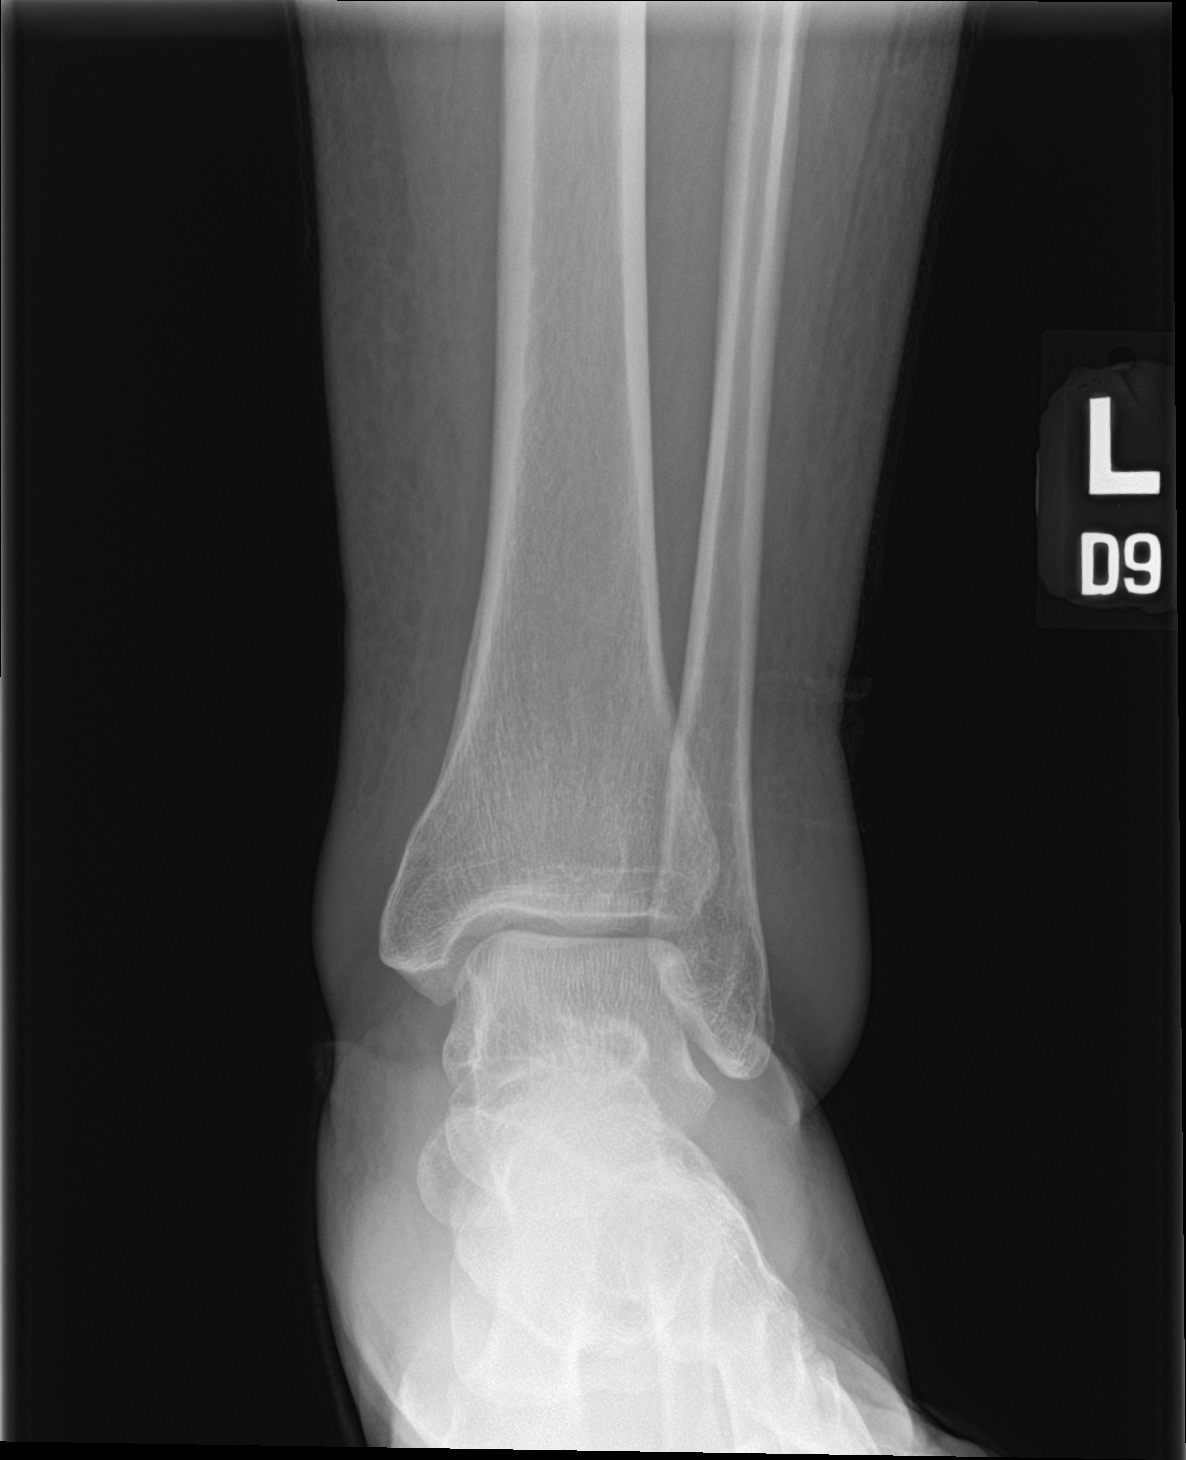

[ankle obl]
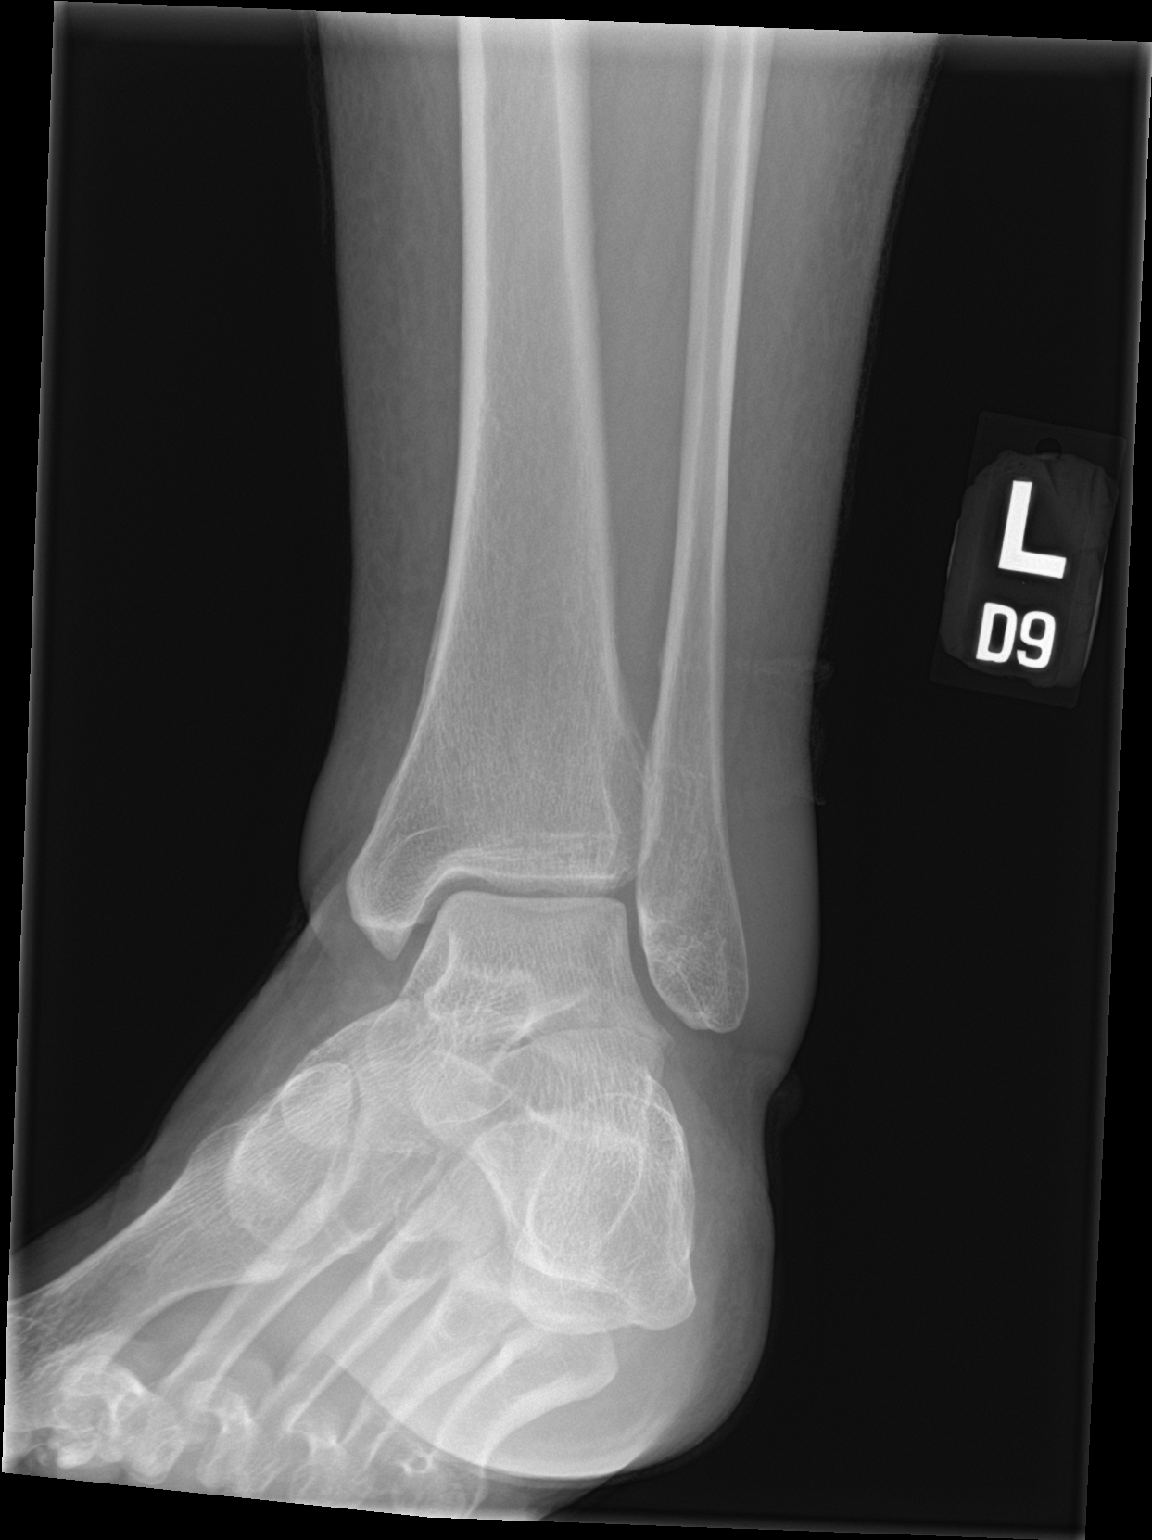

[ankle lat]
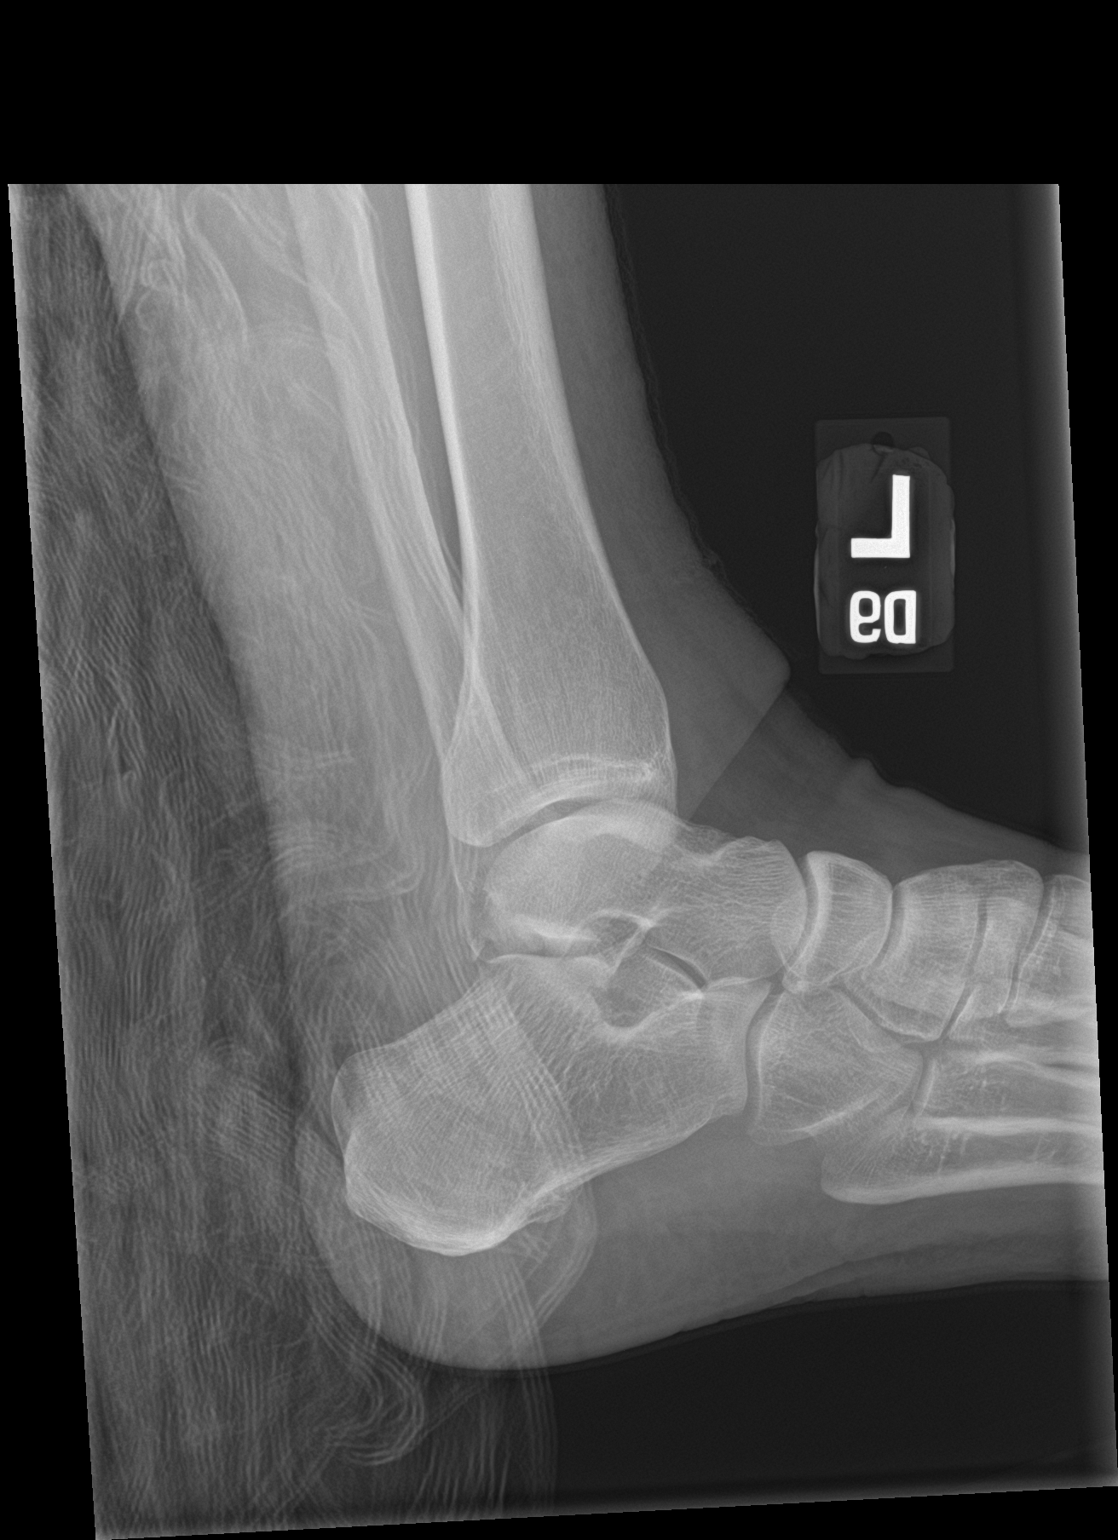

[3 of 3 positions shown; findings below may reference images not displayed]

FINDINGS: There is no evidence of fracture, dislocation, or joint effusion. No
visualized bone infarct or evidence of a vascular necrosis. There is
no evidence of arthropathy or other focal bone abnormality.
Generalized soft tissue edema.
IMPRESSION: Generalized soft tissue edema. No osseous abnormality.

## 2020-07-18 MED ORDER — HEPARIN SOD (PORK) LOCK FLUSH 100 UNIT/ML IV SOLN
250.0000 [IU] | INTRAVENOUS | Status: DC | PRN
  Filled 2020-07-18: qty 2.5

## 2020-07-18 MED ORDER — PROSOURCE PLUS PO LIQD: 30.0000 mL | Freq: Two times a day (BID) | ORAL | 3 refills | Status: DC

## 2020-07-18 MED ORDER — METHOCARBAMOL 500 MG PO TABS: 500.0000 mg | ORAL_TABLET | Freq: Four times a day (QID) | ORAL | 0 refills | Status: DC | PRN

## 2020-07-18 MED ORDER — CEFAZOLIN IV (FOR PTA / DISCHARGE USE ONLY): 2.0000 g | Freq: Three times a day (TID) | INTRAVENOUS | 3 refills | Status: DC

## 2020-07-18 MED ORDER — KETOROLAC TROMETHAMINE 10 MG PO TABS: 10.0000 mg | ORAL_TABLET | Freq: Three times a day (TID) | ORAL | 0 refills | Status: DC | PRN

## 2020-07-18 MED ORDER — KETOROLAC TROMETHAMINE 10 MG PO TABS
10.0000 mg | ORAL_TABLET | Freq: Three times a day (TID) | ORAL | Status: DC | PRN
  Administered 2020-07-18: 10 mg via ORAL
  Filled 2020-07-18 (×2): qty 1

## 2020-07-18 MED ORDER — OXYCODONE HCL 10 MG PO TABS: 10.0000 mg | ORAL_TABLET | ORAL | 0 refills | Status: AC | PRN

## 2020-07-18 NOTE — TOC Progression Note (Addendum)
Transition of Care [facility]) - Progression Note    Patient Details  Name: Bettye Sitton MRN: [id number] Date of Birth: 09-Apr-1981  Transition of [facility] CM/SW Contact  Nadene Rubins Adria Devon, RN Phone Number: 07/18/2020, 1:45 PM  Clinical Narrative:     Sherron Monday to Eston Esters with [facility]. She has arranged for [facility] to send a [facility] to patient's home tomorrow between 9 and 9:30 am.   Patient to receive this evenings dose here at hospital prior to discharge.  Emailed OPAT script to Vernona Rieger    1610 ordered 3 in1   Expected Discharge Plan: Home w Home Health Services Barriers to Discharge: No Barriers Identified  Expected Discharge Plan and Services Expected Discharge Plan: Home w Home Health Services In-house Referral: NA Discharge Planning Services: CM Consult Post Acute Care Choice: Home Health Living arrangements for the past 2 months: Single Family Home                 DME Arranged: N/A DME Agency: NA       HH Arranged: NA           Social Determinants of Health (SDOH) Interventions    Readmission Risk Interventions No flowsheet data found.

## 2020-07-18 NOTE — Care Management (Signed)
Left message for Vernona Rieger with  [facility], for update on home infusion and nursing. Awaiting call back.   Ronny Flurry RN

## 2020-07-18 NOTE — Progress Notes (Signed)
Physical Therapy Treatment Patient Details Name: Lindsay French MRN: [id number] DOB: 1981-03-10 Today's Date: 07/18/2020    History of Present Illness Lindsay French is a 40 y.o. female who is coming to the emergency department referred by her ID specialist (Dr. Johny Sax) after having exacerbation of pain of left pretibial area associated with edema and erythema. MRI ordered by Dr. Ninetta Lights and demonstrated complex fluid collection with marrow edema suspicious for acute on chronic AVN with infectious complex. S/P partial excision of L tibia. PMH significant of ADHD, anxiety, depression, asthma, fatty liver disease, HIV on antiretroviral therapy, hyperlipidemia, hypertension, hypothyroidism,  polycystic ovarian syndrome, history of pneumonia, diagnosed with AVN on 02/2020.    PT Comments    Pt progressing towards goals. Reporting increased pain which limited mobility. Was able to practice short distance gait and stair navigation. Educated about safe management of steps at home. Recommending 3 in 1 for home use; updated recommendations and notified case manager. Will continue to follow acutely.    Follow Up Recommendations  Follow surgeon's recommendation for DC plan and follow-up therapies     Equipment Recommendations  3in1 (PT)    Recommendations for Other Services       Precautions / Restrictions Precautions Precautions: Fall Restrictions Weight Bearing Restrictions: Yes LLE Weight Bearing: Weight bearing as tolerated    Mobility  Bed Mobility Overal bed mobility: Modified Independent                  Transfers Overall transfer level: Modified independent                  Ambulation/Gait Ambulation/Gait assistance: Min guard;Supervision Gait Distance (Feet): 30 Feet Assistive device: None;Rolling walker (2 wheeled) Gait Pattern/deviations: Step-to pattern;Decreased step length - right;Decreased step length - left;Decreased weight shift to left;Antalgic Gait  velocity: Decreased   General Gait Details: Antalgic gait. Pt reporting increased pain. Improved balance noted with use of RW. Pt reports she has one at home to use.   Stairs Stairs: Yes Stairs assistance: Min guard Stair Management: Sideways;One rail Left;Step to pattern Number of Stairs: 2 General stair comments: Min guard for safety. Cues for LE sequencing.   Wheelchair Mobility    Modified Rankin (Stroke Patients Only)       Balance Overall balance assessment: Needs assistance Sitting-balance support: No upper extremity supported;Feet supported Sitting balance-Leahy Scale: Good     Standing balance support: No upper extremity supported;Bilateral upper extremity supported Standing balance-Leahy Scale: Fair                              Cognition Arousal/Alertness: Awake/alert Behavior During Therapy: WFL for tasks assessed/performed Overall Cognitive Status: Within Functional Limits for tasks assessed                                        Exercises      General Comments        Pertinent Vitals/Pain Pain Assessment: Faces Pain Score: 2  Faces Pain Scale: Hurts even more Pain Location: LLE Pain Descriptors / Indicators: Sore Pain Intervention(s): Monitored during session;Limited activity within patient's tolerance;Repositioned    Home Living                      Prior Function            PT Goals (current  goals can now be found in the care plan section) Acute Rehab PT Goals Patient Stated Goal: to go home PT Goal Formulation: With patient/family Time For Goal Achievement: 07/19/20 Potential to Achieve Goals: Good Progress towards PT goals: Progressing toward goals    Frequency    Min 3X/week      PT Plan Frequency needs to be updated;Equipment recommendations need to be updated    Co-evaluation              AM-PAC PT "6 Clicks" Mobility   Outcome Measure  Help needed turning from your back to  your side while in a flat bed without using bedrails?: None Help needed moving from lying on your back to sitting on the side of a flat bed without using bedrails?: None Help needed moving to and from a bed to a chair (including a wheelchair)?: None Help needed standing up from a chair using your arms (e.g., wheelchair or bedside chair)?: None Help needed to walk in hospital room?: A Little Help needed climbing 3-5 steps with a railing? : A Little 6 Click Score: 22    End of Session   Activity Tolerance: Patient limited by pain Patient left: in bed;with call bell/phone within reach;with family/visitor present Nurse Communication: Mobility status PT Visit Diagnosis: Other abnormalities of gait and mobility (R26.89);Pain;Difficulty in walking, not elsewhere classified (R26.2) Pain - Right/Left: Left Pain - part of body: Leg     Time: 3295-1884 PT Time Calculation (min) (ACUTE ONLY): 23 min  Charges:  $Gait Training: 23-37 mins                     Cindee Salt, DPT  Acute Rehabilitation Services  Pager: [phone] Office: [phone]    Lehman Prom 07/18/2020, 4:31 PM

## 2020-07-18 NOTE — Telephone Encounter (Signed)
Called patient to get an appointment scheduled with Dr. Ninetta Lights in 3 weeks, and then 6 weeks.. Patient will call back to get that scheduled

## 2020-07-18 NOTE — Progress Notes (Signed)
[facility] Infectious Diseases Follow Up Note  Patient Identification: Patient Name: Lindsay French MRN: [id number] Admit Date: 07/11/2020  5:38 PM Age: 40 y.o.Today's Date: 07/18/2020   Reason for Visit: osteomyelitis   Principal Problem:   Avascular necrosis of bone (HCC) Active Problems:   ADHD (attention deficit hyperactivity disorder)   Class 3 obesity   Hypertension   Hyperlipidemia   Human immunodeficiency virus (HIV) disease (HCC)   AVN (avascular necrosis of bone) (HCC)   Hypothyroidism   GERD (gastroesophageal reflux disease)   Hepatic steatosis   Thrombocytosis   Macrocytic anemia   Chronic osteomyelitis of left tibia with draining sinus (HCC)   Cellulitis of left leg   Severe protein-calorie malnutrition (HCC)   Arthralgia of left lower leg  Interval Events: remains afebrile, no leukocytosis, hb drop from 9.3 to 8.3   Assessment Abscess/osteomyelitis of Left Proximal tibia complicated by avascular necrosis status post partial excision of left tibia on 3/25 along with placement of antibiotic beads. OR cultures growing MSSA -PICC line in place. -Tolerating antibiotics well without any issues  Recommendations Continue cefazolin as is Please refer to Dr. Feliz Beam note from 3/27 for recommendations on duration/follow-up Monitor CBC and BMP while on IV antibiotics Will follow while cultures peripherally till it finalizes otherwise, will sign off for now  Rest of the management as per the primary team. Thank you for the consult. Please page with pertinent questions or concerns.  ______________________________________________________________________ Subjective patient seen and examined at the bedside.  Patient sitting up in bed.  Denies any nausea vomiting and diarrhea.  Denies any fever and chills.  No rashes.  Some soreness in the left leg.  Vitals BP 136/77   Pulse 77   Temp (!) 97.5 F (36.4 C) (Oral)    Resp 16   Ht 5\' 4"  (1.626 m)   Wt 106.1 kg   SpO2 100%   BMI 40.17 kg/m      Physical Exam Constitutional:      Comments: Not in acute distress, obese  Cardiovascular:     Rate and Rhythm: Normal rate and regular rhythm.     Heart sounds: No murmur heard.   Pulmonary:     Effort: Pulmonary effort is normal.     Comments:   Abdominal:     Palpations: Abdomen is soft.     Tenderness: Nontender  Musculoskeletal:        General: Left leg is wrapped, has a wound VAC  Skin:    Comments: No obvious rashes  Neurological:     General: No focal deficit present.   Psychiatric:        Mood and Affect: Mood normal.   Pertinent Microbiology Results for orders placed or performed during the hospital encounter of 07/11/20  Resp Panel by RT-PCR (Flu A&B, Covid) Nasopharyngeal Swab     Status: None   Collection Time: 07/11/20  6:33 PM   Specimen: Nasopharyngeal Swab; Nasopharyngeal(NP) swabs in vial transport medium  Result Value Ref Range Status   SARS Coronavirus 2 by RT PCR NEGATIVE NEGATIVE Final    Comment: (NOTE) SARS-CoV-2 target nucleic acids are NOT DETECTED.  The SARS-CoV-2 RNA is generally detectable in upper respiratory specimens during the acute phase of infection. The lowest concentration of SARS-CoV-2 viral copies this assay can detect is 138 copies/mL. A negative result does not preclude SARS-Cov-2 infection and should not be used as the sole basis for treatment or other patient management decisions. A negative result may occur with  improper  specimen collection/handling, submission of specimen other than nasopharyngeal swab, presence of viral mutation(s) within the areas targeted by this assay, and inadequate number of viral copies(<138 copies/mL). A negative result must be combined with clinical observations, patient history, and epidemiological information. The expected result is Negative.  Fact Sheet for Patients:   BloggerCourse.com  Fact Sheet for Healthcare Providers:  SeriousBroker.it  This test is no t yet approved or cleared by the [place] FDA and  has been authorized for detection and/or diagnosis of SARS-CoV-2 by FDA under an Emergency Use Authorization (EUA). This EUA will remain  in effect (meaning this test can be used) for the duration of the COVID-19 declaration under Section 564(b)(1) of the Act, 21 U.S.C.section 360bbb-3(b)(1), unless the authorization is terminated  or revoked sooner.       Influenza A by PCR NEGATIVE NEGATIVE Final   Influenza B by PCR NEGATIVE NEGATIVE Final    Comment: (NOTE) The Xpert Xpress SARS-CoV-2/FLU/RSV plus assay is intended as an aid in the diagnosis of influenza from Nasopharyngeal swab specimens and should not be used as a sole basis for treatment. Nasal washings and aspirates are unacceptable for Xpert Xpress SARS-CoV-2/FLU/RSV testing.  Fact Sheet for Patients: BloggerCourse.com  Fact Sheet for Healthcare Providers: SeriousBroker.it  This test is not yet approved or cleared by the [place] FDA and has been authorized for detection and/or diagnosis of SARS-CoV-2 by FDA under an Emergency Use Authorization (EUA). This EUA will remain in effect (meaning this test can be used) for the duration of the COVID-19 declaration under Section 564(b)(1) of the Act, 21 U.S.C. section 360bbb-3(b)(1), unless the authorization is terminated or revoked.  Performed at [facility], [address], [place], [place] [address]   Aerobic/Anaerobic Culture (surgical/deep wound)     Status: None   Collection Time: 07/12/20  3:14 PM   Specimen: Tissue  Result Value Ref Range Status   Specimen Description   Final    TISSUE LEFT LEG Performed at [facility], [address] [place], [place] [address]    Special Requests    Final    NONE Performed at [facility], [address], [place], [place] [address]    Gram Stain NO WBC SEEN NO ORGANISMS SEEN   Final   Culture   Final    No growth aerobically or anaerobically. Performed at [facility], [address] [place], [place] [address]    Report Status 07/17/2020 FINAL  Final  Surgical pcr screen     Status: None   Collection Time: 07/15/20  3:36 AM   Specimen: Nasal Mucosa; Nasal Swab  Result Value Ref Range Status   MRSA, PCR NEGATIVE NEGATIVE Final   Staphylococcus aureus NEGATIVE NEGATIVE Final    Comment: (NOTE) The Xpert SA Assay (FDA approved for NASAL specimens in patients 58 years of age and older), is one component of a comprehensive surveillance program. It is not intended to diagnose infection nor to guide or monitor treatment. Performed at [facility], [address], [place], [place] [address]   Aerobic/Anaerobic Culture w Gram Stain (surgical/deep wound)     Status: None (Preliminary result)   Collection Time: 07/15/20 11:41 AM   Specimen: PATH Other; Tissue  Result Value Ref Range Status   Specimen Description TISSUE  Final   Special Requests LEFT WOUND TISSUE SPEC A  Final   Gram Stain   Final    ABUNDANT WBC PRESENT,BOTH PMN AND  MONONUCLEAR NO ORGANISMS SEEN Performed at [facility], [address] [place], [place] [address]    Culture   Final    FEW STAPHYLOCOCCUS AUREUS CRITICAL VALUE NOTED.  VALUE IS CONSISTENT WITH PREVIOUSLY REPORTED AND CALLED VALUE. NO ANAEROBES ISOLATED; CULTURE IN PROGRESS FOR 5 DAYS    Report Status PENDING  Incomplete   Organism ID, Bacteria STAPHYLOCOCCUS AUREUS  Final      Susceptibility   Staphylococcus aureus - MIC*    CIPROFLOXACIN <=0.5 SENSITIVE Sensitive     ERYTHROMYCIN <=0.25 SENSITIVE Sensitive     GENTAMICIN <=0.5 SENSITIVE Sensitive     OXACILLIN 0.5 SENSITIVE Sensitive     TETRACYCLINE <=1 SENSITIVE Sensitive     VANCOMYCIN <=0.5  SENSITIVE Sensitive     TRIMETH/SULFA <=10 SENSITIVE Sensitive     CLINDAMYCIN <=0.25 SENSITIVE Sensitive     RIFAMPIN <=0.5 SENSITIVE Sensitive     Inducible Clindamycin NEGATIVE Sensitive     * FEW STAPHYLOCOCCUS AUREUS  Aerobic/Anaerobic Culture w Gram Stain (surgical/deep wound)     Status: None (Preliminary result)   Collection Time: 07/15/20 11:45 AM   Specimen: Bone; Tissue  Result Value Ref Range Status   Specimen Description BONE  Final   Special Requests LEFT BONE  Final   Gram Stain   Final    ABUNDANT WBC PRESENT, PREDOMINANTLY PMN NO ORGANISMS SEEN Performed at [facility], [address] [place], [place] [address]    Culture   Final    RARE STAPHYLOCOCCUS AUREUS SUSCEPTIBILITIES TO FOLLOW CRITICAL RESULT CALLED TO, READ BACK BY AND VERIFIED WITH: RN Kerby Moors  [id number] FCP NO ANAEROBES ISOLATED; CULTURE IN PROGRESS FOR 5 DAYS    Report Status PENDING  Incomplete    Pertinent Lab. CBC Latest Ref Rng & Units 07/18/2020 07/15/2020 07/13/2020  WBC 4.0 - 10.5 K/uL 8.6 6.7 7.8  Hemoglobin 12.0 - 15.0 g/dL 8.3(L) 9.3(L) 9.1(L)  Hematocrit 36.0 - 46.0 % 26.7(L) 28.3(L) 29.7(L)  Platelets 150 - 400 K/uL 412(H) 389 405(H)   CMP Latest Ref Rng & Units 07/18/2020 07/17/2020 07/15/2020  Glucose 70 - 99 mg/dL 90 91 94  BUN 6 - 20 mg/dL <0(H) 7 <4(V)  Creatinine 0.44 - 1.00 mg/dL 4.25 9.56 3.87  Sodium 135 - 145 mmol/L 136 138 137  Potassium 3.5 - 5.1 mmol/L 4.0 3.5 3.5  Chloride 98 - 111 mmol/L 102 103 105  CO2 22 - 32 mmol/L 27 27 25   Calcium 8.9 - 10.3 mg/dL 9.2 ) 5.6(E)  Total Protein 6.5 - 8.1 g/dL - - -  Total Bilirubin 0.3 - 1.2 mg/dL - - -  Alkaline Phos 38 - 126 U/L - - -  AST 15 - 41 U/L - - -  ALT 0 - 44 U/L - - -     Pertinent Imaging today Plain films and CT images have been personally visualized and interpreted; radiology reports have been reviewed. Decision making incorporated into the Impression / Recommendations.  I have spent approx 30  minutes for this patient encounter including review of prior medical records, coordination of care  with greater than 50% of time being face to face/counseling and discussing diagnostics/treatment plan with the patient/family.  Electronically signed by:   3.3(I, MD Infectious Disease Physician [facility] for Infectious Disease Pager: [phone]

## 2020-07-19 NOTE — Discharge Summary (Signed)
Physician Discharge Summary  Lindsay French UEK:[id number] DOB: Jan 17, 1981 DOA: 07/11/2020  PCP: Ephriam Jenkins, PA  Admit date: 07/11/2020 Discharge date: 07/18/2020  Admitted From: Home Disposition:  Home.   Recommendations for Outpatient Follow-up:  1. Follow up with PCP in 1-2 weeks 2. Please obtain BMP/CBC in one week Please follow up with ID as recommended.   Discharge Condition:stable. CODE STATUS:FULL CODE.  Diet recommendation: Heart Healthy    Brief/Interim Summary: 40 year old lady prior history of ADHD, anxiety, depression, HIV on antiretroviral therapy, hypertension, hyperlipidemia, history of AVN, presents with left pretibial edema erythema and worsening pain in the leg.  MRI of the leg showed complex fluid collection with marrow edema suspicious for acute on chronic AVN with infectious complex.  Orthopedics consulted and she was transferred from [place] to [facility] for partial excision of the left hip by Dr. Sharol Given. She underwent debridement of abscess and OM of the left tibia. She is put on wound VAC cannister. Plan for discharge home on IV antibiotics and wound VAC when insurance approves.   Discharge Diagnoses:  Principal Problem:   Avascular necrosis of bone (HCC) Active Problems:   ADHD (attention deficit hyperactivity disorder)   Class 3 obesity   Hypertension   Hyperlipidemia   Human immunodeficiency virus (HIV) disease (HCC)   AVN (avascular necrosis of bone) (HCC)   Hypothyroidism   GERD (gastroesophageal reflux disease)   Hepatic steatosis   Thrombocytosis   Macrocytic anemia   Chronic osteomyelitis of left tibia with draining sinus (HCC)   Cellulitis of left leg   Severe protein-calorie malnutrition (HCC)   Arthralgia of left lower leg   Acute on chronic AVN with infectious complex MRI of the left lower extremity showed avascular necrosis of the distal femoral metaphysis with surrounding edema and mild periostitis.  Orthopedics consulted and she is  transferred to [facility] for excisional debridement of the proximal tibia and placement of antibiotic beads. She underwent debridement of abscess and OM of the left tibia. She is put on wound VAC cannister. Plan for discharge home on IV antibiotics and wound VAC when insurance approves.  Cultures growing rate staph, sensitivities reviewed .  pt remain afebrile, no leukocytosis.      HIV Continue with antiretroviral therapy ID on board.    GERD:  Resume home meds.    Hypertension;  Well controlled bp parameters.    Hypothyroidism;  Resume synthroid.    ADHD;  Continue with adderall 20 mg daily.    Anxiety ;  Resume xanax 0.5 mg BID prn.   Hypokalemia  replaced. Repeat level wnl.     Macrocytic anemia:  Folate at 6.5  vitamin b12 is 282.  Hemoglobin stable around 9.     Body mass index is 40.17 kg/m. Obesity:  Recommend outpatient follow up with PCP.    Constipation;  Resolved.     Discharge Instructions  Discharge Instructions    [facility] pharmacist to adjust dose for Vancomycin, Aminoglycosides and other anti-infective therapies as requested by physician.   Complete by: As directed    [facility] to provide Cath Flo 63m   Complete by: As directed    Administer for PICC line occlusion and as ordered by physician for other access device issues.   Anaphylaxis Kit: Provided to treat any anaphylactic reaction to the medication being provided to the patient if First Dose or when requested by physician   Complete by: As directed    Epinephrine 136mml vial /  amp: Administer 0.28m (0.312m subcutaneously once for moderate to severe anaphylaxis, nurse to call physician and pharmacy when reaction occurs and call 911 if needed for immediate care   Diphenhydramine 5040ml IV vial: Administer 25-29m75m/IM PRN for first dose reaction, rash, itching, mild reaction, nurse to call physician and pharmacy when reaction  occurs   Sodium Chloride 0.9% NS 500ml60m Administer if needed for hypovolemic blood pressure drop or as ordered by physician after call to physician with anaphylactic reaction   Change dressing on IV access line weekly and PRN   Complete by: As directed    Diet - low sodium heart healthy   Complete by: As directed    Flush IV access with Sodium Chloride 0.9% and Heparin 10 units/ml or 100 units/ml   Complete by: As directed    Home infusion instructions - [facility]   Complete by: As directed    Instructions: Flush IV access with Sodium Chloride 0.9% and Heparin 10units/ml or 100units/ml   Change dressing on IV access line: Weekly and PRN   Instructions Cath Flo 2mg: 38minister for PICC Line occlusion and as ordered by physician for other access device   [facility] pharmacist to adjust dose for: Vancomycin, Aminoglycosides and other anti-infective therapies as requested by physician   Increase activity slowly   Complete by: As directed    Method of administration may be changed at the discretion of home infusion pharmacist based upon assessment of the patient and/or caregiver's ability to self-administer the medication ordered   Complete by: As directed    Negative Pressure Wound Therapy - Incisional   Complete by: As directed    Discharge with the Praveena plus portable wound VAC pump.  Show patient how to plug this in.   No wound care   Complete by: As directed      Allergies as of 07/18/2020      Reactions   Guaifenesin Nausea And Vomiting   Hydrocodone Itching   hot   Lactose Intolerance (gi) Other (See Comments)   Upset stomach   Latex Other (See Comments)   As a teenager when using latex condoms she would get a yeast infection.       Medication List    STOP taking these medications   celecoxib 200 MG capsule Commonly known as: CeleBREX   cephALEXin 500 MG capsule Commonly known as: Keflex   morphine 15 MG 12 hr tablet Commonly known as: MS  CONTIN     TAKE these medications   (feeding supplement) PROSource Plus liquid Take 30 mLs by mouth 2 (two) times daily with a meal.   albuterol 108 (90 Base) MCG/ACT inhaler Commonly known as: VENTOLIN HFA Inhale 1 puff into the lungs every 6 (six) hours as needed for wheezing or shortness of breath.   ALPRAZolam 0.5 MG tablet Commonly known as: XANAX Take 0.5 mg by mouth 2 (two) times daily as needed for anxiety.   amphetamine-dextroamphetamine 20 MG 24 hr capsule Commonly known as: ADDERALL XR Take 20 mg by mouth every morning.   busPIRone 15 MG tablet Commonly known as: BUSPAR Take 15 mg by mouth 2 (two) times daily.   ceFAZolin  IVPB Commonly known as: ANCEF Inject 2 g into the vein every 8 (eight) hours. Indication:  Osteomyelitis First Dose: No Last Day of Therapy:  08/27/20 Labs - Once weekly:  CBC/D and BMP, Labs - Every other week:  ESR and CRP Method of administration: IV Push Method of administration  may be changed at the discretion of home infusion pharmacist based upon assessment of the patient and/or caregiver's ability to self-administer the medication ordered.   docusate sodium 100 MG capsule Commonly known as: COLACE Take 100 mg by mouth daily.   [name] 50-300 MG Tabs Generic drug: Dolutegravir-lamiVUDine TAKE 1 TABLET BY MOUTH DAILY   gabapentin 100 MG capsule Commonly known as: NEURONTIN Take 300 mg by mouth at bedtime.   irbesartan 150 MG tablet Commonly known as: AVAPRO Take 150 mg by mouth every evening.   ketorolac 10 MG tablet Commonly known as: TORADOL Take 1 tablet (10 mg total) by mouth every 8 (eight) hours as needed for moderate pain.   melatonin 5 MG Tabs Take 15 mg by mouth at bedtime.   MELATONIN PO Take 1 tablet by mouth at bedtime as needed (sleep).   methocarbamol 500 MG tablet Commonly known as: ROBAXIN Take 1 tablet (500 mg total) by mouth every 6 (six) hours as needed for muscle spasms.   OVER THE COUNTER  MEDICATION Take 400 mcg by mouth daily. Methyl Folate 400 mcg   OVER THE COUNTER MEDICATION Take 50 mg by mouth daily. P-5-P   Oxycodone HCl 10 MG Tabs Take 1-1.5 tablets (10-15 mg total) by mouth every 4 (four) hours as needed for up to 5 days for severe pain (pain score 7-10). What changed:   medication strength  how much to take  when to take this  reasons to take this   polyethylene glycol 17 g packet Commonly known as: MIRALAX / GLYCOLAX Take 17 g by mouth daily.   venlafaxine XR 75 MG 24 hr capsule Commonly known as: EFFEXOR-XR Take 225 mg by mouth every evening.   VITAMIN B 12 PO Take 1,000 mcg by mouth daily.   vitamin C 1000 MG tablet Take 1,000 mg by mouth daily.            Discharge Care Instructions  (From admission, onward)         Start     Ordered   07/18/20 0000  Change dressing on IV access line weekly and PRN  (Home infusion instructions - [facility] )        07/18/20 1226          Follow-up Information    Newt Minion, MD In 1 week.   Specialty: Orthopedic Surgery Contact information: [address] [place] [address] [phone]        Ephriam Jenkins, PA. Schedule an appointment as soon as possible for a visit in 1 week(s).   Specialty: Physician Assistant Contact information: [place] [address] [place], [place] Follow up.   Specialty: Home Health Services Contact information: [address] [place] [address] [phone]              Allergies  Allergen Reactions  . Guaifenesin Nausea And Vomiting  . Hydrocodone Itching    hot  . Lactose Intolerance (Gi) Other (See Comments)    Upset stomach  . Latex Other (See Comments)    As a teenager when using latex condoms she would get a yeast infection.     Consultations:  ID  Orthopedics.    Procedures/Studies: DG Tibia/Fibula Left  Result Date: 07/18/2020 CLINICAL DATA:   Pain. Felt a pop in left leg while walking to the bathroom yesterday. EXAM: LEFT TIBIA AND FIBULA - 2 VIEW COMPARISON:  MRI 07/08/2020 FINDINGS: Bone packing/antibiotic beads within the proximal tibial metaphysis. Associated periosteal thickening as seen on MR I. There is no evidence of acute fracture. Overlying wound VAC in place. There is generalized soft tissue edema. IMPRESSION: Postsurgical change with bone packing/antibiotic beads within the proximal tibial metaphysis at site of prior bone infarct. No evidence of acute fracture. Electronically Signed   By: Keith Rake M.D.   On: 07/18/2020 15:22   DG Ankle Complete Left  Result Date: 07/18/2020 CLINICAL DATA:  Ankle pain. Felt a pop in left leg while walking to the bathroom yesterday. EXAM: LEFT ANKLE COMPLETE - 3+ VIEW COMPARISON:  None. FINDINGS: There is no evidence of fracture, dislocation, or joint effusion. No visualized bone infarct or evidence of a vascular necrosis. There is no evidence of arthropathy or other focal bone abnormality. Generalized soft tissue edema. IMPRESSION: Generalized soft tissue edema. No osseous abnormality. Electronically Signed   By: Keith Rake M.D.   On: 07/18/2020 15:23   MR TIBIA FIBULA LEFT W WO CONTRAST  Result Date: 07/08/2020 CLINICAL DATA:  Avascular necrosis, ITP.  Pain and erythema. EXAM: MRI OF LOWER LEFT EXTREMITY WITHOUT AND WITH CONTRAST TECHNIQUE: Multiplanar, multisequence MR imaging of the left tibia/fibula was performed both before and after administration of intravenous contrast. CONTRAST:  24m GADAVIST GADOBUTROL 1 MMOL/ML IV SOLN COMPARISON:  None. FINDINGS: Bones/Joint/Cartilage Chronic appearing avascular necrosis in the right proximal tibial metaphysis. On the left side, there is a larger (11.4 by 3.6 by 3.1 cm) region of involvement in the proximal metaphysis with surrounding marrow edema, accentuated enhancement, periostitis, and abnormal edema and enhancement in the surrounding  soft tissues including the muscular tissues. This is somewhat similar to the process on the contralateral side, with apparent potentially reflecting acute on chronic infarct or superinfection along prior AVN. Overall this is a complex appearance and accordingly difficult to classify, although the presence of high T1 signal within the main lesion tends to suggest some revascularization of chronic AVN. There is some serpentine scalloped appearance of the lower margins with associated endosteal narrowing for example on image 27 of series 14, with overlying cortical thickening and potentially a small fluid collection, which is an unusual appearance making it difficult to exclude the possibility of superimposed infection. Ligaments N/A Muscles and Tendons Abnormal edema and enhancement in the musculature adjacent to the proximal tibia including the tibialis anterior, proximal peroneus longus, and popliteus muscles. Soft tissues Small complex collection in the deep subcutaneous tissues adjacent to a region of cortical thinning, questionable communication with the bone, measuring about 3.0 by 1.0 by 3.3 cm. This does have some high internal T1 signal for example on image 23 of series 14 and accordingly could represent hematoma rather than infection. IMPRESSION: 1. Complex appearance with suspected bilateral proximal tibial metaphysis avascular necrosis, but with extensive left-sided proximal tibia marrow edema, endosteal scalloping, possible subtle cortical breakthrough, and complex adjacent fluid along the proximal tibial lesion raising a greater degree of concern for infection. Acute on chronic infarct with periosteal hematoma is a differential diagnostic consideration. Correlate with fever/leukocytosis. Electronically Signed   By: WVan ClinesM.D.   On: 07/08/2020 17:32   MR FEMUR LEFT W WO CONTRAST  Result Date: 07/08/2020 CLINICAL DATA:  Upper leg pain and tenderness EXAM: MR OF THE LEFT LOWER EXTREMITY  WITHOUT AND WITH CONTRAST TECHNIQUE: Multiplanar, multisequence MR imaging of the left femur was performed both before and after administration of intravenous contrast. CONTRAST:  14mGADAVIST GADOBUTROL 1 MMOL/ML  IV SOLN COMPARISON:  None. FINDINGS: Bones/Joint/Cartilage Low T1 signal in the shaft of the femur possibly reflecting reactive marrow findings from the patient's high TP. 8.5 by 4.5 by 2.0 cm region of avascular necrosis in the distal femoral metaphysis with surrounding edema and potentially some mild periostitis suggesting a potential acute component. Given the edema and surrounding periostitis as well as the surrounding soft tissue edema around the distal femur, the possibility of chronic osteomyelitis is also raised, but is considered less likely given the central high T1 signal in this process which probably represents revascularization in some fatty marrow, which would be unusual in the setting of pure osteomyelitis. There is an adjacent small knee joint effusion but without thick enhancing synovium to suggest a septic joint. Another factor in favor of avascular necrosis is the presence of other findings of AVN in the proximal tibia bilaterally on additional tibial imaging performed under separate accession. Ligaments N/A Muscles and Tendons Low-grade edema tracks along the distal margins of the vastus medialis and vastus lateralis muscles, and remains nonspecific. Soft tissues Mild subcutaneous edema primarily anteriorly along the thigh region. IMPRESSION: 1. 8.5 by 4.5 by 2.0 cm region of avascular necrosis in the distal femoral metaphysis with surrounding edema and potentially some mild periostitis suggesting a potential acute infarct component. Given the edema and surrounding soft tissue edema around the distal femur, the possibility of chronic osteomyelitis is also raised, but is considered less likely given the central high T1 signal characteristic of revascularization and fatty marrow, which  would be unusual in the setting of pure osteomyelitis. 2. Small knee joint effusion but without thick enhancing synovium to suggest a septic joint. 3. Low-grade edema tracks along the distal margins of the vastus medialis and vastus lateralis muscles, and remains nonspecific. 4. Mild subcutaneous edema anteriorly along the thigh region. Electronically Signed   By: Van Clines M.D.   On: 07/08/2020 17:24   [place] CORE BIOPSY (SOFT TISSUE)  Result Date: 07/12/2020 INDICATION: 40 year old woman with findings suspicious for left tibia osteomyelitis with overlying myositis presents over short Ya logistic for aspiration/biopsy. EXAM: Ultrasound-guided biopsy of inflamed tissues anterior to left tibia. MEDICATIONS: None. ANESTHESIA/SEDATION: Moderate (conscious) sedation was employed during this procedure. A total of Versed 2 mg and Fentanyl 100 mcg was administered intravenously. Moderate Sedation Time: 10 minutes. [name] level of consciousness and vital signs were monitored continuously by radiology nursing throughout the procedure under my direct supervision. COMPLICATIONS: None immediate. PROCEDURE: Informed written consent was obtained from the patient after a thorough discussion of the procedural risks, benefits and alternatives. All questions were addressed. Maximal Sterile Barrier Technique was utilized including caps, mask, sterile gowns, sterile gloves, sterile drape, hand hygiene and skin antiseptic. A timeout was performed prior to the initiation of the procedure. Patient position supine on the ultrasound table. Right anterior lower leg skin prepped and draped in usual sterile fashion. Following local lidocaine administration, 17 gauge introducer needle was advanced into the abnormal tissues anterior to the proximal tibia. No fluid could be aspirated.Three 18 gauge cores were obtained utilizing continuous ultrasound guidance. Samples were sent to pathology in sterile saline. Needle removed and  hemostasis achieved with 2 minutes of manual compression. Post procedure ultrasound images showed no evidence of significant hemorrhage. IMPRESSION: Ultrasound-guided biopsy of inflamed soft tissues anterior to abnormal appearing tibia (suspicious for osteomyelitis). Three cores were sent in sterile saline for Gram stain and culture. Electronically Signed   By: Miachel Roux M.D.   On: 07/12/2020 16:53  [place] EKG SITE RITE  Result Date: 07/14/2020 If Site Rite image not attached, placement could not be confirmed due to current cardiac rhythm.      Subjective: NO NEW complaints.   Discharge Exam: Vitals:   07/18/20 0912 07/18/20 1634  BP: 136/77 130/83  Pulse: 77 83  Resp: 16 16  Temp: (!) 97.5 F (36.4 C) 98.4 F (36.9 C)  SpO2: 100% 99%   Vitals:   07/17/20 1949 07/18/20 0420 07/18/20 0912 07/18/20 1634  BP: 129/81 (!) 137/97 136/77 130/83  Pulse: 82 86 77 83  Resp: '17 18 16 16  ' Temp: 98.5 F (36.9 C) 98.8 F (37.1 C) (!) 97.5 F (36.4 C) 98.4 F (36.9 C)  TempSrc: Oral Oral Oral Oral  SpO2: 100% 100% 100% 99%  Weight:      Height:        General: Pt is alert, awake, not in acute distress Cardiovascular: RRR, S1/S2 +, no rubs, no gallops Respiratory: CTA bilaterally, no wheezing, no rhonchi Abdominal: Soft, NT, ND, bowel sounds + Extremities: no edema, no cyanosis    The results of significant diagnostics from this hospitalization (including imaging, microbiology, ancillary and laboratory) are listed below for reference.     Microbiology: Recent Results (from the past 240 hour(s))  Resp Panel by RT-PCR (Flu A&B, Covid) Nasopharyngeal Swab     Status: None   Collection Time: 07/11/20  6:33 PM   Specimen: Nasopharyngeal Swab; Nasopharyngeal(NP) swabs in vial transport medium  Result Value Ref Range Status   SARS Coronavirus 2 by RT PCR NEGATIVE NEGATIVE Final    Comment: (NOTE) SARS-CoV-2 target nucleic acids are NOT DETECTED.  The SARS-CoV-2 RNA is generally  detectable in upper respiratory specimens during the acute phase of infection. The lowest concentration of SARS-CoV-2 viral copies this assay can detect is 138 copies/mL. A negative result does not preclude SARS-Cov-2 infection and should not be used as the sole basis for treatment or other patient management decisions. A negative result may occur with  improper specimen collection/handling, submission of specimen other than nasopharyngeal swab, presence of viral mutation(s) within the areas targeted by this assay, and inadequate number of viral copies(<138 copies/mL). A negative result must be combined with clinical observations, patient history, and epidemiological information. The expected result is Negative.  Fact Sheet for Patients:  EntrepreneurPulse.com.au  Fact Sheet for Healthcare Providers:  IncredibleEmployment.be  This test is no t yet approved or cleared by the [place] FDA and  has been authorized for detection and/or diagnosis of SARS-CoV-2 by FDA under an Emergency Use Authorization (EUA). This EUA will remain  in effect (meaning this test can be used) for the duration of the COVID-19 declaration under Section 564(b)(1) of the Act, 21 U.S.C.section 360bbb-3(b)(1), unless the authorization is terminated  or revoked sooner.       Influenza A by PCR NEGATIVE NEGATIVE Final   Influenza B by PCR NEGATIVE NEGATIVE Final    Comment: (NOTE) The Xpert Xpress SARS-CoV-2/FLU/RSV plus assay is intended as an aid in the diagnosis of influenza from Nasopharyngeal swab specimens and should not be used as a sole basis for treatment. Nasal washings and aspirates are unacceptable for Xpert Xpress SARS-CoV-2/FLU/RSV testing.  Fact Sheet for Patients: EntrepreneurPulse.com.au  Fact Sheet for Healthcare Providers: IncredibleEmployment.be  This test is not yet approved or cleared by the [place] FDA  and has been authorized for detection and/or diagnosis of SARS-CoV-2 by FDA under an Emergency Use Authorization (EUA). This EUA will remain in  effect (meaning this test can be used) for the duration of the COVID-19 declaration under Section 564(b)(1) of the Act, 21 U.S.C. section 360bbb-3(b)(1), unless the authorization is terminated or revoked.  Performed at [facility], [place] [address] [place], [place] [address]   Aerobic/Anaerobic Culture (surgical/deep wound)     Status: None   Collection Time: 07/12/20  3:14 PM   Specimen: Tissue  Result Value Ref Range Status   Specimen Description   Final    TISSUE LEFT LEG Performed at [facility], [place] [address], [place], [place] [address]    Special Requests   Final    NONE Performed at [facility], [place] [address], [place], [place] [address]    Gram Stain NO WBC SEEN NO ORGANISMS SEEN   Final   Culture   Final    No growth aerobically or anaerobically. Performed at [facility], [place] [address], [place], [place] [address]    Report Status 07/17/2020 FINAL  Final  Surgical pcr screen     Status: None   Collection Time: 07/15/20  3:36 AM   Specimen: Nasal Mucosa; Nasal Swab  Result Value Ref Range Status   MRSA, PCR NEGATIVE NEGATIVE Final   Staphylococcus aureus NEGATIVE NEGATIVE Final    Comment: (NOTE) The Xpert SA Assay (FDA approved for NASAL specimens in patients 24 years of age and older), is one component of a comprehensive surveillance program. It is not intended to diagnose infection nor to guide or monitor treatment. Performed at [facility], [place] [address] [place], [place] [address]   Aerobic/Anaerobic Culture w Gram Stain (surgical/deep wound)     Status: None (Preliminary result)   Collection Time: 07/15/20 11:41 AM   Specimen: PATH Other; Tissue  Result Value Ref Range Status   Specimen Description TISSUE  Final   Special Requests LEFT WOUND TISSUE  SPEC A  Final   Gram Stain   Final    ABUNDANT WBC PRESENT,BOTH PMN AND MONONUCLEAR NO ORGANISMS SEEN Performed at [facility], [place] [address], [place], [place] [address]    Culture   Final    FEW STAPHYLOCOCCUS AUREUS CRITICAL VALUE NOTED.  VALUE IS CONSISTENT WITH PREVIOUSLY REPORTED AND CALLED VALUE. NO ANAEROBES ISOLATED; CULTURE IN PROGRESS FOR 5 DAYS    Report Status PENDING  Incomplete   Organism ID, Bacteria STAPHYLOCOCCUS AUREUS  Final      Susceptibility   Staphylococcus aureus - MIC*    CIPROFLOXACIN <=0.5 SENSITIVE Sensitive     ERYTHROMYCIN <=0.25 SENSITIVE Sensitive     GENTAMICIN <=0.5 SENSITIVE Sensitive     OXACILLIN 0.5 SENSITIVE Sensitive     TETRACYCLINE <=1 SENSITIVE Sensitive     VANCOMYCIN <=0.5 SENSITIVE Sensitive     TRIMETH/SULFA <=10 SENSITIVE Sensitive     CLINDAMYCIN <=0.25 SENSITIVE Sensitive     RIFAMPIN <=0.5 SENSITIVE Sensitive     Inducible Clindamycin NEGATIVE Sensitive     * FEW STAPHYLOCOCCUS AUREUS  Aerobic/Anaerobic Culture w Gram Stain (surgical/deep wound)     Status: None (Preliminary result)   Collection Time: 07/15/20 11:45 AM   Specimen: Bone; Tissue  Result Value Ref Range Status   Specimen Description BONE  Final   Special Requests LEFT BONE  Final   Gram Stain   Final    ABUNDANT WBC PRESENT, PREDOMINANTLY PMN NO ORGANISMS SEEN Performed at [facility], [address] [place], [place] [address]    Culture  Final    RARE STAPHYLOCOCCUS AUREUS CRITICAL RESULT CALLED TO, READ BACK BY AND VERIFIED WITH: RN Nat Math  [phone] FCP NO ANAEROBES ISOLATED; CULTURE IN PROGRESS FOR 5 DAYS    Report Status PENDING  Incomplete   Organism ID, Bacteria STAPHYLOCOCCUS AUREUS  Final      Susceptibility   Staphylococcus aureus - MIC*    CIPROFLOXACIN <=0.5 SENSITIVE Sensitive     ERYTHROMYCIN <=0.25 SENSITIVE Sensitive     GENTAMICIN <=0.5 SENSITIVE Sensitive     OXACILLIN 0.5 SENSITIVE Sensitive     TETRACYCLINE <=1  SENSITIVE Sensitive     VANCOMYCIN 1 SENSITIVE Sensitive     TRIMETH/SULFA <=10 SENSITIVE Sensitive     CLINDAMYCIN <=0.25 SENSITIVE Sensitive     RIFAMPIN <=0.5 SENSITIVE Sensitive     Inducible Clindamycin NEGATIVE Sensitive     * RARE STAPHYLOCOCCUS AUREUS     Labs: BNP (last 3 results) No results for input(s): BNP in the last 8760 hours. Basic Metabolic Panel: Recent Labs  Lab 07/13/20 0330 07/15/20 0901 07/17/20 0500 07/18/20 0850  NA 137 137 138 136  K 3.4* 3.5 3.5 4.0  CL 105 105 103 102  CO2 '25 25 27 27  ' GLUCOSE 94 94 91 90  BUN 6 <5* 7 <5*  CREATININE 0.46 0.49 0.59 0.62  CALCIUM 8.6* 8.6* 8.7* 9.2   Liver Function Tests: Recent Labs  Lab 07/13/20 0330  AST 19  ALT 17  ALKPHOS 88  BILITOT 0.6  PROT 6.0*  ALBUMIN 2.5*   No results for input(s): LIPASE, AMYLASE in the last 168 hours. No results for input(s): AMMONIA in the last 168 hours. CBC: Recent Labs  Lab 07/13/20 0330 07/15/20 0901 07/18/20 0850  WBC 7.8 6.7 8.6  NEUTROABS  --  4.7  --   HGB 9.1* 9.3* 8.3*  HCT 29.7* 28.3* 26.7*  MCV 111.7* 106.8* 107.2*  PLT 405* 389 412*   Cardiac Enzymes: Recent Labs  Lab 07/15/20 0901  CKTOTAL 24*   BNP: Invalid input(s): POCBNP CBG: No results for input(s): GLUCAP in the last 168 hours. D-Dimer No results for input(s): DDIMER in the last 72 hours. Hgb A1c No results for input(s): HGBA1C in the last 72 hours. Lipid Profile No results for input(s): CHOL, HDL, LDLCALC, TRIG, CHOLHDL, LDLDIRECT in the last 72 hours. Thyroid function studies No results for input(s): TSH, T4TOTAL, T3FREE, THYROIDAB in the last 72 hours.  Invalid input(s): FREET3 Anemia work up No results for input(s): VITAMINB12, FOLATE, FERRITIN, TIBC, IRON, RETICCTPCT in the last 72 hours. Urinalysis    Component Value Date/Time   COLORURINE STRAW (A) 07/13/2020 1255   APPEARANCEUR CLEAR 07/13/2020 1255   LABSPEC 1.006 07/13/2020 1255   PHURINE 8.0 07/13/2020 1255    GLUCOSEU NEGATIVE 07/13/2020 1255   HGBUR NEGATIVE 07/13/2020 1255   BILIRUBINUR NEGATIVE 07/13/2020 [place] 07/13/2020 1255   PROTEINUR NEGATIVE 07/13/2020 1255   NITRITE NEGATIVE 07/13/2020 1255   LEUKOCYTESUR NEGATIVE 07/13/2020 1255   Sepsis Labs Invalid input(s): PROCALCITONIN,  WBC,  LACTICIDVEN Microbiology Recent Results (from the past 240 hour(s))  Resp Panel by RT-PCR (Flu A&B, Covid) Nasopharyngeal Swab     Status: None   Collection Time: 07/11/20  6:33 PM   Specimen: Nasopharyngeal Swab; Nasopharyngeal(NP) swabs in vial transport medium  Result Value Ref Range Status   SARS Coronavirus 2 by RT PCR NEGATIVE NEGATIVE Final    Comment: (NOTE) SARS-CoV-2 target nucleic acids are NOT DETECTED.  The SARS-CoV-2 RNA is  generally detectable in upper respiratory specimens during the acute phase of infection. The lowest concentration of SARS-CoV-2 viral copies this assay can detect is 138 copies/mL. A negative result does not preclude SARS-Cov-2 infection and should not be used as the sole basis for treatment or other patient management decisions. A negative result may occur with  improper specimen collection/handling, submission of specimen other than nasopharyngeal swab, presence of viral mutation(s) within the areas targeted by this assay, and inadequate number of viral copies(<138 copies/mL). A negative result must be combined with clinical observations, patient history, and epidemiological information. The expected result is Negative.  Fact Sheet for Patients:  EntrepreneurPulse.com.au  Fact Sheet for Healthcare Providers:  IncredibleEmployment.be  This test is no t yet approved or cleared by the [place] FDA and  has been authorized for detection and/or diagnosis of SARS-CoV-2 by FDA under an Emergency Use Authorization (EUA). This EUA will remain  in effect (meaning this test can be used) for the duration of  the COVID-19 declaration under Section 564(b)(1) of the Act, 21 U.S.C.section 360bbb-3(b)(1), unless the authorization is terminated  or revoked sooner.       Influenza A by PCR NEGATIVE NEGATIVE Final   Influenza B by PCR NEGATIVE NEGATIVE Final    Comment: (NOTE) The Xpert Xpress SARS-CoV-2/FLU/RSV plus assay is intended as an aid in the diagnosis of influenza from Nasopharyngeal swab specimens and should not be used as a sole basis for treatment. Nasal washings and aspirates are unacceptable for Xpert Xpress SARS-CoV-2/FLU/RSV testing.  Fact Sheet for Patients: EntrepreneurPulse.com.au  Fact Sheet for Healthcare Providers: IncredibleEmployment.be  This test is not yet approved or cleared by the [place] FDA and has been authorized for detection and/or diagnosis of SARS-CoV-2 by FDA under an Emergency Use Authorization (EUA). This EUA will remain in effect (meaning this test can be used) for the duration of the COVID-19 declaration under Section 564(b)(1) of the Act, 21 U.S.C. section 360bbb-3(b)(1), unless the authorization is terminated or revoked.  Performed at [facility], [place] [address] [place], [place] [address]   Aerobic/Anaerobic Culture (surgical/deep wound)     Status: None   Collection Time: 07/12/20  3:14 PM   Specimen: Tissue  Result Value Ref Range Status   Specimen Description   Final    TISSUE LEFT LEG Performed at [facility], [place] [address] [place], [place] [address]    Special Requests   Final    NONE Performed at [facility], [place] [address] [place], [place] [address]    Gram Stain NO WBC SEEN NO ORGANISMS SEEN   Final   Culture   Final    No growth aerobically or anaerobically. Performed at [facility], [place] [address] [place], [place] [address]    Report Status 07/17/2020 FINAL  Final  Surgical pcr screen     Status: None   Collection Time:  07/15/20  3:36 AM   Specimen: Nasal Mucosa; Nasal Swab  Result Value Ref Range Status   MRSA, PCR NEGATIVE NEGATIVE Final   Staphylococcus aureus NEGATIVE NEGATIVE Final    Comment: (NOTE) The Xpert SA Assay (FDA approved for NASAL specimens in patients 92 years of age and older), is one component of a comprehensive surveillance program. It is not intended to diagnose infection nor to guide or monitor treatment. Performed at [facility], [place] [address] [place], [place] [address]   Aerobic/Anaerobic Culture w Gram Stain (surgical/deep wound)  Status: None (Preliminary result)   Collection Time: 07/15/20 11:41 AM   Specimen: PATH Other; Tissue  Result Value Ref Range Status   Specimen Description TISSUE  Final   Special Requests LEFT WOUND TISSUE SPEC A  Final   Gram Stain   Final    ABUNDANT WBC PRESENT,BOTH PMN AND MONONUCLEAR NO ORGANISMS SEEN Performed at [facility], [address], [place], [place] [address]    Culture   Final    FEW STAPHYLOCOCCUS AUREUS CRITICAL VALUE NOTED.  VALUE IS CONSISTENT WITH PREVIOUSLY REPORTED AND CALLED VALUE. NO ANAEROBES ISOLATED; CULTURE IN PROGRESS FOR 5 DAYS    Report Status PENDING  Incomplete   Organism ID, Bacteria STAPHYLOCOCCUS AUREUS  Final      Susceptibility   Staphylococcus aureus - MIC*    CIPROFLOXACIN <=0.5 SENSITIVE Sensitive     ERYTHROMYCIN <=0.25 SENSITIVE Sensitive     GENTAMICIN <=0.5 SENSITIVE Sensitive     OXACILLIN 0.5 SENSITIVE Sensitive     TETRACYCLINE <=1 SENSITIVE Sensitive     VANCOMYCIN <=0.5 SENSITIVE Sensitive     TRIMETH/SULFA <=10 SENSITIVE Sensitive     CLINDAMYCIN <=0.25 SENSITIVE Sensitive     RIFAMPIN <=0.5 SENSITIVE Sensitive     Inducible Clindamycin NEGATIVE Sensitive     * FEW STAPHYLOCOCCUS AUREUS  Aerobic/Anaerobic Culture w Gram Stain (surgical/deep wound)     Status: None (Preliminary result)   Collection Time: 07/15/20 11:45 AM   Specimen: Bone; Tissue  Result Value Ref  Range Status   Specimen Description BONE  Final   Special Requests LEFT BONE  Final   Gram Stain   Final    ABUNDANT WBC PRESENT, PREDOMINANTLY PMN NO ORGANISMS SEEN Performed at [facility], [address], [place], [place] [address]    Culture   Final    RARE STAPHYLOCOCCUS AUREUS CRITICAL RESULT CALLED TO, READ BACK BY AND VERIFIED WITH: RN Nat Math  [phone] FCP NO ANAEROBES ISOLATED; CULTURE IN PROGRESS FOR 5 DAYS    Report Status PENDING  Incomplete   Organism ID, Bacteria STAPHYLOCOCCUS AUREUS  Final      Susceptibility   Staphylococcus aureus - MIC*    CIPROFLOXACIN <=0.5 SENSITIVE Sensitive     ERYTHROMYCIN <=0.25 SENSITIVE Sensitive     GENTAMICIN <=0.5 SENSITIVE Sensitive     OXACILLIN 0.5 SENSITIVE Sensitive     TETRACYCLINE <=1 SENSITIVE Sensitive     VANCOMYCIN 1 SENSITIVE Sensitive     TRIMETH/SULFA <=10 SENSITIVE Sensitive     CLINDAMYCIN <=0.25 SENSITIVE Sensitive     RIFAMPIN <=0.5 SENSITIVE Sensitive     Inducible Clindamycin NEGATIVE Sensitive     * RARE STAPHYLOCOCCUS AUREUS     Time coordinating discharge: 35 MINUTES  SIGNED:   Hosie Poisson, MD  [facility] 07/19/2020, 6:01 PM

## 2020-07-20 ENCOUNTER — Ambulatory Visit (INDEPENDENT_AMBULATORY_CARE_PROVIDER_SITE_OTHER): Payer: 59 | Admitting: Family

## 2020-07-20 ENCOUNTER — Encounter: Payer: Self-pay | Admitting: Family

## 2020-07-20 DIAGNOSIS — M86462 Chronic osteomyelitis with draining sinus, left tibia and fibula: Secondary | ICD-10-CM

## 2020-07-20 LAB — AEROBIC/ANAEROBIC CULTURE W GRAM STAIN (SURGICAL/DEEP WOUND)

## 2020-07-20 NOTE — Progress Notes (Signed)
Post-Op Visit Note   Patient: Lindsay French           Date of Birth: 1981-02-28           MRN: [id number] Visit Date: 07/20/2020 PCP: Adrienne Mocha, PA  Chief Complaint: No chief complaint on file.   HPI:  HPI The patient is a 40 year old woman seen status post partial excision of her left tibia with antibiotic bead placement on March 25.  She states that her wound VAC began alarming yesterday for an occlusion in the line she did turn it off last night.  Ortho Exam Wound VAC dressing removed today.  Dry dressing applied  On examination of the left leg the incision is well approximated with sutures there is no gaping no drainage no surrounding erythema or signs of infection  Visit Diagnoses: No diagnosis found.  Plan: Begin daily Dial soap cleansing.  Dry dressing changes.  She will follow-up in the office in 1 week for incisional check.  Follow-Up Instructions: No follow-ups on file.   Imaging: No results found.  Orders:  No orders of the defined types were placed in this encounter.  No orders of the defined types were placed in this encounter.    PMFS History: Patient Active Problem List   Diagnosis Date Noted  . Arthralgia of left lower leg   . Chronic osteomyelitis of left tibia with draining sinus (HCC)   . Cellulitis of left leg   . Severe protein-calorie malnutrition (HCC)   . Avascular necrosis of bone (HCC) 07/11/2020  . Hypothyroidism   . GERD (gastroesophageal reflux disease)   . Hepatic steatosis   . Thrombocytosis   . Macrocytic anemia   . AVN (avascular necrosis of bone) (HCC) 07/07/2020  . Back pain 03/24/2019  . Infertility, female 03/24/2019  . PCOS (polycystic ovarian syndrome) 03/24/2019  . Human immunodeficiency virus (HIV) disease (HCC) 04/24/2018  . Health care maintenance 04/24/2018  . Hyperlipidemia 04/10/2017  . Community acquired pneumonia of right upper lobe of lung 04/16/2016  . Acute respiratory failure with hypoxia (HCC)  04/16/2016  . Acute bronchitis 04/16/2016  . Hypertension 04/16/2016  . Class 3 obesity 03/21/2016  . Screening examination for venereal disease 03/30/2014  . Encounter for long-term (current) use of medications 03/30/2014  . Idiopathic thrombocytopenic purpura (ITP) (HCC) 11/14/2010  . ADHD (attention deficit hyperactivity disorder) 11/14/2010   Past Medical History:  Diagnosis Date  . ADHD   . Anxiety   . Asthma    usually with colds  . Depression   . Elevated LFTs   . Fatty liver 04/16/2016   Noted [place] ABD  . GERD (gastroesophageal reflux disease)   . Heart murmur    at birth, no issues  . History of heat stroke    with syncope  . History of thrombocytopenia    prior to diagnosis HIV  . HIV (human immunodeficiency virus infection) (HCC)   . Hyperlipidemia   . Hypertension   . Hypothyroidism   . Obese   . PCOS (polycystic ovarian syndrome)   . Pneumonia 04/17/2016  . Wears partial dentures    upper  . Wrist fracture 11/2017   Left    Family History  Problem Relation Age of Onset  . Hypothyroidism Mother   . Hypertension Father     Past Surgical History:  Procedure Laterality Date  . HYSTEROSCOPY N/A 12/31/2017   Procedure: HYSTEROSCOPY and polypectomy;  Surgeon: Fermin Schwab, MD;  Location: [facility];  Service: Gynecology;  Laterality: N/A;  . I & D EXTREMITY Left 07/15/2020   Procedure: PARTIAL EXCISION LEFT TIBIA;  Surgeon: [name], MD;  Location: [facility] OR;  Service: Orthopedics;  Laterality: Left;  . maxillofacial surgery     Social History   Occupational History  . Not on file  Tobacco Use  . Smoking status: Former Smoker    Packs/day: 0.30    Years: 15.00    Pack years: 4.50    Types: Cigarettes  . Smokeless tobacco: Never Used  Vaping Use  . Vaping Use: Never used  Substance and Sexual Activity  . Alcohol use: Yes    Alcohol/week: 7.0 standard drinks    Types: 7 Glasses of wine per week    Comment: wine  2-3 times a  week  . Drug use: Not Currently    Types: "Crack" cocaine    Comment: Previous hx of crack use - clean for 7 years   . Sexual activity: Not Currently    Partners: Male    Birth control/protection: None    Comment: try to conceive

## 2020-07-27 ENCOUNTER — Encounter (HOSPITAL_BASED_OUTPATIENT_CLINIC_OR_DEPARTMENT_OTHER): Payer: Self-pay | Admitting: Obstetrics and Gynecology

## 2020-07-27 ENCOUNTER — Encounter (HOSPITAL_COMMUNITY): Payer: Self-pay | Admitting: Emergency Medicine

## 2020-07-27 ENCOUNTER — Other Ambulatory Visit: Payer: Self-pay

## 2020-07-27 ENCOUNTER — Emergency Department (HOSPITAL_COMMUNITY)
Admission: EM | Admit: 2020-07-27 | Discharge: 2020-07-28 | Disposition: A | Discharge status: Home / Self Care | Payer: 59 | Attending: Emergency Medicine | Admitting: Emergency Medicine

## 2020-07-27 ENCOUNTER — Emergency Department (HOSPITAL_BASED_OUTPATIENT_CLINIC_OR_DEPARTMENT_OTHER)
Admission: EM | Admit: 2020-07-27 | Discharge: 2020-07-27 | Discharge status: Against Medical Advice | Payer: 59 | Attending: Emergency Medicine | Admitting: Emergency Medicine

## 2020-07-27 DIAGNOSIS — J45909 Unspecified asthma, uncomplicated: Secondary | ICD-10-CM | POA: Insufficient documentation

## 2020-07-27 DIAGNOSIS — Z21 Asymptomatic human immunodeficiency virus [HIV] infection status: Secondary | ICD-10-CM | POA: Diagnosis not present

## 2020-07-27 DIAGNOSIS — Z5321 Procedure and treatment not carried out due to patient leaving prior to being seen by health care provider: Secondary | ICD-10-CM | POA: Insufficient documentation

## 2020-07-27 DIAGNOSIS — T82868A Thrombosis of vascular prosthetic devices, implants and grafts, initial encounter: Secondary | ICD-10-CM | POA: Insufficient documentation

## 2020-07-27 DIAGNOSIS — E039 Hypothyroidism, unspecified: Secondary | ICD-10-CM | POA: Insufficient documentation

## 2020-07-27 DIAGNOSIS — T82594A Other mechanical complication of infusion catheter, initial encounter: Secondary | ICD-10-CM | POA: Insufficient documentation

## 2020-07-27 DIAGNOSIS — Y840 Cardiac catheterization as the cause of abnormal reaction of the patient, or of later complication, without mention of misadventure at the time of the procedure: Secondary | ICD-10-CM | POA: Diagnosis not present

## 2020-07-27 DIAGNOSIS — Z79899 Other long term (current) drug therapy: Secondary | ICD-10-CM | POA: Insufficient documentation

## 2020-07-27 DIAGNOSIS — I10 Essential (primary) hypertension: Secondary | ICD-10-CM | POA: Insufficient documentation

## 2020-07-27 DIAGNOSIS — T82898A Other specified complication of vascular prosthetic devices, implants and grafts, initial encounter: Secondary | ICD-10-CM

## 2020-07-27 DIAGNOSIS — Y828 Other medical devices associated with adverse incidents: Secondary | ICD-10-CM | POA: Insufficient documentation

## 2020-07-27 DIAGNOSIS — Z91040 Latex allergy status: Secondary | ICD-10-CM | POA: Insufficient documentation

## 2020-07-27 DIAGNOSIS — Z87891 Personal history of nicotine dependence: Secondary | ICD-10-CM | POA: Diagnosis not present

## 2020-07-27 HISTORY — DX: Idiopathic aseptic necrosis of unspecified bone: M87.00

## 2020-07-27 MED ORDER — ALTEPLASE 2 MG IJ SOLR: 2.0000 mg | Freq: Once | INTRAMUSCULAR | Status: DC

## 2020-07-27 NOTE — ED Triage Notes (Signed)
Patient reports to the ER for PICC line problems. Patient reports she cannot get blood access back from her PICC. Patient states she has PICC for IV antibiotics

## 2020-07-27 NOTE — ED Provider Notes (Signed)
Patient placed in Quick Look pathway, seen and evaluated   Chief Complaint: Pt reports picc line is not working   HPI:   Pt reports picc line would not pull back blood   ROS: no fever no chills   Physical Exam:   Gen: No distress  Neuro: Awake and Alert  Skin: Warm    Focused Exam: PICC line in right arm   Initiation of care has begun. The patient has been counseled on the process, plan, and necessity for staying for the completion/evaluation, and the remainder of the medical screening examination   Osie Cheeks 07/27/20 2047    Gwyneth Sprout, MD 07/27/20 2303

## 2020-07-27 NOTE — ED Provider Notes (Signed)
[facility] EMERGENCY DEPT Provider Note   CSN: [id number] Arrival date & time: 07/27/20  1803     History Chief Complaint  Patient presents with  . Vascular Access Problem    Lindsay French is a 40 y.o. female.  Patient presents chief complaint of complication to her right upper extremity PICC line.  She says she had a place a few weeks ago and is taking Ancef at home 2 g 3 times a day.  She took 1 this morning and is due for her midday dose, however she noticed that her right upper extremity PICC line would not draw back blood.  She presents to the ER for further evaluation denies fevers or cough no vomiting or diarrhea.  She had a PICC nurse come once a week and evaluate her PICC line at home and stated that there was no recent problem with it previously.        Past Medical History:  Diagnosis Date  . ADHD   . Anxiety   . Asthma    usually with colds  . Avascular necrosis ([place])   . Depression   . Elevated LFTs   . Fatty liver 04/16/2016   Noted [place] ABD  . GERD (gastroesophageal reflux disease)   . Heart murmur    at birth, no issues  . History of heat stroke    with syncope  . History of thrombocytopenia    prior to diagnosis HIV  . HIV (human immunodeficiency virus infection) ([place])   . Hyperlipidemia   . Hypertension   . Hypothyroidism   . Obese   . PCOS (polycystic ovarian syndrome)   . Pneumonia 04/17/2016  . Wears partial dentures    upper  . Wrist fracture 11/2017   Left    Patient Active Problem List   Diagnosis Date Noted  . Arthralgia of left lower leg   . Chronic osteomyelitis of left tibia with draining sinus ([place])   . Cellulitis of left leg   . Severe protein-calorie malnutrition ([place])   . Avascular necrosis of bone ([place]) 07/11/2020  . Hypothyroidism   . GERD (gastroesophageal reflux disease)   . Hepatic steatosis   . Thrombocytosis   . Macrocytic anemia   . AVN (avascular necrosis of bone) ([place]) 07/07/2020  . Back pain  03/24/2019  . Infertility, female 03/24/2019  . PCOS (polycystic ovarian syndrome) 03/24/2019  . Human immunodeficiency virus (HIV) disease ([place]) 04/24/2018  . Health care maintenance 04/24/2018  . Hyperlipidemia 04/10/2017  . Community acquired pneumonia of right upper lobe of lung 04/16/2016  . Acute respiratory failure with hypoxia ([place]) 04/16/2016  . Acute bronchitis 04/16/2016  . Hypertension 04/16/2016  . Class 3 obesity 03/21/2016  . Screening examination for venereal disease 03/30/2014  . Encounter for long-term (current) use of medications 03/30/2014  . Idiopathic thrombocytopenic purpura (ITP) (HCC) 11/14/2010  . ADHD (attention deficit hyperactivity disorder) 11/14/2010    Past Surgical History:  Procedure Laterality Date  . HYSTEROSCOPY N/A 12/31/2017   Procedure: HYSTEROSCOPY and polypectomy;  Surgeon: Governor Specking, MD;  Location: [facility];  Service: Gynecology;  Laterality: N/A;  . I & D EXTREMITY Left 07/15/2020   Procedure: PARTIAL EXCISION LEFT TIBIA;  Surgeon: Newt Minion, MD;  Location: [facility];  Service: Orthopedics;  Laterality: Left;  . maxillofacial surgery       OB History   No obstetric history on file.     Family History  Problem Relation Age of Onset  . Hypothyroidism  Mother   . Hypertension Father     Social History   Tobacco Use  . Smoking status: Former Smoker    Packs/day: 0.30    Years: 15.00    Pack years: 4.50    Types: Cigarettes  . Smokeless tobacco: Never Used  Vaping Use  . Vaping Use: Never used  Substance Use Topics  . Alcohol use: Yes    Alcohol/week: 7.0 standard drinks    Types: 7 Glasses of wine per week    Comment: wine  2-3 times a week  . Drug use: Not Currently    Types: "Crack" cocaine    Comment: Previous hx of crack use - clean for 7 years     Home Medications Prior to Admission medications   Medication Sig Start Date End Date Taking? Authorizing Provider  albuterol (VENTOLIN HFA) 108  (90 Base) MCG/ACT inhaler Inhale 1 puff into the lungs every 6 (six) hours as needed for wheezing or shortness of breath. 02/04/20   [provider]  ALPRAZolam Duanne Moron) 0.5 MG tablet Take 0.5 mg by mouth 2 (two) times daily as needed for anxiety.  12/21/15   [provider]  amphetamine-dextroamphetamine (ADDERALL XR) 20 MG 24 hr capsule Take 20 mg by mouth every morning. 04/11/20   [provider]  Ascorbic Acid (VITAMIN C) 1000 MG tablet Take 1,000 mg by mouth daily.    [provider]  busPIRone (BUSPAR) 15 MG tablet Take 15 mg by mouth 2 (two) times daily. 06/20/20   [provider]  ceFAZolin (ANCEF) IVPB Inject 2 g into the vein every 8 (eight) hours. Indication:  Osteomyelitis First Dose: No Last Day of Therapy:  08/27/20 Labs - Once weekly:  CBC/D and BMP, Labs - Every other week:  ESR and CRP Method of administration: IV Push Method of administration may be changed at the discretion of home infusion pharmacist based upon assessment of the patient and/or caregiver's ability to self-administer the medication ordered. 07/18/20   Hosie Poisson, MD  Cyanocobalamin (VITAMIN B 12 PO) Take 1,000 mcg by mouth daily.    [provider]  docusate sodium (COLACE) 100 MG capsule Take 100 mg by mouth daily.    [provider]  [name] 50-300 MG TABS TAKE 1 TABLET BY MOUTH DAILY 07/01/20 09/29/20  Campbell Riches, MD  gabapentin (NEURONTIN) 100 MG capsule Take 300 mg by mouth at bedtime.    [provider]  irbesartan (AVAPRO) 150 MG tablet Take 150 mg by mouth every evening. 02/06/19   [provider]  ketorolac (TORADOL) 10 MG tablet Take 1 tablet (10 mg total) by mouth every 8 (eight) hours as needed for moderate pain. 07/18/20   Hosie Poisson, MD  melatonin 5 MG TABS Take 15 mg by mouth at bedtime.    [provider]  MELATONIN PO Take 1 tablet by mouth at bedtime as needed (sleep).    [provider]   methocarbamol (ROBAXIN) 500 MG tablet Take 1 tablet (500 mg total) by mouth every 6 (six) hours as needed for muscle spasms. 07/18/20   Hosie Poisson, MD  Nutritional Supplements (,FEEDING SUPPLEMENT, PROSOURCE PLUS) liquid Take 30 mLs by mouth 2 (two) times daily with a meal. 07/18/20   Hosie Poisson, MD  OVER THE COUNTER MEDICATION Take 400 mcg by mouth daily. Methyl Folate 400 mcg    [provider]  OVER THE COUNTER MEDICATION Take 50 mg by mouth daily. P-5-P    [provider]  polyethylene glycol (MIRALAX / GLYCOLAX) 17 g packet Take 17 g by mouth daily.    [provider]  venlafaxine XR (EFFEXOR-XR) 75 MG 24 hr capsule Take 225 mg by mouth every evening. 12/21/15   [provider]    Allergies    Guaifenesin, Hydrocodone, Lactose intolerance (gi), and Latex  Review of Systems   Review of Systems  Constitutional: Negative for fever.  HENT: Negative for ear pain.   Eyes: Negative for pain.  Respiratory: Negative for cough.   Cardiovascular: Negative for chest pain.  Gastrointestinal: Negative for abdominal pain.  Genitourinary: Negative for flank pain.  Musculoskeletal: Negative for back pain.  Skin: Negative for rash.  Neurological: Negative for headaches.    Physical Exam Updated Vital Signs BP 124/82   Pulse 99   Temp 98.5 F (36.9 C) (Oral)   Resp 16   Ht '5\' 4"'  (1.626 m)   Wt 106 kg   LMP 07/27/2016 (Approximate)   SpO2 99%   BMI 40.11 kg/m   Physical Exam Constitutional:      General: She is not in acute distress.    Appearance: Normal appearance.  HENT:     Head: Normocephalic.     Nose: Nose normal.  Eyes:     Extraocular Movements: Extraocular movements intact.  Cardiovascular:     Rate and Rhythm: Normal rate.  Pulmonary:     Effort: Pulmonary effort is normal.  Musculoskeletal:        General: Normal range of motion.     Cervical back: Normal range of motion.  Skin:    Comments: Right upper extremity PICC line  site dressing was removed by the patient prior to arrival.  PICC insertion site appears clean dry and intact with no purulent discharge no erythema no tenderness on eval.  Neurological:     General: No focal deficit present.     Mental Status: She is alert. Mental status is at baseline.     ED Results / Procedures / Treatments   Labs (all labs ordered are listed, but only abnormal results are displayed) Labs Reviewed - No data to display  EKG None  Radiology No results found.  Procedures Procedures   Medications Ordered in ED Medications  alteplase (CATHFLO ACTIVASE) injection 2 mg (has no administration in time range)    ED Course  I have reviewed the triage vital signs and the nursing notes.  Pertinent labs & imaging results that were available during my care of the patient were reviewed by me and considered in my medical decision making (see chart for details).    MDM Rules/Calculators/A&P                          I advised the patient that we did not have the PICC team here.  However we did have TPA and I ordered the nurse to give a small amount of TPA to see if we can declog the line.  I did also offer her to give an IV dose of Ancef here in the ER, however she refused a splint.  However I was later informed by nursing team that the patient had eloped from the ER when I was not present.   Final Clinical Impression(s) / ED Diagnoses Final diagnoses:  Occlusion of peripherally inserted central catheter (PICC) line, initial encounter [facility])    Rx / [place] Orders ED Discharge Orders    None       [place],  Greggory Brandy, MD 07/27/20 [phone]

## 2020-07-27 NOTE — ED Notes (Signed)
We had informed pt. That we were in the process of being advised by an IV team nurse. Pt. Would not allow [place] to attempt to flush her P.I.C.C. line because "there is already air in it and I don't want you to give me an air embolus". She leaves now and refuses to sign AMA form. She angrily states "I guess I'll need to go somewhere else; you don't have a P.I.C.C. nurse, so what's the point?" She is ambulatory without difficulty.

## 2020-07-27 NOTE — ED Notes (Signed)
Pt stated "I'm leaving, my doctor said I could go home and call the office tomorrow morning." Moving OTF.

## 2020-07-27 NOTE — ED Triage Notes (Signed)
Pt presents to ED POv. Pt c/o no blood return from PICC line. Pt also reports the catheter filling up with air when she pulls back instead of blood. Denies any pain w/ last infusion

## 2020-07-28 ENCOUNTER — Telehealth: Payer: Self-pay | Admitting: Orthopedic Surgery

## 2020-07-28 ENCOUNTER — Telehealth: Payer: Self-pay | Admitting: *Deleted

## 2020-07-28 NOTE — Telephone Encounter (Signed)
Patient called to report occluded PICC line. She is on IV cefazolin for at least 4 more weeks. Went to ER last night, but left before they were able to assess the line. The last time she was able to infuse medication was 07/27/20 10 am.  RN contacted home health pharmacy, [facility]. Per Jonny Ruiz, pharmacist, patient's insurance will cover cathflow in the home, but they do not have orders for this. RN faxed written orders for Cathflow to occluded PICC per [facility]'s protocol, signed by Dr Renold Don. [facility] will coordinate nursing visit for cathflow administration.  Patient aware.  Attempted to notify patient's home health nurse, but was unable to leave her a voicemail. [place], [place] - [phone]. [facility] infusion pharmacy [phone] (fax [phone]).   Andree Coss, RN

## 2020-07-28 NOTE — Telephone Encounter (Signed)
Em from [facility] called for pre authorization on behalf of patient. Evans Lance is asking for a call back at [phone] with reference number [id number].

## 2020-07-28 NOTE — Telephone Encounter (Signed)
Thanks for helping arrange her cathflow!

## 2020-07-29 NOTE — Telephone Encounter (Signed)
I have tried calling back and get sent to other departments and noone seems to know why they left a message, so we can just wait until someone calls back?

## 2020-08-04 ENCOUNTER — Other Ambulatory Visit: Payer: Self-pay

## 2020-08-04 ENCOUNTER — Encounter: Payer: Self-pay | Admitting: Infectious Diseases

## 2020-08-04 ENCOUNTER — Ambulatory Visit: Payer: 59 | Admitting: Infectious Diseases

## 2020-08-04 DIAGNOSIS — M86062 Acute hematogenous osteomyelitis, left tibia and fibula: Secondary | ICD-10-CM | POA: Insufficient documentation

## 2020-08-04 DIAGNOSIS — B20 Human immunodeficiency virus [HIV] disease: Secondary | ICD-10-CM | POA: Diagnosis not present

## 2020-08-04 NOTE — Assessment & Plan Note (Signed)
Her PIC looks good Wound is clean. See photo Her d/c (like snot, yellow, adherent) is what I would expect (escpecially given silver alginate dressing).  Will continue her Ancef as planned, til 08-27-20.  Will see her back around that time Will get her f/u app with Dr Lajoyce Corners

## 2020-08-04 NOTE — Assessment & Plan Note (Signed)
She is on her ART Will defer her labs until she f/u visits.

## 2020-08-04 NOTE — Progress Notes (Signed)
Subjective:    Patient ID: Lindsay French, female    DOB: [birth year], 40 y.o.   MRN: [id number]  HPI 40yo F with HIV+ (dx 2008) prev on atripla -->truvada/prezcobix --->complera-->odefsy. She has had LLE pain since November.  I got a my chart message from Lindsay French this week that she had AVN (MRI [place]) but also had erythema over her L shin. She has been seen by [facility] and [facility].  I started her on keflex. 07-05-20 She has significant pain. Taking Morphine tid and oxycodone tid.  MRI at OSH: Geographic signal abnormality in the proximal tibia and distal fibular (partially imaged). Findings are similar nonspecific but most consistent with severe avascular necrosis. However, given degree of surrounding edema and periosteal reaction, unable to exclude infection or marrow infiltrative process (such as leukemia/lymphoma). Recommend correlation with laboratory data and any systemic sources predisposing patient to AVN (such as sickle cell disease). Depending on these findings, recommend short interval follow-up MRI which also includes the femur.  I saw her in ID f/u on 3-17- I sent her for urgent MRI (see results) due to worsening pain and swelling.  She continued on keflex with no improvement.  She states she had low grade f/c at home.  I reviewed her MRI on 3-18 and called her to come to hospital on 3-19 twice. She called me back on 3-21 and I encouraged her to come to the hospital due to the fluid collection on her MRI.  She was adm to hospital 3-21, her anbx were stopped, and on 3-25 underwent  I & D, partial excision L tibia and abscess. Her Cx grew MSSA She was d/c home on 3-28 with plan for IV ancef via PIC until 5-7. She returned to ED on 4-6 due to [facility] malfunction, she had cathflow 4-7 but did not use. She states that since she had 2 mL NS flush, it resolved.   ESR 128 (07-18-20) CRP 3.9   She still has pain (taking oxycodone). Feels like her ability to do ADLs is  improving. Wound is healing.  PIC is working well. No erythema or tenderness.  Had f/u appt with surgery, had VAC removed. Is not clear if she has f/u appt.  Mother is here helping her with her daycare.  No f/c.   Review of Systems  Constitutional: Negative for chills and fever.  HENT: Negative for mouth sores.   Respiratory: Negative for cough and shortness of breath.   Gastrointestinal: Positive for constipation. Negative for diarrhea.  Genitourinary: Negative for difficulty urinating.  Musculoskeletal:       Bone, wound pain.        Objective:   Physical Exam Vitals reviewed.  Constitutional:      Appearance: Normal appearance.  HENT:     Mouth/Throat:     Mouth: Mucous membranes are moist.     Pharynx: No oropharyngeal exudate.  Eyes:     Extraocular Movements: Extraocular movements intact.     Pupils: Pupils are equal, round, and reactive to light.  Cardiovascular:     Rate and Rhythm: Normal rate and regular rhythm.  Pulmonary:     Effort: Pulmonary effort is normal.     Breath sounds: Normal breath sounds.  Abdominal:     General: Bowel sounds are normal. There is no distension.     Palpations: Abdomen is soft.     Tenderness: There is no abdominal tenderness.  Musculoskeletal:     Cervical back: Normal range of motion.  Neurological:     General: No focal deficit present.     Mental Status: She is alert.            Assessment & Plan:

## 2020-08-09 ENCOUNTER — Ambulatory Visit: Payer: 59 | Admitting: Orthopedic Surgery

## 2020-08-11 ENCOUNTER — Encounter: Payer: Self-pay | Admitting: Orthopedic Surgery

## 2020-08-11 ENCOUNTER — Ambulatory Visit (INDEPENDENT_AMBULATORY_CARE_PROVIDER_SITE_OTHER): Payer: 59 | Admitting: Physician Assistant

## 2020-08-11 ENCOUNTER — Telehealth: Payer: Self-pay

## 2020-08-11 DIAGNOSIS — M86462 Chronic osteomyelitis with draining sinus, left tibia and fibula: Secondary | ICD-10-CM

## 2020-08-11 DIAGNOSIS — M86062 Acute hematogenous osteomyelitis, left tibia and fibula: Secondary | ICD-10-CM

## 2020-08-11 NOTE — Telephone Encounter (Signed)
Patient called office today to schedule an appointment at infusion center. States she spoke with Dr. Ninetta Lights and instructed to call office for set up.  Patient states that on Sunday her picc line dressing was coming off. Is complaining of white/yellow discharge near picc site. States it is tender.  Not appointments available at [facility] until next week.  Will forward message to provider.  Patient was instructed by home health to go to ED for picc evaluation.  Juanita Laster, RMA

## 2020-08-11 NOTE — Progress Notes (Signed)
Office Visit Note   Patient: Lindsay French           Date of Birth: 1981/01/28           MRN: [id number] Visit Date: 08/11/2020              Requested by: Adrienne Mocha, PA [address]  [place],  [place] [address] PCP: Adrienne Mocha, PA  Chief Complaint  Patient presents with  . Left Leg - Wound Check      HPI: Patient is 4 weeks status post partial excision of left tibia with placement of antibiotic beads.  She is currently also getting IV antibiotics via PICC line.  Overall she is doing well.  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  She also has noticed that she is having some trouble drawing blood on the PICC line.  Current antibiotic is  Assessment & Plan: Visit Diagnoses: No diagnosis found.  Plan: I have asked that she obtain a size large vive compression stocking.  She should try to wear this 24 hours a day but at least when she is up on her feet.  I have contacted the home health care agency and asked that they check her PICC line and make sure it is probably properly functioning.  Follow-up in 1 week.  I did remove 1 proximal suture  Follow-Up Instructions: No follow-ups on file.   Ortho Exam  Patient is alert, oriented, no adenopathy, well-dressed, normal affect, normal respiratory effort. Well apposed wound edges the central portion of about 2 cm has some slight wound dehiscence with a serous clear drainage.  No ascending cellulitis or erythema no foul odor  Imaging: No results found. No images are attached to the encounter.  Labs: Lab Results  Component Value Date   ESRSEDRATE 128 (H) 07/18/2020   CRP 3.9 (H) 07/18/2020   REPTSTATUS 07/20/2020 FINAL 07/15/2020   GRAMSTAIN  07/15/2020    ABUNDANT WBC PRESENT, PREDOMINANTLY PMN NO ORGANISMS SEEN    CULT  07/15/2020    RARE STAPHYLOCOCCUS AUREUS CRITICAL RESULT CALLED TO, READ BACK BY AND VERIFIED WITH: RN Kerby Moors  [phone] FCP NO ANAEROBES  ISOLATED Performed at [facility], [address], [place], [place] [address]    [facility] STAPHYLOCOCCUS AUREUS 07/15/2020     Lab Results  Component Value Date   ALBUMIN 2.5 (L) 07/13/2020   ALBUMIN 2.9 (L) 07/12/2020   ALBUMIN 3.1 (L) 07/11/2020   ALBUMIN 3.2 (L) 07/11/2020    Lab Results  Component Value Date   MG 2.0 07/11/2020   No results found for: VD25OH  No results found for: PREALBUMIN CBC EXTENDED Latest Ref Rng & Units 07/18/2020 07/15/2020 07/13/2020  WBC 4.0 - 10.5 K/uL 8.6 6.7 7.8  RBC 3.87 - 5.11 MIL/uL 2.49(L) 2.65(L) 2.66(L)  HGB 12.0 - 15.0 g/dL 8.3(L) 9.3(L) 9.1(L)  HCT 36.0 - 46.0 % 26.7(L) 28.3(L) 29.7(L)  PLT 150 - 400 K/uL 412(H) 389 405(H)  NEUTROABS 1.7 - 7.7 K/uL - 4.7 -  LYMPHSABS 0.7 - 4.0 K/uL - 1.3 -     There is no height or weight on file to calculate BMI.  Orders:  No orders of the defined types were placed in this encounter.  No orders of the defined types were placed in this encounter.    Procedures: No procedures performed  Clinical Data: No additional findings.  ROS:  All other systems negative,  except as noted in the HPI. Review of Systems  Objective: Vital Signs: There were no vitals taken for this visit.  Specialty Comments:  No specialty comments available.  PMFS History: Patient Active Problem List   Diagnosis Date Noted  . Acute hematogenous osteomyelitis of left tibia (HCC) 08/04/2020  . Arthralgia of left lower leg   . Chronic osteomyelitis of left tibia with draining sinus (HCC)   . Cellulitis of left leg   . Severe protein-calorie malnutrition (HCC)   . Hypothyroidism   . GERD (gastroesophageal reflux disease)   . Hepatic steatosis   . Thrombocytosis   . Macrocytic anemia   . Back pain 03/24/2019  . Infertility, female 03/24/2019  . PCOS (polycystic ovarian syndrome) 03/24/2019  . Human immunodeficiency virus (HIV) disease (HCC) 04/24/2018  . Health care maintenance 04/24/2018  .  Hyperlipidemia 04/10/2017  . Community acquired pneumonia of right upper lobe of lung 04/16/2016  . Acute respiratory failure with hypoxia (HCC) 04/16/2016  . Acute bronchitis 04/16/2016  . Hypertension 04/16/2016  . Class 3 obesity 03/21/2016  . Screening examination for venereal disease 03/30/2014  . Encounter for long-term (current) use of medications 03/30/2014  . Idiopathic thrombocytopenic purpura (ITP) (HCC) 11/14/2010  . ADHD (attention deficit hyperactivity disorder) 11/14/2010   Past Medical History:  Diagnosis Date  . ADHD   . Anxiety   . Asthma    usually with colds  . Avascular necrosis (HCC)   . Depression   . Elevated LFTs   . Fatty liver 04/16/2016   Noted [place] ABD  . GERD (gastroesophageal reflux disease)   . Heart murmur    at birth, no issues  . History of heat stroke    with syncope  . History of thrombocytopenia    prior to diagnosis HIV  . HIV (human immunodeficiency virus infection) (HCC)   . Hyperlipidemia   . Hypertension   . Hypothyroidism   . Obese   . PCOS (polycystic ovarian syndrome)   . Pneumonia 04/17/2016  . Wears partial dentures    upper  . Wrist fracture 11/2017   Left    Family History  Problem Relation Age of Onset  . Hypothyroidism Mother   . Hypertension Father     Past Surgical History:  Procedure Laterality Date  . HYSTEROSCOPY N/A 12/31/2017   Procedure: HYSTEROSCOPY and polypectomy;  Surgeon: Fermin Schwab, MD;  Location: [facility];  Service: Gynecology;  Laterality: N/A;  . I & D EXTREMITY Left 07/15/2020   Procedure: PARTIAL EXCISION LEFT TIBIA;  Surgeon: [name], MD;  Location: [facility] OR;  Service: Orthopedics;  Laterality: Left;  . maxillofacial surgery     Social History   Occupational History  . Not on file  Tobacco Use  . Smoking status: Former Smoker    Packs/day: 0.30    Years: 15.00    Pack years: 4.50    Types: Cigarettes  . Smokeless tobacco: Never Used  Vaping Use  . Vaping  Use: Never used  Substance and Sexual Activity  . Alcohol use: Yes    Alcohol/week: 7.0 standard drinks    Types: 7 Glasses of wine per week    Comment: wine  2-3 times a week  . Drug use: Not Currently    Types: "Crack" cocaine    Comment: Previous hx of crack use - clean for 7 years   . Sexual activity: Not Currently    Partners: Male    Birth control/protection: None  Comment: try to conceive

## 2020-08-11 NOTE — Telephone Encounter (Signed)
Emailed authorization form to patient as requested.

## 2020-08-11 NOTE — Telephone Encounter (Signed)
Patient called she is requesting a medical records requesting form to be emailed to her to the e-mail on file she is requesting today's appointment notes to be faxed to Su Hoff at [facility] call back:[phone]

## 2020-08-12 ENCOUNTER — Ambulatory Visit (HOSPITAL_COMMUNITY)
Admission: RE | Admit: 2020-08-12 | Discharge: 2020-08-12 | Disposition: A | Discharge status: Home / Self Care | Payer: 59 | Source: Ambulatory Visit | Attending: Infectious Disease | Admitting: Infectious Disease

## 2020-08-12 ENCOUNTER — Other Ambulatory Visit: Payer: Self-pay

## 2020-08-12 ENCOUNTER — Other Ambulatory Visit: Payer: Self-pay | Admitting: Infectious Disease

## 2020-08-12 DIAGNOSIS — M86062 Acute hematogenous osteomyelitis, left tibia and fibula: Secondary | ICD-10-CM

## 2020-08-12 DIAGNOSIS — M86462 Chronic osteomyelitis with draining sinus, left tibia and fibula: Secondary | ICD-10-CM

## 2020-08-12 HISTORY — PX: IR RADIOLOGIST EVAL & MGMT: IMG5224

## 2020-08-12 MED ORDER — CEPHALEXIN 500 MG PO CAPS: 500.0000 mg | ORAL_CAPSULE | Freq: Four times a day (QID) | ORAL | 0 refills | Status: DC

## 2020-08-12 NOTE — Telephone Encounter (Addendum)
Spoke with [facility]. Patient can go there today at 1:00 for PICC pull. She accepted appointment, will go to Admitting at [facility]. She will keep appointment Monday at 2:00 for PICC placement at [facility]. Will need oral antibioitcs sent to [facility].  RN encouraged her to use MyChart for any concerns or to call the office directly instead of texting Dr Ninetta Lights, as Dr Ninetta Lights is out of the office. Sent link for her to reset her Mychart access. Will send her message with appointment information. [facility] pharmacy updated. Andree Coss, RN

## 2020-08-12 NOTE — Addendum Note (Signed)
Addended by: Andree Coss on: 08/12/2020 11:25 AM   Modules accepted: Orders

## 2020-08-12 NOTE — Addendum Note (Signed)
Addended by: Juanita Laster on: 08/12/2020 11:22 AM   Modules accepted: Orders

## 2020-08-12 NOTE — Telephone Encounter (Signed)
Dr Ninetta Lights called Triage for update, is aware and in agreement with plan. Andree Coss, RN

## 2020-08-12 NOTE — Telephone Encounter (Signed)
Called patient with picc line appointment for Monday at 2. Patient is okay with appointment.  Informed her that Dr. Daiva Eves would like for her to stop infusing antibiotics and have her picc removed and replaced. Patient stated that Home health will not touch her picc line and recommended she go to ED on 4/20. States that on Sunday she did her own dressing change with the assistance from her friend who is a Administrator, [employer]. Noticed picc line did come out a little bit and was evaluated by home health on Monday 4/18. Home health did not voice any concerns at that visit.  Patient has noticed increase tenderness in arm. Is not sure if there is any puss today due to bandage. Patient does not want to go to ED due to wait time and anxiety. Advised patient that if tenderness in increasing then she should go to ED for picc replacement today.  Will forward message to Triage for assistance.

## 2020-08-12 NOTE — Addendum Note (Signed)
Addended by: Juanita Laster on: 08/12/2020 09:31 AM   Modules accepted: Orders

## 2020-08-12 NOTE — Progress Notes (Signed)
Patient ID: Lindsay French, female   DOB: [birth year], 40 y.o.   MRN: [id number] Patient's right upper extremity PICC was removed in its entirety without immediate complications.  Gauze dressing applied over site.  Patient scheduled for new PICC placement at [facility] on 4/25.

## 2020-08-12 NOTE — Telephone Encounter (Signed)
Reached out to IR to schedule picc line evaluation/replacement per Dr. Daiva Eves. Patient would like earliest available appointment.

## 2020-08-15 ENCOUNTER — Other Ambulatory Visit: Payer: Self-pay | Admitting: Infectious Disease

## 2020-08-15 ENCOUNTER — Ambulatory Visit (HOSPITAL_COMMUNITY)
Admission: RE | Admit: 2020-08-15 | Discharge: 2020-08-15 | Disposition: A | Discharge status: Home / Self Care | Payer: 59 | Source: Ambulatory Visit | Attending: Infectious Disease | Admitting: Infectious Disease

## 2020-08-15 ENCOUNTER — Other Ambulatory Visit: Payer: Self-pay

## 2020-08-15 DIAGNOSIS — M86062 Acute hematogenous osteomyelitis, left tibia and fibula: Secondary | ICD-10-CM | POA: Insufficient documentation

## 2020-08-15 IMAGING — XA IR PICC >5YO
2 series · 2 of 2 positions shown · non-contrast
Comparison: none

INDICATION: Patient with history of osteomyelitis of the left femur/tibia
complicated by AVN with prior partial excision of left tibia with
antibiotic bead placement now requiring long-term IV antibiotic
therapy. Request to IR for PICC placement.

[Series 1: fl (-) angio · 1 of 1 slices shown]
[im 1/1]
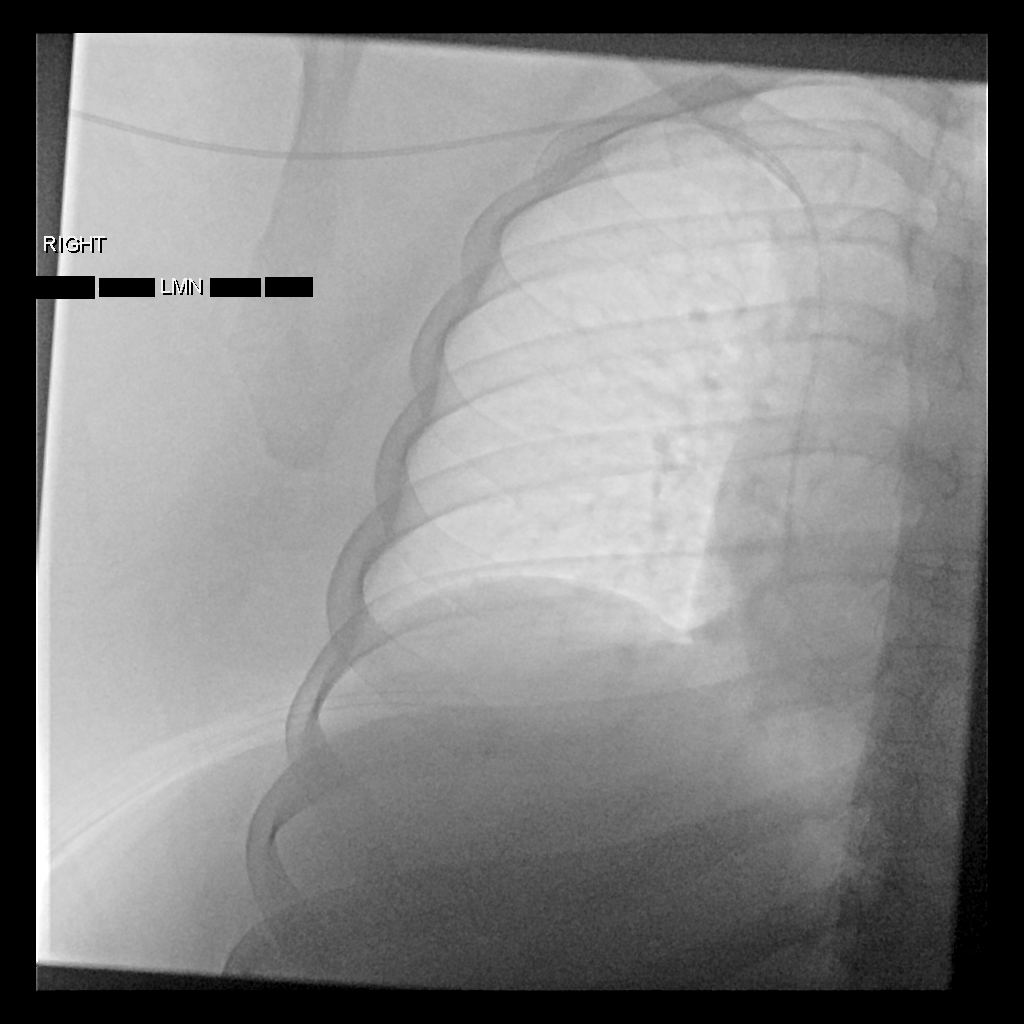

[Series 1: ir picc >5yo · 1 of 1 slices shown]
[im 1/1]
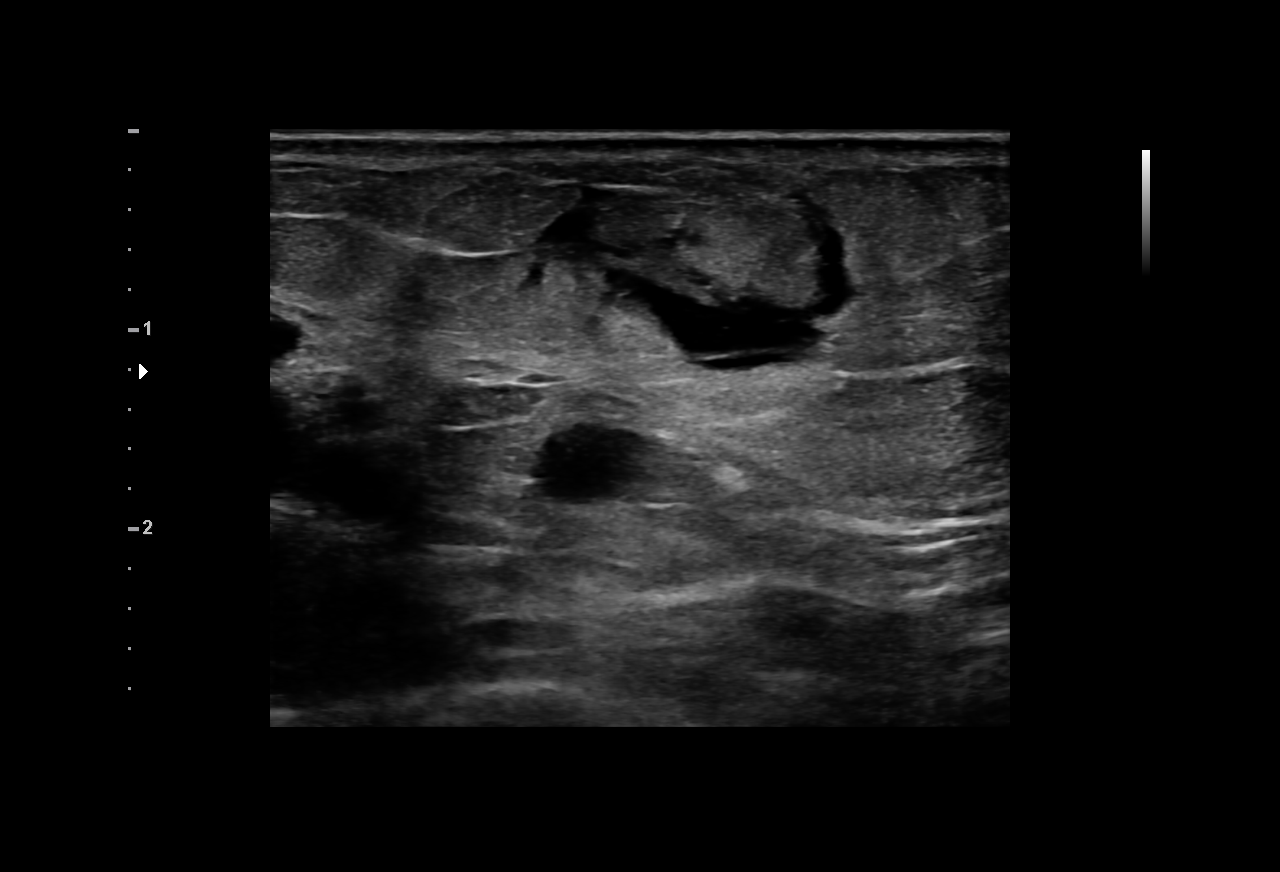

[2 of 2 positions shown; findings below may reference images not displayed]

EXAM:
RIGHT UPPER EXTREMITY PICC LINE PLACEMENT WITH ULTRASOUND AND
FLUOROSCOPIC GUIDANCE

MEDICATIONS:
2 mL 1% lidocaine

ANESTHESIA/SEDATION:
None.

FLUOROSCOPY TIME:  Fluoroscopy Time: 0 minutes 36 seconds (3 mGy).

COMPLICATIONS:
None immediate.

PROCEDURE:
The patient was advised of the possible risks and complications and
agreed to undergo the procedure. The patient was then brought to the
angiographic suite for the procedure.

The right arm was prepped with chlorhexidine, draped in the usual
sterile fashion using maximum barrier technique (cap and mask,
sterile gown, sterile gloves, large sterile sheet, hand hygiene and
cutaneous antisepsis) and infiltrated locally with 1% Lidocaine.

Ultrasound demonstrated patency of the right basilic vein, and this
was documented with an image. Under real-time ultrasound guidance,
this vein was accessed with a 21 gauge micropuncture needle and
image documentation was performed. A [DATE] wire was introduced in to
the vein. Over this, a 5 French single lumen power-injectable PICC
was advanced to the lower SVC/right atrial junction. Fluoroscopy
during the procedure and fluoro spot radiograph confirms appropriate
catheter position. The catheter was flushed, aspirated, capped and
covered with a sterile dressing.

Catheter length: 39 cm
IMPRESSION: Successful right arm Power PICC line placement with ultrasound and
fluoroscopic guidance. The catheter is ready for use.

Read by DON LOLITO

## 2020-08-15 MED ORDER — HEPARIN SOD (PORK) LOCK FLUSH 100 UNIT/ML IV SOLN
INTRAVENOUS | Status: AC
  Filled 2020-08-15: qty 5

## 2020-08-15 MED ORDER — LIDOCAINE HCL 1 % IJ SOLN
INTRAMUSCULAR | Status: DC | PRN
  Administered 2020-08-15: 10 mL

## 2020-08-15 MED ORDER — LIDOCAINE HCL 1 % IJ SOLN
INTRAMUSCULAR | Status: AC
  Filled 2020-08-15: qty 20

## 2020-08-19 ENCOUNTER — Ambulatory Visit (INDEPENDENT_AMBULATORY_CARE_PROVIDER_SITE_OTHER): Payer: 59 | Admitting: Physician Assistant

## 2020-08-19 ENCOUNTER — Encounter: Payer: Self-pay | Admitting: Physician Assistant

## 2020-08-19 DIAGNOSIS — M86462 Chronic osteomyelitis with draining sinus, left tibia and fibula: Secondary | ICD-10-CM

## 2020-08-19 NOTE — Progress Notes (Signed)
Office Visit Note   Patient: Lindsay French           Date of Birth: [birth year]           MRN: [id number] Visit Date: 08/19/2020              Requested by: Adrienne Mocha, PA [address]  [place],  [place] [address] PCP: Adrienne Mocha, PA  No chief complaint on file.     HPI: Patient is a pleasant 40 year old woman who is 1 month status post partial excision left tibia antibiotic bead placement with a gram of vancomycin and 240 mg of gentamicin.  She has been wearing a compression sock and wrapping an Ace wrap over it for any drainage she might had.  She feels the sock is helping her.  She does note that the drainage is slowing down.  She is on IV antibiotics which are due to timeout on May 8 Specifically Ancef Assessment & Plan: Visit Diagnoses: No diagnosis found.  Plan: Continue with compression socks.  She will follow-up in 2 weeks she has not seen Dr. Lajoyce Corners since surgery and wishes to see him at her next visit  Follow-Up Instructions: No follow-ups on file.   Ortho Exam  Patient is alert, oriented, no adenopathy, well-dressed, normal affect, normal respiratory effort. Examination well-healed surgical incision.  Sutures were harvested today.  She has 1 small area that she does have some continued serous drainage.  There is no surrounding erythema no ascending cellulitis no foul odor  Imaging: No results found. No images are attached to the encounter.  Labs: Lab Results  Component Value Date   ESRSEDRATE 128 (H) 07/18/2020   CRP 3.9 (H) 07/18/2020   REPTSTATUS 07/20/2020 FINAL 07/15/2020   GRAMSTAIN  07/15/2020    ABUNDANT WBC PRESENT, PREDOMINANTLY PMN NO ORGANISMS SEEN    CULT  07/15/2020    RARE STAPHYLOCOCCUS AUREUS CRITICAL RESULT CALLED TO, READ BACK BY AND VERIFIED WITH: RN Kerby Moors  [phone] FCP NO ANAEROBES ISOLATED Performed at [facility], [address], [place], [place] [address]    [facility] STAPHYLOCOCCUS AUREUS 07/15/2020      Lab Results  Component Value Date   ALBUMIN 2.5 (L) 07/13/2020   ALBUMIN 2.9 (L) 07/12/2020   ALBUMIN 3.1 (L) 07/11/2020   ALBUMIN 3.2 (L) 07/11/2020    Lab Results  Component Value Date   MG 2.0 07/11/2020   No results found for: VD25OH  No results found for: PREALBUMIN CBC EXTENDED Latest Ref Rng & Units 07/18/2020 07/15/2020 07/13/2020  WBC 4.0 - 10.5 K/uL 8.6 6.7 7.8  RBC 3.87 - 5.11 MIL/uL 2.49(L) 2.65(L) 2.66(L)  HGB 12.0 - 15.0 g/dL 8.3(L) 9.3(L) 9.1(L)  HCT 36.0 - 46.0 % 26.7(L) 28.3(L) 29.7(L)  PLT 150 - 400 K/uL 412(H) 389 405(H)  NEUTROABS 1.7 - 7.7 K/uL - 4.7 -  LYMPHSABS 0.7 - 4.0 K/uL - 1.3 -     There is no height or weight on file to calculate BMI.  Orders:  No orders of the defined types were placed in this encounter.  No orders of the defined types were placed in this encounter.    Procedures: No procedures performed  Clinical Data: No additional findings.  ROS:  All other systems negative, except as noted in the HPI. Review of Systems  Objective: Vital Signs: There were no vitals taken for this visit.  Specialty Comments:  No specialty comments available.  PMFS History: Patient Active  Problem List   Diagnosis Date Noted  . Acute hematogenous osteomyelitis of left tibia (HCC) 08/04/2020  . Arthralgia of left lower leg   . Chronic osteomyelitis of left tibia with draining sinus (HCC)   . Cellulitis of left leg   . Severe protein-calorie malnutrition (HCC)   . Hypothyroidism   . GERD (gastroesophageal reflux disease)   . Hepatic steatosis   . Thrombocytosis   . Macrocytic anemia   . Back pain 03/24/2019  . Infertility, female 03/24/2019  . PCOS (polycystic ovarian syndrome) 03/24/2019  . Human immunodeficiency virus (HIV) disease (HCC) 04/24/2018  . Health care maintenance 04/24/2018  . Hyperlipidemia 04/10/2017  . Community acquired pneumonia of right upper lobe of lung 04/16/2016  . Acute respiratory failure with hypoxia  (HCC) 04/16/2016  . Acute bronchitis 04/16/2016  . Hypertension 04/16/2016  . Class 3 obesity 03/21/2016  . Screening examination for venereal disease 03/30/2014  . Encounter for long-term (current) use of medications 03/30/2014  . Idiopathic thrombocytopenic purpura (ITP) (HCC) 11/14/2010  . ADHD (attention deficit hyperactivity disorder) 11/14/2010   Past Medical History:  Diagnosis Date  . ADHD   . Anxiety   . Asthma    usually with colds  . Avascular necrosis (HCC)   . Depression   . Elevated LFTs   . Fatty liver 04/16/2016   Noted [place] ABD  . GERD (gastroesophageal reflux disease)   . Heart murmur    at birth, no issues  . History of heat stroke    with syncope  . History of thrombocytopenia    prior to diagnosis HIV  . HIV (human immunodeficiency virus infection) (HCC)   . Hyperlipidemia   . Hypertension   . Hypothyroidism   . Obese   . PCOS (polycystic ovarian syndrome)   . Pneumonia 04/17/2016  . Wears partial dentures    upper  . Wrist fracture 11/2017   Left    Family History  Problem Relation Age of Onset  . Hypothyroidism Mother   . Hypertension Father     Past Surgical History:  Procedure Laterality Date  . HYSTEROSCOPY N/A 12/31/2017   Procedure: HYSTEROSCOPY and polypectomy;  Surgeon: Fermin Schwab, MD;  Location: [facility];  Service: Gynecology;  Laterality: N/A;  . I & D EXTREMITY Left 07/15/2020   Procedure: PARTIAL EXCISION LEFT TIBIA;  Surgeon: [name], MD;  [facility];  Service: Orthopedics;  Laterality: Left;  . IR RADIOLOGIST EVAL & MGMT  08/12/2020  . maxillofacial surgery     Social History   Occupational History  . Not on file  Tobacco Use  . Smoking status: Former Smoker    Packs/day: 0.30    Years: 15.00    Pack years: 4.50    Types: Cigarettes  . Smokeless tobacco: Never Used  Vaping Use  . Vaping Use: Never used  Substance and Sexual Activity  . Alcohol use: Yes    Alcohol/week: 7.0 standard  drinks    Types: 7 Glasses of wine per week    Comment: wine  2-3 times a week  . Drug use: Not Currently    Types: "Crack" cocaine    Comment: Previous hx of crack use - clean for 7 years   . Sexual activity: Not Currently    Partners: Male    Birth control/protection: None    Comment: try to conceive

## 2020-08-25 ENCOUNTER — Ambulatory Visit: Payer: 59 | Admitting: Infectious Diseases

## 2020-08-30 ENCOUNTER — Other Ambulatory Visit: Payer: Self-pay

## 2020-08-30 ENCOUNTER — Ambulatory Visit: Payer: 59 | Admitting: Infectious Diseases

## 2020-08-30 DIAGNOSIS — Z113 Encounter for screening for infections with a predominantly sexual mode of transmission: Secondary | ICD-10-CM | POA: Diagnosis not present

## 2020-08-30 DIAGNOSIS — Z79899 Other long term (current) drug therapy: Secondary | ICD-10-CM

## 2020-08-30 DIAGNOSIS — M86062 Acute hematogenous osteomyelitis, left tibia and fibula: Secondary | ICD-10-CM

## 2020-08-30 DIAGNOSIS — F1190 Opioid use, unspecified, uncomplicated: Secondary | ICD-10-CM

## 2020-08-30 DIAGNOSIS — B20 Human immunodeficiency virus [HIV] disease: Secondary | ICD-10-CM | POA: Diagnosis not present

## 2020-08-30 NOTE — Assessment & Plan Note (Signed)
Appreciate pain mgmt f/u [place] PMP checked.

## 2020-08-30 NOTE — Assessment & Plan Note (Signed)
She is doing well on dovoto Will check her labs today Married.

## 2020-08-30 NOTE — Progress Notes (Signed)
Subjective:    Patient ID: Lindsay French, female    DOB: 1980-10-20, 40 y.o.   MRN: [id number]  HPI 40yo F with HIV+ (dx 2008) prev on atripla -->truvada/prezcobix --->complera-->odefsy (due to lipodystrophy)--> dovato. She has had LLE pain since November.  I got a my chart message from [name] this week that she had AVN (MRI [place]) but also had erythema over her L shin. She has been seen by [facility] and [facility].  I started her on keflex. 07-05-20 She has significant pain. Taking Morphine tid and oxycodone tid.  MRIat [id number] signal abnormality in the proximal tibia and distal fibular (partially imaged). Findings are similar nonspecific but most consistent with severe avascular necrosis. However, given degree of surrounding edema and periosteal reaction, unable to exclude infection or marrow infiltrative process (such as leukemia/lymphoma). Recommend correlation with laboratory data and any systemic sources predisposing patient to AVN (such as sickle cell disease). Depending on these findings, recommend short interval follow-up MRI which also includes the femur.  I saw her in ID f/u on 3-17- I sent her for urgent MRI (see results) due to worsening pain and swelling.  She continued on keflex with no improvement.  She states she had low grade f/c at home.  I reviewed her MRI on 3-18 and called her to come to hospital on 3-19 twice. She called me back on 3-21 and I encouraged her to come to the hospital due to the fluid collection on her MRI.  She was adm to hospital 3-21, her anbx were stopped, and on 3-25 underwent I & D, partial excision L tibia and abscess. Her Cx grew MSSA She was d/c home on 3-28 with plan for IV ancef via PIC until 5-7. She returned to ED on 4-6 due to [facility] malfunction, she had cathflow 4-7 but did not use. She states that since she had 2 mL NS flush, it resolved.  She was planned to complete the anbx on 08-27-20.  She needs her PIC removed today.   She had lab work done via home health on 08-29-20.   ESR 128 (07-18-20) CRP 3.9   Her provider is changing her pain rx (to dilaudid) I reviewed her PMP data- she has had 2 different providers write pain rx Leonides Sake and [facility]) and 3 different pharmacies.    HIV 1 RNA Quant  Date Value  03/10/2020 <20 Copies/mL  03/04/2019 38 copies/mL (H)  04/24/2018 25 copies/mL (H)   CD4 T Cell Abs (/uL)  Date Value  03/10/2020 545  03/04/2019 773  04/24/2018 640    She still has some d/c from her L leg wound. She is "supposed to have [name] compression socks" but has misplaced. Also, "they're really itchy". She stitches are out.  Was told she is on her feet too much.   Review of Systems  Constitutional: Negative for chills and fever.  Respiratory: Negative for cough and shortness of breath.   Gastrointestinal: Negative for constipation and diarrhea.  Genitourinary: Negative for difficulty urinating.  Musculoskeletal: Positive for arthralgias and myalgias.  Skin: Positive for wound.       Objective:   Physical Exam Vitals reviewed.  Constitutional:      Appearance: Normal appearance. She is obese.  HENT:     Mouth/Throat:     Mouth: Mucous membranes are moist.     Pharynx: No oropharyngeal exudate.  Eyes:     Extraocular Movements: Extraocular movements intact.     Pupils: Pupils are equal, round,  and reactive to light.  Cardiovascular:     Rate and Rhythm: Normal rate and regular rhythm.  Pulmonary:     Effort: Pulmonary effort is normal.     Breath sounds: Normal breath sounds.  Abdominal:     General: Bowel sounds are normal. There is no distension.     Palpations: Abdomen is soft.     Tenderness: There is no abdominal tenderness.  Musculoskeletal:     Cervical back: Normal range of motion and neck supple.     Right lower leg: No edema.     Left lower leg: Edema present.  Neurological:     General: No focal deficit present.     Mental Status: She is alert.   Psychiatric:        Mood and Affect: Mood normal.   her PIC is clean, intact. The dressing has come undone.    There is not d/c from her wound. She has a small amount of clear d/c prior to exam on removing dressing.         Assessment & Plan:

## 2020-08-30 NOTE — Assessment & Plan Note (Addendum)
She is on keflex now I instructed her to continue on this at least until she has f/u in 1 month.  Will follow her CRP and ESR as indicators to stop anbx (she complains of continued pain).  If no change in inflammatory markers or continued pain, MRI at next visit.  pic out today

## 2020-08-30 NOTE — Progress Notes (Signed)
Per verbal order from Dr. Ninetta Lights, 39 cm PICC removed from right basilic, tip intact. No sutures present. RN confirmed length per chart. Dressing was clean and dry. Petroleum dressing applied. Patient advised no heavy lifting with this arm and to leave dressing in place for 24 hours and not to shower affected arm for 24 hours. Advised patient to call office or seek emergency medical care if dressing becomes soaked with blood, swelling, or sharp pain presents. Advised patient to seek emergent care if develops neurological symptoms, chest pain, or shortness of breath. Patient verbalized understanding and agreement. RN answered patient's questions. Patient tolerated procedure well and RN walked patient to check out. RN notified Toni Amend at [employer] of removal.   Sandie Ano, RN

## 2020-08-30 NOTE — Addendum Note (Signed)
Addended by: Harley Alto on: 08/30/2020 04:45 PM   Modules accepted: Orders

## 2020-08-31 LAB — T-HELPER CELL (CD4) - (RCID CLINIC ONLY)
CD4 % Helper T Cell: 39 % (ref 33–65)
CD4 T Cell Abs: 664 /uL (ref 400–1790)

## 2020-09-01 ENCOUNTER — Ambulatory Visit: Payer: 59 | Admitting: Orthopedic Surgery

## 2020-09-02 LAB — LIPID PANEL
Cholesterol: 203 mg/dL — ABNORMAL HIGH (ref <200)
HDL: 51 mg/dL (ref >=50)
LDL Cholesterol (Calc): 127 mg/dL (calc) — ABNORMAL HIGH
Non-HDL Cholesterol (Calc): 152 mg/dL (calc) — ABNORMAL HIGH (ref <130)
Total CHOL/HDL Ratio: 4 (calc) (ref <5.0)
Triglycerides: 141 mg/dL (ref <150)

## 2020-09-02 LAB — HIV-1 RNA QUANT-NO REFLEX-BLD
HIV 1 RNA Quant: <20 Copies/mL — ABNORMAL HIGH
HIV-1 RNA Quant, Log: <1.3 Log cps/mL — ABNORMAL HIGH

## 2020-09-02 LAB — COMPREHENSIVE METABOLIC PANEL
AG Ratio: 1.1 (calc) (ref 1.0–2.5)
ALT: 8 U/L (ref 6–29)
AST: 22 U/L (ref 10–30)
Albumin: 3.9 g/dL (ref 3.6–5.1)
Alkaline phosphatase (APISO): 103 U/L (ref 31–125)
BUN: 8 mg/dL (ref 7–25)
CO2: 27 mmol/L (ref 20–32)
Calcium: 9.1 mg/dL (ref 8.6–10.2)
Chloride: 99 mmol/L (ref 98–110)
Creat: 0.59 mg/dL (ref 0.50–1.10)
Globulin: 3.5 g/dL (calc) (ref 1.9–3.7)
Glucose, Bld: 99 mg/dL (ref 65–99)
Potassium: 4.3 mmol/L (ref 3.5–5.3)
Sodium: 134 mmol/L — ABNORMAL LOW (ref 135–146)
Total Bilirubin: 0.2 mg/dL (ref 0.2–1.2)
Total Protein: 7.4 g/dL (ref 6.1–8.1)

## 2020-09-02 LAB — CBC
HCT: 31.2 % — ABNORMAL LOW (ref 35.0–45.0)
Hemoglobin: 9.9 g/dL — ABNORMAL LOW (ref 11.7–15.5)
MCH: 28 pg (ref 27.0–33.0)
MCHC: 31.7 g/dL — ABNORMAL LOW (ref 32.0–36.0)
MCV: 88.4 fL (ref 80.0–100.0)
MPV: 10.9 fL (ref 7.5–12.5)
Platelets: 327 10*3/uL (ref 140–400)
RBC: 3.53 10*6/uL — ABNORMAL LOW (ref 3.80–5.10)
RDW: 15.4 % — ABNORMAL HIGH (ref 11.0–15.0)
WBC: 10.8 10*3/uL (ref 3.8–10.8)

## 2020-09-02 LAB — SEDIMENTATION RATE: Sed Rate: 58 mm/h — ABNORMAL HIGH (ref 0–20)

## 2020-09-02 LAB — C-REACTIVE PROTEIN: CRP: 13.7 mg/L — ABNORMAL HIGH (ref <8.0)

## 2020-09-02 LAB — RPR: RPR Ser Ql: NONREACTIVE

## 2020-09-06 ENCOUNTER — Other Ambulatory Visit: Payer: Self-pay | Admitting: *Deleted

## 2020-09-06 DIAGNOSIS — M86062 Acute hematogenous osteomyelitis, left tibia and fibula: Secondary | ICD-10-CM

## 2020-09-06 MED ORDER — CEPHALEXIN 500 MG PO CAPS: 500.0000 mg | ORAL_CAPSULE | Freq: Four times a day (QID) | ORAL | 0 refills | Status: DC

## 2020-09-06 NOTE — Progress Notes (Signed)
Patient requesting refills of oral antibiotics per Dr Moshe Cipro last office note. RN sent 30 day continuation of last antibiotic, Keflex 500 mg 1 capsule by mouth four times daily as advised by Dr Lennox Laity, Zachary George, RN

## 2020-09-08 ENCOUNTER — Encounter: Payer: Self-pay | Admitting: Infectious Diseases

## 2020-09-08 ENCOUNTER — Other Ambulatory Visit: Payer: Self-pay

## 2020-09-08 ENCOUNTER — Ambulatory Visit: Payer: 59 | Admitting: Infectious Diseases

## 2020-09-08 ENCOUNTER — Telehealth: Payer: Self-pay

## 2020-09-08 DIAGNOSIS — M86062 Acute hematogenous osteomyelitis, left tibia and fibula: Secondary | ICD-10-CM | POA: Diagnosis not present

## 2020-09-08 MED ORDER — MUPIROCIN CALCIUM 2 % EX CREA: 1.0000 "application " | TOPICAL_CREAM | Freq: Two times a day (BID) | CUTANEOUS | 1 refills | Status: DC

## 2020-09-08 MED ORDER — CEPHALEXIN 500 MG PO CAPS: 500.0000 mg | ORAL_CAPSULE | Freq: Four times a day (QID) | ORAL | 0 refills | Status: DC

## 2020-09-08 NOTE — Telephone Encounter (Signed)
Patient's pharmacy called requesting that the patient's Bactroban cream be changed to a ointment for insurance coverage purposes.

## 2020-09-08 NOTE — Progress Notes (Signed)
Subjective:    Patient ID: Lindsay French, female  DOB: 04/27/1980, 40 y.o.        MRN: [id number]   HPI 40yo F with HIV+ (dx 2008) prev on atripla -->truvada/prezcobix --->complera-->odefsy (due to lipodystrophy)--> dovato. She has had LLE pain since November.  I got a my chart message from Ms Brunelli this week that she had AVN (MRI [place]) but also had erythema over her L shin. She has been seen by [facility] and [facility].  I started her on keflex. 07-05-20 She has significant pain. Taking Morphine tid and oxycodone tid.  MRIat [id number] signal abnormality in the proximal tibia and distal fibular (partially imaged). Findings are similar nonspecific but most consistent with severe avascular necrosis. However, given degree of surrounding edema and periosteal reaction, unable to exclude infection or marrow infiltrative process (such as leukemia/lymphoma). Recommend correlation with laboratory data and any systemic sources predisposing patient to AVN (such as sickle cell disease). Depending on these findings, recommend short interval follow-up MRI which also includes the femur.  I saw her in ID f/u on 3-17- I sent her for urgent MRI (see results) due to worsening pain and swelling.  She continued on keflex with no improvement.  She states she had low grade f/c at home.  I reviewed her MRI on 3-18 and called her to come to hospital on 3-19 twice. She called me back on 3-21 and I encouraged her to come to the hospital due to the fluid collection on her MRI. She was adm to hospital 3-21, her anbx were stopped, and on 3-25 underwent I &D, partial excision L tibia and abscess. Her Cx grew MSSA She was d/c home on 3-28 with plan for IV ancef via PIC until 5-7. She returned to ED on 4-6 due to [facility] malfunction, she had cathflow 4-7 but did not use. She states that since she had 2 mL NS flush, it resolved.  She was planned to complete the anbx on 08-27-20.  She needs her PIC removed  today.  She had lab work done via home health on 08-29-20.   She was seen in ID clinic 5-10 and PIC was removed. She was transitioned to keflex.  Today returns with worsening wound d/c. Worsened pain, into her arch and her medial knee.  Clear, yellow-green tinted fluid.  No f/c.  Taking diluadid 34m q4h prn. Worse at night.  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   ESR 128 (07-18-20) CRP 3.9  HIV 1 RNA Quant  Date Value  08/30/2020 <20 Copies/mL (H)  03/10/2020 <20 Copies/mL  03/04/2019 38 copies/mL (H)   CD4 T Cell Abs (/uL)  Date Value  08/30/2020 664  03/10/2020 545  03/04/2019 773     Health Maintenance  Topic Date Due  . TETANUS/TDAP  Never done  . PAP SMEAR-Modifier  12/21/2014  . COVID-19 Vaccine (3 - Moderna risk 4-dose series) 12/19/2019  . INFLUENZA VACCINE  11/21/2020  . Hepatitis C Screening  Completed  . HIV Screening  Completed  . HPV VACCINES  Aged Out      Review of Systems  Constitutional: Negative for chills and fever.  Musculoskeletal: Positive for joint pain and myalgias.    Please see HPI. All other systems reviewed and negative.     Objective:  Physical Exam Constitutional:      General: She is not in acute distress.    Appearance: She is  obese. She is not ill-appearing, toxic-appearing or diaphoretic.  Musculoskeletal:       Legs:  Neurological:     Mental Status: She is alert.            Assessment & Plan:

## 2020-09-08 NOTE — Assessment & Plan Note (Signed)
She has had f/u with Dr Lajoyce Corners. Will rx her mupirocin topical, continue kelfex,  Will recheck MRI of her LLE, esp with continued pain despite dilaudid.  rtc in 1 week.

## 2020-09-14 NOTE — Telephone Encounter (Signed)
texted with pt today. she is having continued ain. I have asked her to call Dr Lajoyce Corners +/o go to the ED.

## 2020-09-15 ENCOUNTER — Encounter: Payer: Self-pay | Admitting: Infectious Diseases

## 2020-09-15 ENCOUNTER — Ambulatory Visit: Payer: 59 | Admitting: Infectious Diseases

## 2020-09-15 ENCOUNTER — Other Ambulatory Visit: Payer: Self-pay | Admitting: Infectious Diseases

## 2020-09-15 ENCOUNTER — Other Ambulatory Visit: Payer: Self-pay

## 2020-09-15 DIAGNOSIS — M86062 Acute hematogenous osteomyelitis, left tibia and fibula: Secondary | ICD-10-CM

## 2020-09-15 NOTE — Progress Notes (Signed)
Subjective:    Patient ID: Lindsay French, female  DOB: 30-Jan-1981, 40 y.o.        MRN: [id number]   HPI 40yo F with HIV+ (dx 2008) prev on atripla -->truvada/prezcobix --->complera-->odefsy(due to lipodystrophy)--> dovato. She has had LLE pain since November.  I got a my chart message from [name] this week that she had AVN (MRI [place]) but also had erythema over her L shin. She has been seen by [facility] and [facility].  I started her on keflex. 07-05-20 She has significant pain. Taking Morphine tid and oxycodone tid.  MRIat [id number] signal abnormality in the proximal tibia and distal fibular (partially imaged). Findings are similar nonspecific but most consistent with severe avascular necrosis. However, given degree of surrounding edema and periosteal reaction, unable to exclude infection or marrow infiltrative process (such as leukemia/lymphoma). Recommend correlation with laboratory data and any systemic sources predisposing patient to AVN (such as sickle cell disease). Depending on these findings, recommend short interval follow-up MRI which also includes the femur.  I saw her in ID f/u on 3-17- I sent her for urgent MRI (see results) due to worsening pain and swelling.  She continued on keflex with no improvement.  She states she had low grade f/c at home.  I reviewed her MRI on 3-18 and called her to come to hospital on 3-19 twice. She called me back on 3-21 and I encouraged her to come to the hospital due to the fluid collection on her MRI. She was adm to hospital 39,   her anbx were stopped, and on 3-25 underwent I &D, partial excision L tibia and abscess. Her Cx grew MSSA She was d/c home on 3-28 with plan for IV ancef via PIC until 5-7. She returned to ED on 4-6 due to [facility] malfunction, she had cathflow 4-7 but did not use. She states that since she had 2 mL NS flush, it resolved. She was planned to complete the anbx on 08-27-20.  She needs her PIC removed  today.  She had lab work done via home health on 08-29-20.  She was seen in ID clinic 5-10 and PIC was removed. She was transitioned to keflex 568m qid.  Returned (5-19) with worsening wound d/c. Worsened pain, into her arch and her medial knee.  Clear, yellow-green tinted fluid.  No f/c.  Taking diluadid 473mq4h prn. Worse at night.  In addition to her keflex she had mupirocin oint added.   Since her last visit she has had continued pain. She is sched for MRI June 4. She has had continued drainage. Now feels like her medial knee is tender, stiff. She is limping. She cannot bear full wt.  She had a fever 2 days ago when she had gi sx.  She missed her f/u with Dr DuSharol Givenue to anxiety and forgetting appt.   ESR 128 (07-18-20) 58 (08-30-20) CRP 3.9  13.7 HIV 1 RNA Quant  Date Value  08/30/2020 <20 Copies/mL (H)  03/10/2020 <20 Copies/mL  03/04/2019 38 copies/mL (H)   CD4 T Cell Abs (/uL)  Date Value  08/30/2020 664  03/10/2020 545  03/04/2019 773     Health Maintenance  Topic Date Due  . TETANUS/TDAP  Never done  . PAP SMEAR-Modifier  12/21/2014  . COVID-19 Vaccine (3 - Moderna risk 4-dose series) 12/19/2019  . INFLUENZA VACCINE  11/21/2020  . Zoster Vaccines- Shingrix (1 of 2) 08/18/2031  . Hepatitis C Screening  Completed  . HIV Screening  Completed  .  HPV VACCINES  Aged Out      ROS  Please see HPI. All other systems reviewed and negative.     Objective:  Physical Exam Vitals reviewed.  Constitutional:      Appearance: She is obese.  Skin:      Neurological:     Mental Status: She is alert.             Assessment & Plan:

## 2020-09-15 NOTE — Assessment & Plan Note (Addendum)
She appears unchanged objectively.  We discussed (and agreed) that she does not currently need admission.  Will work on getting her in with Dr Sharol Given and move up her MRI.  Will repeat her ESR and CRP.  Will see her back after MRI.  If her pain is worse, I've advised her to go to ED.

## 2020-09-17 ENCOUNTER — Other Ambulatory Visit: Payer: 59

## 2020-09-17 ENCOUNTER — Ambulatory Visit
Admission: RE | Admit: 2020-09-17 | Discharge: 2020-09-17 | Disposition: A | Discharge status: Home / Self Care | Payer: 59 | Source: Ambulatory Visit | Attending: Infectious Diseases | Admitting: Infectious Diseases

## 2020-09-17 DIAGNOSIS — M86062 Acute hematogenous osteomyelitis, left tibia and fibula: Secondary | ICD-10-CM

## 2020-09-17 IMAGING — MR MR [PERSON_NAME] LOW WO/W CM*L*
5 of 9 series · 25 of 40 positions shown · IV contrast (Multihance 20ml)
Comparison: None.

CLINICAL DATA: Osteomyelitis of the left tibia.

EXAM:
MRI OF LOWER LEFT EXTREMITY WITHOUT AND WITH CONTRAST
TECHNIQUE: Multiplanar, multisequence MR imaging of the left lower leg was
performed both before and after administration of intravenous
contrast.
CONTRAST:  20mL MULTIHANCE GADOBENATE DIMEGLUMINE 529 MG/ML IV SOLN

[Series 6: T1 · coronal · right · 4.0mm · 1.50mm/px · 4 of 34 slices shown (1 of 2)]
[im 1/34]
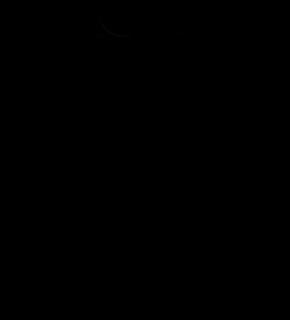
[im 12/34]
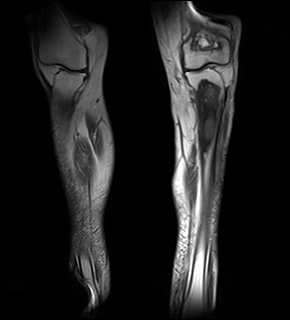
[im 23/34]
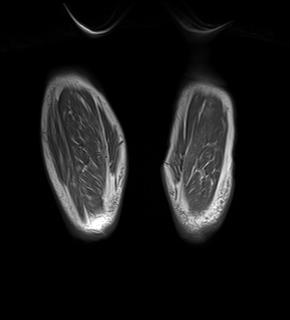
[im 34/34]
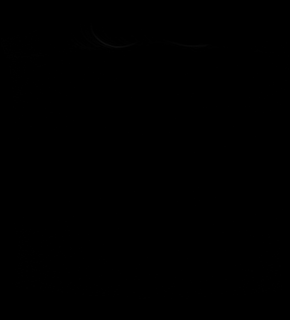

[Series 8: T1 · axial · right · 4.0mm · 0.43mm/px · z∈[-173,+183]mm · 6 of 73 slices shown (2 of 2)]
[im 1/73]
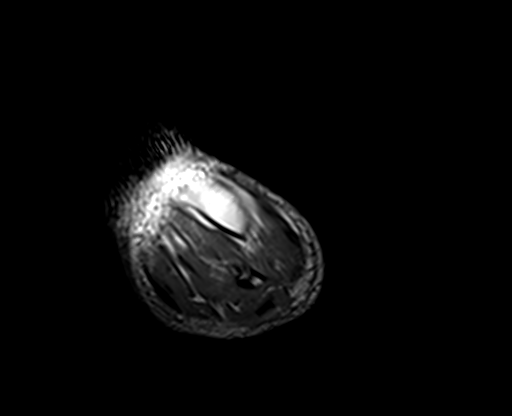
[im 15/73]
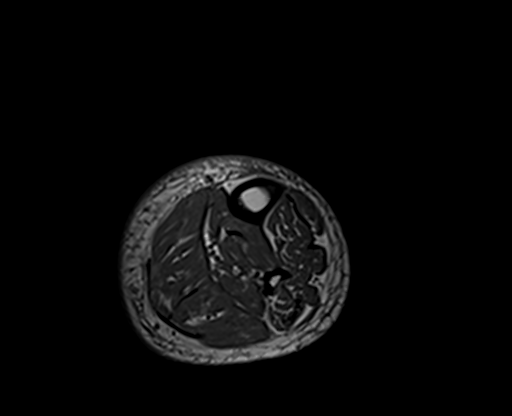
[im 29/73]
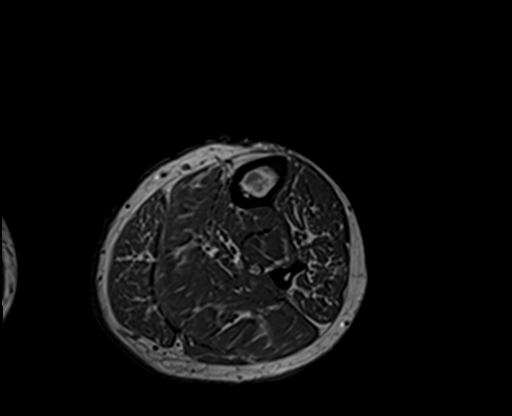
[im 44/73]
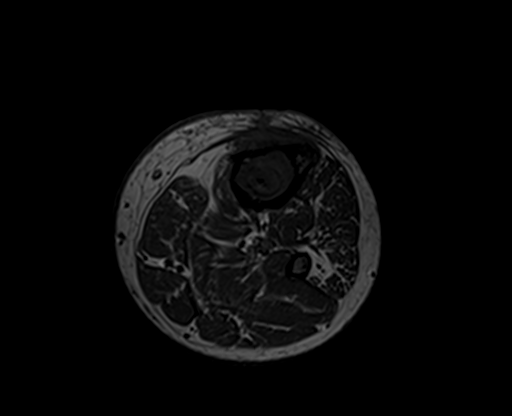
[im 58/73]
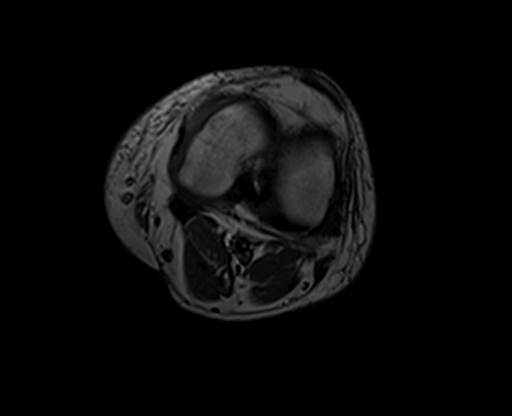
[im 73/73]
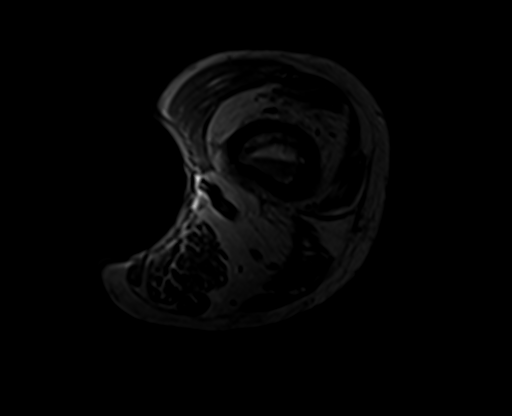

[Series 9: T2 fat-sat · axial · right · 4.0mm · 0.43mm/px · z∈[-173,+183]mm · 6 of 73 slices shown]
[im 1/73]
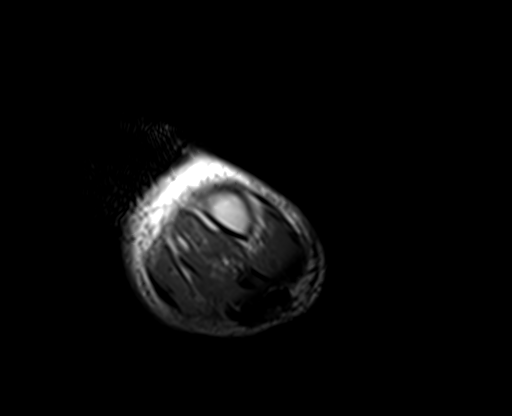
[im 15/73]
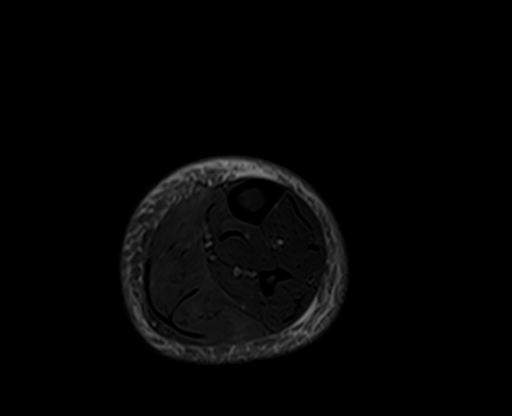
[im 29/73]
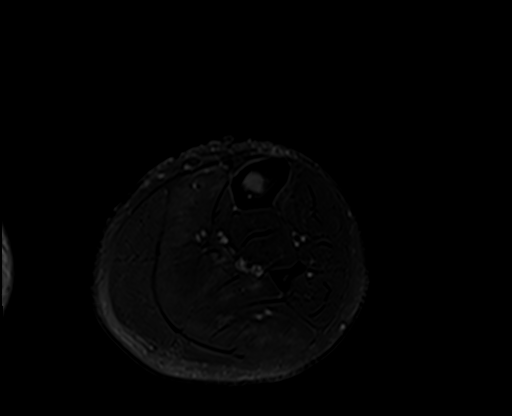
[im 44/73]
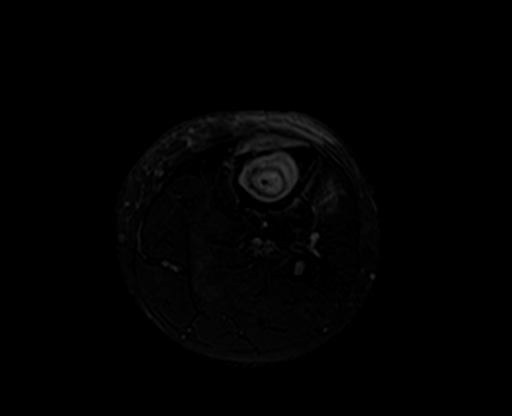
[im 58/73]
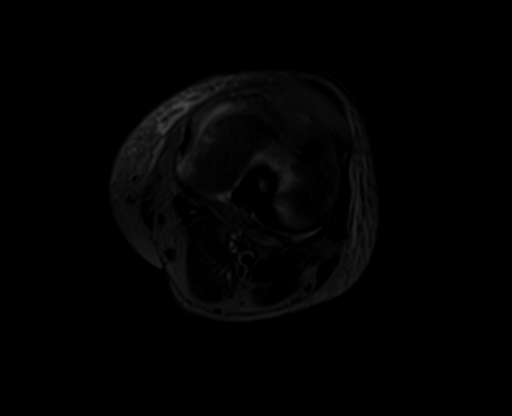
[im 73/73]
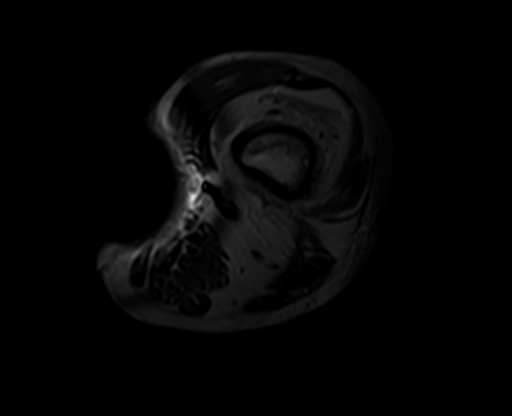

[Series 11: T1 fat-sat · axial · right · 4.0mm · 0.43mm/px · z∈[-173,+183]mm · 6 of 73 slices shown]
[im 1/73]
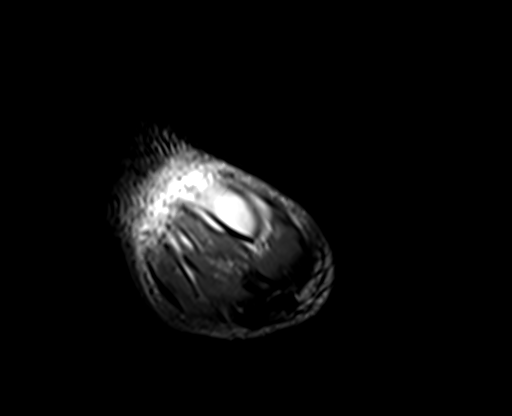
[im 15/73]
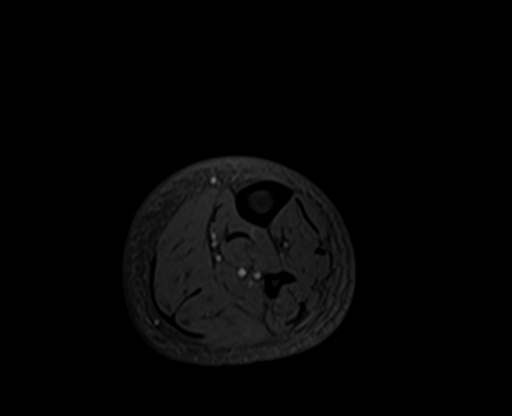
[im 29/73]
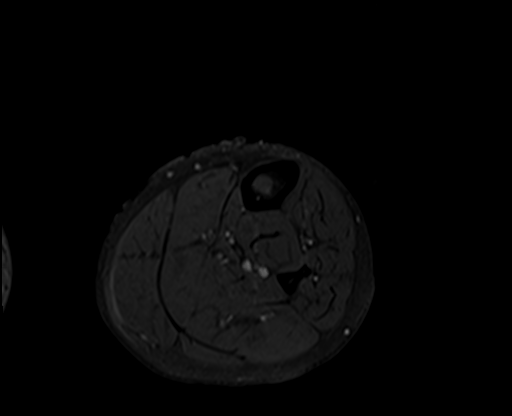
[im 44/73]
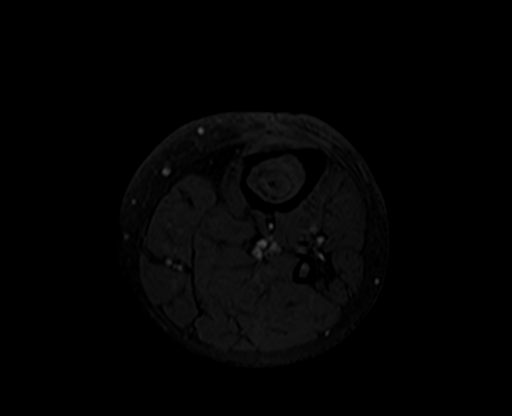
[im 58/73]
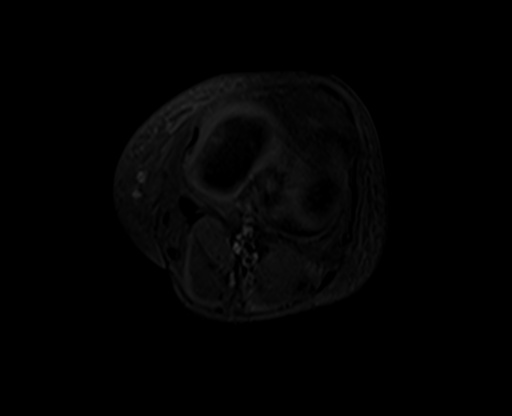
[im 73/73]
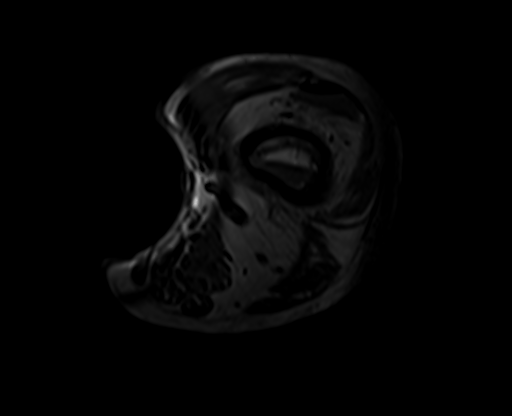

[Series 12: T1 fat-sat post-contrast · axial · right · 4.0mm · 0.43mm/px · z∈[-173,-34]mm · 3 of 73 slices shown]
[im 1/73]
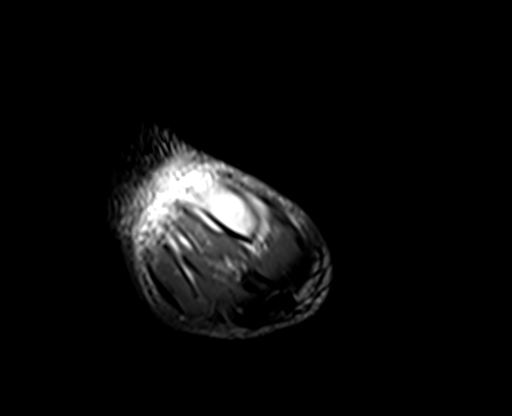
[im 15/73]
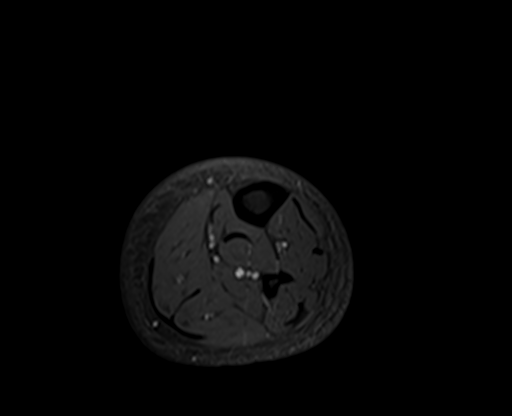
[im 29/73]
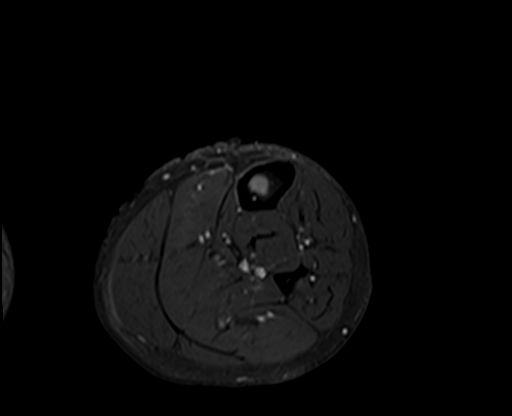

[25 of 40 positions shown; findings below may reference images not displayed]

FINDINGS: Bones/Joint/Cartilage

Serpiginous signal abnormality in the distal left femoral
diametaphysis consistent with a bone infarct with surrounding bone
marrow edema and enhancement which may reflect osteomyelitis versus
reactive edema.

Serpiginous signal abnormality in the proximal right tibial
metadiaphysis consistent with a bone infarct.

Evidence of prior bone infarct in the left proximal tibia. Severe
superimposed bone marrow edema in the proximal tibial metaphysis and
diaphysis with interval removal of the methylmethacrylate antibiotic
beads and cortical thickening and signal abnormality. Cortical
resection of the anterior cortex of the proximal tibial diaphysis.
Sinus track extending from the medullary cavity to the skin surface.
Significant thick enhancement within the periphery of the medullary
cavity with central lack of enhancement which may reflect prior
debridement versus necrosis.

No fracture or dislocation. Normal alignment. Mild left knee
synovial enhancement which may be reactive.

Ligaments

Collateral ligaments are intact.

Muscles and Tendons
Muscles are normal.

Soft tissue
No fluid collection or hematoma. No soft tissue mass. Soft tissue
edema along the anterior aspect of the proximal lower leg.
IMPRESSION: 1. Severe bone marrow edema and intramedullary enhancement involving
the proximal left tibial metaphysis and diaphysis with interval
removal of the methylmethacrylate antibiotic beads. Central area of
lack of enhancement likely reflecting necrosis versus surgical
debris. Overall appearance is most consistent with osteomyelitis and
postsurgical changes with a sinus track extending from the medullary
cavity to the skin surface through a anterior cortical surgical
defect.
2. Serpiginous signal abnormality in the distal left femoral
diametaphysis consistent with a bone infarct with surrounding bone
marrow edema and enhancement which may reflect osteomyelitis versus
reactive edema.

## 2020-09-17 MED ORDER — GADOBENATE DIMEGLUMINE 529 MG/ML IV SOLN
20.0000 mL | Freq: Once | INTRAVENOUS | Status: AC | PRN
  Administered 2020-09-17: 20 mL via INTRAVENOUS

## 2020-09-20 ENCOUNTER — Ambulatory Visit (INDEPENDENT_AMBULATORY_CARE_PROVIDER_SITE_OTHER): Payer: 59 | Admitting: Orthopedic Surgery

## 2020-09-20 ENCOUNTER — Ambulatory Visit: Payer: 59 | Admitting: Infectious Diseases

## 2020-09-20 ENCOUNTER — Telehealth: Payer: Self-pay | Admitting: *Deleted

## 2020-09-20 ENCOUNTER — Encounter: Payer: Self-pay | Admitting: Infectious Diseases

## 2020-09-20 ENCOUNTER — Other Ambulatory Visit: Payer: Self-pay

## 2020-09-20 DIAGNOSIS — M86462 Chronic osteomyelitis with draining sinus, left tibia and fibula: Secondary | ICD-10-CM

## 2020-09-20 DIAGNOSIS — M86062 Acute hematogenous osteomyelitis, left tibia and fibula: Secondary | ICD-10-CM

## 2020-09-20 DIAGNOSIS — F909 Attention-deficit hyperactivity disorder, unspecified type: Secondary | ICD-10-CM

## 2020-09-20 DIAGNOSIS — M25562 Pain in left knee: Secondary | ICD-10-CM

## 2020-09-20 DIAGNOSIS — M79605 Pain in left leg: Secondary | ICD-10-CM

## 2020-09-20 MED ORDER — COLCHICINE 0.6 MG PO CAPS: 0.6000 mg | ORAL_CAPSULE | Freq: Two times a day (BID) | ORAL | 1 refills | Status: DC | PRN

## 2020-09-20 MED ORDER — ALLOPURINOL 100 MG PO TABS: 100.0000 mg | ORAL_TABLET | Freq: Two times a day (BID) | ORAL | 3 refills | Status: DC

## 2020-09-20 NOTE — Progress Notes (Signed)
Subjective:    Patient ID: Lindsay French, female  DOB: 07-04-1980, 40 y.o.        MRN: [id number]   HPI 40yo F with HIV+ (dx 2008) prev on atripla -->truvada/prezcobix --->complera-->odefsy(due to lipodystrophy)--> dovato. She has had LLE pain since November.  I got a my chart message from [name] this week that she had AVN (MRI [place]) but also had erythema over her L shin. She has been seen by [facility] and [facility].  I started her on keflex. 07-05-20 She has significant pain. Taking Morphine tid and oxycodone tid.  MRIat [id number] signal abnormality in the proximal tibia and distal fibular (partially imaged). Findings are similar nonspecific but most consistent with severe avascular necrosis. However, given degree of surrounding edema and periosteal reaction, unable to exclude infection or marrow infiltrative process (such as leukemia/lymphoma). Recommend correlation with laboratory data and any systemic sources predisposing patient to AVN (such as sickle cell disease). Depending on these findings, recommend short interval follow-up MRI which also includes the femur.  I saw her in ID f/u on 3-17- I sent her for urgent MRI (see results) due to worsening pain and swelling.  She continued on keflex with no improvement.  She states she had low grade f/c at home.  I reviewed her MRI on 3-18 and called her to come to hospital on 3-19 twice. She called me back on 3-21 and I encouraged her to come to the hospital due to the fluid collection on her MRI. She was adm to hospital 3-21, her anbx were stopped, and on 3-25 underwent I &D, partial excision L tibia and abscess. Her Cx grew MSSA She was d/c home on 3-28 with plan for IV ancef via PIC until 5-7. She returned to ED on 4-6 due to [facility] malfunction, she had cathflow 4-7 but did not use. She states that since she had 2 mL NS flush, it resolved. She was planned to complete the anbx on 08-27-20.   She was seen in ID clinic  5-10 and PIC was removed. She was transitioned to keflex 550m qid.  Returned (5-19) with worsening wound d/c. Worsened pain, into her arch and her medial knee.  Clear, yellow-green tinted fluid.  No f/c.  Taking diluadid 471mq4h prn. Worse at night.  In addition to her keflex she had mupirocin oint added.   She was seen 09-15-20 with continued drainage,  medial knee is tender, stiff, limping. She cannot bear full wt.  She missed her f/u with Dr DuSharol Givenue to anxiety and forgetting appt.   She was sched for MRI which she had on 09-18-20:  1. Severe bone marrow edema and intramedullary enhancement involving the proximal left tibial metaphysis and diaphysis with interval removal of the methylmethacrylate antibiotic beads. Central area of lack of enhancement likely reflecting necrosis versus surgical debris. Overall appearance is most consistent with osteomyelitis and postsurgical changes with a sinus track extending from the medullary cavity to the skin surface through a anterior cortical surgical defect. 2. Serpiginous signal abnormality in the distal left femoral diametaphysis consistent with a bone infarct with surrounding bone marrow edema and enhancement which may reflect osteomyelitis versus reactive edema.  She was seen by ortho today, had fluid expressed. Told that her wound is "more healed" than expected. She had blood drawn as well.  She had fever last week, none since.  Has had minimal drainage.   ESR 128 (07-18-20)      58 (08-30-20) CRP 3.9  13.7  HIV 1 RNA Quant  Date Value  08/30/2020 <20 Copies/mL (H)  03/10/2020 <20 Copies/mL  03/04/2019 38 copies/mL (H)   CD4 T Cell Abs (/uL)  Date Value  08/30/2020 664  03/10/2020 545  03/04/2019 773     Health Maintenance  Topic Date Due  . TETANUS/TDAP  Never done  . PAP SMEAR-Modifier  12/21/2014  . COVID-19 Vaccine (3 - Moderna risk 4-dose series) 12/19/2019  . INFLUENZA VACCINE  11/21/2020  .  Zoster Vaccines- Shingrix (1 of 2) 08/18/2031  . Hepatitis C Screening  Completed  . HIV Screening  Completed  . HPV VACCINES  Aged Out      Review of Systems  Constitutional: Negative for chills and fever.  Gastrointestinal: Negative for constipation and diarrhea.  Genitourinary: Negative for dysuria.  Musculoskeletal: Positive for joint pain and myalgias.    Please see HPI. All other systems reviewed and negative.     Objective:  Physical Exam Vitals reviewed.  Constitutional:      Appearance: Normal appearance. She is obese.  Skin:      Neurological:     Mental Status: She is alert.            Assessment & Plan:

## 2020-09-20 NOTE — Progress Notes (Signed)
Office Visit Note   Patient: Lindsay French           Date of Birth: 06/24/1980           MRN: [id number] Visit Date: 09/20/2020              Requested by: Ginnie Smart, MD [address] [place],  [place] [address] PCP: Adrienne Mocha, PA  Chief Complaint  Patient presents with  . Left Leg - Follow-up    Hx 06/25/20 partial excision left tibia  09/17/20 MRI LLE ordered by Dr. Ninetta Lights       HPI: Patient is a 40 year old woman who presents 2 months status post partial excision left tibia secondary to chronic osteomyelitis.  Patient is followed by Dr. Ninetta Lights and infectious disease an MRI scan was obtained on 09/17/2020.  Patient complains of global pain in her left knee she is currently on Keflex 4 times a day.States that her pain in the left knee is not relieved by narcotics.  Assessment & Plan: Visit Diagnoses:  1. Pain in left leg   2. Chronic osteomyelitis of left tibia with draining sinus (HCC)     Plan: We will obtain a uric acid patient's exquisite pain in her knee seems most consistent with gout we we will give her a prescription for colchicine and allopurinol follow-up next week.  Follow-Up Instructions: Return in about 1 week (around 09/27/2020).   Ortho Exam  Patient is alert, oriented, no adenopathy, well-dressed, normal affect, normal respiratory effort. Examination patient has exquisite pain to light touch of her left knee there is no cellulitis there is a mild effusion no warmth no redness no clinical signs of infection.  Her surgical incision from the chronic osteomyelitis of the tibia is well-healed there is no cellulitis no open wound no drainage.  Review the MRI scan shows mild cysts synovial enhancement of the knee no clinical signs of intra-articular pathology or infection of the knee.  Imaging: No results found. No images are attached to the encounter.  Labs: Lab Results  Component Value Date   ESRSEDRATE 58 (H) 08/30/2020   ESRSEDRATE 128  (H) 07/18/2020   CRP 13.7 (H) 08/30/2020   CRP 3.9 (H) 07/18/2020   LABURIC 6.2 09/20/2020   REPTSTATUS 07/20/2020 FINAL 07/15/2020   GRAMSTAIN  07/15/2020    ABUNDANT WBC PRESENT, PREDOMINANTLY PMN NO ORGANISMS SEEN    CULT  07/15/2020    RARE STAPHYLOCOCCUS AUREUS CRITICAL RESULT CALLED TO, READ BACK BY AND VERIFIED WITH: RN Kerby Moors  [phone] [id number] FCP NO ANAEROBES ISOLATED Performed at [facility], [address], [place], [place] [address]    [facility] STAPHYLOCOCCUS AUREUS 07/15/2020     Lab Results  Component Value Date   ALBUMIN 2.5 (L) 07/13/2020   ALBUMIN 2.9 (L) 07/12/2020   ALBUMIN 3.1 (L) 07/11/2020   ALBUMIN 3.2 (L) 07/11/2020    Lab Results  Component Value Date   MG 2.0 07/11/2020   No results found for: VD25OH  No results found for: PREALBUMIN CBC EXTENDED Latest Ref Rng & Units 08/30/2020 07/18/2020 07/15/2020  WBC 3.8 - 10.8 Thousand/uL 10.8 8.6 6.7  RBC 3.80 - 5.10 Million/uL 3.53(L) 2.49(L) 2.65(L)  HGB 11.7 - 15.5 g/dL 9.8(Y) 8.3(L) 9.3(L)  HCT 35.0 - 45.0 % 31.2(L) 26.7(L) 28.3(L)  PLT 140 - 400 Thousand/uL 327 412(H) 389  NEUTROABS 1.7 - 7.7 K/uL - - 4.7  LYMPHSABS 0.7 - 4.0 K/uL - - 1.3  There is no height or weight on file to calculate BMI.  Orders:  Orders Placed This Encounter  Procedures  . Uric acid   Meds ordered this encounter  Medications  . Colchicine 0.6 MG CAPS    Sig: Take 0.6 mg by mouth 2 (two) times daily as needed.    Dispense:  60 capsule    Refill:  1  . allopurinol (ZYLOPRIM) 100 MG tablet    Sig: Take 1 tablet (100 mg total) by mouth 2 (two) times daily.    Dispense:  60 tablet    Refill:  3     Procedures: No procedures performed  Clinical Data: No additional findings.  ROS:  All other systems negative, except as noted in the HPI. Review of Systems  Objective: Vital Signs: There were no vitals taken for this visit.  Specialty Comments:  No specialty comments available.  PMFS  History: Patient Active Problem List   Diagnosis Date Noted  . Chronic narcotic use 08/30/2020  . Acute hematogenous osteomyelitis of left tibia (HCC) 08/04/2020  . Arthralgia of left lower leg   . Chronic osteomyelitis of left tibia with draining sinus (HCC)   . Cellulitis of left leg   . Severe protein-calorie malnutrition (HCC)   . Hypothyroidism   . GERD (gastroesophageal reflux disease)   . Hepatic steatosis   . Thrombocytosis   . Macrocytic anemia   . Back pain 03/24/2019  . Infertility, female 03/24/2019  . PCOS (polycystic ovarian syndrome) 03/24/2019  . Human immunodeficiency virus (HIV) disease (HCC) 04/24/2018  . Health care maintenance 04/24/2018  . Hyperlipidemia 04/10/2017  . Community acquired pneumonia of right upper lobe of lung 04/16/2016  . Acute respiratory failure with hypoxia (HCC) 04/16/2016  . Acute bronchitis 04/16/2016  . Hypertension 04/16/2016  . Class 3 obesity 03/21/2016  . Screening examination for venereal disease 03/30/2014  . Encounter for long-term (current) use of medications 03/30/2014  . Idiopathic thrombocytopenic purpura (ITP) (HCC) 11/14/2010  . ADHD (attention deficit hyperactivity disorder) 11/14/2010   Past Medical History:  Diagnosis Date  . ADHD   . Anxiety   . Asthma    usually with colds  . Avascular necrosis (HCC)   . Depression   . Elevated LFTs   . Fatty liver 04/16/2016   Noted [place] ABD  . GERD (gastroesophageal reflux disease)   . Heart murmur    at birth, no issues  . History of heat stroke    with syncope  . History of thrombocytopenia    prior to diagnosis HIV  . HIV (human immunodeficiency virus infection) (HCC)   . Hyperlipidemia   . Hypertension   . Hypothyroidism   . Obese   . PCOS (polycystic ovarian syndrome)   . Pneumonia 04/17/2016  . Wears partial dentures    upper  . Wrist fracture 11/2017   Left    Family History  Problem Relation Age of Onset  . Hypothyroidism Mother   . Hypertension  Father     Past Surgical History:  Procedure Laterality Date  . HYSTEROSCOPY N/A 12/31/2017   Procedure: HYSTEROSCOPY and polypectomy;  Surgeon: Fermin Schwab, MD;  Location: [facility];  Service: Gynecology;  Laterality: N/A;  . I & D EXTREMITY Left 07/15/2020   Procedure: PARTIAL EXCISION LEFT TIBIA;  Surgeon: [name], MD;  Location[facility];  Service: Orthopedics;  Laterality: Left;  . IR RADIOLOGIST EVAL & MGMT  08/12/2020  . maxillofacial surgery     Social History  Occupational History  . Not on file  Tobacco Use  . Smoking status: Former Smoker    Packs/day: 0.30    Years: 15.00    Pack years: 4.50    Types: Cigarettes  . Smokeless tobacco: Never Used  Vaping Use  . Vaping Use: Never used  Substance and Sexual Activity  . Alcohol use: Yes    Alcohol/week: 7.0 standard drinks    Types: 7 Glasses of wine per week    Comment: wine  2-3 times a week  . Drug use: Not Currently    Types: "Crack" cocaine    Comment: Previous hx of crack use - clean for 7 years   . Sexual activity: Not Currently    Partners: Male    Birth control/protection: None    Comment: try to conceive

## 2020-09-20 NOTE — Telephone Encounter (Signed)
Faxed referral to [facility] for IV antibiotics - ancef 2 gm q 8 hours x 6 weeks per Dr Ninetta Lights. PICC placement pending. Andree Coss, RN

## 2020-09-20 NOTE — Assessment & Plan Note (Signed)
Will restart her IV ancef for 6 weeks. Await Dr Audrie Lia notes.  Labs when [facility] started, IV anbx.  rtc in 3-4 weeks.

## 2020-09-20 NOTE — Assessment & Plan Note (Signed)
Very distressed that she is going back on IV anbx. Is interested in short term disability, which I encouraged.

## 2020-09-21 LAB — URIC ACID: Uric Acid, Serum: 6.2 mg/dL (ref 2.5–7.0)

## 2020-09-21 NOTE — Telephone Encounter (Signed)
Relayed PICC appointment time to Tim at [facility] and that no need for short stay as patient has been treated with cefazolin before.   Sandie Ano, RN

## 2020-09-22 ENCOUNTER — Telehealth: Payer: Self-pay

## 2020-09-22 NOTE — Telephone Encounter (Signed)
Spoke with patient to relay PICC appointment time. She would like Dr. Ninetta Lights to know that in order to qualify for disability, he would have to deem her unable to work for one year. She states she would like to go back to work 3-6 months after she completes antibiotics, but in order for the claim to be processed the designated timeline would have specify one year.    Advised patient RN would let Dr. Ninetta Lights know and to please fax any forms that need to be completed to our office. Patient verbalized understanding and has no further questions.   Sandie Ano, RN

## 2020-09-22 NOTE — Telephone Encounter (Signed)
Relayed PICC appointment time and location to patient, she is agreeable.   Sandie Ano, RN

## 2020-09-24 ENCOUNTER — Other Ambulatory Visit: Payer: 59

## 2020-09-26 ENCOUNTER — Encounter: Payer: Self-pay | Admitting: Orthopedic Surgery

## 2020-09-26 ENCOUNTER — Ambulatory Visit (HOSPITAL_COMMUNITY)
Admission: RE | Admit: 2020-09-26 | Discharge: 2020-09-26 | Disposition: A | Discharge status: Home / Self Care | Payer: 59 | Source: Ambulatory Visit | Attending: Infectious Diseases | Admitting: Infectious Diseases

## 2020-09-26 ENCOUNTER — Other Ambulatory Visit: Payer: Self-pay

## 2020-09-26 DIAGNOSIS — M86062 Acute hematogenous osteomyelitis, left tibia and fibula: Secondary | ICD-10-CM | POA: Insufficient documentation

## 2020-09-26 HISTORY — PX: IR FLUORO GUIDE CV LINE LEFT: IMG2282

## 2020-09-26 IMAGING — XA IR FLUORO GUIDE CV LINE*L*
2 series · 2 of 2 positions shown · IV contrast (agent unspecified)
Comparison: none

INDICATION: Need for central venous access for long-term IV antibiotics.

EXAM:
ULTRASOUND AND FLUOROSCOPIC GUIDED PICC LINE INSERTION
MEDICATIONS:
1% lidocaine 2 mL
CONTRAST:  None
FLUOROSCOPY TIME:  6 seconds (0 mGy)
COMPLICATIONS:
None immediate.
TECHNIQUE: The procedure, risks, benefits, and alternatives were explained to
the patient and informed written consent was obtained.

[Series 1: ir fluoro guide cv line*left* · 1 of 1 slices shown]
[im 1/1]
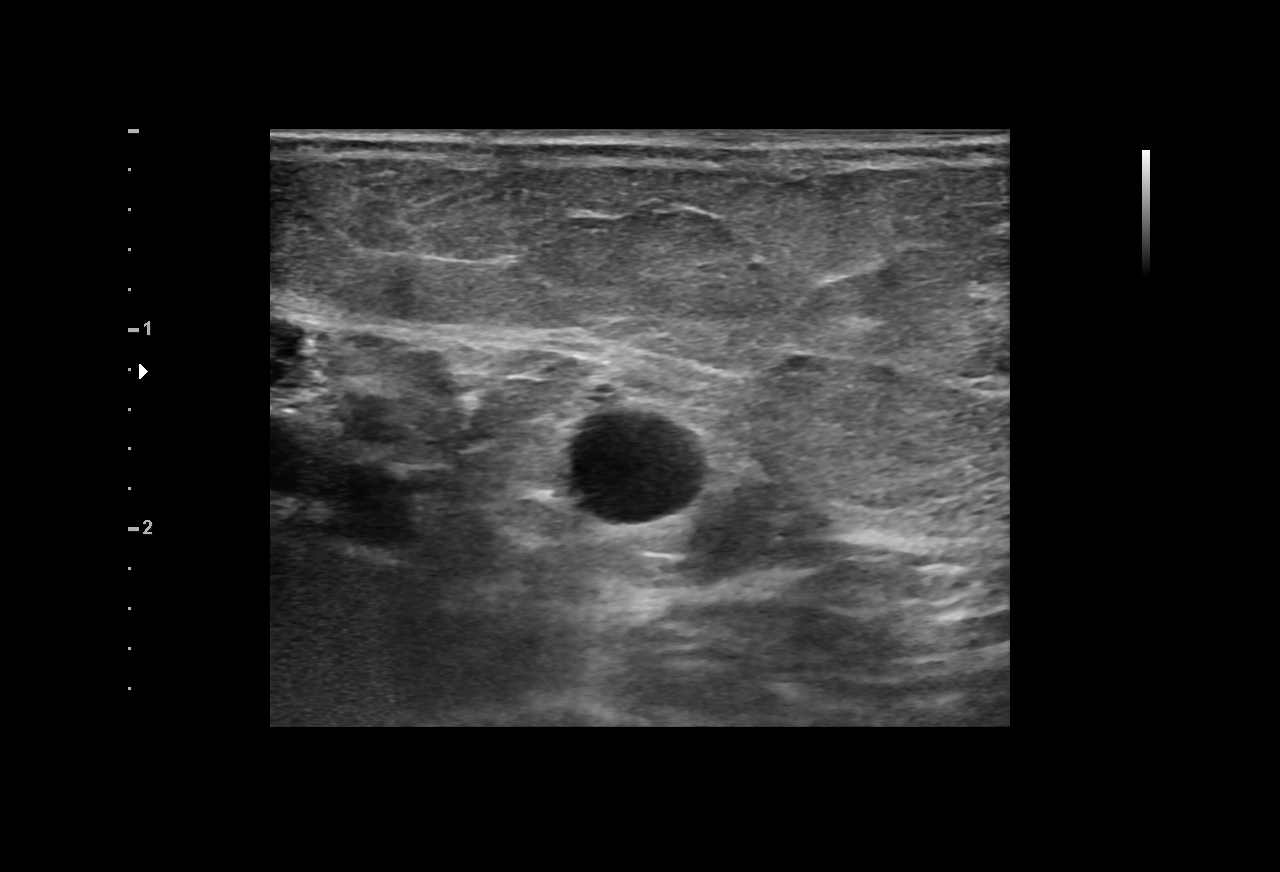

[Series 1: fl (-) angio · 1 of 1 slices shown]
[im 1/1]
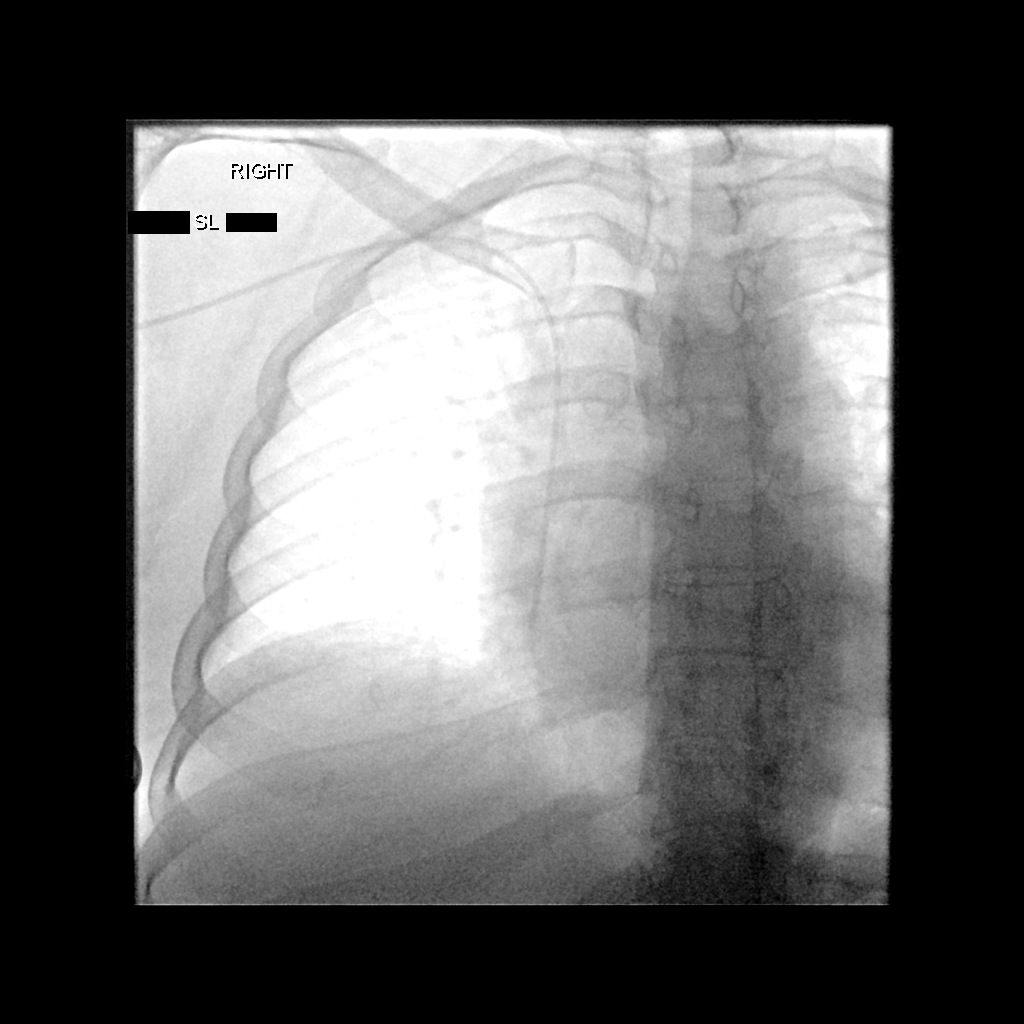

[2 of 2 positions shown; findings below may reference images not displayed]

The right upper extremity was prepped with chlorhexidine in a
sterile fashion, and a sterile drape was applied covering the
operative field. Maximum barrier sterile technique with sterile
gowns and gloves were used for the procedure. A timeout was
performed prior to the initiation of the procedure. Local anesthesia
was provided with 1% lidocaine.

After the overlying soft tissues were anesthetized with 1%
lidocaine, a micropuncture kit was utilized to access the right
basilic vein. Real-time ultrasound guidance was utilized for
vascular access including the acquisition of a permanent ultrasound
image documenting patency of the accessed vessel.

A guidewire was advanced to the level of the superior caval-atrial
junction for measurement purposes and the PICC line was cut to
length. A peel-away sheath was placed and a 40 cm, 5 French, single
lumen was inserted to level of the superior caval-atrial junction. A
post procedure spot fluoroscopic was obtained. The catheter easily
aspirated and flushed and was secured in place. A dressing was
placed. The patient tolerated the procedure well without immediate
post procedural complication.
FINDINGS: After catheter placement, the tip lies within the superior
cavoatrial junction. The catheter aspirates and flushes normally and
is ready for immediate use.
IMPRESSION: Successful ultrasound and fluoroscopic guided placement of a right
basilic vein approach, 40 cm, 5 French, single lumen PICC with tip
at the superior caval-atrial junction. The PICC line is ready for
immediate use.

## 2020-09-26 MED ORDER — LIDOCAINE HCL (PF) 1 % IJ SOLN
INTRAMUSCULAR | Status: AC
  Filled 2020-09-26: qty 30

## 2020-09-26 MED ORDER — LIDOCAINE HCL (PF) 1 % IJ SOLN
INTRAMUSCULAR | Status: DC | PRN
  Administered 2020-09-26: 10 mL

## 2020-09-27 ENCOUNTER — Encounter: Payer: Self-pay | Admitting: Orthopedic Surgery

## 2020-09-27 ENCOUNTER — Ambulatory Visit (INDEPENDENT_AMBULATORY_CARE_PROVIDER_SITE_OTHER): Payer: 59 | Admitting: Orthopedic Surgery

## 2020-09-27 DIAGNOSIS — M1A0620 Idiopathic chronic gout, left knee, without tophus (tophi): Secondary | ICD-10-CM

## 2020-09-27 DIAGNOSIS — M86462 Chronic osteomyelitis with draining sinus, left tibia and fibula: Secondary | ICD-10-CM

## 2020-09-27 NOTE — Progress Notes (Signed)
Office Visit Note   Patient: Lindsay French           Date of Birth: 08-04-1980           MRN: [id number] Visit Date: 09/27/2020              Requested by: Adrienne Mocha, PA [address]  [place],  [place] [address] PCP: Adrienne Mocha, PA  Chief Complaint  Patient presents with  . Left Leg - Follow-up      HPI: Patient is a 40 year old woman who is over 3 months out from partial excision of the tibia from chronic osteomyelitis of her tibia she had placement of antibiotic beads.  Patient had some difficulty with acute knee pain as well as increased drainage MRI scan did show edema in the tibia and she has been appropriately placed back on IV antibiotics.  Patient states that the knee pain has significantly improved she feels like this may be secondary to her not being as active.  She has been taking the colchicine.  Patient's father has a history of gout.  Assessment & Plan: Visit Diagnoses:  1. Chronic osteomyelitis of left tibia with draining sinus (HCC)   2. Idiopathic chronic gout of left knee without tophus     Plan: Patient will stop the colchicine and start taking the allopurinol 1 a day.  Will obtain a uric acid at follow-up.  I think patient did have a component of gout secondary to the trauma from surgery.  With patient's surgical intervention and prolonged IV antibiotics I anticipate patient will not be able to resume work for at least 12 months.  I recommend patient apply for Social Security disability due to the prolonged anticipated rehab process.  Follow-Up Instructions: Return in about 4 weeks (around 10/25/2020).   Ortho Exam  Patient is alert, oriented, no adenopathy, well-dressed, normal affect, normal respiratory effort. Examination the patient is wearing a compression sock the surgical incision continues to improve there is no drainage at this time no redness no cellulitis there is an area of granulation tissue where there was drainage.  Patient's knee  is asymptomatic at this time there is no tenderness to light touch that there was previously there is no redness no cellulitis no effusion.  Patient's uric acid after her acute knee pain was 6.2.  Imaging: No results found. No images are attached to the encounter.  Labs: Lab Results  Component Value Date   ESRSEDRATE 58 (H) 08/30/2020   ESRSEDRATE 128 (H) 07/18/2020   CRP 13.7 (H) 08/30/2020   CRP 3.9 (H) 07/18/2020   LABURIC 6.2 09/20/2020   REPTSTATUS 07/20/2020 FINAL 07/15/2020   GRAMSTAIN  07/15/2020    ABUNDANT WBC PRESENT, PREDOMINANTLY PMN NO ORGANISMS SEEN    CULT  07/15/2020    RARE STAPHYLOCOCCUS AUREUS CRITICAL RESULT CALLED TO, READ BACK BY AND VERIFIED WITH: RN Kerby Moors  [phone] [id number] FCP NO ANAEROBES ISOLATED Performed at [facility], [address] [place], [place] [address]    [facility] STAPHYLOCOCCUS AUREUS 07/15/2020     Lab Results  Component Value Date   ALBUMIN 2.5 (L) 07/13/2020   ALBUMIN 2.9 (L) 07/12/2020   ALBUMIN 3.1 (L) 07/11/2020   ALBUMIN 3.2 (L) 07/11/2020    Lab Results  Component Value Date   MG 2.0 07/11/2020   No results found for: VD25OH  No results found for: PREALBUMIN CBC EXTENDED Latest Ref Rng & Units 08/30/2020 07/18/2020 07/15/2020  WBC 3.8 -  10.8 Thousand/uL 10.8 8.6 6.7  RBC 3.80 - 5.10 Million/uL 3.53(L) 2.49(L) 2.65(L)  HGB 11.7 - 15.5 g/dL 2.3(N) 8.3(L) 9.3(L)  HCT 35.0 - 45.0 % 31.2(L) 26.7(L) 28.3(L)  PLT 140 - 400 Thousand/uL 327 412(H) 389  NEUTROABS 1.7 - 7.7 K/uL - - 4.7  LYMPHSABS 0.7 - 4.0 K/uL - - 1.3     There is no height or weight on file to calculate BMI.  Orders:  No orders of the defined types were placed in this encounter.  No orders of the defined types were placed in this encounter.    Procedures: No procedures performed  Clinical Data: No additional findings.  ROS:  All other systems negative, except as noted in the HPI. Review of Systems  Objective: Vital Signs: There were  no vitals taken for this visit.  Specialty Comments:  No specialty comments available.  PMFS History: Patient Active Problem List   Diagnosis Date Noted  . Chronic narcotic use 08/30/2020  . Acute hematogenous osteomyelitis of left tibia (HCC) 08/04/2020  . Arthralgia of left lower leg   . Chronic osteomyelitis of left tibia with draining sinus (HCC)   . Cellulitis of left leg   . Severe protein-calorie malnutrition (HCC)   . Hypothyroidism   . GERD (gastroesophageal reflux disease)   . Hepatic steatosis   . Thrombocytosis   . Macrocytic anemia   . Back pain 03/24/2019  . Infertility, female 03/24/2019  . PCOS (polycystic ovarian syndrome) 03/24/2019  . Human immunodeficiency virus (HIV) disease (HCC) 04/24/2018  . Health care maintenance 04/24/2018  . Hyperlipidemia 04/10/2017  . Community acquired pneumonia of right upper lobe of lung 04/16/2016  . Acute respiratory failure with hypoxia (HCC) 04/16/2016  . Acute bronchitis 04/16/2016  . Hypertension 04/16/2016  . Class 3 obesity 03/21/2016  . Screening examination for venereal disease 03/30/2014  . Encounter for long-term (current) use of medications 03/30/2014  . Idiopathic thrombocytopenic purpura (ITP) (HCC) 11/14/2010  . ADHD (attention deficit hyperactivity disorder) 11/14/2010   Past Medical History:  Diagnosis Date  . ADHD   . Anxiety   . Asthma    usually with colds  . Avascular necrosis (HCC)   . Depression   . Elevated LFTs   . Fatty liver 04/16/2016   Noted [place] ABD  . GERD (gastroesophageal reflux disease)   . Heart murmur    at birth, no issues  . History of heat stroke    with syncope  . History of thrombocytopenia    prior to diagnosis HIV  . HIV (human immunodeficiency virus infection) (HCC)   . Hyperlipidemia   . Hypertension   . Hypothyroidism   . Obese   . PCOS (polycystic ovarian syndrome)   . Pneumonia 04/17/2016  . Wears partial dentures    upper  . Wrist fracture 11/2017   Left     Family History  Problem Relation Age of Onset  . Hypothyroidism Mother   . Hypertension Father     Past Surgical History:  Procedure Laterality Date  . HYSTEROSCOPY N/A 12/31/2017   Procedure: HYSTEROSCOPY and polypectomy;  Surgeon: Fermin Schwab, MD;  Location: [facility];  Service: Gynecology;  Laterality: N/A;  . I & D EXTREMITY Left 07/15/2020   Procedure: PARTIAL EXCISION LEFT TIBIA;  Surgeon: [name], MD;  Location: [facility] OR;  Service: Orthopedics;  Laterality: Left;  . IR FLUORO GUIDE CV LINE LEFT  09/26/2020  . IR RADIOLOGIST EVAL & MGMT  08/12/2020  . maxillofacial  surgery     Social History   Occupational History  . Not on file  Tobacco Use  . Smoking status: Former Smoker    Packs/day: 0.30    Years: 15.00    Pack years: 4.50    Types: Cigarettes  . Smokeless tobacco: Never Used  Vaping Use  . Vaping Use: Never used  Substance and Sexual Activity  . Alcohol use: Yes    Alcohol/week: 7.0 standard drinks    Types: 7 Glasses of wine per week    Comment: wine  2-3 times a week  . Drug use: Not Currently    Types: "Crack" cocaine    Comment: Previous hx of crack use - clean for 7 years   . Sexual activity: Not Currently    Partners: Male    Birth control/protection: None    Comment: try to conceive

## 2020-09-29 ENCOUNTER — Other Ambulatory Visit: Payer: Self-pay | Admitting: Infectious Diseases

## 2020-09-29 ENCOUNTER — Ambulatory Visit: Payer: 59 | Admitting: Infectious Diseases

## 2020-09-29 ENCOUNTER — Telehealth: Payer: Self-pay | Admitting: Infectious Diseases

## 2020-09-29 NOTE — Telephone Encounter (Signed)
Pt texted that she is having difficulty with facial tingling as IV is infused.  Please discontinue ancef.  Please change her antibiotics to daptomycin to complete her anbx course.  Please check weekly CBC with diff, CMP, ESR, CRP, CK  She needs CK with initiation of daptomycin.  Rtc in 1-2 weeks

## 2020-09-30 ENCOUNTER — Telehealth: Payer: Self-pay | Admitting: Infectious Diseases

## 2020-09-30 NOTE — Telephone Encounter (Signed)
Pt texted that she is having difficulty with facial tingling as IV is infused. Please discontinue ancef. Please change her antibiotics to daptomycin to complete her anbx course. Please check weekly CBC with diff, CMP, ESR, CRP, CK   She needs CK with initiation of daptomycin. Rtc in 1-2 weeks

## 2020-09-30 NOTE — Telephone Encounter (Signed)
Verbal orders given to [facility] with [facility] to discontinue Ancef and start dapto. Spoke with Cassie, pharmacist to confirm dosage needs. Ok for 8mg /kg daily. Lab orders given as well. Patient is currently using [facility] so first dose can be done in the home vs outpatient.   Lindsay French , RN

## 2020-10-03 NOTE — Telephone Encounter (Signed)
As much as I don't like vancomycin for MSSA... it may be a cheap IV option for her in this case.  Could also consider a long acting antibiotic like dalbavancin at short stay (or home health via peripheral IV, [facility] can do this)- if so, would do 1500 mg load the 1000 mg every 2 weeks x 3 doses (6 weeks total).

## 2020-10-04 NOTE — Telephone Encounter (Signed)
Jeani Hawking returned call and stated Dalvance will cost patient $2500 as she has not met her out of pocket max yet. Forwarding to pharmacy team and provider for next steps.   Sharese Manrique Lorita Officer, RN

## 2020-10-04 NOTE — Telephone Encounter (Signed)
I think we might be able to get her copay assistance. Let me look into this.  Dr. Ninetta Lights - are you ok with Dalvance if we can get it covered?

## 2020-10-04 NOTE — Telephone Encounter (Signed)
I have spoke with Larita Fife with [facility] and he stated the dalbavancin can be done at home for the patient through home health. Larita Fife will check pricing on the dalbavancin and give our office a call back. I have also given Larita Fife the doses and he verbalized understanding.

## 2020-10-06 NOTE — Telephone Encounter (Signed)
We are still waiting to see about assistance. I'm hoping to hear something today. Will let you know ASAP.

## 2020-10-06 NOTE — Telephone Encounter (Signed)
Hey Cassie,  Were you able to get patient assistance for this patient or switching to vanc

## 2020-10-07 ENCOUNTER — Other Ambulatory Visit (HOSPITAL_COMMUNITY): Payer: Self-pay

## 2020-10-07 NOTE — Telephone Encounter (Signed)
Lindsay French with [facility] called in regards to pricing on the Nafcillin. Patient copay is going to be $90.10. Patient has an high deductible and right now insurance is only pay 50%. Patient will need to pay at least 8,000 more dollars out of pocket to meet deductible.  Lindsay French [name]

## 2020-10-07 NOTE — Telephone Encounter (Signed)
Unfortunately, the max assistance we can get for Dalvance is $2000. Patient would still have several thousands cost for the infusions.   'm also worried about doing vancomycin as her BMI is quite high and she would need large doses to get adequate levels. Vancomycin is also not ideal choice for MSSA (worse outcomes than beta lactams).  Discussed with Lindsay French. Will do nafcillin continuous infusion. . Lindsay French - let's do continuous infusion Nafcillin 12 gm/24 hours. Weekly CBC with diff, CMET, ESR, CRP.

## 2020-10-07 NOTE — Telephone Encounter (Signed)
Patient called back and stated she has decided to go with the Dalvance. She would rather do the IV infusion once daily.  Mattingly Fountaine [name]

## 2020-10-07 NOTE — Telephone Encounter (Signed)
Patient would also like to know if she will have to do oral antibiotics after completion of the the IV antibiotics. Please advise

## 2020-10-07 NOTE — Telephone Encounter (Signed)
I am out of options for this patient.   Can we see about vanc? If affordable, then she needs 2500 mg IV x 1 load then 1000 mg IV q8h with CBC, ESR and CRP weekly and BMET and vanc trough twice weekly.

## 2020-10-07 NOTE — Telephone Encounter (Signed)
Can you sign dalvance orders to be faxed to [facility]?

## 2020-10-07 NOTE — Telephone Encounter (Signed)
I have provided Lindsay French with [facility] the dosing for vancomycin and she will call back with pricing for the vancomycin. Ledford Goodson [name]

## 2020-10-07 NOTE — Telephone Encounter (Signed)
I have spokw to [facility] with [facility] to get patient's copay for the Nafcillin. Corrie Dandy will call our office back with pricing. April Carlyon [name]

## 2020-10-07 NOTE — Telephone Encounter (Signed)
Received call back from [facility] with [facility] and vancomycin copay will be $51.00 a day for the patient. Patient has been advised the patient of the copay. Patient stated she still does not know if she will be able to afford this. She stated she would call [facility] to discuss payment options and she may call her insurance company as well.  Lindsay French [name]

## 2020-10-10 NOTE — Telephone Encounter (Signed)
Orders have been faxed to [facility].

## 2020-10-10 NOTE — Telephone Encounter (Signed)
Sure thing. Orders signed and given to Diminique to fax to [facility].

## 2020-10-12 ENCOUNTER — Other Ambulatory Visit: Payer: Self-pay

## 2020-10-12 ENCOUNTER — Encounter (HOSPITAL_COMMUNITY): Payer: Self-pay | Admitting: Internal Medicine

## 2020-10-12 ENCOUNTER — Observation Stay (HOSPITAL_COMMUNITY)
Admission: EM | Admit: 2020-10-12 | Discharge: 2020-10-13 | Disposition: A | Discharge status: Home / Self Care | Payer: 59 | Attending: Internal Medicine | Admitting: Internal Medicine

## 2020-10-12 ENCOUNTER — Emergency Department (HOSPITAL_COMMUNITY): Payer: 59

## 2020-10-12 DIAGNOSIS — J45909 Unspecified asthma, uncomplicated: Secondary | ICD-10-CM | POA: Diagnosis not present

## 2020-10-12 DIAGNOSIS — I10 Essential (primary) hypertension: Secondary | ICD-10-CM | POA: Diagnosis not present

## 2020-10-12 DIAGNOSIS — Z79899 Other long term (current) drug therapy: Secondary | ICD-10-CM | POA: Diagnosis not present

## 2020-10-12 DIAGNOSIS — Z87891 Personal history of nicotine dependence: Secondary | ICD-10-CM | POA: Insufficient documentation

## 2020-10-12 DIAGNOSIS — E039 Hypothyroidism, unspecified: Secondary | ICD-10-CM | POA: Insufficient documentation

## 2020-10-12 DIAGNOSIS — Z20822 Contact with and (suspected) exposure to covid-19: Secondary | ICD-10-CM | POA: Diagnosis not present

## 2020-10-12 DIAGNOSIS — E876 Hypokalemia: Secondary | ICD-10-CM | POA: Insufficient documentation

## 2020-10-12 DIAGNOSIS — R112 Nausea with vomiting, unspecified: Secondary | ICD-10-CM | POA: Diagnosis present

## 2020-10-12 DIAGNOSIS — F909 Attention-deficit hyperactivity disorder, unspecified type: Secondary | ICD-10-CM | POA: Diagnosis not present

## 2020-10-12 DIAGNOSIS — R197 Diarrhea, unspecified: Secondary | ICD-10-CM

## 2020-10-12 DIAGNOSIS — Z91040 Latex allergy status: Secondary | ICD-10-CM | POA: Insufficient documentation

## 2020-10-12 LAB — CBC
HCT: 35.1 % — ABNORMAL LOW (ref 36.0–46.0)
Hemoglobin: 10.5 g/dL — ABNORMAL LOW (ref 12.0–15.0)
MCH: 25.6 pg — ABNORMAL LOW (ref 26.0–34.0)
MCHC: 29.9 g/dL — ABNORMAL LOW (ref 30.0–36.0)
MCV: 85.6 fL (ref 80.0–100.0)
Platelets: 304 10*3/uL (ref 150–400)
RBC: 4.1 MIL/uL (ref 3.87–5.11)
RDW: 17 % — ABNORMAL HIGH (ref 11.5–15.5)
WBC: 10.3 10*3/uL (ref 4.0–10.5)
nRBC: 0 % (ref 0.0–0.2)

## 2020-10-12 LAB — COMPREHENSIVE METABOLIC PANEL
ALT: 69 U/L — ABNORMAL HIGH (ref 0–44)
AST: 69 U/L — ABNORMAL HIGH (ref 15–41)
Albumin: 4.3 g/dL (ref 3.5–5.0)
Alkaline Phosphatase: 119 U/L (ref 38–126)
Anion gap: 18 — ABNORMAL HIGH (ref 5–15)
BUN: 7 mg/dL (ref 6–20)
CO2: 19 mmol/L — ABNORMAL LOW (ref 22–32)
Calcium: 10.2 mg/dL (ref 8.9–10.3)
Chloride: 99 mmol/L (ref 98–111)
Creatinine, Ser: 0.63 mg/dL (ref 0.44–1.00)
GFR, Estimated: >60 mL/min (ref >60)
Glucose, Bld: 118 mg/dL — ABNORMAL HIGH (ref 70–99)
Potassium: 3 mmol/L — ABNORMAL LOW (ref 3.5–5.1)
Sodium: 136 mmol/L (ref 135–145)
Total Bilirubin: 0.6 mg/dL (ref 0.3–1.2)
Total Protein: 8.7 g/dL — ABNORMAL HIGH (ref 6.5–8.1)

## 2020-10-12 LAB — CBC WITH DIFFERENTIAL/PLATELET
Abs Immature Granulocytes: 0.05 10*3/uL (ref 0.00–0.07)
Basophils Absolute: 0 10*3/uL (ref 0.0–0.1)
Basophils Relative: 0 %
Eosinophils Absolute: 0 10*3/uL (ref 0.0–0.5)
Eosinophils Relative: 0 %
HCT: 37.5 % (ref 36.0–46.0)
Hemoglobin: 11.5 g/dL — ABNORMAL LOW (ref 12.0–15.0)
Immature Granulocytes: 0 %
Lymphocytes Relative: 10 %
Lymphs Abs: 1.2 10*3/uL (ref 0.7–4.0)
MCH: 25.8 pg — ABNORMAL LOW (ref 26.0–34.0)
MCHC: 30.7 g/dL (ref 30.0–36.0)
MCV: 84.3 fL (ref 80.0–100.0)
Monocytes Absolute: 1.3 10*3/uL — ABNORMAL HIGH (ref 0.1–1.0)
Monocytes Relative: 11 %
Neutro Abs: 9.4 10*3/uL — ABNORMAL HIGH (ref 1.7–7.7)
Neutrophils Relative %: 79 %
Platelets: 330 10*3/uL (ref 150–400)
RBC: 4.45 MIL/uL (ref 3.87–5.11)
RDW: 16.9 % — ABNORMAL HIGH (ref 11.5–15.5)
WBC: 12 10*3/uL — ABNORMAL HIGH (ref 4.0–10.5)
nRBC: 0 % (ref 0.0–0.2)

## 2020-10-12 LAB — CK: Total CK: 105 U/L (ref 38–234)

## 2020-10-12 LAB — C-REACTIVE PROTEIN: CRP: 0.6 mg/dL (ref <1.0)

## 2020-10-12 LAB — C DIFFICILE QUICK SCREEN W PCR REFLEX
C Diff antigen: NEGATIVE
C Diff interpretation: NOT DETECTED
C Diff toxin: NEGATIVE

## 2020-10-12 LAB — LIPASE, BLOOD: Lipase: 41 U/L (ref 11–51)

## 2020-10-12 LAB — SEDIMENTATION RATE: Sed Rate: 20 mm/hr (ref 0–22)

## 2020-10-12 LAB — MAGNESIUM: Magnesium: 1.6 mg/dL — ABNORMAL LOW (ref 1.7–2.4)

## 2020-10-12 LAB — LACTIC ACID, PLASMA
Lactic Acid, Venous: 4 mmol/L (ref 0.5–1.9)
Lactic Acid, Venous: 2.2 mmol/L (ref 0.5–1.9)

## 2020-10-12 LAB — RESP PANEL BY RT-PCR (FLU A&B, COVID) ARPGX2
Influenza A by PCR: NEGATIVE
Influenza B by PCR: NEGATIVE
SARS Coronavirus 2 by RT PCR: NEGATIVE

## 2020-10-12 LAB — CREATININE, SERUM
Creatinine, Ser: 0.61 mg/dL (ref 0.44–1.00)
GFR, Estimated: >60 mL/min (ref >60)

## 2020-10-12 MED ORDER — POTASSIUM CHLORIDE 10 MEQ/100ML IV SOLN
10.0000 meq | Freq: Once | INTRAVENOUS | Status: AC
  Administered 2020-10-12: 10 meq via INTRAVENOUS
  Filled 2020-10-12: qty 100

## 2020-10-12 MED ORDER — ONDANSETRON HCL 4 MG/2ML IJ SOLN
4.0000 mg | Freq: Once | INTRAMUSCULAR | Status: AC
  Administered 2020-10-12: 4 mg via INTRAVENOUS
  Filled 2020-10-12: qty 2

## 2020-10-12 MED ORDER — LORAZEPAM 2 MG/ML IJ SOLN
0.5000 mg | Freq: Once | INTRAMUSCULAR | Status: AC
  Administered 2020-10-12: 0.5 mg via INTRAVENOUS
  Filled 2020-10-12: qty 1

## 2020-10-12 MED ORDER — PROMETHAZINE HCL 25 MG PO TABS
25.0000 mg | ORAL_TABLET | Freq: Four times a day (QID) | ORAL | Status: DC | PRN
  Administered 2020-10-12: 25 mg via ORAL
  Filled 2020-10-12: qty 1

## 2020-10-12 MED ORDER — ACETAMINOPHEN 325 MG PO TABS: 650.0000 mg | ORAL_TABLET | Freq: Four times a day (QID) | ORAL | Status: DC | PRN

## 2020-10-12 MED ORDER — ENOXAPARIN SODIUM 40 MG/0.4ML IJ SOSY
40.0000 mg | PREFILLED_SYRINGE | INTRAMUSCULAR | Status: DC
  Filled 2020-10-12: qty 0.4

## 2020-10-12 MED ORDER — ACETAMINOPHEN 650 MG RE SUPP: 650.0000 mg | Freq: Four times a day (QID) | RECTAL | Status: DC | PRN

## 2020-10-12 MED ORDER — SODIUM CHLORIDE 0.9 % IV BOLUS
1000.0000 mL | Freq: Once | INTRAVENOUS | Status: AC
  Administered 2020-10-12: 1000 mL via INTRAVENOUS

## 2020-10-12 MED ORDER — POTASSIUM CHLORIDE 10 MEQ/100ML IV SOLN
10.0000 meq | INTRAVENOUS | Status: AC
  Administered 2020-10-12 (×3): 10 meq via INTRAVENOUS
  Filled 2020-10-12 (×5): qty 100

## 2020-10-12 MED ORDER — MORPHINE SULFATE (PF) 2 MG/ML IV SOLN
2.0000 mg | INTRAVENOUS | Status: DC | PRN
  Administered 2020-10-12 – 2020-10-13 (×4): 2 mg via INTRAVENOUS
  Filled 2020-10-12 (×4): qty 1

## 2020-10-12 MED ORDER — HYDRALAZINE HCL 20 MG/ML IJ SOLN: 10.0000 mg | Freq: Three times a day (TID) | INTRAMUSCULAR | Status: DC | PRN

## 2020-10-12 MED ORDER — ALPRAZOLAM 0.5 MG PO TABS
0.5000 mg | ORAL_TABLET | Freq: Two times a day (BID) | ORAL | Status: DC | PRN
  Administered 2020-10-12: 0.5 mg via ORAL
  Filled 2020-10-12: qty 1

## 2020-10-12 MED ORDER — LIP MEDEX EX OINT
TOPICAL_OINTMENT | CUTANEOUS | Status: DC | PRN
  Filled 2020-10-12: qty 7

## 2020-10-12 MED ORDER — PROCHLORPERAZINE EDISYLATE 10 MG/2ML IJ SOLN
10.0000 mg | Freq: Four times a day (QID) | INTRAMUSCULAR | Status: DC | PRN
Start: 2020-10-12 — End: 2020-10-13
  Administered 2020-10-12 – 2020-10-13 (×4): 10 mg via INTRAVENOUS
  Filled 2020-10-12 (×3): qty 2

## 2020-10-12 MED ORDER — SODIUM CHLORIDE 0.9 % IV SOLN: INTRAVENOUS | Status: DC

## 2020-10-12 NOTE — ED Triage Notes (Signed)
Pt brought in by EMS from home c/o N/V/D started yesterday. Pt reported emesis x10  and diarrhea multiple times this morning. Pt a/ox 4, ambulatory.

## 2020-10-12 NOTE — Progress Notes (Signed)
Patient has refused her potassium infusion, stated that it is hurting her arm and making her leg jump. I tried to run it as slow as I could, but she's still refusing. On call provider notified, will continue to monitor. Tomasa Hose

## 2020-10-12 NOTE — ED Notes (Signed)
Lactic received from lab, 4.0. Provider notified and aware.

## 2020-10-12 NOTE — ED Notes (Signed)
RN sent down light green and lavender to the lab.

## 2020-10-12 NOTE — ED Notes (Signed)
Patient being transported to XR at this time.

## 2020-10-12 NOTE — H&P (Signed)
History and Physical    Lindsay French ZOX:[id number] DOB: January 14, 1981 DOA: 10/12/2020  PCP: Adrienne MochaQuinn, Kiera A, PA  Patient coming from: Home  Chief Complaint: N/V/D  HPI: Lindsay French is a 40 y.o. female with medical history significant of anxiety, osteomyelitis of left tiba, chronic pain. Presenting with N/V/D. She reports that she was in her normal state of health until yesterday. She started vomiting at least 10 episodes. She has been unable to keep liquids or solids down. She reports multiple episodes of diarrhea. She tried imodium, but it didn't help any of her symptoms. So she decided to come to the ED. Of note, she is on treatment for chronic osteomyelitis. She was initially on IV ancef, but that was d/c'd 2 weeks ago d/t a reaction. This was transitioned to dapto, but she says she hasn't started that abx yet. She denies any other aggravating or alleviating factors.   ED Course: She was noted to have an elevated lactic acid. She was started on fluids, anti-emetics, pain control. ID was consulted. [facility] was called for admission.   Review of Systems:  Denies CP, dyspnea, palpitations, lightheadedness, dizziness, fevers. Reports N/V. Last BM this morning. Review of systems is otherwise negative for all not mentioned in HPI.   PMHx Past Medical History:  Diagnosis Date   ADHD    Anxiety    Asthma    usually with colds   Avascular necrosis (HCC)    Depression    Elevated LFTs    Fatty liver 04/16/2016   Noted [place] ABD   GERD (gastroesophageal reflux disease)    Heart murmur    at birth, no issues   History of heat stroke    with syncope   History of thrombocytopenia    prior to diagnosis HIV   HIV (human immunodeficiency virus infection) (HCC)    Hyperlipidemia    Hypertension    Hypothyroidism    Obese    PCOS (polycystic ovarian syndrome)    Pneumonia 04/17/2016   Wears partial dentures    upper   Wrist fracture 11/2017   Left    PSHx Past Surgical History:  Procedure  Laterality Date   HYSTEROSCOPY N/A 12/31/2017   Procedure: HYSTEROSCOPY and polypectomy;  Surgeon: Fermin SchwabYalcinkaya, Tamer, MD;  [facility];  Service: Gynecology;  Laterality: N/A;   I & D EXTREMITY Left 07/15/2020   Procedure: PARTIAL EXCISION LEFT TIBIA;  Surgeon: Nadara Mustarduda, Marcus V, MD;  [facility];  Service: Orthopedics;  Laterality: Left;   IR FLUORO GUIDE CV LINE LEFT  09/26/2020   IR RADIOLOGIST EVAL & MGMT  08/12/2020   maxillofacial surgery      SocHx  reports that she has quit smoking. Her smoking use included cigarettes. She has a 4.50 pack-year smoking history. She has never used smokeless tobacco. She reports current alcohol use of about 7.0 standard drinks of alcohol per week. She reports previous drug use. Drug: "Crack" cocaine.  Allergies  Allergen Reactions   Guaifenesin Nausea And Vomiting   Hydrocodone Itching    hot   Lactose Intolerance (Gi) Other (See Comments)    Upset stomach   Latex Other (See Comments)    As a teenager when using latex condoms she would get a yeast infection.     FamHx Family History  Problem Relation Age of Onset   Hypothyroidism Mother    Hypertension Father     Prior to Admission medications   Medication Sig Start Date End Date Taking? Authorizing  Provider  ADDERALL XR 25 MG 24 hr capsule Take by mouth every morning. 08/26/20   [provider]  albuterol (VENTOLIN HFA) 108 (90 Base) MCG/ACT inhaler Inhale 1 puff into the lungs every 6 (six) hours as needed for wheezing or shortness of breath. 02/04/20   [provider]  allopurinol (ZYLOPRIM) 100 MG tablet Take 1 tablet (100 mg total) by mouth 2 (two) times daily. 09/20/20   [name], MD  ALPRAZolam Prudy Feeler) 0.5 MG tablet Take 0.5 mg by mouth 2 (two) times daily as needed for anxiety.  12/21/15   [provider]  Ascorbic Acid (VITAMIN C) 1000 MG tablet Take 1,000 mg by mouth daily. Patient not taking: Reported on 09/20/2020    [provider]  busPIRone (BUSPAR) 15 MG tablet Take 15 mg by mouth 2 (two) times daily. 06/20/20   [provider]  Colchicine 0.6 MG CAPS Take 0.6 mg by mouth 2 (two) times daily as needed. 09/20/20   [name], MD  Cyanocobalamin (VITAMIN B 12 PO) Take 1,000 mcg by mouth daily. Patient not taking: Reported on 09/20/2020    [provider]  diclofenac (VOLTAREN) 75 MG EC tablet Take 75 mg by mouth 2 (two) times daily. 09/11/20   [provider]  docusate sodium (COLACE) 100 MG capsule Take 100 mg by mouth daily.    [provider]  gabapentin (NEURONTIN) 100 MG capsule Take 300 mg by mouth at bedtime.    [provider]  HYDROmorphone (DILAUDID) 2 MG tablet Take 2 mg by mouth every 4 (four) hours as needed. 09/18/20   [provider]  HYDROmorphone (DILAUDID) 4 MG tablet Take 4 mg by mouth every 4 (four) hours as needed. 09/15/20   [provider]  irbesartan (AVAPRO) 150 MG tablet Take 150 mg by mouth every evening. 02/06/19   [provider]  melatonin 5 MG TABS Take 15 mg by mouth at bedtime. Patient not taking: Reported on 09/20/2020    [provider]  methocarbamol (ROBAXIN) 500 MG tablet Take 1 tablet (500 mg total) by mouth every 6 (six) hours as needed for muscle spasms. Patient not taking: Reported on 09/20/2020 07/18/20   Kathlen Mody, MD  mupirocin cream (BACTROBAN) 2 % Apply 1 application topically 2 (two) times daily. 09/08/20   Ginnie Smart, MD  naloxone [facility]) nasal spray 4 mg/0.1 mL SMARTSIG:1 Spray(s) Both Nares PRN 08/25/20   [provider]  Nutritional Supplements (,FEEDING SUPPLEMENT, PROSOURCE PLUS) liquid Take 30 mLs by mouth 2 (two) times daily with a meal. Patient not taking: Reported on 09/20/2020 07/18/20   Kathlen Mody, MD  OVER THE COUNTER MEDICATION Take 400 mcg by mouth daily. Methyl Folate 400 mcg Patient not taking: Reported on 09/20/2020    [provider]  OVER  THE COUNTER MEDICATION Take 50 mg by mouth daily. P-5-P Patient not taking: Reported on 09/20/2020    [provider]  polyethylene glycol (MIRALAX / GLYCOLAX) 17 g packet Take 17 g by mouth daily.    [provider]  triamcinolone cream (KENALOG) 0.1 % Apply topically 2 (two) times daily. 08/22/20   [provider]  venlafaxine XR (EFFEXOR-XR) 75 MG 24 hr capsule Take 225 mg by mouth every evening. 12/21/15   [provider]    Physical Exam: Vitals:   10/12/20 0630 10/12/20 0737 10/12/20 0926 10/12/20 1022  BP: (!) 146/100 123/61 (!) 162/86 (!) 141/85  Pulse: (!) 104 (!) 106 87  88  Resp: 19 (!) 22 17 17   Temp:      TempSrc:      SpO2: 100% 100% 100% 100%  Weight:      Height:        General: 40 y.o. female resting in bed, anxious Eyes: PERRL, normal sclera ENMT: Nares patent w/o discharge, orophaynx clear, dentition normal, ears w/o discharge/lesions/ulcers Neck: Supple, trachea midline Cardiovascular: RRR, +S1, S2, no m/g/r, equal pulses throughout Respiratory: CTABL, no w/r/r, normal WOB GI: BS+, NDNT, no masses noted, no organomegaly noted MSK: No e/c/c Skin: No rashes, bruises, ulcerations noted Neuro: A&O x 3, no focal deficits  Labs on Admission: I have personally reviewed following labs and imaging studies  CBC: Recent Labs  Lab 10/12/20 0725  WBC 12.0*  NEUTROABS 9.4*  HGB 11.5*  HCT 37.5  MCV 84.3  PLT 330   Basic Metabolic Panel: Recent Labs  Lab 10/12/20 0725  NA 136  K 3.0*  CL 99  CO2 19*  GLUCOSE 118*  BUN 7  CREATININE 0.63  CALCIUM 10.2   GFR: Estimated Creatinine Clearance: 111 mL/min (by C-G formula based on SCr of 0.63 mg/dL). Liver Function Tests: Recent Labs  Lab 10/12/20 0725  AST 69*  ALT 69*  ALKPHOS 119  BILITOT 0.6  PROT 8.7*  ALBUMIN 4.3   Recent Labs  Lab 10/12/20 0725  LIPASE 41   No results for input(s): AMMONIA in the last 168 hours. Coagulation Profile: No results for  input(s): INR, PROTIME in the last 168 hours. Cardiac Enzymes: Recent Labs  Lab 10/12/20 0725  CKTOTAL 105   BNP (last 3 results) No results for input(s): PROBNP in the last 8760 hours. HbA1C: No results for input(s): HGBA1C in the last 72 hours. CBG: No results for input(s): GLUCAP in the last 168 hours. Lipid Profile: No results for input(s): CHOL, HDL, LDLCALC, TRIG, CHOLHDL, LDLDIRECT in the last 72 hours. Thyroid Function Tests: No results for input(s): TSH, T4TOTAL, FREET4, T3FREE, THYROIDAB in the last 72 hours. Anemia Panel: No results for input(s): VITAMINB12, FOLATE, FERRITIN, TIBC, IRON, RETICCTPCT in the last 72 hours. Urine analysis:    Component Value Date/Time   COLORURINE STRAW (A) 07/13/2020 1255   APPEARANCEUR CLEAR 07/13/2020 1255   LABSPEC 1.006 07/13/2020 1255   PHURINE 8.0 07/13/2020 1255   GLUCOSEU NEGATIVE 07/13/2020 1255   HGBUR NEGATIVE 07/13/2020 1255   BILIRUBINUR NEGATIVE 07/13/2020 1255   KETONESUR NEGATIVE 07/13/2020 1255   PROTEINUR NEGATIVE 07/13/2020 1255   NITRITE NEGATIVE 07/13/2020 1255   LEUKOCYTESUR NEGATIVE 07/13/2020 1255    Radiological Exams on Admission: DG Chest 2 View  Result Date: 10/12/2020 CLINICAL DATA:  PICC line placement. EXAM: CHEST - 2 VIEW COMPARISON:  11/29/2019. FINDINGS: Heart size normal. No focal infiltrate. No pleural effusion or pneumothorax. No acute bony abnormality. IMPRESSION: No acute cardiopulmonary disease. Electronically Signed   By: 01/29/2020  Register   On: 10/12/2020 08:14    EKG: None obtained in ED  Assessment/Plan Intractable N/V     - place in obs, tele     - IVF, anti-emetics     - CLD, advance as tolerated  Diarrhea     - check c diff, GI PCR     - if negative, can add motility agents     - fluids, follow  Chronic osteomyelitis of left tibia PICC dysfxn     - will need PICC replacement     - IV team consulted by ED     -  ID consulted by ED; defer abx to them  Hypokalemia     -  replace K+; check Mg2+  Lactic acidosis     - secondary to above     - continue IVF  Elevated LFTs     - mild; check hep panel  Anxiety     - resume home regimen when confirmed and able  HTN     - resume home regimen when confirmed and able; will have PRNs available  DVT prophylaxis: lovenox  Code Status: FULL  Family Communication: None at bedside  Consults called: EDP spoke ID   Status is: Observation  The patient remains OBS appropriate and will d/c before 2 midnights.  Dispo: The patient is from: Home              Anticipated d/c is to: Home              Patient currently is not medically stable to d/c.   Difficult to place patient No  Time spent coordinating admission: 45 minutes  Brodi Kari A Valrie Jia DO [facility]  If 7PM-7AM, please contact night-coverage [email or web address]  10/12/2020, 11:59 AM

## 2020-10-12 NOTE — ED Provider Notes (Signed)
[facility]EMERGENCY DEPT Provider Note   CSN: [id number] Arrival date & time: 10/12/20  0540     History Chief Complaint  Patient presents with   Nausea   Emesis   Diarrhea    Lindsay French is a 40 y.o. female.  40 year old female with prior medical history as detailed below presents for evaluation.  Patient reports nausea and vomiting.  Patient with reports associated diarrhea.  Symptoms started last night.  She reports multiple episodes of vomiting.  She denies fever.  Of note, patient with PICC line in right upper arm.  Patient reports that her PICC line became dislodged last night.  Now approximately 8 inches of PICC line are exposed.  She reports that she was on antibiotics for treatment of osteomyelitis through the PICC.  She is known to Dr. Ninetta Lights of ID.  She thinks that her last dose of antibiotics was approximately 2 weeks ago.  She reports that she had a reaction to Ancef.  She was supposed to transition to daptomycin.  She reports that she never received daptomycin.  The history is provided by the patient and medical records.  Emesis Severity:  Moderate Duration:  12 hours Timing:  Constant Progression:  Unchanged Chronicity:  New Associated symptoms: diarrhea   Diarrhea Associated symptoms: vomiting       Past Medical History:  Diagnosis Date   ADHD    Anxiety    Asthma    usually with colds   Avascular necrosis (HCC)    Depression    Elevated LFTs    Fatty liver 04/16/2016   Noted [place] ABD   GERD (gastroesophageal reflux disease)    Heart murmur    at birth, no issues   History of heat stroke    with syncope   History of thrombocytopenia    prior to diagnosis HIV   HIV (human immunodeficiency virus infection) (HCC)    Hyperlipidemia    Hypertension    Hypothyroidism    Obese    PCOS (polycystic ovarian syndrome)    Pneumonia 04/17/2016   Wears partial dentures    upper   Wrist fracture 11/2017   Left    Patient Active  Problem List   Diagnosis Date Noted   Chronic narcotic use 08/30/2020   Acute hematogenous osteomyelitis of left tibia (HCC) 08/04/2020   Arthralgia of left lower leg    Chronic osteomyelitis of left tibia with draining sinus (HCC)    Cellulitis of left leg    Severe protein-calorie malnutrition (HCC)    Hypothyroidism    GERD (gastroesophageal reflux disease)    Hepatic steatosis    Thrombocytosis    Macrocytic anemia    Back pain 03/24/2019   Infertility, female 03/24/2019   PCOS (polycystic ovarian syndrome) 03/24/2019   Human immunodeficiency virus (HIV) disease (HCC) 04/24/2018   Health care maintenance 04/24/2018   Hyperlipidemia 04/10/2017   Community acquired pneumonia of right upper lobe of lung 04/16/2016   Acute respiratory failure with hypoxia (HCC) 04/16/2016   Acute bronchitis 04/16/2016   Hypertension 04/16/2016   Class 3 obesity 03/21/2016   Screening examination for venereal disease 03/30/2014   Encounter for long-term (current) use of medications 03/30/2014   Idiopathic thrombocytopenic purpura (ITP) (HCC) 11/14/2010   ADHD (attention deficit hyperactivity disorder) 11/14/2010    Past Surgical History:  Procedure Laterality Date   HYSTEROSCOPY N/A 12/31/2017   Procedure: HYSTEROSCOPY and polypectomy;  Surgeon: Fermin Schwab, MD;  [facility]: [facility];  Service: Gynecology;  Laterality: N/A;   I & D EXTREMITY Left 07/15/2020   Procedure: PARTIAL EXCISION LEFT TIBIA;  Surgeon: [name], MD;  Location: [facility] OR;  Service: Orthopedics;  Laterality: Left;   IR FLUORO GUIDE CV LINE LEFT  09/26/2020   IR RADIOLOGIST EVAL & MGMT  08/12/2020   maxillofacial surgery       OB History   No obstetric history on file.     Family History  Problem Relation Age of Onset   Hypothyroidism Mother    Hypertension Father     Social History   Tobacco Use   Smoking status: Former    Packs/day: 0.30    Years: 15.00    Pack years: 4.50    Types:  Cigarettes   Smokeless tobacco: Never  Vaping Use   Vaping Use: Never used  Substance Use Topics   Alcohol use: Yes    Alcohol/week: 7.0 standard drinks    Types: 7 Glasses of wine per week    Comment: wine  2-3 times a week   Drug use: Not Currently    Types: "Crack" cocaine    Comment: Previous hx of crack use - clean for 7 years     Home Medications Prior to Admission medications   Medication Sig Start Date End Date Taking? Authorizing Provider  ADDERALL XR 25 MG 24 hr capsule Take by mouth every morning. 08/26/20   [provider]  albuterol (VENTOLIN HFA) 108 (90 Base) MCG/ACT inhaler Inhale 1 puff into the lungs every 6 (six) hours as needed for wheezing or shortness of breath. 02/04/20   [provider]  allopurinol (ZYLOPRIM) 100 MG tablet Take 1 tablet (100 mg total) by mouth 2 (two) times daily. 09/20/20   [name], MD  ALPRAZolam Prudy Feeler) 0.5 MG tablet Take 0.5 mg by mouth 2 (two) times daily as needed for anxiety.  12/21/15   [provider]  Ascorbic Acid (VITAMIN C) 1000 MG tablet Take 1,000 mg by mouth daily. Patient not taking: Reported on 09/20/2020    [provider]  busPIRone (BUSPAR) 15 MG tablet Take 15 mg by mouth 2 (two) times daily. 06/20/20   [provider]  Colchicine 0.6 MG CAPS Take 0.6 mg by mouth 2 (two) times daily as needed. 09/20/20   [name], MD  Cyanocobalamin (VITAMIN B 12 PO) Take 1,000 mcg by mouth daily. Patient not taking: Reported on 09/20/2020    [provider]  diclofenac (VOLTAREN) 75 MG EC tablet Take 75 mg by mouth 2 (two) times daily. 09/11/20   [provider]  docusate sodium (COLACE) 100 MG capsule Take 100 mg by mouth daily.    [provider]  gabapentin (NEURONTIN) 100 MG capsule Take 300 mg by mouth at bedtime.    [provider]  HYDROmorphone (DILAUDID) 2 MG tablet Take 2 mg by mouth every 4 (four) hours as needed. 09/18/20   [provider]  HYDROmorphone (DILAUDID) 4 MG tablet Take 4 mg by mouth every 4 (four) hours as needed. 09/15/20   [provider]  irbesartan (AVAPRO) 150 MG tablet Take 150 mg by mouth every evening. 02/06/19   [provider]  melatonin 5 MG TABS Take 15 mg by mouth at bedtime. Patient not taking: Reported on 09/20/2020    [provider]  methocarbamol (ROBAXIN) 500 MG tablet Take 1 tablet (500 mg total) by mouth every 6 (six) hours as needed for muscle spasms. Patient not taking: Reported  on 09/20/2020 07/18/20   Kathlen Mody, MD  mupirocin cream (BACTROBAN) 2 % Apply 1 application topically 2 (two) times daily. 09/08/20   Ginnie Smart, MD  naloxone [facility]) nasal spray 4 mg/0.1 mL SMARTSIG:1 Spray(s) Both Nares PRN 08/25/20   [provider]  Nutritional Supplements (,FEEDING SUPPLEMENT, PROSOURCE PLUS) liquid Take 30 mLs by mouth 2 (two) times daily with a meal. Patient not taking: Reported on 09/20/2020 07/18/20   Kathlen Mody, MD  OVER THE COUNTER MEDICATION Take 400 mcg by mouth daily. Methyl Folate 400 mcg Patient not taking: Reported on 09/20/2020    [provider]  OVER THE COUNTER MEDICATION Take 50 mg by mouth daily. P-5-P Patient not taking: Reported on 09/20/2020    [provider]  polyethylene glycol (MIRALAX / GLYCOLAX) 17 g packet Take 17 g by mouth daily.    [provider]  triamcinolone cream (KENALOG) 0.1 % Apply topically 2 (two) times daily. 08/22/20   [provider]  venlafaxine XR (EFFEXOR-XR) 75 MG 24 hr capsule Take 225 mg by mouth every evening. 12/21/15   [provider]    Allergies    Guaifenesin, Hydrocodone, Lactose intolerance (gi), and Latex  Review of Systems   Review of Systems  Gastrointestinal:  Positive for diarrhea and vomiting.  All other systems reviewed and are negative.  Physical Exam Updated Vital Signs BP (!) 162/86   Pulse 87 Comment: Simultaneous filing. User may not  have seen previous data.  Temp 98.7 F (37.1 C) (Oral)   Resp 17 Comment: Simultaneous filing. User may not have seen previous data.  Ht 5\' 2"  (1.575 m)   Wt 111.1 kg   SpO2 100%   BMI 44.81 kg/m   Physical Exam Vitals and nursing note reviewed.  Constitutional:      General: She is not in acute distress.    Appearance: Normal appearance. She is well-developed.  HENT:     Head: Normocephalic and atraumatic.  Eyes:     Conjunctiva/sclera: Conjunctivae normal.     Pupils: Pupils are equal, round, and reactive to light.  Cardiovascular:     Rate and Rhythm: Normal rate and regular rhythm.     Heart sounds: Normal heart sounds.  Pulmonary:     Effort: Pulmonary effort is normal. No respiratory distress.     Breath sounds: Normal breath sounds.  Abdominal:     General: There is no distension.     Palpations: Abdomen is soft.     Tenderness: There is no abdominal tenderness.  Musculoskeletal:        General: No deformity. Normal range of motion.     Cervical back: Normal range of motion and neck supple.  Skin:    General: Skin is warm and dry.     Comments: PICC line is in the right upper extremity.  Approximately 8 inches are out and exposed.  Neurological:     General: No focal deficit present.     Mental Status: She is alert and oriented to person, place, and time.    ED Results / Procedures / Treatments   Labs (all labs ordered are listed, but only abnormal results are displayed) Labs Reviewed  COMPREHENSIVE METABOLIC PANEL - Abnormal; Notable for the following components:      Result Value   Potassium 3.0 (*)    CO2 19 (*)    Glucose, Bld 118 (*)    Total Protein 8.7 (*)    AST 69 (*)  ALT 69 (*)    Anion gap 18 (*)    All other components within normal limits  CBC WITH DIFFERENTIAL/PLATELET - Abnormal; Notable for the following components:   WBC 12.0 (*)    Hemoglobin 11.5 (*)    MCH 25.8 (*)    RDW 16.9 (*)    Neutro Abs 9.4 (*)    Monocytes Absolute  1.3 (*)    All other components within normal limits  LACTIC ACID, PLASMA - Abnormal; Notable for the following components:   Lactic Acid, Venous 4.0 (*)    All other components within normal limits  CULTURE, BLOOD (ROUTINE X 2)  CULTURE, BLOOD (ROUTINE X 2)  LIPASE, BLOOD  SEDIMENTATION RATE  C-REACTIVE PROTEIN  CK  LACTIC ACID, PLASMA    EKG None  Radiology DG Chest 2 View  Result Date: 10/12/2020 CLINICAL DATA:  PICC line placement. EXAM: CHEST - 2 VIEW COMPARISON:  11/29/2019. FINDINGS: Heart size normal. No focal infiltrate. No pleural effusion or pneumothorax. No acute bony abnormality. IMPRESSION: No acute cardiopulmonary disease. Electronically Signed   By: Maisie Fushomas  Register   On: 10/12/2020 08:14    Procedures Procedures   Medications Ordered in ED Medications  sodium chloride 0.9 % bolus 1,000 mL (1,000 mLs Intravenous New Bag/Given 10/12/20 0737)  ondansetron (ZOFRAN) injection 4 mg (4 mg Intravenous Given 10/12/20 0737)    ED Course  I have reviewed the triage vital signs and the nursing notes.  Pertinent labs & imaging results that were available during my care of the patient were reviewed by me and considered in my medical decision making (see chart for details).    MDM Rules/Calculators/A&P                          MDM  MSE complete  Lindsay French was evaluated in Emergency Department on 10/12/2020 for the symptoms described in the history of present illness. She was evaluated in the context of the global COVID-19 pandemic, which necessitated consideration that the patient might be at risk for infection with the SARS-CoV-2 virus that causes COVID-19. Institutional protocols and algorithms that pertain to the evaluation of patients at risk for COVID-19 are in a state of rapid change based on information released by regulatory bodies including the CDC and federal and state organizations. These policies and algorithms were followed during the patient's care in the  ED.  Patient is presenting with nausea, vomiting, and diarrhea.  Patient with continued nausea despite antiemetics.  Patient with evidence of dehydration on initial evaluation.  Additionally patient needs replacement of PICC line.  ID is aware of case.  They agree with plan for admission and PICC line replacement.  ID feels that initiation / continuation of daptomycin will be appropriate.  Hospital services aware of case.  C. difficile studies are pending.  Final Clinical Impression(s) / ED Diagnoses Final diagnoses:  Nausea vomiting and diarrhea    Rx / DC Orders ED Discharge Orders     None        Wynetta FinesMessick, Labrenda Lasky C, MD 10/12/20 1227

## 2020-10-12 NOTE — Progress Notes (Signed)
Pt's PICC fell out, was already about 3/4 out of arm. MD paged, IV team consulted to start PIV. Will continue to monitor.

## 2020-10-12 NOTE — ED Notes (Signed)
Patient requesting ice chips at this time.

## 2020-10-12 NOTE — Plan of Care (Signed)
°  Problem: Coping: °Goal: Level of anxiety will decrease °Outcome: Progressing °  °

## 2020-10-12 NOTE — Progress Notes (Signed)
Assessed PICC site where pt states "PICC fell out". Site is clean dry intact; guaze dressing placed and pt instructed not to remove for at least 24hrs.

## 2020-10-13 ENCOUNTER — Telehealth: Payer: Self-pay

## 2020-10-13 DIAGNOSIS — R197 Diarrhea, unspecified: Secondary | ICD-10-CM | POA: Diagnosis not present

## 2020-10-13 DIAGNOSIS — R112 Nausea with vomiting, unspecified: Secondary | ICD-10-CM | POA: Diagnosis not present

## 2020-10-13 LAB — HEPATITIS PANEL, ACUTE
HCV Ab: NONREACTIVE
Hep A IgM: NONREACTIVE
Hep B C IgM: NONREACTIVE
Hepatitis B Surface Ag: NONREACTIVE

## 2020-10-13 LAB — GASTROINTESTINAL PANEL BY PCR, STOOL (REPLACES STOOL CULTURE)

## 2020-10-13 MED ORDER — POTASSIUM CHLORIDE CRYS ER 20 MEQ PO TBCR: 20.0000 meq | EXTENDED_RELEASE_TABLET | Freq: Two times a day (BID) | ORAL | 0 refills | Status: DC

## 2020-10-13 NOTE — Telephone Encounter (Signed)
-----   Message from Odette Fraction, MD sent at 10/13/2020  8:40 AM EDT ----- Regarding: Arrange PICC line Hello,   This is patient of Dr Ninetta Lights. She is currently admitted and does not want to stay in the hospital. She needs an OPAT set up for PICC line and Daptomycin   I will order for PICC line.

## 2020-10-13 NOTE — Telephone Encounter (Signed)
Left message with IR for scheduling. Awaiting Daptomycin dosing orders and labs. Lindsay French

## 2020-10-13 NOTE — Discharge Summary (Signed)
Physician Discharge Summary  Lindsay French SWH:[id number] DOB: 02-Dec-1980 DOA: 10/12/2020  PCP: Ephriam Jenkins, PA  Admit date: 10/12/2020 Discharge date: 10/13/2020  Admitted From: home Disposition:  home  Recommendations for Outpatient Follow-up:  Follow up with Dr Johnnye Sima ASAP  Home Health: none Equipment/Devices: none  Discharge Condition: stable CODE STATUS: Full code Diet recommendation: regular  HPI: Per admitting MD, Lindsay French is a 40 y.o. female with medical history significant of anxiety, osteomyelitis of left tiba, chronic pain. Presenting with N/V/D. She reports that she was in her normal state of health until yesterday. She started vomiting at least 10 episodes. She has been unable to keep liquids or solids down. She reports multiple episodes of diarrhea. She tried imodium, but it didn't help any of her symptoms. So she decided to come to the ED. Of note, she is on treatment for chronic osteomyelitis. She was initially on IV ancef, but that was d/c'd 2 weeks ago d/t a reaction. This was transitioned to dapto, but she says she hasn't started that abx yet. She denies any other aggravating or alleviating factors.   Hospital Course / Discharge diagnoses: Principal problem  Intractable N/V - of unclear etiology, she was treated conservatively with resolution of her symptoms, she feels back to baseline and will be discharged home in stable condition  Active problems Diarrhea - C diff negative. Resolved Chronic osteomyelitis of left tibia / PICC dysfxn - patient with PICC line dysfunction, a PICC Line placement has been offered however she refused to have that done here and will have it placed as an outpatient. She was on cefazolin and was supposed to be switched to Daptomycin 2 weeks ago however patient has not started that yet. She is non toxic appearing, WBC is normal, she is very insistent to go home to shower, refusing to get PICC line or antibiotics at this time, will defer  to outpatient ID. Case discussed over the phone with Dr Camillo Flaming with ID Hypokalemia - refusing repletion both IV and po. Will call in to her pharmacy  Resume her other home medication without changes  Sepsis ruled out   Discharge Instructions   Allergies as of 10/13/2020       Reactions   Guaifenesin Nausea And Vomiting   Hydrocodone Itching   hot   Lactose Intolerance (gi) Other (See Comments)   Upset stomach   Latex Other (See Comments)   As a teenager when using latex condoms she would get a yeast infection.         Medication List     STOP taking these medications    (feeding supplement) PROSource Plus liquid   OVER THE COUNTER MEDICATION   OVER THE COUNTER MEDICATION   vitamin C 1000 MG tablet       TAKE these medications    Adderall XR 25 MG 24 hr capsule Generic drug: amphetamine-dextroamphetamine Take 25 mg by mouth every morning.   albuterol 108 (90 Base) MCG/ACT inhaler Commonly known as: VENTOLIN HFA Inhale 1 puff into the lungs every 6 (six) hours as needed for wheezing or shortness of breath.   allopurinol 100 MG tablet Commonly known as: ZYLOPRIM Take 1 tablet (100 mg total) by mouth 2 (two) times daily.   ALPRAZolam 0.5 MG tablet Commonly known as: XANAX Take 0.5 mg by mouth 2 (two) times daily as needed for anxiety.   busPIRone 15 MG tablet Commonly known as: BUSPAR Take 15 mg by mouth 2 (two) times daily.   Colchicine  0.6 MG Caps Take 0.6 mg by mouth 2 (two) times daily as needed.   diclofenac 75 MG EC tablet Commonly known as: VOLTAREN Take 75 mg by mouth 2 (two) times daily.   docusate sodium 100 MG capsule Commonly known as: COLACE Take 100 mg by mouth daily.   gabapentin 100 MG capsule Commonly known as: NEURONTIN Take 300 mg by mouth at bedtime.   HYDROmorphone 2 MG tablet Commonly known as: DILAUDID Take 2 mg by mouth every 4 (four) hours as needed. What changed: Another medication with the same name was removed.  Continue taking this medication, and follow the directions you see here.   irbesartan 150 MG tablet Commonly known as: AVAPRO Take 150 mg by mouth every evening.   mupirocin cream 2 % Commonly known as: Bactroban Apply 1 application topically 2 (two) times daily.   naloxone 4 MG/0.1ML Liqd nasal spray kit Commonly known as: NARCAN SMARTSIG:1 Spray(s) Both Nares PRN   polyethylene glycol 17 g packet Commonly known as: MIRALAX / GLYCOLAX Take 17 g by mouth daily.   potassium chloride SA 20 MEQ tablet Commonly known as: KLOR-CON Take 1 tablet (20 mEq total) by mouth 2 (two) times daily.   triamcinolone cream 0.1 % Commonly known as: KENALOG Apply topically 2 (two) times daily.   venlafaxine XR 75 MG 24 hr capsule Commonly known as: EFFEXOR-XR Take 225 mg by mouth every evening.   VITAMIN B 12 PO Take 1,000 mcg by mouth daily.       ASK your doctor about these medications    melatonin 5 MG Tabs Take 15 mg by mouth at bedtime.   methocarbamol 500 MG tablet Commonly known as: ROBAXIN Take 1 tablet (500 mg total) by mouth every 6 (six) hours as needed for muscle spasms.         Consultations: ID - phone  Procedures/Studies:  DG Chest 2 View  Result Date: 10/12/2020 CLINICAL DATA:  PICC line placement. EXAM: CHEST - 2 VIEW COMPARISON:  11/29/2019. FINDINGS: Heart size normal. No focal infiltrate. No pleural effusion or pneumothorax. No acute bony abnormality. IMPRESSION: No acute cardiopulmonary disease. Electronically Signed   By: Marcello Moores  Register   On: 10/12/2020 08:14   MR TIBIA FIBULA LEFT W WO CONTRAST  Result Date: 09/17/2020 CLINICAL DATA:  Osteomyelitis of the left tibia. EXAM: MRI OF LOWER LEFT EXTREMITY WITHOUT AND WITH CONTRAST TECHNIQUE: Multiplanar, multisequence MR imaging of the left lower leg was performed both before and after administration of intravenous contrast. CONTRAST:  20m MULTIHANCE GADOBENATE DIMEGLUMINE 529 MG/ML IV SOLN COMPARISON:   None. FINDINGS: Bones/Joint/Cartilage Serpiginous signal abnormality in the distal left femoral diametaphysis consistent with a bone infarct with surrounding bone marrow edema and enhancement which may reflect osteomyelitis versus reactive edema. Serpiginous signal abnormality in the proximal right tibial metadiaphysis consistent with a bone infarct. 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 Severe superimposed bone marrow edema in the proximal tibial metaphysis and diaphysis with interval removal of the methylmethacrylate antibiotic beads and cortical thickening and signal abnormality. Cortical resection of the anterior cortex of the proximal tibial diaphysis. Sinus track extending from the medullary cavity to the skin surface. Significant thick enhancement within the periphery of the medullary cavity with central lack of enhancement which may reflect prior debridement versus necrosis. No fracture or dislocation. Normal alignment. Mild left knee synovial enhancement which may be reactive. Ligaments Collateral ligaments are intact. Muscles and Tendons Muscles are normal. Soft tissue No fluid collection or  hematoma. No soft tissue mass. Soft tissue edema along the anterior aspect of the proximal lower leg. IMPRESSION: 1. Severe bone marrow edema and intramedullary enhancement involving the proximal left tibial metaphysis and diaphysis with interval removal of the methylmethacrylate antibiotic beads. Central area of lack of enhancement likely reflecting necrosis versus surgical debris. Overall appearance is most consistent with osteomyelitis and postsurgical changes with a sinus track extending from the medullary cavity to the skin surface through a anterior cortical surgical defect. 2. Serpiginous signal abnormality in the distal left femoral diametaphysis consistent with a bone infarct with surrounding bone marrow edema and enhancement which may reflect osteomyelitis versus reactive edema.  Electronically Signed   By: Kathreen Devoid   On: 09/17/2020 13:13   IR Fluoro Guide CV Line Left  Result Date: 09/26/2020 INDICATION: Need for central venous access for long-term IV antibiotics. EXAM: ULTRASOUND AND FLUOROSCOPIC GUIDED PICC LINE INSERTION MEDICATIONS: 1% lidocaine 2 mL CONTRAST:  None FLUOROSCOPY TIME:  6 seconds (0 mGy) COMPLICATIONS: None immediate. TECHNIQUE: The procedure, risks, benefits, and alternatives were explained to the patient and informed written consent was obtained. The right upper extremity was prepped with chlorhexidine in a sterile fashion, and a sterile drape was applied covering the operative field. Maximum barrier sterile technique with sterile gowns and gloves were used for the procedure. A timeout was performed prior to the initiation of the procedure. Local anesthesia was provided with 1% lidocaine. After the overlying soft tissues were anesthetized with 1% lidocaine, a micropuncture kit was utilized to access the right basilic vein. Real-time ultrasound guidance was utilized for vascular access including the acquisition of a permanent ultrasound image documenting patency of the accessed vessel. A guidewire was advanced to the level of the superior caval-atrial junction for measurement purposes and the PICC line was cut to length. A peel-away sheath was placed and a 40 cm, 5 [place], single lumen was inserted to level of the superior caval-atrial junction. A post procedure spot fluoroscopic was obtained. The catheter easily aspirated and flushed and was secured in place. A dressing was placed. The patient tolerated the procedure well without immediate post procedural complication. FINDINGS: After catheter placement, the tip lies within the superior cavoatrial junction. The catheter aspirates and flushes normally and is ready for immediate use. IMPRESSION: Successful ultrasound and fluoroscopic guided placement of a right basilic vein approach, 40 cm, 5 [place], single lumen  PICC with tip at the superior caval-atrial junction. The PICC line is ready for immediate use. Read by: Gareth Eagle, PA-C Electronically Signed   By: Corrie Mckusick D.O.   On: 09/26/2020 14:37     Subjective: - no chest pain, shortness of breath, no abdominal pain, nausea or vomiting.   Discharge Exam: BP 131/73 (BP Location: Right Arm)   Pulse 76   Temp 99.1 F (37.3 C) (Oral)   Resp 17   Ht '5\' 2"'  (1.575 m)   Wt 111.1 kg   SpO2 99%   BMI 44.81 kg/m   General: Pt is alert, awake, not in acute distress Cardiovascular: RRR, S1/S2 +, no rubs, no gallops Respiratory: CTA bilaterally, no wheezing, no rhonchi Abdominal: Soft, NT, ND, bowel sounds + Extremities: no edema, no cyanosis   The results of significant diagnostics from this hospitalization (including imaging, microbiology, ancillary and laboratory) are listed below for reference.     Microbiology: Recent Results (from the past 240 hour(s))  Resp Panel by RT-PCR (Flu A&B, Covid) Nasopharyngeal Swab     Status: None  Collection Time: 10/12/20 11:54 AM   Specimen: Nasopharyngeal Swab; Nasopharyngeal(NP) swabs in vial transport medium  Result Value Ref Range Status   SARS Coronavirus 2 by RT PCR NEGATIVE NEGATIVE Final    Comment: (NOTE) SARS-CoV-2 target nucleic acids are NOT DETECTED.  The SARS-CoV-2 RNA is generally detectable in upper respiratory specimens during the acute phase of infection. The lowest concentration of SARS-CoV-2 viral copies this assay can detect is 138 copies/mL. A negative result does not preclude SARS-Cov-2 infection and should not be used as the sole basis for treatment or other patient management decisions. A negative result may occur with  improper specimen collection/handling, submission of specimen other than nasopharyngeal swab, presence of viral mutation(s) within the areas targeted by this assay, and inadequate number of viral copies(<138 copies/mL). A negative result must be combined  with clinical observations, patient history, and epidemiological information. The expected result is Negative.  Fact Sheet for Patients:  EntrepreneurPulse.com.au  Fact Sheet for Healthcare Providers:  IncredibleEmployment.be  This test is no t yet approved or cleared by the [place] FDA and  has been authorized for detection and/or diagnosis of SARS-CoV-2 by FDA under an Emergency Use Authorization (EUA). This EUA will remain  in effect (meaning this test can be used) for the duration of the COVID-19 declaration under Section 564(b)(1) of the Act, 21 U.S.C.section 360bbb-3(b)(1), unless the authorization is terminated  or revoked sooner.       Influenza A by PCR NEGATIVE NEGATIVE Final   Influenza B by PCR NEGATIVE NEGATIVE Final    Comment: (NOTE) The Xpert Xpress SARS-CoV-2/FLU/RSV plus assay is intended as an aid in the diagnosis of influenza from Nasopharyngeal swab specimens and should not be used as a sole basis for treatment. Nasal washings and aspirates are unacceptable for Xpert Xpress SARS-CoV-2/FLU/RSV testing.  Fact Sheet for Patients: EntrepreneurPulse.com.au  Fact Sheet for Healthcare Providers: IncredibleEmployment.be  This test is not yet approved or cleared by the [place] FDA and has been authorized for detection and/or diagnosis of SARS-CoV-2 by FDA under an Emergency Use Authorization (EUA). This EUA will remain in effect (meaning this test can be used) for the duration of the COVID-19 declaration under Section 564(b)(1) of the Act, 21 U.S.C. section 360bbb-3(b)(1), unless the authorization is terminated or revoked.  Performed at [facility], [place] [address], [place], [place] [address]   C Difficile Quick Screen w PCR reflex     Status: None   Collection Time: 10/12/20  6:26 PM   Specimen: STOOL  Result Value Ref Range Status   C Diff antigen  NEGATIVE NEGATIVE Final   C Diff toxin NEGATIVE NEGATIVE Final   C Diff interpretation No C. difficile detected.  Final    Comment: Performed at [facility], [place] [address], [place], [place] [address]     Labs: Basic Metabolic Panel: Recent Labs  Lab 10/12/20 0725 10/12/20 1540  NA 136  --   K 3.0*  --   CL 99  --   CO2 19*  --   GLUCOSE 118*  --   BUN 7  --   CREATININE 0.63 0.61  CALCIUM 10.2  --   MG  --  1.6*   Liver Function Tests: Recent Labs  Lab 10/12/20 0725  AST 69*  ALT 69*  ALKPHOS 119  BILITOT 0.6  PROT 8.7*  ALBUMIN 4.3   CBC: Recent Labs  Lab 10/12/20 0725 10/12/20 1540  WBC 12.0* 10.3  NEUTROABS 9.4*  --  HGB 11.5* 10.5*  HCT 37.5 35.1*  MCV 84.3 85.6  PLT 330 304   CBG: No results for input(s): GLUCAP in the last 168 hours. Hgb A1c No results for input(s): HGBA1C in the last 72 hours. Lipid Profile No results for input(s): CHOL, HDL, LDLCALC, TRIG, CHOLHDL, LDLDIRECT in the last 72 hours. Thyroid function studies No results for input(s): TSH, T4TOTAL, T3FREE, THYROIDAB in the last 72 hours.  Invalid input(s): FREET3 Urinalysis    Component Value Date/Time   COLORURINE STRAW (A) 07/13/2020 1255   APPEARANCEUR CLEAR 07/13/2020 1255   LABSPEC 1.006 07/13/2020 1255   PHURINE 8.0 07/13/2020 1255   GLUCOSEU NEGATIVE 07/13/2020 1255   HGBUR NEGATIVE 07/13/2020 [place] 07/13/2020 1255   KETONESUR NEGATIVE 07/13/2020 1255   PROTEINUR NEGATIVE 07/13/2020 1255   NITRITE NEGATIVE 07/13/2020 1255   LEUKOCYTESUR NEGATIVE 07/13/2020 1255    FURTHER DISCHARGE INSTRUCTIONS:   Get Medicines reviewed and adjusted: Please take all your medications with you for your next visit with your Primary MD   Laboratory/radiological data: Please request your Primary MD to go over all hospital tests and procedure/radiological results at the follow up, please ask your Primary MD to get all Hospital records sent to  his/her office.   In some cases, they will be blood work, cultures and biopsy results pending at the time of your discharge. Please request that your primary care M.D. goes through all the records of your hospital data and follows up on these results.   Also Note the following: If you experience worsening of your admission symptoms, develop shortness of breath, life threatening emergency, suicidal or homicidal thoughts you must seek medical attention immediately by calling 911 or calling your MD immediately  if symptoms less severe.   You must read complete instructions/literature along with all the possible adverse reactions/side effects for all the Medicines you take and that have been prescribed to you. Take any new Medicines after you have completely understood and accpet all the possible adverse reactions/side effects.    Do not drive when taking Pain medications or sleeping medications (Benzodaizepines)   Do not take more than prescribed Pain, Sleep and Anxiety Medications. It is not advisable to combine anxiety,sleep and pain medications without talking with your primary care practitioner   Special Instructions: If you have smoked or chewed Tobacco  in the last 2 yrs please stop smoking, stop any regular Alcohol  and or any Recreational drug use.   Wear Seat belts while driving.   Please note: You were cared for by a hospitalist during your hospital stay. Once you are discharged, your primary care physician will handle any further medical issues. Please note that NO REFILLS for any discharge medications will be authorized once you are discharged, as it is imperative that you return to your primary care physician (or establish a relationship with a primary care physician if you do not have one) for your post hospital discharge needs so that they can reassess your need for medications and monitor your lab values.  Time coordinating discharge: 40 minutes  SIGNED:  Marzetta Board, MD,  PhD 10/13/2020, 8:38 AM

## 2020-10-13 NOTE — Telephone Encounter (Signed)
Left voicemail to IR to cancel scheduling of picc. Valarie Cones

## 2020-10-13 NOTE — Progress Notes (Signed)
MD order to discharge.   Discharge instructions reviewed with pt and her husband Brynda Greathouse. They both verbalize understanding of instruction.  D/C home. Used wheelchair to entry accompanied by staff and husband.

## 2020-10-13 NOTE — Progress Notes (Addendum)
ID Brief Note  Patient has not been seen.  I was called by Dr Stevphen Meuse that patient wants to go home. She is not willing to stay in the hospital for PICC line and IV daptomycin as planned outpatient by Dr Ninetta Lights.   Given these, I have messaged  [facility] office staff to arrange for PICC line placement/starting IV daptomycin or Vancomycin depending upon cost and fu with Dr Ninetta Lights in 1-2 weeks. Pharamacy will look into cost options for eitherVancomycin or daptomycin.   Will plan to give one dose of oritavancin before she leaves   Odette Fraction, MD Infectious Disease Physician [facility] [address] [place], [place] [address] Phone: [phone]  Fax: [phone]

## 2020-10-13 NOTE — Telephone Encounter (Signed)
There has been some confusion on this patient.   Daptomycin was almost $450/day for her so it is not an option. She is set up to get dalbavancin infusions at home with [facility] via peripheral sticks. No need to call for PICC placement and no orders are needed. Diminique faxed them on Monday. Spoke to [place], [place] pharmacist rounding with Dr. Elinor Parkinson at [facility]. They are on the same page.   Thanks [place]!

## 2020-10-17 ENCOUNTER — Other Ambulatory Visit: Payer: Self-pay | Admitting: Infectious Diseases

## 2020-10-17 LAB — CULTURE, BLOOD (ROUTINE X 2)
Culture: NO GROWTH
Special Requests: ADEQUATE

## 2020-10-20 ENCOUNTER — Ambulatory Visit: Payer: 59 | Admitting: Infectious Diseases

## 2020-10-21 ENCOUNTER — Ambulatory Visit (INDEPENDENT_AMBULATORY_CARE_PROVIDER_SITE_OTHER): Payer: 59 | Admitting: Infectious Diseases

## 2020-10-21 ENCOUNTER — Inpatient Hospital Stay: Payer: Self-pay

## 2020-10-21 ENCOUNTER — Telehealth: Payer: Self-pay

## 2020-10-21 ENCOUNTER — Other Ambulatory Visit: Payer: Self-pay

## 2020-10-21 ENCOUNTER — Encounter: Payer: Self-pay | Admitting: Infectious Diseases

## 2020-10-21 DIAGNOSIS — M7989 Other specified soft tissue disorders: Secondary | ICD-10-CM | POA: Diagnosis not present

## 2020-10-21 DIAGNOSIS — K047 Periapical abscess without sinus: Secondary | ICD-10-CM | POA: Diagnosis not present

## 2020-10-21 DIAGNOSIS — M86462 Chronic osteomyelitis with draining sinus, left tibia and fibula: Secondary | ICD-10-CM | POA: Diagnosis not present

## 2020-10-21 DIAGNOSIS — B20 Human immunodeficiency virus [HIV] disease: Secondary | ICD-10-CM | POA: Diagnosis not present

## 2020-10-21 MED ORDER — SACCHAROMYCES BOULARDII 250 MG PO CAPS: 250.0000 mg | ORAL_CAPSULE | Freq: Two times a day (BID) | ORAL | 1 refills | Status: DC

## 2020-10-21 MED ORDER — CLINDAMYCIN HCL 300 MG PO CAPS: 300.0000 mg | ORAL_CAPSULE | Freq: Three times a day (TID) | ORAL | 0 refills | Status: DC

## 2020-10-21 NOTE — Telephone Encounter (Signed)
Patient reports that she has not started her Dalvance infusions. Patient to get 1000mg  Q2 weeks. Was scheduled to start 6/28 per [facility] however due to recent hospitalization, the dose was rescheduled. First dose is scheduled for 7/8. Patient arrived to appointment with her infusion materials believing our office would be infusing the medication today.   Deferring first infusion to home health.

## 2020-10-21 NOTE — Assessment & Plan Note (Signed)
HIV 1 RNA Quant  Date Value  08/30/2020 <20 Copies/mL (H)  03/10/2020 <20 Copies/mL  03/04/2019 38 copies/mL (H)   CD4 T Cell Abs (/uL)  Date Value  08/30/2020 664  03/10/2020 545  03/04/2019 773   Her labs look great Will continue dovato. Eventually back to her primary?

## 2020-10-21 NOTE — Progress Notes (Signed)
Subjective:    Patient ID: Lindsay French, female  DOB: March 11, 1981, 40 y.o.        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  She has had LLE pain since November. I got a my chart message from Lindsay French this week that she had AVN (MRI [place]) but also had erythema over her L shin. She has been seen by [facility] and [facility]. I started her on keflex. 07-05-20 She has significant pain. Taking Morphine tid and oxycodone tid. MRI at OSH: Geographic signal abnormality in the proximal tibia and distal fibular (partially imaged). Findings are similar nonspecific but most consistent with severe avascular necrosis. However, given degree of surrounding edema and periosteal reaction, unable to exclude infection or marrow infiltrative process (such as leukemia/lymphoma). Recommend correlation with laboratory data and any systemic sources predisposing patient to AVN (such as sickle cell disease). Depending on these findings, recommend short interval follow-up MRI which also includes the femur.    I saw her in ID f/u on 3-17- I sent her for urgent MRI (see results) due to worsening pain and swelling.  She continued on keflex with no improvement.  She states she had low grade f/c at home. I reviewed her MRI on 3-18 and called her to come to hospital on 3-19 twice. She called me back on 3-21 and I encouraged her to come to the hospital due to the fluid collection on her MRI.  She was adm to hospital 3-21, her anbx were stopped, and on 3-25 underwent  I & D, partial excision L tibia and abscess. Her Cx grew MSSA She was d/c home on 3-28 with plan for IV ancef via PIC until 5-7. She returned to ED on 4-6 due to [facility] malfunction, she had cathflow 4-7 but did not use. She states that since she had 2 mL NS flush, it resolved.  She was planned to complete the anbx on 08-27-20.   She was seen in ID clinic 5-10  and PIC was removed. She was transitioned to keflex 522m qid.  Returned (5-19) with worsening wound d/c. Worsened pain, into her arch and her medial knee. Clear, yellow-green tinted fluid. No f/c. Taking diluadid [id number] prn. Worse at night.  In addition to her keflex she had mupirocin oint added.   She was seen 09-15-20 with continued drainage,  medial knee is tender, stiff, limping. She cannot bear full wt. She missed her f/u with Lindsay French to anxiety and forgetting appt.    She was sched for MRI which she had on 09-18-20:  1. Severe bone marrow edema and intramedullary enhancement involving the proximal left tibial metaphysis and diaphysis with interval removal of the methylmethacrylate antibiotic beads. Central area of lack of enhancement likely reflecting necrosis versus surgical debris. Overall appearance is most consistent with osteomyelitis and postsurgical changes with a sinus track extending from the medullary cavity to the skin surface through a anterior cortical surgical defect. 2. Serpiginous signal abnormality in the distal left femoral diametaphysis consistent with a bone infarct with surrounding bone marrow edema and enhancement which may reflect osteomyelitis versus reactive edema.   She was seen by ortho today, had fluid expressed. Told that her wound is "more healed" than expected. She had blood drawn as well. She had fever last week, none since. Has had minimal drainage.    ESR 128 (07-18-20)  58 (08-30-20) 20 (10-12-20) CRP 3.9                      13.7  0.6   She was seen in hospital 6-22 to 6-23 after having PIC malfunction, diarrhea. She had burning with having her K+ repleted and left before being seen by ID.  Dapto was not able to be covered by her insurance and she was changed to weekly dalbavancin infusions.   Today she is concerned about a painful lump on her L AC fossa. It has been present for last 3 days. She had IV there (but nor pic).  Her PIC was  in her RUE and she has no problems there.  Still has leg pain.  Has not had anbx since she left hospital.  Her next dose is due.  She wants probiotics  No problems with [name].   ALso has dental infection with abscess, painful and hard to eat.   HIV 1 RNA Quant  Date Value  08/30/2020 <20 Copies/mL (H)  03/10/2020 <20 Copies/mL  03/04/2019 38 copies/mL (H)   CD4 T Cell Abs (/uL)  Date Value  08/30/2020 664  03/10/2020 545  03/04/2019 773     Health Maintenance  Topic Date Due  . Pneumococcal Vaccine 79-29 Years old (1 - PCV) Never done  . TETANUS/TDAP  Never done  . PAP SMEAR-Modifier  12/21/2014  . COVID-19 Vaccine (3 - Moderna risk series) 12/19/2019  . INFLUENZA VACCINE  11/21/2020  . Hepatitis C Screening  Completed  . HIV Screening  Completed  . HPV VACCINES  Aged Out      Review of Systems  Constitutional:  Negative for chills and fever.  HENT:         Dental pain.   Cardiovascular:  Negative for leg swelling.  Gastrointestinal:  Negative for constipation and diarrhea.  Genitourinary:  Negative for dysuria.  Musculoskeletal:  Positive for myalgias.   Please see HPI. All other systems reviewed and negative.     Objective:  Physical Exam Vitals reviewed.  Constitutional:      Appearance: She is obese.  HENT:     Mouth/Throat:     Mouth: Mucous membranes are moist.     Pharynx: No oropharyngeal exudate.      Comments: Eroded, root exposed. Tender.  Eyes:     Extraocular Movements: Extraocular movements intact.     Pupils: Pupils are equal, round, and reactive to light.  Cardiovascular:     Rate and Rhythm: Normal rate and regular rhythm.  Pulmonary:     Effort: Pulmonary effort is normal.     Breath sounds: Normal breath sounds.  Abdominal:     General: Bowel sounds are normal. There is no distension.     Palpations: Abdomen is soft.     Tenderness: There is no abdominal tenderness.  Musculoskeletal:       Arms:     Cervical back: Normal  range of motion.     Right lower leg: No edema.     Left lower leg: No edema.       Legs:     Comments: Palpable cordis.   L knee with tenderness medially.  Wound is well healed with no breakdown, no tenderness or fluctuance.   Neurological:     Mental Status: She is alert.          Assessment & Plan:

## 2020-10-21 NOTE — Assessment & Plan Note (Addendum)
She will call her nurse to start her infusions.  Her wound is looking much better Her inflammatory markers were much better at her hospital visit.

## 2020-10-21 NOTE — Assessment & Plan Note (Signed)
Cordis palpable.  Will send for urgent u/s.  Will contact heme depending on results.  rtc in 2 weeks

## 2020-10-21 NOTE — Assessment & Plan Note (Signed)
Will write her clinda rx Have her seen in dental clinic asap

## 2020-10-22 ENCOUNTER — Ambulatory Visit (HOSPITAL_COMMUNITY)
Admission: RE | Admit: 2020-10-22 | Discharge: 2020-10-22 | Disposition: A | Discharge status: Home / Self Care | Payer: 59 | Source: Ambulatory Visit | Attending: Infectious Diseases | Admitting: Infectious Diseases

## 2020-10-22 ENCOUNTER — Other Ambulatory Visit (HOSPITAL_COMMUNITY): Payer: Self-pay | Admitting: Infectious Diseases

## 2020-10-22 DIAGNOSIS — R609 Edema, unspecified: Secondary | ICD-10-CM

## 2020-10-22 NOTE — Progress Notes (Signed)
Left upper extremity venous duplex has been completed. Preliminary results can be found in CV Proc through chart review.  Results were given to Dr. Ninetta Lights.   10/22/20 12:04 PM Olen Cordial RVT

## 2020-10-25 ENCOUNTER — Ambulatory Visit: Payer: 59 | Admitting: Orthopedic Surgery

## 2020-10-31 ENCOUNTER — Telehealth: Payer: Self-pay

## 2020-10-31 NOTE — Telephone Encounter (Signed)
-----   Message from [employer] sent at 10/31/2020 11:00 AM EDT ----- Regarding: Pain in Left Knee Patient called crying, she stated that she is in severe pain (Left Knee). No fever. Offered an appt. But Patient requested a call back from a nurse as soon as possible. Patient confirmed contact number on file is correct.

## 2020-10-31 NOTE — Telephone Encounter (Signed)
Called patient back, no answer. Left HIPAA compliant voicemail requesting callback.   Sandie Ano, RN

## 2020-11-09 ENCOUNTER — Other Ambulatory Visit: Payer: Self-pay

## 2020-11-09 ENCOUNTER — Emergency Department (HOSPITAL_BASED_OUTPATIENT_CLINIC_OR_DEPARTMENT_OTHER): Payer: 59

## 2020-11-09 ENCOUNTER — Emergency Department (HOSPITAL_BASED_OUTPATIENT_CLINIC_OR_DEPARTMENT_OTHER)
Admission: EM | Admit: 2020-11-09 | Discharge: 2020-11-09 | Disposition: A | Discharge status: Home / Self Care | Payer: 59 | Attending: Emergency Medicine | Admitting: Emergency Medicine

## 2020-11-09 ENCOUNTER — Emergency Department (HOSPITAL_BASED_OUTPATIENT_CLINIC_OR_DEPARTMENT_OTHER): Payer: 59 | Admitting: Radiology

## 2020-11-09 ENCOUNTER — Encounter (HOSPITAL_BASED_OUTPATIENT_CLINIC_OR_DEPARTMENT_OTHER): Payer: Self-pay | Admitting: Emergency Medicine

## 2020-11-09 DIAGNOSIS — I10 Essential (primary) hypertension: Secondary | ICD-10-CM | POA: Diagnosis not present

## 2020-11-09 DIAGNOSIS — M868X6 Other osteomyelitis, lower leg: Secondary | ICD-10-CM | POA: Insufficient documentation

## 2020-11-09 DIAGNOSIS — Z91040 Latex allergy status: Secondary | ICD-10-CM | POA: Insufficient documentation

## 2020-11-09 DIAGNOSIS — Z21 Asymptomatic human immunodeficiency virus [HIV] infection status: Secondary | ICD-10-CM | POA: Insufficient documentation

## 2020-11-09 DIAGNOSIS — Z79899 Other long term (current) drug therapy: Secondary | ICD-10-CM | POA: Diagnosis not present

## 2020-11-09 DIAGNOSIS — M86119 Other acute osteomyelitis, unspecified shoulder: Secondary | ICD-10-CM | POA: Diagnosis present

## 2020-11-09 DIAGNOSIS — Z87891 Personal history of nicotine dependence: Secondary | ICD-10-CM | POA: Diagnosis not present

## 2020-11-09 DIAGNOSIS — R079 Chest pain, unspecified: Secondary | ICD-10-CM | POA: Diagnosis present

## 2020-11-09 DIAGNOSIS — M86462 Chronic osteomyelitis with draining sinus, left tibia and fibula: Secondary | ICD-10-CM

## 2020-11-09 DIAGNOSIS — B20 Human immunodeficiency virus [HIV] disease: Secondary | ICD-10-CM

## 2020-11-09 DIAGNOSIS — R000 Tachycardia, unspecified: Secondary | ICD-10-CM | POA: Insufficient documentation

## 2020-11-09 DIAGNOSIS — Z20822 Contact with and (suspected) exposure to covid-19: Secondary | ICD-10-CM | POA: Diagnosis not present

## 2020-11-09 DIAGNOSIS — J45909 Unspecified asthma, uncomplicated: Secondary | ICD-10-CM | POA: Insufficient documentation

## 2020-11-09 DIAGNOSIS — M86112 Other acute osteomyelitis, left shoulder: Secondary | ICD-10-CM

## 2020-11-09 DIAGNOSIS — M868X1 Other osteomyelitis, shoulder: Secondary | ICD-10-CM | POA: Insufficient documentation

## 2020-11-09 DIAGNOSIS — M869 Osteomyelitis, unspecified: Secondary | ICD-10-CM

## 2020-11-09 DIAGNOSIS — E039 Hypothyroidism, unspecified: Secondary | ICD-10-CM | POA: Insufficient documentation

## 2020-11-09 LAB — CBC
HCT: 43 % (ref 36.0–46.0)
Hemoglobin: 12.9 g/dL (ref 12.0–15.0)
MCH: 25 pg — ABNORMAL LOW (ref 26.0–34.0)
MCHC: 30 g/dL (ref 30.0–36.0)
MCV: 83.5 fL (ref 80.0–100.0)
Platelets: 233 10*3/uL (ref 150–400)
RBC: 5.15 MIL/uL — ABNORMAL HIGH (ref 3.87–5.11)
RDW: 18.5 % — ABNORMAL HIGH (ref 11.5–15.5)
WBC: 8.7 10*3/uL (ref 4.0–10.5)
nRBC: 0 % (ref 0.0–0.2)

## 2020-11-09 LAB — COMPREHENSIVE METABOLIC PANEL
ALT: 134 U/L — ABNORMAL HIGH (ref 0–44)
AST: 112 U/L — ABNORMAL HIGH (ref 15–41)
Albumin: 4.5 g/dL (ref 3.5–5.0)
Alkaline Phosphatase: 135 U/L — ABNORMAL HIGH (ref 38–126)
Anion gap: 15 (ref 5–15)
BUN: <5 mg/dL — ABNORMAL LOW (ref 6–20)
CO2: 25 mmol/L (ref 22–32)
Calcium: 9.8 mg/dL (ref 8.9–10.3)
Chloride: 97 mmol/L — ABNORMAL LOW (ref 98–111)
Creatinine, Ser: 0.59 mg/dL (ref 0.44–1.00)
GFR, Estimated: >60 mL/min (ref >60)
Glucose, Bld: 142 mg/dL — ABNORMAL HIGH (ref 70–99)
Potassium: 3.7 mmol/L (ref 3.5–5.1)
Sodium: 137 mmol/L (ref 135–145)
Total Bilirubin: 1.3 mg/dL — ABNORMAL HIGH (ref 0.3–1.2)
Total Protein: 7.6 g/dL (ref 6.5–8.1)

## 2020-11-09 LAB — RESP PANEL BY RT-PCR (FLU A&B, COVID) ARPGX2
Influenza A by PCR: NEGATIVE
Influenza B by PCR: NEGATIVE
SARS Coronavirus 2 by RT PCR: NEGATIVE

## 2020-11-09 LAB — CBC WITH DIFFERENTIAL/PLATELET
Abs Immature Granulocytes: 0.04 10*3/uL (ref 0.00–0.07)
Basophils Absolute: 0 10*3/uL (ref 0.0–0.1)
Basophils Relative: 0 %
Eosinophils Absolute: 0.2 10*3/uL (ref 0.0–0.5)
Eosinophils Relative: 2 %
HCT: 38.9 % (ref 36.0–46.0)
Hemoglobin: 12 g/dL (ref 12.0–15.0)
Immature Granulocytes: 0 %
Lymphocytes Relative: 19 %
Lymphs Abs: 1.8 10*3/uL (ref 0.7–4.0)
MCH: 25.3 pg — ABNORMAL LOW (ref 26.0–34.0)
MCHC: 30.8 g/dL (ref 30.0–36.0)
MCV: 82.1 fL (ref 80.0–100.0)
Monocytes Absolute: 0.8 10*3/uL (ref 0.1–1.0)
Monocytes Relative: 8 %
Neutro Abs: 6.4 10*3/uL (ref 1.7–7.7)
Neutrophils Relative %: 71 %
RBC: 4.74 MIL/uL (ref 3.87–5.11)
RDW: 18.3 % — ABNORMAL HIGH (ref 11.5–15.5)
WBC: 9.2 10*3/uL (ref 4.0–10.5)
nRBC: 0 % (ref 0.0–0.2)

## 2020-11-09 LAB — TROPONIN I (HIGH SENSITIVITY)
Troponin I (High Sensitivity): 3 ng/L (ref <18)
Troponin I (High Sensitivity): 2 ng/L (ref <18)

## 2020-11-09 IMAGING — DX DG CHEST 2V
2 series · 2 of 2 positions shown · non-contrast
Comparison: [DATE]

CLINICAL DATA: Chest pain with shortness of breath.

EXAM:
CHEST - 2 VIEW

[chest pa]
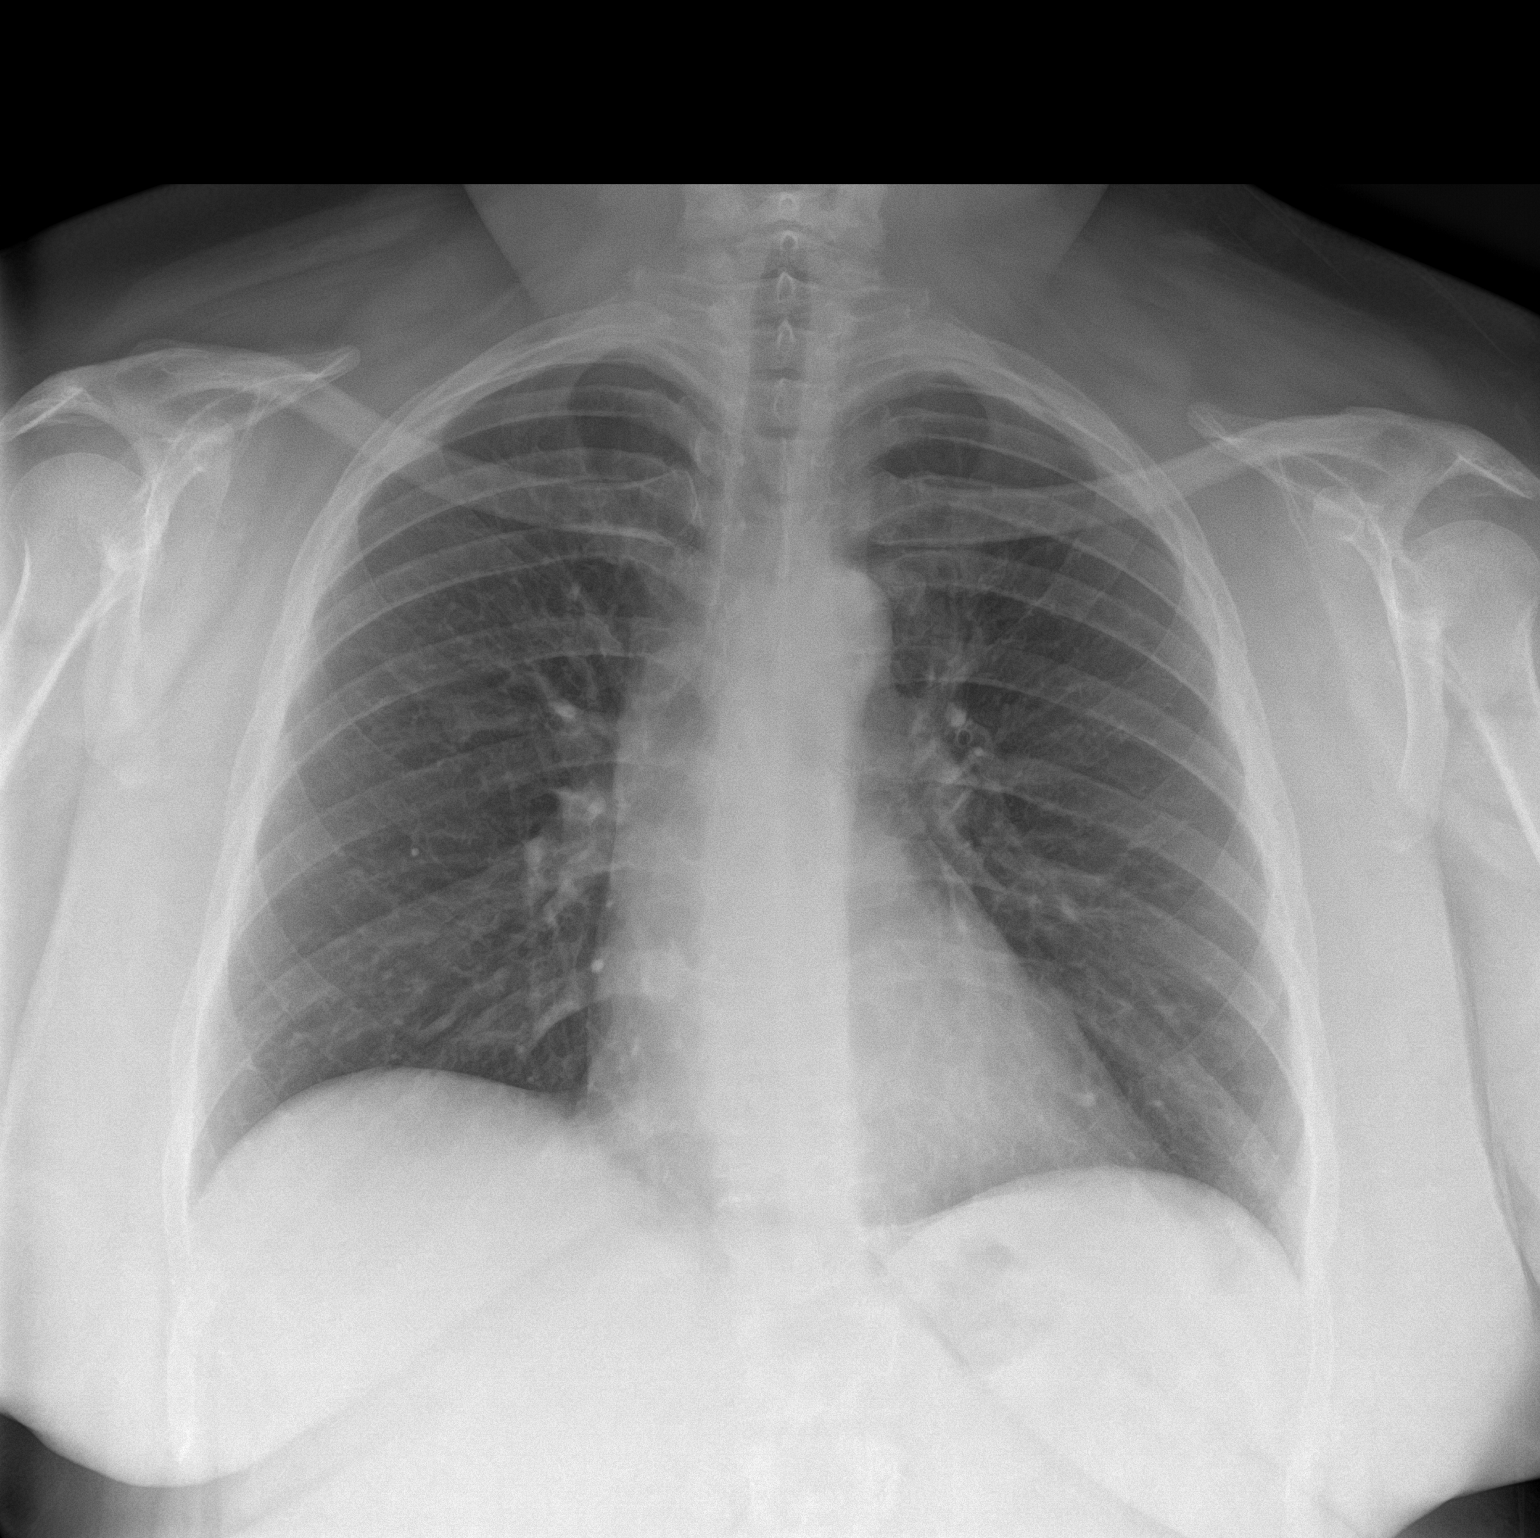

[chest lat]
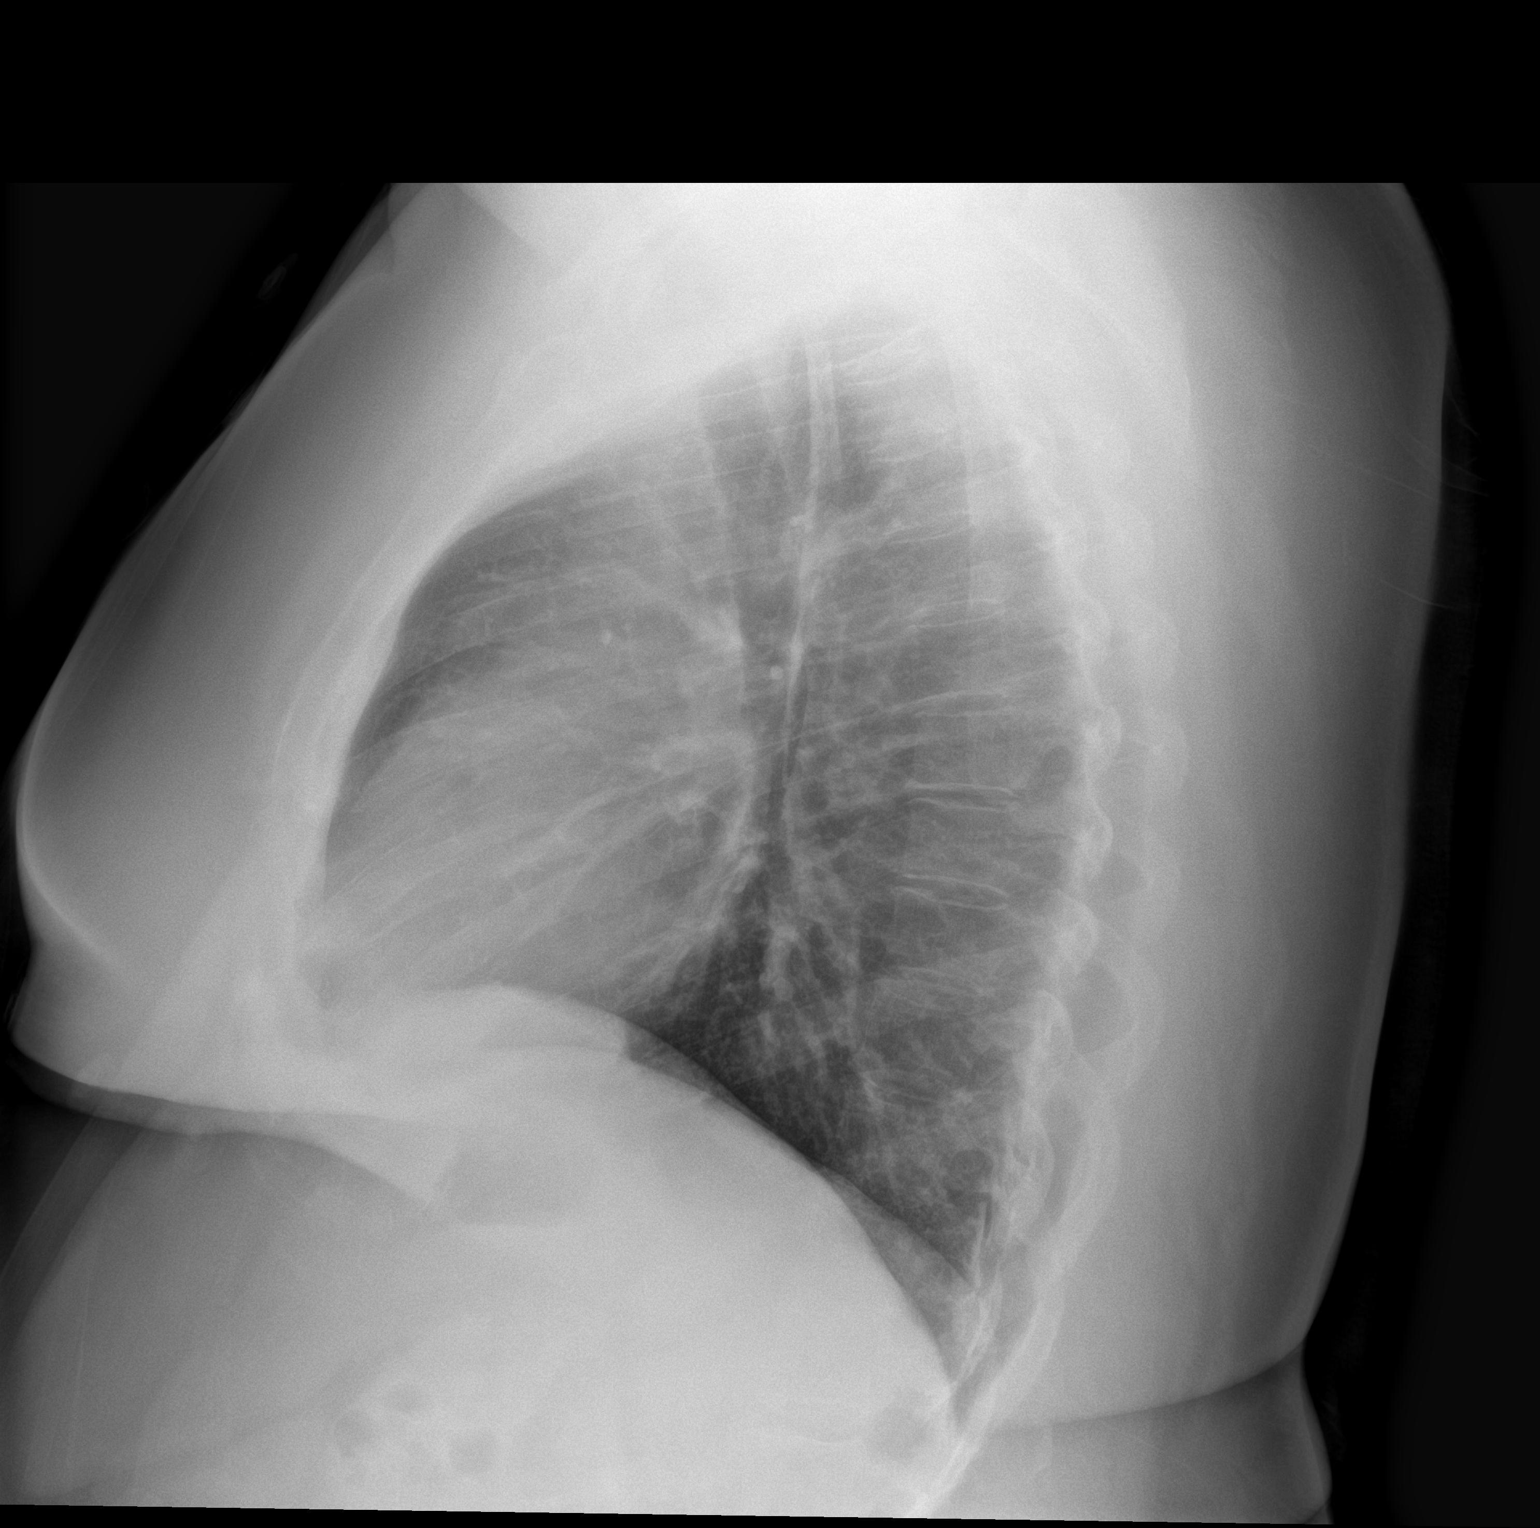

[2 of 2 positions shown; findings below may reference images not displayed]

FINDINGS: The heart size and mediastinal contours are within normal limits.
Both lungs are clear. The visualized skeletal structures are
unremarkable.
IMPRESSION: No active cardiopulmonary disease.

## 2020-11-09 IMAGING — CT CT ANGIO CHEST
2 of 7 series · 16 of 46 positions shown · IV contrast (omnipaque)
Comparison: Radiograph earlier today.

CLINICAL DATA: PE suspected, high prob

Left-sided chest pain and shortness of breath.
EXAM:
CT ANGIOGRAPHY CHEST WITH CONTRAST
TECHNIQUE: Multidetector CT imaging of the chest was performed using the
standard protocol during bolus administration of intravenous
contrast. Multiplanar CT image reconstructions and MIPs were
obtained to evaluate the vascular anatomy.
CONTRAST:  100mL OMNIPAQUE IOHEXOL 350 MG/ML SOLN

[Series 5: pe axial thins · axial · 0.84mm/px · z∈[+1294,+1462]mm · 13 of 194 slices shown]
[im 13/194  lung]
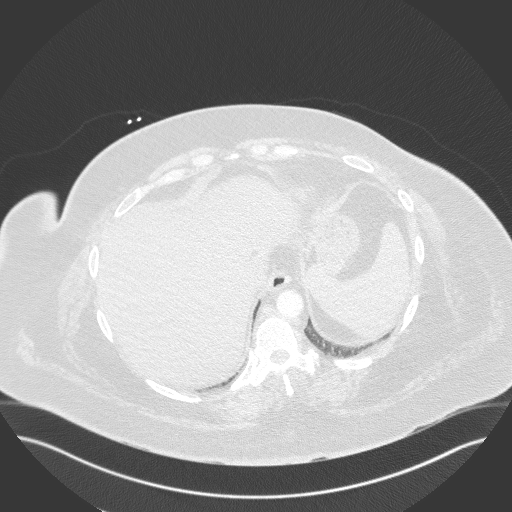
[im 26/194  soft-tissue]
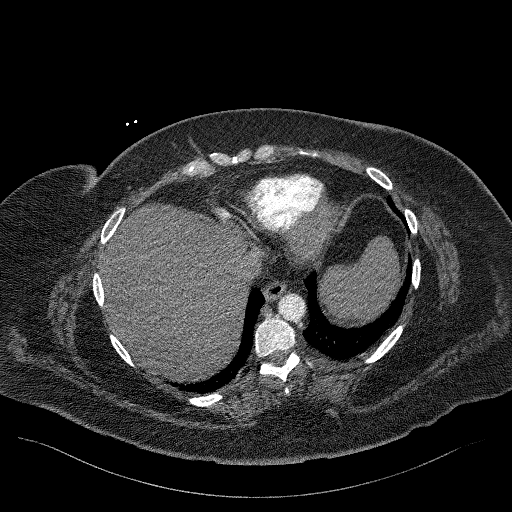
[im 39/194  lung]
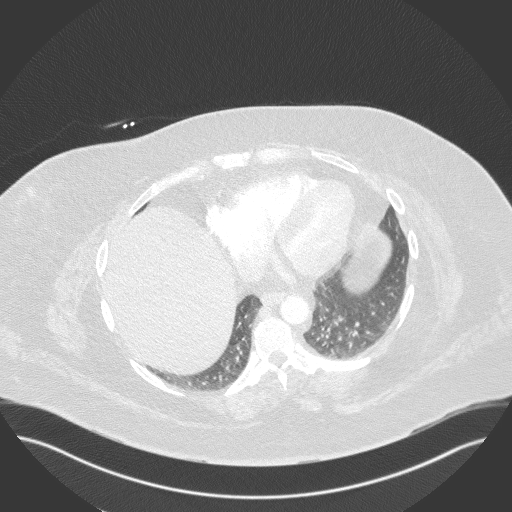
[im 52/194  soft-tissue]
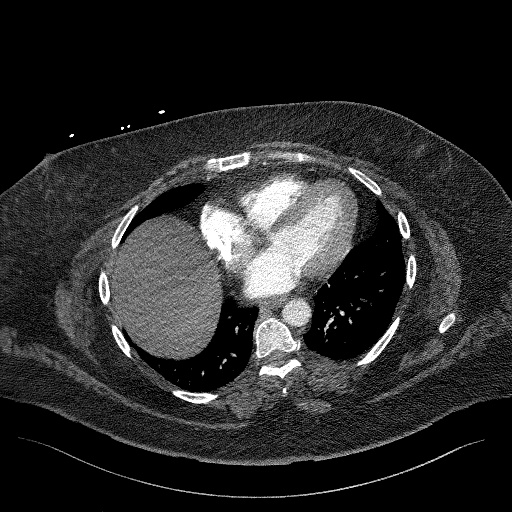
[im 65/194  lung]
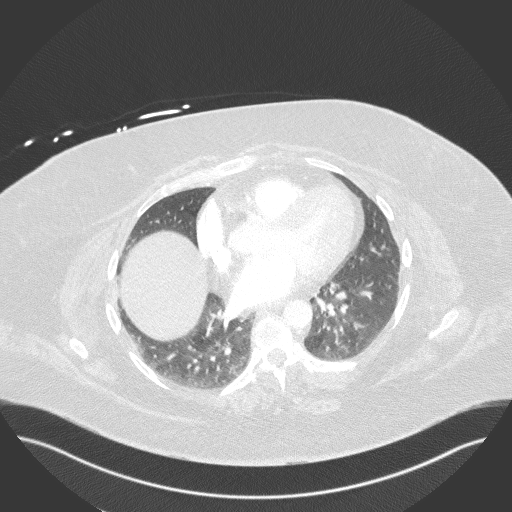
[im 78/194  soft-tissue]
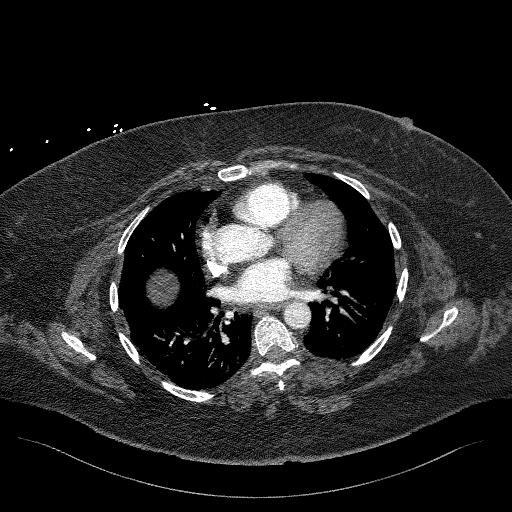
[im 103/194  lung]
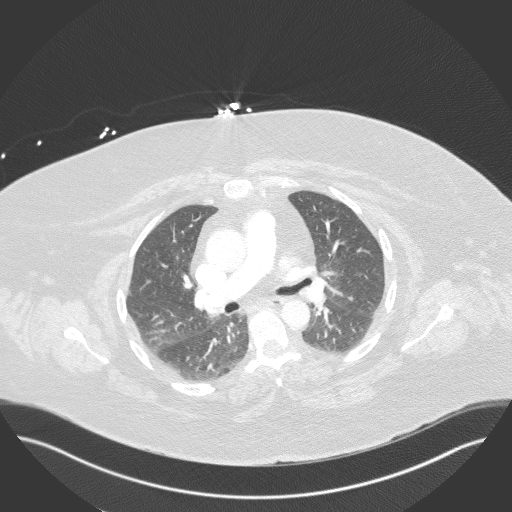
[im 116/194  soft-tissue]
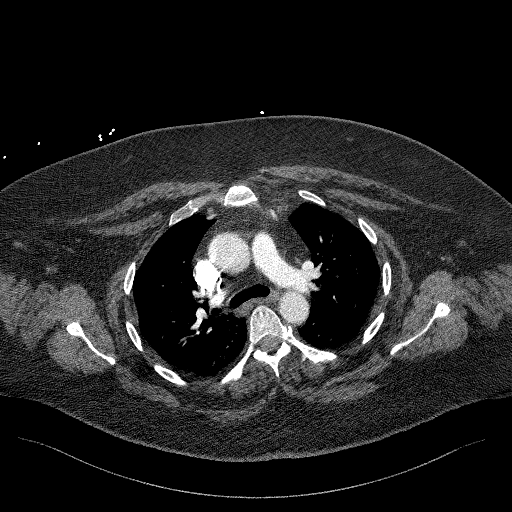
[im 129/194  lung]
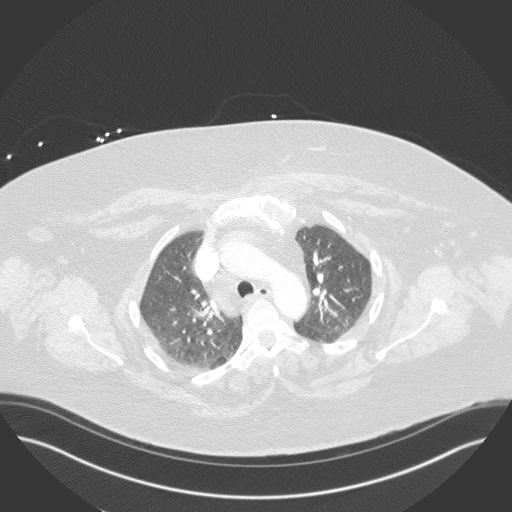
[im 142/194  soft-tissue]
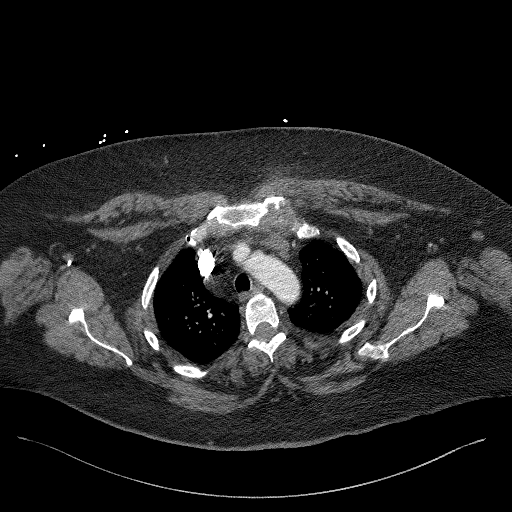
[im 155/194  lung]
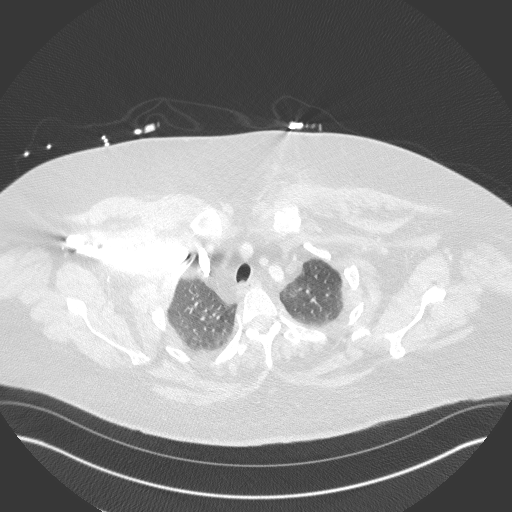
[im 168/194  soft-tissue]
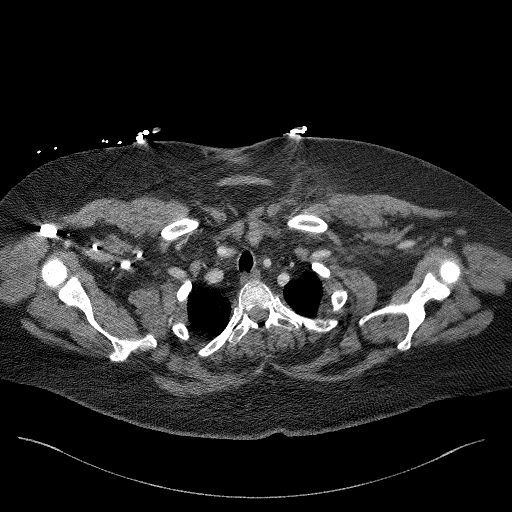
[im 181/194  lung]
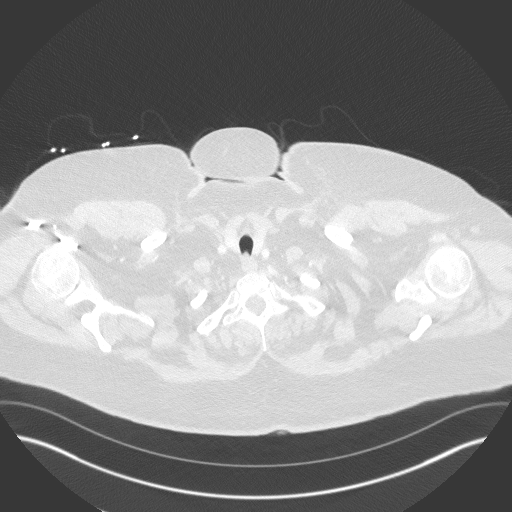

[Series 7: cor soft · coronal · 0.39mm/px · 3 of 158 slices shown]
[im 40/158  soft-tissue]
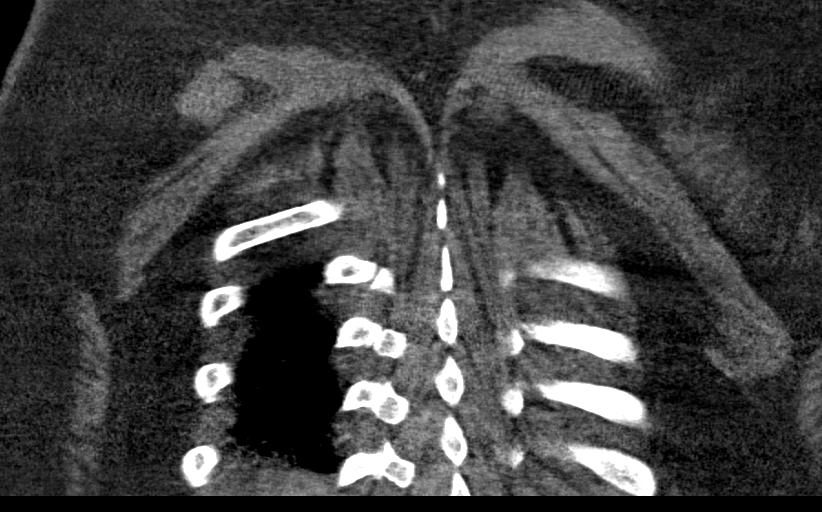
[im 79/158  soft-tissue]
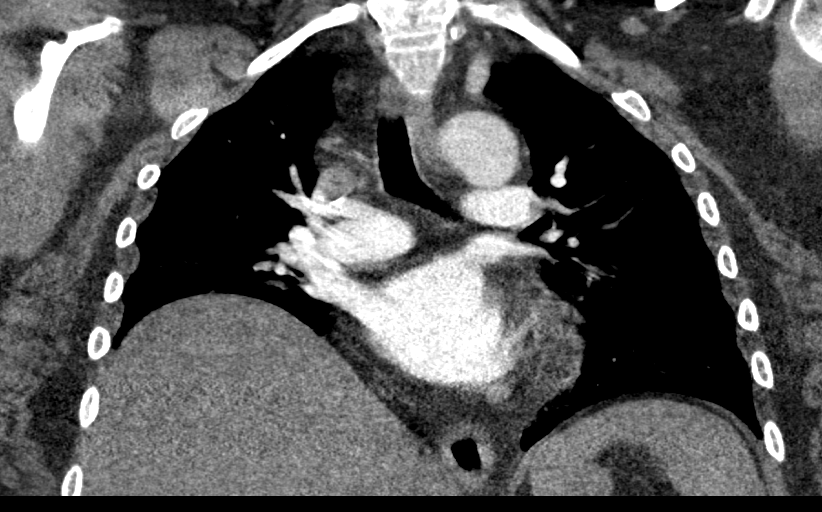
[im 118/158  soft-tissue]
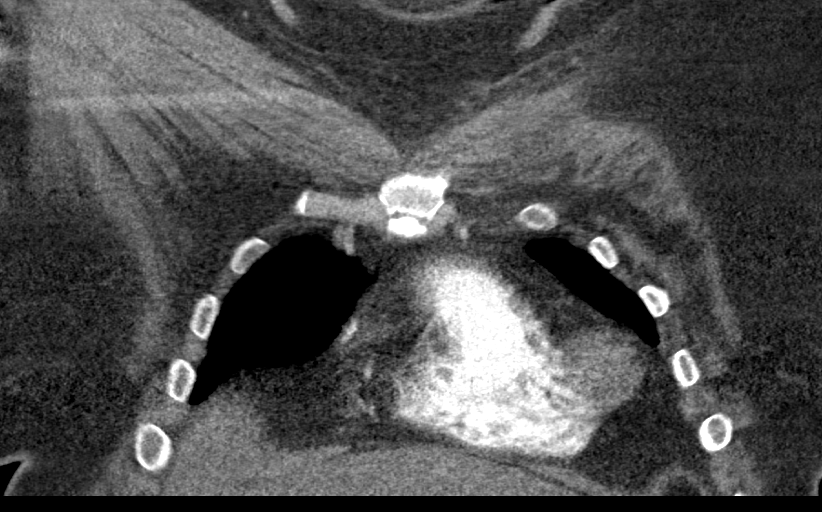

[16 of 46 positions shown; findings below may reference images not displayed]

FINDINGS: Cardiovascular: There are no filling defects within the pulmonary
arteries to suggest pulmonary embolus. Technically limited
assessment due to contrast bolus timing and soft tissue attenuation
from habitus, particularly the subsegmental branches are not well
assessed. Normal caliber thoracic aorta. No dissection or acute
findings. Heart is normal in size. There is no pericardial effusion.

Mediastinum/Nodes: Tiny hiatal hernia. No enlarged mediastinal or
hilar lymph nodes. No axillary adenopathy. No thyroid nodule.

Lungs/Pleura: Mild heterogeneous pulmonary parenchyma mild areas of
ground-glass opacity. No confluent airspace disease. Mild central
bronchial thickening. No septal thickening. No pleural fluid.

Upper Abdomen: Hepatic steatosis. No acute upper abdominal findings.

Musculoskeletal: Cortical irregularity about the medial left
clavicle at the sternoclavicular joint, series 6, image 19. There is
overlying edema in the adjacent soft tissues and extending into the
medial pectoralis musculature, series 4, image 26. Suspected joint
effusion. No discrete fluid collection or soft tissue gas.

Review of the MIP images confirms the above findings.
IMPRESSION: 1. No pulmonary embolus. Technically limited assessment due to
contrast bolus timing and soft tissue attenuation from habitus,
particularly the subsegmental branches are not well assessed.
2. Cortical irregularity about the medial left clavicle at the
sternoclavicular joint with overlying edema in the adjacent soft
tissues and extending into the medial pectoralis musculature.
Findings are suspicious for osteomyelitis or septic arthritis.
Recommend correlation with physical exam.
3. Mild heterogeneous pulmonary parenchyma with mild areas of
ground-glass opacity, likely related to air trapping/small airways
disease.
4. Incidental hepatic steatosis. Tiny hiatal hernia.

## 2020-11-09 MED ORDER — IOHEXOL 350 MG/ML SOLN
100.0000 mL | Freq: Once | INTRAVENOUS | Status: AC | PRN
  Administered 2020-11-09: 100 mL via INTRAVENOUS

## 2020-11-09 MED ORDER — LORAZEPAM 2 MG/ML IJ SOLN
2.0000 mg | Freq: Once | INTRAMUSCULAR | Status: AC
  Administered 2020-11-09: 2 mg via INTRAVENOUS
  Filled 2020-11-09: qty 1

## 2020-11-09 MED ORDER — CIPROFLOXACIN IN D5W 400 MG/200ML IV SOLN: 400.0000 mg | Freq: Once | INTRAVENOUS | Status: DC

## 2020-11-09 MED ORDER — VANCOMYCIN HCL IN DEXTROSE 1-5 GM/200ML-% IV SOLN: 1000.0000 mg | Freq: Once | INTRAVENOUS | Status: DC

## 2020-11-09 MED ORDER — KETOROLAC TROMETHAMINE 30 MG/ML IJ SOLN
30.0000 mg | Freq: Once | INTRAMUSCULAR | Status: AC
  Administered 2020-11-09: 30 mg via INTRAVENOUS
  Filled 2020-11-09: qty 1

## 2020-11-09 NOTE — ED Triage Notes (Signed)
Chest pain radiating into her back with SOB since Sunday

## 2020-11-09 NOTE — Care Plan (Signed)
Was called by EDP by patient 40 year old female history of HIV, recent diagnosis of AVN per ID note over the left lower extremity, history of left tibia abscess growing MSSA, being followed at ID clinic who presents to the ED with left chest wall pain worse with movement for several days, CT angiogram chest done negative for PE however concerning for osteomyelitis of the medial left clavicle at the sternoclavicular joint extending into the medial pectoralis musculature concerning for osteomyelitis or septic arthritis.  EDP concern patient likely with hematogenous spread and patient to be admitted for further evaluation, work-up, IV antibiotics.  Will likely need ID input.  Vital signs stable.  Patient accepted to MedSurg.  No charge.

## 2020-11-09 NOTE — ED Notes (Signed)
Pt states she would like to hold off on obtaining blood cultures and receiving Vancomycin and Cipro.  States she would like to speak with Dr. Delford Field regarding hospital admission.  Pt states she is followed by Dr. Ninetta Lights with infectious disease and would like him to manage ongoing/further treatment.  Dr. Delford Field notified.

## 2020-11-09 NOTE — ED Notes (Signed)
EKG obtained

## 2020-11-09 NOTE — ED Provider Notes (Signed)
[facility] EMERGENCY DEPT Provider Note   CSN: [id number] Arrival date & time: 11/09/20  1124     History Chief Complaint  Patient presents with   Chest Pain    Lindsay French is a 40 y.o. female.  Lindsay French has a history of HIV, on ART.  She has recently had osteomyelitis of her left tibia, and she is currently being treated with daptomycin every other week.  She was being treated with Ancef, but she developed a reaction to this medication.  She also used to have a PICC line, but she was tired of it falling out, and she was frustrated by having to avoid getting it wet in a shower.  Therefore, she has home health nurses that come and stick her every other week when it is time for her infusion.  She is followed by Dr. Ninetta Lights with infectious disease.  Apparently, there was some delay in her starting daptomycin which she attributes to insurance issues.  The history is provided by the patient.  Chest Pain Pain location:  L lateral chest Pain quality: sharp and stabbing   Pain radiates to:  Upper back Pain severity:  Severe Onset quality:  Sudden Duration:  4 days Timing:  Constant Progression:  Worsening Chronicity:  New Context comment:  No known cause Relieved by:  Nothing Worsened by:  Movement and deep breathing Ineffective treatments:  Certain positions Associated symptoms: no abdominal pain, no back pain, no cough, no diaphoresis, no fatigue, no fever, no lower extremity edema, no nausea, no palpitations, no shortness of breath and no vomiting       Past Medical History:  Diagnosis Date   ADHD    Anxiety    Asthma    usually with colds   Avascular necrosis (HCC)    Depression    Elevated LFTs    Fatty liver 04/16/2016   Noted [place] ABD   GERD (gastroesophageal reflux disease)    Heart murmur    at birth, no issues   History of heat stroke    with syncope   History of thrombocytopenia    prior to diagnosis HIV   HIV (human immunodeficiency virus  infection) (HCC)    Hyperlipidemia    Hypertension    Hypothyroidism    Obese    PCOS (polycystic ovarian syndrome)    Pneumonia 04/17/2016   Wears partial dentures    upper   Wrist fracture 11/2017   Left    Patient Active Problem List   Diagnosis Date Noted   Acute osteomyelitis of left clavicle (HCC) 11/09/2020   Dental infection 10/21/2020   Left upper extremity swelling 10/21/2020   Intractable nausea and vomiting 10/12/2020   Chronic narcotic use 08/30/2020   Acute hematogenous osteomyelitis of left tibia (HCC) 08/04/2020   Arthralgia of left lower leg    Chronic osteomyelitis of left tibia with draining sinus (HCC)    Cellulitis of left leg    Severe protein-calorie malnutrition (HCC)    Hypothyroidism    GERD (gastroesophageal reflux disease)    Hepatic steatosis    Thrombocytosis    Macrocytic anemia    Back pain 03/24/2019   Infertility, female 03/24/2019   PCOS (polycystic ovarian syndrome) 03/24/2019   Human immunodeficiency virus (HIV) disease (HCC) 04/24/2018   Health care maintenance 04/24/2018   Hyperlipidemia 04/10/2017   Community acquired pneumonia of right upper lobe of lung 04/16/2016   Acute respiratory failure with hypoxia (HCC) 04/16/2016   Acute bronchitis 04/16/2016   Hypertension  04/16/2016   Class 3 obesity 03/21/2016   Screening examination for venereal disease 03/30/2014   Encounter for long-term (current) use of medications 03/30/2014   Idiopathic thrombocytopenic purpura (ITP) (HCC) 11/14/2010   ADHD (attention deficit hyperactivity disorder) 11/14/2010    Past Surgical History:  Procedure Laterality Date   HYSTEROSCOPY N/A 12/31/2017   Procedure: HYSTEROSCOPY and polypectomy;  Surgeon: Fermin Schwab, MD;  Location: [facility];  Service: Gynecology;  Laterality: N/A;   I & D EXTREMITY Left 07/15/2020   Procedure: PARTIAL EXCISION LEFT TIBIA;  Surgeon: [name], MD;  Location: [facility] OR;  Service: Orthopedics;   Laterality: Left;   IR FLUORO GUIDE CV LINE LEFT  09/26/2020   IR RADIOLOGIST EVAL & MGMT  08/12/2020   maxillofacial surgery       OB History   No obstetric history on file.     Family History  Problem Relation Age of Onset   Hypothyroidism Mother    Hypertension Father     Social History   Tobacco Use   Smoking status: Former    Packs/day: 0.30    Years: 15.00    Pack years: 4.50    Types: Cigarettes   Smokeless tobacco: Never  Vaping Use   Vaping Use: Never used  Substance Use Topics   Alcohol use: Yes    Alcohol/week: 7.0 standard drinks    Types: 7 Glasses of wine per week    Comment: wine  2-3 times a week   Drug use: Not Currently    Types: "Crack" cocaine    Comment: Previous hx of crack use - clean for 7 years     Home Medications Prior to Admission medications   Medication Sig Start Date End Date Taking? Authorizing Provider  ADDERALL XR 25 MG 24 hr capsule Take 25 mg by mouth every morning. 08/26/20   [provider]  albuterol (VENTOLIN HFA) 108 (90 Base) MCG/ACT inhaler Inhale 1 puff into the lungs every 6 (six) hours as needed for wheezing or shortness of breath. 02/04/20   [provider]  allopurinol (ZYLOPRIM) 100 MG tablet Take 1 tablet (100 mg total) by mouth 2 (two) times daily. 09/20/20   [name], MD  ALPRAZolam Prudy Feeler) 0.5 MG tablet Take 0.5 mg by mouth 2 (two) times daily as needed for anxiety.  12/21/15   [provider]  busPIRone (BUSPAR) 15 MG tablet Take 15 mg by mouth 2 (two) times daily. 06/20/20   [provider]  clindamycin (CLEOCIN) 300 MG capsule Take 1 capsule (300 mg total) by mouth 3 (three) times daily. 10/21/20   Ginnie Smart, MD  Colchicine 0.6 MG CAPS Take 0.6 mg by mouth 2 (two) times daily as needed. 09/20/20   [name], MD  Cyanocobalamin (VITAMIN B 12 PO) Take 1,000 mcg by mouth daily. Patient not taking: No sig reported    [provider]  [name] 50-300 MG TABS Take 1  tablet by mouth daily. 10/06/20   [provider]  gabapentin (NEURONTIN) 100 MG capsule Take 300 mg by mouth at bedtime.    [provider]  HYDROmorphone (DILAUDID) 2 MG tablet Take 2 mg by mouth every 4 (four) hours as needed for moderate pain. 09/18/20   [provider]  irbesartan (AVAPRO) 150 MG tablet Take 150 mg by mouth every evening. 02/06/19   [provider]  methocarbamol (ROBAXIN) 500 MG tablet Take 1 tablet (500 mg total) by mouth every 6 (six)  hours as needed for muscle spasms. Patient not taking: No sig reported 07/18/20   Kathlen ModyAkula, Vijaya, MD  mupirocin cream (BACTROBAN) 2 % Apply 1 application topically 2 (two) times daily. Patient not taking: Reported on 10/13/2020 09/08/20   Ginnie SmartHatcher, Jeffrey C, MD  polyethylene glycol (MIRALAX / GLYCOLAX) 17 g packet Take 17 g by mouth daily as needed for mild constipation.    [provider]  potassium chloride SA (KLOR-CON) 20 MEQ tablet Take 1 tablet (20 mEq total) by mouth 2 (two) times daily. 10/13/20   Leatha GildingGherghe, Costin M, MD  saccharomyces boulardii (FLORASTOR) 250 MG capsule Take 1 capsule (250 mg total) by mouth 2 (two) times daily. 10/21/20   Ginnie SmartHatcher, Jeffrey C, MD  triamcinolone cream (KENALOG) 0.1 % Apply 1 application topically 2 (two) times daily as needed (dry skin). 08/22/20   [provider]  venlafaxine XR (EFFEXOR-XR) 75 MG 24 hr capsule Take 225 mg by mouth every evening. 12/21/15   [provider]    Allergies    Guaifenesin, Hydrocodone, Lactose intolerance (gi), Latex, and Ancef [cefazolin]  Review of Systems   Review of Systems  Constitutional:  Negative for chills, diaphoresis, fatigue and fever.  HENT:  Negative for ear pain and sore throat.   Eyes:  Negative for pain and visual disturbance.  Respiratory:  Negative for cough and shortness of breath.   Cardiovascular:  Positive for chest pain. Negative for palpitations.  Gastrointestinal:  Negative for abdominal  pain, nausea and vomiting.  Genitourinary:  Negative for dysuria and hematuria.  Musculoskeletal:  Negative for arthralgias and back pain.  Skin:  Negative for color change and rash.  Neurological:  Negative for seizures and syncope.  All other systems reviewed and are negative.  Physical Exam Updated Vital Signs BP (!) 140/104   Pulse 91   Temp 98.4 F (36.9 C) (Oral)   Resp (!) 23   Ht 5\' 2"  (1.575 m)   Wt 102.1 kg   SpO2 99%   BMI 41.15 kg/m   Physical Exam Vitals and nursing note reviewed.  Constitutional:      Appearance: She is well-developed.  HENT:     Head: Normocephalic and atraumatic.  Cardiovascular:     Rate and Rhythm: Regular rhythm. Tachycardia present.     Heart sounds: Normal heart sounds.  Pulmonary:     Effort: Pulmonary effort is normal. No tachypnea.     Breath sounds: Normal breath sounds.  Chest:     Chest wall: No deformity.  Abdominal:     Palpations: Abdomen is soft.     Tenderness: There is no abdominal tenderness.  Musculoskeletal:     Right lower leg: No edema.     Left lower leg: No edema.  Skin:    General: Skin is warm and dry.  Neurological:     General: No focal deficit present.     Mental Status: She is alert and oriented to person, place, and time.  Psychiatric:        Mood and Affect: Mood normal.        Behavior: Behavior normal.    ED Results / Procedures / Treatments   Labs (all labs ordered are listed, but only abnormal results are displayed) Labs Reviewed  CBC - Abnormal; Notable for the following components:      Result Value   RBC 5.15 (*)    MCH 25.0 (*)    RDW 18.5 (*)    All other components within normal limits  COMPREHENSIVE METABOLIC PANEL - Abnormal; Notable for the following components:   Chloride 97 (*)    Glucose, Bld 142 (*)    BUN <5 (*)    AST 112 (*)    ALT 134 (*)    Alkaline Phosphatase 135 (*)    Total Bilirubin 1.3 (*)    All other components within normal limits  RESP PANEL BY RT-PCR  (FLU A&B, COVID) ARPGX2  PREGNANCY, URINE  CBC WITH DIFFERENTIAL/PLATELET  RAPID URINE DRUG SCREEN, HOSP PERFORMED  TROPONIN I (HIGH SENSITIVITY)  TROPONIN I (HIGH SENSITIVITY)    EKG EKG Interpretation  Date/Time:  Wednesday November 09 2020 11:35:14 EDT Ventricular Rate:  117 PR Interval:  118 QRS Duration: 86 QT Interval:  318 QTC Calculation: 443 R Axis:   28 Text Interpretation: Sinus tachycardia Cannot rule out Anterior infarct , age undetermined Abnormal ECG normal axis T wave flattening/inversion anterior V leads similar to prior Confirmed by Pieter Partridge ([id number]) on 11/09/2020 3:27:37 PM  Radiology DG Chest 2 View  Result Date: 11/09/2020 CLINICAL DATA:  Chest pain with shortness of breath. EXAM: CHEST - 2 VIEW COMPARISON:  10/12/2020 FINDINGS: The heart size and mediastinal contours are within normal limits. Both lungs are clear. The visualized skeletal structures are unremarkable. IMPRESSION: No active cardiopulmonary disease. Electronically Signed   By: Kennith Center M.D.   On: 11/09/2020 12:09   CT Angio Chest PE W and/or Wo Contrast  Result Date: 11/09/2020 CLINICAL DATA:  PE suspected, high prob Left-sided chest pain and shortness of breath. EXAM: CT ANGIOGRAPHY CHEST WITH CONTRAST TECHNIQUE: Multidetector CT imaging of the chest was performed using the standard protocol during bolus administration of intravenous contrast. Multiplanar CT image reconstructions and MIPs were obtained to evaluate the vascular anatomy. CONTRAST:  OMNIPAQUE IOHEXOL 350 MG/ML SOLN COMPARISON:  Radiograph earlier today. FINDINGS: Cardiovascular: There are no filling defects within the pulmonary arteries to suggest pulmonary embolus. Technically limited assessment due to contrast bolus timing and soft tissue attenuation from habitus, particularly the subsegmental branches are not well assessed. Normal caliber thoracic aorta. No dissection or acute findings. Heart is normal in size. There is no  pericardial effusion. Mediastinum/Nodes: Tiny hiatal hernia. No enlarged mediastinal or hilar lymph nodes. No axillary adenopathy. No thyroid nodule. Lungs/Pleura: Mild heterogeneous pulmonary parenchyma mild areas of ground-glass opacity. No confluent airspace disease. Mild central bronchial thickening. No septal thickening. No pleural fluid. Upper Abdomen: Hepatic steatosis. No acute upper abdominal findings. Musculoskeletal: Cortical irregularity about the medial left clavicle at the sternoclavicular joint, series 6, image 19. There is overlying edema in the adjacent soft tissues and extending into the medial pectoralis musculature, series 4, image 26. Suspected joint effusion. No discrete fluid collection or soft tissue gas. Review of the MIP images confirms the above findings. IMPRESSION: 1. No pulmonary embolus. Technically limited assessment due to contrast bolus timing and soft tissue attenuation from habitus, particularly the subsegmental branches are not well assessed. 2. Cortical irregularity about the medial left clavicle at the sternoclavicular joint with overlying edema in the adjacent soft tissues and extending into the medial pectoralis musculature. Findings are suspicious for osteomyelitis or septic arthritis. Recommend correlation with physical exam. 3. Mild heterogeneous pulmonary parenchyma with mild areas of ground-glass opacity, likely related to air trapping/small airways disease. 4. Incidental hepatic steatosis. Tiny hiatal hernia. Electronically Signed   By: Narda Rutherford M.D.   On: 11/09/2020 15:44    Procedures Procedures   Medications Ordered in ED Medications  iohexol (OMNIPAQUE) 350  MG/ML injection 100 mL (100 mLs Intravenous Contrast Given 11/09/20 1511)  ketorolac (TORADOL) 30 MG/ML injection 30 mg (30 mg Intravenous Given 11/09/20 1548)  LORazepam (ATIVAN) injection 2 mg (2 mg Intravenous Given 11/09/20 1549)    ED Course  I have reviewed the triage vital signs and the  nursing notes.  Pertinent labs & imaging results that were available during my care of the patient were reviewed by me and considered in my medical decision making (see chart for details).  Clinical Course as of 11/09/20 1833  Wed Nov 09, 2020  1540 I spoke with Dr. Janee Morn who will accept the patient in transfer. Requests UDS. [AW]    Clinical Course User Index [AW] Koleen Distance, MD   MDM Rules/Calculators/A&P                           Serita Sheller presented with left-sided chest pain.  It was described as both consistent with a pleuritic pain as well is worse with movement of her extremity, and initially I thought it was possibly pulmonary embolism or may be even musculoskeletal.  However, she has a complicated medical history and has had osteomyelitis of the tibia which is being treated with every other week daptomycin.  ED work-up was negative for ACS or PE but did show possible osteomyelitis of her left clavicle.  I am concerned about a possible hematogenous spread.  Given this picture, the patient will be admitted to the hospital for further management. Final Clinical Impression(s) / ED Diagnoses Final diagnoses:  Osteomyelitis of clavicle (HCC)  Human immunodeficiency virus (HIV) disease (HCC)  Chronic osteomyelitis of left tibia with draining sinus [facility])    Rx / DC Orders ED Discharge Orders     None        Koleen Distance, MD 11/09/20 1836

## 2020-11-09 NOTE — ED Notes (Signed)
Patient transported to CT 

## 2020-11-09 NOTE — ED Notes (Signed)
Patient approaches Dr. Delford Field in hallway at this time and requests to leave. Dr. Delford Field explains to patient the risks of leaving and the reasons for her to be admitted. Patient denies any further treatment. Patient agrees to sign AMA paperwork.

## 2020-11-16 ENCOUNTER — Ambulatory Visit: Payer: 59 | Admitting: Endocrinology

## 2020-11-17 ENCOUNTER — Other Ambulatory Visit: Payer: Self-pay | Admitting: Orthopedic Surgery

## 2020-11-18 ENCOUNTER — Telehealth: Payer: Self-pay

## 2020-11-18 ENCOUNTER — Other Ambulatory Visit: Payer: Self-pay | Admitting: Infectious Diseases

## 2020-11-18 DIAGNOSIS — R112 Nausea with vomiting, unspecified: Secondary | ICD-10-CM

## 2020-11-18 DIAGNOSIS — M86462 Chronic osteomyelitis with draining sinus, left tibia and fibula: Secondary | ICD-10-CM

## 2020-11-18 MED ORDER — ONDANSETRON HCL 8 MG PO TABS: 8.0000 mg | ORAL_TABLET | Freq: Three times a day (TID) | ORAL | 1 refills | Status: DC | PRN

## 2020-11-18 NOTE — Telephone Encounter (Signed)
Verbal orders given to [facility] with [facility] for patient's IV antibiotics end date to be 8/30 per Dr. Ninetta Lights. Braison Snoke [name]

## 2020-11-18 NOTE — Telephone Encounter (Signed)
Patient called upset stating she is having shortness of breath and occasional chest pain. RN advised her that she needs to go to the ED for these symptoms however she refused. Stated she would just reach out to Dr. Ninetta Lights privately. RN scheduled lab appointment for blood cultures that she stated Dr. Ninetta Lights wanted ordered.Provider notified.

## 2020-11-18 NOTE — Telephone Encounter (Signed)
Lindsay French with [facility] would like to confirm patient end date for patient's IV antibiotic. Please advise.  [name] [name]

## 2020-11-21 ENCOUNTER — Other Ambulatory Visit: Payer: Self-pay

## 2020-11-21 ENCOUNTER — Other Ambulatory Visit: Payer: 59

## 2020-11-21 DIAGNOSIS — M86062 Acute hematogenous osteomyelitis, left tibia and fibula: Secondary | ICD-10-CM

## 2020-11-21 DIAGNOSIS — M86112 Other acute osteomyelitis, left shoulder: Secondary | ICD-10-CM

## 2020-11-23 ENCOUNTER — Other Ambulatory Visit: Payer: Self-pay

## 2020-11-23 ENCOUNTER — Other Ambulatory Visit: Payer: Self-pay | Admitting: Infectious Diseases

## 2020-11-24 ENCOUNTER — Telehealth (INDEPENDENT_AMBULATORY_CARE_PROVIDER_SITE_OTHER): Payer: 59 | Admitting: Infectious Diseases

## 2020-11-24 ENCOUNTER — Other Ambulatory Visit: Payer: Self-pay

## 2020-11-24 ENCOUNTER — Encounter: Payer: Self-pay | Admitting: Infectious Diseases

## 2020-11-24 DIAGNOSIS — B20 Human immunodeficiency virus [HIV] disease: Secondary | ICD-10-CM

## 2020-11-24 DIAGNOSIS — K047 Periapical abscess without sinus: Secondary | ICD-10-CM

## 2020-11-24 DIAGNOSIS — M86112 Other acute osteomyelitis, left shoulder: Secondary | ICD-10-CM | POA: Diagnosis not present

## 2020-11-24 DIAGNOSIS — M25562 Pain in left knee: Secondary | ICD-10-CM

## 2020-11-24 DIAGNOSIS — F909 Attention-deficit hyperactivity disorder, unspecified type: Secondary | ICD-10-CM

## 2020-11-24 DIAGNOSIS — M86062 Acute hematogenous osteomyelitis, left tibia and fibula: Secondary | ICD-10-CM | POA: Diagnosis not present

## 2020-11-24 DIAGNOSIS — F1190 Opioid use, unspecified, uncomplicated: Secondary | ICD-10-CM

## 2020-11-24 MED ORDER — VILAZODONE HCL 20 MG PO TABS: 20.0000 mg | ORAL_TABLET | Freq: Every day | ORAL | 0 refills | Status: DC

## 2020-11-24 NOTE — Assessment & Plan Note (Signed)
Will continue her dalbavancin til end of month Will f/u with her home health agency.

## 2020-11-24 NOTE — Assessment & Plan Note (Addendum)
She is trying new antidepressant.  Viibyrd Encouraged patience with this.  Still taking daily. Her provider is leaving her practice so she is transitioning.

## 2020-11-24 NOTE — Assessment & Plan Note (Signed)
This has improved and wound has healed.  Will continue to watch.

## 2020-11-24 NOTE — Assessment & Plan Note (Signed)
Improved, she has dental appt tomorrow.

## 2020-11-24 NOTE — Assessment & Plan Note (Signed)
Appreciate f/u with pain mgmt clinic

## 2020-11-24 NOTE — Assessment & Plan Note (Signed)
Will repeat her MRI of her knee with her infection history.

## 2020-11-24 NOTE — Progress Notes (Addendum)
Subjective:    Patient ID: Lindsay French, female  DOB: [birth year], 40 y.o.        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  She has had LLE pain since November. I got a my chart message from Lindsay French this week that she had AVN (MRI [place]) but also had erythema over her L shin. She has been seen by [facility] and [facility]. I started her on keflex. 07-05-20 She has significant pain. Taking Morphine tid and oxycodone tid. MRI at OSH: Geographic signal abnormality in the proximal tibia and distal fibular (partially imaged). Findings are similar nonspecific but most consistent with severe avascular necrosis. However, given degree of surrounding edema and periosteal reaction, unable to exclude infection or marrow infiltrative process (such as leukemia/lymphoma). Recommend correlation with laboratory data and any systemic sources predisposing patient to AVN (such as sickle cell disease). Depending on these findings, recommend short interval follow-up MRI which also includes the femur.    I saw her in ID f/u on 3-17- I sent her for urgent MRI (see results) due to worsening pain and swelling.  She continued on keflex with no improvement.  She states she had low grade f/c at home. I reviewed her MRI on 3-18 and called her to come to hospital on 3-19 twice. She called me back on 3-21 and I encouraged her to come to the hospital due to the fluid collection on her MRI.  She was adm to hospital 3-21, her anbx were stopped, and on 3-25 underwent  I & D, partial excision L tibia and abscess. Her Cx grew MSSA She was d/c home on 3-28 with plan for IV ancef via PIC until 5-7. She returned to ED on 4-6 due to [facility] malfunction, she had cathflow 4-7 but did not use. She states that since she had 2 mL NS flush, it resolved.  She was planned to complete the anbx on 08-27-20.   She was seen in ID clinic 5-10  and PIC was removed. She was transitioned to keflex 53m qid.  Returned (5-19) with worsening wound d/c. Worsened pain, into her arch and her medial knee. Clear, yellow-green tinted fluid. No f/c. Taking diluadid [id number] prn. Worse at night.  In addition to her keflex she had mupirocin oint added.   She was seen 09-15-20 with continued drainage,  medial knee is tender, stiff, limping. She cannot bear full wt. She missed her f/u with Lindsay French Givenue to anxiety and forgetting appt.    She was sched for MRI which she had on 09-18-20:  1. Severe bone marrow edema and intramedullary enhancement involving the proximal left tibial metaphysis and diaphysis with interval removal of the methylmethacrylate antibiotic beads. Central area of lack of enhancement likely reflecting necrosis versus surgical debris. Overall appearance is most consistent with osteomyelitis and postsurgical changes with a sinus track extending from the medullary cavity to the skin surface through a anterior cortical surgical defect. 2. Serpiginous signal abnormality in the distal left femoral diametaphysis consistent with a bone infarct with surrounding bone marrow edema and enhancement which may reflect osteomyelitis versus reactive edema.   She was seen by ortho today, had fluid expressed. Told that her wound is "more healed" than expected. She had blood drawn as well. She had fever last week, none since. Has had minimal drainage.    ESR 128 (07-18-20)  58 (08-30-20)         20 (10-12-20) CRP 3.9                      13.7                 0.6   She was seen in hospital 6-22 to 6-23 after having PIC malfunction, diarrhea. She had burning with having her K+ repleted and left before being seen by ID. Dapto was not able to be covered by her insurance and she was changed to weekly dalbavancin infusions.   She was seen in ED 7-20 with chest pain.  She was found on imaging to have a lesion on her CTA to have: Cortical  irregularity about the medial left clavicle at the sternoclavicular joint with overlying edema in the adjacent soft tissues and extending into the medial pectoralis musculature. Findings are suspicious for osteomyelitis or septic arthritis. Recommend correlation with physical exam.  Due to this the end date of her dalbavancin was extended 6 weeks ( (12-21-20).  Next dose (3rd) due 11-28-20.   Since she has continued CP (I have advised her to be eval in ED). She had BCx done which are ngtd.  She has been seen at [facility] this week. Was started on "fast acting morphine".  Currently out of work for the week. She feels well as "long as I am not standing or walking". Has pain in her L knee. Her CP has subsided almost entirely. Knee pain has been since end of first 6 weeks of anbx.  She has diarrhea for 4 days after she has her dalbavancin. She has started imodium AD.  She is also taking pro-biotic.   Has dental appt tomorrow. Her "infection" resolved with prev anbx but "it still needs to come out. It's an impacted wisdom tooth".   No problems with [name].   HIV 1 RNA Quant  Date Value  08/30/2020 <20 Copies/mL (H)  03/10/2020 <20 Copies/mL  03/04/2019 38 copies/mL (H)   CD4 T Cell Abs (/uL)  Date Value  08/30/2020 664  03/10/2020 545  03/04/2019 773     Health Maintenance  Topic Date Due   TETANUS/TDAP  Never done   Pneumococcal Vaccine 17-63 Years old (2 - PCV) 11/14/2011   PAP SMEAR-Modifier  12/21/2014   COVID-19 Vaccine (3 - Moderna risk series) 12/19/2019   INFLUENZA VACCINE  11/21/2020   Hepatitis C Screening  Completed   HIV Screening  Completed   HPV VACCINES  Aged Out      Review of Systems  Constitutional:  Negative for chills and fever.  Cardiovascular:  Negative for chest pain.  Gastrointestinal:  Positive for diarrhea.  Musculoskeletal:  Positive for joint pain.   Please see HPI. All other systems reviewed and negative.     Objective:  Physical  Exam 1. Due to the national emergency this service was provided using telemedicine. video.  2. Consent from the patient for the telehealth visit and that you identified patient name, dob.   3. Your locations, Pt and Provider - home, [facility]  4. Chief complaint for visit ID f/u  5. Document anyone else on the call none  6. If the visit was a phone call, that you include the time you spent on the call: 22 minutes.         Assessment & Plan:   This is a video visit

## 2020-11-24 NOTE — Assessment & Plan Note (Signed)
She is doing well Denies missed doses.  Will f/u in the next month.

## 2020-11-25 ENCOUNTER — Telehealth: Payer: Self-pay

## 2020-11-25 NOTE — Telephone Encounter (Signed)
Provider requested clarification about Dalvance infusion schedule. Per orders received by [facility] on 6/23 and confirmed on 7/29 the patient was to receive 1500mg  x1 week and then begin Q2 week infusions of 1000mg  through 8/30.   Forwarding to provider for additional orders/changes. Nyal Schachter , RN

## 2020-11-25 NOTE — Telephone Encounter (Signed)
Authorization code for MRI: [id number]  Authorization is approved through September 2nd.  Darchelle Nunes Loyola Mast, RN

## 2020-11-27 LAB — CULTURE, BLOOD (SINGLE)
MICRO NUMBER:: 12196549
MICRO NUMBER:: 12196550
Result:: NO GROWTH
Result:: NO GROWTH
SPECIMEN QUALITY:: ADEQUATE
SPECIMEN QUALITY:: ADEQUATE

## 2020-12-01 ENCOUNTER — Ambulatory Visit: Payer: 59 | Admitting: Infectious Diseases

## 2020-12-01 ENCOUNTER — Ambulatory Visit: Payer: 59

## 2020-12-02 ENCOUNTER — Other Ambulatory Visit: Payer: Self-pay | Admitting: Infectious Diseases

## 2020-12-02 DIAGNOSIS — R52 Pain, unspecified: Secondary | ICD-10-CM

## 2020-12-02 MED ORDER — LIDOCAINE 5 % EX OINT
1.0000 | TOPICAL_OINTMENT | CUTANEOUS | 0 refills | Status: DC | PRN
Start: 2020-12-02 — End: 2021-06-22

## 2020-12-05 ENCOUNTER — Telehealth: Payer: Self-pay

## 2020-12-05 NOTE — Telephone Encounter (Signed)
Received voicemail requesting clarification on MRI order. She states they can do an MRI with/without contrast or without contrast. She is wanting to know if it should truly be an MRI with contrast. Will route to provider.   [phone]  Sandie Ano, RN

## 2020-12-12 ENCOUNTER — Encounter: Payer: Self-pay | Admitting: Infectious Diseases

## 2020-12-19 ENCOUNTER — Telehealth: Payer: Self-pay

## 2020-12-19 NOTE — Telephone Encounter (Signed)
Per Dr. Ninetta Lights, verbal orders given to [facility] to discontinue Dalvance infusions. Melissa repeated orders back correctly. No PICC in place.   Lovelyn Sheeran Loyola Mast, RN

## 2020-12-21 ENCOUNTER — Telehealth: Payer: Self-pay | Admitting: Pharmacist

## 2020-12-21 NOTE — Telephone Encounter (Signed)
Larita Fife from [facility] called asking for orders to reschedule Taura's Dalvance infusion. I informed him of Dr. Moshe Cipro previous order to discontinue Dalvance due to patient request after experiencing unwanted adverse events. Passing along for an FYI to her provider.  Thanks!  Margarite Gouge, PharmD, CPP Clinical Pharmacist Practitioner Infectious Diseases Clinical Pharmacist [facility]

## 2020-12-22 ENCOUNTER — Telehealth: Payer: Self-pay | Admitting: Infectious Diseases

## 2020-12-22 NOTE — Telephone Encounter (Signed)
ESR 37  CRP 2 (12-12-20)

## 2021-01-18 ENCOUNTER — Telehealth: Payer: Self-pay

## 2021-01-18 NOTE — Telephone Encounter (Signed)
PA approved for [name] through 05/31/21. Approval faxed to the pharmacy.  Ky Moskowitz [name]

## 2021-01-24 ENCOUNTER — Ambulatory Visit: Payer: 59 | Admitting: Endocrinology

## 2021-02-13 ENCOUNTER — Other Ambulatory Visit: Payer: Self-pay

## 2021-02-13 MED ORDER — ALBUTEROL SULFATE HFA 108 (90 BASE) MCG/ACT IN AERS: 1.0000 | INHALATION_SPRAY | Freq: Four times a day (QID) | RESPIRATORY_TRACT | 0 refills | Status: DC | PRN

## 2021-02-13 NOTE — Telephone Encounter (Signed)
Patient called requesting refill on inhaler. Please advise.

## 2021-02-15 ENCOUNTER — Telehealth: Payer: Self-pay | Admitting: Infectious Diseases

## 2021-02-15 NOTE — Telephone Encounter (Signed)
Called pt to f/u.  She texted me multiple times over the w/e asking for inhalers and anbx. She stated she was going to die if I didn't do these things. I asked her to send me a message via ichart and to call the ID doctor on call (as I was out of town).  She did neither of these things, believing the phone number I gave her was to the ED (it was [phone] the hospital operator) and stated that she did not want to go to the hospital.  She called the clinic on 10-24 and her inhaler was refilled  I called and left VM on her cell phone today, inquiring as to how she was feeling and to call the clinic if any issues.

## 2021-02-22 NOTE — Progress Notes (Unsigned)
This encounter was created in error - please disregard.

## 2021-04-05 ENCOUNTER — Other Ambulatory Visit: Payer: Self-pay | Admitting: Infectious Diseases

## 2021-04-05 DIAGNOSIS — B20 Human immunodeficiency virus [HIV] disease: Secondary | ICD-10-CM

## 2021-05-06 ENCOUNTER — Encounter: Payer: Self-pay | Admitting: Internal Medicine

## 2021-05-06 NOTE — Progress Notes (Signed)
Pt directly paged me for paxlovid prescription. Reviewed documentation from urgent care visit with [facility] NP, that pt was COVID positive and there was concern interaction with ART.  [facility] and gave verbal orders for paxlovid.

## 2021-05-07 ENCOUNTER — Encounter: Payer: Self-pay | Admitting: Infectious Diseases

## 2021-05-07 NOTE — Progress Notes (Signed)
Pt called with hx of f/c. Temp 101 She was seen in urgent care yesterday with COVID+ Will call in Paxlovid Rx

## 2021-05-09 ENCOUNTER — Other Ambulatory Visit: Payer: Self-pay | Admitting: Infectious Diseases

## 2021-06-02 ENCOUNTER — Encounter: Payer: Self-pay | Admitting: Infectious Diseases

## 2021-06-12 ENCOUNTER — Other Ambulatory Visit: Payer: Self-pay | Admitting: Infectious Diseases

## 2021-06-12 DIAGNOSIS — B20 Human immunodeficiency virus [HIV] disease: Secondary | ICD-10-CM

## 2021-06-22 ENCOUNTER — Ambulatory Visit: Payer: Managed Care, Other (non HMO) | Admitting: Infectious Diseases

## 2021-06-22 ENCOUNTER — Other Ambulatory Visit: Payer: Self-pay

## 2021-06-22 VITALS — BP 122/92 | HR 103 | Resp 16 | Ht 62.0 in | Wt 222.6 lb

## 2021-06-22 DIAGNOSIS — M79605 Pain in left leg: Secondary | ICD-10-CM

## 2021-06-22 DIAGNOSIS — B20 Human immunodeficiency virus [HIV] disease: Secondary | ICD-10-CM

## 2021-06-22 DIAGNOSIS — Z113 Encounter for screening for infections with a predominantly sexual mode of transmission: Secondary | ICD-10-CM | POA: Diagnosis not present

## 2021-06-22 DIAGNOSIS — F909 Attention-deficit hyperactivity disorder, unspecified type: Secondary | ICD-10-CM

## 2021-06-22 DIAGNOSIS — M545 Low back pain, unspecified: Secondary | ICD-10-CM

## 2021-06-22 DIAGNOSIS — Z79899 Other long term (current) drug therapy: Secondary | ICD-10-CM

## 2021-06-22 DIAGNOSIS — M79604 Pain in right leg: Secondary | ICD-10-CM

## 2021-06-22 MED ORDER — ALPRAZOLAM 0.5 MG PO TABS: 0.5000 mg | ORAL_TABLET | Freq: Two times a day (BID) | ORAL | 0 refills | Status: DC | PRN

## 2021-06-22 NOTE — Assessment & Plan Note (Signed)
Will send her to wt loss clinic to see if she qualifies for ozempic.  ?

## 2021-06-22 NOTE — Assessment & Plan Note (Signed)
She is doing well ?Will check her labs.  ?Offered condoms.  ?States she is uptodate on vax ?prob needs PCV 20.  ?rtc in 3-4 weeks.  ? ?

## 2021-06-22 NOTE — Addendum Note (Signed)
Addended by: Mistee Soliman C on: 06/22/2021 05:14 PM ? ? Modules accepted: Orders ? ?

## 2021-06-22 NOTE — Assessment & Plan Note (Signed)
Will check ESR and CRP ?Will check MRI ? ?She is at risk of AVN, also with prev infection with recurred, would like to make sure this is not recurrence.  ?

## 2021-06-22 NOTE — Progress Notes (Signed)
? ?Subjective:  ? ? Patient ID: Lindsay French, female  DOB: 1981/03/20, 41 y.o.        MRN: [id number] ? ? 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  ?She has had LLE pain since November. ?I got a my chart message from Ms Sarti this week that she had AVN (MRI [place]) but also had erythema over her L shin. She has been seen by [facility] and [facility]. ?I started her on keflex. 07-05-20 ?She has significant pain. Taking Morphine tid and oxycodone tid. ?MRI at OSH: Geographic signal abnormality in the proximal tibia and distal fibular (partially imaged). Findings are similar nonspecific but most consistent with severe avascular necrosis. However, given degree of surrounding edema and periosteal reaction, unable to exclude infection or marrow infiltrative process (such as leukemia/lymphoma). Recommend correlation with laboratory data and any systemic sources predisposing patient to AVN (such as sickle cell disease). Depending on these findings, recommend short interval follow-up MRI which also includes the femur.  ?  ?I saw her in ID f/u on 3-17- I sent her for urgent MRI (see results) due to worsening pain and swelling.  ?She continued on keflex with no improvement.  ?She states she had low grade f/c at home. ?I reviewed her MRI on 3-18 and called her to come to hospital on 3-19 twice. She called me back on 3-21 and I encouraged her to come to the hospital due to the fluid collection on her MRI.  ?She was adm to hospital 3-21, her anbx were stopped, and on 07-15-20 underwent  I & D, partial excision L tibia and abscess. Her Cx grew MSSA ?She was d/c home on 3-28 with plan for IV ancef via PIC until 08-27-20. ?5-10  She was transitioned to keflex 500mg  qid.  ?Returned (5-19) with worsening wound d/c. Worsened pain, into her arch and her medial knee. ?Clear, yellow-green tinted fluid. ?No f/c. ?Taking diluadid 4mg  q4h prn. Worse at  night.  ?In addition to her keflex she had mupirocin oint added. ?  ?She was seen 09-15-20 with continued drainage,  medial knee is tender, stiff, limping. She cannot bear full wt. ?She missed her f/u with Dr due to anxiety and forgetting appt.  ?  ?She was sched for MRI which she had on 09-18-20:  ?1. Severe bone marrow edema and intramedullary enhancement involving ?the proximal left tibial metaphysis and diaphysis with interval ?removal of the methylmethacrylate antibiotic beads. Central area of ?lack of enhancement likely reflecting necrosis versus surgical ?debris. Overall appearance is most consistent with osteomyelitis and ?postsurgical changes with a sinus track extending from the medullary ?cavity to the skin surface through a anterior cortical surgical ?defect. ?2. Serpiginous signal abnormality in the distal left femoral ?diametaphysis consistent with a bone infarct with surrounding bone ?marrow edema and enhancement which may reflect osteomyelitis versus ?reactive edema. ?  ?She was seen in hospital 6-22 to 6-23 after having PIC malfunction, diarrhea. She had burning with having her K+ repleted and left before being seen by ID.  ?Dapto was not able to be covered by her insurance and she was changed to weekly dalbavancin infusions.  ?  ?Aug 2022 she was concerned about a painful lump on her L AC fossa.She was fonud to have osteo of the clavicle.  ?She was treated with IV anbx for 8 weeks (dalbavancin).  ? ?Last month she reached out with lower back. Worse with exertion. Develops  numbness. Prolonged standing. Has to sit to cook.  ?No LE numbness but has had pain in her R knee.  ?No f/c since COVID (04-2021).  ?No problems with dovato.  ?Has been on new anxiety rx (rexulti). Has quit taking xanax.  ? ?Interested in wt loss/DM rx.  ? ?HIV 1 RNA Quant  ?Date Value  ?08/30/2020 <20 Copies/mL (H)  ?03/10/2020 <20 Copies/mL  ?03/04/2019 38 copies/mL (H)  ? ?CD4 T Cell Abs (/uL)  ?Date Value  ?08/30/2020 664   ?03/10/2020 545  ?03/04/2019 773  ? ? ? ?Health Maintenance  ?Topic Date Due  ?? TETANUS/TDAP  Never done  ?? PAP SMEAR-Modifier  12/21/2014  ?? COVID-19 Vaccine (5 - Booster) 01/16/2020  ?? INFLUENZA VACCINE  11/21/2020  ?? Hepatitis C Screening  Completed  ?? HIV Screening  Completed  ?? HPV VACCINES  Aged Out  ? ? ? ? ?Review of Systems  ?Constitutional:  Negative for chills, fever and weight loss.  ?Gastrointestinal:  Negative for constipation and diarrhea.  ?Musculoskeletal:  Positive for back pain.  ?Neurological:  Negative for sensory change.  ? ?Please see HPI. All other systems reviewed and negative. ? ?   ?Objective:  ?Physical Exam ?Vitals reviewed.  ?Constitutional:   ?   General: She is not in acute distress. ?   Appearance: She is obese. She is not toxic-appearing.  ?HENT:  ?   Mouth/Throat:  ?   Mouth: Mucous membranes are moist.  ?   Pharynx: No oropharyngeal exudate.  ?Eyes:  ?   Extraocular Movements: Extraocular movements intact.  ?   Pupils: Pupils are equal, round, and reactive to light.  ?Cardiovascular:  ?   Rate and Rhythm: Normal rate and regular rhythm.  ?Pulmonary:  ?   Effort: Pulmonary effort is normal.  ?   Breath sounds: Normal breath sounds.  ?Abdominal:  ?   General: Bowel sounds are normal. There is no distension.  ?   Palpations: Abdomen is soft.  ?   Tenderness: There is no abdominal tenderness.  ?Musculoskeletal:     ?   General: Normal range of motion.  ?   Cervical back: Normal range of motion and neck supple.  ?   Right lower leg: No edema.  ?   Left lower leg: No edema.  ?Neurological:  ?   General: No focal deficit present.  ?   Mental Status: She is alert.  ?   Sensory: No sensory deficit.  ?   Motor: No weakness.  ?Psychiatric:     ?   Mood and Affect: Mood normal.  ? ? ? ? ? ? ?   ?Assessment & Plan:  ? ?

## 2021-06-23 ENCOUNTER — Other Ambulatory Visit: Payer: Managed Care, Other (non HMO)

## 2021-06-26 ENCOUNTER — Other Ambulatory Visit: Payer: Managed Care, Other (non HMO)

## 2021-06-26 ENCOUNTER — Other Ambulatory Visit: Payer: Self-pay

## 2021-06-26 DIAGNOSIS — Z79899 Other long term (current) drug therapy: Secondary | ICD-10-CM

## 2021-06-26 DIAGNOSIS — Z113 Encounter for screening for infections with a predominantly sexual mode of transmission: Secondary | ICD-10-CM

## 2021-06-26 DIAGNOSIS — B20 Human immunodeficiency virus [HIV] disease: Secondary | ICD-10-CM

## 2021-06-28 LAB — CBC
HCT: 38.1 % (ref 35.0–45.0)
Hemoglobin: 12.6 g/dL (ref 11.7–15.5)
MCH: 31 pg (ref 27.0–33.0)
MCHC: 33.1 g/dL (ref 32.0–36.0)
MCV: 93.6 fL (ref 80.0–100.0)
MPV: 11 fL (ref 7.5–12.5)
Platelets: 327 10*3/uL (ref 140–400)
RBC: 4.07 10*6/uL (ref 3.80–5.10)
RDW: 13.4 % (ref 11.0–15.0)
WBC: 13.4 10*3/uL — ABNORMAL HIGH (ref 3.8–10.8)

## 2021-06-28 LAB — COMPREHENSIVE METABOLIC PANEL
AG Ratio: 1.4 (calc) (ref 1.0–2.5)
ALT: 47 U/L — ABNORMAL HIGH (ref 6–29)
AST: 70 U/L — ABNORMAL HIGH (ref 10–30)
Albumin: 4.2 g/dL (ref 3.6–5.1)
Alkaline phosphatase (APISO): 118 U/L (ref 31–125)
BUN/Creatinine Ratio: 6 (calc) (ref 6–22)
BUN: 4 mg/dL — ABNORMAL LOW (ref 7–25)
CO2: 27 mmol/L (ref 20–32)
Calcium: 9.4 mg/dL (ref 8.6–10.2)
Chloride: 96 mmol/L — ABNORMAL LOW (ref 98–110)
Creat: 0.62 mg/dL (ref 0.50–0.97)
Globulin: 2.9 g/dL (calc) (ref 1.9–3.7)
Glucose, Bld: 137 mg/dL — ABNORMAL HIGH (ref 65–99)
Potassium: 4.1 mmol/L (ref 3.5–5.3)
Sodium: 135 mmol/L (ref 135–146)
Total Bilirubin: 0.4 mg/dL (ref 0.2–1.2)
Total Protein: 7.1 g/dL (ref 6.1–8.1)

## 2021-06-28 LAB — LIPID PANEL
Cholesterol: 256 mg/dL — ABNORMAL HIGH (ref <200)
HDL: 49 mg/dL — ABNORMAL LOW (ref >=50)
LDL Cholesterol (Calc): 170 mg/dL (calc) — ABNORMAL HIGH
Non-HDL Cholesterol (Calc): 207 mg/dL (calc) — ABNORMAL HIGH (ref <130)
Total CHOL/HDL Ratio: 5.2 (calc) — ABNORMAL HIGH (ref <5.0)
Triglycerides: 221 mg/dL — ABNORMAL HIGH (ref <150)

## 2021-06-28 LAB — RPR: RPR Ser Ql: NONREACTIVE

## 2021-06-28 LAB — C. TRACHOMATIS/N. GONORRHOEAE RNA
C. trachomatis RNA, TMA: NOT DETECTED
N. gonorrhoeae RNA, TMA: NOT DETECTED

## 2021-06-28 LAB — T-HELPER CELLS (CD4) COUNT (NOT AT ARMC)
Absolute CD4: 1117 cells/uL (ref 490–1740)
CD4 T Helper %: 50 % (ref 30–61)
Total lymphocyte count: 2233 cells/uL (ref 850–3900)

## 2021-06-28 LAB — HIV-1 RNA QUANT-NO REFLEX-BLD
HIV 1 RNA Quant: 23 Copies/mL — ABNORMAL HIGH
HIV-1 RNA Quant, Log: 1.36 Log cps/mL — ABNORMAL HIGH

## 2021-07-09 ENCOUNTER — Inpatient Hospital Stay: Admission: RE | Admit: 2021-07-09 | Payer: Managed Care, Other (non HMO) | Source: Ambulatory Visit

## 2021-07-09 ENCOUNTER — Other Ambulatory Visit: Payer: Self-pay | Admitting: Infectious Diseases

## 2021-07-09 DIAGNOSIS — B20 Human immunodeficiency virus [HIV] disease: Secondary | ICD-10-CM

## 2021-07-11 ENCOUNTER — Encounter: Payer: Self-pay | Admitting: Infectious Diseases

## 2021-07-11 DIAGNOSIS — J208 Acute bronchitis due to other specified organisms: Secondary | ICD-10-CM

## 2021-07-11 MED ORDER — AZITHROMYCIN 250 MG PO TABS: ORAL_TABLET | ORAL | 0 refills | Status: AC

## 2021-07-13 ENCOUNTER — Other Ambulatory Visit: Payer: Self-pay | Admitting: Infectious Diseases

## 2021-07-13 ENCOUNTER — Encounter: Payer: Self-pay | Admitting: Infectious Diseases

## 2021-07-13 DIAGNOSIS — J208 Acute bronchitis due to other specified organisms: Secondary | ICD-10-CM

## 2021-07-13 MED ORDER — BENZONATATE 100 MG PO CAPS: 100.0000 mg | ORAL_CAPSULE | Freq: Three times a day (TID) | ORAL | 0 refills | Status: DC | PRN

## 2021-07-13 NOTE — Telephone Encounter (Signed)
Called [facility] to transfer tessalon perles from [facility]. Location to [place].  ? ?Sandie Ano, RN ? ?

## 2021-07-13 NOTE — Telephone Encounter (Signed)
Patient called requesting a cough syrup that she can take at night. Has not had any improvement in cough symptoms. Will route to provider.  ? ?Sandie Ano, RN ? ?

## 2021-07-16 ENCOUNTER — Ambulatory Visit (HOSPITAL_COMMUNITY): Payer: Managed Care, Other (non HMO)

## 2021-07-25 DIAGNOSIS — Z0289 Encounter for other administrative examinations: Secondary | ICD-10-CM

## 2021-07-31 ENCOUNTER — Ambulatory Visit
Admission: RE | Admit: 2021-07-31 | Discharge: 2021-07-31 | Disposition: A | Discharge status: Home / Self Care | Payer: Managed Care, Other (non HMO) | Source: Ambulatory Visit | Attending: Infectious Diseases | Admitting: Infectious Diseases

## 2021-07-31 DIAGNOSIS — M79605 Pain in left leg: Secondary | ICD-10-CM

## 2021-07-31 IMAGING — MR MR PELVIS WO/W CM
5 of 8 series · 26 of 48 positions shown · IV contrast (20 ML MULTIHANCE)
Comparison: None.

CLINICAL DATA: Chronic back pain.  History of AVN

EXAM:
MRI PELVIS WITHOUT AND WITH CONTRAST
TECHNIQUE: Multiplanar multisequence MR imaging of the pelvis was performed
both before and after administration of intravenous contrast.
CONTRAST:  20mL MULTIHANCE GADOBENATE DIMEGLUMINE 529 MG/ML IV SOLN

[Series 4: T1 · coronal · 3.0mm · 0.78mm/px · 6 of 35 slices shown (1 of 2)]
[im 1/35]
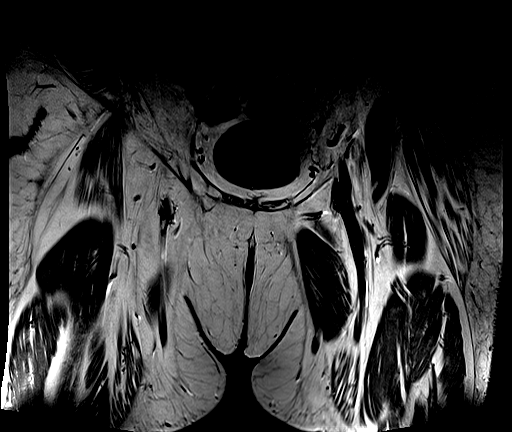
[im 7/35]
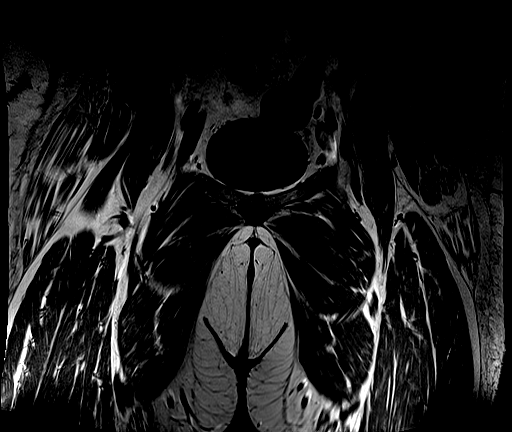
[im 14/35]
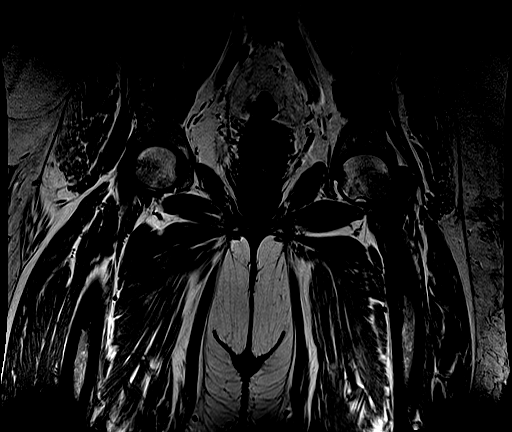
[im 21/35]
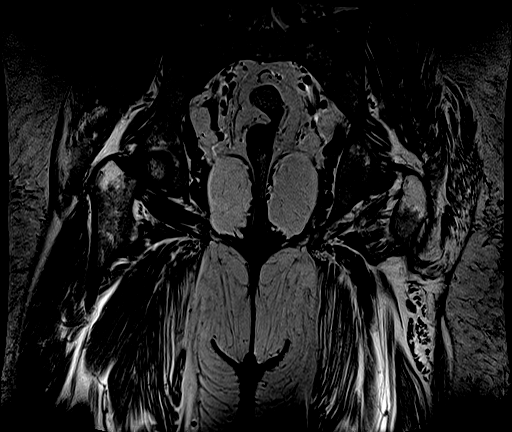
[im 28/35]
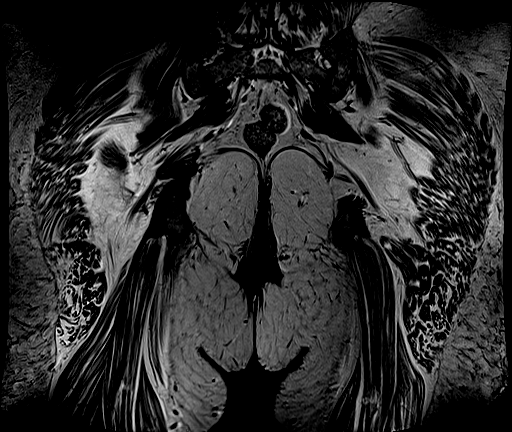
[im 35/35]
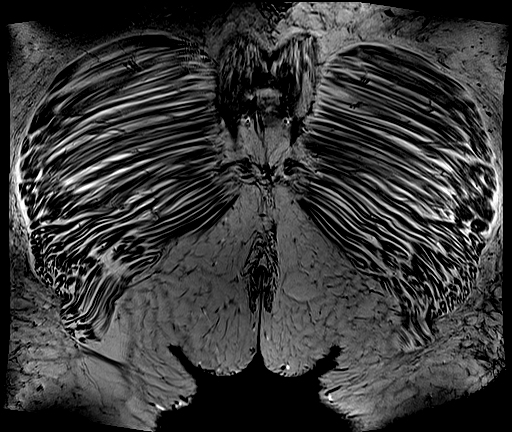

[Series 6: T1 · axial · 5.0mm · 0.78mm/px · z∈[+8,+211]mm · 6 of 30 slices shown (2 of 2)]
[im 1/30]
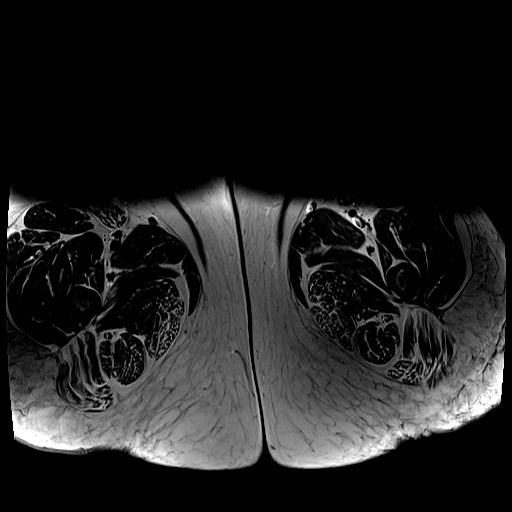
[im 6/30]
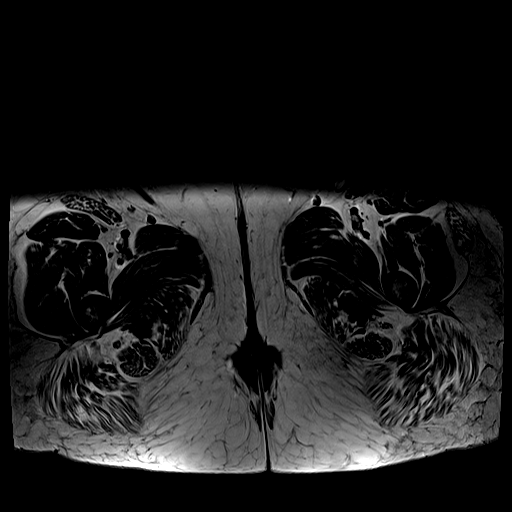
[im 12/30]
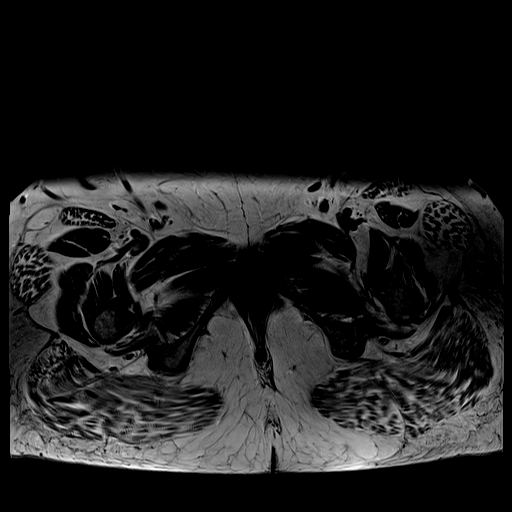
[im 18/30]
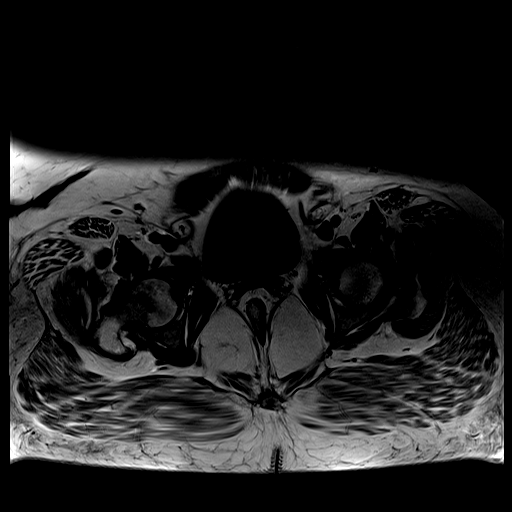
[im 24/30]
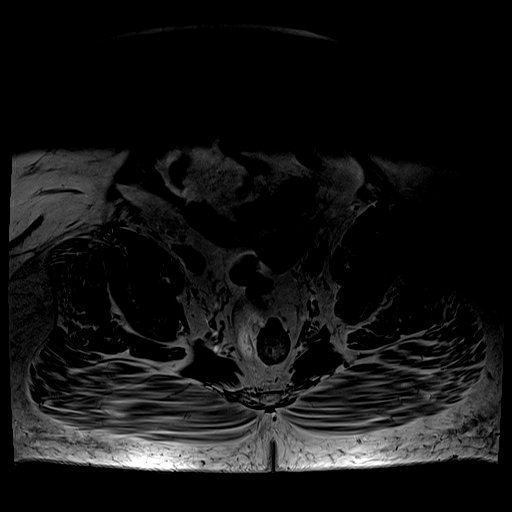
[im 30/30]
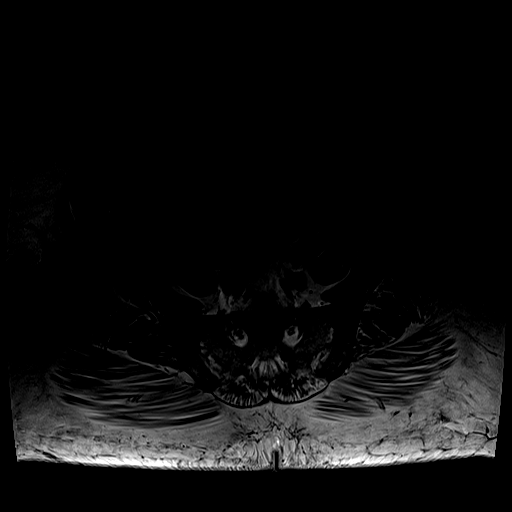

[Series 8: T1 fat-sat · axial · non-contrast · 5.0mm · 0.89mm/px · z∈[+8,+211]mm · 6 of 30 slices shown]
[im 1/30]
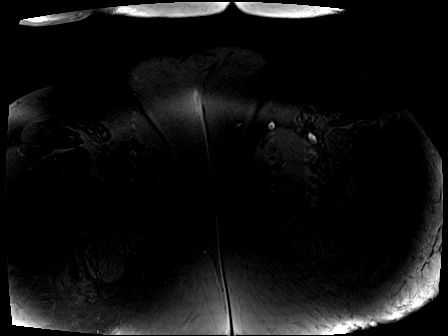
[im 6/30]
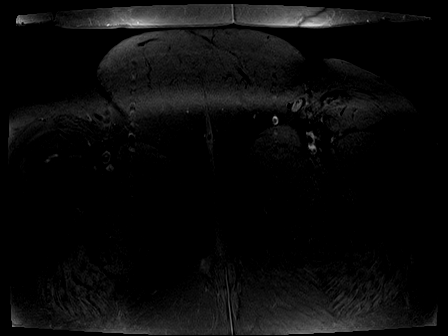
[im 12/30]
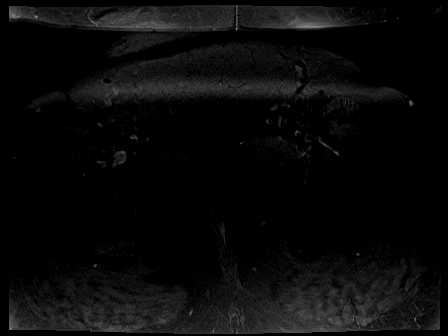
[im 18/30]
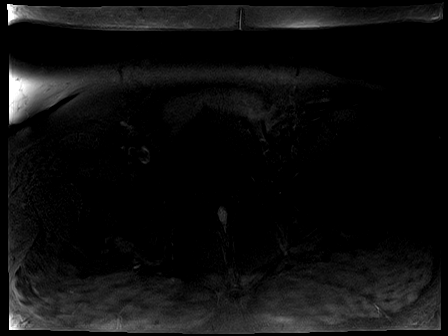
[im 24/30]
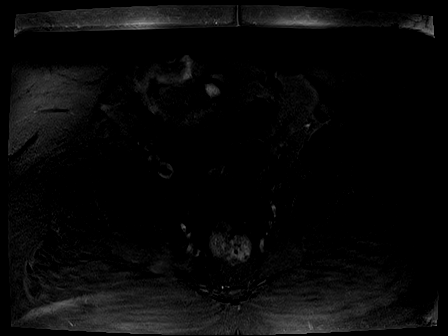
[im 30/30]
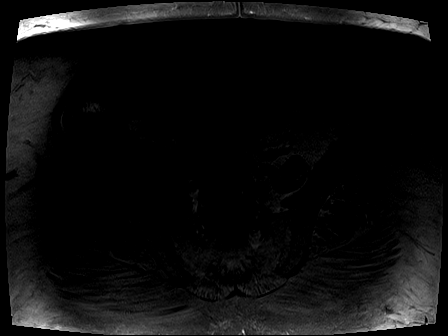

[Series 9: T1 fat-sat post-contrast · axial · 5.0mm · 0.89mm/px · z∈[+8,+211]mm · 6 of 30 slices shown (1 of 2)]
[im 1/30]
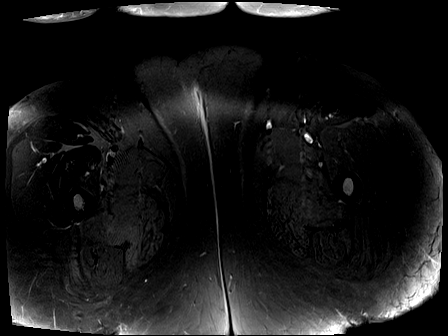
[im 6/30]
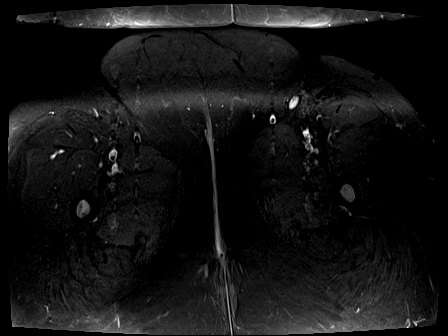
[im 12/30]
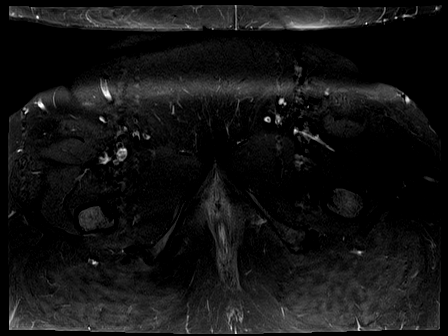
[im 18/30]
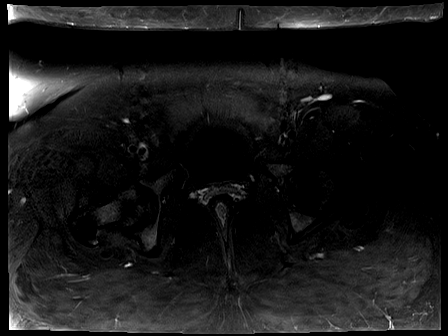
[im 24/30]
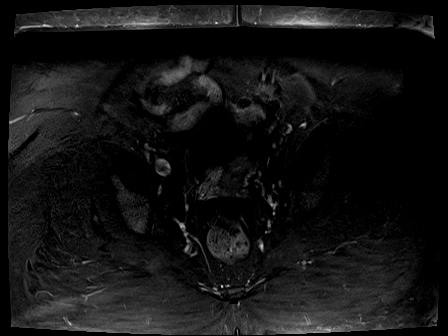
[im 30/30]
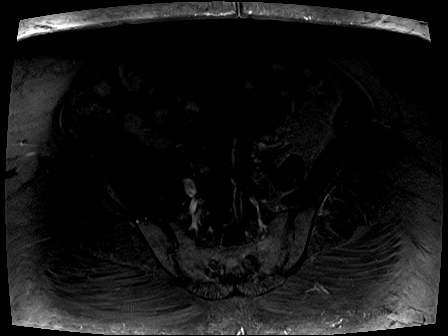

[Series 10: T1 fat-sat post-contrast · coronal · 3.0mm · 0.89mm/px · 2 of 35 slices shown (2 of 2)]
[im 1/35]
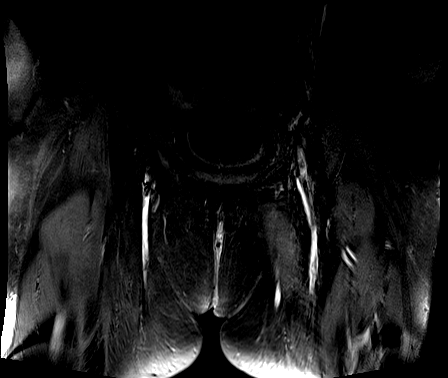
[im 7/35]
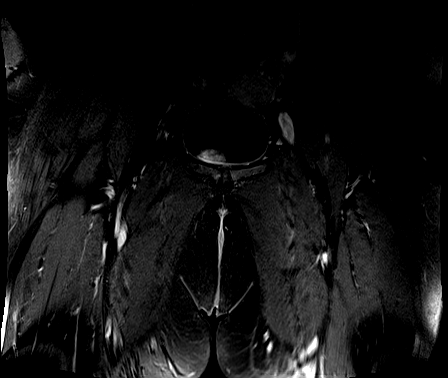

[26 of 48 positions shown; findings below may reference images not displayed]

FINDINGS: Bones/Joint/Cartilage

No acute fracture. No dislocation. No femoral head avascular
necrosis. Bilateral hip joints are intact without evidence of
significant arthropathy on large field-of-view images. No
subchondral marrow signal changes. No joint effusion. Bony pelvis
intact without diastasis. SI joints and pubic symphysis within
normal limits. No bone marrow edema. No marrow replacing bone
lesion.

Ligaments

Intact.

Muscles and Tendons

The gluteal, hamstring, iliopsoas, rectus femoris, and adductor
tendons appear intact without tear or significant tendinosis. Normal
muscle bulk and signal intensity without edema, atrophy, or fatty
infiltration.

Soft tissues

Mild generalized subcutaneous edema, nonspecific. No organized fluid
collections. No inguinal lymphadenopathy. No acute findings within
the pelvis.
IMPRESSION: 1. No acute osseous abnormality or evidence of avascular necrosis of
the bilateral hips or pelvis.
2. Mild generalized subcutaneous edema, nonspecific.

## 2021-07-31 MED ORDER — GADOBENATE DIMEGLUMINE 529 MG/ML IV SOLN
20.0000 mL | Freq: Once | INTRAVENOUS | Status: AC | PRN
  Administered 2021-07-31: 20 mL via INTRAVENOUS

## 2021-08-01 ENCOUNTER — Ambulatory Visit (INDEPENDENT_AMBULATORY_CARE_PROVIDER_SITE_OTHER): Payer: Managed Care, Other (non HMO) | Admitting: Family Medicine

## 2021-08-01 ENCOUNTER — Encounter (INDEPENDENT_AMBULATORY_CARE_PROVIDER_SITE_OTHER): Payer: Self-pay | Admitting: Family Medicine

## 2021-08-01 VITALS — BP 150/82 | HR 89 | Temp 98.0°F | Ht 63.0 in | Wt 268.0 lb

## 2021-08-01 DIAGNOSIS — I10 Essential (primary) hypertension: Secondary | ICD-10-CM | POA: Diagnosis not present

## 2021-08-01 DIAGNOSIS — Z6841 Body Mass Index (BMI) 40.0 and over, adult: Secondary | ICD-10-CM

## 2021-08-01 DIAGNOSIS — E668 Other obesity: Secondary | ICD-10-CM

## 2021-08-01 DIAGNOSIS — E782 Mixed hyperlipidemia: Secondary | ICD-10-CM

## 2021-08-01 DIAGNOSIS — R0602 Shortness of breath: Secondary | ICD-10-CM

## 2021-08-01 DIAGNOSIS — F32A Depression, unspecified: Secondary | ICD-10-CM

## 2021-08-01 DIAGNOSIS — R5383 Other fatigue: Secondary | ICD-10-CM | POA: Diagnosis not present

## 2021-08-01 DIAGNOSIS — E538 Deficiency of other specified B group vitamins: Secondary | ICD-10-CM

## 2021-08-01 DIAGNOSIS — F419 Anxiety disorder, unspecified: Secondary | ICD-10-CM

## 2021-08-01 DIAGNOSIS — E038 Other specified hypothyroidism: Secondary | ICD-10-CM

## 2021-08-01 DIAGNOSIS — B20 Human immunodeficiency virus [HIV] disease: Secondary | ICD-10-CM

## 2021-08-01 DIAGNOSIS — E282 Polycystic ovarian syndrome: Secondary | ICD-10-CM

## 2021-08-02 LAB — VITAMIN B12: Vitamin B-12: 319 pg/mL (ref 232–1245)

## 2021-08-02 LAB — HEMOGLOBIN A1C
Est. average glucose Bld gHb Est-mCnc: 146 mg/dL
Hgb A1c MFr Bld: 6.7 % — ABNORMAL HIGH (ref 4.8–5.6)

## 2021-08-02 LAB — INSULIN, RANDOM: INSULIN: 52.7 u[IU]/mL — ABNORMAL HIGH (ref 2.6–24.9)

## 2021-08-02 LAB — VITAMIN D 25 HYDROXY (VIT D DEFICIENCY, FRACTURES): Vit D, 25-Hydroxy: 16.3 ng/mL — ABNORMAL LOW (ref 30.0–100.0)

## 2021-08-02 LAB — T3: T3, Total: 159 ng/dL (ref 71–180)

## 2021-08-02 LAB — T4, FREE: Free T4: 1.03 ng/dL (ref 0.82–1.77)

## 2021-08-02 LAB — TSH: TSH: 2.55 u[IU]/mL (ref 0.450–4.500)

## 2021-08-02 LAB — FOLATE: Folate: 3 ng/mL — ABNORMAL LOW (ref >3.0)

## 2021-08-03 NOTE — Progress Notes (Signed)
? ? ? ? ?Chief Complaint:  ? ?OBESITY ?Lindsay French (MR# [id number]) is a 41 y.o. female who presents for evaluation and treatment of obesity and related comorbidities. Current BMI is Body mass index is 47.47 kg/m?Lindsay French. Lindsay French has been struggling with her weight for many years and has been unsuccessful in either losing weight, maintaining weight loss, or reaching her healthy weight goal. ? ?Lindsay French was referred by Dr. Ninetta LightsHatcher. [employer] delivery for supper 7 times per week. Breakfast in the [place]-leftover sausage and egg (not much). 11 AM-cheese stick. Lunch-1 chicken nugget, 3 fish sticks, spinach and cheese ravioli, cherry tomatoes,and fruit. Dinner is 2 slice of sourdough with brie;sandwich or burger (will eat 1/2).  ? ?Lindsay French is currently in the action stage of change and ready to dedicate time achieving and maintaining a healthier weight. Lindsay French is interested in becoming our patient and working on intensive lifestyle modifications including (but not limited to) diet and exercise for weight loss. ? ?Lindsay French's habits were reviewed today and are as follows: Her family eats meals together, she thinks her family will eat healthier with her, her desired weight loss is 138 lbs, she has been heavy most of her life, she started gaining weight in her 30's, her heaviest weight ever was 275 pounds, she has significant food cravings issues, she snacks frequently in the evenings, she skips meals frequently, she is frequently drinking liquids with calories, and she frequently makes poor food choices. ? ?Depression Screen ?Lindsay French's Food and Mood (modified PHQ-9) score was 16. ? ? ?  08/01/2021  ?  8:25 AM  ?Depression screen PHQ 2/9  ?Decreased Interest 3  ?Down, Depressed, Hopeless 2  ?PHQ - 2 Score 5  ?Altered sleeping 3  ?Tired, decreased energy 3  ?Change in appetite 1  ?Feeling bad or failure about yourself  1  ?Trouble concentrating 3  ?Moving slowly or fidgety/restless 0  ?Suicidal thoughts 0  ?PHQ-9 Score 16  ?Difficult doing  work/chores Very difficult  ? ?Subjective:  ? ?1. Other fatigue ?Lindsay French admits to daytime somnolence and admits to waking up still tired. Patient has a history of symptoms of daytime fatigue and morning fatigue. Lindsay French generally gets 5 or 6 hours of sleep per night, and states that she has nightime awakenings. Snoring is present. Apneic episodes are not present. Epworth Sleepiness Score is 6. EKG-NSR at 97 BPM.  ? ?2. SOBOE (shortness of breath on exertion) ?Lindsay French notes increasing shortness of breath with exercising and seems to be worsening over time with weight gain. She notes getting out of breath sooner with activity than she used to. This has not gotten worse recently. Lindsay French denies shortness of breath at rest or orthopnea. ? ?3. Hypertension, unspecified type ?Lindsay French's blood pressure is elevated today. She is on irbesartan, never been on other medications. She was diagnosed 4 years ago.  ? ?4. Human immunodeficiency virus (HIV) disease (HCC) ?Lindsay French is on [name] (previously on Prezista, Odefsey, Complera, Truvada, Norvir, Atripla). Last LFTs were slightly elevated.  ? ?5. Mixed hyperlipidemia ?Lindsay French's LDL was 170, HDL 49, and triglycerides 409221. She is not on medications. Significant elevated LDL and triglycerides for 4 years.  ? ?6. Vitamin B12 deficiency ?Lindsay French is not taking B12 supplementation, and she notes fatigue.  ? ?7. PCOS (polycystic ovarian syndrome) ?Lindsay French last period was in August 2022. ? ?8. Other specified hypothyroidism ?Lindsay French has a history of hypothyroidism. She denies cold/heat intolerance or palpitations.  ? ?9. Anxiety and depression ?Lindsay French is seeing Lonna CobbCara French at [facility].  She denies suicidal or homicidal ideation. (Previously  on Rexuiti but could no longer get due to cost).  ? ?Assessment/Plan:  ? ?1. Other fatigue ?Lindsay French does feel that her weight is causing her energy to be lower than it should be. Fatigue may be related to obesity, depression or many other causes. Labs will be  ordered, and in the meanwhile, Lindsay French will focus on self care including making healthy food choices, increasing physical activity and focusing on stress reduction. ? ?- EKG 12-Lead ?- VITAMIN D 25 Hydroxy (Vit-D Deficiency, Fractures) ?- Folate ?- T3 ?- T4, free ?- TSH ? ?2. SOBOE (shortness of breath on exertion) ?Lindsay French does feel that she gets out of breath more easily that she used to when she exercises. Lindsay French's shortness of breath appears to be obesity related and exercise induced. She has agreed to work on weight loss and gradually increase exercise to treat her exercise induced shortness of breath. Will continue to monitor closely. ? ?3. Hypertension, unspecified type ?EKG was done  today, and we will check labs today and will follow up at [facility] next appointment.  ? ?4. Human immunodeficiency virus (HIV) disease (HCC) ?Lindsay French will continue to follow up with Dr. Ninetta Lights.  ? ?5. Mixed hyperlipidemia ?We will discuss labs at [facility] next appointment.  ? ?6. Vitamin B12 deficiency ?We will check Vitamin B12 today, and will follow up at [facility] next appointment.  ? ?- Vitamin B12 ? ?7. PCOS (polycystic ovarian syndrome) ?We will check labs today. We will follow up on Lindsay French's menstrual cycle and plans at her next appointment.  ? ?- Hemoglobin A1c ?- Insulin, random ? ?8. Other specified hypothyroidism ?We will check labs today, and will follow up at [facility] next appointment.  ? ?- T3 ?- T4, free ?- TSH ? ?9. Anxiety and depression ?We will follow up on medications at [facility] next appointment.  ? ?10. Class 3 severe obesity with serious comorbidity and body mass index (BMI) of 45.0 to 49.9 in adult, unspecified obesity type (HCC) ?Lindsay French is currently in the action stage of change and her goal is to continue with weight loss efforts. I recommend Lindsay French begin the structured treatment plan as follows: ? ?She has agreed to the Category 4 Plan. ? ?Exercise goals: As is.  ? ?Behavioral modification strategies: increasing lean  protein intake, meal planning and cooking strategies, keeping healthy foods in the home, and planning for success. ? ?She was informed of the importance of frequent follow-up visits to maximize her success with intensive lifestyle modifications for her multiple health conditions. She was informed we would discuss her lab results at her next visit unless there is a critical issue that needs to be addressed sooner. Lindsay French agreed to keep her next visit at the agreed upon time to discuss these results. ? ?Objective:  ? ?Blood pressure (!) 150/82, pulse 89, temperature 98 ?F (36.7 ?C), height 5\' 3"  (1.6 m), weight 268 lb (121.6 kg), SpO2 100 %. Body mass index is 47.47 kg/m?. ? ?EKG: Normal sinus rhythm, rate 97 BPM. ? ?Indirect Calorimeter completed today shows a VO2 of 456 and a REE of 3154.  Her calculated basal metabolic rate is thus her basal metabolic rate is better than expected. ? ?General: Cooperative, alert, well developed, in no acute distress. ?HEENT: Conjunctivae and lids unremarkable. ?Cardiovascular: Regular rhythm.  ?Lungs: Normal work of breathing. ?Neurologic: No focal deficits.  ? ?Lab Results  ?Component Value Date  ? CREATININE 0.62 06/26/2021  ? BUN 4 (L) 06/26/2021  ?  NA 135 06/26/2021  ? K 4.1 06/26/2021  ? CL 96 (L) 06/26/2021  ? CO2 27 06/26/2021  ? ?Lab Results  ?Component Value Date  ? ALT 47 (H) 06/26/2021  ? AST 70 (H) 06/26/2021  ? ALKPHOS 135 (H) 11/09/2020  ? BILITOT 0.4 06/26/2021  ? ?Lab Results  ?Component Value Date  ? HGBA1C 6.7 (H) 08/01/2021  ? ?Lab Results  ?Component Value Date  ? INSULIN 52.7 (H) 08/01/2021  ? ?Lab Results  ?Component Value Date  ? TSH 2.550 08/01/2021  ? ?Lab Results  ?Component Value Date  ? CHOL 256 (H) 06/26/2021  ? HDL 49 (L) 06/26/2021  ? LDLCALC 170 (H) 06/26/2021  ? TRIG 221 (H) 06/26/2021  ? CHOLHDL 5.2 (H) 06/26/2021  ? ?Lab Results  ?Component Value Date  ? WBC 13.4 (H) 06/26/2021  ? HGB 12.6 06/26/2021  ? HCT 38.1 06/26/2021  ? MCV 93.6  06/26/2021  ? PLT 327 06/26/2021  ? ?No results found for: IRON, TIBC, FERRITIN ? ?Attestation Statements:  ? ?Reviewed by clinician on day of visit: allergies, medications, problem list, medical history, surgical his

## 2021-08-15 ENCOUNTER — Ambulatory Visit (INDEPENDENT_AMBULATORY_CARE_PROVIDER_SITE_OTHER): Payer: Managed Care, Other (non HMO) | Admitting: Family Medicine

## 2021-08-15 ENCOUNTER — Encounter (INDEPENDENT_AMBULATORY_CARE_PROVIDER_SITE_OTHER): Payer: Self-pay | Admitting: Family Medicine

## 2021-08-15 VITALS — BP 113/74 | HR 100 | Temp 97.9°F | Ht 63.0 in | Wt 266.0 lb

## 2021-08-15 DIAGNOSIS — E669 Obesity, unspecified: Secondary | ICD-10-CM

## 2021-08-15 DIAGNOSIS — I152 Hypertension secondary to endocrine disorders: Secondary | ICD-10-CM

## 2021-08-15 DIAGNOSIS — E538 Deficiency of other specified B group vitamins: Secondary | ICD-10-CM

## 2021-08-15 DIAGNOSIS — E1159 Type 2 diabetes mellitus with other circulatory complications: Secondary | ICD-10-CM | POA: Diagnosis not present

## 2021-08-15 DIAGNOSIS — E1165 Type 2 diabetes mellitus with hyperglycemia: Secondary | ICD-10-CM

## 2021-08-15 DIAGNOSIS — E785 Hyperlipidemia, unspecified: Secondary | ICD-10-CM

## 2021-08-15 DIAGNOSIS — Z7985 Long-term (current) use of injectable non-insulin antidiabetic drugs: Secondary | ICD-10-CM

## 2021-08-15 DIAGNOSIS — E1169 Type 2 diabetes mellitus with other specified complication: Secondary | ICD-10-CM

## 2021-08-15 DIAGNOSIS — E559 Vitamin D deficiency, unspecified: Secondary | ICD-10-CM

## 2021-08-15 DIAGNOSIS — Z6841 Body Mass Index (BMI) 40.0 and over, adult: Secondary | ICD-10-CM

## 2021-08-15 DIAGNOSIS — Z9189 Other specified personal risk factors, not elsewhere classified: Secondary | ICD-10-CM

## 2021-08-15 MED ORDER — ATORVASTATIN CALCIUM 10 MG PO TABS: 10.0000 mg | ORAL_TABLET | Freq: Every day | ORAL | 0 refills | Status: DC

## 2021-08-15 MED ORDER — OZEMPIC (0.25 OR 0.5 MG/DOSE) 2 MG/1.5ML ~~LOC~~ SOPN: 0.2500 mg | PEN_INJECTOR | SUBCUTANEOUS | 0 refills | Status: DC

## 2021-08-15 MED ORDER — VITAMIN D (ERGOCALCIFEROL) 1.25 MG (50000 UNIT) PO CAPS: 50000.0000 [IU] | ORAL_CAPSULE | ORAL | 0 refills | Status: DC

## 2021-08-23 ENCOUNTER — Encounter (INDEPENDENT_AMBULATORY_CARE_PROVIDER_SITE_OTHER): Payer: Self-pay | Admitting: Family Medicine

## 2021-08-24 NOTE — Telephone Encounter (Signed)
Please check PA, Thanks

## 2021-08-24 NOTE — Telephone Encounter (Signed)
Prior authorization has already been started for Ozempic. Will notify patient and provider once response is received.  ?

## 2021-08-25 DIAGNOSIS — I152 Hypertension secondary to endocrine disorders: Secondary | ICD-10-CM | POA: Insufficient documentation

## 2021-08-25 DIAGNOSIS — E538 Deficiency of other specified B group vitamins: Secondary | ICD-10-CM | POA: Insufficient documentation

## 2021-08-25 DIAGNOSIS — E1169 Type 2 diabetes mellitus with other specified complication: Secondary | ICD-10-CM | POA: Insufficient documentation

## 2021-08-25 DIAGNOSIS — E1165 Type 2 diabetes mellitus with hyperglycemia: Secondary | ICD-10-CM | POA: Insufficient documentation

## 2021-08-25 DIAGNOSIS — Z9189 Other specified personal risk factors, not elsewhere classified: Secondary | ICD-10-CM | POA: Insufficient documentation

## 2021-08-25 DIAGNOSIS — E559 Vitamin D deficiency, unspecified: Secondary | ICD-10-CM | POA: Insufficient documentation

## 2021-08-25 NOTE — Progress Notes (Signed)
Chief Complaint:   OBESITY Lindsay French is here to discuss her progress with her obesity treatment plan along with follow-up of her obesity related diagnoses. Lindsay French is on the Category 4 Plan and states she is following her eating plan approximately 60% of the time. Lindsay French states she is not exercising 0 minutes 0 times per week.  Today's visit was #: 2 Starting weight: 268 lbs Starting date: 08/01/2021 Today's weight: 266 lbs Today's date: 08/25/2021 Total lbs lost to date: 2 Total lbs lost since last in-office visit: 2  Interim History: Lindsay French voices that first few weeks were very difficult.  She did not realize how much of a food eating disorder she still has.  She is still noticing room for improvement with ordering groceries and incorporating eating out.  Lindsay French is eating quite a bit of cottage cheese, her birthday is in two days.   Subjective:   1. Folate deficiency Lindsay French is not on a multivitamin, her folate level was 3.0.  2. Vitamin D deficiency Lindsay French is not on Vitamin D, but complains of fatigue.  Vitamin D level is 16.3  3. Hyperlipidemia associated with type 2 diabetes mellitus (HCC) Lindsay French's LDL was 170, Triglycerides 221, HDL 49.  She is currently not on a statin. 10 year (ASCVD)  Atherosclerotic cardiovascular disease, risk.2.4%. (optimal 0.4%). Lindsay French does have transaminitis  4. Type 2 diabetes mellitus with hyperglycemia, without long-term current use of insulin (HCC) New Diagnosis, previously on Metformin.  Lindsay French's A1c was 6.7, Insulin was 52.7. Discussed  labs with patient today. Lindsay French is currently not on any medications.  5. Hypertension associated with type 2 diabetes mellitus (HCC) Lindsay French's blood pressure was well controlled today, has been on Avapro.  6. At risk for hyperglycemia Lindsay French has a history of some elevated blood glucose readings without a diagnosis of diabetes. She  endorses  polyphagia.   Assessment/Plan:   1. Folate deficiency Lindsay French has agreed to start  taking a multivitamin.   2. Vitamin D deficiency Lindsay French has agreed to start prescription Vitamin D.  See below.  - Vitamin D, Ergocalciferol, (DRISDOL) 1.25 MG (50000 UNIT) CAPS capsule; Take 1 capsule (50,000 Units total) by mouth every 7 (seven) days.  Dispense: 4 capsule; Refill: 0  3. Hyperlipidemia associated with type 2 diabetes mellitus (HCC) Lindsay French has agreed to start Atorvastatin 10 mg daily. See below. We will check labs CMP and FLP in 3 months.    - atorvastatin (LIPITOR) 10 MG tablet; Take 1 tablet (10 mg total) by mouth daily.  Dispense: 30 tablet; Refill: 0  4. Type 2 diabetes mellitus with hyperglycemia, without long-term current use of insulin (HCC) Lindsay French has agreed to start Ozempic.  See below.  - Semaglutide,0.25 or 0.5MG /DOS, (OZEMPIC, 0.25 OR 0.5 MG/DOSE,) 2 MG/1.5ML SOPN; Inject 0.25 mg into the skin once a week.  Dispense: 1.5 mL; Refill: 0  5. Hypertension associated with type 2 diabetes mellitus (HCC) Lindsay French has agreed to  continue current medications, will follow up with blood pressure at next appointment.   6. At risk for hyperglycemia Lindsay French was given approximately 15 minutes of counseling today regarding prevention of hyperglycemia. She was advised of hyperglycemia causes and the fact hyperglycemia is often asymptomatic. Lindsay French was instructed to avoid skipping meals, eat regular protein rich meals and schedule low calorie but protein rich snacks as needed.   Repetitive spaced learning was employed today to elicit superior memory formation and behavioral change   7. Obesity with current BMI of 47.2 Lindsay French is  currently in the action stage of change. As such, her goal is to continue with weight loss efforts. She has agreed to the Category 4 Plan.   Exercise goals: No exercise has been prescribed at this time.  Behavioral modification strategies: increasing lean protein intake, meal planning and cooking strategies, keeping healthy foods in the home, and planning for  success.  Lindsay French has agreed to follow-up with our clinic in 3 weeks. She was informed of the importance of frequent follow-up visits to maximize her success with intensive lifestyle modifications for her multiple health conditions.  Objective:   Blood pressure 113/74, pulse 100, temperature 97.9 F (36.6 C), height 5\' 3"  (1.6 m), weight 266 lb (120.7 kg), SpO2 96 %. Body mass index is 47.12 kg/m.  General: Cooperative, alert, well developed, in no acute distress. HEENT: Conjunctivae and lids unremarkable. Cardiovascular: Regular rhythm.  Lungs: Normal work of breathing. Neurologic: No focal deficits.   Lab Results  Component Value Date   CREATININE 0.62 06/26/2021   BUN 4 (L) 06/26/2021   NA 135 06/26/2021   K 4.1 06/26/2021   CL 96 (L) 06/26/2021   CO2 27 06/26/2021   Lab Results  Component Value Date   ALT 47 (H) 06/26/2021   AST 70 (H) 06/26/2021   ALKPHOS 135 (H) 11/09/2020   BILITOT 0.4 06/26/2021   Lab Results  Component Value Date   HGBA1C 6.7 (H) 08/01/2021   Lab Results  Component Value Date   INSULIN 52.7 (H) 08/01/2021   Lab Results  Component Value Date   TSH 2.550 08/01/2021   Lab Results  Component Value Date   CHOL 256 (H) 06/26/2021   HDL 49 (L) 06/26/2021   LDLCALC 170 (H) 06/26/2021   TRIG 221 (H) 06/26/2021   CHOLHDL 5.2 (H) 06/26/2021   Lab Results  Component Value Date   VD25OH 16.3 (L) 08/01/2021   Lab Results  Component Value Date   WBC 13.4 (H) 06/26/2021   HGB 12.6 06/26/2021   HCT 38.1 06/26/2021   MCV 93.6 06/26/2021   PLT 327 06/26/2021   No results found for: IRON, TIBC, FERRITIN  Attestation Statements:   Reviewed by clinician on day of visit: allergies, medications, problem list, medical history, surgical history, family history, social history, and previous encounter notes.  I, 08/26/2021, am acting as transcriptionist for Malcolm Metro, MD.  I have reviewed the above documentation for accuracy and  completeness, and I agree with the above. - Reuben Likes, MD

## 2021-08-28 ENCOUNTER — Encounter (INDEPENDENT_AMBULATORY_CARE_PROVIDER_SITE_OTHER): Payer: Self-pay

## 2021-08-28 ENCOUNTER — Telehealth (INDEPENDENT_AMBULATORY_CARE_PROVIDER_SITE_OTHER): Payer: Self-pay | Admitting: Family Medicine

## 2021-08-28 NOTE — Telephone Encounter (Signed)
Dr. Lawson Radar - Prior authorization was started for patient in covermymeds on 08/16/21. As of today we have not received a response from the insurance company. It has been 12 days since request was submitted. I called patient insurance company [employer] and spent 46 minutes on hold until a representative picked up. Per representative prior authorization is still being processed and they are behind. Case ID# [id number]. Patient sent message in [place].  ?  ?

## 2021-09-04 ENCOUNTER — Encounter (INDEPENDENT_AMBULATORY_CARE_PROVIDER_SITE_OTHER): Payer: Self-pay

## 2021-09-04 ENCOUNTER — Telehealth (INDEPENDENT_AMBULATORY_CARE_PROVIDER_SITE_OTHER): Payer: Self-pay | Admitting: Family Medicine

## 2021-09-04 NOTE — Telephone Encounter (Signed)
Dr. Jearld Shines - Prior authorization denied for Ozempic. Per insurance: There is nothing to support that the individual has had failure, contraindication, or intolerance to the  ?covered alternative, Trulicity. Patient sent denial message via mychart.  ? ?

## 2021-09-05 ENCOUNTER — Other Ambulatory Visit (INDEPENDENT_AMBULATORY_CARE_PROVIDER_SITE_OTHER): Payer: Self-pay | Admitting: Family Medicine

## 2021-09-05 DIAGNOSIS — E1165 Type 2 diabetes mellitus with hyperglycemia: Secondary | ICD-10-CM

## 2021-09-05 MED ORDER — TRULICITY 0.75 MG/0.5ML ~~LOC~~ SOAJ: 0.7500 mg | SUBCUTANEOUS | 0 refills | Status: DC

## 2021-09-05 NOTE — Telephone Encounter (Signed)
Pt notified-CS

## 2021-09-05 NOTE — Telephone Encounter (Signed)
Patient states she just talked to her insurance and was told about message below patient would like to see if Dr. Lawson Radar would send in Trulicity. ?

## 2021-09-05 NOTE — Telephone Encounter (Signed)
Please advise 

## 2021-09-05 NOTE — Telephone Encounter (Signed)
Trulicity sent in

## 2021-09-12 ENCOUNTER — Ambulatory Visit (INDEPENDENT_AMBULATORY_CARE_PROVIDER_SITE_OTHER): Payer: Commercial Managed Care - HMO | Admitting: Family Medicine

## 2021-09-12 ENCOUNTER — Encounter (INDEPENDENT_AMBULATORY_CARE_PROVIDER_SITE_OTHER): Payer: Self-pay | Admitting: Family Medicine

## 2021-09-12 VITALS — BP 148/84 | HR 102 | Temp 98.4°F | Ht 63.0 in | Wt 276.0 lb

## 2021-09-12 DIAGNOSIS — E785 Hyperlipidemia, unspecified: Secondary | ICD-10-CM

## 2021-09-12 DIAGNOSIS — Z6841 Body Mass Index (BMI) 40.0 and over, adult: Secondary | ICD-10-CM

## 2021-09-12 DIAGNOSIS — E1169 Type 2 diabetes mellitus with other specified complication: Secondary | ICD-10-CM | POA: Diagnosis not present

## 2021-09-12 DIAGNOSIS — E1165 Type 2 diabetes mellitus with hyperglycemia: Secondary | ICD-10-CM | POA: Diagnosis not present

## 2021-09-12 DIAGNOSIS — Z9189 Other specified personal risk factors, not elsewhere classified: Secondary | ICD-10-CM

## 2021-09-12 DIAGNOSIS — I152 Hypertension secondary to endocrine disorders: Secondary | ICD-10-CM

## 2021-09-12 DIAGNOSIS — E669 Obesity, unspecified: Secondary | ICD-10-CM

## 2021-09-12 DIAGNOSIS — E1159 Type 2 diabetes mellitus with other circulatory complications: Secondary | ICD-10-CM

## 2021-09-12 DIAGNOSIS — E559 Vitamin D deficiency, unspecified: Secondary | ICD-10-CM

## 2021-09-12 DIAGNOSIS — Z7985 Long-term (current) use of injectable non-insulin antidiabetic drugs: Secondary | ICD-10-CM

## 2021-09-12 MED ORDER — VITAMIN D (ERGOCALCIFEROL) 1.25 MG (50000 UNIT) PO CAPS: 50000.0000 [IU] | ORAL_CAPSULE | ORAL | 0 refills | Status: DC

## 2021-09-12 MED ORDER — ATORVASTATIN CALCIUM 10 MG PO TABS: 10.0000 mg | ORAL_TABLET | Freq: Every day | ORAL | 0 refills | Status: DC

## 2021-09-12 MED ORDER — IRBESARTAN 150 MG PO TABS: 150.0000 mg | ORAL_TABLET | Freq: Every evening | ORAL | 0 refills | Status: DC

## 2021-09-13 ENCOUNTER — Encounter: Payer: Self-pay | Admitting: Infectious Diseases

## 2021-09-20 DIAGNOSIS — Z9189 Other specified personal risk factors, not elsewhere classified: Secondary | ICD-10-CM | POA: Insufficient documentation

## 2021-09-20 NOTE — Progress Notes (Signed)
Chief Complaint:   OBESITY Lindsay French is here to discuss her progress with her obesity treatment plan along with follow-up of her obesity related diagnoses. Lindsay French is on the Category 4 Plan and states she is following her eating plan approximately 60% of the time. 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  Today's visit was #: 3 Starting weight: 268 lbs Starting date: 08/01/2021 Today's weight: 276 lbs Today's date: 09/12/2021 Total lbs lost to date: 0 Total lbs lost since last in-office visit: 0  Interim History: Lindsay French has got back on Rexulti and has not been taking Xanax as consistently.  Started Trulicity last week.  She weighed herself between appointments and noticed weight gain, so stopped following meal plan.  No plans for [facility].  Does not want to weigh between appointments.  Subjective:   1. Type 2 diabetes mellitus with hyperglycemia, without long-term current use of insulin (HCC) Lindsay French finally got Trulicity; she started last week on 0.75 mg and is doing well.   2. Hyperlipidemia associated with type 2 diabetes mellitus (HCC) Lindsay French is on Lipitor 10 mg daily, no myalgias, but some transaminitis.   3. Vitamin D deficiency  Lindsay French is on prescription Vitamin D, denies any nausea, vomiting, or muscle weakness, but complains of fatigue.   4. Hypertension associated with type 2 diabetes mellitus (HCC) Lindsay French is on Avapro but she ran out in last few days.  She denies any chest pain, chest pressure or headaches.  5. At risk for heart disease Lindsay French is at higher than average risk for cardiovascular disease due to obesity.  Assessment/Plan:   1. Type 2 diabetes mellitus with hyperglycemia, without long-term current use of insulin (HCC) Continue Trulicity, no change in dose.   2. Hyperlipidemia associated with type 2 diabetes mellitus (HCC) Refill Lipitor 10 mg daily #30, no refills.  See below.   - atorvastatin (LIPITOR) 10 MG tablet; Take 1  tablet (10 mg total) by mouth daily.  Dispense: 30 tablet; Refill: 0  3. Vitamin D deficiency Refill Vitamin D 50,000 IU weekly #4, no refills - Vitamin D, Ergocalciferol, (DRISDOL) 1.25 MG (50000 UNIT) CAPS capsule; Take 1 capsule (50,000 Units total) by mouth every 7 (seven) days.  Dispense: 4 capsule; Refill: 0  4. Hypertension associated with type 2 diabetes mellitus (HCC) Refill Avapro 150 mg daily, #30, no refills. See below.  - irbesartan (AVAPRO) 150 MG tablet; Take 1 tablet (150 mg total) by mouth every evening.  Dispense: 30 tablet; Refill: 0  5. At risk for heart disease Lindsay French was given approximately 15 minutes of coronary artery disease prevention counseling today. She is 41 y.o. female and has risk factors for heart disease including obesity. We discussed intensive lifestyle modifications today with an emphasis on specific weight loss instructions and strategies.  Repetitive spaced learning was employed today to elicit superior memory formation and behavioral change.    6. Obesity with current BMI of 49.0 Lindsay French is currently in the action stage of change. As such, her goal is to continue with weight loss efforts. She has agreed to the Category 4 Plan.   Exercise goals:  As is.   Behavioral modification strategies: increasing lean protein intake, meal planning and cooking strategies, keeping healthy foods in the home, and planning for success.  Lindsay French has agreed to follow-up with our clinic in 4 weeks. She was informed of the importance of frequent follow-up visits to maximize her success with intensive lifestyle modifications  for her multiple health conditions.   Objective:   Blood pressure (!) 148/84, pulse (!) 102, temperature 98.4 F (36.9 C), height 5\' 3"  (1.6 m), weight 276 lb (125.2 kg), SpO2 96 %. Body mass index is 48.89 kg/m.  General: Cooperative, alert, well developed, in no acute distress. HEENT: Conjunctivae and lids unremarkable. Cardiovascular: Regular  rhythm.  Lungs: Normal work of breathing. Neurologic: No focal deficits.   Lab Results  Component Value Date   CREATININE 0.62 06/26/2021   BUN 4 (L) 06/26/2021   NA 135 06/26/2021   K 4.1 06/26/2021   CL 96 (L) 06/26/2021   CO2 27 06/26/2021   Lab Results  Component Value Date   ALT 47 (H) 06/26/2021   AST 70 (H) 06/26/2021   ALKPHOS 135 (H) 11/09/2020   BILITOT 0.4 06/26/2021   Lab Results  Component Value Date   HGBA1C 6.7 (H) 08/01/2021   Lab Results  Component Value Date   INSULIN 52.7 (H) 08/01/2021   Lab Results  Component Value Date   TSH 2.550 08/01/2021   Lab Results  Component Value Date   CHOL 256 (H) 06/26/2021   HDL 49 (L) 06/26/2021   LDLCALC 170 (H) 06/26/2021   TRIG 221 (H) 06/26/2021   CHOLHDL 5.2 (H) 06/26/2021   Lab Results  Component Value Date   VD25OH 16.3 (L) 08/01/2021   Lab Results  Component Value Date   WBC 13.4 (H) 06/26/2021   HGB 12.6 06/26/2021   HCT 38.1 06/26/2021   MCV 93.6 06/26/2021   PLT 327 06/26/2021   No results found for: IRON, TIBC, FERRITIN  Attestation Statements:   Reviewed by clinician on day of visit: allergies, medications, problem list, medical history, surgical history, family history, social history, and previous encounter notes.  I, 08/26/2021, RMA, am acting as transcriptionist for Malcolm Metro, MD.  I have reviewed the above documentation for accuracy and completeness, and I agree with the above. - Reuben Likes, MD

## 2021-10-02 ENCOUNTER — Encounter (INDEPENDENT_AMBULATORY_CARE_PROVIDER_SITE_OTHER): Payer: Self-pay | Admitting: Family Medicine

## 2021-10-02 ENCOUNTER — Ambulatory Visit (INDEPENDENT_AMBULATORY_CARE_PROVIDER_SITE_OTHER): Payer: Commercial Managed Care - HMO | Admitting: Family Medicine

## 2021-10-02 VITALS — BP 139/88 | HR 112 | Temp 98.1°F | Ht 63.0 in | Wt 282.0 lb

## 2021-10-02 DIAGNOSIS — Z6841 Body Mass Index (BMI) 40.0 and over, adult: Secondary | ICD-10-CM

## 2021-10-02 DIAGNOSIS — E1159 Type 2 diabetes mellitus with other circulatory complications: Secondary | ICD-10-CM

## 2021-10-02 DIAGNOSIS — E669 Obesity, unspecified: Secondary | ICD-10-CM | POA: Diagnosis not present

## 2021-10-02 DIAGNOSIS — I152 Hypertension secondary to endocrine disorders: Secondary | ICD-10-CM

## 2021-10-02 DIAGNOSIS — E1165 Type 2 diabetes mellitus with hyperglycemia: Secondary | ICD-10-CM | POA: Diagnosis not present

## 2021-10-02 DIAGNOSIS — E66813 Obesity, class 3: Secondary | ICD-10-CM

## 2021-10-02 DIAGNOSIS — Z9189 Other specified personal risk factors, not elsewhere classified: Secondary | ICD-10-CM

## 2021-10-02 DIAGNOSIS — Z7985 Long-term (current) use of injectable non-insulin antidiabetic drugs: Secondary | ICD-10-CM

## 2021-10-02 MED ORDER — TRULICITY 0.75 MG/0.5ML ~~LOC~~ SOAJ: 0.7500 mg | SUBCUTANEOUS | 0 refills | Status: DC

## 2021-10-04 NOTE — Progress Notes (Unsigned)
Chief Complaint:   OBESITY Lindsay French is here to discuss her progress with her obesity treatment plan along with follow-up of her obesity related diagnoses. [place] is on the Category 4 Plan and states she is following her eating plan approximately 60% of the time. Lindsay French states she is exercising 0 minutes 0 times per week.  Today's visit was #: 4 Starting weight: 268 lbs Starting date: 08/01/2021 Today's weight: 282 lbs Today's date: 10/02/2021 Total lbs lost to date: 0 lbs Total lbs lost since last in-office visit: 0  Interim History: Lindsay French is struggling to get all food in during the day. She really struggles to create time to eat all the food and prep. She ate 2 eggs, 2 slices of [employer] killer bread with part of a yogurt and 1 stripe of bacon. For lunch, tortilla with rice & beans. For supper with be [place] burger ( most likely will be 1/2 [place] burger and side salad).  Subjective:   1. Hypertension associated with type 2 diabetes mellitus (HCC) Lindsay French's blood pressure is well controlled today but higher end of normal. Denies chest pain, chest pressure and headache.  2. Type 2 diabetes mellitus with hyperglycemia, without long-term current use of insulin (HCC) Lindsay French is currently on Trulicity 0.75 mg with no GI side effects. Poor hunger cues even prior to GLP-1.  3. At risk for deficient intake of food The patient is at a higher than average risk of deficient intake of food due to medication.  Assessment/Plan:   1. Hypertension associated with type 2 diabetes mellitus (HCC) Stevey will continue taking Irbesartan with no changes in dose.  2. Type 2 diabetes mellitus with hyperglycemia, without long-term current use of insulin (HCC) We will refill Trulicity 0.75 mg once weekly for 1 month with 0 refills.  -Refill Dulaglutide (TRULICITY) 0.75 MG/0.5ML SOPN; Inject 0.75 mg into the skin once a week.  Dispense: 2 mL; Refill: 0  3. At risk for deficient intake of food Lindsay French was given  approximately 15 minutes of deficient intake of food prevention counseling today. Lindsay French is at risk for eating too few calories based on current food recall. She was encouraged to focus on meeting caloric and protein goals according to her recommended meal plan.  4. Obesity with current BMI of 50.1 Lindsay French is currently in the action stage of change. As such, her goal is to continue with weight loss efforts. She has agreed to the [employer].   Exercise goals: No exercise has been prescribed at this time.  Behavioral modification strategies: increasing lean protein intake, meal planning and cooking strategies, keeping healthy foods in the home, and planning for success.  Lindsay French has agreed to follow-up with our clinic in 3 weeks. She was informed of the importance of frequent follow-up visits to maximize her success with intensive lifestyle modifications for her multiple health conditions.   Objective:   Blood pressure 139/88, pulse (!) 112, temperature 98.1 F (36.7 C), height 5\' 3"  (1.6 m), weight 282 lb (127.9 kg), SpO2 95 %. Body mass index is 49.95 kg/m.  General: Cooperative, alert, well developed, in no acute distress. HEENT: Conjunctivae and lids unremarkable. Cardiovascular: Regular rhythm.  Lungs: Normal work of breathing. Neurologic: No focal deficits.   Lab Results  Component Value Date   CREATININE 0.62 06/26/2021   BUN 4 (L) 06/26/2021   NA 135 06/26/2021   K 4.1 06/26/2021   CL 96 (L) 06/26/2021   CO2 27 06/26/2021   Lab Results  Component  Value Date   ALT 47 (H) 06/26/2021   AST 70 (H) 06/26/2021   ALKPHOS 135 (H) 11/09/2020   BILITOT 0.4 06/26/2021   Lab Results  Component Value Date   HGBA1C 6.7 (H) 08/01/2021   Lab Results  Component Value Date   INSULIN 52.7 (H) 08/01/2021   Lab Results  Component Value Date   TSH 2.550 08/01/2021   Lab Results  Component Value Date   CHOL 256 (H) 06/26/2021   HDL 49 (L) 06/26/2021   LDLCALC 170 (H)  06/26/2021   TRIG 221 (H) 06/26/2021   CHOLHDL 5.2 (H) 06/26/2021   Lab Results  Component Value Date   VD25OH 16.3 (L) 08/01/2021   Lab Results  Component Value Date   WBC 13.4 (H) 06/26/2021   HGB 12.6 06/26/2021   HCT 38.1 06/26/2021   MCV 93.6 06/26/2021   PLT 327 06/26/2021   No results found for: "IRON", "TIBC", "FERRITIN"  Attestation Statements:   Reviewed by clinician on day of visit: allergies, medications, problem list, medical history, surgical history, family history, social history, and previous encounter notes.  I, Fortino Sic, RMA am acting as transcriptionist for Reuben Likes, MD.  I have reviewed the above documentation for accuracy and completeness, and I agree with the above. -  ***

## 2021-10-23 ENCOUNTER — Encounter (INDEPENDENT_AMBULATORY_CARE_PROVIDER_SITE_OTHER): Payer: Self-pay

## 2021-10-23 ENCOUNTER — Ambulatory Visit (INDEPENDENT_AMBULATORY_CARE_PROVIDER_SITE_OTHER): Payer: Commercial Managed Care - HMO | Admitting: Family Medicine

## 2021-10-24 ENCOUNTER — Other Ambulatory Visit (INDEPENDENT_AMBULATORY_CARE_PROVIDER_SITE_OTHER): Payer: Self-pay | Admitting: Family Medicine

## 2021-10-24 DIAGNOSIS — E1159 Type 2 diabetes mellitus with other circulatory complications: Secondary | ICD-10-CM

## 2021-10-24 DIAGNOSIS — E1165 Type 2 diabetes mellitus with hyperglycemia: Secondary | ICD-10-CM

## 2021-10-29 ENCOUNTER — Other Ambulatory Visit (INDEPENDENT_AMBULATORY_CARE_PROVIDER_SITE_OTHER): Payer: Self-pay | Admitting: Family Medicine

## 2021-10-29 DIAGNOSIS — I152 Hypertension secondary to endocrine disorders: Secondary | ICD-10-CM

## 2021-10-29 DIAGNOSIS — E1165 Type 2 diabetes mellitus with hyperglycemia: Secondary | ICD-10-CM

## 2021-10-31 ENCOUNTER — Encounter (INDEPENDENT_AMBULATORY_CARE_PROVIDER_SITE_OTHER): Payer: Self-pay | Admitting: Family Medicine

## 2021-10-31 ENCOUNTER — Ambulatory Visit (INDEPENDENT_AMBULATORY_CARE_PROVIDER_SITE_OTHER): Payer: Commercial Managed Care - HMO | Admitting: Family Medicine

## 2021-10-31 VITALS — BP 132/84 | HR 101 | Temp 98.2°F | Ht 63.0 in | Wt 284.0 lb

## 2021-10-31 DIAGNOSIS — I152 Hypertension secondary to endocrine disorders: Secondary | ICD-10-CM | POA: Diagnosis not present

## 2021-10-31 DIAGNOSIS — E785 Hyperlipidemia, unspecified: Secondary | ICD-10-CM

## 2021-10-31 DIAGNOSIS — E1159 Type 2 diabetes mellitus with other circulatory complications: Secondary | ICD-10-CM

## 2021-10-31 DIAGNOSIS — E1165 Type 2 diabetes mellitus with hyperglycemia: Secondary | ICD-10-CM

## 2021-10-31 DIAGNOSIS — E1169 Type 2 diabetes mellitus with other specified complication: Secondary | ICD-10-CM

## 2021-10-31 DIAGNOSIS — E66813 Obesity, class 3: Secondary | ICD-10-CM

## 2021-10-31 DIAGNOSIS — E669 Obesity, unspecified: Secondary | ICD-10-CM

## 2021-10-31 DIAGNOSIS — Z6841 Body Mass Index (BMI) 40.0 and over, adult: Secondary | ICD-10-CM

## 2021-10-31 DIAGNOSIS — Z7985 Long-term (current) use of injectable non-insulin antidiabetic drugs: Secondary | ICD-10-CM

## 2021-10-31 MED ORDER — TRULICITY 0.75 MG/0.5ML ~~LOC~~ SOAJ: 0.7500 mg | SUBCUTANEOUS | 0 refills | Status: DC

## 2021-10-31 MED ORDER — ATORVASTATIN CALCIUM 10 MG PO TABS: 10.0000 mg | ORAL_TABLET | Freq: Every day | ORAL | 0 refills | Status: DC

## 2021-10-31 MED ORDER — IRBESARTAN 150 MG PO TABS: 150.0000 mg | ORAL_TABLET | Freq: Every evening | ORAL | 0 refills | Status: DC

## 2021-11-01 NOTE — Progress Notes (Signed)
Chief Complaint:   OBESITY Lindsay French is here to discuss her progress with her obesity treatment plan along with follow-up of her obesity related diagnoses. Lindsay French is on the [employer] and states she is following her eating plan approximately 0% of the time. Lindsay French states she is exercising 0 minutes 0 times per week.  Today's visit was #: 5 Starting weight: 268 lbs Starting date: 08/01/2021 Today's weight: 284 lbs Today's date: 10/31/2021 Total lbs lost to date: 0 lbs Total lbs lost since last in-office visit: 0  Interim History: Lindsay French is doing 2-3 protein shakes everyday. She is also doing greek yogurt, cottage cheese and string cheese through out the day. Wondering about data for logging food and protein. She is enjoying journaling and thinks she would like to log food. Going to [place] the 1st week in September.  Subjective:   1. Type 2 diabetes mellitus with hyperglycemia, without long-term current use of insulin (HCC) Lindsay French is doing decently on Trulicity, not noticing any side effects currently.  2. Hypertension associated with type 2 diabetes mellitus (HCC) Lindsay French is currently taking Irbesartan 150 mg. Denies chest pain, chest pressure and headache. Blood pressure labile in past.   3. Hyperlipidemia associated with type 2 diabetes mellitus (HCC) Lindsay French is currently taking Lipitor without appreciable side effects noted. Elevated transaminases on initial blood work.   Assessment/Plan:   1. Type 2 diabetes mellitus with hyperglycemia, without long-term current use of insulin (HCC) We will refill Trulicity 0.75 mg SubQ once weekly for 1 month with 0 refills.  -Refill Dulaglutide (TRULICITY) 0.75 MG/0.5ML SOPN; Inject 0.75 mg into the skin once a week.  Dispense: 2 mL; Refill: 0  2. Hypertension associated with type 2 diabetes mellitus (HCC) We will refill Irbesartan 150 mg by mouth daily for 3 months with 0 refills.  -Refill irbesartan (AVAPRO) 150 MG tablet; Take 1 tablet  (150 mg total) by mouth every evening.  Dispense: 90 tablet; Refill: 0  3. Hyperlipidemia associated with type 2 diabetes mellitus (HCC) We will refill Lipitor 10 mg by mouth daily for 3 months with 0 refills.  -Refill atorvastatin (LIPITOR) 10 MG tablet; Take 1 tablet (10 mg total) by mouth daily.  Dispense: 90 tablet; Refill: 0  4. Obesity with current BMI of 50.3 Lindsay French is currently in the action stage of change. As such, her goal is to continue with weight loss efforts. She has agreed to keeping a food journal and adhering to recommended goals of 2200-2300 calories and 150+ grams of protein daily.  Exercise goals: All adults should avoid inactivity. Some physical activity is better than none, and adults who participate in any amount of physical activity gain some health benefits.  Behavioral modification strategies: increasing lean protein intake, meal planning and cooking strategies, keeping healthy foods in the home, and planning for success.  Lindsay French has agreed to follow-up with our clinic in 3 weeks. She was informed of the importance of frequent follow-up visits to maximize her success with intensive lifestyle modifications for her multiple health conditions.   Objective:   Blood pressure 132/84, pulse (!) 101, temperature 98.2 F (36.8 C), height 5\' 3"  (1.6 m), weight 284 lb (128.8 kg), SpO2 92 %. Body mass index is 50.31 kg/m.  General: Cooperative, alert, well developed, in no acute distress. HEENT: Conjunctivae and lids unremarkable. Cardiovascular: Regular rhythm.  Lungs: Normal work of breathing. Neurologic: No focal deficits.   Lab Results  Component Value Date   CREATININE 0.62 06/26/2021   BUN  4 (L) 06/26/2021   NA 135 06/26/2021   K 4.1 06/26/2021   CL 96 (L) 06/26/2021   CO2 27 06/26/2021   Lab Results  Component Value Date   ALT 47 (H) 06/26/2021   AST 70 (H) 06/26/2021   ALKPHOS 135 (H) 11/09/2020   BILITOT 0.4 06/26/2021   Lab Results  Component Value  Date   HGBA1C 6.7 (H) 08/01/2021   Lab Results  Component Value Date   INSULIN 52.7 (H) 08/01/2021   Lab Results  Component Value Date   TSH 2.550 08/01/2021   Lab Results  Component Value Date   CHOL 256 (H) 06/26/2021   HDL 49 (L) 06/26/2021   LDLCALC 170 (H) 06/26/2021   TRIG 221 (H) 06/26/2021   CHOLHDL 5.2 (H) 06/26/2021   Lab Results  Component Value Date   VD25OH 16.3 (L) 08/01/2021   Lab Results  Component Value Date   WBC 13.4 (H) 06/26/2021   HGB 12.6 06/26/2021   HCT 38.1 06/26/2021   MCV 93.6 06/26/2021   PLT 327 06/26/2021   No results found for: "IRON", "TIBC", "FERRITIN"  Attestation Statements:   Reviewed by clinician on day of visit: allergies, medications, problem list, medical history, surgical history, family history, social history, and previous encounter notes.  I, Fortino Sic, RMA am acting as transcriptionist for Reuben Likes, MD.  I have reviewed the above documentation for accuracy and completeness, and I agree with the above. - Reuben Likes, MD

## 2021-11-06 ENCOUNTER — Other Ambulatory Visit (INDEPENDENT_AMBULATORY_CARE_PROVIDER_SITE_OTHER): Payer: Self-pay | Admitting: Family Medicine

## 2021-11-06 DIAGNOSIS — E1159 Type 2 diabetes mellitus with other circulatory complications: Secondary | ICD-10-CM

## 2021-11-06 DIAGNOSIS — E1165 Type 2 diabetes mellitus with hyperglycemia: Secondary | ICD-10-CM

## 2021-11-21 ENCOUNTER — Ambulatory Visit (INDEPENDENT_AMBULATORY_CARE_PROVIDER_SITE_OTHER): Payer: Commercial Managed Care - HMO | Admitting: Family Medicine

## 2021-11-21 ENCOUNTER — Encounter (INDEPENDENT_AMBULATORY_CARE_PROVIDER_SITE_OTHER): Payer: Self-pay | Admitting: Family Medicine

## 2021-11-21 VITALS — BP 132/77 | HR 102 | Temp 98.8°F | Ht 63.0 in | Wt 288.0 lb

## 2021-11-21 DIAGNOSIS — Z7985 Long-term (current) use of injectable non-insulin antidiabetic drugs: Secondary | ICD-10-CM

## 2021-11-21 DIAGNOSIS — E1169 Type 2 diabetes mellitus with other specified complication: Secondary | ICD-10-CM | POA: Diagnosis not present

## 2021-11-21 DIAGNOSIS — E1165 Type 2 diabetes mellitus with hyperglycemia: Secondary | ICD-10-CM | POA: Diagnosis not present

## 2021-11-21 DIAGNOSIS — E559 Vitamin D deficiency, unspecified: Secondary | ICD-10-CM

## 2021-11-21 DIAGNOSIS — E785 Hyperlipidemia, unspecified: Secondary | ICD-10-CM

## 2021-11-21 DIAGNOSIS — Z6841 Body Mass Index (BMI) 40.0 and over, adult: Secondary | ICD-10-CM

## 2021-11-21 DIAGNOSIS — E669 Obesity, unspecified: Secondary | ICD-10-CM

## 2021-11-21 MED ORDER — TRULICITY 0.75 MG/0.5ML ~~LOC~~ SOAJ: 0.7500 mg | SUBCUTANEOUS | 0 refills | Status: DC

## 2021-11-21 MED ORDER — VITAMIN D (ERGOCALCIFEROL) 1.25 MG (50000 UNIT) PO CAPS: 50000.0000 [IU] | ORAL_CAPSULE | ORAL | 0 refills | Status: DC

## 2021-11-23 NOTE — Progress Notes (Signed)
Chief Complaint:   OBESITY Lindsay French is here to discuss her progress with her obesity treatment plan along with follow-up of her obesity related diagnoses. Lindsay French is on keeping a food journal and adhering to recommended goals of 2200-2300 calories and 150+ grams of protein and states she is following her eating plan approximately 0% of the time. Lindsay French states she is exercising 0 minutes 0 times per week.  Today's visit was #: 6 Starting weight: 268 lbs Starting date: 08/01/2021 Today's weight: 288 lbs Today's date: 11/21/2021 Total lbs lost to date: 0 lbs Total lbs lost since last in-office visit: 0  Interim History: Lindsay French felt frustrated when logging. She got 92 grams of protein and 1100 calories. She did find [employer] chicken enchiladas and this helped with protein intake. She has been eating quite a bit of Ch[employer]ani yogurt and does have quick options for dinner that have a good amount of nutrition. She is managing protein shake for breakfast. She is going to the dentist tomorrow to get teeth looked at, as she needs a significant amount of dental work done.  Subjective:   1. Type 2 diabetes mellitus with hyperglycemia, without long-term current use of insulin (HCC) Lindsay French is doing well on Trulicity with no significant GI side effects but she is limited in food intake due to teeth issues. Her last A1c was 6.7, insulin was 52.7.  2. Hyperlipidemia associated with type 2 diabetes mellitus (HCC) Lindsay French is currently taking Lipitor with transaminitis (on pREP). Her LDL at 170, Trigly at 221, and HDL at 49.  3. Vitamin D deficiency Lindsay French is not taking Vit D. She notes fatigue. Her Vit D level of 16.3.  Assessment/Plan:   1. Type 2 diabetes mellitus with hyperglycemia, without long-term current use of insulin (HCC) We will refill Trulicity 0.75 mg SubQ once weekly for 1 month with 0 refills.  -Refill Dulaglutide (TRULICITY) 0.75 MG/0.5ML SOPN; Inject 0.75 mg into the skin once a week.   Dispense: 2 mL; Refill: 0  2. Hyperlipidemia associated with type 2 diabetes mellitus (HCC) We will obtain labs at next appointment.  3. Vitamin D deficiency We will refill Vit D 50,000 IU once a week for 1 month with 0 refills.  -Refill Vitamin D, Ergocalciferol, (DRISDOL) 1.25 MG (50000 UNIT) CAPS capsule; Take 1 capsule (50,000 Units total) by mouth every 7 (seven) days.  Dispense: 4 capsule; Refill: 0  4. Obesity with current BMI of 51.1 Lindsay French is currently in the action stage of change. As such, her goal is to continue with weight loss efforts. She has agreed to keeping a food journal and adhering to recommended goals of 2200-2300 calories and 150+ grams of protein daily.  Exercise goals: All adults should avoid inactivity. Some physical activity is better than none, and adults who participate in any amount of physical activity gain some health benefits.  Behavioral modification strategies: increasing lean protein intake, no skipping meals, meal planning and cooking strategies, and keeping a strict food journal.  Lindsay French has agreed to follow-up with our clinic in 5 weeks. She was informed of the importance of frequent follow-up visits to maximize her success with intensive lifestyle modifications for her multiple health conditions.   Objective:   Blood pressure 132/77, pulse (!) 102, temperature 98.8 F (37.1 C), height 5\' 3"  (1.6 m), weight 288 lb (130.6 kg), SpO2 96 %. Body mass index is 51.02 kg/m.  General: Cooperative, alert, well developed, in no acute distress. HEENT: Conjunctivae and lids unremarkable. Cardiovascular:  Regular rhythm.  Lungs: Normal work of breathing. Neurologic: No focal deficits.   Lab Results  Component Value Date   CREATININE 0.62 06/26/2021   BUN 4 (L) 06/26/2021   NA 135 06/26/2021   K 4.1 06/26/2021   CL 96 (L) 06/26/2021   CO2 27 06/26/2021   Lab Results  Component Value Date   ALT 47 (H) 06/26/2021   AST 70 (H) 06/26/2021   ALKPHOS 135  (H) 11/09/2020   BILITOT 0.4 06/26/2021   Lab Results  Component Value Date   HGBA1C 6.7 (H) 08/01/2021   Lab Results  Component Value Date   INSULIN 52.7 (H) 08/01/2021   Lab Results  Component Value Date   TSH 2.550 08/01/2021   Lab Results  Component Value Date   CHOL 256 (H) 06/26/2021   HDL 49 (L) 06/26/2021   LDLCALC 170 (H) 06/26/2021   TRIG 221 (H) 06/26/2021   CHOLHDL 5.2 (H) 06/26/2021   Lab Results  Component Value Date   VD25OH 16.3 (L) 08/01/2021   Lab Results  Component Value Date   WBC 13.4 (H) 06/26/2021   HGB 12.6 06/26/2021   HCT 38.1 06/26/2021   MCV 93.6 06/26/2021   PLT 327 06/26/2021   No results found for: "IRON", "TIBC", "FERRITIN"  Attestation Statements:   Reviewed by clinician on day of visit: allergies, medications, problem list, medical history, surgical history, family history, social history, and previous encounter notes.  I, Fortino Sic, RMA am acting as transcriptionist for Reuben Likes, MD.  I have reviewed the above documentation for accuracy and completeness, and I agree with the above. - Reuben Likes, MD

## 2021-11-29 ENCOUNTER — Encounter (INDEPENDENT_AMBULATORY_CARE_PROVIDER_SITE_OTHER): Payer: Self-pay

## 2022-01-01 ENCOUNTER — Encounter (INDEPENDENT_AMBULATORY_CARE_PROVIDER_SITE_OTHER): Payer: Self-pay | Admitting: Family Medicine

## 2022-01-01 ENCOUNTER — Ambulatory Visit (INDEPENDENT_AMBULATORY_CARE_PROVIDER_SITE_OTHER): Payer: Commercial Managed Care - HMO | Admitting: Family Medicine

## 2022-01-01 VITALS — BP 129/78 | HR 86 | Temp 98.0°F | Ht 63.0 in | Wt 303.0 lb

## 2022-01-01 DIAGNOSIS — E559 Vitamin D deficiency, unspecified: Secondary | ICD-10-CM | POA: Diagnosis not present

## 2022-01-01 DIAGNOSIS — E1165 Type 2 diabetes mellitus with hyperglycemia: Secondary | ICD-10-CM

## 2022-01-01 DIAGNOSIS — K760 Fatty (change of) liver, not elsewhere classified: Secondary | ICD-10-CM

## 2022-01-01 DIAGNOSIS — Z6841 Body Mass Index (BMI) 40.0 and over, adult: Secondary | ICD-10-CM

## 2022-01-01 DIAGNOSIS — E785 Hyperlipidemia, unspecified: Secondary | ICD-10-CM

## 2022-01-01 DIAGNOSIS — Z7985 Long-term (current) use of injectable non-insulin antidiabetic drugs: Secondary | ICD-10-CM

## 2022-01-01 DIAGNOSIS — E1169 Type 2 diabetes mellitus with other specified complication: Secondary | ICD-10-CM

## 2022-01-01 DIAGNOSIS — E538 Deficiency of other specified B group vitamins: Secondary | ICD-10-CM

## 2022-01-01 DIAGNOSIS — E669 Obesity, unspecified: Secondary | ICD-10-CM

## 2022-01-01 MED ORDER — TRULICITY 0.75 MG/0.5ML ~~LOC~~ SOAJ: 0.7500 mg | SUBCUTANEOUS | 0 refills | Status: DC

## 2022-01-02 LAB — COMPREHENSIVE METABOLIC PANEL
ALT: 53 IU/L — ABNORMAL HIGH (ref 0–32)
AST: 106 IU/L — ABNORMAL HIGH (ref 0–40)
Albumin/Globulin Ratio: 1.5 (ref 1.2–2.2)
Albumin: 4.1 g/dL (ref 3.9–4.9)
Alkaline Phosphatase: 114 IU/L (ref 44–121)
BUN/Creatinine Ratio: 6 — ABNORMAL LOW (ref 9–23)
BUN: 3 mg/dL — ABNORMAL LOW (ref 6–24)
Bilirubin Total: <0.2 mg/dL (ref 0.0–1.2)
CO2: 23 mmol/L (ref 20–29)
Calcium: 9 mg/dL (ref 8.7–10.2)
Chloride: 100 mmol/L (ref 96–106)
Creatinine, Ser: 0.53 mg/dL — ABNORMAL LOW (ref 0.57–1.00)
Globulin, Total: 2.7 g/dL (ref 1.5–4.5)
Glucose: 146 mg/dL — ABNORMAL HIGH (ref 70–99)
Potassium: 4.6 mmol/L (ref 3.5–5.2)
Sodium: 140 mmol/L (ref 134–144)
Total Protein: 6.8 g/dL (ref 6.0–8.5)
eGFR: 120 mL/min/{1.73_m2} (ref >59)

## 2022-01-02 LAB — FOLATE: Folate: 6.6 ng/mL (ref >3.0)

## 2022-01-02 LAB — LIPID PANEL WITH LDL/HDL RATIO
Cholesterol, Total: 156 mg/dL (ref 100–199)
HDL: 40 mg/dL (ref >39)
LDL Chol Calc (NIH): 84 mg/dL (ref 0–99)
LDL/HDL Ratio: 2.1 ratio (ref 0.0–3.2)
Triglycerides: 188 mg/dL — ABNORMAL HIGH (ref 0–149)
VLDL Cholesterol Cal: 32 mg/dL (ref 5–40)

## 2022-01-02 LAB — HEMOGLOBIN A1C
Est. average glucose Bld gHb Est-mCnc: 157 mg/dL
Hgb A1c MFr Bld: 7.1 % — ABNORMAL HIGH (ref 4.8–5.6)

## 2022-01-02 LAB — VITAMIN D 25 HYDROXY (VIT D DEFICIENCY, FRACTURES): Vit D, 25-Hydroxy: 23.4 ng/mL — ABNORMAL LOW (ref 30.0–100.0)

## 2022-01-02 LAB — INSULIN, RANDOM: INSULIN: 99.2 u[IU]/mL — ABNORMAL HIGH (ref 2.6–24.9)

## 2022-01-03 NOTE — Progress Notes (Signed)
Chief Complaint:   OBESITY Lindsay French is here to discuss her progress with her obesity treatment plan along with follow-up of her obesity related diagnoses. Lindsay French is on keeping a food journal and adhering to recommended goals of 2200-2300 calories and 150+ grams protein and states she is following her eating plan approximately ?% of the time. Lindsay French states she is exercising 0 minutes 0 times per week.  Today's visit was #: 7 Starting weight: 268 lbs Starting date: 08/01/2021 Today's weight: 303 lbs Today's date: 01/01/2022 Total lbs lost to date: 0 lbs Total lbs lost since last in-office visit: 0  Interim History: Lindsay French just returned from 1 week trip to [place] to visit family. She did do a bit of wine tasting. Flying was difficult and uncomfortable. Nice to take a week off work. Felt she did well while away in terms of food choices. Aiming for increase protein snacks.  Subjective:   1. Type 2 diabetes mellitus with hyperglycemia, without long-term current use of insulin (HCC) Lindsay French's A1c 6.7, insulin 52.7. She is on Trulicity.  2. Vitamin D deficiency Lindsay French is currently taking prescription Vit D 50,000 IU once a week. Vit D level of 16.3 in April 2023.  3. Hyperlipidemia associated with type 2 diabetes mellitus (HCC) Lindsay French's LDL 170, HDL 49, Trigly 221. She is on Lipitor without significant side effects.  4. NAFLD (nonalcoholic fatty liver disease) Lindsay French's transaminitis elevated. She is on HAART and statin.  5. Folate deficiency Lindsay French's last Folate level low. She has started multivitamin.  Assessment/Plan:   1. Type 2 diabetes mellitus with hyperglycemia, without long-term current use of insulin (HCC) We will obtain labs today. We will refill Trulicity 0.75 mg SubQ once weekly for 1 month with 0 refills.  -Refill Dulaglutide (TRULICITY) 0.75 MG/0.5ML SOPN; Inject 0.75 mg into the skin once a week.  Dispense: 2 mL; Refill: 0  - Hemoglobin A1c - Insulin, random  2. Vitamin  D deficiency We will obtain labs today.  - VITAMIN D 25 Hydroxy (Vit-D Deficiency, Fractures)  3. Hyperlipidemia associated with type 2 diabetes mellitus (HCC) We will obtain labs today.  - Lipid Panel With LDL/HDL Ratio  4. NAFLD (nonalcoholic fatty liver disease) We will obtain labs today.  - Comprehensive metabolic panel  5. Folate deficiency We will obtain labs today.  - Folate  6. Obesity with current BMI of 53.7 [name] is currently in the action stage of change. As such, her goal is to continue with weight loss efforts. She has agreed to keeping a food journal and adhering to recommended goals of 2200-2300 calories and 150+ grams of protein daily.   Exercise goals: No exercise has been prescribed at this time.  Behavioral modification strategies: increasing lean protein intake, meal planning and cooking strategies, keeping healthy foods in the home, and planning for success.  Lindsay French has agreed to follow-up with our clinic in 3 weeks. She was informed of the importance of frequent follow-up visits to maximize her success with intensive lifestyle modifications for her multiple health conditions.   Lindsay French was informed we would discuss her lab results at her next visit unless there is a critical issue that needs to be addressed sooner. Lindsay French agreed to keep her next visit at the agreed upon time to discuss these results.  Objective:   Blood pressure 129/78, pulse 86, temperature 98 F (36.7 C), height 5\' 3"  (1.6 m), weight (!) 303 lb (137.4 kg), SpO2 93 %. Body mass index is 53.67 kg/m.  General:  Cooperative, alert, well developed, in no acute distress. HEENT: Conjunctivae and lids unremarkable. Cardiovascular: Regular rhythm.  Lungs: Normal work of breathing. Neurologic: No focal deficits.   Lab Results  Component Value Date   CREATININE 0.53 (L) 01/01/2022   BUN 3 (L) 01/01/2022   NA 140 01/01/2022   K 4.6 01/01/2022   CL 100 01/01/2022   CO2 23 01/01/2022   Lab  Results  Component Value Date   ALT 53 (H) 01/01/2022   AST 106 (H) 01/01/2022   ALKPHOS 114 01/01/2022   BILITOT <0.2 01/01/2022   Lab Results  Component Value Date   HGBA1C 7.1 (H) 01/01/2022   HGBA1C 6.7 (H) 08/01/2021   Lab Results  Component Value Date   INSULIN 99.2 (H) 01/01/2022   INSULIN 52.7 (H) 08/01/2021   Lab Results  Component Value Date   TSH 2.550 08/01/2021   Lab Results  Component Value Date   CHOL 156 01/01/2022   HDL 40 01/01/2022   LDLCALC 84 01/01/2022   TRIG 188 (H) 01/01/2022   CHOLHDL 5.2 (H) 06/26/2021   Lab Results  Component Value Date   VD25OH 23.4 (L) 01/01/2022   VD25OH 16.3 (L) 08/01/2021   Lab Results  Component Value Date   WBC 13.4 (H) 06/26/2021   HGB 12.6 06/26/2021   HCT 38.1 06/26/2021   MCV 93.6 06/26/2021   PLT 327 06/26/2021   No results found for: "IRON", "TIBC", "FERRITIN"  Attestation Statements:   Reviewed by clinician on day of visit: allergies, medications, problem list, medical history, surgical history, family history, social history, and previous encounter notes.  I, Fortino Sic, RMA am acting as transcriptionist for Reuben Likes, MD.  I have reviewed the above documentation for accuracy and completeness, and I agree with the above. - Reuben Likes, MD

## 2022-01-04 ENCOUNTER — Encounter (INDEPENDENT_AMBULATORY_CARE_PROVIDER_SITE_OTHER): Payer: Self-pay | Admitting: Family Medicine

## 2022-01-04 NOTE — Telephone Encounter (Signed)
Please advise 

## 2022-01-08 ENCOUNTER — Other Ambulatory Visit (INDEPENDENT_AMBULATORY_CARE_PROVIDER_SITE_OTHER): Payer: Self-pay | Admitting: Family Medicine

## 2022-01-08 DIAGNOSIS — E1165 Type 2 diabetes mellitus with hyperglycemia: Secondary | ICD-10-CM

## 2022-01-08 MED ORDER — TIRZEPATIDE 2.5 MG/0.5ML ~~LOC~~ SOAJ: 2.5000 mg | SUBCUTANEOUS | 0 refills | Status: DC

## 2022-01-08 NOTE — Telephone Encounter (Signed)
Denial letter on desk

## 2022-01-09 ENCOUNTER — Encounter (INDEPENDENT_AMBULATORY_CARE_PROVIDER_SITE_OTHER): Payer: Self-pay | Admitting: Family Medicine

## 2022-01-09 ENCOUNTER — Telehealth (INDEPENDENT_AMBULATORY_CARE_PROVIDER_SITE_OTHER): Payer: Self-pay | Admitting: Family Medicine

## 2022-01-09 ENCOUNTER — Other Ambulatory Visit (INDEPENDENT_AMBULATORY_CARE_PROVIDER_SITE_OTHER): Payer: Self-pay | Admitting: Family Medicine

## 2022-01-09 ENCOUNTER — Encounter (INDEPENDENT_AMBULATORY_CARE_PROVIDER_SITE_OTHER): Payer: Self-pay

## 2022-01-09 DIAGNOSIS — E1165 Type 2 diabetes mellitus with hyperglycemia: Secondary | ICD-10-CM

## 2022-01-09 MED ORDER — OZEMPIC (0.25 OR 0.5 MG/DOSE) 2 MG/3ML ~~LOC~~ SOPN: 0.2500 mg | PEN_INJECTOR | SUBCUTANEOUS | 0 refills | Status: DC

## 2022-01-09 NOTE — Telephone Encounter (Signed)
Dr. Jearld Shines - Prior authorization denied for [facility]. See Media tab for denial letter. Last office note was sent with prior authorization. along with tried and failed Trulicity documentation. Patient sent denial message via mychart.

## 2022-01-09 NOTE — Telephone Encounter (Signed)
Please advise 

## 2022-01-10 NOTE — Telephone Encounter (Signed)
Please check PA, Thanks

## 2022-01-12 ENCOUNTER — Encounter (INDEPENDENT_AMBULATORY_CARE_PROVIDER_SITE_OTHER): Payer: Self-pay | Admitting: Family Medicine

## 2022-01-15 ENCOUNTER — Encounter (INDEPENDENT_AMBULATORY_CARE_PROVIDER_SITE_OTHER): Payer: Self-pay

## 2022-01-15 ENCOUNTER — Telehealth (INDEPENDENT_AMBULATORY_CARE_PROVIDER_SITE_OTHER): Payer: Self-pay | Admitting: Family Medicine

## 2022-01-15 NOTE — Telephone Encounter (Signed)
Dr. Jearld Shines - Prior authorization denied for Ozempic. Copy of denial letter in media in chart. Patient sent denial message via mychart. Last office note with labs attached to prior authorization. Included in prior authorization, informed insurance patient has tried and failed metformin. Also informed insurance that patient has tried Trulicity as well and it was ineffective.

## 2022-01-15 NOTE — Telephone Encounter (Signed)
Please advise pt and send appeal info if needed for pt to start appeal

## 2022-01-16 ENCOUNTER — Other Ambulatory Visit: Payer: Self-pay | Admitting: Infectious Diseases

## 2022-01-16 DIAGNOSIS — B20 Human immunodeficiency virus [HIV] disease: Secondary | ICD-10-CM

## 2022-01-16 NOTE — Telephone Encounter (Signed)
My chart message sent to patient to see if she is following up at [facility] or with Dr.Hatcher.

## 2022-01-29 ENCOUNTER — Telehealth (INDEPENDENT_AMBULATORY_CARE_PROVIDER_SITE_OTHER): Payer: Commercial Managed Care - HMO | Admitting: Family Medicine

## 2022-01-29 ENCOUNTER — Ambulatory Visit (INDEPENDENT_AMBULATORY_CARE_PROVIDER_SITE_OTHER): Payer: Commercial Managed Care - HMO | Admitting: Family Medicine

## 2022-01-29 ENCOUNTER — Encounter (INDEPENDENT_AMBULATORY_CARE_PROVIDER_SITE_OTHER): Payer: Self-pay | Admitting: Family Medicine

## 2022-01-29 DIAGNOSIS — E559 Vitamin D deficiency, unspecified: Secondary | ICD-10-CM

## 2022-01-29 DIAGNOSIS — F32A Depression, unspecified: Secondary | ICD-10-CM | POA: Insufficient documentation

## 2022-01-29 DIAGNOSIS — E785 Hyperlipidemia, unspecified: Secondary | ICD-10-CM | POA: Diagnosis not present

## 2022-01-29 DIAGNOSIS — E1169 Type 2 diabetes mellitus with other specified complication: Secondary | ICD-10-CM

## 2022-01-29 DIAGNOSIS — E1159 Type 2 diabetes mellitus with other circulatory complications: Secondary | ICD-10-CM

## 2022-01-29 DIAGNOSIS — Z7985 Long-term (current) use of injectable non-insulin antidiabetic drugs: Secondary | ICD-10-CM

## 2022-01-29 DIAGNOSIS — K760 Fatty (change of) liver, not elsewhere classified: Secondary | ICD-10-CM

## 2022-01-29 DIAGNOSIS — E1165 Type 2 diabetes mellitus with hyperglycemia: Secondary | ICD-10-CM | POA: Diagnosis not present

## 2022-01-29 DIAGNOSIS — E669 Obesity, unspecified: Secondary | ICD-10-CM

## 2022-01-29 DIAGNOSIS — F418 Other specified anxiety disorders: Secondary | ICD-10-CM

## 2022-01-29 DIAGNOSIS — I152 Hypertension secondary to endocrine disorders: Secondary | ICD-10-CM

## 2022-01-29 DIAGNOSIS — Z6841 Body Mass Index (BMI) 40.0 and over, adult: Secondary | ICD-10-CM

## 2022-01-29 MED ORDER — VITAMIN D (ERGOCALCIFEROL) 1.25 MG (50000 UNIT) PO CAPS: 50000.0000 [IU] | ORAL_CAPSULE | ORAL | 0 refills | Status: DC

## 2022-01-29 MED ORDER — IRBESARTAN 150 MG PO TABS: 150.0000 mg | ORAL_TABLET | Freq: Every evening | ORAL | 0 refills | Status: DC

## 2022-01-29 MED ORDER — METFORMIN HCL 500 MG PO TABS: 500.0000 mg | ORAL_TABLET | Freq: Every day | ORAL | 0 refills | Status: DC

## 2022-01-29 MED ORDER — ATORVASTATIN CALCIUM 10 MG PO TABS: 10.0000 mg | ORAL_TABLET | Freq: Every day | ORAL | 0 refills | Status: DC

## 2022-01-29 NOTE — Progress Notes (Signed)
TeleHealth Visit:  This visit was completed with telemedicine (audio/video) technology. Lindsay French has verbally consented to this TeleHealth visit. The patient is located at home, the provider is located at home. The participants in this visit include the listed provider and patient. The visit was conducted today via MyChart video.  OBESITY Lindsay French is here to discuss her progress with her obesity treatment plan along with follow-up of her obesity related diagnoses.   Today's visit was # 8 Starting weight: 268 lbs Starting date: 08/01/2021 Weight at last in office visit: 303 lbs on 01/01/22 Total weight loss: 0 lbs at last in office visit on 01/01/22. Today's reported weight: No weight reported.  Nutrition Plan: keeping a food journal and adhering to recommended goals of 2200-2300 calories and 150+ grams of protein daily.   Current exercise: no regular exercise  Interim History: She has not been journaling.  She doubts that she eats 1000 cal/day.  She skips breakfast and lunch and then she and her husband order takeout for dinner.  She wants to cook meals but he never wants to eat what she suggests. She says she feels like giving up.   Her health insurance has denied coverage for both Ozempic and Mounjaro.  She would like to do water walking at the [facility] but is afraid that she cannot walk from the parking lot to the pool.  Assessment/Plan:  We went over her recent lab results in depth today.  1. Type II Diabetes with hyperglycemia without long-term use of insulin HgbA1c is not at goal. Last A1c was above goal at 7.1 on 01/01/2022, up from 6.7 on 08/01/2021.  She was on Trulicity between 7/61/6073 and 01/01/2022 and her A1c increased rather than decreased. Insurance has denied coverage for both Mounjaro and Ozempic. Ozempic or Mounjaro are both much better choices for management of her type 2 diabetes.  Medication(s): None  Lab Results  Component Value Date    HGBA1C 7.1 (H) 01/01/2022   HGBA1C 6.7 (H) 08/01/2021   Lab Results  Component Value Date   LDLCALC 84 01/01/2022   CREATININE 0.53 (L) 01/01/2022    Plan: Will start metformin.  She has tolerated this well in the past.   New prescription-metformin 500 mg daily with breakfast. Patient will file and appeal for coverage for Ozempic.  I told her we will help by providing whatever she needs to file this appeal.  2. NAFLD On highly active antiretroviral therapy (HAART) and statin.  Dr. Bobby Rumpf manages HIV.  July 2022 CT angio chest documents hepatic steatosis. Most recent liver enzymes have increased. Lab Results  Component Value Date   ALT 53 (H) 01/01/2022   AST 106 (H) 01/01/2022   ALKPHOS 114 01/01/2022   BILITOT <0.2 01/01/2022   Plan: Copy of most recent liver enzymes sent to Dr. Bobby Rumpf. Continue to work on weight loss and exercise as tolerated.  3. Hyperlipidemia associated with type 2 diabetes LDL is at goal.  Labs from 01/01/2022-LDL at goal (84), triglycerides elevated at 188 but improved, HDL low at 40. Medication(s): Lipitor 10 mg daily. Cardiovascular risk factors: diabetes mellitus, dyslipidemia, hypertension, obesity (BMI >= 30 kg/m2), and sedentary lifestyle  Lab Results  Component Value Date   CHOL 156 01/01/2022   HDL 40 01/01/2022   LDLCALC 84 01/01/2022   TRIG 188 (H) 01/01/2022   CHOLHDL 5.2 (H) 06/26/2021   Lab Results  Component Value Date   ALT 53 (H) 01/01/2022   AST 106 (H) 01/01/2022  ALKPHOS 114 01/01/2022   BILITOT <0.2 01/01/2022   The 10-year ASCVD risk score (Arnett DK, et al., 2019) is: 1.7%   Values used to calculate the score:     Age: 41 years     Sex: Female     Is Non-Hispanic African American: No     Diabetic: Yes     Tobacco smoker: No     Systolic Blood Pressure: [id number] mmHg     Is BP treated: Yes     HDL Cholesterol: 40 mg/dL     Total Cholesterol: 156 mg/dL  Plan: Continue and refill Lipitor 10 mg  daily.  4. Hypertension associated with type 2 diabetes Hypertension well controlled.  Medication(s): Irbesartan 150 mg daily  BP Readings from Last 3 Encounters:  01/01/22 129/78  11/21/21 132/77  10/31/21 132/84   Lab Results  Component Value Date   CREATININE 0.53 (L) 01/01/2022   CREATININE 0.62 06/26/2021   CREATININE 0.59 11/09/2020    Plan: Continue and refill irbesartan 150 mg daily.   5.  Depression with anxiety Lindsay French was tearful during our visit today.  She says sometimes she feels like she wants to give up and wonders "what's the point?"? Has had quite a few stressors recently-fridge is broken, broke her tooth, request for coverage for Ozempic and Mounjaro have been denied. She denies suicidal or homicidal ideation. She sees a psychiatrist every 3 months.  Not currently seeing a counselor.  Has seen numerous counselors throughout her life but has not able to find the" right" one.  Meds: Adderall 20 mg daily, alprazolam 0.5 mg twice daily as needed for anxiety, Rexulti 2 mg daily, Viibryd 20 mg daily.  Plan: Follow-up with psychiatry as directed. Referral to psychology. Continue all medications as prescribed.   6.  Vitamin D Deficiency Vitamin D is at goal of 50.  She is on weekly prescription Vitamin D 50,000 IU.  Level has increased from 16.3 on 411 23-23.4 on 01/01/2022. Lab Results  Component Value Date   VD25OH 23.4 (L) 01/01/2022   VD25OH 16.3 (L) 08/01/2021    Plan: Continue and refill prescription vitamin D 50,000 IU weekly.   7. Obesity: Current BMI 53.7 Lindsay French is currently in the action stage of change. As such, her goal is to continue with weight loss efforts.  She has agreed to practicing portion control and making smarter food choices, such as increasing vegetables and decreasing simple carbohydrates.   1.  Plan and cook 2 meals per week with enough for leftovers.  Allow her husband to fend for himself. 2.  Will have protein shake for  breakfast, sandwich for lunch, and cooked  meal with protein and vegetables for dinner.  Exercise goals: I will complete a request for handicap placard so she can begin using the pool at [facility]. Form completed and mailed to her.  Behavioral modification strategies: increasing lean protein intake, no skipping meals, meal planning and cooking strategies, dealing with family or coworker sabotage, and planning for success.  Iiesha has agreed to follow-up with our clinic in 4 weeks.   Orders Placed This Encounter  Procedures   Ambulatory referral to Psychology    Medications Discontinued During This Encounter  Medication Reason   irbesartan (AVAPRO) 150 MG tablet Reorder   atorvastatin (LIPITOR) 10 MG tablet Reorder   Vitamin D, Ergocalciferol, (DRISDOL) 1.25 MG (50000 UNIT) CAPS capsule Reorder     Meds ordered this encounter  Medications   Vitamin D, Ergocalciferol, (DRISDOL) 1.25 MG (  50000 UNIT) CAPS capsule    Sig: Take 1 capsule (50,000 Units total) by mouth every 7 (seven) days.    Dispense:  12 capsule    Refill:  0    Order Specific Question:   Supervising Provider    Answer:   Dell Ponto [[id number]]   metFORMIN (GLUCOPHAGE) 500 MG tablet    Sig: Take 1 tablet (500 mg total) by mouth daily with breakfast.    Dispense:  30 tablet    Refill:  0    Order Specific Question:   Supervising Provider    Answer:   Dell Ponto [[id number]]   atorvastatin (LIPITOR) 10 MG tablet    Sig: Take 1 tablet (10 mg total) by mouth daily.    Dispense:  90 tablet    Refill:  0    Order Specific Question:   Supervising Provider    Answer:   Dell Ponto [[id number]]   irbesartan (AVAPRO) 150 MG tablet    Sig: Take 1 tablet (150 mg total) by mouth every evening.    Dispense:  90 tablet    Refill:  0    Order Specific Question:   Supervising Provider    Answer:   Dell Ponto [[id number]]      Objective:   VITALS: Per patient if applicable, see vitals. GENERAL: Alert and in no  acute distress. CARDIOPULMONARY: No increased WOB. Speaking in clear sentences.  PSYCH: Pleasant and cooperative. Speech normal rate and rhythm. Affect is appropriate. Insight and judgement are appropriate. Attention is focused, linear, and appropriate.  NEURO: Oriented as arrived to appointment on time with no prompting.   Lab Results  Component Value Date   CREATININE 0.53 (L) 01/01/2022   BUN 3 (L) 01/01/2022   NA 140 01/01/2022   K 4.6 01/01/2022   CL 100 01/01/2022   CO2 23 01/01/2022   Lab Results  Component Value Date   ALT 53 (H) 01/01/2022   AST 106 (H) 01/01/2022   ALKPHOS 114 01/01/2022   BILITOT <0.2 01/01/2022   Lab Results  Component Value Date   HGBA1C 7.1 (H) 01/01/2022   HGBA1C 6.7 (H) 08/01/2021   Lab Results  Component Value Date   INSULIN 99.2 (H) 01/01/2022   INSULIN 52.7 (H) 08/01/2021   Lab Results  Component Value Date   TSH 2.550 08/01/2021   Lab Results  Component Value Date   CHOL 156 01/01/2022   HDL 40 01/01/2022   LDLCALC 84 01/01/2022   TRIG 188 (H) 01/01/2022   CHOLHDL 5.2 (H) 06/26/2021   Lab Results  Component Value Date   WBC 13.4 (H) 06/26/2021   HGB 12.6 06/26/2021   HCT 38.1 06/26/2021   MCV 93.6 06/26/2021   PLT 327 06/26/2021   No results found for: "IRON", "TIBC", "FERRITIN" Lab Results  Component Value Date   VD25OH 23.4 (L) 01/01/2022   VD25OH 16.3 (L) 08/01/2021    Attestation Statements:   Reviewed by clinician on day of visit: allergies, medications, problem list, medical history, surgical history, family history, social history, and previous encounter notes.

## 2022-01-30 ENCOUNTER — Encounter (INDEPENDENT_AMBULATORY_CARE_PROVIDER_SITE_OTHER): Payer: Self-pay | Admitting: Family Medicine

## 2022-02-08 ENCOUNTER — Telehealth (INDEPENDENT_AMBULATORY_CARE_PROVIDER_SITE_OTHER): Payer: Self-pay | Admitting: Family Medicine

## 2022-02-08 DIAGNOSIS — E1165 Type 2 diabetes mellitus with hyperglycemia: Secondary | ICD-10-CM

## 2022-02-08 MED ORDER — METFORMIN HCL 500 MG PO TABS: 500.0000 mg | ORAL_TABLET | Freq: Three times a day (TID) | ORAL | 0 refills | Status: DC

## 2022-02-08 NOTE — Telephone Encounter (Signed)
Last OV with Dawn. Would you be able to handle this message? Thanks

## 2022-02-08 NOTE — Telephone Encounter (Signed)
Pt called wanting to speak with Dr. Jeani Sow or her assistant regarding two medications that have been denied by her insurance. Patient was prescribed Ozempic which was denied. Insurance required pt to try Trulicity which did not help. Pt now taking Metformin. Pt's insurance also denied Mounjaro. Pt wants to speak with someone today.

## 2022-02-08 NOTE — Telephone Encounter (Signed)
Called patient in response to most recent MyChart message.  Left voicemail.

## 2022-02-15 ENCOUNTER — Ambulatory Visit
Admission: RE | Admit: 2022-02-15 | Discharge: 2022-02-15 | Disposition: A | Discharge status: Home / Self Care | Payer: Commercial Managed Care - HMO | Source: Ambulatory Visit | Attending: Physician Assistant | Admitting: Physician Assistant

## 2022-02-15 ENCOUNTER — Other Ambulatory Visit: Payer: Self-pay | Admitting: Physician Assistant

## 2022-02-15 DIAGNOSIS — R7989 Other specified abnormal findings of blood chemistry: Secondary | ICD-10-CM

## 2022-02-15 DIAGNOSIS — Z789 Other specified health status: Secondary | ICD-10-CM

## 2022-02-15 MED ORDER — IOPAMIDOL (ISOVUE-370) INJECTION 76%
75.0000 mL | Freq: Once | INTRAVENOUS | Status: AC | PRN
  Administered 2022-02-15: 75 mL via INTRAVENOUS

## 2022-02-21 ENCOUNTER — Encounter: Payer: Commercial Managed Care - HMO | Admitting: Infectious Diseases

## 2022-02-27 ENCOUNTER — Encounter: Payer: Self-pay | Admitting: Infectious Diseases

## 2022-02-27 ENCOUNTER — Other Ambulatory Visit: Payer: Self-pay

## 2022-02-27 ENCOUNTER — Ambulatory Visit (INDEPENDENT_AMBULATORY_CARE_PROVIDER_SITE_OTHER): Payer: Commercial Managed Care - HMO | Admitting: Infectious Diseases

## 2022-02-27 VITALS — BP 106/56 | HR 100 | Temp 98.6°F | Ht 62.0 in | Wt 308.9 lb

## 2022-02-27 DIAGNOSIS — E1165 Type 2 diabetes mellitus with hyperglycemia: Secondary | ICD-10-CM

## 2022-02-27 DIAGNOSIS — Z7984 Long term (current) use of oral hypoglycemic drugs: Secondary | ICD-10-CM

## 2022-02-27 DIAGNOSIS — E66813 Obesity, class 3: Secondary | ICD-10-CM

## 2022-02-27 DIAGNOSIS — Z6841 Body Mass Index (BMI) 40.0 and over, adult: Secondary | ICD-10-CM

## 2022-02-27 DIAGNOSIS — B20 Human immunodeficiency virus [HIV] disease: Secondary | ICD-10-CM

## 2022-02-27 DIAGNOSIS — F418 Other specified anxiety disorders: Secondary | ICD-10-CM | POA: Diagnosis not present

## 2022-02-27 NOTE — Addendum Note (Signed)
Addended by: Truddie Crumble on: 02/27/2022 03:24 PM   Modules accepted: Orders

## 2022-02-27 NOTE — Progress Notes (Signed)
Subjective:    Patient ID: Lindsay French, female  DOB: 01/03/1981, 41 y.o.        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  With hx of LLE pain beginning 02-2021 AVN (MRI [place]) but also had erythema over her L shin. She was seen by [facility] and [facility]. I started her on keflex. 07-05-20 MRI at OSH: Geographic signal abnormality in the proximal tibia and distal fibular (partially imaged). Findings are similar nonspecific but most consistent with severe avascular necrosis. However, given degree of surrounding edema and periosteal reaction, unable to exclude infection or marrow infiltrative process (such as leukemia/lymphoma). Recommend correlation with laboratory data and any systemic sources predisposing patient to AVN (such as sickle cell disease). Depending on these findings, recommend short interval follow-up MRI which also includes the femur.    I saw her in ID f/u on 3-17- I sent her for urgent MRI (see results) due to worsening pain and swelling.  She continued on keflex with no improvement.  I reviewed her MRI on 3-18 and called her to come to hospital on 3-19 twice. She called me back on 3-21 and I encouraged her to come to the hospital due to the fluid collection on her MRI.  She was adm to hospital 3-21, her anbx were stopped, and on 07-15-20 underwent  I & D, partial excision L tibia and abscess. Her Cx grew MSSA She was d/c home on 3-28 with plan for IV ancef via PIC until 08-27-20. 5-10  She was transitioned to keflex 500mg  qid.  Returned (5-19) with worsening wound d/c. Worsened pain, into her arch and her medial knee. In addition to her keflex she had mupirocin oint added.   She was seen 09-15-20 with continued drainage,  medial knee is tender, stiff, limping. She cannot bear full wt.   She was sched for MRI which she had on 09-18-20:  1. Severe bone marrow edema and  intramedullary enhancement involving the proximal left tibial metaphysis and diaphysis with interval removal of the methylmethacrylate antibiotic beads. Central area of lack of enhancement likely reflecting necrosis versus surgical debris. Overall appearance is most consistent with osteomyelitis and postsurgical changes with a sinus track extending from the medullary cavity to the skin surface through a anterior cortical surgical defect. 2. Serpiginous signal abnormality in the distal left femoral diametaphysis consistent with a bone infarct with surrounding bone marrow edema and enhancement which may reflect osteomyelitis versus reactive edema.   She was seen in hospital 6-22 to 6-23 after having PIC malfunction, diarrhea. She had burning with having her K+ repleted and left before being seen by ID.  Dapto was not able to be covered by her insurance and she was changed to weekly dalbavancin infusions.    Aug 2022 she was concerned about a painful lump on her L AC fossa.She was fonud to have osteo of the clavicle.  She was treated with IV anbx for 8 weeks (dalbavancin).   Today Ms Lindsay French for pain in her rib cage (got a course of prednisone, mostly resolved except with deep breath or arching her back). 1/10.  Has been having fever at home to 99.9 (body aches, loss of appetite). Post prandial emesis.  Low energy, sleeps in a recliner. Having jaw pain with laying back. Dreams about "beds".  Is working at daycare still.  99.4 this am, then took tylenol.   HIV 1  RNA Quant (Copies/mL)  Date Value  06/26/2021 23 (H)  08/30/2020 <20 (H)  03/10/2020 <20   CD4 T Cell Abs (/uL)  Date Value  08/30/2020 664  03/10/2020 545  03/04/2019 773     Health Maintenance  Topic Date Due   FOOT EXAM  Never done   OPHTHALMOLOGY EXAM  Never done   Diabetic kidney evaluation - Urine ACR  Never done   TETANUS/TDAP  Never done   PAP SMEAR-Modifier  12/21/2014   COVID-19 Vaccine (3 - Moderna risk  series) 12/19/2019   INFLUENZA VACCINE  11/21/2021   HEMOGLOBIN A1C  07/02/2022   Diabetic kidney evaluation - GFR measurement  01/02/2023   Hepatitis C Screening  Completed   HIV Screening  Completed   HPV VACCINES  Aged Out      Review of Systems  Constitutional:  Positive for chills and fever. Negative for weight loss.  Respiratory:  Negative for cough and shortness of breath.        Pleuritic cp  Gastrointestinal:  Positive for nausea and vomiting. Negative for constipation and diarrhea.  Genitourinary:  Negative for dysuria.  Musculoskeletal:  Positive for myalgias.  Neurological:  Positive for dizziness (with standing).    Please see HPI. All other systems reviewed and negative.     Objective:  Physical Exam Vitals reviewed.  Constitutional:      Appearance: She is obese.  HENT:     Mouth/Throat:     Mouth: Mucous membranes are moist.     Pharynx: No oropharyngeal exudate.  Eyes:     Extraocular Movements: Extraocular movements intact.     Pupils: Pupils are equal, round, and reactive to light.  Cardiovascular:     Rate and Rhythm: Normal rate and regular rhythm.  Pulmonary:     Effort: Pulmonary effort is normal.     Breath sounds: Normal breath sounds.  Abdominal:     General: Bowel sounds are normal. There is no distension.     Palpations: Abdomen is soft.     Tenderness: There is no abdominal tenderness.  Musculoskeletal:     Cervical back: Normal range of motion and neck supple.     Right lower leg: No edema.     Left lower leg: No edema.  Neurological:     General: No focal deficit present.     Mental Status: She is alert.            Assessment & Plan:

## 2022-02-27 NOTE — Assessment & Plan Note (Signed)
Continue to f/u with wt loss clinic.

## 2022-02-27 NOTE — Assessment & Plan Note (Signed)
Will check her blood work today With her overall syndrome want to make sure her CD4 is normal As well will check her A1C, her cortisol and her TSH.

## 2022-02-27 NOTE — Assessment & Plan Note (Signed)
Her screen was abn, she will continue to f/u with her psychiatrist.

## 2022-02-27 NOTE — Assessment & Plan Note (Addendum)
Her DM has not been controlled yet. Her ozempic is still needing approval, did not tolerate metformin, trulicity did not work.  I fully stand by her getting ozempic.  She will f/u with her PCP.

## 2022-02-28 ENCOUNTER — Telehealth (INDEPENDENT_AMBULATORY_CARE_PROVIDER_SITE_OTHER): Payer: Commercial Managed Care - HMO | Admitting: Family Medicine

## 2022-02-28 ENCOUNTER — Encounter (INDEPENDENT_AMBULATORY_CARE_PROVIDER_SITE_OTHER): Payer: Self-pay | Admitting: Family Medicine

## 2022-02-28 LAB — T-HELPER CELLS (CD4) COUNT (NOT AT ARMC)
CD4 % Helper T Cell: 48 % (ref 33–65)
CD4 T Cell Abs: 963 /uL (ref 400–1790)

## 2022-02-28 LAB — LIPASE: Lipase: 19 U/L (ref 14–72)

## 2022-02-28 LAB — AMYLASE: Amylase: 14 U/L — ABNORMAL LOW (ref 31–110)

## 2022-03-01 LAB — CORTISOL: Cortisol: 22.5 ug/dL — ABNORMAL HIGH (ref 6.2–19.4)

## 2022-03-01 LAB — TSH: TSH: 2.83 u[IU]/mL (ref 0.450–4.500)

## 2022-03-01 LAB — HIV-1 RNA QUANT-NO REFLEX-BLD: HIV-1 RNA Viral Load: <20 copies/mL

## 2022-03-01 NOTE — Telephone Encounter (Signed)
Please advise 

## 2022-03-05 ENCOUNTER — Other Ambulatory Visit: Payer: Self-pay | Admitting: Infectious Diseases

## 2022-03-05 DIAGNOSIS — E1169 Type 2 diabetes mellitus with other specified complication: Secondary | ICD-10-CM

## 2022-03-05 DIAGNOSIS — E1159 Type 2 diabetes mellitus with other circulatory complications: Secondary | ICD-10-CM

## 2022-03-05 DIAGNOSIS — E1165 Type 2 diabetes mellitus with hyperglycemia: Secondary | ICD-10-CM

## 2022-03-08 ENCOUNTER — Other Ambulatory Visit: Payer: Self-pay | Admitting: Infectious Diseases

## 2022-03-08 DIAGNOSIS — R509 Fever, unspecified: Secondary | ICD-10-CM

## 2022-03-08 NOTE — Progress Notes (Signed)
Pt with continued c/o of fever (temp 99.9 but takes tylenol when higher). She has L upper rib pain.  She had CTA chest 3 weeks ago nl, now no sob, resting easily.  Asked her to go to ED but she defers.  Will send her for CT c/a/p.  Ddx- pancreatitis, VZV, costochondritis...Marland Kitchen Await CT

## 2022-03-09 ENCOUNTER — Other Ambulatory Visit: Payer: Commercial Managed Care - HMO

## 2022-03-12 ENCOUNTER — Inpatient Hospital Stay: Admission: RE | Admit: 2022-03-12 | Payer: Commercial Managed Care - HMO | Source: Ambulatory Visit

## 2022-03-27 ENCOUNTER — Ambulatory Visit (INDEPENDENT_AMBULATORY_CARE_PROVIDER_SITE_OTHER): Payer: Commercial Managed Care - HMO | Admitting: Infectious Diseases

## 2022-03-27 DIAGNOSIS — Z113 Encounter for screening for infections with a predominantly sexual mode of transmission: Secondary | ICD-10-CM | POA: Diagnosis not present

## 2022-03-27 DIAGNOSIS — B20 Human immunodeficiency virus [HIV] disease: Secondary | ICD-10-CM

## 2022-03-27 DIAGNOSIS — Z7985 Long-term (current) use of injectable non-insulin antidiabetic drugs: Secondary | ICD-10-CM

## 2022-03-27 DIAGNOSIS — Z7984 Long term (current) use of oral hypoglycemic drugs: Secondary | ICD-10-CM

## 2022-03-27 DIAGNOSIS — E1165 Type 2 diabetes mellitus with hyperglycemia: Secondary | ICD-10-CM | POA: Diagnosis not present

## 2022-03-27 DIAGNOSIS — Z79899 Other long term (current) drug therapy: Secondary | ICD-10-CM

## 2022-03-27 DIAGNOSIS — E785 Hyperlipidemia, unspecified: Secondary | ICD-10-CM

## 2022-03-27 DIAGNOSIS — E1169 Type 2 diabetes mellitus with other specified complication: Secondary | ICD-10-CM

## 2022-03-27 NOTE — Progress Notes (Signed)
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  With hx of LLE pain beginning 02-2021 AVN (MRI [place]) but also had erythema over her L shin. She was seen by [facility] and [facility]. I started her on keflex. 07-05-20 MRI at OSH: Geographic signal abnormality in the proximal tibia and distal fibular (partially imaged). Findings are similar nonspecific but most consistent with severe avascular necrosis. However, given degree of surrounding edema and periosteal reaction, unable to exclude infection or marrow infiltrative process (such as leukemia/lymphoma). Recommend correlation with laboratory data and any systemic sources predisposing patient to AVN (such as sickle cell disease). Depending on these findings, recommend short interval follow-up MRI which also includes the femur.    I saw her in ID f/u on 3-17- I sent her for urgent MRI (see results) due to worsening pain and swelling.  She continued on keflex with no improvement.  I reviewed her MRI on 3-18 and called her to come to hospital on 3-19 twice. She called me back on 3-21 and I encouraged her to come to the hospital due to the fluid collection on her MRI.  She was adm to hospital 3-21, her anbx were stopped, and on 07-15-20 underwent  I & D, partial excision L tibia and abscess. Her Cx grew MSSA She was d/c home on 3-28 with plan for IV ancef via PIC until 08-27-20. 5-10  She was transitioned to keflex 500mg  qid.  Returned (5-19) with worsening wound d/c. Worsened pain, into her arch and her medial knee. In addition to her keflex she had mupirocin oint added.   She was seen 09-15-20 with continued drainage,  medial knee is tender, stiff, limping. She cannot bear full wt.   She was sched for MRI which she had on 09-18-20:  1. Severe bone marrow edema and intramedullary enhancement involving the proximal left tibial metaphysis and diaphysis with  interval removal of the methylmethacrylate antibiotic beads. Central area of lack of enhancement likely reflecting necrosis versus surgical debris. Overall appearance is most consistent with osteomyelitis and postsurgical changes with a sinus track extending from the medullary cavity to the skin surface through a anterior cortical surgical defect. 2. Serpiginous signal abnormality in the distal left femoral diametaphysis consistent with a bone infarct with surrounding bone marrow edema and enhancement which may reflect osteomyelitis versus reactive edema.   She was seen in hospital 6-22 to 6-23 after having PIC malfunction, diarrhea. She had burning with having her K+ repleted and left before being seen by ID.  Dapto was not able to be covered by her insurance and she was changed to weekly dalbavancin infusions.    Aug 2022 she was concerned about a painful lump on her L AC fossa.She was fonud to have osteo of the clavicle.  She was treated with IV anbx for 8 weeks (dalbavancin).    In November,  Ms Karan c/o pain in her rib cage (got a course of prednisone, mostly resolved except with deep breath or arching her back). 1/10.  Has been having fever at home to 99.9 (body aches, loss of appetite). Post prandial emesis.  Low energy, sleeps in a recliner. Having jaw pain with laying back. She was sent for CT- c/a/p after her cortisol, TSH, CD4 were normal. She had a slightly depressed amylase. Scan was not done as her insurance was not going to cover these.   Fever has since resolved, her rib cage  pain has gone away as well. Occas cough improved with mucinex and humidifier. She had COVID test 12-3 (-).  Has started taking her mom's ozempic weekly (1/2 dose). She feels better, appetite is better, she has stopped going to wt loss clinic (I gained 90# while going there).  She did not have results with trulicity or metformin. The wt loss doctor is going to send her to endo.  She is 306# now on her  home scale. Was 312# while at a different doctor.  Is looking to lose wt to the point she can start exercising again (water aerobics). "Everything is difficult, uncomfortable". Work is ok- only has 2 kids right now.      Lab Results  Component Value Date   CD4TABS 963 02/27/2022   CD4TABS 664 08/30/2020   CD4TABS 545 03/10/2020     ROS  Physical Exam   1. Due to the national emergency this service was provided using telemedicine.  phone  2. Consent from the patient for the telehealth visit and that you identified patient with name and DOB.   3. Your locations, Pt and Provider- home and [facility]  4. Chief complaint for visit: wt loss  5. Document anyone else on the call: none  6. If the visit was a phone call, that you include the time you spent on the call: 15 minutes.

## 2022-03-27 NOTE — Assessment & Plan Note (Signed)
She is using her mom's ozempic and feels better.  Encouraged her.  She will be seen by endocrinology.

## 2022-03-27 NOTE — Assessment & Plan Note (Signed)
Will recheck her lipids at her f/u visit in 3 months Hopefully will get a better rx for her DM than using her mom's ozempic.  Will check A1C at her f/u visit.

## 2022-03-27 NOTE — Telephone Encounter (Signed)
Please review

## 2022-03-27 NOTE — Assessment & Plan Note (Signed)
She appears to be doing well.  Will see her in person in 3 months and check her labs then.  Will work on endo appt.  She is not going to f/u with weight loss clinic.

## 2022-03-28 ENCOUNTER — Encounter: Payer: Self-pay | Admitting: Infectious Diseases

## 2022-04-24 NOTE — Telephone Encounter (Signed)
Spoke with pt about peer:peer- ID# [id number] SR# [id number]  Phone [phone] [place] with her insurance co Covered alternatives:  [place]  Byeta Trulicity  Asked for appeal to start ozempic at 1mg  dose to continue her previous therapy. ICD E11.65 Coverage questions being faxed.

## 2022-05-01 ENCOUNTER — Other Ambulatory Visit (INDEPENDENT_AMBULATORY_CARE_PROVIDER_SITE_OTHER): Payer: Self-pay | Admitting: Family Medicine

## 2022-05-01 DIAGNOSIS — E1169 Type 2 diabetes mellitus with other specified complication: Secondary | ICD-10-CM

## 2022-05-01 DIAGNOSIS — E1159 Type 2 diabetes mellitus with other circulatory complications: Secondary | ICD-10-CM

## 2022-05-10 ENCOUNTER — Other Ambulatory Visit: Payer: Self-pay | Admitting: Infectious Diseases

## 2022-05-10 DIAGNOSIS — B20 Human immunodeficiency virus [HIV] disease: Secondary | ICD-10-CM

## 2022-05-10 MED ORDER — DOVATO 50-300 MG PO TABS: 1.0000 | ORAL_TABLET | Freq: Every day | ORAL | 3 refills | Status: DC

## 2022-05-18 ENCOUNTER — Other Ambulatory Visit: Payer: Self-pay | Admitting: Infectious Diseases

## 2022-05-18 DIAGNOSIS — E1165 Type 2 diabetes mellitus with hyperglycemia: Secondary | ICD-10-CM

## 2022-05-22 ENCOUNTER — Ambulatory Visit (INDEPENDENT_AMBULATORY_CARE_PROVIDER_SITE_OTHER): Payer: Commercial Managed Care - HMO | Admitting: Family Medicine

## 2022-05-22 ENCOUNTER — Encounter (INDEPENDENT_AMBULATORY_CARE_PROVIDER_SITE_OTHER): Payer: Self-pay | Admitting: Family Medicine

## 2022-05-22 VITALS — BP 115/63 | HR 125 | Temp 98.2°F | Ht 63.0 in

## 2022-05-22 DIAGNOSIS — E785 Hyperlipidemia, unspecified: Secondary | ICD-10-CM

## 2022-05-22 DIAGNOSIS — E1169 Type 2 diabetes mellitus with other specified complication: Secondary | ICD-10-CM

## 2022-05-22 DIAGNOSIS — E559 Vitamin D deficiency, unspecified: Secondary | ICD-10-CM

## 2022-05-22 DIAGNOSIS — Z6841 Body Mass Index (BMI) 40.0 and over, adult: Secondary | ICD-10-CM

## 2022-05-22 DIAGNOSIS — E1165 Type 2 diabetes mellitus with hyperglycemia: Secondary | ICD-10-CM

## 2022-05-22 DIAGNOSIS — Z7984 Long term (current) use of oral hypoglycemic drugs: Secondary | ICD-10-CM

## 2022-05-22 MED ORDER — ATORVASTATIN CALCIUM 10 MG PO TABS: 10.0000 mg | ORAL_TABLET | Freq: Every day | ORAL | 1 refills | Status: DC

## 2022-05-22 MED ORDER — VITAMIN D (ERGOCALCIFEROL) 1.25 MG (50000 UNIT) PO CAPS: 50000.0000 [IU] | ORAL_CAPSULE | ORAL | 0 refills | Status: DC

## 2022-05-22 MED ORDER — OZEMPIC (0.25 OR 0.5 MG/DOSE) 2 MG/3ML ~~LOC~~ SOPN: 0.2500 mg | PEN_INJECTOR | SUBCUTANEOUS | 0 refills | Status: DC

## 2022-05-31 NOTE — Progress Notes (Signed)
Chief Complaint:   OBESITY Lindsay French is here to discuss her progress with her obesity treatment plan along with follow-up of her obesity related diagnoses. Rumor is on practicing portion control and making smarter food choices, such as increasing vegetables and decreasing simple carbohydrates and states she is following her eating plan approximately 100% of the time. Lindsay French states she is exercising 0 minutes 0 times per week.  Today's visit was #: 9 Starting weight: 268 lbs Starting date: 08/01/2021 Today's weight: 306 lbs Today's date: 05/22/2022 Total lbs lost to date: 0 lbs Total lbs lost since last in-office visit: 0  Interim History: Lindsay French feels frustrated with medication management.  Insurance has unfortunately given her an extremely hard time and approving this medication.  Starting breakfast with 1.5 boiled eggs and chicken and cheese taquito.  Last night she had salmon and mashed potatoes and Brussels sprouts.  She is slowly increasing in size a meal but unfortunately is still both calorie and protein deficient.  Subjective:   1. Type 2 diabetes mellitus with hyperglycemia, without long-term current use of insulin (HCC) A1c at 7.1, increased from 6.7 while on metformin.  Prior to that she was on Trulicity which once again her A1c increased rather than decreased.  Unfortunately insurance has made coverage of Ozempic difficult.  Her mother did pay for 1 month of Ozempic which Lindsay French was able to tolerate.  2. Hyperlipidemia associated with type 2 diabetes mellitus ([place]) Lindsay French is on Lipitor.  Slight elevation of LFTs that could be multifactorial and not solely related to statin.  3. Vitamin D deficiency Lindsay French is not on Vitamin D.  She notes fatigue.  Assessment/Plan:   1. Type 2 diabetes mellitus with hyperglycemia, without long-term current use of insulin (HCC) Start Ozempic 0.25 mg SubQ once a week for 1 month with 0 refills.  -Start Semaglutide,0.25 or 0.5MG/DOS, (OZEMPIC, 0.25  OR 0.5 MG/DOSE,) 2 MG/3ML SOPN; Inject 0.25 mg into the skin once a week.  Dispense: 3 mL; Refill: 0  2. Hyperlipidemia associated with type 2 diabetes mellitus (HCC) We will refill Lipitor 10 mg daily for 1 month with 0 refills.  -Refill atorvastatin (LIPITOR) 10 MG tablet; Take 1 tablet (10 mg total) by mouth daily.  Dispense: 90 tablet; Refill: 1  3. Vitamin D deficiency We will refill Vit D 50K IU once a week for 1 month with 0 refills.  -Refill Vitamin D, Ergocalciferol, (DRISDOL) 1.25 MG (50000 UNIT) CAPS capsule; Take 1 capsule (50,000 Units total) by mouth every 7 (seven) days.  Dispense: 12 capsule; Refill: 0  4. Morbid obesity ([place]), starting BMI of 47.4  5. BMI 50.0-59.9, adult [facility]) Lindsay French is currently in the action stage of change. As such, her goal is to continue with weight loss efforts. She has agreed to the Category 4 Plan.   Exercise goals: No exercise has been prescribed at this time.  Behavioral modification strategies: increasing lean protein intake, meal planning and cooking strategies, keeping healthy foods in the home, better snacking choices, and planning for success.  Lindsay French has agreed to follow-up with our clinic in 3 weeks. She was informed of the importance of frequent follow-up visits to maximize her success with intensive lifestyle modifications for her multiple health conditions.   Objective:   Blood pressure 115/63, pulse (!) 125, temperature 98.2 F (36.8 C), height 5' 3"$  (1.6 m), SpO2 94 %. Body mass index is 54.72 kg/m.  General: Cooperative, alert, well developed, in no acute distress. HEENT: Conjunctivae and  lids unremarkable. Cardiovascular: Regular rhythm.  Lungs: Normal work of breathing. Neurologic: No focal deficits.   Lab Results  Component Value Date   CREATININE 0.53 (L) 01/01/2022   BUN 3 (L) 01/01/2022   NA 140 01/01/2022   K 4.6 01/01/2022   CL 100 01/01/2022   CO2 23 01/01/2022   Lab Results  Component Value Date   ALT 53  (H) 01/01/2022   AST 106 (H) 01/01/2022   ALKPHOS 114 01/01/2022   BILITOT <0.2 01/01/2022   Lab Results  Component Value Date   HGBA1C 7.1 (H) 01/01/2022   HGBA1C 6.7 (H) 08/01/2021   Lab Results  Component Value Date   INSULIN 99.2 (H) 01/01/2022   INSULIN 52.7 (H) 08/01/2021   Lab Results  Component Value Date   TSH 2.830 02/27/2022   Lab Results  Component Value Date   CHOL 156 01/01/2022   HDL 40 01/01/2022   LDLCALC 84 01/01/2022   TRIG 188 (H) 01/01/2022   CHOLHDL 5.2 (H) 06/26/2021   Lab Results  Component Value Date   VD25OH 23.4 (L) 01/01/2022   VD25OH 16.3 (L) 08/01/2021   Lab Results  Component Value Date   WBC 13.4 (H) 06/26/2021   HGB 12.6 06/26/2021   HCT 38.1 06/26/2021   MCV 93.6 06/26/2021   PLT 327 06/26/2021   No results found for: "IRON", "TIBC", "FERRITIN"  Attestation Statements:   Reviewed by clinician on day of visit: allergies, medications, problem list, medical history, surgical history, family history, social history, and previous encounter notes.  I, Elnora Morrison, RMA am acting as transcriptionist for Coralie Common, MD.  I have reviewed the above documentation for accuracy and completeness, and I agree with the above. - Coralie Common, MD

## 2022-06-05 ENCOUNTER — Other Ambulatory Visit: Payer: Self-pay | Admitting: Infectious Diseases

## 2022-06-05 DIAGNOSIS — J208 Acute bronchitis due to other specified organisms: Secondary | ICD-10-CM

## 2022-06-05 MED ORDER — ALBUTEROL SULFATE HFA 108 (90 BASE) MCG/ACT IN AERS: INHALATION_SPRAY | RESPIRATORY_TRACT | 0 refills | Status: AC

## 2022-06-05 MED ORDER — HYDROCODONE BIT-HOMATROP MBR 5-1.5 MG/5ML PO SOLN: 5.0000 mL | Freq: Four times a day (QID) | ORAL | 0 refills | Status: DC | PRN

## 2022-06-08 ENCOUNTER — Other Ambulatory Visit: Payer: Self-pay | Admitting: Infectious Diseases

## 2022-06-08 ENCOUNTER — Other Ambulatory Visit (INDEPENDENT_AMBULATORY_CARE_PROVIDER_SITE_OTHER): Payer: Self-pay | Admitting: Family Medicine

## 2022-06-08 DIAGNOSIS — J208 Acute bronchitis due to other specified organisms: Secondary | ICD-10-CM

## 2022-06-08 DIAGNOSIS — I152 Hypertension secondary to endocrine disorders: Secondary | ICD-10-CM

## 2022-06-08 MED ORDER — HYDROCODONE BIT-HOMATROP MBR 5-1.5 MG/5ML PO SOLN: 5.0000 mL | Freq: Four times a day (QID) | ORAL | 0 refills | Status: DC | PRN

## 2022-06-08 MED ORDER — HYDROCODONE BIT-HOMATROP MBR 5-1.5 MG PO TABS: 1.0000 | ORAL_TABLET | Freq: Three times a day (TID) | ORAL | 0 refills | Status: DC

## 2022-06-12 ENCOUNTER — Encounter: Payer: Self-pay | Admitting: Infectious Diseases

## 2022-06-12 ENCOUNTER — Ambulatory Visit (INDEPENDENT_AMBULATORY_CARE_PROVIDER_SITE_OTHER): Payer: Commercial Managed Care - HMO | Admitting: Infectious Diseases

## 2022-06-12 ENCOUNTER — Other Ambulatory Visit: Payer: Self-pay

## 2022-06-12 ENCOUNTER — Ambulatory Visit (INDEPENDENT_AMBULATORY_CARE_PROVIDER_SITE_OTHER): Payer: Commercial Managed Care - HMO | Admitting: Dietician

## 2022-06-12 VITALS — BP 125/80 | HR 104 | Temp 99.1°F | Resp 30 | Ht 63.0 in | Wt 324.7 lb

## 2022-06-12 DIAGNOSIS — Z87891 Personal history of nicotine dependence: Secondary | ICD-10-CM

## 2022-06-12 DIAGNOSIS — B20 Human immunodeficiency virus [HIV] disease: Secondary | ICD-10-CM

## 2022-06-12 DIAGNOSIS — E1165 Type 2 diabetes mellitus with hyperglycemia: Secondary | ICD-10-CM

## 2022-06-12 DIAGNOSIS — B370 Candidal stomatitis: Secondary | ICD-10-CM

## 2022-06-12 DIAGNOSIS — Z794 Long term (current) use of insulin: Secondary | ICD-10-CM | POA: Diagnosis not present

## 2022-06-12 DIAGNOSIS — J208 Acute bronchitis due to other specified organisms: Secondary | ICD-10-CM

## 2022-06-12 MED ORDER — PREDNISONE 10 MG (21) PO TBPK: ORAL_TABLET | ORAL | 0 refills | Status: DC

## 2022-06-12 MED ORDER — AZITHROMYCIN 250 MG PO TABS: ORAL_TABLET | ORAL | 0 refills | Status: AC

## 2022-06-12 MED ORDER — FLUCONAZOLE 100 MG PO TABS: 100.0000 mg | ORAL_TABLET | Freq: Every day | ORAL | 3 refills | Status: DC

## 2022-06-12 MED ORDER — BUDESONIDE-FORMOTEROL FUMARATE 80-4.5 MCG/ACT IN AERO: 2.0000 | INHALATION_SPRAY | Freq: Every day | RESPIRATORY_TRACT | 12 refills | Status: DC

## 2022-06-12 MED ORDER — TRULICITY 0.75 MG/0.5ML ~~LOC~~ SOAJ: 0.7500 mg | SUBCUTANEOUS | 0 refills | Status: AC

## 2022-06-12 NOTE — Assessment & Plan Note (Addendum)
She is intolerant of metformin Will try to get her trulicity (no hx of MEN) Meeting with Butch Penny today.  Referral for diabetic eye exam.

## 2022-06-12 NOTE — Assessment & Plan Note (Signed)
Will refill her fluconazole.

## 2022-06-12 NOTE — Progress Notes (Signed)
Subjective:    Patient ID: Lindsay French, female  DOB: Feb 22, 1981, 42 y.o.        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  With hx of LLE pain beginning 02-2021 AVN (MRI [place]) but also had erythema over her L shin. She was seen by [facility] and [facility]. I started her on keflex. 07-05-20 MRI at OSH: Geographic signal abnormality in the proximal tibia and distal fibular (partially imaged). Findings are similar nonspecific but most consistent with severe avascular necrosis. However, given degree of surrounding edema and periosteal reaction, unable to exclude infection or marrow infiltrative process (such as leukemia/lymphoma). Recommend correlation with laboratory data and any systemic sources predisposing patient to AVN (such as sickle cell disease). Depending on these findings, recommend short interval follow-up MRI which also includes the femur.    I saw her in ID f/u on 3-17- I sent her for urgent MRI (see results) due to worsening pain and swelling.  She continued on keflex with no improvement.  I reviewed her MRI on 3-18 and called her to come to hospital on 3-19 twice. She called me back on 3-21 and I encouraged her to come to the hospital due to the fluid collection on her MRI.  She was adm to hospital 3-21, her anbx were stopped, and on 07-15-20 underwent  I & D, partial excision L tibia and abscess. Her Cx grew MSSA She was d/c home on 3-28 with plan for IV ancef via PIC until 08-27-20. 5-10  She was transitioned to keflex 578m qid.  Returned (5-19) with worsening wound d/c. Worsened pain, into her arch and her medial knee. In addition to her keflex she had mupirocin oint added.   She was seen 09-15-20 with continued drainage,  medial knee is tender, stiff, limping. She cannot bear full wt.   She was sched for MRI which she had on 09-18-20:  1. Severe bone marrow edema and  intramedullary enhancement involving the proximal left tibial metaphysis and diaphysis with interval removal of the methylmethacrylate antibiotic beads. Central area of lack of enhancement likely reflecting necrosis versus surgical debris. Overall appearance is most consistent with osteomyelitis and postsurgical changes with a sinus track extending from the medullary cavity to the skin surface through a anterior cortical surgical defect. 2. Serpiginous signal abnormality in the distal left femoral diametaphysis consistent with a bone infarct with surrounding bone marrow edema and enhancement which may reflect osteomyelitis versus reactive edema.   She was seen in hospital 6-22 to 6-23 after having PIC malfunction, diarrhea. She had burning with having her K+ repleted and left before being seen by ID.  Dapto was not able to be covered by her insurance and she was changed to weekly dalbavancin infusions.    Aug 2022 she was concerned about a painful lump on her L AC fossa.She was fonud to have osteo of the clavicle.  She was treated with IV anbx for 8 weeks (dalbavancin).    She had previously been taking ozempic from her mother's supply.  Sh has been on maximal metformin and her A1C is 7.1 (12-2021). She has loose BM with metformin, is unable to get up off floor. [facility].   Today comes to clinic with worsening cough. I have communicated with her over the weekend, she had a new rx for hycodan as well as  her albuterol inhaler refilled.  She has been waking up coughing "so hard I can hardly inhale". Has constant headache from coughing.  Has had temps for 2 weeks, not over 100.  Has been sleeping sitting up due to nasal d/c, coughing.   Wt up 100# over last year.   HIV 1 RNA Quant (Copies/mL)  Date Value  06/26/2021 23 (H)  08/30/2020 <20 (H)  03/10/2020 <20   HIV-1 RNA Viral Load (copies/mL)  Date Value  02/27/2022 <20   CD4 T Cell Abs  (/uL)  Date Value  02/27/2022 963  08/30/2020 664  03/10/2020 545     Health Maintenance  Topic Date Due  . FOOT EXAM  Never done  . OPHTHALMOLOGY EXAM  Never done  . Diabetic kidney evaluation - Urine ACR  Never done  . DTaP/Tdap/Td (1 - Tdap) Never done  . PAP SMEAR-Modifier  12/21/2014  . COVID-19 Vaccine (3 - Moderna risk series) 12/19/2019  . INFLUENZA VACCINE  11/21/2021  . HEMOGLOBIN A1C  07/02/2022  . Diabetic kidney evaluation - eGFR measurement  01/02/2023  . Hepatitis C Screening  Completed  . HIV Screening  Completed  . HPV VACCINES  Aged Out      Review of Systems  Constitutional:  Negative for chills and fever.  Respiratory:  Positive for cough, sputum production, shortness of breath and wheezing.   Gastrointestinal:  Negative for constipation and diarrhea.  Genitourinary:  Negative for dysuria.  Neurological:  Positive for headaches.    Please see HPI. All other systems reviewed and negative.     Objective:  Physical Exam Vitals reviewed.  Constitutional:      Appearance: Normal appearance. She is obese.  HENT:     Mouth/Throat:     Mouth: Mucous membranes are moist.     Pharynx: Oropharyngeal exudate present.  Cardiovascular:     Rate and Rhythm: Normal rate and regular rhythm.  Pulmonary:     Effort: Pulmonary effort is normal. No respiratory distress.     Breath sounds: Decreased air movement present. No stridor. No wheezing or rhonchi.  Musculoskeletal:     Cervical back: Normal range of motion. No rigidity or tenderness.     Right lower leg: Edema present.     Left lower leg: Edema present.  Lymphadenopathy:     Cervical: No cervical adenopathy.  Neurological:     Mental Status: She is alert.           Assessment & Plan:

## 2022-06-12 NOTE — Assessment & Plan Note (Signed)
Appears to be doing well Will defer checking her labs due to intercurrent illness.

## 2022-06-12 NOTE — Assessment & Plan Note (Addendum)
Will start her on symbicort, continue albuterol prn rescue, burst of steroids, zpack,  She will call back by end of week if not feeling better.  I hear breath sounds in all quadrants, will defer CXR at this time.  Will check her for Flu/COVID/RSV.  We discuss this plan and she agrees.

## 2022-06-13 ENCOUNTER — Other Ambulatory Visit: Payer: Self-pay | Admitting: Infectious Diseases

## 2022-06-13 ENCOUNTER — Other Ambulatory Visit: Payer: Self-pay | Admitting: Dietician

## 2022-06-13 DIAGNOSIS — J208 Acute bronchitis due to other specified organisms: Secondary | ICD-10-CM

## 2022-06-13 DIAGNOSIS — E1165 Type 2 diabetes mellitus with hyperglycemia: Secondary | ICD-10-CM

## 2022-06-13 LAB — COVID-19, FLU A+B AND RSV
Influenza A, NAA: NOT DETECTED
Influenza B, NAA: NOT DETECTED
RSV, NAA: NOT DETECTED
SARS-CoV-2, NAA: NOT DETECTED

## 2022-06-13 MED ORDER — HYDROCODONE BIT-HOMATROP MBR 5-1.5 MG/5ML PO SOLN: 5.0000 mL | Freq: Four times a day (QID) | ORAL | 0 refills | Status: DC | PRN

## 2022-06-13 NOTE — Progress Notes (Signed)
Medical Nutrition Therapy:  Appt start time: 1600 end time:  1650. Total time: 50 minutes Visit # 1 (Late entry for 06/12/22) Assessment:  Primary concerns today: weight and diabetes.  Ms. Clent Demark was diagnosed with diabetes last year. She has not had education for it yet. She states she took Trulicity for the first 6 months and did not feel it lowered her blood sugar or weight, but not recent A1C to go by. She was then started on metformin but had to stop it because of diarrhea and stomach upset. Since then she says, she states her diabetes has been "untreated". She would like to have semaglutide, but it is not preferred by her insurance company.She has been going to a weight loss center, but state she did not like it or care to return. 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 Her diet recall reveals eating out at least 5 times a week, ultra processed foods with few vegetables, whole grains and ample fruit.   Her spouse helps her with it.  Preferred Learning Style: likes [employer] videos, etc- Auditory and Visual Learning Readiness: Contemplating  ANTHROPOMETRICS: Estimated body mass index is 57.52 kg/m as calculated from the following:   Height as of an earlier encounter on 06/12/22: 5' 3"$  (1.6 m).   Weight as of an earlier encounter on 06/12/22: 324 lb 11.2 oz (147.3 kg).  WEIGHT HISTORY: her office note from today states she is edematous Wt Readings from Last 20 Encounters:  06/12/22 (!) 324 lb 11.2 oz (147.3 kg)  02/27/22 (!) 308 lb 14.4 oz (140.1 kg)  01/01/22 (!) 303 lb (137.4 kg)  11/21/21 288 lb (130.6 kg)  10/31/21 284 lb (128.8 kg)  10/02/21 282 lb (127.9 kg)  09/12/21 276 lb (125.2 kg)  08/15/21 266 lb (120.7 kg)  08/01/21 268 lb (121.6 kg)  06/22/21 222 lb 9.6 oz (101 kg)  11/09/20 225 lb (102.1 kg)  10/21/20 235 lb (106.6 kg)  10/12/20 245 lb (111.1 kg)  09/20/20 243 lb (110.2 kg)  09/15/20 243 lb (110.2 kg)  09/08/20 236 lb  (107 kg)  08/30/20 232 lb (105.2 kg)  08/04/20 227 lb (103 kg)  07/27/20 233 lb 11 oz (106 kg)  07/15/20 234 lb (106.1 kg)   Verified her report that her weight was ~180 10 years ago.   [id number] she states she has never slept well her whole like, but has been sleeping in a recliner for past 3-4 years because she feels hat she cannot breathe when she lies down. She stats that she does not have a CPAP abd has never had a sleep study. She also states she will only have one if they can do it at home.   MEDICATIONS: she is starting prednisone for bronchitis, and trulicity for her diabetes, she states much of her weight 100# in the past year (after several rounds of antibiotics for her infected shin and clavicle BLOOD SUGAR: unsure if she checks her blood sugar at home Lab Results  Component Value Date   HGBA1C 7.1 (H) 01/01/2022   HGBA1C 6.7 (H) 08/01/2021     DIETARY INTAKE: suggests ~1800-2200 calories per day estimated meals eaten out ~ 1000 calories and recall is fairly accurate. Intake is likley higher per [facility]. Jeor calculations  Usual eating pattern includes 3 meals and 2-3 snacks per day. Everyday foods include fruits, sandwiches.  Need to assess at future visit: Avoided foods,  Food Intolerances,  Bowel habits/issues, hair loss: Dining Out (times/week): 5-6 24-hr recall:  B ( 7-9 AM): 1-2 clementines, string cheese x1, a ham and cheese omelet  or a store bought breakfast burrito  L ( 1130 PM): today- broccoli and cheese soup and bread from [employer], or other days has homemade trail mix w/ cranberries, pretzels, peanuts Snk ( PM): string cheese stick D ( PM): out 5 days a week and a sandwich the other two or chick fil a chicken noodle soup Snk ( PM): handful of trail mix Beverages: ~ 160 ounces of flavor water a day, 1-2 7-8 oz diet sodas per day and sometimes a Premier protein shake  Usual physical activity: limited by her weight, is in a wheel chair at this  time  Estimated daily energy needs using [employer] formula: Calculated TDEE: 2,531 calories/day Daily energy expenditures for various levels of activity. Basal Metabolic Rate (BMR) 0000000 little to no exercise 2,531 Per the NIH bodyweight planner:  In order to maintain your current weight, you should eat: 2,955  Calories/day To reach your goal of 299 lbs in 180 days,you should eat: 2,225 Calories/day  Progress Towards Goal(s):  In progress.   Nutritional Diagnosis:  NI-1.5 Excessive energy intake As related to consumption of highly processed, high calories foods and less activity.  As evidenced by her increased wight in past year and BMI of 57..    Intervention:  Nutrition education about processing of foods and the affect of highly processed foods on weight gain. . Action Goal: start trulicity and call for any concerns or questions  Outcome goal: improved  Coordination of care: consider bariatric surgery, work up for sleep issues, metr to self monitor her blood sugars while on prednisone  Teaching Method Utilized: Visual, Auditory,Hands on Handouts given during visit include:gave her NIH pamphlet on the affect of ultra processed foods on weight and explained it. offered where do I begin" for diabetes education but she says she would prefer U-Tube videos Barriers to learning/adherence to lifestyle change: competing values Demonstrated degree of understanding via:  Teach Back   Monitoring/Evaluation:  Dietary intake, exercise, meter, and body weight in 4 week(s) per hr request to meet on the same day she sees the doctor.  Debera Lat, RD 06/13/2022 9:50 AM. . Addendum called patient and explained steroid affect on blood sugars with people with Type 2 diabetes. She doe snot have a meter and would like one and agrees to watch a video to figure out how to use it and check her blood sugar twice daily while on steroids and call if two values over 200 mg/dL.  Also asked her about having  a regular eye doctor and she does not currently have one. Will let referral coordinator know.

## 2022-06-13 NOTE — Telephone Encounter (Signed)
Request glucometer and supplies

## 2022-06-14 ENCOUNTER — Ambulatory Visit (INDEPENDENT_AMBULATORY_CARE_PROVIDER_SITE_OTHER): Payer: Commercial Managed Care - HMO | Admitting: Family Medicine

## 2022-06-14 MED ORDER — ONETOUCH VERIO REFLECT W/DEVICE KIT: PACK | 1 refills | Status: DC

## 2022-06-14 MED ORDER — ONETOUCH VERIO VI STRP: ORAL_STRIP | 3 refills | Status: DC

## 2022-06-14 MED ORDER — ONETOUCH DELICA PLUS LANCET33G MISC: 3 refills | Status: DC

## 2022-06-14 NOTE — Telephone Encounter (Signed)
I spoke to patient to explain why meter going [facility]- per her insurance company. Notified her that if needed a  Sample has been left at the front desk for her. She verbalized understanding.

## 2022-07-04 ENCOUNTER — Ambulatory Visit: Payer: Commercial Managed Care - HMO | Admitting: Dietician

## 2022-07-10 ENCOUNTER — Encounter: Payer: Self-pay | Admitting: Infectious Diseases

## 2022-07-31 ENCOUNTER — Encounter: Payer: Self-pay | Admitting: Infectious Diseases

## 2022-07-31 ENCOUNTER — Ambulatory Visit (INDEPENDENT_AMBULATORY_CARE_PROVIDER_SITE_OTHER): Payer: 59 | Admitting: Infectious Diseases

## 2022-07-31 VITALS — BP 142/76 | HR 114 | Temp 98.5°F | Ht 62.0 in | Wt 321.6 lb

## 2022-07-31 DIAGNOSIS — B20 Human immunodeficiency virus [HIV] disease: Secondary | ICD-10-CM

## 2022-07-31 DIAGNOSIS — E1165 Type 2 diabetes mellitus with hyperglycemia: Secondary | ICD-10-CM

## 2022-07-31 DIAGNOSIS — E7800 Pure hypercholesterolemia, unspecified: Secondary | ICD-10-CM

## 2022-07-31 MED ORDER — SEMAGLUTIDE(0.25 OR 0.5MG/DOS) 2 MG/3ML ~~LOC~~ SOPN: 2.0000 mg | PEN_INJECTOR | SUBCUTANEOUS | 3 refills | Status: DC

## 2022-07-31 NOTE — Assessment & Plan Note (Signed)
Will continue to watch, eval as her Glc comes under better control.

## 2022-07-31 NOTE — Assessment & Plan Note (Signed)
She is adherent to meds.  Will recheck her labs when she is in less distress.

## 2022-07-31 NOTE — Assessment & Plan Note (Signed)
Very concerned about her sx and wt gain.  Will restart her ozempic with her new insurance.  Will see her back in 1 week.

## 2022-07-31 NOTE — Progress Notes (Signed)
Subjective:    Patient ID: Lindsay French, female  DOB: 1980/10/07, 42 y.o.        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  With hx of LLE pain beginning 02-2021 AVN (MRI [place]) but also had erythema over her L shin. She was seen by [facility] and [facility]. MRI at OSH: Geographic signal abnormality in the proximal tibia and distal fibular (partially imaged). Findings are similar nonspecific but most consistent with severe avascular necrosis. However, given degree of surrounding edema and periosteal reaction, unable to exclude infection or marrow infiltrative process (such as leukemia/lymphoma). Recommend correlation with laboratory data and any systemic sources predisposing patient to AVN (such as sickle cell disease). Depending on these findings, recommend short interval follow-up MRI which also includes the femur.    I saw her in ID f/u on 3-17- I sent her for urgent MRI (see results) due to worsening pain and swelling.  I reviewed her MRI on 3-18 and called her to come to hospital on 3-19 twice. She called me back on 3-21 and I encouraged her to come to the hospital due to the fluid collection on her MRI.  She was adm to hospital 3-21, her anbx were stopped, and on 07-15-20 underwent  I & D, partial excision L tibia and abscess. Her Cx grew MSSA She was d/c home on 3-28 with plan for IV ancef via PIC until 08-27-20. 5-10  She was transitioned to keflex 500mg  qid.  Returned (5-19) with worsening wound d/c. Worsened pain, into her arch and her medial knee. In addition to her keflex she had mupirocin oint added.   She was seen 09-15-20 with continued drainage,  medial knee is tender, stiff, limping. She cannot bear full wt.   She was sched for MRI which she had on 09-18-20:  1. Severe bone marrow edema and intramedullary enhancement involving the proximal left tibial metaphysis and  diaphysis with interval removal of the methylmethacrylate antibiotic beads. Central area of lack of enhancement likely reflecting necrosis versus surgical debris. Overall appearance is most consistent with osteomyelitis and postsurgical changes with a sinus track extending from the medullary cavity to the skin surface through a anterior cortical surgical defect. 2. Serpiginous signal abnormality in the distal left femoral diametaphysis consistent with a bone infarct with surrounding bone marrow edema and enhancement which may reflect osteomyelitis versus reactive edema. She was seen in hospital 6-22 to 6-23 after having PIC malfunction, diarrhea. She had burning with having her K+ repleted and left before being seen by ID.  Dapto was not able to be covered by her insurance and she was changed to weekly dalbavancin infusions.     Aug 2022 she was concerned about a painful lump on her L AC fossa.She was fonud to have osteo of the clavicle.  She was treated with IV anbx for 8 weeks (dalbavancin).    She had previously been taking ozempic from her mother's supply. Insurance would not cover this.  Sh has been on maximal metformin and her A1C is 7.1 (12-2021). She has loose BM with metformin, is unable to get up off floor.  We started her on trulicity but her insurance was canceled.  She now has new insurance.   Her FSG have remained elevated. She has been off injectables.  No problems with dovato, lipid rx, BP rx, anxiety, ADHD rx.  Thinks she has recurrence of thrush,  refilled her flucon.   Breathing is good except for DOE.  Feels unsteady moving up/down steps.   HIV 1 RNA Quant (Copies/mL)  Date Value  06/26/2021 23 (H)  08/30/2020 <20 (H)  03/10/2020 <20   HIV-1 RNA Viral Load (copies/mL)  Date Value  02/27/2022 <20   CD4 T Cell Abs (/uL)  Date Value  02/27/2022 963  08/30/2020 664  03/10/2020 545     Health Maintenance  Topic Date Due  . FOOT EXAM  Never done  .  OPHTHALMOLOGY EXAM  Never done  . Diabetic kidney evaluation - Urine ACR  Never done  . DTaP/Tdap/Td (1 - Tdap) Never done  . PAP SMEAR-Modifier  12/21/2014  . COVID-19 Vaccine (3 - Moderna risk series) 12/19/2019  . HEMOGLOBIN A1C  07/02/2022  . INFLUENZA VACCINE  11/22/2022  . Diabetic kidney evaluation - eGFR measurement  01/02/2023  . Hepatitis C Screening  Completed  . HIV Screening  Completed  . HPV VACCINES  Aged Out      Review of Systems  Constitutional:  Negative for chills, fever and weight loss.  Respiratory:  Positive for shortness of breath.   Gastrointestinal:  Positive for vomiting (post-prandial). Negative for constipation and diarrhea.  Genitourinary:  Negative for dysuria.       Nocturia, freq    Please see HPI. All other systems reviewed and negative.     Objective:  Physical Exam Vitals reviewed.  Constitutional:      General: She is not in acute distress.    Appearance: She is obese. She is not ill-appearing, toxic-appearing or diaphoretic.  HENT:     Mouth/Throat:     Mouth: Mucous membranes are moist.     Pharynx: No oropharyngeal exudate.  Eyes:     Extraocular Movements: Extraocular movements intact.     Pupils: Pupils are equal, round, and reactive to light.  Cardiovascular:     Rate and Rhythm: Normal rate and regular rhythm.  Pulmonary:     Effort: Pulmonary effort is normal.  Musculoskeletal:     Cervical back: Normal range of motion and neck supple.     Right lower leg: Edema present.     Left lower leg: Edema present.  Neurological:     General: No focal deficit present.     Mental Status: She is alert.           Assessment & Plan:

## 2022-08-07 ENCOUNTER — Ambulatory Visit (INDEPENDENT_AMBULATORY_CARE_PROVIDER_SITE_OTHER): Payer: 59 | Admitting: Infectious Diseases

## 2022-08-07 ENCOUNTER — Telehealth: Payer: Self-pay | Admitting: Infectious Diseases

## 2022-08-07 DIAGNOSIS — Z113 Encounter for screening for infections with a predominantly sexual mode of transmission: Secondary | ICD-10-CM

## 2022-08-07 DIAGNOSIS — B20 Human immunodeficiency virus [HIV] disease: Secondary | ICD-10-CM

## 2022-08-07 DIAGNOSIS — Z79899 Other long term (current) drug therapy: Secondary | ICD-10-CM

## 2022-08-07 NOTE — Progress Notes (Signed)
See documentation.

## 2022-08-07 NOTE — Telephone Encounter (Signed)
1. Due to the national emergency this service was provided using telemedicine.  phone  2. Consent from the patient for the telehealth visit and that you identified patient- name, dob  3. Your locations, Pt and Provider  4. Chief complaint for visit- f/u dm, and hiv  5. Document anyone else on the call- none  6. If the visit was a phone call, that you include the time you spent on the call: 10 minutes.   She is back on ozempeic.  She is on 0.25mg  /week. Explained her escalation schedule.  She had n/v some with first dose but now feels better.  Taking protein shakes as well.  Slept poorly due to "ADHD" Appetite and energy are good. Mild nausea on w/e (doses on Friday).  Going to try gummies for nausea.  Has not been checking her FSG. Advised her to check qday.  "Foggy brain" better. Will see her in person in 3 month 12-11-22 at 1:15 pm

## 2022-08-15 NOTE — Progress Notes (Signed)
12 minutes

## 2022-09-07 ENCOUNTER — Telehealth: Payer: Self-pay | Admitting: *Deleted

## 2022-09-07 NOTE — Telephone Encounter (Signed)
Received a call from [place] at [facility] requesting clarification on Ozempic. Sig states to inject 2 mg, but dose is for 0.25 or 0.5 mg. Please send updated Rx. Thanks.

## 2022-09-11 ENCOUNTER — Other Ambulatory Visit (HOSPITAL_COMMUNITY): Payer: Self-pay

## 2022-09-11 ENCOUNTER — Other Ambulatory Visit: Payer: Self-pay | Admitting: Infectious Diseases

## 2022-09-11 DIAGNOSIS — E1169 Type 2 diabetes mellitus with other specified complication: Secondary | ICD-10-CM

## 2022-09-11 DIAGNOSIS — E1165 Type 2 diabetes mellitus with hyperglycemia: Secondary | ICD-10-CM

## 2022-09-11 MED ORDER — SEMAGLUTIDE (2 MG/DOSE) 8 MG/3ML ~~LOC~~ SOPN: 2.0000 mg | PEN_INJECTOR | SUBCUTANEOUS | 3 refills | Status: DC

## 2022-09-13 ENCOUNTER — Telehealth: Payer: Self-pay | Admitting: Infectious Diseases

## 2022-09-13 NOTE — Telephone Encounter (Signed)
Left VM asking pt to make appt with IMTS clinic for DM mgmt.  Will try again.

## 2022-09-18 ENCOUNTER — Telehealth: Payer: Self-pay | Admitting: Infectious Diseases

## 2022-09-18 DIAGNOSIS — B20 Human immunodeficiency virus [HIV] disease: Secondary | ICD-10-CM

## 2022-09-18 DIAGNOSIS — Z113 Encounter for screening for infections with a predominantly sexual mode of transmission: Secondary | ICD-10-CM

## 2022-09-18 DIAGNOSIS — Z79899 Other long term (current) drug therapy: Secondary | ICD-10-CM

## 2022-09-18 DIAGNOSIS — E1165 Type 2 diabetes mellitus with hyperglycemia: Secondary | ICD-10-CM

## 2022-09-18 NOTE — Telephone Encounter (Signed)
Spoke with pt on phone  Her anorexia and h/v have improved.  Will check her lab work  She is amenable to getting seen in IM clinic for DM mgmt.

## 2022-09-19 ENCOUNTER — Other Ambulatory Visit: Payer: Self-pay | Admitting: Infectious Diseases

## 2022-09-19 DIAGNOSIS — I152 Hypertension secondary to endocrine disorders: Secondary | ICD-10-CM

## 2022-09-19 DIAGNOSIS — M25562 Pain in left knee: Secondary | ICD-10-CM

## 2022-09-19 DIAGNOSIS — E1165 Type 2 diabetes mellitus with hyperglycemia: Secondary | ICD-10-CM

## 2022-09-20 ENCOUNTER — Telehealth (INDEPENDENT_AMBULATORY_CARE_PROVIDER_SITE_OTHER): Payer: 59 | Admitting: Infectious Diseases

## 2022-09-20 DIAGNOSIS — R112 Nausea with vomiting, unspecified: Secondary | ICD-10-CM

## 2022-09-20 DIAGNOSIS — G8929 Other chronic pain: Secondary | ICD-10-CM | POA: Diagnosis not present

## 2022-09-20 DIAGNOSIS — M86462 Chronic osteomyelitis with draining sinus, left tibia and fibula: Secondary | ICD-10-CM

## 2022-09-20 DIAGNOSIS — E1169 Type 2 diabetes mellitus with other specified complication: Secondary | ICD-10-CM | POA: Diagnosis not present

## 2022-09-20 DIAGNOSIS — E1165 Type 2 diabetes mellitus with hyperglycemia: Secondary | ICD-10-CM

## 2022-09-20 DIAGNOSIS — M545 Low back pain, unspecified: Secondary | ICD-10-CM

## 2022-09-20 DIAGNOSIS — B20 Human immunodeficiency virus [HIV] disease: Secondary | ICD-10-CM

## 2022-09-20 MED ORDER — ONDANSETRON HCL 8 MG PO TABS: 8.0000 mg | ORAL_TABLET | Freq: Three times a day (TID) | ORAL | 1 refills | Status: DC | PRN

## 2022-09-20 NOTE — Assessment & Plan Note (Addendum)
Will work on getting her wheel chair from her insurance company.  For when she is out of house.  Patient suffers from back pain which impairs their ability to perform daily activities like shopping. A walker will not resolve issue with performing activities of daily living. A extra heavy weight (due to her weight) wheelchair will allow patient to safely perform daily activities. Patient can safely propel the wheelchair in the home and outside the home and has a caregiver who can provide assistance.

## 2022-09-20 NOTE — Assessment & Plan Note (Signed)
She agrees to be seen in IMTS clinic for DM mgmt.  FSG 148 taken during video visit.

## 2022-09-20 NOTE — Assessment & Plan Note (Signed)
Zofran refilled Not sure if this is part of her chronic problem or related to her ozempeic use.

## 2022-09-20 NOTE — Assessment & Plan Note (Signed)
Labs pending, will see her in clinic in 2 weeks.

## 2022-09-20 NOTE — Progress Notes (Signed)
Subjective:    Patient ID: Lindsay French, female  DOB: 1981-04-22, 42 y.o.        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  With hx of LLE pain beginning 02-2021 AVN (MRI [place]) but also had erythema over her L shin. She was seen by [facility] and [facility]. MRI at OSH: Geographic signal abnormality in the proximal tibia and distal fibular (partially imaged). Findings are similar nonspecific but most consistent with severe avascular necrosis. However, given degree of surrounding edema and periosteal reaction, unable to exclude infection or marrow infiltrative process (such as leukemia/lymphoma). Recommend correlation with laboratory data and any systemic sources predisposing patient to AVN (such as sickle cell disease). Depending on these findings, recommend short interval follow-up MRI which also includes the femur.    I saw her in ID f/u on 3-17- I sent her for urgent MRI (see results) due to worsening pain and swelling.  I reviewed her MRI on 3-18 and called her to come to hospital on 3-19 twice. She called me back on 3-21 and I encouraged her to come to the hospital due to the fluid collection on her MRI.  She was adm to hospital 3-21, her anbx were stopped, and on 07-15-20 underwent  I & D, partial excision L tibia and abscess. Her Cx grew MSSA She was d/c home on 3-28 with plan for IV ancef via PIC until 08-27-20. 5-10  She was transitioned to keflex 500mg  qid.  Returned (5-19) with worsening wound d/c. Worsened pain, into her arch and her medial knee. In addition to her keflex she had mupirocin oint added.   She was seen 09-15-20 with continued drainage,  medial knee is tender, stiff, limping. She cannot bear full wt.   She was sched for MRI which she had on 09-18-20:  1. Severe bone marrow edema and intramedullary enhancement involving the proximal left tibial  metaphysis and diaphysis with interval removal of the methylmethacrylate antibiotic beads. Central area of lack of enhancement likely reflecting necrosis versus surgical debris. Overall appearance is most consistent with osteomyelitis and postsurgical changes with a sinus track extending from the medullary cavity to the skin surface through a anterior cortical surgical defect. 2. Serpiginous signal abnormality in the distal left femoral diametaphysis consistent with a bone infarct with surrounding bone marrow edema and enhancement which may reflect osteomyelitis versus reactive edema. She was seen in hospital 6-22 to 6-23 after having PIC malfunction, diarrhea. She had burning with having her K+ repleted and left before being seen by ID.  Dapto was not able to be covered by her insurance and she was changed to weekly dalbavancin infusions.      Aug 2022 she was concerned about a painful lump on her L AC fossa.She was fonud to have osteo of the clavicle.  She was treated with IV anbx for 8 weeks (dalbavancin).    She had previously been taking ozempic from her mother's supply.  She is on 2mg /week ozempic. Her FSG is 148 currently.  She would like rx for nausea. She has previously taken zofran but not since 2022.  She is asking for a wheel chair for her upcoming vacation. She has DOE and back pain when out of her house doing adl's (shopping). Has had for 2 years. Numbness in lower back, across sacrum. No numbness in legs or feet. Burning pain that progresses to "  numbness and excruciating pain".  No incontinence except from stress incont with n/v.    Health Maintenance  Topic Date Due   FOOT EXAM  Never done   OPHTHALMOLOGY EXAM  Never done   Diabetic kidney evaluation - Urine ACR  Never done   DTaP/Tdap/Td (1 - Tdap) Never done   PAP SMEAR-Modifier  12/21/2014   COVID-19 Vaccine (3 - Moderna risk series) 12/19/2019   HEMOGLOBIN A1C  07/02/2022   INFLUENZA VACCINE  11/22/2022    Diabetic kidney evaluation - eGFR measurement  01/02/2023   Hepatitis C Screening  Completed   HIV Screening  Completed   HPV VACCINES  Aged Out      ROS [place], n/v. Stress incontinence  Please see HPI. All other systems reviewed and negative.     Objective:  Physical Exam  1. Due to the national emergency this service was provided using telemedicine. Video message.  2. Consent from the patient for the telehealth visit and that you identified patient name, DOB.   3. Your locations, Pt and Provider- IMTS clinic, pt's place of work/home.  4. Chief complaint for visit back pain.   5. 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   6. If the visit was a phone call, that you include the time you spent on the call: 13 minutes.          Assessment & Plan:

## 2022-09-26 ENCOUNTER — Ambulatory Visit: Payer: 59 | Admitting: Student

## 2022-10-08 ENCOUNTER — Telehealth: Payer: Self-pay | Admitting: Infectious Diseases

## 2022-10-08 NOTE — Telephone Encounter (Signed)
Orders were faxed to [employer] @ [phone] For the following  DME Wheelchair manual (Order [id number]) [employer]   Ordering/Authorizing: Ginnie Smart, MD

## 2022-12-11 ENCOUNTER — Ambulatory Visit: Payer: Self-pay | Admitting: Infectious Diseases

## 2022-12-19 ENCOUNTER — Ambulatory Visit: Payer: 59 | Admitting: Infectious Diseases

## 2022-12-22 ENCOUNTER — Encounter: Payer: Self-pay | Admitting: Infectious Diseases

## 2022-12-22 MED ORDER — AZITHROMYCIN 250 MG PO TABS: ORAL_TABLET | ORAL | 0 refills | Status: AC

## 2023-01-02 ENCOUNTER — Other Ambulatory Visit: Payer: Self-pay | Admitting: Infectious Diseases

## 2023-01-02 DIAGNOSIS — E1169 Type 2 diabetes mellitus with other specified complication: Secondary | ICD-10-CM

## 2023-01-02 DIAGNOSIS — E1165 Type 2 diabetes mellitus with hyperglycemia: Secondary | ICD-10-CM

## 2023-01-02 MED ORDER — SEMAGLUTIDE(0.25 OR 0.5MG/DOS) 2 MG/3ML ~~LOC~~ SOPN
2.0000 mg | PEN_INJECTOR | SUBCUTANEOUS | 3 refills | Status: DC
Start: 2023-01-02 — End: 2023-03-04

## 2023-02-12 ENCOUNTER — Ambulatory Visit: Payer: Commercial Managed Care - HMO | Admitting: Internal Medicine

## 2023-02-12 NOTE — Progress Notes (Deleted)
Name: Lindsay French  MRN/ DOB: [id number], 01-May-1980   Age/ Sex: 42 y.o., female    PCP: Ginnie Smart, MD   Reason for Endocrinology Evaluation: Type 2 Diabetes Mellitus     Date of Initial Endocrinology Visit: 02/12/2023     PATIENT IDENTIFIER: [name] is a 42 y.o. female with a past medical history of Dm, HTN, GERD,HIV,  hepatic steatosis and dyslipidemia . The patient presented for initial endocrinology clinic visit on 02/12/2023 for consultative assistance with her diabetes management.    HPI: [name] was    Diagnosed with DM *** Prior Medications tried/Intolerance: *** Currently checking blood sugars *** x / day,  before breakfast and ***.  Hypoglycemia episodes : ***               Symptoms: ***                 Frequency: ***/  Hemoglobin A1c has ranged from 6.7% in 2023, peaking at 7.1% in 2023.  In terms of diet, the patient ***   HOME DIABETES REGIMEN: Ozempic 2 mg weekly    Statin: yes ACE-I/ARB: no   METER DOWNLOAD SUMMARY: Date range evaluated: *** Fingerstick Blood Glucose Tests = *** Average Number Tests/Day = *** Overall Mean FS Glucose = *** Standard Deviation = ***  BG Ranges: Low = *** High = ***   Hypoglycemic Events/30 Days: BG < 50 = *** Episodes of symptomatic severe hypoglycemia = ***   DIABETIC COMPLICATIONS: Microvascular complications:  *** Denies: *** Last eye exam: Completed   Macrovascular complications:   Denies: CAD, PVD, CVA   PAST HISTORY: Past Medical History:  Past Medical History:  Diagnosis Date   ADHD    Anxiety    Asthma    usually with colds   Avascular necrosis (HCC)    Back pain    Depression    Elevated LFTs    Fatty liver 04/16/2016   Noted [place] ABD   GERD (gastroesophageal reflux disease)    Heart murmur    at birth, no issues   History of heat stroke    with syncope   History of thrombocytopenia    prior to diagnosis HIV   HIV (human immunodeficiency virus infection) (HCC)     Hyperlipidemia    Hypertension    Hypothyroidism    Infertility, female    Obese    PCOS (polycystic ovarian syndrome)    Pneumonia 04/17/2016   SOB (shortness of breath)    Wears partial dentures    upper   Wrist fracture 11/2017   Left   Past Surgical History:  Past Surgical History:  Procedure Laterality Date   HYSTEROSCOPY N/A 12/31/2017   Procedure: HYSTEROSCOPY and polypectomy;  Surgeon: Fermin Schwab, MD;  Location: [facility];  Service: Gynecology;  Laterality: N/A;   I & D EXTREMITY Left 07/15/2020   Procedure: PARTIAL EXCISION LEFT TIBIA;  Surgeon: [name], MD;  Location: [facility] OR;  Service: Orthopedics;  Laterality: Left;   IR FLUORO GUIDE CV LINE LEFT  09/26/2020   IR RADIOLOGIST EVAL & MGMT  08/12/2020   maxillofacial surgery      Social History:  reports that she has quit smoking. Her smoking use included cigarettes. She has a 4.5 pack-year smoking history. She has never used smokeless tobacco. She reports current alcohol use of about 7.0 standard drinks of alcohol per week. She reports that she does not currently use drugs after having used the following  drugs: "Crack" cocaine. Family History:  Family History  Problem Relation Age of Onset   Thyroid disease Mother    Hypothyroidism Mother    Depression Mother    Obesity Mother    Obesity Father    Depression Father    Hyperlipidemia Father    Hypertension Father      HOME MEDICATIONS: Allergies as of 02/12/2023       Reactions   Guaifenesin Nausea And Vomiting   Hydrocodone Itching   Not allergic 10/13/20   Hydrocodone-acetaminophen Hives   Lactose Intolerance (gi) Other (See Comments)   Upset stomach   Latex Other (See Comments)   As a teenager when using latex condoms she would get a yeast infection.    Ancef [cefazolin] Rash        Medication List        Accurate as of February 12, 2023 10:26 AM. If you have any questions, ask your nurse or doctor.          albuterol  108 (90 Base) MCG/ACT inhaler Commonly known as: VENTOLIN HFA INHALE 1 PUFF INTO THE LUNGS EVERY 6 HOURS AS NEEDED FOR WHEEZING OR SHORTNESS OF BREATH   allopurinol 100 MG tablet Commonly known as: ZYLOPRIM Take 1 tablet (100 mg total) by mouth 2 (two) times daily.   ALPRAZolam 0.5 MG tablet Commonly known as: XANAX Take 1 tablet (0.5 mg total) by mouth 2 (two) times daily as needed for anxiety.   amphetamine-dextroamphetamine 20 MG tablet Commonly known as: ADDERALL Take 20 mg by mouth daily.   atorvastatin 10 MG tablet Commonly known as: LIPITOR Take 1 tablet (10 mg total) by mouth daily.   budesonide-formoterol 80-4.5 MCG/ACT inhaler Commonly known as: Symbicort Inhale 2 puffs into the lungs daily.   [name] 50-300 MG tablet Generic drug: dolutegravir-lamiVUDine Take 1 tablet by mouth daily.   irbesartan 150 MG tablet Commonly known as: AVAPRO TAKE 1 TABLET(150 MG) BY MOUTH EVERY EVENING   multivitamin capsule Take 1 capsule by mouth daily.   ondansetron 8 MG tablet Commonly known as: ZOFRAN Take 1 tablet (8 mg total) by mouth every 8 (eight) hours as needed.   OneTouch Delica Plus Lancet33G Misc Use to check blood sugar twice daily while on steroids, once daily otherwise   OneTouch Verio Reflect w/Device Kit Use to check blood sugar twice daily while on steroids, once daily otherwise   OneTouch Verio test strip Generic drug: glucose blood Use to check blood sugar twice daily while on steroids, once daily otherwise   Semaglutide(0.25 or 0.5MG /DOS) 2 MG/3ML Sopn Inject 2 mg into the skin once a week.   Ozempic (2 MG/DOSE) 8 MG/3ML Sopn Generic drug: Semaglutide (2 MG/DOSE) INJECT 2 MG INTO THE SKIN ONCE A WEEK   Rexulti 2 MG Tabs tablet Generic drug: brexpiprazole Take 2 mg by mouth daily.   Vilazodone HCl 20 MG Tabs Commonly known as: Viibryd Take 1 tablet (20 mg total) by mouth daily.   Vitamin D (Ergocalciferol) 1.25 MG (50000 UNIT) Caps  capsule Commonly known as: DRISDOL Take 1 capsule (50,000 Units total) by mouth every 7 (seven) days.         ALLERGIES: Allergies  Allergen Reactions   Guaifenesin Nausea And Vomiting   Hydrocodone Itching    Not allergic 10/13/20   Hydrocodone-Acetaminophen Hives   Lactose Intolerance (Gi) Other (See Comments)    Upset stomach   Latex Other (See Comments)    As a teenager when using latex condoms she would get  a yeast infection.    Ancef [Cefazolin] Rash     REVIEW OF SYSTEMS: A comprehensive ROS was conducted with the patient and is negative except as per HPI     OBJECTIVE:   VITAL SIGNS: There were no vitals taken for this visit.   PHYSICAL EXAM:  General: Pt appears well and is in NAD  Neck: General: Supple without adenopathy or carotid bruits. Thyroid: Thyroid size normal.  No goiter or nodules appreciated.   Lungs: Clear with good BS bilat   Heart: RRR   Abdomen:  soft, nontender  Extremities:  Lower extremities - No pretibial edema.   Skin:  No rash noted.  Neuro: MS is good with appropriate affect, pt is alert and Ox3    DM foot exam:    DATA REVIEWED:  Lab Results  Component Value Date   HGBA1C 7.1 (H) 01/01/2022   HGBA1C 6.7 (H) 08/01/2021   Lab Results  Component Value Date   LDLCALC 84 01/01/2022   CREATININE 0.53 (L) 01/01/2022   No results found for: "MICRALBCREAT"  Lab Results  Component Value Date   CHOL 156 01/01/2022   HDL 40 01/01/2022   LDLCALC 84 01/01/2022   TRIG 188 (H) 01/01/2022   CHOLHDL 5.2 (H) 06/26/2021        ASSESSMENT / PLAN / RECOMMENDATIONS:   1) Type *** Diabetes Mellitus, ***controlled, With*** complications - Most recent A1c of *** %. Goal A1c < *** %.  ***  Plan: GENERAL: ***  MEDICATIONS: ***  EDUCATION / INSTRUCTIONS: BG monitoring instructions: Patient is instructed to check her blood sugars *** times a day, ***. Call [facility] if: BG persistently < 70  I reviewed the Rule  of 15 for the treatment of hypoglycemia in detail with the patient. Literature supplied.   2) Diabetic complications:  Eye: Does *** have known diabetic retinopathy.  Neuro/ Feet: Does *** have known diabetic peripheral neuropathy. Renal: Patient does *** have known baseline CKD. She is *** on an ACEI/ARB at present.  3) Lipids: Patient is *** on a statin.    4) Hypertension: ***  at goal of < 140/90 mmHg.       Signed electronically by: Lyndle Herrlich, MD  [facility]  [facility] [address] [place], [place] [address] Phone: [phone] FAX: [phone]   CC: Ginnie Smart, MD [address] [place] [address] Phone: [phone]  Fax: [phone]    Return to Endocrinology clinic as below: Future Appointments  Date Time Provider Department Center  02/12/2023  2:00 PM Brigitte Soderberg, Konrad Dolores, MD [facility] None

## 2023-02-22 ENCOUNTER — Other Ambulatory Visit: Payer: Self-pay | Admitting: Infectious Diseases

## 2023-02-22 DIAGNOSIS — W19XXXA Unspecified fall, initial encounter: Secondary | ICD-10-CM

## 2023-03-04 ENCOUNTER — Other Ambulatory Visit: Payer: Self-pay | Admitting: Infectious Diseases

## 2023-03-04 DIAGNOSIS — E1169 Type 2 diabetes mellitus with other specified complication: Secondary | ICD-10-CM

## 2023-03-04 DIAGNOSIS — E1165 Type 2 diabetes mellitus with hyperglycemia: Secondary | ICD-10-CM

## 2023-03-04 DIAGNOSIS — E66813 Obesity, class 3: Secondary | ICD-10-CM

## 2023-03-04 MED ORDER — OZEMPIC (2 MG/DOSE) 8 MG/3ML ~~LOC~~ SOPN: 2.0000 mg | PEN_INJECTOR | SUBCUTANEOUS | 1 refills | Status: AC

## 2023-03-04 MED ORDER — SEMAGLUTIDE(0.25 OR 0.5MG/DOS) 2 MG/3ML ~~LOC~~ SOPN: 2.0000 mg | PEN_INJECTOR | SUBCUTANEOUS | 3 refills | Status: DC

## 2023-03-15 ENCOUNTER — Other Ambulatory Visit: Payer: Self-pay | Admitting: Infectious Diseases

## 2023-03-15 DIAGNOSIS — B20 Human immunodeficiency virus [HIV] disease: Secondary | ICD-10-CM

## 2023-03-15 DIAGNOSIS — B3731 Acute candidiasis of vulva and vagina: Secondary | ICD-10-CM

## 2023-03-15 MED ORDER — DOVATO 50-300 MG PO TABS: 1.0000 | ORAL_TABLET | Freq: Every day | ORAL | 3 refills | Status: DC

## 2023-03-15 MED ORDER — FLUCONAZOLE 100 MG PO TABS: 100.0000 mg | ORAL_TABLET | Freq: Every day | ORAL | 1 refills | Status: AC

## 2023-04-26 ENCOUNTER — Telehealth: Payer: Self-pay | Admitting: Infectious Diseases

## 2023-04-26 DIAGNOSIS — F418 Other specified anxiety disorders: Secondary | ICD-10-CM

## 2023-04-26 MED ORDER — TEMAZEPAM 7.5 MG PO CAPS: 7.5000 mg | ORAL_CAPSULE | Freq: Every evening | ORAL | 0 refills | Status: DC | PRN

## 2023-04-26 NOTE — Telephone Encounter (Signed)
 Called pt (1:15pm) and left VM regarding text she sent that she is having hyperesthesia of L toes.   1:43- spoke with pt. Her workplace has been shutdown. She had worsening anxiety, ADHD since then. She was reffered by her psychiatrist to [facility] outpt psych.  She has lost her insurance and now has medicaid.   Her L great toe seemed black which she attributed to her socks. She picked it off and then noted bleeding. It grew back but now is having paresthesias in her feet, esp at night. States her FSG are good. Took last dose of ozempeic on 04-24-23.  Has given her anorexia, and when she is hungry she has n/v after eating (half of container of yogurt max).  Has lost wt and has been more active o/w.   She agrees to see dietician/DM practitioner when she comes in to see me next week.  She agrees that we need to wait til we get ozempeic out of her system.  Asks for sleep aid. Will send in low dose of restoril . Had been using xanax  to help her sleep.

## 2023-04-30 ENCOUNTER — Telehealth: Payer: Self-pay | Admitting: Podiatry

## 2023-04-30 ENCOUNTER — Ambulatory Visit (INDEPENDENT_AMBULATORY_CARE_PROVIDER_SITE_OTHER): Payer: 59 | Admitting: Podiatry

## 2023-04-30 ENCOUNTER — Ambulatory Visit: Payer: Self-pay

## 2023-04-30 ENCOUNTER — Encounter: Payer: Self-pay | Admitting: Podiatry

## 2023-04-30 ENCOUNTER — Ambulatory Visit: Payer: 59 | Admitting: Infectious Diseases

## 2023-04-30 DIAGNOSIS — G4700 Insomnia, unspecified: Secondary | ICD-10-CM

## 2023-04-30 DIAGNOSIS — E11628 Type 2 diabetes mellitus with other skin complications: Secondary | ICD-10-CM | POA: Insufficient documentation

## 2023-04-30 DIAGNOSIS — M86062 Acute hematogenous osteomyelitis, left tibia and fibula: Secondary | ICD-10-CM | POA: Diagnosis not present

## 2023-04-30 DIAGNOSIS — E1165 Type 2 diabetes mellitus with hyperglycemia: Secondary | ICD-10-CM

## 2023-04-30 DIAGNOSIS — E1169 Type 2 diabetes mellitus with other specified complication: Secondary | ICD-10-CM | POA: Diagnosis not present

## 2023-04-30 DIAGNOSIS — B370 Candidal stomatitis: Secondary | ICD-10-CM | POA: Diagnosis not present

## 2023-04-30 DIAGNOSIS — L600 Ingrowing nail: Secondary | ICD-10-CM

## 2023-04-30 DIAGNOSIS — Z7985 Long-term (current) use of injectable non-insulin antidiabetic drugs: Secondary | ICD-10-CM

## 2023-04-30 MED ORDER — LEVOFLOXACIN 500 MG PO TABS: 500.0000 mg | ORAL_TABLET | Freq: Every day | ORAL | 0 refills | Status: DC

## 2023-04-30 MED ORDER — TYLENOL PM EXTRA STRENGTH 500-25 MG PO TABS: 1.0000 | ORAL_TABLET | Freq: Every evening | ORAL | 3 refills | Status: DC | PRN

## 2023-04-30 MED ORDER — DOXYCYCLINE HYCLATE 100 MG PO TABS: 100.0000 mg | ORAL_TABLET | Freq: Two times a day (BID) | ORAL | 0 refills | Status: AC

## 2023-04-30 MED ORDER — HYDROCODONE-ACETAMINOPHEN 5-325 MG PO TABS: 1.0000 | ORAL_TABLET | Freq: Four times a day (QID) | ORAL | 0 refills | Status: AC | PRN

## 2023-04-30 MED ORDER — FLUCONAZOLE 100 MG PO TABS: 100.0000 mg | ORAL_TABLET | Freq: Every day | ORAL | 3 refills | Status: DC

## 2023-04-30 NOTE — Assessment & Plan Note (Addendum)
 Appreciate podiatry seeing her so rapidly.  Will add levaquin  to her doxy 14 days Will give her vicodin as no pain rx appears to have been written. [place] PMP reviewed. No other narcotics since this summer.  Asked her to call if she has any issues. (She has my cell #)

## 2023-04-30 NOTE — Progress Notes (Signed)
 Subjective:    Patient ID: Lindsay French, female  DOB: March 21, 1981, 43 y.o.        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 Breathing is good except for DOE.  She texted me over the weekend that she was feeling poorly, that her Glc were irregular and she was concerned that she was going to lose her ozempiec. Her mother helps her get this (sometimes using her rx).  I suggesed we get her in to see one of the [facility] docs for her sugar, she states she only wanted to see me.  She agreed to let me send in some trazadone to help her sleep and that she would see me 04-30-23.  Last pm she sent me a photo of her L great toe- swollen, erythematous, questionable d/c medially. I did not see it til this AM and got her in to see podiatry this AM. They removed the nail and gave her a rx for doxy.  I contacted her, have added levaquin  as well as diflucan  (at her request).  Currently she  c/o pain. She did not get any analgesic after the nail removal.  She states her sugars are back to good.  She has lost wt back to her original wt (down 60#).  Worried she can't eat enough calories. Has post-prandial emesis. 30g container of protein/yogurt is too much. Can only eat 1/2.  Her daycare is back open, she is hopeful that she will be more financially solvent.  The trazodone has not helped. She continues to prn xanax  for sleep. Her anxiety has been better, mostly at night.  Has used her husbands muscle relaxers but they make her groggy and unsteady the next day.  Has been having diarrhea for some time.  She is worried she has Erhler's Danlos as her sister does.    HIV 1 RNA Quant (Copies/mL)  Date Value  06/26/2021 23 (H)  08/30/2020 <20 (H)  03/10/2020 <20   HIV-1 RNA Viral Load (copies/mL)  Date Value  02/27/2022 <20   CD4 T Cell Abs (/uL)  Date Value  02/27/2022 963  08/30/2020 664  03/10/2020 545      Health Maintenance  Topic Date Due   FOOT EXAM  Never done   OPHTHALMOLOGY EXAM  Never done   Diabetic kidney evaluation - Urine ACR  Never done   DTaP/Tdap/Td (1 - Tdap) Never done   Cervical Cancer Screening (HPV/Pap Cotest)  12/21/2014   COVID-19 Vaccine (3 - Moderna risk series) 12/19/2019   HEMOGLOBIN A1C  07/02/2022   INFLUENZA VACCINE  11/22/2022   Diabetic kidney evaluation - eGFR measurement  01/02/2023   Hepatitis C Screening  Completed   HIV Screening  Completed   HPV VACCINES  Aged Out      ROS  Please see HPI. All other systems reviewed and negative.     Objective:  Physical Exam  1. Due to the national emergency this service was provided using telemedicine.  Phone.   2. Consent from the patient for the telehealth visit and that you identified patient- DOB- name  3. Your locations, Pt and Provider- home, [facility].  4. Chief complaint for visit infected toe nail  5. Document anyone else on the call- none  6. If the visit was a phone call, that you include the time you spent on the call: 13 minutes.  Assessment & Plan:

## 2023-04-30 NOTE — Assessment & Plan Note (Signed)
 Prophylactic rx for diflucan.

## 2023-04-30 NOTE — Assessment & Plan Note (Signed)
 Seems to be related to her anxiety.  Try to limit xanax use for sleep.  Will stop trazodone Will give her tylenol pm- spares her kidneys, she has issues with diarrhea so no concerns about constipation.

## 2023-04-30 NOTE — Patient Instructions (Signed)

## 2023-04-30 NOTE — Assessment & Plan Note (Signed)
 Some concern she is having sfx from ozempic.  Not sure she will be able to maintain long term.

## 2023-04-30 NOTE — Progress Notes (Signed)
 Subjective:  Patient ID: Lindsay French, female    DOB: 1980/09/23,   MRN: [id number]  No chief complaint on file.   43 y.o. female presents for concern of ingrown nail on left great toe that she relates has been present since August. Denies any treatments. No currently taking any antibioitcs . Patient is diabetic and last A1c was  Lab Results  Component Value Date   HGBA1C 7.1 (H) 01/01/2022   .   PCP:  Eben Reyes BROCKS, MD    . Denies any other pedal complaints. Denies n/v/f/c.   Past Medical History:  Diagnosis Date   ADHD    Anxiety    Asthma    usually with colds   Avascular necrosis (HCC)    Back pain    Depression    Elevated LFTs    Fatty liver 04/16/2016   Noted US  ABD   GERD (gastroesophageal reflux disease)    Heart murmur    at birth, no issues   History of heat stroke    with syncope   History of thrombocytopenia    prior to diagnosis HIV   HIV (human immunodeficiency virus infection) (HCC)    Hyperlipidemia    Hypertension    Hypothyroidism    Infertility, female    Obese    PCOS (polycystic ovarian syndrome)    Pneumonia 04/17/2016   SOB (shortness of breath)    Wears partial dentures    upper   Wrist fracture 11/2017   Left    Objective:  Physical Exam: Vascular: DP/PT pulses 2/4 bilateral. CFT <3 seconds. Normal hair growth on digits. No edema.  Skin. No lacerations or abrasions bilateral feet. Left hallux proximal nail fold with erythema edema and tenderness. Hallux nail thickened and dystrophic.  Musculoskeletal: MMT 5/5 bilateral lower extremities in DF, PF, Inversion and Eversion. Deceased ROM in DF of ankle joint.  Neurological: Sensation intact to light touch. Protective sensation diminished.   Assessment:   1. Ingrown left greater toenail   2. Type 2 diabetes mellitus with hyperglycemia, without long-term current use of insulin  (HCC)      Plan:  Patient was evaluated and treated and all questions answered. X-ray reviewed and no  concern for osseous erosions in the bone   Discussed ingrown toenails etiology and treatment options including procedure for removal vs conservative care.  Patient requesting removal of ingrown nail today. Procedure below.  Discussed procedure and post procedure care and patient expressed understanding. Discussed risks of healing and infection due to diabetes. Doxycycline  sent to pharmacy.  Will follow-up in 2 weeks for nail check or sooner if any problems arise.    Procedure:  Procedure: total Nail Avulsion of left hallux nail Surgeon: Asberry Failing, DPM  Pre-op Dx: Ingrown toenail with infection Post-op: Same  Place of Surgery: Office exam room.  Indications for surgery: Painful and ingrown toenail.    The patient is requesting removal of nail without  chemical matrixectomy. Risks and complications were discussed with the patient for which they understand and written consent was obtained. Under sterile conditions a total of 3 mL of  1% lidocaine  plain was infiltrated in a hallux block fashion. Once anesthetized, the skin was prepped in sterile fashion. A tourniquet was then applied. Next the entire left hallux nail was removed and area copiously irrigated. Silvadene was applied. A dry sterile dressing was applied. After application of the dressing the tourniquet was removed and there is found to be an immediate capillary refill time to the  digit. The patient tolerated the procedure well without any complications. Post procedure instructions were discussed the patient for which he verbally understood. Follow-up in two weeks for nail check or sooner if any problems are to arise. Discussed signs/symptoms of infection and directed to call the office immediately should any occur or go directly to the emergency room. In the meantime, encouraged to call the office with any questions, concerns, changes symptoms.   Asberry Failing, DPM

## 2023-04-30 NOTE — Telephone Encounter (Signed)
 Patient called stating she was in a lot of pain the medication used to numb her toe has worn off and would like you to send something in to her pharmacy, she has stated it is very painful to walk.

## 2023-05-07 ENCOUNTER — Encounter: Payer: 59 | Admitting: Infectious Diseases

## 2023-05-13 ENCOUNTER — Other Ambulatory Visit (HOSPITAL_COMMUNITY): Payer: Self-pay

## 2023-05-13 ENCOUNTER — Other Ambulatory Visit: Payer: Self-pay | Admitting: Infectious Diseases

## 2023-05-13 DIAGNOSIS — E1143 Type 2 diabetes mellitus with diabetic autonomic (poly)neuropathy: Secondary | ICD-10-CM

## 2023-05-13 MED ORDER — GABAPENTIN 300 MG PO CAPS: 300.0000 mg | ORAL_CAPSULE | Freq: Every day | ORAL | 3 refills | Status: DC

## 2023-05-13 NOTE — Progress Notes (Signed)
Pt with multiple texts over weekend regarding worsening pain in her feet.  Her prev wound is healing well.  I sent in neurontin 300mg  at bedtime #30 for her.  Let her know.  Ran price check [id number] with pharmacist.

## 2023-05-15 ENCOUNTER — Ambulatory Visit (INDEPENDENT_AMBULATORY_CARE_PROVIDER_SITE_OTHER): Payer: Self-pay | Admitting: Podiatry

## 2023-05-15 DIAGNOSIS — Z91199 Patient's noncompliance with other medical treatment and regimen due to unspecified reason: Secondary | ICD-10-CM

## 2023-05-15 NOTE — Progress Notes (Signed)
No show

## 2023-05-24 ENCOUNTER — Other Ambulatory Visit: Payer: Self-pay | Admitting: Infectious Diseases

## 2023-05-24 DIAGNOSIS — E1143 Type 2 diabetes mellitus with diabetic autonomic (poly)neuropathy: Secondary | ICD-10-CM

## 2023-05-24 MED ORDER — GABAPENTIN 300 MG PO CAPS: 600.0000 mg | ORAL_CAPSULE | Freq: Three times a day (TID) | ORAL | 3 refills | Status: DC

## 2023-05-26 ENCOUNTER — Emergency Department (HOSPITAL_COMMUNITY)
Admission: EM | Admit: 2023-05-26 | Discharge: 2023-05-26 | Disposition: A | Discharge status: Home / Self Care | Payer: Medicaid Other | Attending: Emergency Medicine | Admitting: Emergency Medicine

## 2023-05-26 ENCOUNTER — Other Ambulatory Visit: Payer: Self-pay

## 2023-05-26 ENCOUNTER — Encounter (HOSPITAL_COMMUNITY): Payer: Self-pay | Admitting: Emergency Medicine

## 2023-05-26 ENCOUNTER — Emergency Department (HOSPITAL_COMMUNITY): Payer: Medicaid Other

## 2023-05-26 DIAGNOSIS — S0181XA Laceration without foreign body of other part of head, initial encounter: Secondary | ICD-10-CM | POA: Insufficient documentation

## 2023-05-26 DIAGNOSIS — S92315A Nondisplaced fracture of first metatarsal bone, left foot, initial encounter for closed fracture: Secondary | ICD-10-CM | POA: Diagnosis not present

## 2023-05-26 DIAGNOSIS — W19XXXA Unspecified fall, initial encounter: Secondary | ICD-10-CM

## 2023-05-26 DIAGNOSIS — Z794 Long term (current) use of insulin: Secondary | ICD-10-CM | POA: Insufficient documentation

## 2023-05-26 DIAGNOSIS — S0990XA Unspecified injury of head, initial encounter: Secondary | ICD-10-CM

## 2023-05-26 DIAGNOSIS — Z91040 Latex allergy status: Secondary | ICD-10-CM | POA: Insufficient documentation

## 2023-05-26 DIAGNOSIS — S99922A Unspecified injury of left foot, initial encounter: Secondary | ICD-10-CM | POA: Diagnosis present

## 2023-05-26 DIAGNOSIS — Y92009 Unspecified place in unspecified non-institutional (private) residence as the place of occurrence of the external cause: Secondary | ICD-10-CM | POA: Insufficient documentation

## 2023-05-26 DIAGNOSIS — S92902A Unspecified fracture of left foot, initial encounter for closed fracture: Secondary | ICD-10-CM

## 2023-05-26 DIAGNOSIS — S93325A Dislocation of tarsometatarsal joint of left foot, initial encounter: Secondary | ICD-10-CM | POA: Insufficient documentation

## 2023-05-26 DIAGNOSIS — Z23 Encounter for immunization: Secondary | ICD-10-CM | POA: Insufficient documentation

## 2023-05-26 DIAGNOSIS — W130XXA Fall from, out of or through balcony, initial encounter: Secondary | ICD-10-CM | POA: Insufficient documentation

## 2023-05-26 MED ORDER — LIDOCAINE-EPINEPHRINE (PF) 2 %-1:200000 IJ SOLN
10.0000 mL | Freq: Once | INTRAMUSCULAR | Status: AC
  Administered 2023-05-26: 10 mL
  Filled 2023-05-26: qty 20

## 2023-05-26 MED ORDER — TETANUS-DIPHTH-ACELL PERTUSSIS 5-2.5-18.5 LF-MCG/0.5 IM SUSY
0.5000 mL | PREFILLED_SYRINGE | Freq: Once | INTRAMUSCULAR | Status: AC
  Administered 2023-05-26: 0.5 mL via INTRAMUSCULAR
  Filled 2023-05-26: qty 0.5

## 2023-05-26 MED ORDER — HYDROCODONE-ACETAMINOPHEN 5-325 MG PO TABS
1.0000 | ORAL_TABLET | Freq: Once | ORAL | Status: AC
  Administered 2023-05-26: 1 via ORAL
  Filled 2023-05-26: qty 1

## 2023-05-26 MED ORDER — HYDROCODONE-ACETAMINOPHEN 5-325 MG PO TABS: 2.0000 | ORAL_TABLET | ORAL | 0 refills | Status: DC | PRN

## 2023-05-26 NOTE — ED Notes (Signed)
Ortho tech at [facility] 

## 2023-05-26 NOTE — ED Notes (Signed)
EDP suturing at [facility]. Pt tolerating well, talkative. Family at [facility].

## 2023-05-26 NOTE — ED Triage Notes (Addendum)
Patient comes in via ems for fall off of porch. She hit pavor on left side of head, small laceration noted. Left foot pain with swelling noted.  No LOC noted

## 2023-05-26 NOTE — Discharge Instructions (Addendum)
Return for any problem.   Call Dr. Greig Right office tomorrow morning for appointment as instructed.   Do not bear weight on your left foot until cleared by Orthopedics.

## 2023-05-26 NOTE — ED Provider Notes (Signed)
[place] EMERGENCY DEPARTMENT AT [facility] Provider Note   CSN: [id number] Arrival date & time: 05/26/23  1512     History  Chief Complaint  Patient presents with   Fall   Foot Pain   Head Injury    Lindsay French is a 43 y.o. female.  43 year old female with prior medical history as detailed below presents for evaluation.  Patient reports accidental fall at home.  She tripped over the steps of her porch.  She did strike her head.  She has a laceration to the left forehead.  She also complains of pain to the left mid foot.  She is unsure of her last tetanus.  The history is provided by the patient and medical records.       Home Medications Prior to Admission medications   Medication Sig Start Date End Date Taking? Authorizing Provider  albuterol (VENTOLIN HFA) 108 (90 Base) MCG/ACT inhaler INHALE 1 PUFF INTO THE LUNGS EVERY 6 HOURS AS NEEDED FOR WHEEZING OR SHORTNESS OF BREATH 06/05/22   Ginnie Smart, MD  allopurinol (ZYLOPRIM) 100 MG tablet Take 1 tablet (100 mg total) by mouth 2 (two) times daily. 09/20/20   [name], MD  ALPRAZolam Prudy Feeler) 0.5 MG tablet Take 1 tablet (0.5 mg total) by mouth 2 (two) times daily as needed for anxiety. 06/22/21   Ginnie Smart, MD  amphetamine-dextroamphetamine (ADDERALL) 20 MG tablet Take 20 mg by mouth daily.    [provider]  atorvastatin (LIPITOR) 10 MG tablet Take 1 tablet (10 mg total) by mouth daily. 05/22/22   Langston Reusing, MD  Blood Glucose Monitoring Suppl [facility] VERIO REFLECT) w/Device KIT Use to check blood sugar twice daily while on steroids, once daily otherwise 06/14/22   Ginnie Smart, MD  brexpiprazole (REXULTI) 2 MG TABS tablet Take 2 mg by mouth daily.    [provider]  budesonide-formoterol (SYMBICORT) 80-4.5 MCG/ACT inhaler Inhale 2 puffs into the lungs daily. 06/12/22   Ginnie Smart, MD  diphenhydramine-acetaminophen (TYLENOL PM EXTRA STRENGTH) 25-500 MG  TABS tablet Take 1 tablet by mouth at bedtime as needed. 04/30/23   Ginnie Smart, MD  dolutegravir-lamiVUDine ([name]) 50-300 MG tablet Take 1 tablet by mouth daily. 03/15/23   Ginnie Smart, MD  fluconazole (DIFLUCAN) 100 MG tablet Take 1 tablet (100 mg total) by mouth daily. 04/30/23 06/25/23  Ginnie Smart, MD  gabapentin (NEURONTIN) 300 MG capsule Take 2 capsules (600 mg total) by mouth 3 (three) times daily. 05/24/23   Ginnie Smart, MD  glucose blood (ONETOUCH VERIO) test strip Use to check blood sugar twice daily while on steroids, once daily otherwise 06/14/22   Ginnie Smart, MD  irbesartan (AVAPRO) 150 MG tablet TAKE 1 TABLET(150 MG) BY MOUTH EVERY EVENING 06/08/22   Ginnie Smart, MD  Lancets [facility] DELICA PLUS [place]) MISC Use to check blood sugar twice daily while on steroids, once daily otherwise 06/14/22   Ginnie Smart, MD  levofloxacin (LEVAQUIN) 500 MG tablet Take 1 tablet (500 mg total) by mouth daily. 04/30/23   Ginnie Smart, MD  Multiple Vitamin (MULTIVITAMIN) capsule Take 1 capsule by mouth daily.    [provider]  ondansetron (ZOFRAN) 8 MG tablet Take 1 tablet (8 mg total) by mouth every 8 (eight) hours as needed. 09/20/22   Ginnie Smart, MD  Semaglutide, 2 MG/DOSE, (OZEMPIC, 2 MG/DOSE,) 8 MG/3ML SOPN Inject 2 mg into the skin once a week.  03/04/23 06/02/23  Ginnie Smart, MD  Semaglutide,0.25 or 0.5MG /DOS, 2 MG/3ML SOPN Inject 2 mg into the skin once a week. 03/04/23   Ginnie Smart, MD  temazepam (RESTORIL) 7.5 MG capsule Take 1 capsule (7.5 mg total) by mouth at bedtime as needed for sleep. 04/26/23   Ginnie Smart, MD  Vilazodone HCl (VIIBRYD) 20 MG TABS Take 1 tablet (20 mg total) by mouth daily. 11/24/20   Ginnie Smart, MD  Vitamin D, Ergocalciferol, (DRISDOL) 1.25 MG (50000 UNIT) CAPS capsule Take 1 capsule (50,000 Units total) by mouth every 7 (seven) days. 05/22/22   Langston Reusing, MD       Allergies    Guaifenesin, Hydrocodone, Hydrocodone-acetaminophen, Lactose intolerance (gi), Latex, and Ancef [cefazolin]    Review of Systems   Review of Systems  All other systems reviewed and are negative.   Physical Exam Updated Vital Signs BP 130/86 (BP Location: Left Arm)   Pulse 73   Temp 97.7 F (36.5 C) (Oral)   Resp 19   Ht 5\' 2"  (1.575 m)   Wt 122.5 kg   SpO2 100%   BMI 49.38 kg/m  Physical Exam Vitals and nursing note reviewed.  Constitutional:      General: She is not in acute distress.    Appearance: Normal appearance. She is well-developed.  HENT:     Head: Normocephalic.     Comments: 3 cm laceration to the left forehead. Eyes:     Conjunctiva/sclera: Conjunctivae normal.     Pupils: Pupils are equal, round, and reactive to light.  Cardiovascular:     Rate and Rhythm: Normal rate and regular rhythm.     Heart sounds: Normal heart sounds.  Pulmonary:     Effort: Pulmonary effort is normal. No respiratory distress.     Breath sounds: Normal breath sounds.  Abdominal:     General: There is no distension.     Palpations: Abdomen is soft.     Tenderness: There is no abdominal tenderness.  Musculoskeletal:        General: No deformity. Normal range of motion.     Cervical back: Normal range of motion and neck supple.     Comments: Diffuse tenderness and swelling to the left mid foot.  No break in the skin.  Skin:    General: Skin is warm and dry.  Neurological:     General: No focal deficit present.     Mental Status: She is alert and oriented to person, place, and time.     ED Results / Procedures / Treatments   Labs (all labs ordered are listed, but only abnormal results are displayed) Labs Reviewed - No data to display  EKG None  Radiology No results found.  Procedures .Laceration Repair  Date/Time: 05/26/2023 8:06 PM  Performed by: Wynetta Fines, MD Authorized by: Wynetta Fines, MD   Consent:    Consent obtained:  Verbal    Consent given by:  Patient   Risks, benefits, and alternatives were discussed: yes     Risks discussed:  Infection, need for additional repair, nerve damage, poor wound healing, poor cosmetic result, pain, retained foreign body, tendon damage and vascular damage   Alternatives discussed:  No treatment Universal protocol:    Immediately prior to procedure, a time out was called: yes     Patient identity confirmed:  Verbally with patient Anesthesia:    Anesthesia method:  Local infiltration   Local anesthetic:  Lidocaine 2% WITH  epi Laceration details:    Location:  Face   Face location:  Forehead   Length (cm):  3 Pre-procedure details:    Preparation:  Patient was prepped and draped in usual sterile fashion and imaging obtained to evaluate for foreign bodies Exploration:    Limited defect created (wound extended): no     Hemostasis achieved with:  Direct pressure   Imaging outcome: foreign body not noted     Wound extent: no foreign body and no underlying fracture     Contaminated: no   Treatment:    Area cleansed with:  Povidone-iodine   Amount of cleaning:  Extensive   Irrigation solution:  Sterile saline   Irrigation method:  Syringe   Visualized foreign bodies/material removed: no     Debridement:  None   Undermining:  None Skin repair:    Repair method:  Sutures   Suture size:  5-0   Wound skin closure material used: Vicryl.   Suture technique:  Simple interrupted Approximation:    Approximation:  Close Repair type:    Repair type:  Simple Post-procedure details:    Dressing:  Open (no dressing)   Procedure completion:  Tolerated     Medications Ordered in ED Medications  Tdap (BOOSTRIX) injection 0.5 mL (has no administration in time range)  lidocaine-EPINEPHrine (XYLOCAINE W/EPI) 2 %-1:200000 (PF) injection 10 mL (has no administration in time range)    ED Course/ Medical Decision Making/ A&P                                 Medical Decision Making Amount  and/or Complexity of Data Reviewed Radiology: ordered.  Risk Prescription drug management.    Medical Screen Complete  This patient presented to the ED with complaint of fall, head injury, foot injury.  This complaint involves an extensive number of treatment options. The initial differential diagnosis includes, but is not limited to, trauma from fall  This presentation is: Acute, Self-Limited, Previously Undiagnosed, Uncertain Prognosis, Complicated, Systemic Symptoms, and Threat to Life/Bodily Function  Patient presents after a fall at home.  She struck her head and has a forehead laceration.  Laceration repaired without difficulty.  Patient tolerated this well.  Patient also with injury to the left foot.  Imaging suggest Lisfranc fracture of the left foot.  Case and imaging discussed with Dr. Eulah Pont.  Patient splinted.  Patient to follow-up with Dr. Eulah Pont tomorrow in the clinic.  Patient understands need for close follow-up with orthopedics.  Strict return precautions given and understood.  Additional history obtained:  External records from outside sources obtained and reviewed including prior ED visits and prior Inpatient records.   Imaging Studies ordered:  I ordered imaging studies including CT scans of head and left foot, plain films of the left foot I independently visualized and interpreted obtained imaging which showed likely Lisfranc left foot I agree with the radiologist interpretation. Problem List / ED Course:  Fall, head injury, forehead laceration, left foot fractures   Reevaluation:  After the interventions noted above, I reevaluated the patient and found that they have: improved   Disposition:  After consideration of the diagnostic results and the patients response to treatment, I feel that the patent would benefit from close outpatient follow-up.          Final Clinical Impression(s) / ED Diagnoses Final diagnoses:  Fall, initial  encounter  Injury of head, initial encounter  Laceration of  forehead, initial encounter  Closed fracture of left foot, initial encounter    Rx / DC Orders ED Discharge Orders          Ordered    HYDROcodone-acetaminophen (NORCO/VICODIN) 5-325 MG tablet  Every 4 hours PRN        05/26/23 1756              Wynetta Fines, MD 05/26/23 2013

## 2023-05-26 NOTE — ED Notes (Signed)
Back from xray. Alert, NAD, calm, interactive. No changes. Family at [facility].

## 2023-05-26 NOTE — Progress Notes (Signed)
Orthopedic Tech Progress Note Patient Details:  Lindsay French [birth year] [id number]  Ortho Devices Type of Ortho Device: Post (short leg) splint, Stirrup splint Ortho Device/Splint Location: LLE/extra padding on anterior and pedal portion of foot Ortho Device/Splint Interventions: Ordered, Application, Adjustment   Post Interventions Patient Tolerated: Well Instructions Provided: Care of device  Tonye Pearson 05/26/2023, 5:58 PM

## 2023-05-26 NOTE — ED Notes (Signed)
To CT by stretcher. Pt alert, NAD, calm, interactive.

## 2023-05-28 ENCOUNTER — Emergency Department (HOSPITAL_COMMUNITY)
Admission: EM | Admit: 2023-05-28 | Discharge: 2023-05-28 | Disposition: A | Discharge status: Home / Self Care | Payer: Medicaid Other | Attending: Emergency Medicine | Admitting: Emergency Medicine

## 2023-05-28 ENCOUNTER — Encounter (HOSPITAL_COMMUNITY): Payer: Self-pay

## 2023-05-28 ENCOUNTER — Other Ambulatory Visit: Payer: Self-pay

## 2023-05-28 DIAGNOSIS — Z794 Long term (current) use of insulin: Secondary | ICD-10-CM | POA: Diagnosis not present

## 2023-05-28 DIAGNOSIS — M79672 Pain in left foot: Secondary | ICD-10-CM | POA: Diagnosis present

## 2023-05-28 DIAGNOSIS — Z91040 Latex allergy status: Secondary | ICD-10-CM | POA: Diagnosis not present

## 2023-05-28 MED ORDER — OXYCODONE-ACETAMINOPHEN 5-325 MG PO TABS
2.0000 | ORAL_TABLET | Freq: Once | ORAL | Status: AC
Start: 2023-05-28 — End: 2023-05-28
  Administered 2023-05-28: 2 via ORAL
  Filled 2023-05-28: qty 2

## 2023-05-28 MED ORDER — MORPHINE SULFATE (PF) 4 MG/ML IV SOLN: 8.0000 mg | Freq: Once | INTRAVENOUS | Status: DC

## 2023-05-28 MED ORDER — DIPHENHYDRAMINE HCL 25 MG PO CAPS
25.0000 mg | ORAL_CAPSULE | Freq: Once | ORAL | Status: AC
  Administered 2023-05-28: 25 mg via ORAL
  Filled 2023-05-28: qty 1

## 2023-05-28 MED ORDER — OXYCODONE-ACETAMINOPHEN 5-325 MG PO TABS: 1.0000 | ORAL_TABLET | Freq: Four times a day (QID) | ORAL | 0 refills | Status: DC | PRN

## 2023-05-28 NOTE — Care Management (Addendum)
 Transition of Care [facility]) - Emergency Department Mini Assessment   Patient Details  Name: Lindsay French MRN: [id number] Date of Birth: 07/12/1980  Transition of Care [facility]) CM/SW Contact:    Stefan JONELLE Cory, RN Phone Number: 05/28/2023, 5:56 PM   Clinical Narrative: This RNCM spoke with patient at bedside to advise due to being uninsured she will have to pay out of pocket for short term SNF, private duty nursing services. Patient reports she lives with her husband and he I unable to assist her with care. Patient reports she has a follow up appointment tomorrow at 9:15am with Dr.Odara. Patient reports her mother will be in town on Saturday and request to have STR for 1 day. Patient became tearful indicating she plans to file her taxes and apply for [employer] assistance due to HIV.  This RNCM notified Cindie with [employer] for assistance with charity HHPT. Cindie report as long as patient meets financial guidelines she should qualify. [facility] representative will speak with patient on tomorrow.  No additional TOC needs.   ED Mini Assessment: What brought you to the Emergency Department? : c/o fall 3 days ago  Barriers to Discharge: Inadequate or no insurance  Barrier interventions: assist with coordinating home health services  Means of departure: Ambulance  Interventions which prevented an admission or readmission: Home Health Consult or Services    Patient Contact and Communications        ,          Patient states their goals for this hospitalization and ongoing recovery are:: to get assistance CMS Medicare.gov Compare Post Acute Care list provided to:: Patient (n/a)    Admission diagnosis:  foot pain from prev fracture, fall Patient Active Problem List   Diagnosis Date Noted   Diabetic foot infection (HCC) 04/30/2023   Insomnia 04/30/2023   Thrush 06/12/2022   Depression with anxiety 01/29/2022   At risk for heart disease 09/20/2021   Folate deficiency 08/25/2021   Vitamin  D deficiency 08/25/2021   Hyperlipidemia associated with type 2 diabetes mellitus (HCC) 08/25/2021   Type 2 diabetes mellitus with hyperglycemia, without long-term current use of insulin  (HCC) 08/25/2021   Hypertension associated with type 2 diabetes mellitus (HCC) 08/25/2021   At risk for hyperglycemia 08/25/2021   Dental infection 10/21/2020   Left upper extremity swelling 10/21/2020   Intractable nausea and vomiting 10/12/2020   Chronic narcotic use 08/30/2020   Arthralgia of left lower leg    Cellulitis of left leg    Severe protein-calorie malnutrition (HCC)    Hypothyroidism    GERD (gastroesophageal reflux disease)    Hepatic steatosis    Thrombocytosis    Macrocytic anemia    Back pain 03/24/2019   Infertility, female 03/24/2019   PCOS (polycystic ovarian syndrome) 03/24/2019   Human immunodeficiency virus (HIV) disease (HCC) 04/24/2018   Health care maintenance 04/24/2018   Hyperlipidemia 04/10/2017   Acute respiratory failure with hypoxia (HCC) 04/16/2016   Acute bronchitis 04/16/2016   Hypertension 04/16/2016   Class 3 obesity 03/21/2016   Screening examination for venereal disease 03/30/2014   Encounter for long-term (current) use of medications 03/30/2014   Idiopathic thrombocytopenic purpura (ITP) (HCC) 11/14/2010   ADHD (attention deficit hyperactivity disorder) 11/14/2010   PCP:  Eben Reyes BROCKS, MD Pharmacy:   [facility] [phone] - [place], [place] - [address] AT [facility] [address] [place] [address] Phone: [phone] Fax: [phone]  [facility] -  [place], [place] - [address] [place] [address] Phone: [phone] Fax: [phone]  [id number] [place], [place] - [address] AT [facility] [address] [place] [address] Phone: [phone] Fax: [phone]

## 2023-05-28 NOTE — Discharge Instructions (Addendum)
Home health will contact you tomorrow take the pain medications as directed and keep your appointment with the orthopedist

## 2023-05-28 NOTE — Care Management (Signed)
 This RNCM received call from EDP who reports patient is not medically appropriate for inpt admission. Per chart review patient had fall 3 days ago. Patient does not have medical insurance which is a barrier to SNF placement and HH services.  Will notify evening ED SW to follow patient.    TOC following.

## 2023-05-28 NOTE — Progress Notes (Signed)
 CSW spoke with patient who came to the ED requesting SNF. CSW told patient that she would need insurance or pay out of pocket for placement. CSW told patient that monthly it would be 8,000-10,000 dollars. Patient stated she only needs to go to SNF for one night. CSW told patient that the SNF's need at least 30 days of payment up front. CSW asked patient if she had any friends or family that can assist her. Patient stated that her husband cannot lift her and her mother is flying in from [place]  to help her. Patient also told CSW she has an appointment with the orthopedic surgeon tomorrow. CSW told patient that she may have to discharge home. Patient voiced understanding of the possible discharge.

## 2023-05-28 NOTE — ED Provider Notes (Signed)
 [place] EMERGENCY DEPARTMENT AT [facility] Provider Note   CSN: [id number] Arrival date & time: 05/28/23  1548     History  Chief Complaint  Patient presents with   Foot Pain   Failure To Thrive    Lindsay French is a 43 y.o. female.  43 year old female presents due to increased pain and inability for care for self.  Seen here few days ago after a fall.  Was diagnosed with Lisfranc fracture.  Was given orthopedic referral.  States that her pain continues in her foot.  Also notes that she has not been her body strength and cannot take care of herself at home.  Notes slight diarrhea.  Denies any fever or chills.  She unfortunately is uninsured and has a husband at home who cannot help out.       Home Medications Prior to Admission medications   Medication Sig Start Date End Date Taking? Authorizing Provider  albuterol  (VENTOLIN  HFA) 108 (90 Base) MCG/ACT inhaler INHALE 1 PUFF INTO THE LUNGS EVERY 6 HOURS AS NEEDED FOR WHEEZING OR SHORTNESS OF BREATH 06/05/22   Eben Reyes BROCKS, MD  allopurinol  (ZYLOPRIM ) 100 MG tablet Take 1 tablet (100 mg total) by mouth 2 (two) times daily. 09/20/20   Harden Jerona GAILS, MD  ALPRAZolam  (XANAX ) 0.5 MG tablet Take 1 tablet (0.5 mg total) by mouth 2 (two) times daily as needed for anxiety. 06/22/21   Eben Reyes BROCKS, MD  amphetamine -dextroamphetamine  (ADDERALL ) 20 MG tablet Take 20 mg by mouth daily.    [provider]  atorvastatin  (LIPITOR) 10 MG tablet Take 1 tablet (10 mg total) by mouth daily. 05/22/22   Berkeley Adelita PENNER, MD  Blood Glucose Monitoring [facility] VERIO REFLECT) w/Device KIT Use to check blood sugar twice daily while on steroids, once daily otherwise 06/14/22   Eben Reyes BROCKS, MD  brexpiprazole (REXULTI) 2 MG TABS tablet Take 2 mg by mouth daily.    [provider]  budesonide -formoterol  (SYMBICORT ) 80-4.5 MCG/ACT inhaler Inhale 2 puffs into the lungs daily. 06/12/22   Eben Reyes BROCKS, MD   diphenhydramine -acetaminophen  (TYLENOL  PM EXTRA STRENGTH) 25-500 MG TABS tablet Take 1 tablet by mouth at bedtime as needed. 04/30/23   Eben Reyes BROCKS, MD  dolutegravir -lamiVUDine  ([name] ) 50-300 MG tablet Take 1 tablet by mouth daily. 03/15/23   Eben Reyes BROCKS, MD  fluconazole  (DIFLUCAN ) 100 MG tablet Take 1 tablet (100 mg total) by mouth daily. 04/30/23 06/25/23  Eben Reyes BROCKS, MD  gabapentin  (NEURONTIN ) 300 MG capsule Take 2 capsules (600 mg total) by mouth 3 (three) times daily. 05/24/23   Eben Reyes BROCKS, MD  glucose blood (ONETOUCH VERIO) test strip Use to check blood sugar twice daily while on steroids, once daily otherwise 06/14/22   Eben Reyes BROCKS, MD  HYDROcodone -acetaminophen  (NORCO/VICODIN) 5-325 MG tablet Take 2 tablets by mouth every 4 (four) hours as needed. 05/26/23   Laurice Maude BROCKS, MD  irbesartan  (AVAPRO ) 150 MG tablet TAKE 1 TABLET(150 MG) BY MOUTH EVERY EVENING 06/08/22   Eben Reyes BROCKS, MD  Lancets [facility] DELICA PLUS [place]) MISC Use to check blood sugar twice daily while on steroids, once daily otherwise 06/14/22   Eben Reyes BROCKS, MD  levofloxacin  (LEVAQUIN ) 500 MG tablet Take 1 tablet (500 mg total) by mouth daily. 04/30/23   Eben Reyes BROCKS, MD  Multiple Vitamin (MULTIVITAMIN) capsule Take 1 capsule by mouth daily.    [provider]  ondansetron  (ZOFRAN ) 8 MG tablet Take 1 tablet (8 mg  total) by mouth every 8 (eight) hours as needed. 09/20/22   Eben Reyes BROCKS, MD  Semaglutide , 2 MG/DOSE, (OZEMPIC , 2 MG/DOSE,) 8 MG/3ML SOPN Inject 2 mg into the skin once a week. 03/04/23 06/02/23  Eben Reyes BROCKS, MD  Semaglutide ,0.25 or 0.5MG /DOS, 2 MG/3ML SOPN Inject 2 mg into the skin once a week. 03/04/23   Eben Reyes BROCKS, MD  temazepam  (RESTORIL ) 7.5 MG capsule Take 1 capsule (7.5 mg total) by mouth at bedtime as needed for sleep. 04/26/23   Eben Reyes BROCKS, MD  Vilazodone  HCl (VIIBRYD ) 20 MG TABS Take 1 tablet (20 mg total) by mouth daily.  11/24/20   Eben Reyes BROCKS, MD  Vitamin D , Ergocalciferol , (DRISDOL ) 1.25 MG (50000 UNIT) CAPS capsule Take 1 capsule (50,000 Units total) by mouth every 7 (seven) days. 05/22/22   Berkeley Adelita PENNER, MD      Allergies    Guaifenesin , Hydrocodone , Hydrocodone -acetaminophen , Lactose intolerance (gi), Latex, and Ancef  [cefazolin ]    Review of Systems   Review of Systems  All other systems reviewed and are negative.   Physical Exam Updated Vital Signs BP 116/80 (BP Location: Left Arm)   Pulse 74   Temp 98.7 F (37.1 C) (Oral)   Resp 18   Ht 1.575 m (5' 2)   Wt 122.5 kg   SpO2 94%   BMI 49.38 kg/m  Physical Exam Vitals and nursing note reviewed.  Constitutional:      General: She is not in acute distress.    Appearance: Normal appearance. She is well-developed. She is not toxic-appearing.  HENT:     Head: Normocephalic and atraumatic.  Eyes:     General: Lids are normal.     Conjunctiva/sclera: Conjunctivae normal.     Pupils: Pupils are equal, round, and reactive to light.  Neck:     Thyroid: No thyroid mass.     Trachea: No tracheal deviation.  Cardiovascular:     Rate and Rhythm: Normal rate and regular rhythm.     Heart sounds: Normal heart sounds. No murmur heard.    No gallop.  Pulmonary:     Effort: Pulmonary effort is normal. No respiratory distress.     Breath sounds: Normal breath sounds. No stridor. No decreased breath sounds, wheezing, rhonchi or rales.  Abdominal:     General: There is no distension.     Palpations: Abdomen is soft.     Tenderness: There is no abdominal tenderness. There is no rebound.  Musculoskeletal:        General: No tenderness. Normal range of motion.     Cervical back: Normal range of motion and neck supple.  Skin:    General: Skin is warm and dry.     Findings: No abrasion or rash.  Neurological:     Mental Status: She is alert and oriented to person, place, and time. Mental status is at baseline.     GCS: GCS eye subscore  is 4. GCS verbal subscore is 5. GCS motor subscore is 6.     Cranial Nerves: No cranial nerve deficit.     Sensory: No sensory deficit.     Motor: Motor function is intact.  Psychiatric:        Attention and Perception: Attention normal.        Speech: Speech normal.        Behavior: Behavior normal.     ED Results / Procedures / Treatments   Labs (all labs ordered are listed, but only abnormal results  are displayed) Labs Reviewed - No data to display  EKG None  Radiology CT Foot Left Wo Contrast Result Date: 05/26/2023 CLINICAL DATA:  Fall from porch, left foot pain and swelling with fractures along the Lisfranc joint shown on conventional radiographs. EXAM: CT OF THE LEFT FOOT WITHOUT CONTRAST TECHNIQUE: Multidetector CT imaging of the left foot was performed according to the standard protocol. Multiplanar CT image reconstructions were also generated. RADIATION DOSE REDUCTION: This exam was performed according to the departmental dose-optimization program which includes automated exposure control, adjustment of the mA and/or kV according to patient size and/or use of iterative reconstruction technique. COMPARISON:  Radiographs 05/26/2023 FINDINGS: Bones/Joint/Cartilage Fragmented lateral sesamoid bone of the first digit, although generally well corticated. Numerous fractures are noted along the Lisfranc joint. This includes a comminuted fracture of the medial portion of the medial cuneiform with multiple fragments along the distal medial articular surface as appreciated on image 73 series 2. These include multiple fragments along the expected attachment site of the Lisfranc ligament. The adjacent first metatarsal is mildly laterally deviated and demonstrates fracture of the medial base extending into the articular surface as well as a subtle avulsion fracture of the lateral base as on image 46 series 4. In the second digit, there is 3 mm lateral deviation of the second metatarsal with respect  to the middle cuneiform, with a dorsal avulsion of the middle cuneiform and a plantar fracture of the base of the second metatarsal. There is also a small fracture fragment in between the bases of the second and third metatarsals. At the third digit we demonstrate dorsal avulsion is of both the base of the third metatarsal and the lateral cuneiform, as well as intermetatarsal fragments between the plantar bases of the third and fourth metatarsals. The fourth digit we demonstrate comminuted fracture of the base of the fourth metatarsal with transverse component and a sagittally oriented component extending into the articular surface. There is likely some additional comminution. At the fifth digit there is a dorsal lateral avulsion of the cuneiform and potentially a tiny plantar-medial avulsion of the base of the fifth metatarsal. Ligaments Suboptimally assessed by CT. Because it only attaches to fragments of the medial cuneiform, the Lisfranc ligament is not felt to be biomechanically intact. Muscles and Tendons Probable distal tibialis posterior tendinopathy. Abnormal thickening of the plantar fascia with subcutaneous edema plantar to the fascia, cannot exclude a plantar fascial injury. Soft tissues Expected subcutaneous edema in the vicinity of the Lisfranc fractures. IMPRESSION: 1. Homolateral Lisfranc joint fracture dislocation with fractures detailed above. 2. Abnormal thickening of the plantar fascia with subcutaneous edema plantar to the fascia, cannot exclude a plantar fascial injury. 3. Probable distal tibialis posterior tendinopathy. Electronically Signed   By: Ryan Salvage M.D.   On: 05/26/2023 17:12   CT Head Wo Contrast Result Date: 05/26/2023 CLINICAL DATA:  Head trauma EXAM: CT HEAD WITHOUT CONTRAST TECHNIQUE: Contiguous axial images were obtained from the base of the skull through the vertex without intravenous contrast. RADIATION DOSE REDUCTION: This exam was performed according to the  departmental dose-optimization program which includes automated exposure control, adjustment of the mA and/or kV according to patient size and/or use of iterative reconstruction technique. COMPARISON:  None Available. FINDINGS: Brain: No acute territorial infarction, hemorrhage or intracranial mass. Cortical volume loss, advanced for age. Nonenlarged ventricles. Vascular: No hyperdense vessels.  No unexpected calcification Skull: Normal. Negative for fracture or focal lesion. Sinuses/Orbits: No acute finding. Other: None IMPRESSION: 1. No CT evidence  for acute intracranial abnormality. 2. Age advanced cortical volume loss/atrophy Electronically Signed   By: Luke Bun M.D.   On: 05/26/2023 17:04    Procedures Procedures    Medications Ordered in ED Medications  morphine  (PF) 4 MG/ML injection 8 mg (has no administration in time range)    ED Course/ Medical Decision Making/ A&P                                 Medical Decision Making Risk Prescription drug management.   Patient here with inability to care for herself at home.  Patient does have a husband at home but he has chronic back issues.  A relative is come to see her in few days.  Patient given 2 Percocet here for her pain.  Foot exam shows no evidence of compartment syndrome.  Patient seen by social work and they are trying to set up charity home health for her.  Had long discussion with patient that due to her insurance challenges that this is the best that we can do.  Was sent home by PT [facility].  Patient is scheduled to see her orthopedist tomorrow for her foot        Final Clinical Impression(s) / ED Diagnoses Final diagnoses:  None    Rx / DC Orders ED Discharge Orders     None         Dasie Faden, MD 05/28/23 1812

## 2023-05-28 NOTE — ED Triage Notes (Signed)
Patient BIB EMS. Patient had a fall 3 days ago and dx with left foot fracture. Patient reports she is unable to care for herself at home.

## 2023-05-28 NOTE — ED Notes (Signed)
[facility] called for pt transport. 

## 2023-05-29 ENCOUNTER — Telehealth: Payer: Self-pay

## 2023-05-29 ENCOUNTER — Telehealth (HOSPITAL_COMMUNITY): Payer: Self-pay | Admitting: Emergency Medicine

## 2023-05-29 ENCOUNTER — Telehealth: Payer: Self-pay | Admitting: *Deleted

## 2023-05-29 NOTE — Telephone Encounter (Signed)
Needs PT order for orthopedic injury

## 2023-05-29 NOTE — Telephone Encounter (Signed)
 RNCM notified by EDSW regarding uninsured discharged pt discharge needs for wheelchair, ramp at home and medical transportation to and from appointments.  RNCM advised that the only way pt would get a wheelchair would be [employer] or if DME company would do it for charity and being that she is not here, that is not likely. Weekend EDSW may have given her resources to have a ramp built in the community as that is not a service provided by [facility]; and for transportation to her appointments, she will have to ask the MD office if they provide that service since she does not have insurance.

## 2023-05-29 NOTE — Telephone Encounter (Signed)
 Notified Cindie with [facility] re: charity HHPT for patient. Orders are on file.  No additional TOC needs at this time.

## 2023-05-29 NOTE — Telephone Encounter (Signed)
 Patient had come in with chief complaint of fall.  She was found to have an orthopedic injury.  I have been requested to put in home health, PT order.

## 2023-05-29 NOTE — Telephone Encounter (Signed)
 Needs PT order for orthopedic injury

## 2023-06-03 ENCOUNTER — Other Ambulatory Visit: Payer: Self-pay | Admitting: Infectious Diseases

## 2023-06-03 ENCOUNTER — Telehealth: Payer: Self-pay

## 2023-06-03 ENCOUNTER — Other Ambulatory Visit: Payer: Self-pay | Admitting: Orthopaedic Surgery

## 2023-06-03 DIAGNOSIS — E1165 Type 2 diabetes mellitus with hyperglycemia: Secondary | ICD-10-CM

## 2023-06-03 MED ORDER — SEMAGLUTIDE(0.25 OR 0.5MG/DOS) 2 MG/3ML ~~LOC~~ SOPN: 2.0000 mg | PEN_INJECTOR | SUBCUTANEOUS | 3 refills | Status: DC

## 2023-06-03 NOTE — Telephone Encounter (Signed)
 Prior Authorization for patient (Ozempic  (0.25 or 0.5 MG/DOSE) 2MG /3ML pen-injectors) came through on cover my meds was submitted with last office notes awaiting approval or denial.  ZOX:[id number]

## 2023-06-03 NOTE — Telephone Encounter (Signed)
 Decision:Approved  Lindsay French (Key: [id number]) Rx #: [phone] Ozempic  (0.25 or 0.5 MG/DOSE) 2MG [name] pen-injectors Form [employer] [place]  Electronic Prior Authorization Request Form (2017 NCPDP) Created Message from Plan Approved. This drug has been approved. Approved quantity: 3 units per 28 day(s). The drug has been approved from 05/20/2023 to 06/02/2024. Please call the pharmacy to process your prescription claim. Generic or biosimilar substitution may be required when available and preferred on the formulary.. Authorization Expiration Date: June 02, 2024.

## 2023-06-04 ENCOUNTER — Other Ambulatory Visit: Payer: Self-pay | Admitting: Infectious Diseases

## 2023-06-04 DIAGNOSIS — E1165 Type 2 diabetes mellitus with hyperglycemia: Secondary | ICD-10-CM

## 2023-06-04 MED ORDER — SEMAGLUTIDE(0.25 OR 0.5MG/DOS) 2 MG/3ML ~~LOC~~ SOPN: 2.0000 mg | PEN_INJECTOR | SUBCUTANEOUS | 3 refills | Status: DC

## 2023-06-06 ENCOUNTER — Encounter: Payer: Self-pay | Admitting: Infectious Diseases

## 2023-06-06 NOTE — Progress Notes (Signed)
COVID Vaccine Completed:  Yes  Date of COVID positive in last 90 days:  PCP - Johny Sax, MD Cardiologist -   Chest x-ray -  EKG -  Stress Test -  ECHO -  Cardiac Cath -  Pacemaker/ICD device last checked: Spinal Cord Stimulator:  Bowel Prep -   Sleep Study -  CPAP -   Fasting Blood Sugar -  Checks Blood Sugar _____ times a day  Ozempic Last dose of GLP1 agonist-  N/A GLP1 instructions:  Hold 7 days before surgery    Last dose of SGLT-2 inhibitors-  N/A SGLT-2 instructions:  Hold 3 days before surgery    Blood Thinner Instructions:  Last dose:   Time: Aspirin Instructions: Last Dose:  Activity level:  Can go up a flight of stairs and perform activities of daily living without stopping and without symptoms of chest pain or shortness of breath.  Able to exercise without symptoms  Unable to go up a flight of stairs without symptoms of     Anesthesia review:   Patient denies shortness of breath, fever, cough and chest pain at PAT appointment  Patient verbalized understanding of instructions that were given to them at the PAT appointment. Patient was also instructed that they will need to review over the PAT instructions again at home before surgery.

## 2023-06-06 NOTE — Patient Instructions (Addendum)
SURGICAL WAITING ROOM VISITATION Patients having surgery or a procedure may have no more than 2 support people in the waiting area - these visitors may rotate.    Children under the age of 61 must have an adult with them who is not the patient.  Due to an increase in RSV and influenza rates and associated hospitalizations, children ages 78 and under may not visit patients in [facility] hospitals.   If the patient needs to stay at the hospital during part of their recovery, the visitor guidelines for inpatient rooms apply. Pre-op nurse will coordinate an appropriate time for 1 support person to accompany patient in pre-op.  This support person may not rotate.    Please refer to the [facility] website for the visitor guidelines for Inpatients (after your surgery is over and you are in a regular room).       Your procedure is scheduled on: 06-11-23   Report to [facility] Main Entrance    Report to admitting at 9:00 AM   Call this number if you have problems the morning of surgery [phone]   Do not eat food :After Midnight.   After Midnight you may have the following liquids until 8:15 AM DAY OF SURGERY  Water Non-Citrus Juices (without pulp, NO RED-Apple, White grape, White cranberry) Black Coffee (NO MILK/CREAM OR CREAMERS, sugar ok)  Clear Tea (NO MILK/CREAM OR CREAMERS, sugar ok) regular and decaf                             Plain Jell-O (NO RED)                                           Fruit ices (not with fruit pulp, NO RED)                                     Popsicles (NO RED)                                                               Sports drinks like Gatorade (NO RED)                   The day of surgery:  Drink ONE (1) Pre-Surgery G2 by 8:15 AM the morning of surgery. Drink in one sitting. Do not sip.  This drink was given to you during your hospital  pre-op appointment visit. Nothing else to drink after completing the Pre-Surgery  G2.           If you have questions, please contact your surgeon's office.   FOLLOW  ANY ADDITIONAL PRE OP INSTRUCTIONS YOU RECEIVED FROM YOUR SURGEON'S OFFICE!!!     Oral Hygiene is also important to reduce your risk of infection.                                    Remember - BRUSH YOUR TEETH THE MORNING OF SURGERY WITH YOUR REGULAR TOOTHPASTE  Do NOT smoke after Midnight   Take these medicines the morning of surgery with A SIP OF WATER:    [name]   Gabapentin   Vilazodone   Okay to use inhalers   If needed Tylenol, Alprazolam, Oxycodone  Stop all vitamins and herbal supplements 7 days before surgery  How to Manage Your Diabetes Before and After Surgery  Why is it important to control my blood sugar before and after surgery? Improving blood sugar levels before and after surgery helps healing and can limit problems. A way of improving blood sugar control is eating a healthy diet by:  Eating less sugar and carbohydrates  Increasing activity/exercise  Talking with your doctor about reaching your blood sugar goals High blood sugars (greater than 180 mg/dL) can raise your risk of infections and slow your recovery, so you will need to focus on controlling your diabetes during the weeks before surgery. Make sure that the doctor who takes care of your diabetes knows about your planned surgery including the date and location.  How do I manage my blood sugar before surgery? Check your blood sugar at least 4 times a day, starting 2 days before surgery, to make sure that the level is not too high or low. Check your blood sugar the morning of your surgery when you wake up and every 2 hours until you get to the Short Stay unit. If your blood sugar is less than 70 mg/dL, you will need to treat for low blood sugar: Do not take insulin. Treat a low blood sugar (less than 70 mg/dL) with  cup of clear juice (cranberry or apple), 4 glucose tablets, OR glucose gel. Recheck blood sugar in 15 minutes after  treatment (to make sure it is greater than 70 mg/dL). If your blood sugar is not greater than 70 mg/dL on recheck, call [phone] for further instructions. Report your blood sugar to the short stay nurse when you get to Short Stay.  If you are admitted to the hospital after surgery: Your blood sugar will be checked by the staff and you will probably be given insulin after surgery (instead of oral diabetes medicines) to make sure you have good blood sugar levels. The goal for blood sugar control after surgery is 80-180 mg/dL.   WHAT DO I DO ABOUT MY DIABETES MEDICATION?  Hold Semaglutide 7 days before surgery .       DO NOT TAKE THE FOLLOWING 7 DAYS PRIOR TO SURGERY: Ozempic, Wegovy, Rybelsus (Semaglutide), Byetta (exenatide), Bydureon (exenatide ER), Victoza, Saxenda (liraglutide), or Trulicity (dulaglutide) Mounjaro (Tirzepatide) Adlyxin (Lixisenatide), Polyethylene Glycol Loxenatide.  Reviewed and Endorsed by [facility] Patient Education Committee, August 2015  Bring CPAP mask and tubing day of surgery.                              You may not have any metal on your body including hair pins, jewelry, and body piercing             Do not wear make-up, lotions, powders, perfumes or deodorant  Do not wear nail polish including gel and S&S, artificial/acrylic nails, or any other type of covering on natural nails including finger and toenails. If you have artificial nails, gel coating, etc. that needs to be removed by a nail salon please have this removed prior to surgery or surgery may need to be canceled/ delayed if the surgeon/ anesthesia feels like they are unable to be safely  monitored.   Do not shave  48 hours prior to surgery.    Do not bring valuables to the hospital. [facility] IS NOT RESPONSIBLE   FOR VALUABLES.   Contacts, dentures or bridgework may not be worn into surgery.   Bring small overnight bag day of surgery.   DO NOT BRING YOUR HOME MEDICATIONS TO THE  HOSPITAL. PHARMACY WILL DISPENSE MEDICATIONS LISTED ON YOUR MEDICATION LIST TO YOU DURING YOUR ADMISSION IN THE HOSPITAL!    Special Instructions: Bring a copy of your healthcare power of attorney and living will documents the day of surgery if you haven't scanned them before.              Please read over the following fact sheets you were given: IF YOU HAVE QUESTIONS ABOUT YOUR PRE-OP INSTRUCTIONS PLEASE CALL [phone] [place]  If you received a COVID test during your pre-op visit  it is requested that you wear a mask when out in public, stay away from anyone that may not be feeling well and notify your surgeon if you develop symptoms. If you test positive for Covid or have been in contact with anyone that has tested positive in the last 10 days please notify you surgeon.  [facility] - Preparing for Surgery Before surgery, you can play an important role.  Because skin is not sterile, your skin needs to be as free of germs as possible.  You can reduce the number of germs on your skin by washing with CHG (chlorahexidine gluconate) soap before surgery.  CHG is an antiseptic cleaner which kills germs and bonds with the skin to continue killing germs even after washing. Please DO NOT use if you have an allergy to CHG or antibacterial soaps.  If your skin becomes reddened/irritated stop using the CHG and inform your nurse when you arrive at Short Stay. Do not shave (including legs and underarms) for at least 48 hours prior to the first CHG shower.  You may shave your face/neck.  Please follow these instructions carefully:  1.  Shower with CHG Soap the night before surgery and the  morning of surgery.  2.  If you choose to wash your hair, wash your hair first as usual with your normal  shampoo.  3.  After you shampoo, rinse your hair and body thoroughly to remove the shampoo.                             4.  Use CHG as you would any other liquid soap.  You can apply chg directly to the skin and wash.   Gently with a scrungie or clean washcloth.  5.  Apply the CHG Soap to your body ONLY FROM THE NECK DOWN.   Do   not use on face/ open                           Wound or open sores. Avoid contact with eyes, ears mouth and   genitals (private parts).                       Wash face,  Genitals (private parts) with your normal soap.             6.  Wash thoroughly, paying special attention to the area where your    surgery  will be performed.  7.  Thoroughly rinse your body  with warm water from the neck down.  8.  DO NOT shower/wash with your normal soap after using and rinsing off the CHG Soap.                9.  Pat yourself dry with a clean towel.            10.  Wear clean pajamas.            11.  Place clean sheets on your bed the night of your first shower and do not  sleep with pets. Day of Surgery : Do not apply any lotions/deodorants the morning of surgery.  Please wear clean clothes to the hospital/surgery center.  FAILURE TO FOLLOW THESE INSTRUCTIONS MAY RESULT IN THE CANCELLATION OF YOUR SURGERY  PATIENT SIGNATURE_________________________________  NURSE SIGNATURE__________________________________  ________________________________________________________________________

## 2023-06-07 ENCOUNTER — Other Ambulatory Visit: Payer: Self-pay

## 2023-06-07 ENCOUNTER — Encounter (HOSPITAL_COMMUNITY)
Admission: RE | Admit: 2023-06-07 | Discharge: 2023-06-07 | Disposition: A | Discharge status: Home / Self Care | Payer: Medicaid Other | Source: Ambulatory Visit | Attending: Orthopaedic Surgery | Admitting: Orthopaedic Surgery

## 2023-06-07 ENCOUNTER — Encounter (HOSPITAL_COMMUNITY): Payer: Self-pay

## 2023-06-07 VITALS — Ht 62.0 in | Wt 270.0 lb

## 2023-06-07 DIAGNOSIS — Z01818 Encounter for other preprocedural examination: Secondary | ICD-10-CM

## 2023-06-07 DIAGNOSIS — E119 Type 2 diabetes mellitus without complications: Secondary | ICD-10-CM

## 2023-06-07 DIAGNOSIS — I10 Essential (primary) hypertension: Secondary | ICD-10-CM

## 2023-06-07 HISTORY — DX: Type 2 diabetes mellitus without complications: E11.9

## 2023-06-10 ENCOUNTER — Encounter (HOSPITAL_COMMUNITY): Admission: RE | Admit: 2023-06-10 | Payer: Medicaid Other | Source: Ambulatory Visit

## 2023-06-10 NOTE — Progress Notes (Signed)
Patient aware of new location and time of surgery.  Patient reports no changes since she spoke to the nurse at [facility].

## 2023-06-11 ENCOUNTER — Ambulatory Visit (HOSPITAL_COMMUNITY): Payer: Self-pay | Admitting: Physician Assistant

## 2023-06-11 ENCOUNTER — Other Ambulatory Visit: Payer: Self-pay

## 2023-06-11 ENCOUNTER — Ambulatory Visit (HOSPITAL_BASED_OUTPATIENT_CLINIC_OR_DEPARTMENT_OTHER): Payer: Medicaid Other | Admitting: Physician Assistant

## 2023-06-11 ENCOUNTER — Ambulatory Visit (HOSPITAL_COMMUNITY): Payer: Medicaid Other

## 2023-06-11 ENCOUNTER — Ambulatory Visit (HOSPITAL_COMMUNITY)
Admission: RE | Admit: 2023-06-11 | Discharge: 2023-06-11 | Disposition: A | Discharge status: Home / Self Care | Payer: Medicaid Other | Attending: Orthopaedic Surgery | Admitting: Orthopaedic Surgery

## 2023-06-11 ENCOUNTER — Encounter (HOSPITAL_COMMUNITY): Payer: Self-pay | Admitting: Orthopaedic Surgery

## 2023-06-11 ENCOUNTER — Encounter (HOSPITAL_COMMUNITY)
Admission: RE | Disposition: A | Discharge status: Home / Self Care | Payer: Self-pay | Source: Home / Self Care | Attending: Orthopaedic Surgery

## 2023-06-11 DIAGNOSIS — D649 Anemia, unspecified: Secondary | ICD-10-CM | POA: Insufficient documentation

## 2023-06-11 DIAGNOSIS — F419 Anxiety disorder, unspecified: Secondary | ICD-10-CM | POA: Insufficient documentation

## 2023-06-11 DIAGNOSIS — Z7985 Long-term (current) use of injectable non-insulin antidiabetic drugs: Secondary | ICD-10-CM | POA: Insufficient documentation

## 2023-06-11 DIAGNOSIS — I10 Essential (primary) hypertension: Secondary | ICD-10-CM

## 2023-06-11 DIAGNOSIS — Z87891 Personal history of nicotine dependence: Secondary | ICD-10-CM

## 2023-06-11 DIAGNOSIS — F32A Depression, unspecified: Secondary | ICD-10-CM | POA: Diagnosis not present

## 2023-06-11 DIAGNOSIS — S92312A Displaced fracture of first metatarsal bone, left foot, initial encounter for closed fracture: Secondary | ICD-10-CM | POA: Insufficient documentation

## 2023-06-11 DIAGNOSIS — S92242A Displaced fracture of medial cuneiform of left foot, initial encounter for closed fracture: Secondary | ICD-10-CM | POA: Insufficient documentation

## 2023-06-11 DIAGNOSIS — S92212A Displaced fracture of cuboid bone of left foot, initial encounter for closed fracture: Secondary | ICD-10-CM | POA: Diagnosis not present

## 2023-06-11 DIAGNOSIS — Z21 Asymptomatic human immunodeficiency virus [HIV] infection status: Secondary | ICD-10-CM | POA: Insufficient documentation

## 2023-06-11 DIAGNOSIS — S92902A Unspecified fracture of left foot, initial encounter for closed fracture: Secondary | ICD-10-CM | POA: Diagnosis not present

## 2023-06-11 DIAGNOSIS — S92342A Displaced fracture of fourth metatarsal bone, left foot, initial encounter for closed fracture: Secondary | ICD-10-CM | POA: Diagnosis not present

## 2023-06-11 DIAGNOSIS — S92322A Displaced fracture of second metatarsal bone, left foot, initial encounter for closed fracture: Secondary | ICD-10-CM | POA: Insufficient documentation

## 2023-06-11 DIAGNOSIS — E119 Type 2 diabetes mellitus without complications: Secondary | ICD-10-CM | POA: Diagnosis not present

## 2023-06-11 DIAGNOSIS — X58XXXA Exposure to other specified factors, initial encounter: Secondary | ICD-10-CM | POA: Diagnosis not present

## 2023-06-11 DIAGNOSIS — J45909 Unspecified asthma, uncomplicated: Secondary | ICD-10-CM | POA: Diagnosis not present

## 2023-06-11 DIAGNOSIS — Z7951 Long term (current) use of inhaled steroids: Secondary | ICD-10-CM | POA: Insufficient documentation

## 2023-06-11 DIAGNOSIS — Z01818 Encounter for other preprocedural examination: Secondary | ICD-10-CM

## 2023-06-11 HISTORY — PX: OPEN REDUCTION INTERNAL FIXATION (ORIF) FOOT LISFRANC FRACTURE: SHX5990

## 2023-06-11 LAB — GLUCOSE, CAPILLARY
Glucose-Capillary: 109 mg/dL — ABNORMAL HIGH (ref 70–99)
Glucose-Capillary: 93 mg/dL (ref 70–99)
Glucose-Capillary: 126 mg/dL — ABNORMAL HIGH (ref 70–99)

## 2023-06-11 LAB — CBC
HCT: 19.1 % — ABNORMAL LOW (ref 36.0–46.0)
Hemoglobin: 5.8 g/dL — CL (ref 12.0–15.0)
MCH: 31 pg (ref 26.0–34.0)
MCHC: 30.4 g/dL (ref 30.0–36.0)
MCV: 102.1 fL — ABNORMAL HIGH (ref 80.0–100.0)
Platelets: 465 10*3/uL — ABNORMAL HIGH (ref 150–400)
RBC: 1.87 MIL/uL — ABNORMAL LOW (ref 3.87–5.11)
RDW: 19.9 % — ABNORMAL HIGH (ref 11.5–15.5)
WBC: 7.3 10*3/uL (ref 4.0–10.5)
nRBC: 0.7 % — ABNORMAL HIGH (ref 0.0–0.2)

## 2023-06-11 LAB — BASIC METABOLIC PANEL
Anion gap: 12 (ref 5–15)
BUN: 11 mg/dL (ref 6–20)
CO2: 25 mmol/L (ref 22–32)
Calcium: 9.3 mg/dL (ref 8.9–10.3)
Chloride: 102 mmol/L (ref 98–111)
Creatinine, Ser: 0.61 mg/dL (ref 0.44–1.00)
GFR, Estimated: >60 mL/min (ref >60)
Glucose, Bld: 102 mg/dL — ABNORMAL HIGH (ref 70–99)
Potassium: 3.6 mmol/L (ref 3.5–5.1)
Sodium: 139 mmol/L (ref 135–145)

## 2023-06-11 LAB — PREPARE RBC (CROSSMATCH)

## 2023-06-11 LAB — HCG, SERUM, QUALITATIVE: Preg, Serum: NEGATIVE

## 2023-06-11 SURGERY — OPEN REDUCTION INTERNAL FIXATION (ORIF) FOOT LISFRANC FRACTURE
Anesthesia: General | Site: Foot | Laterality: Left

## 2023-06-11 MED ORDER — BUPIVACAINE HCL (PF) 0.25 % IJ SOLN
INTRAMUSCULAR | Status: AC
  Filled 2023-06-11: qty 60

## 2023-06-11 MED ORDER — PROPOFOL 10 MG/ML IV BOLUS
INTRAVENOUS | Status: DC | PRN
  Administered 2023-06-11: 250 mg via INTRAVENOUS

## 2023-06-11 MED ORDER — VANCOMYCIN HCL 1500 MG/300ML IV SOLN
1500.0000 mg | INTRAVENOUS | Status: AC
  Administered 2023-06-11: 1500 mg via INTRAVENOUS
  Filled 2023-06-11: qty 300

## 2023-06-11 MED ORDER — CHLORHEXIDINE GLUCONATE 0.12 % MT SOLN
15.0000 mL | Freq: Once | OROMUCOSAL | Status: AC
  Administered 2023-06-11: 15 mL via OROMUCOSAL
  Filled 2023-06-11: qty 15

## 2023-06-11 MED ORDER — MIDAZOLAM HCL 2 MG/2ML IJ SOLN
INTRAMUSCULAR | Status: DC | PRN
  Administered 2023-06-11: 2 mg via INTRAVENOUS

## 2023-06-11 MED ORDER — MIDAZOLAM HCL 2 MG/2ML IJ SOLN
INTRAMUSCULAR | Status: AC
  Filled 2023-06-11: qty 2

## 2023-06-11 MED ORDER — FENTANYL CITRATE (PF) 250 MCG/5ML IJ SOLN
INTRAMUSCULAR | Status: AC
  Filled 2023-06-11: qty 5

## 2023-06-11 MED ORDER — OXYCODONE HCL 5 MG PO TABS
ORAL_TABLET | ORAL | Status: AC
  Filled 2023-06-11: qty 1

## 2023-06-11 MED ORDER — POVIDONE-IODINE 10 % EX SWAB
2.0000 | Freq: Once | CUTANEOUS | Status: AC
  Administered 2023-06-11: 2 via TOPICAL

## 2023-06-11 MED ORDER — LIDOCAINE 2% (20 MG/ML) 5 ML SYRINGE
INTRAMUSCULAR | Status: DC | PRN
  Administered 2023-06-11: 60 mg via INTRAVENOUS

## 2023-06-11 MED ORDER — LACTATED RINGERS IV SOLN
INTRAVENOUS | Status: DC | PRN
Start: 2023-06-11 — End: 2023-06-11

## 2023-06-11 MED ORDER — OXYCODONE HCL 5 MG/5ML PO SOLN: 5.0000 mg | Freq: Once | ORAL | Status: AC | PRN

## 2023-06-11 MED ORDER — SODIUM CHLORIDE 0.9% IV SOLUTION: Freq: Once | INTRAVENOUS | Status: DC

## 2023-06-11 MED ORDER — FENTANYL CITRATE (PF) 100 MCG/2ML IJ SOLN
INTRAMUSCULAR | Status: AC
  Filled 2023-06-11: qty 2

## 2023-06-11 MED ORDER — OXYCODONE HCL 5 MG PO TABS: 5.0000 mg | ORAL_TABLET | ORAL | 0 refills | Status: DC | PRN

## 2023-06-11 MED ORDER — OXYCODONE HCL 5 MG PO TABS
5.0000 mg | ORAL_TABLET | Freq: Once | ORAL | Status: AC | PRN
  Administered 2023-06-11: 5 mg via ORAL

## 2023-06-11 MED ORDER — ACETAMINOPHEN 10 MG/ML IV SOLN: 1000.0000 mg | Freq: Once | INTRAVENOUS | Status: DC | PRN

## 2023-06-11 MED ORDER — ORAL CARE MOUTH RINSE: 15.0000 mL | Freq: Once | OROMUCOSAL | Status: AC

## 2023-06-11 MED ORDER — ASPIRIN 325 MG PO TABS: ORAL_TABLET | ORAL | 0 refills | Status: DC

## 2023-06-11 MED ORDER — AMISULPRIDE (ANTIEMETIC) 5 MG/2ML IV SOLN: 10.0000 mg | Freq: Once | INTRAVENOUS | Status: DC | PRN

## 2023-06-11 MED ORDER — PHENYLEPHRINE 80 MCG/ML (10ML) SYRINGE FOR IV PUSH (FOR BLOOD PRESSURE SUPPORT)
PREFILLED_SYRINGE | INTRAVENOUS | Status: DC | PRN
  Administered 2023-06-11: 160 ug via INTRAVENOUS

## 2023-06-11 MED ORDER — 0.9 % SODIUM CHLORIDE (POUR BTL) OPTIME
TOPICAL | Status: DC | PRN
  Administered 2023-06-11: 1000 mL

## 2023-06-11 MED ORDER — BUPIVACAINE HCL 0.25 % IJ SOLN
INTRAMUSCULAR | Status: DC | PRN
  Administered 2023-06-11: 25 mL

## 2023-06-11 MED ORDER — FENTANYL CITRATE (PF) 250 MCG/5ML IJ SOLN
INTRAMUSCULAR | Status: DC | PRN
  Administered 2023-06-11: 50 ug via INTRAVENOUS
  Administered 2023-06-11 (×2): 25 ug via INTRAVENOUS
  Administered 2023-06-11: 50 ug via INTRAVENOUS

## 2023-06-11 MED ORDER — LEVOFLOXACIN IN D5W 500 MG/100ML IV SOLN
500.0000 mg | INTRAVENOUS | Status: AC
  Administered 2023-06-11: 500 mg via INTRAVENOUS
  Filled 2023-06-11: qty 100

## 2023-06-11 MED ORDER — FENTANYL CITRATE (PF) 100 MCG/2ML IJ SOLN
25.0000 ug | INTRAMUSCULAR | Status: DC | PRN
  Administered 2023-06-11 (×2): 25 ug via INTRAVENOUS
  Administered 2023-06-11: 50 ug via INTRAVENOUS
  Administered 2023-06-11 (×2): 25 ug via INTRAVENOUS

## 2023-06-11 SURGICAL SUPPLY — 59 items
ALCOHOL 70% 16 OZ (MISCELLANEOUS) ×1 IMPLANT
BAG COUNTER SPONGE SURGICOUNT (BAG) ×1 IMPLANT
BANDAGE ESMARK 6X9 LF (GAUZE/BANDAGES/DRESSINGS) IMPLANT
BIT DRILL 2.5 STRGHT CANN (BIT) IMPLANT
BIT DRILL CANN F/COMP 2.2 (BIT) IMPLANT
BLADE SURG 15 STRL LF DISP TIS (BLADE) ×1 IMPLANT
BNDG COHESIVE 4X5 TAN STRL (GAUZE/BANDAGES/DRESSINGS) IMPLANT
BNDG COHESIVE 6X5 TAN ST LF (GAUZE/BANDAGES/DRESSINGS) IMPLANT
BNDG ELASTIC 4INX 5YD STR LF (GAUZE/BANDAGES/DRESSINGS) IMPLANT
BNDG ELASTIC 6X10 VLCR STRL LF (GAUZE/BANDAGES/DRESSINGS) ×1 IMPLANT
BNDG ESMARK 6X9 LF (GAUZE/BANDAGES/DRESSINGS)
CANISTER SUCT 3000ML PPV (MISCELLANEOUS) ×1 IMPLANT
CHLORAPREP W/TINT 26 (MISCELLANEOUS) ×2 IMPLANT
COVER SURGICAL LIGHT HANDLE (MISCELLANEOUS) ×1 IMPLANT
CUFF TOURN SGL QUICK 42 (TOURNIQUET CUFF) IMPLANT
CUFF TRNQT CYL 34X4.125X (TOURNIQUET CUFF) ×1 IMPLANT
DRAPE OEC MINIVIEW 54X84 (DRAPES) ×1 IMPLANT
DRAPE U-SHAPE 47X51 STRL (DRAPES) ×1 IMPLANT
DRSG MEPITEL 4X7.2 (GAUZE/BANDAGES/DRESSINGS) ×1 IMPLANT
DRSG XEROFORM 1X8 (GAUZE/BANDAGES/DRESSINGS) ×1 IMPLANT
ELECT REM PT RETURN 9FT ADLT (ELECTROSURGICAL) ×1
ELECTRODE REM PT RTRN 9FT ADLT (ELECTROSURGICAL) ×1 IMPLANT
GAUZE PAD ABD 8X10 STRL (GAUZE/BANDAGES/DRESSINGS) ×2 IMPLANT
GAUZE SPONGE 4X4 12PLY STRL (GAUZE/BANDAGES/DRESSINGS) IMPLANT
GAUZE SPONGE 4X4 12PLY STRL LF (GAUZE/BANDAGES/DRESSINGS) ×1 IMPLANT
GAUZE XEROFORM 1X8 LF (GAUZE/BANDAGES/DRESSINGS) IMPLANT
GLOVE BIOGEL M STRL SZ7.5 (GLOVE) ×1 IMPLANT
GLOVE BIOGEL PI IND STRL 6.5 (GLOVE) IMPLANT
GLOVE BIOGEL PI IND STRL 7.5 (GLOVE) IMPLANT
GLOVE BIOGEL PI IND STRL 8 (GLOVE) ×1 IMPLANT
GLOVE SRG 8 PF TXTR STRL LF DI (GLOVE) ×1 IMPLANT
GLOVE SURG ENC TEXT LTX SZ7.5 (GLOVE) ×1 IMPLANT
GOWN STRL REUS W/ TWL LRG LVL3 (GOWN DISPOSABLE) ×1 IMPLANT
GOWN STRL REUS W/ TWL XL LVL3 (GOWN DISPOSABLE) ×2 IMPLANT
K-WIRE TROCAR 1.35 (MISCELLANEOUS) ×5
KIT BASIN OR (CUSTOM PROCEDURE TRAY) ×1 IMPLANT
KIT TURNOVER KIT B (KITS) ×1 IMPLANT
KWIRE TROCAR 1.35 (MISCELLANEOUS) IMPLANT
LOOP VESSEL MAXI 1X406 RED (MISCELLANEOUS) IMPLANT
NS IRRIG 1000ML POUR BTL (IV SOLUTION) ×1 IMPLANT
PACK ORTHO EXTREMITY (CUSTOM PROCEDURE TRAY) ×1 IMPLANT
PAD ARMBOARD 7.5X6 YLW CONV (MISCELLANEOUS) ×2 IMPLANT
PAD CAST 4YDX4 CTTN HI CHSV (CAST SUPPLIES) ×1 IMPLANT
PADDING CAST ABS COTTON 4X4 ST (CAST SUPPLIES) IMPLANT
PLATE LP STR 3.0MM 4H (Plate) IMPLANT
SCREW LOCK COMP 3X16 (Screw) IMPLANT
SCREW LP CORT 3.0X28MM (Screw) IMPLANT
SCREW LP TIT 3.5X36 (Screw) IMPLANT
SCREW VAL KREULOCK 3.0 X 24 (Screw) IMPLANT
SCREW VAL KREULOCK 3X22 (Screw) IMPLANT
SPLINT PLASTER CAST FAST 5X30 (CAST SUPPLIES) IMPLANT
SPONGE T-LAP 18X18 ~~LOC~~+RFID (SPONGE) ×1 IMPLANT
SUCTION TUBE FRAZIER 10FR DISP (SUCTIONS) ×1 IMPLANT
SUT ETHILON 3 0 PS 1 (SUTURE) ×1 IMPLANT
SUT MNCRL AB 3-0 PS2 27 (SUTURE) ×1 IMPLANT
SUT VIC AB 2-0 CT1 TAPERPNT 27 (SUTURE) ×2 IMPLANT
TOWEL GREEN STERILE (TOWEL DISPOSABLE) ×1 IMPLANT
TOWEL GREEN STERILE FF (TOWEL DISPOSABLE) ×1 IMPLANT
TUBE CONNECTING 12X1/4 (SUCTIONS) ×1 IMPLANT

## 2023-06-11 NOTE — Brief Op Note (Signed)
06/11/2023  3:29 PM  PATIENT:  Lindsay French  43 y.o. female  PRE-OPERATIVE DIAGNOSIS:  LEFT FOOT MIDFOOT FRACTURE DISLOCATION WITH LISFRANC DISRUPTION  POST-OPERATIVE DIAGNOSIS:  LEFT FOOT MIDFOOT FRACTURE DISLOCATION WITH LISFRANC DISRUPTION  PROCEDURE:  Procedure(s): OPEN TREAMENT OF LEFT FIRST, SECOND, FOURTH METATARSALS, MEDIAL CUNEIFORM FRACTURE, CUBOID, FIRST SECOND THRID TARSOMETATARSAL AND LISFRANC JOINT (Left)  SURGEON:  Surgeons and Role:    * Terance Hart, MD - Primary  PHYSICIAN ASSISTANT:  Jesse [place], PA-C  ASSISTANTS: PA-S   ANESTHESIA:   general  EBL:  20 mL   BLOOD ADMINISTERED: 1 unit CC PRBC  DRAINS: none   LOCAL MEDICATIONS USED:  MARCAINE     SPECIMEN:  No Specimen  DISPOSITION OF SPECIMEN:  N/A  COUNTS:  YES  TOURNIQUET:   Total Tourniquet Time Documented: Thigh (Left) - 72 minutes Total: Thigh (Left) - 72 minutes   DICTATION: .Reubin Milan Dictation  PLAN OF CARE: Discharge to home after PACU  PATIENT DISPOSITION:  PACU - hemodynamically stable.   Delay start of Pharmacological VTE agent (>24hrs) due to surgical blood loss or risk of bleeding: yes

## 2023-06-11 NOTE — Progress Notes (Signed)
CRITICAL VALUE STICKER  CRITICAL VALUE: Hgb 5.8  RECEIVER (on-site recipient of call):Sarah Block  DATE & TIME NOTIFIED: 06/11/23 at 11:14  MESSENGER (representative from lab): Steffanie Rainwater  MD NOTIFIED: Dr. Bradley Ferris  TIME OF NOTIFICATION: 11:14  RESPONSE:  Type and Screen Ordered

## 2023-06-11 NOTE — Progress Notes (Signed)
Dr. Bradley Ferris made aware of hemoglobin 5.8. New order received for type and screen. Lab collected at this time.

## 2023-06-11 NOTE — Anesthesia Postprocedure Evaluation (Signed)
Anesthesia Post Note  Patient: Arien Morine  Procedure(s) Performed: OPEN TREAMENT OF LEFT FIRST, SECOND, FOURTH METATARSALS, MEDIAL CUNEIFORM FRACTURE, CUBOID, FIRST SECOND THRID TARSOMETATARSAL AND LISFRANC JOINT (Left: Foot)     Patient location during evaluation: PACU Anesthesia Type: General Level of consciousness: awake Pain management: pain level controlled Vital Signs Assessment: post-procedure vital signs reviewed and stable Respiratory status: spontaneous breathing, nonlabored ventilation and respiratory function stable Cardiovascular status: blood pressure returned to baseline and stable Postop Assessment: no apparent nausea or vomiting Anesthetic complications: no   No notable events documented.  Last Vitals:  Vitals:   06/11/23 1645 06/11/23 1700  BP: (!) 152/95 (!) 147/90  Pulse: (!) 103 (!) 102  Resp: 10 13  Temp:  36.6 C  SpO2: 95% 95%    Last Pain:  Vitals:   06/11/23 1320  TempSrc: Oral  PainSc:                  Quenten Nawaz P Pammy Vesey

## 2023-06-11 NOTE — Anesthesia Procedure Notes (Signed)
Procedure Name: LMA Insertion Date/Time: 06/11/2023 1:54 PM  Performed by: Loleta Ayeza Therriault, CRNAPre-anesthesia Checklist: Patient identified, Patient being monitored, Timeout performed, Emergency Drugs available and Suction available Patient Re-evaluated:Patient Re-evaluated prior to induction Oxygen Delivery Method: Circle system utilized Preoxygenation: Pre-oxygenation with 100% oxygen Induction Type: IV induction Ventilation: Mask ventilation without difficulty LMA: LMA inserted LMA Size: 4.0 Tube type: Oral Number of attempts: 1 Placement Confirmation: positive ETCO2 and breath sounds checked- equal and bilateral Tube secured with: Tape Dental Injury: Teeth and Oropharynx as per pre-operative assessment

## 2023-06-11 NOTE — Transfer of Care (Signed)
Immediate Anesthesia Transfer of Care Note  Patient: Lindsay French  Procedure(s) Performed: OPEN TREAMENT OF LEFT FIRST, SECOND, FOURTH METATARSALS, MEDIAL CUNEIFORM FRACTURE, CUBOID, FIRST SECOND THRID TARSOMETATARSAL AND LISFRANC JOINT (Left: Foot)  Patient Location: PACU  Anesthesia Type:General  Level of Consciousness: awake  Airway & Oxygen Therapy: Patient Spontanous Breathing  Post-op Assessment: Report given to RN and Post -op Vital signs reviewed and stable  Post vital signs: Reviewed and stable  Last Vitals:  Vitals Value Taken Time  BP 145/85 06/11/23 1545  Temp    Pulse 105 06/11/23 1547  Resp 9 06/11/23 1547  SpO2 93 % 06/11/23 1547  Vitals shown include unfiled device data.  Last Pain:  Vitals:   06/11/23 1320  TempSrc: Oral  PainSc:       Patients Stated Pain Goal: 2 (06/11/23 1023)  Complications: No notable events documented.

## 2023-06-11 NOTE — H&P (Signed)
PREOPERATIVE H&P  Chief Complaint: Left foot pain  HPI: Lindsay French is a 43 y.o. female who presents for preoperative history and physical with a diagnosis of left midfoot fracture dislocations. She is here for surgery. Symptoms are rated as moderate to severe, and have been worsening.  This is significantly impairing activities of daily living.  She has elected for surgical management.   Past Medical History:  Diagnosis Date   ADHD    Anxiety    Asthma    usually with colds   Avascular necrosis (HCC)    Back pain    Depression    Diabetes mellitus without complication (HCC)    Elevated LFTs    Fatty liver 04/16/2016   Noted [place] ABD   GERD (gastroesophageal reflux disease)    Heart murmur    at birth, no issues   History of heat stroke    with syncope   History of thrombocytopenia    prior to diagnosis HIV   HIV (human immunodeficiency virus infection) (HCC)    Hyperlipidemia    Hypertension    Hypothyroidism    Infertility, female    Obese    PCOS (polycystic ovarian syndrome)    Pneumonia 04/17/2016   SOB (shortness of breath)    Wears partial dentures    upper   Wrist fracture 11/2017   Left   Past Surgical History:  Procedure Laterality Date   HYSTEROSCOPY N/A 12/31/2017   Procedure: HYSTEROSCOPY and polypectomy;  Surgeon: Fermin Schwab, MD;  Location: [facility];  Service: Gynecology;  Laterality: N/A;   I & D EXTREMITY Left 07/15/2020   Procedure: PARTIAL EXCISION LEFT TIBIA;  Surgeon: [name], MD;  Location: [facility];  Service: Orthopedics;  Laterality: Left;   IR FLUORO GUIDE CV LINE LEFT  09/26/2020   IR RADIOLOGIST EVAL & MGMT  08/12/2020   maxillofacial surgery     Social History   Socioeconomic History   Marital status: Married    Spouse name: Not on file   Number of children: Not on file   Years of education: Not on file   Highest education level: Not on file  Occupational History   Occupation: Daycare Owner  Tobacco Use    Smoking status: Former    Current packs/day: 0.30    Average packs/day: 0.3 packs/day for 15.0 years (4.5 ttl pk-yrs)    Types: Cigarettes   Smokeless tobacco: Never  Vaping Use   Vaping status: Never Used  Substance and Sexual Activity   Alcohol use: Yes    Alcohol/week: 7.0 standard drinks of alcohol    Types: 7 Glasses of wine per week    Comment: wine  2-3 times a week   Drug use: Not Currently    Types: "Crack" cocaine    Comment: Previous hx of crack use - clean for 7 years    Sexual activity: Not Currently    Partners: Male    Birth control/protection: None    Comment: try to conceive   Other Topics Concern   Not on file  Social History Narrative   ** Merged History Encounter **       Social Drivers of Health   Financial Resource Strain: Low Risk  (07/31/2022)   Overall Financial Resource Strain (CARDIA)    Difficulty of Paying Living Expenses: Not hard at all  Food Insecurity: No Food Insecurity (07/31/2022)   Hunger Vital Sign    Worried About Running Out of Food in the Last Year:  Never true    Ran Out of Food in the Last Year: Never true  Transportation Needs: No Transportation Needs (07/31/2022)   PRAPARE - Administrator, Civil Service (Medical): No    Lack of Transportation (Non-Medical): No  Physical Activity: Sufficiently Active (07/31/2022)   Exercise Vital Sign    Days of Exercise per Week: 7 days    Minutes of Exercise per Session: 30 min  Stress: No Stress Concern Present (07/31/2022)   [employer] of Occupational Health - Occupational Stress Questionnaire    Feeling of Stress : Not at all  Social Connections: Moderately Integrated (07/31/2022)   Social Connection and Isolation Panel [NHANES]    Frequency of Communication with Friends and Family: More than three times a week    Frequency of Social Gatherings with Friends and Family: Twice a week    Attends Religious Services: Never    Database administrator or Organizations: Yes    Attends  Banker Meetings: Never    Marital Status: Married  Recent Concern: Social Connections - Moderately Isolated (06/12/2022)   Social Connection and Isolation Panel [NHANES]    Frequency of Communication with Friends and Family: More than three times a week    Frequency of Social Gatherings with Friends and Family: More than three times a week    Attends Religious Services: Never    Database administrator or Organizations: No    Attends Engineer, structural: Not on file    Marital Status: Married   Family History  Problem Relation Age of Onset   Thyroid disease Mother    Hypothyroidism Mother    Depression Mother    Obesity Mother    Obesity Father    Depression Father    Hyperlipidemia Father    Hypertension Father    Allergies  Allergen Reactions   Guaifenesin Nausea And Vomiting   Hydrocodone Itching    Not allergic 10/13/20 (patient state she can tolerate)   Lactose Intolerance (Gi) Other (See Comments)    Upset stomach - can eat cheese and yogurt per patient   Latex Other (See Comments)    As a teenager when using latex condoms she would get a yeast infection.    Ancef [Cefazolin] Rash   Prior to Admission medications   Medication Sig Start Date End Date Taking? Authorizing Provider  acetaminophen (TYLENOL) 325 MG tablet Take 650 mg by mouth every 4 (four) hours as needed (pain.).   Yes [provider]  ALPRAZolam Prudy Feeler) 1 MG tablet Take 1 mg by mouth 3 (three) times daily as needed for anxiety. 05/03/23  Yes [provider]  diphenhydramine-acetaminophen (TYLENOL PM EXTRA STRENGTH) 25-500 MG TABS tablet Take 1 tablet by mouth at bedtime as needed. Patient taking differently: Take 2 tablets by mouth at bedtime. 04/30/23  Yes Ginnie Smart, MD  dolutegravir-lamiVUDine ([name]) 50-300 MG tablet Take 1 tablet by mouth daily. Patient taking differently: Take 1 tablet by mouth every evening. 03/15/23  Yes Ginnie Smart, MD   gabapentin (NEURONTIN) 300 MG capsule Take 2 capsules (600 mg total) by mouth 3 (three) times daily. 05/24/23  Yes Ginnie Smart, MD  ibuprofen (ADVIL) 200 MG tablet Take 400 mg by mouth every 4 (four) hours as needed (pain.).   Yes [provider]  irbesartan (AVAPRO) 150 MG tablet TAKE 1 TABLET(150 MG) BY MOUTH EVERY EVENING 06/08/22  Yes Ginnie Smart, MD  oxyCODONE (OXY IR/ROXICODONE) 5 MG immediate release  tablet Take 5 mg by mouth every 4 (four) hours as needed (pain.).   Yes [provider]  Semaglutide,0.25 or 0.5MG /DOS, 2 MG/3ML SOPN Inject 2 mg into the skin once a week. 06/04/23  Yes Ginnie Smart, MD  Vilazodone HCl (VIIBRYD) 40 MG TABS Take 40 mg by mouth daily. 02/01/23  Yes [provider]  albuterol (VENTOLIN HFA) 108 (90 Base) MCG/ACT inhaler INHALE 1 PUFF INTO THE LUNGS EVERY 6 HOURS AS NEEDED FOR WHEEZING OR SHORTNESS OF BREATH 06/05/22   Ginnie Smart, MD  Blood Glucose Monitoring Suppl (ONETOUCH VERIO REFLECT) w/Device KIT Use to check blood sugar twice daily while on steroids, once daily otherwise 06/14/22   Ginnie Smart, MD  budesonide-formoterol (SYMBICORT) 80-4.5 MCG/ACT inhaler Inhale 2 puffs into the lungs daily. Patient taking differently: Inhale 2 puffs into the lungs daily as needed (respiratory issues). 06/12/22   Ginnie Smart, MD  glucose blood (ONETOUCH VERIO) test strip Use to check blood sugar twice daily while on steroids, once daily otherwise 06/14/22   Ginnie Smart, MD  Lancets [facility] DELICA PLUS [place]) MISC Use to check blood sugar twice daily while on steroids, once daily otherwise 06/14/22   Ginnie Smart, MD     Positive ROS: All other systems have been reviewed and were otherwise negative with the exception of those mentioned in the HPI and as above.  Physical Exam:  Vitals:   06/11/23 1001  BP: 136/80  Pulse: (!) 103  Resp: 18  Temp: 98.1 F (36.7 C)  SpO2: 95%   General:  Alert, no acute distress Cardiovascular: No pedal edema Respiratory: No cyanosis, no use of accessory musculature GI: No organomegaly, abdomen is soft and non-tender Skin: No lesions in the area of chief complaint Neurologic: Sensation intact distally Psychiatric: Patient is competent for consent with normal mood and affect Lymphatic: No axillary or cervical lymphadenopathy  MUSCULOSKELETAL: Left foot in a splint.Exposed forefoot WWP. No TTP proximal to splint.  Assessment: Left foot midfoot fracture dislocations.   Plan: Plan for ORIF left foot fractures and dislocations.  We discussed the risks, benefits and alternatives of surgery which include but are not limited to wound healing complications, infection, nonunion, malunion, need for further surgery, damage to surrounding structures and continued pain.  They understand there is no guarantees to an acceptable outcome.  After weighing these risks they opted to proceed with surgery.     Terance Hart, MD    06/11/2023 10:51 AM

## 2023-06-11 NOTE — Anesthesia Preprocedure Evaluation (Addendum)
Anesthesia Evaluation  Patient identified by MRN, date of birth, ID band Patient awake    Reviewed: Allergy & Precautions, NPO status , Patient's Chart, lab work & pertinent test results  Airway Mallampati: III  TM Distance: >3 FB Neck ROM: Full    Dental no notable dental hx.    Pulmonary asthma , former smoker   Pulmonary exam normal        Cardiovascular hypertension, Pt. on medications Normal cardiovascular exam     Neuro/Psych  PSYCHIATRIC DISORDERS Anxiety Depression    negative neurological ROS     GI/Hepatic negative GI ROS,,,(+)     substance abuse    Endo/Other  diabetes  Class 3 obesityPatient on GLP-1 Agonist  Renal/GU negative Renal ROS     Musculoskeletal negative musculoskeletal ROS (+)    Abdominal  (+) + obese  Peds  Hematology  (+) Blood dyscrasia, anemia , HIV  Anesthesia Other Findings LEFT FOOT MIDFOOT FRACTURE DISLOCATION WITH LISFRANC DISRUPTION  Reproductive/Obstetrics Hcg negative                             Anesthesia Physical Anesthesia Plan  ASA: 3  Anesthesia Plan: General   Post-op Pain Management:    Induction: Intravenous  PONV Risk Score and Plan: 3 and Ondansetron, Dexamethasone and Treatment may vary due to age or medical condition  Airway Management Planned: Oral ETT  Additional Equipment:   Intra-op Plan:   Post-operative Plan: Extubation in OR  Informed Consent: I have reviewed the patients History and Physical, chart, labs and discussed the procedure including the risks, benefits and alternatives for the proposed anesthesia with the patient or authorized representative who has indicated his/her understanding and acceptance.     Dental advisory given  Plan Discussed with: CRNA  Anesthesia Plan Comments: (Will crossmatch 2 units PRBC's and transfuse 1 unit pre-op/intra-op)       Anesthesia Quick Evaluation

## 2023-06-11 NOTE — Discharge Instructions (Signed)
 DR. Susa Simmonds FOOT & ANKLE SURGERY POST-OP INSTRUCTIONS   Pain Management The numbing medicine and your leg will last around 18 hours, take a dose of your pain medicine as soon as you feel it wearing off to avoid rebound pain. Keep your foot elevated above heart level.  Make sure that your heel hangs free ('floats'). Take all prescribed medication as directed. If taking narcotic pain medication you may want to use an over-the-counter stool softener to avoid constipation. You may take over-the-counter NSAIDs (ibuprofen, naproxen, etc.) as well as over-the-counter acetaminophen as directed on the packaging as a supplement for your pain and may also use it to wean away from the prescription medication.  Activity Non-weightbearing Keep splint intact  First Postoperative Visit Your first postop visit will be at least 2 weeks after surgery.  This should be scheduled when you schedule surgery. If you do not have a postoperative visit scheduled please call [phone] to schedule an appointment. At the appointment your incision will be evaluated for suture removal, x-rays will be obtained if necessary.  General Instructions Swelling is very common after foot and ankle surgery.  It often takes 3 months for the foot and ankle to begin to feel comfortable.  Some amount of swelling will persist for 6-12 months. DO NOT change the dressing.  If there is a problem with the dressing (too tight, loose, gets wet, etc.) please contact Dr. Donnie Mesa office. DO NOT get the dressing wet.  For showers you can use an over-the-counter cast cover or wrap a washcloth around the top of your dressing and then cover it with a plastic bag and tape it to your leg. DO NOT soak the incision (no tubs, pools, bath, etc.) until you have approval from Dr. Susa Simmonds.  Contact Dr. Garret Reddish office or go to Emergency Room if: Temperature above 101 F. Increasing pain that is unresponsive to pain medication or elevation Excessive redness or  swelling in your foot Dressing problems - excessive bloody drainage, looseness or tightness, or if dressing gets wet Develop pain, swelling, warmth, or discoloration of your calf

## 2023-06-12 ENCOUNTER — Telehealth: Payer: Self-pay | Admitting: Infectious Diseases

## 2023-06-12 ENCOUNTER — Encounter: Payer: Self-pay | Admitting: *Deleted

## 2023-06-12 ENCOUNTER — Telehealth: Payer: Self-pay | Admitting: *Deleted

## 2023-06-12 ENCOUNTER — Encounter (HOSPITAL_COMMUNITY): Payer: Self-pay | Admitting: Orthopaedic Surgery

## 2023-06-12 ENCOUNTER — Other Ambulatory Visit: Payer: Self-pay | Admitting: Infectious Diseases

## 2023-06-12 LAB — TYPE AND SCREEN
ABO/RH(D): A POS
Antibody Screen: NEGATIVE
Unit division: 0
Unit division: 0

## 2023-06-12 LAB — BPAM RBC
Blood Product Expiration Date: 202503162359
Blood Product Expiration Date: 202503172359
ISSUE DATE / TIME: 202502181320
Unit Type and Rh: 6200
Unit Type and Rh: 6200

## 2023-06-12 NOTE — Telephone Encounter (Signed)
Spoke with pt.  Was taking oxycodone for pain. Was up all night with pain. Now on dilaudid.  Feels dizzy, lightheaded. "I think I have pott's disease.... When I stand up I get tunnel vision....gets dizzy when bending over". "I'll come in when I am able to get out of the house."   Had dark BM 2 days before surgery. Also had a lot of blueberries.  No recent ozempeic, no recent n/v. No hematemesis. Can't leave house for 2 weeks when she gets hard cast. Currently has ACE bandages.   Taking 2 advil, 2 oxycodone q2h alternating.  Was advised to increase to 3 qdvil and hydromorphone today.  I suggested stopping advil.  Will work on getting home health to go to her house to draw her blood.

## 2023-06-12 NOTE — Telephone Encounter (Signed)
Pt had ORIF of her R foot yesterday.  Had hgb done showing 5.8. She received 1u PRBC Pt texted me and suggested she needs to be seen by heme. I called pt back to get more information (BRBPR, melena, hematuria....).  I left VM and asked her to stop asa and ibuprofen. Will call again.  She has pcp/IMTS new visit 06-24-23.

## 2023-06-12 NOTE — Telephone Encounter (Signed)
PCS forms completed and faxed (Feb. 14,2025) to [facility] [employer] [phone] (faxed by [facility])

## 2023-06-12 NOTE — Op Note (Signed)
Lindsay French female 43 y.o. 06/11/2023  PreOperative Diagnosis: Left first metatarsal fracture Left second metatarsal fracture Left fourth metatarsal fracture Left medial cuneiform fracture First tarsometatarsal joint disruption Second tarsometatarsal joint disruption Lisfranc joint disruption Third tarsometatarsal joint disruption  Cuboid fracture  PostOperative Diagnosis: same  PROCEDURE: Open reduction internal fixation first metatarsal fracture Open reduction internal fixation second metatarsal fracture Open reduction internal fixation medial cuneiform fracture Open reduction internal fixation of first tarsometatarsal joint Open reduction internal fixation of second tarsal metatarsal joint Open reduction internal fixation of the Lisfranc joint Open treatment third tarsometatarsal joint Open reduction fourth metatarsal fracture Closed treatment cuboid fracture   SURGEON: Dub Mikes, MD  ASSISTANT: Jesse [place], PA-C was necessary for patient positioning, prep, drape and assistance with fracture reduction and placement of hardware  ANESTHESIA: General with infiltration of local anesthesia quarter percent Marcaine plain  FINDINGS: See below  IMPLANTS: Arthrex 2.4 mm locking plate, 2.5 millimeters screw  INDICATIONS:43 y.o. female sustained the above injury.  She had disruption of her Lisfranc joint with the fractures noted above.  She had significant instability through her midfoot.  She was indicated for surgery due to the nature of her injuries.  Of note, patient was noted to have a low hemoglobin around 5.8 in the preoperative area.  She was given 1 unit of PRBCs.  She is instructed to follow-up with her primary care physician to discuss workup.  She was asymptomatic. Patient understood the risks, benefits and alternatives to surgery which include but are not limited to wound healing complications, infection, nonunion, malunion, need for further surgery as well  as damage to surrounding structures. They also understood the potential for continued pain in that there were no guarantees of acceptable outcome After weighing these risks the patient opted to proceed with surgery.  PROCEDURE: Patient was identified in the preoperative holding area.  The left foot was marked by myself.  Consent was signed by myself and the patient.  Block was performed by anesthesia in the preoperative holding area.  Patient was taken to the operative suite and placed supine on the operative table.  General anesthesia was induced without difficulty. Bump was placed under the operative hip and bone foam was used.  All bony prominences were well padded.  Tourniquet was placed on the operative thigh.  Preoperative antibiotics were given. The extremity was prepped and draped in the usual sterile fashion and surgical timeout was performed.  The limb was elevated and the tourniquet was inflated to 250 mmHg.  We began by making a longitudinal incision overlying the second tarsometatarsal joint.  This was taken sharply down through skin and subcutaneous tissue.  Blunt dissection was used to mobilize the skin medially and laterally.  There were a few branches of the superficial peroneal nerve in the field which were protected through the entirety of the case.  Then the fascia overlying the extensor hallucis brevis muscle belly was incised in line with the incision.  The muscle belly was mobilized.  The tendon sheath was incised distally along the line of the tendon to allow for retraction.  The muscle was retracted and held with Carollee Massed.  Then the neurovascular bundle was identified.  An incision was made medially and laterally to the neurovascular bundle and it was elevated in a subperiosteal fashion.  A vessel loop was placed around it to protected through the entirety of the case which was done.  Then the second tarsometatarsal joint was identified.  There was frank instability of  the Lisfranc  joint between the second metatarsal base and the medial cuneiform.  There was hematoma and consolidated fibrous tissue within this area.  The intercuneiform joint was inspected.  There was gross instability of the second tarsometatarsal joint.  A Glorious Peach was placed within the intercuneiform joint and the Lisfranc joint there was gross instability.  Using a rongeur the fibrous tissue within the Lisfranc joint was cleared.  There was fracturing of the second metatarsal base with displacement of the fracture fragments as well as fracturing of the medial cuneiform. Dissection was carried over medially to gain access to the first tarsometatarsal joint and first metatarsal.  Open reduction of first metatarsal base fracture: The first metatarsal was identified.  There is fracturing of the base of the first metatarsal and of the plantar aspect of the middle cuneiform.  Using a Therapist, nutritional we are able to mobilize the fracture fragment.  The fracture fragment was quite small.  Using a rondure fibrous tissue was removed from the base of the first metatarsal and from the fracture fragment.  The joint was inspected.  There is some impaction of the plantar aspect of the medial cuneiform and some subluxation of the first tarsometatarsal joint.  We turned our attention back to the dorsal foot.  Open reduction internal fixation of first tarsal metatarsal joint: Separate deep incision was created to gain access to the first tarsal metatarsal joint dorsally.  There was gross instability of the joint.  It was subluxated and was in a valgus position.  Under direct visualization we are able to reduce the first tarsometatarsal joint into a near-anatomic position and then a K wire was placed across the joint from the medial aspect of the first metatarsal base into the cuneiform bone to provisionally stabilize the joint.  We then selected a 5 hole dorsal locking plate.  The plate was placed overlying the first tarsometatarsal joint  and a combination of locking and nonlocking screws were used to fixate the joint.    Open treatment of the second metatarsal base fracture: We turned our attention to the fracture of the second metatarsal base.  Freer elevator was placed within the Lisfranc space and the fracture fragment was fully mobilized. fracturing was mobilized with the freer and with a rongeur.  Interposed hematoma and fibrous tissue was removed.  The fracture fragment was fully mobilized allowing for reduction. Then using a Glorious Peach it was reduced into a better position plantarly.  This was held in place with a Glorious Peach.  Then using a rongeur small portion of comminuted bone was removed from the space.   Open reduction of medial cuneiform fracture: we then proceeded with reduction of the medial cuneiform.  The fracture fragments were within the interspace between the second metatarsal base.  The fracture fragments were mobilized fully with the Freer and interposed tissue was removed with a rongeur.  Glorious Peach was used to acceptably mobilize the fracture fragments and were able to reduce them under direct visualization and hold them provisionally.  Open reduction internal fixation of second tarsometatarsal joint: We then proceeded to inspect the second tarsometatarsal joint.  There was continued dorsal subluxation of the second metatarsal base in relationship to the middle cuneiform.  Using a freer elevator the second metatarsal base was reduced into a better position under direct visualization.  then a K wire was placed from the base of the second metatarsal across the second tarsometatarsal joint stabilizing the joint provisionally. Then with this joint held reduced a pointed reduction  forceps was used to address the Lisfranc joint.  Open reduction internal fixation of the Lisfranc joint: Using a pointed reduction forceps from the base of the second metatarsal to the medial cuneiform we are able to reduce the Lisfranc joint in a near  anatomic position.  Fluoroscopy confirmed acceptable reduction of the intercuneiform joints, second tarsometatarsal joint and Lisfranc joint.  Then a K wire was placed from the second metatarsal base across the Lisfranc joint into the medial cuneiform.  Then appropriate position was confirmed fluoroscopically.  Then we proceeded to drill across the K wire in place a solid fully threaded screw across the Lisfranc joint.  After this the K wires and clamp were removed and there was maintenance of stability and reduction of the intercuneiform joint, second tarsometatarsal joint and Lisfranc joint.  We then inspected the first tarsometatarsal joint.  Open reduction of the third tarsometatarsal joint We then proceeded with separate deep incision to gain access to the base of the third tarsal metatarsal joint.  With reduction of the Lisfranc joint second tarsometatarsal joint there is slightly still some subluxation of the third tarsal metatarsal joint.  We then were able to mobilize this joint into a better position.  Once fixation for the Lisfranc joint was placed the third tarsal metatarsal joint remains stable.  This was confirmed fluoroscopically.   Open reduction of fourth metatarsal base fracture We then proceeded with open reduction of the fourth metatarsal base fracture.  There is mild displacement of the fracture fragment.  Freer elevator was used to bluntly dissected soft tissue along the lateral aspect of the foot to gain access to the fourth metatarsal.  Fluoroscopy was used to confirm appropriate reduction of the fourth metatarsal fracture after manipulation and exposure of the fracture site.  Closed treatment of cuboid fracture.   There is fracturing of the cuboid.  Fluoroscopy was used to inspect the cuboid fracture and was found to be acceptably reduced without the need for open treatment.  We plan for closed treatment of the cuboid fracture.  Final fluoroscopic images were obtained.  The K  wires were removed.  The joints remained stable with the internal fixation.  The wound was irrigated with normal saline.  The neurovascular bundle was inspected and found to be without injury.  Then the retinacular tissue was closed using 3-0 Monocryl suture.  Skin and subcutaneous tissue was closed using 3-0 Monocryl and 3-0 nylon suture.  Tourniquet was released.  She was placed in a short leg nonweightbearing splint.  Patient was then awakened from anesthesia and taken to recovery in stable condition.  No complications.  Counts were correct.    POST OPERATIVE INSTRUCTIONS: Nonweightbearing to operative extremity Keep splint dry Follow-up in 2 weeks for splint removal, suture removal of appropriate and placement of a short leg cast   TOURNIQUET TIME:less than 2 hours  BLOOD LOSS:  Minimal         DRAINS: none         SPECIMEN: none       COMPLICATIONS:  * No complications entered in OR log *         Disposition: PACU - hemodynamically stable.         Condition: stable

## 2023-06-13 ENCOUNTER — Other Ambulatory Visit: Payer: Self-pay | Admitting: Infectious Diseases

## 2023-06-13 DIAGNOSIS — W19XXXA Unspecified fall, initial encounter: Secondary | ICD-10-CM

## 2023-06-13 DIAGNOSIS — M25562 Pain in left knee: Secondary | ICD-10-CM

## 2023-06-13 DIAGNOSIS — S93325A Dislocation of tarsometatarsal joint of left foot, initial encounter: Secondary | ICD-10-CM

## 2023-06-14 ENCOUNTER — Other Ambulatory Visit: Payer: Self-pay | Admitting: Infectious Diseases

## 2023-06-14 ENCOUNTER — Other Ambulatory Visit: Payer: Self-pay | Admitting: *Deleted

## 2023-06-14 DIAGNOSIS — E1143 Type 2 diabetes mellitus with diabetic autonomic (poly)neuropathy: Secondary | ICD-10-CM

## 2023-06-14 MED ORDER — GABAPENTIN 300 MG PO CAPS: 600.0000 mg | ORAL_CAPSULE | Freq: Three times a day (TID) | ORAL | 3 refills | Status: DC

## 2023-06-18 ENCOUNTER — Other Ambulatory Visit: Payer: Self-pay | Admitting: Infectious Diseases

## 2023-06-18 DIAGNOSIS — F909 Attention-deficit hyperactivity disorder, unspecified type: Secondary | ICD-10-CM

## 2023-06-18 DIAGNOSIS — F418 Other specified anxiety disorders: Secondary | ICD-10-CM

## 2023-06-18 DIAGNOSIS — G4700 Insomnia, unspecified: Secondary | ICD-10-CM

## 2023-06-18 DIAGNOSIS — D509 Iron deficiency anemia, unspecified: Secondary | ICD-10-CM

## 2023-06-18 MED ORDER — ALPRAZOLAM 1 MG PO TABS: 1.0000 mg | ORAL_TABLET | Freq: Three times a day (TID) | ORAL | 1 refills | Status: DC | PRN

## 2023-06-19 ENCOUNTER — Other Ambulatory Visit (INDEPENDENT_AMBULATORY_CARE_PROVIDER_SITE_OTHER): Payer: Medicaid Other

## 2023-06-19 DIAGNOSIS — Z113 Encounter for screening for infections with a predominantly sexual mode of transmission: Secondary | ICD-10-CM | POA: Diagnosis present

## 2023-06-19 DIAGNOSIS — D509 Iron deficiency anemia, unspecified: Secondary | ICD-10-CM

## 2023-06-19 DIAGNOSIS — B20 Human immunodeficiency virus [HIV] disease: Secondary | ICD-10-CM

## 2023-06-19 DIAGNOSIS — Z79899 Other long term (current) drug therapy: Secondary | ICD-10-CM

## 2023-06-19 LAB — T-HELPER CELL (CD4) - (RCID CLINIC ONLY)
CD4 % Helper T Cell: 46 % (ref 33–65)
CD4 T Cell Abs: 1067 /uL (ref 400–1790)

## 2023-06-19 NOTE — Addendum Note (Signed)
 Addended by: Bufford Spikes on: 06/19/2023 11:53 AM   Modules accepted: Orders

## 2023-06-20 ENCOUNTER — Encounter (HOSPITAL_COMMUNITY): Payer: Self-pay

## 2023-06-20 ENCOUNTER — Telehealth: Payer: Self-pay | Admitting: *Deleted

## 2023-06-20 LAB — CMP14 + ANION GAP
ALT: 12 [IU]/L (ref 0–32)
AST: 22 [IU]/L (ref 0–40)
Albumin: 3.9 g/dL (ref 3.9–4.9)
Alkaline Phosphatase: 90 [IU]/L (ref 44–121)
Anion Gap: 21 mmol/L — ABNORMAL HIGH (ref 10.0–18.0)
BUN/Creatinine Ratio: 13 (ref 9–23)
BUN: 6 mg/dL (ref 6–24)
Bilirubin Total: 0.4 mg/dL (ref 0.0–1.2)
CO2: 18 mmol/L — ABNORMAL LOW (ref 20–29)
Calcium: 9.5 mg/dL (ref 8.7–10.2)
Chloride: 100 mmol/L (ref 96–106)
Creatinine, Ser: 0.45 mg/dL — ABNORMAL LOW (ref 0.57–1.00)
Globulin, Total: 2.4 g/dL (ref 1.5–4.5)
Glucose: 112 mg/dL — ABNORMAL HIGH (ref 70–99)
Potassium: 4.3 mmol/L (ref 3.5–5.2)
Sodium: 139 mmol/L (ref 134–144)
Total Protein: 6.3 g/dL (ref 6.0–8.5)
eGFR: 124 mL/min/{1.73_m2} (ref >59)

## 2023-06-20 NOTE — Telephone Encounter (Signed)
 Call to Bed Control to do Direct Admission for patient per request from Dr. Ninetta Lights.  Patient is on list.  Admission for Anemia in a Med/Surg bed.  Patient was notified and will await call from Admission to come to the hospital for Admission.

## 2023-06-21 ENCOUNTER — Inpatient Hospital Stay (HOSPITAL_COMMUNITY)
Admission: EM | Admit: 2023-06-21 | Discharge: 2023-06-22 | DRG: 378 | Disposition: A | Discharge status: Home / Self Care | Payer: Medicaid Other | Source: Ambulatory Visit | Attending: Internal Medicine | Admitting: Internal Medicine

## 2023-06-21 ENCOUNTER — Other Ambulatory Visit: Payer: Self-pay | Admitting: Student

## 2023-06-21 DIAGNOSIS — F32A Depression, unspecified: Secondary | ICD-10-CM | POA: Diagnosis present

## 2023-06-21 DIAGNOSIS — Z8249 Family history of ischemic heart disease and other diseases of the circulatory system: Secondary | ICD-10-CM | POA: Diagnosis not present

## 2023-06-21 DIAGNOSIS — K219 Gastro-esophageal reflux disease without esophagitis: Secondary | ICD-10-CM | POA: Diagnosis present

## 2023-06-21 DIAGNOSIS — D500 Iron deficiency anemia secondary to blood loss (chronic): Secondary | ICD-10-CM | POA: Diagnosis present

## 2023-06-21 DIAGNOSIS — E739 Lactose intolerance, unspecified: Secondary | ICD-10-CM | POA: Diagnosis present

## 2023-06-21 DIAGNOSIS — F410 Panic disorder [episodic paroxysmal anxiety] without agoraphobia: Secondary | ICD-10-CM | POA: Diagnosis present

## 2023-06-21 DIAGNOSIS — Z7985 Long-term (current) use of injectable non-insulin antidiabetic drugs: Secondary | ICD-10-CM

## 2023-06-21 DIAGNOSIS — S92901K Unspecified fracture of right foot, subsequent encounter for fracture with nonunion: Secondary | ICD-10-CM

## 2023-06-21 DIAGNOSIS — K922 Gastrointestinal hemorrhage, unspecified: Principal | ICD-10-CM

## 2023-06-21 DIAGNOSIS — J45909 Unspecified asthma, uncomplicated: Secondary | ICD-10-CM | POA: Diagnosis not present

## 2023-06-21 DIAGNOSIS — S92909K Unspecified fracture of unspecified foot, subsequent encounter for fracture with nonunion: Secondary | ICD-10-CM

## 2023-06-21 DIAGNOSIS — W19XXXD Unspecified fall, subsequent encounter: Secondary | ICD-10-CM | POA: Diagnosis present

## 2023-06-21 DIAGNOSIS — E785 Hyperlipidemia, unspecified: Secondary | ICD-10-CM | POA: Diagnosis present

## 2023-06-21 DIAGNOSIS — E538 Deficiency of other specified B group vitamins: Secondary | ICD-10-CM

## 2023-06-21 DIAGNOSIS — B20 Human immunodeficiency virus [HIV] disease: Secondary | ICD-10-CM | POA: Diagnosis present

## 2023-06-21 DIAGNOSIS — E119 Type 2 diabetes mellitus without complications: Secondary | ICD-10-CM | POA: Diagnosis present

## 2023-06-21 DIAGNOSIS — Z87891 Personal history of nicotine dependence: Secondary | ICD-10-CM | POA: Diagnosis not present

## 2023-06-21 DIAGNOSIS — K250 Acute gastric ulcer with hemorrhage: Secondary | ICD-10-CM

## 2023-06-21 DIAGNOSIS — Z888 Allergy status to other drugs, medicaments and biological substances status: Secondary | ICD-10-CM

## 2023-06-21 DIAGNOSIS — Z79899 Other long term (current) drug therapy: Secondary | ICD-10-CM

## 2023-06-21 DIAGNOSIS — E039 Hypothyroidism, unspecified: Secondary | ICD-10-CM | POA: Diagnosis present

## 2023-06-21 DIAGNOSIS — Z7951 Long term (current) use of inhaled steroids: Secondary | ICD-10-CM | POA: Diagnosis not present

## 2023-06-21 DIAGNOSIS — Z885 Allergy status to narcotic agent status: Secondary | ICD-10-CM | POA: Diagnosis not present

## 2023-06-21 DIAGNOSIS — Z83438 Family history of other disorder of lipoprotein metabolism and other lipidemia: Secondary | ICD-10-CM | POA: Diagnosis not present

## 2023-06-21 DIAGNOSIS — Z91040 Latex allergy status: Secondary | ICD-10-CM | POA: Diagnosis not present

## 2023-06-21 DIAGNOSIS — K254 Chronic or unspecified gastric ulcer with hemorrhage: Secondary | ICD-10-CM | POA: Diagnosis present

## 2023-06-21 DIAGNOSIS — Z818 Family history of other mental and behavioral disorders: Secondary | ICD-10-CM

## 2023-06-21 DIAGNOSIS — K264 Chronic or unspecified duodenal ulcer with hemorrhage: Secondary | ICD-10-CM | POA: Diagnosis present

## 2023-06-21 DIAGNOSIS — K259 Gastric ulcer, unspecified as acute or chronic, without hemorrhage or perforation: Secondary | ICD-10-CM | POA: Diagnosis present

## 2023-06-21 DIAGNOSIS — K921 Melena: Secondary | ICD-10-CM

## 2023-06-21 DIAGNOSIS — I10 Essential (primary) hypertension: Secondary | ICD-10-CM | POA: Diagnosis present

## 2023-06-21 DIAGNOSIS — K2970 Gastritis, unspecified, without bleeding: Secondary | ICD-10-CM

## 2023-06-21 DIAGNOSIS — F17210 Nicotine dependence, cigarettes, uncomplicated: Secondary | ICD-10-CM | POA: Diagnosis present

## 2023-06-21 DIAGNOSIS — K3189 Other diseases of stomach and duodenum: Secondary | ICD-10-CM | POA: Diagnosis not present

## 2023-06-21 DIAGNOSIS — D509 Iron deficiency anemia, unspecified: Secondary | ICD-10-CM | POA: Diagnosis not present

## 2023-06-21 DIAGNOSIS — S92902D Unspecified fracture of left foot, subsequent encounter for fracture with routine healing: Secondary | ICD-10-CM

## 2023-06-21 DIAGNOSIS — K269 Duodenal ulcer, unspecified as acute or chronic, without hemorrhage or perforation: Secondary | ICD-10-CM | POA: Diagnosis not present

## 2023-06-21 LAB — CBC
HCT: 21.4 % — ABNORMAL LOW (ref 36.0–46.0)
Hematocrit: 22 % — ABNORMAL LOW (ref 34.0–46.6)
Hemoglobin: 7 g/dL — CL (ref 11.1–15.9)
Hemoglobin: 6.5 g/dL — CL (ref 12.0–15.0)
MCH: 28.6 pg (ref 26.6–33.0)
MCH: 28.1 pg (ref 26.0–34.0)
MCHC: 31.8 g/dL (ref 31.5–35.7)
MCHC: 30.4 g/dL (ref 30.0–36.0)
MCV: 90 fL (ref 79–97)
MCV: 92.6 fL (ref 80.0–100.0)
NRBC: 1 % — ABNORMAL HIGH (ref 0–0)
Platelets: 553 10*3/uL — ABNORMAL HIGH (ref 150–450)
Platelets: 430 10*3/uL — ABNORMAL HIGH (ref 150–400)
RBC: 2.45 x10E6/uL — CL (ref 3.77–5.28)
RBC: 2.31 MIL/uL — ABNORMAL LOW (ref 3.87–5.11)
RDW: 18.4 % — ABNORMAL HIGH (ref 11.7–15.4)
RDW: 19.2 % — ABNORMAL HIGH (ref 11.5–15.5)
WBC: 14.7 10*3/uL — ABNORMAL HIGH (ref 3.4–10.8)
WBC: 11.7 10*3/uL — ABNORMAL HIGH (ref 4.0–10.5)
nRBC: 0.7 % — ABNORMAL HIGH (ref 0.0–0.2)

## 2023-06-21 LAB — FERRITIN: Ferritin: 6 ng/mL — ABNORMAL LOW (ref 11–307)

## 2023-06-21 LAB — BASIC METABOLIC PANEL
Anion gap: 10 (ref 5–15)
BUN: 6 mg/dL (ref 6–20)
CO2: 24 mmol/L (ref 22–32)
Calcium: 8.9 mg/dL (ref 8.9–10.3)
Chloride: 103 mmol/L (ref 98–111)
Creatinine, Ser: 0.52 mg/dL (ref 0.44–1.00)
GFR, Estimated: >60 mL/min (ref >60)
Glucose, Bld: 110 mg/dL — ABNORMAL HIGH (ref 70–99)
Potassium: 3.7 mmol/L (ref 3.5–5.1)
Sodium: 137 mmol/L (ref 135–145)

## 2023-06-21 LAB — HEMOGLOBIN A1C
Hgb A1c MFr Bld: 5.1 % (ref 4.8–5.6)
Mean Plasma Glucose: 99.67 mg/dL

## 2023-06-21 LAB — FOLATE: Folate: 5.2 ng/mL — ABNORMAL LOW (ref >5.9)

## 2023-06-21 LAB — VITAMIN B12: Vitamin B-12: 329 pg/mL (ref 180–914)

## 2023-06-21 LAB — HEMOGLOBIN AND HEMATOCRIT, BLOOD
HCT: 25.3 % — ABNORMAL LOW (ref 36.0–46.0)
Hemoglobin: 7.6 g/dL — ABNORMAL LOW (ref 12.0–15.0)

## 2023-06-21 LAB — HIV-1 RNA QUANT-NO REFLEX-BLD

## 2023-06-21 LAB — IRON AND TIBC
Iron: 17 ug/dL — ABNORMAL LOW (ref 28–170)
Saturation Ratios: 4 % — ABNORMAL LOW (ref 10.4–31.8)
TIBC: 448 ug/dL (ref 250–450)
UIBC: 431 ug/dL

## 2023-06-21 LAB — RPR: RPR Ser Ql: NONREACTIVE

## 2023-06-21 LAB — LIPID PANEL
Chol/HDL Ratio: 4.2 {ratio} (ref 0.0–4.4)
Cholesterol, Total: 182 mg/dL (ref 100–199)
HDL: 43 mg/dL (ref >39)
LDL Chol Calc (NIH): 110 mg/dL — ABNORMAL HIGH (ref 0–99)
Triglycerides: 166 mg/dL — ABNORMAL HIGH (ref 0–149)
VLDL Cholesterol Cal: 29 mg/dL (ref 5–40)

## 2023-06-21 LAB — PREPARE RBC (CROSSMATCH)

## 2023-06-21 LAB — LACTIC ACID, PLASMA: Lactic Acid, Venous: 0.7 mmol/L (ref 0.5–1.9)

## 2023-06-21 MED ORDER — VILAZODONE HCL 20 MG PO TABS
40.0000 mg | ORAL_TABLET | Freq: Every day | ORAL | Status: DC
  Administered 2023-06-21 – 2023-06-22 (×2): 40 mg via ORAL
  Filled 2023-06-21 (×2): qty 2

## 2023-06-21 MED ORDER — DOLUTEGRAVIR-LAMIVUDINE 50-300 MG PO TABS
1.0000 | ORAL_TABLET | Freq: Every evening | ORAL | Status: DC
  Administered 2023-06-21: 1 via ORAL
  Filled 2023-06-21 (×2): qty 1

## 2023-06-21 MED ORDER — GABAPENTIN 300 MG PO CAPS
600.0000 mg | ORAL_CAPSULE | Freq: Three times a day (TID) | ORAL | Status: DC
  Administered 2023-06-21 – 2023-06-22 (×4): 600 mg via ORAL
  Filled 2023-06-21 (×4): qty 2

## 2023-06-21 MED ORDER — FOLIC ACID 1 MG PO TABS
1.0000 mg | ORAL_TABLET | Freq: Every day | ORAL | Status: DC
  Administered 2023-06-21 – 2023-06-22 (×2): 1 mg via ORAL
  Filled 2023-06-21 (×2): qty 1

## 2023-06-21 MED ORDER — OXYCODONE HCL 5 MG PO TABS
5.0000 mg | ORAL_TABLET | Freq: Four times a day (QID) | ORAL | Status: DC | PRN
  Administered 2023-06-21 (×4): 5 mg via ORAL
  Filled 2023-06-21 (×4): qty 1

## 2023-06-21 MED ORDER — SODIUM CHLORIDE 0.9% IV SOLUTION: Freq: Once | INTRAVENOUS | Status: AC

## 2023-06-21 MED ORDER — ALPRAZOLAM 0.5 MG PO TABS
1.0000 mg | ORAL_TABLET | Freq: Three times a day (TID) | ORAL | Status: DC | PRN
  Filled 2023-06-21: qty 2

## 2023-06-21 MED ORDER — IRBESARTAN 150 MG PO TABS
150.0000 mg | ORAL_TABLET | Freq: Every day | ORAL | Status: DC
  Administered 2023-06-21 – 2023-06-22 (×2): 150 mg via ORAL
  Filled 2023-06-21 (×2): qty 1

## 2023-06-21 MED ORDER — PANTOPRAZOLE SODIUM 40 MG IV SOLR
40.0000 mg | Freq: Two times a day (BID) | INTRAVENOUS | Status: DC
  Administered 2023-06-21 – 2023-06-22 (×3): 40 mg via INTRAVENOUS
  Filled 2023-06-21 (×3): qty 10

## 2023-06-21 MED ORDER — IRON SUCROSE 200 MG IVPB - SIMPLE MED
200.0000 mg | Status: DC
  Filled 2023-06-21: qty 110

## 2023-06-21 MED ORDER — ACETAMINOPHEN 325 MG PO TABS
650.0000 mg | ORAL_TABLET | ORAL | Status: DC | PRN
  Administered 2023-06-21 – 2023-06-22 (×2): 650 mg via ORAL
  Filled 2023-06-21 (×2): qty 2

## 2023-06-21 MED ORDER — POLYETHYLENE GLYCOL 3350 17 G PO PACK: 17.0000 g | PACK | Freq: Every day | ORAL | Status: DC | PRN

## 2023-06-21 MED ORDER — SODIUM CHLORIDE 0.9 % IV SOLN
200.0000 mg | INTRAVENOUS | Status: DC
  Administered 2023-06-21 – 2023-06-22 (×2): 200 mg via INTRAVENOUS
  Filled 2023-06-21: qty 200
  Filled 2023-06-21: qty 10

## 2023-06-21 NOTE — Plan of Care (Signed)
  Problem: Education: Goal: Knowledge of General Education information will improve Description: Including pain rating scale, medication(s)/side effects and non-pharmacologic comfort measures Outcome: Progressing   Problem: Health Behavior/Discharge Planning: Goal: Ability to manage health-related needs will improve Outcome: Progressing   Problem: Nutrition: Goal: Adequate nutrition will be maintained Outcome: Progressing   Problem: Clinical Measurements: Goal: Ability to maintain clinical measurements within normal limits will improve Outcome: Not Progressing

## 2023-06-21 NOTE — Progress Notes (Signed)
 HD#0 SUBJECTIVE:  Patient Summary: Lindsay French is a 43 YO  female with PMHx of hypertension, HIV, depression/anxiety, who presented as a direct admission from clinic due to acute anemia with a hemoglobin of 7.0.   Overnight Events: No acute events overnight  Interim History: Patient was evaluated at bedside.  Denies any nausea, vomiting, dizziness, or lightheadedness. Discussed plan for that day and that she will likely be seen by GI for evaluation of upper GI bleeding. Pain adequately controlled. No other acute concerns at this time.   OBJECTIVE:  Vital Signs: Vitals:   06/20/23 2351 06/21/23 0104 06/21/23 0440  BP: 109/67  118/70  Pulse: 86  87  Resp: 16  16  Temp: 98.5 F (36.9 C)  98.5 F (36.9 C)  TempSrc: Oral  Oral  SpO2: 98% 96% 96%   Supplemental O2: Room Air SpO2: 96 %  There were no vitals filed for this visit.  No intake or output data in the 24 hours ending 06/21/23 0708 Net IO Since Admission: No IO data has been entered for this period [06/21/23 0708]  Physical Exam: Physical Exam Constitutional:      Appearance: Normal appearance.  HENT:     Head: Normocephalic and atraumatic.  Cardiovascular:     Rate and Rhythm: Normal rate and regular rhythm.  Pulmonary:     Effort: Pulmonary effort is normal.     Breath sounds: Normal breath sounds.  Abdominal:     General: Abdomen is flat. Bowel sounds are normal.     Palpations: Abdomen is soft.  Musculoskeletal:     Comments: Left foot elevated and wrapped  Skin:    General: Skin is warm.  Neurological:     Mental Status: She is alert. Mental status is at baseline.  Psychiatric:        Mood and Affect: Mood normal.        Behavior: Behavior normal.    CBC    Component Value Date/Time   WBC 14.7 (H) 06/19/2023 1132   WBC 7.3 06/11/2023 1030   RBC 2.45 (LL) 06/19/2023 1132   RBC 1.87 (L) 06/11/2023 1030   HGB 7.0 (LL) 06/19/2023 1132   HCT 22.0 (L) 06/19/2023 1132   PLT 553 (H) 06/19/2023 1132    MCV 90 06/19/2023 1132   MCH 28.6 06/19/2023 1132   MCH 31.0 06/11/2023 1030   MCHC 31.8 06/19/2023 1132   MCHC 30.4 06/11/2023 1030   RDW 18.4 (H) 06/19/2023 1132   LYMPHSABS 1.8 11/09/2020 1800   MONOABS 0.8 11/09/2020 1800   EOSABS 0.2 11/09/2020 1800   BASOSABS 0.0 11/09/2020 1800      Latest Ref Rng & Units 06/19/2023   11:32 AM 06/11/2023   10:30 AM 01/01/2022    8:42 AM  BMP  Glucose 70 - 99 mg/dL 161  096  045   BUN 6 - 24 mg/dL 6  11  3    Creatinine 0.57 - 1.00 mg/dL 4.09  8.11  9.14   BUN/Creat Ratio 9 - 23 13   6    Sodium 134 - 144 mmol/L 139  139  140   Potassium 3.5 - 5.2 mmol/L 4.3  3.6  4.6   Chloride 96 - 106 mmol/L 100  102  100   CO2 20 - 29 mmol/L 18  25  23    Calcium 8.7 - 10.2 mg/dL 9.5  9.3  9.0     ASSESSMENT/PLAN:  Assessment: Principal Problem:   Upper GI bleed  Plan: #Upper  GI Bleed #Normocytic Anemia  #Leukocytosis, improving Leukocytosis improved from 14.7 to 11.7.  Remains afebrile so likely secondary from stress of surgery.  Hemoglobin also decreased from 7.0 to 6.5 so will receive 1 unit of blood.  GI consulted with concern for upper GI bleed, so patient will remain NPO for possible further work-up.  Will continue to also hold aspirin. - Follow-up afternoon H&H - Trend CBC - Follow-up iron panel, folate, and vitamin B12 - Follow-up GI recommendations-appreciate assistance  #Left Foot Midfoot Fracture Dislocation with Lisfranc Disruption  Stable.  Will continue to hold aspirin.  Patient will remain nonweightbearing and keep the splint dry.  Will continue multimodal pain regimen of Tylenol 650 mg every 4 hours as needed, gabapentin 600 mg 3 times daily, and oxycodone 5 mg every 6 hours as needed. - Continue multimodal pain regimen  Chronic Conditions: #HIV: Continue [name]  #Hypertension: Continue home irbesartan  #HLD: Hold home atorvastatin at this time  #Anxiety/Depression: Continue home vilazodone  #Panic Attacks: Continue home PRN  Xanax  Best Practice: Diet: NPO VTE: SCDs Start: 06/21/23 0104 Code: Full Therapy Recs: Non-weight bearing  DISPO: Anticipated discharge  TBD  to Home pending  anemia workup .  Signature: Morrie Sheldon, MD Internal Medicine Resident, PGY-1 [facility]  Pager: [phone]  Please contact the on call pager after 5 pm and on weekends at [phone].

## 2023-06-21 NOTE — H&P (Addendum)
 Date: 06/21/2023         Patient Name:  Lindsay French MRN: [id number]  DOB: 1980-06-18 Age / Sex: 43 y.o., female   PCP: Ginnie Smart, MD         Medical Service: Internal Medicine Teaching Service         Attending Physician: Dr. Ginnie Smart, MD     First Contact: Dr. Morrie Sheldon, MD Pager [phone]  Pager: [phone]  Second Contact: Dr. Rudene Christians, DO Pager [phone] Pager: [phone]       After Hours (After 5p/  First Contact Pager: [phone]  weekends / holidays): Second Contact Pager: [phone]   Chief Concern: Acute anemia  History of Present Illness: Lindsay French is a 43 y.o.  female with PMHx of hypertension, HIV, depression/anxiety, who presented as a direct admission from clinic due to acute anemia, Hb 7.0  On February 2nd,  she had a fall as a result of losing her balance, she fell from front porch, three steep steps down,unfortunately broke her left foot digits. Denies loss of consciousness or trauma to the head. She was prescribed Tylenol, Advil 400 mg Q6hr  and  oxycodone 5 mg for pain, was asked to stop the Advil due to black starry stools, which she complied. Stools did returned to normal color, no trace of darkness of blood in her stool. No abdominal pain, changes in bowel habits or any hx of colon cancer in the family.  Denies any new medicine, heavy menstrual bleeding. She has a Hx of  irregular menstrual cycle, once every 4-5 months as a result of fertility treatment?  She says she eats a lot of fruits and vegetables, pasta and fish. No fevers, night sweats or weight loss. Was told in the past she had a folate deficiency,but not on any supplementation. No cough, recent illness. Never had colonscopy or EGD.  She saw Dr.Adair  for surgery on 2/18 , and was noted to have a Hb 5.8 in the preoperative area.  She was asymptomatic.  She was given 1 unit of PRBCs, and proceeded with surgery. She has been taking aspirin daily after surgery, followed with Dr.  Ninetta Lights, on 2/19 where hb was 7.0.  At that visit, she was still having black stools, and was taking the aspiring for DVT ppx, and? Ibuprofen.  Pt consented to direct admission to IMTS, pending bed availabilities.   Allergies: Allergies  Allergen Reactions   Guaifenesin Nausea And Vomiting   Hydrocodone Itching    Not allergic 10/13/20 (patient state she can tolerate)   Lactose Intolerance (Gi) Other (See Comments)    Upset stomach - can eat cheese and yogurt per patient   Latex Other (See Comments)    As a teenager when using latex condoms she would get a yeast infection.    Ancef [Cefazolin] Rash   Past Medical History: HIV  Diabetes  HLD HTN Depression  Anxiety   Medications: Oxy 5mg  1-2 every 6 hours Gabapentin 600mg  every 6 hours since the surgeyr Dolutegravir-lamiVUDine ([name]) 50-300 MG tablet  Irbesartan 150 mg  Vilazodone HCl (VIIBRYD) 40 MG TABS  Xanax 1mg  for panic attack  Robaxin 500mg    Surgical History: Past Surgical History:  Procedure Laterality Date   HYSTEROSCOPY N/A 12/31/2017   Procedure: HYSTEROSCOPY and polypectomy;  Surgeon: Fermin Schwab, MD;  Location: [facility];  Service: Gynecology;  Laterality: N/A;   I & D EXTREMITY Left 07/15/2020   Procedure: PARTIAL EXCISION LEFT TIBIA;  Surgeon: [name], MD;  Location: [facility] OR;  Service: Orthopedics;  Laterality: Left;   IR FLUORO GUIDE CV LINE LEFT  09/26/2020   IR RADIOLOGIST EVAL & MGMT  08/12/2020   maxillofacial surgery     OPEN REDUCTION INTERNAL FIXATION (ORIF) FOOT LISFRANC FRACTURE Left 06/11/2023   Procedure: OPEN TREAMENT OF LEFT FIRST, SECOND, FOURTH METATARSALS, MEDIAL CUNEIFORM FRACTURE, CUBOID, FIRST SECOND THRID TARSOMETATARSAL AND LISFRANC JOINT;  Surgeon: Terance Hart, MD;  Location: [facility] OR;  Service: Orthopedics;  Laterality: Left;    Family History:  Family History  Problem Relation Age of Onset   Thyroid disease Mother    Hypothyroidism Mother     Depression Mother    Obesity Mother    Obesity Father    Depression Father    Hyperlipidemia Father    Hypertension Father    Social History:  Lives Husband disabled with chronic back pain  Occupation: she has a Audiological scientist. Support: Mother Level of Function:Dependent on most ADLs/IADLs with recent injury  PCP: Ginnie Smart, MD Substances: Denies cigarette use, cocaine/recreational drug use.  Physical Exam:  Blood pressure 109/67, pulse 86, temperature 98.5 F (36.9 C), temperature source Oral, resp. rate 16, SpO2 98%.  Constitutional: No acute distress HENT: normocephalic atraumatic, mucous membranes moist Cardiovascular: regular rate and rhythm, no m/r/g. Pulmonary/Chest: normal work of breathing on room air, lungs clear to auscultation bilaterally.  No crackles  Abdominal: soft, obese abdomen, non-tender, + bowel sounds.   Neurological: alert & oriented x 3 MSK: RLE  size > LLE( wrapped with bandage, with the boot on.) Skin: warm and dry Psych: Normal mood and affect Labs:     Latest Ref Rng & Units 06/19/2023   11:32 AM 06/11/2023   10:30 AM 06/26/2021    2:36 PM  CBC  WBC 3.4 - 10.8 x10E3/uL 14.7  7.3  13.4   Hemoglobin 11.1 - 15.9 g/dL 7.0  5.8  02.7   Hematocrit 34.0 - 46.6 % 22.0  19.1  38.1   Platelets 150 - 450 x10E3/uL 553  465  327         Latest Ref Rng & Units 06/19/2023   11:32 AM 06/11/2023   10:30 AM 01/01/2022    8:42 AM  CMP  Glucose 70 - 99 mg/dL 253  664  403   BUN 6 - 24 mg/dL 6  11  3    Creatinine 0.57 - 1.00 mg/dL 4.74  2.59  5.63   Sodium 134 - 144 mmol/L 139  139  140   Potassium 3.5 - 5.2 mmol/L 4.3  3.6  4.6   Chloride 96 - 106 mmol/L 100  102  100   CO2 20 - 29 mmol/L 18  25  23    Calcium 8.7 - 10.2 mg/dL 9.5  9.3  9.0   Total Protein 6.0 - 8.5 g/dL 6.3   6.8   Total Bilirubin 0.0 - 1.2 mg/dL 0.4   <8.7   Alkaline Phos 44 - 121 IU/L 90   114   AST 0 - 40 IU/L 22   106   ALT 0 - 32 IU/L 12   53      Images and other studies: No  results found.    Assessment & Plan:  Garielle Mroz is a 43 y.o.  female with PMHx of hypertension, HIV, depression/anxiety, who presented as a direct admission from clinic due to acute anemia, and admitted for upper GI bleed.  Principal Problem:   Upper  GI bleed  # Upper GI bleed  # Asymptomatic Anemia  Presented as a direct admit from the clinic, by Dr. Ninetta Lights.  Hb 7.0, HDS, satting on room air.  Differential diagnoses that were considered includes PUD vs. Esophageal/ gastric varices vs. Mallory weiss tear. I think UGIB is 2/2 to medication induced peptic ulcer disease, from both nsaids and aspirin use. No history of H. Pylori. I also considered esophageal/gastric varices, however less likely as she has no history of cirrhosis. Mallory weiss tear is less likley as she is not having hematemesis,. Since patient is hemodynamically stable, will consult GI in the morning for a possible EGD.  Will also workup anemia with ferritin, folate and vitamin B12.  - NPO  - Consult GI in the morning - Hold aspirin - IV Protonix 40 mg BID  - Anemia workup with ferritin, TIBC folate, vit B12.  # Leukocytosis WBC elevated to 14, I think this likely poor stress response of the surgery.  I considered a pulmonary source, however less likely not endorsing cough, satting normal on room air, and lung exam clear bilaterally.  The absence of lower abdominal pain, dysuria/increased frequency also makes UTI less likely.  -Monitor. -CBC with differential.  # LEFT FOOT MIDFOOT FRACTURE DISLOCATION WITH LISFRANC DISRUPTION  S/p surgery Dr. Susa Simmonds on 2/18. She will  follow-up in 2 weeks for splint removal, suture removal of appropriate and placement of a short leg cast. She is on aspirin for DVT ppx which we are holding due to acute anemia. Home regimen includes gabapentin, oxycodone and tylenol, endorses adequate pain control.   - Holding Asprin as above.  - Continue multimodal pain control - Non weight bearing  -  Keep splint dry   # HIV  Follows with Dr. Ninetta Lights, was last seen on 01/25, .  Most recent viral load < 20. Home HIV regimen includes dolutegravir-lamiVUDine ([name]) 50-300 MG tablet   - Continue [name].  # Type 2 diabetes  A1c 7.1 in 12/2021. She stopped taking ozempic. Normal fasting glucose on BMP. -Check A1c  # Hypertension Normotensive, home regimen includes irbesartan 150 mg. -Cw irbesartan 150 mg  #Hyperlipidemia  Elevated LDL. She is on atorvastatin 10mg , but has been out of refills for few days. -  Will restart atorvastatin 10mg   Chronic medical conditions:  # Anxiety/depression. - Cw Vilazodone 40 mg   # Hx of Panic attacks  - PRN Xanax 1 mg  Diet:  N.P.O VTE: SCDs IVF: None,None Code: Full   Signed: Laretta Bolster, MD 06/21/2023, 2:09 AM

## 2023-06-21 NOTE — Hospital Course (Addendum)
 Symptomatic anemia Upper GI bleed Iron and folate deficiency Patient presented to the ED 2/2 for a fall found to have a Lisfranc fracture of her foot.  During preop evaluation her hemoglobin was found to be less than 6.  She was given transfusion but no further evaluation was completed.  She was a direct admit by her primary care doctor.  On arrival her hemoglobin was 6.5.  She was given 1 unit PRBCs and GI was consulted.  She was found to have iron deficiency and folate deficiency.  It seems like she likely had an acute bleed prior to the fall.  Question if fall was secondary to symptomatic anemia.  She completed endoscopy that showed several ulcers that were nonbleeding, no stigmata of bleeding.  Biopsies were taken to rule out H. pylori.  GI wish to pursue colonoscopy, however patient deferred as she is currently nonweightbearing and she was worried about completing bowel prep for colonoscopy.  Hemoglobin was stable following 1 unit of transfusion.  She was discharged with addition of pantoprazole 40 mg twice daily, iron and folate supplementation.  She was given IV iron prior to discharge as well.  Leukocytosis WBC 12.2 in setting of recent surgery.  No signs of infection.  Left foot midfoot fracture dislocation with Lisfranc disruption status post surgery 2/18 She had surgery on 218 with Dr. Susa Simmonds.  She needs to follow-up for splint removal, suture removal and placement of short leg cast.  I am holding aspirin for DVT prophylaxis at discharge due to acute anemia.  Will continue home regimen of gabapentin, oxycodone, and Tylenol.  HIV Well-controlled, follows with Dr. Ninetta Lights continue to follow at discharge  Type 2 diabetes Diabetes well-controlled with A1c at 5.1.   Hypertension Continued irbesartan 150 mg  Hyperlipidemia Continue atorvastatin 10 mg  Anxiety/depression Continue vilazodone 40 mg  History of panic attacks Home as needed Xanax 1 mg to needed at discharge

## 2023-06-21 NOTE — Consult Note (Addendum)
 Consultation  Referring Provider: Ginnie Smart, MD Primary Care Physician:  Ginnie Smart, MD Primary Gastroenterologist:  unassigned  Reason for Consultation: Iron deficiency anemia, recent black stool  HPI: Lindsay French is a 43 y.o. female with history of diabetes mellitus, morbid obesity, GERD, hypertension, hyperlipidemia and HIV, on therapy with undetectable viral load. Patient had a fall at home in early February and sustained several fractures in her left foot.  At that time she was placed on pain management with Tylenol, oxycodone and ibuprofen alternating every 2 hours.  She was taking 400 mg of ibuprofen at a time.  Out 4 to 5 days into this regimen she had a very black tarry stool with blood tinged edges.  She thinks that her stool remained dark for few days after that but since has cleared. She had surgery on 06/11/2023 with open repair.  Preoperatively noted her hemoglobin was 5.8.  She was transfused prior to surgery. Ibuprofen was stopped, she was continued on oxycodone and started on 325 mg aspirin for DVT prophylaxis. She and her mom say that they have not seen any black stools over the past couple of weeks.  She is continuing to have quite a bit of pain but says overall it is significantly better than earlier in the month.  She is still nonweightbearing. She had outpatient labs done yesterday and was found to have a hemoglobin of 7 and subsequently was admitted.  Today WBC 11.7/hemoglobin 6.5/hematocrit 21.4/potassium 3.7/BUN 6/creatinine 0.52 Serum iron 17/ferritin 6/TIBC 448/iron sat 4  She is being transfused 1 unit of packed RBCs today, and IV iron has been ordered.  She has no complaints of abdominal pain or discomfort, no recent changes in bowel habits, no prior history of GI bleeding, no history of iron deficiency anemia that she is aware of.  No complaints of dysphagia or odynophagia, no nausea or vomiting. Patient says that she had been on Ozempic for  about 10 months stopped in January due to lack of insurance.  She had lost 70 pounds during that time.  She said that she did have nausea and vomiting issues when she first initiated the Ozempic and obviously decrease in appetite.  She thinks she has been told that she is B12 deficient in the past, she has been taking a vitamin over-the-counter.  She mentions that her grandmother was also B12 deficient. No family history of GI disease that patient is aware of. She has not had any regular menses over the past several years.  Hemoglobin was 12.6 in March 2023.   Past Medical History:  Diagnosis Date   ADHD    Anxiety    Asthma    usually with colds   Avascular necrosis (HCC)    Back pain    Depression    Diabetes mellitus without complication (HCC)    Elevated LFTs    Fatty liver 04/16/2016   Noted [place] ABD   GERD (gastroesophageal reflux disease)    Heart murmur    at birth, no issues   History of heat stroke    with syncope   History of thrombocytopenia    prior to diagnosis HIV   HIV (human immunodeficiency virus infection) (HCC)    Hyperlipidemia    Hypertension    Hypothyroidism    Infertility, female    Obese    PCOS (polycystic ovarian syndrome)    Pneumonia 04/17/2016   SOB (shortness of breath)    Wears partial dentures    upper  Wrist fracture 11/2017   Left    Past Surgical History:  Procedure Laterality Date   HYSTEROSCOPY N/A 12/31/2017   Procedure: HYSTEROSCOPY and polypectomy;  Surgeon: Fermin Schwab, MD;  Location: [facility];  Service: Gynecology;  Laterality: N/A;   I & D EXTREMITY Left 07/15/2020   Procedure: PARTIAL EXCISION LEFT TIBIA;  Surgeon: [name], MD;  Location: [facility] OR;  Service: Orthopedics;  Laterality: Left;   IR FLUORO GUIDE CV LINE LEFT  09/26/2020   IR RADIOLOGIST EVAL & MGMT  08/12/2020   maxillofacial surgery     OPEN REDUCTION INTERNAL FIXATION (ORIF) FOOT LISFRANC FRACTURE Left 06/11/2023   Procedure: OPEN  TREAMENT OF LEFT FIRST, SECOND, FOURTH METATARSALS, MEDIAL CUNEIFORM FRACTURE, CUBOID, FIRST SECOND THRID TARSOMETATARSAL AND LISFRANC JOINT;  Surgeon: Terance Hart, MD;  Location: [facility] OR;  Service: Orthopedics;  Laterality: Left;    Prior to Admission medications   Medication Sig Start Date End Date Taking? Authorizing Provider  acetaminophen (TYLENOL) 325 MG tablet Take 650 mg by mouth every 4 (four) hours as needed (pain.).    [provider]  albuterol (VENTOLIN HFA) 108 (90 Base) MCG/ACT inhaler INHALE 1 PUFF INTO THE LUNGS EVERY 6 HOURS AS NEEDED FOR WHEEZING OR SHORTNESS OF BREATH 06/05/22   Ginnie Smart, MD  ALPRAZolam Prudy Feeler) 1 MG tablet Take 1 tablet (1 mg total) by mouth 3 (three) times daily as needed for anxiety. 06/18/23   Ginnie Smart, MD  aspirin (BAYER ASPIRIN) 325 MG tablet Take 1 tablet by mouth for 30 DAYS for blood clot prevention. Start post op day 1 06/11/23   [place], Jonna Munro, PA-C  Blood Glucose Monitoring Suppl (ONETOUCH VERIO REFLECT) w/Device KIT Use to check blood sugar twice daily while on steroids, once daily otherwise 06/14/22   Ginnie Smart, MD  budesonide-formoterol [facility]) 80-4.5 MCG/ACT inhaler Inhale 2 puffs into the lungs daily. Patient taking differently: Inhale 2 puffs into the lungs daily as needed (respiratory issues). 06/12/22   Ginnie Smart, MD  diphenhydramine-acetaminophen (TYLENOL PM EXTRA STRENGTH) 25-500 MG TABS tablet Take 1 tablet by mouth at bedtime as needed. Patient taking differently: Take 2 tablets by mouth at bedtime. 04/30/23   Ginnie Smart, MD  dolutegravir-lamiVUDine ([name]) 50-300 MG tablet Take 1 tablet by mouth daily. Patient taking differently: Take 1 tablet by mouth every evening. 03/15/23   Ginnie Smart, MD  gabapentin (NEURONTIN) 300 MG capsule Take 2 capsules (600 mg total) by mouth 3 (three) times daily. 06/14/23   Ginnie Smart, MD  glucose blood (ONETOUCH VERIO) test strip Use  to check blood sugar twice daily while on steroids, once daily otherwise 06/14/22   Ginnie Smart, MD  HYDROmorphone (DILAUDID) 2 MG tablet Take 2 mg by mouth every 4 (four) hours as needed. 06/12/23   [provider]  ibuprofen (ADVIL) 200 MG tablet Take 400 mg by mouth every 4 (four) hours as needed (pain.).    [provider]  irbesartan (AVAPRO) 150 MG tablet TAKE 1 TABLET(150 MG) BY MOUTH EVERY EVENING 06/08/22   Ginnie Smart, MD  Lancets [facility] DELICA PLUS [place]) MISC Use to check blood sugar twice daily while on steroids, once daily otherwise 06/14/22   Ginnie Smart, MD  methocarbamol (ROBAXIN) 500 MG tablet Take 500 mg by mouth every 6 (six) hours as needed. 06/14/23   [provider]  oxyCODONE (OXY IR/ROXICODONE) 5 MG immediate release tablet Take 5 mg by mouth  every 4 (four) hours as needed (pain.).    [provider]  oxyCODONE (ROXICODONE) 5 MG immediate release tablet Take 1 tablet (5 mg total) by mouth every 4 (four) hours as needed. 06/11/23   [place], Jesse J, PA-C  oxyCODONE-acetaminophen (PERCOCET/ROXICET) 5-325 MG tablet Take 1-2 tablets by mouth every 6 (six) hours as needed. 06/14/23   [provider]  Semaglutide,0.25 or 0.5MG /DOS, 2 MG/3ML SOPN Inject 2 mg into the skin once a week. 06/04/23   Ginnie Smart, MD  Vilazodone HCl (VIIBRYD) 40 MG TABS Take 40 mg by mouth daily. 02/01/23   [provider]    Current Facility-Administered Medications  Medication Dose Route Frequency Provider Last Rate Last Admin   0.9 %  sodium chloride infusion (Manually program via Guardrails IV Fluids)   Intravenous Once Morrie Sheldon, MD       acetaminophen (TYLENOL) tablet 650 mg  650 mg Oral Q4H PRN Nooruddin, Jason Fila, MD       ALPRAZolam Prudy Feeler) tablet 1 mg  1 mg Oral TID PRN Nooruddin, Jason Fila, MD       dolutegravir-lamiVUDine ([name]) 50-300 MG per tablet 1 tablet  1 tablet Oral QPM Nooruddin, Jason Fila, MD       folic acid  (FOLVITE) tablet 1 mg  1 mg Oral Daily Ginnie Smart, MD       gabapentin (NEURONTIN) capsule 600 mg  600 mg Oral TID Nooruddin, Jason Fila, MD   600 mg at 06/21/23 0730   irbesartan (AVAPRO) tablet 150 mg  150 mg Oral Daily Nooruddin, Saad, MD   150 mg at 06/21/23 0730   iron sucrose (VENOFER) 200 mg in sodium chloride 0.9 % 100 mL IVPB  200 mg Intravenous Q24H Ginnie Smart, MD       oxyCODONE (Oxy IR/ROXICODONE) immediate release tablet 5 mg  5 mg Oral Q6H PRN Nooruddin, Saad, MD   5 mg at 06/21/23 0731   pantoprazole (PROTONIX) injection 40 mg  40 mg Intravenous Q12H Nooruddin, Jason Fila, MD   40 mg at 06/21/23 4098   Vilazodone HCl TABS 40 mg  40 mg Oral Daily Nooruddin, Jason Fila, MD   40 mg at 06/21/23 0731    Allergies as of 06/20/2023 - Reviewed 06/11/2023  Allergen Reaction Noted   Guaifenesin Nausea And Vomiting 03/03/2016   Hydrocodone Itching 01/16/2013   Lactose intolerance (gi) Other (See Comments) 07/11/2020   Latex Other (See Comments) 03/21/2016   Ancef [cefazolin] Rash 10/21/2020    Family History  Problem Relation Age of Onset   Thyroid disease Mother    Hypothyroidism Mother    Depression Mother    Obesity Mother    Obesity Father    Depression Father    Hyperlipidemia Father    Hypertension Father     Social History   Socioeconomic History   Marital status: Married    Spouse name: Not on file   Number of children: Not on file   Years of education: Not on file   Highest education level: Not on file  Occupational History   Occupation: Daycare Owner  Tobacco Use   Smoking status: Former    Current packs/day: 0.30    Average packs/day: 0.3 packs/day for 15.0 years (4.5 ttl pk-yrs)    Types: Cigarettes   Smokeless tobacco: Never  Vaping Use   Vaping status: Never Used  Substance and Sexual Activity   Alcohol use: Yes    Alcohol/week: 7.0 standard drinks of alcohol    Types: 7 Glasses of  wine per week    Comment: wine  2-3 times a week   Drug use: Not  Currently    Types: "Crack" cocaine    Comment: Previous hx of crack use - clean for 7 years    Sexual activity: Not Currently    Partners: Male    Birth control/protection: None    Comment: try to conceive   Other Topics Concern   Not on file  Social History Narrative   ** Merged History Encounter **       Social Drivers of Health   Financial Resource Strain: Low Risk  (07/31/2022)   Overall Financial Resource Strain (CARDIA)    Difficulty of Paying Living Expenses: Not hard at all  Food Insecurity: No Food Insecurity (07/31/2022)   Hunger Vital Sign    Worried About Running Out of Food in the Last Year: Never true    Ran Out of Food in the Last Year: Never true  Transportation Needs: No Transportation Needs (07/31/2022)   PRAPARE - Administrator, Civil Service (Medical): No    Lack of Transportation (Non-Medical): No  Physical Activity: Sufficiently Active (07/31/2022)   Exercise Vital Sign    Days of Exercise per Week: 7 days    Minutes of Exercise per Session: 30 min  Stress: No Stress Concern Present (07/31/2022)   [employer] of Occupational Health - Occupational Stress Questionnaire    Feeling of Stress : Not at all  Social Connections: Moderately Integrated (07/31/2022)   Social Connection and Isolation Panel [NHANES]    Frequency of Communication with Friends and Family: More than three times a week    Frequency of Social Gatherings with Friends and Family: Twice a week    Attends Religious Services: Never    Database administrator or Organizations: Yes    Attends Banker Meetings: Never    Marital Status: Married  Recent Concern: Social Connections - Moderately Isolated (06/12/2022)   Social Connection and Isolation Panel [NHANES]    Frequency of Communication with Friends and Family: More than three times a week    Frequency of Social Gatherings with Friends and Family: More than three times a week    Attends Religious Services: Never     Database administrator or Organizations: No    Attends Engineer, structural: Not on file    Marital Status: Married  Catering manager Violence: Not At Risk (07/31/2022)   Humiliation, Afraid, Rape, and Kick questionnaire    Fear of Current or Ex-Partner: No    Emotionally Abused: No    Physically Abused: No    Sexually Abused: No    Review of Systems: Pertinent positive and negative review of systems were noted in the above HPI section.  All other review of systems was otherwise negative.   Physical Exam: Vital signs in last 24 hours: Temp:  [98.5 F (36.9 C)-98.9 F (37.2 C)] 98.7 F (37.1 C) (02/28 1146) Pulse Rate:  [80-89] 80 (02/28 1146) Resp:  [16-18] 18 (02/28 1146) BP: (109-136)/(61-89) 136/89 (02/28 1146) SpO2:  [96 %-98 %] 98 % (02/28 1146) Last BM Date : 06/20/23 General:   Alert,  Well-developed, obese white female pleasant and cooperative in NAD-mom at bedside Head:  Normocephalic and atraumatic. Eyes:  Sclera clear, no icterus.   Conjunctiva pale Ears:  Normal auditory acuity. Nose:  No deformity, discharge,  or lesions. Mouth:  No deformity or lesions.   Neck:  Supple; no masses or thyromegaly.  Lungs:   Clear throughout to auscultation.   No wheezes, crackles, or rhonchi. Heart:  Regular rate and rhythm; no murmurs, clicks, rubs,  or gallops. Abdomen:  Soft,, nontender BS active,nonpalp mass or hsm.   Rectal: Not done Msk:  Symmetrical without gross deformities. . Pulses:  Normal pulses noted. Extremities: Left lower extremity bandaged. Neurologic:  Alert and  oriented x4;  grossly normal neurologically. Skin:  Intact without significant lesions or rashes.. Psych:  Alert and cooperative. Normal mood and affect.  Intake/Output from previous day: No intake/output data recorded. Intake/Output this shift: No intake/output data recorded.  Lab Results: Recent Labs    06/19/23 1132 06/21/23 0812  WBC 14.7* 11.7*  HGB 7.0* 6.5*  HCT 22.0* 21.4*   PLT 553* 430*   BMET Recent Labs    06/19/23 1132 06/21/23 0812  NA 139 137  K 4.3 3.7  CL 100 103  CO2 18* 24  GLUCOSE 112* 110*  BUN 6 6  CREATININE 0.45* 0.52  CALCIUM 9.5 8.9   LFT Recent Labs    06/19/23 1132  PROT 6.3  ALBUMIN 3.9  AST 22  ALT 12  ALKPHOS 90  BILITOT 0.4   PT/INR No results for input(s): "LABPROT", "INR" in the last 72 hours. Hepatitis Panel No results for input(s): "HEPBSAG", "HCVAB", "HEPAIGM", "HEPBIGM" in the last 72 hours.   IMPRESSION:  #68  43 year old white female with profound anemia, iron deficiency and recent history of melena about 3 weeks ago which was in the setting of higher dose NSAID use.  Etiology of the iron deficiency anemia is not entirely clear at this time though concerning for chronic low-grade GI blood loss.  I think she did have an episode of more acute bleeding while she was on high-dose ibuprofen earlier this month but that does not explain the underlying iron deficiency. We do not have any hemoglobin for comparison except from 2023 at which time hemoglobin was normal.  With the subacute melena, need to rule out NSAID induced peptic ulcer disease, erosive gastropathy,  For iron deficiency anemia, consider chronic gastropathy, AVMs, occult neoplasm  #2 recent fall with foot fractures, status post open repair 06/11/2023 #3 HIV-on therapy-undetectable viral load #4 morbid obesity-had been on Ozempic until January #5 diabetes mellitus 6.  Hypertension 7.  Hyperlipidemia  Plan; agree with transfusions to keep hemoglobin above 7 Agree with IV iron infusions No NSAIDs Twice daily PPI  Long discussion with the patient and her mom at bedside today.  I think she needs endoscopic evaluation with EGD and colonoscopy but she does not feel that she can handle a bowel prep at this time as she is still nonweightbearing and would like to delay the colonoscopy over the next several weeks. Will schedule for EGD with Dr. Lavon Paganini   in a.m. tomorrow NPO after midnight Pending on results that EGD can we can rediscuss timing of colonoscopy       Amy EsterwoodPA-C  06/21/2023, 11:57 AM  I have taken an interval history, thoroughly reviewed the chart and examined the patient. I agree with the Advanced Practitioner's note, impression and recommendations, and have recorded additional findings, impressions and recommendations below. I performed a substantive portion of this encounter (>50% time spent), including a complete performance of the medical decision making.  My additional thoughts are as follows:  Severe iron deficiency anemia, recent NSAID use after a foot fracture, though I agree it is not clear the reported melena weeks ago would entirely account for this.  EGD and colonoscopy warranted, the patient does not feel she can do a colonoscopy at this point until she has further recovered from her foot surgery. Hemoglobin improved with transfusion, then dropped from 7.02 days ago to 6.5 today.  1 additional unit of PRBCs and some IV iron administered today.  It should be noted she has normal B12 but is mildly deficient in folate (5.2), so folic acid replacement has also been started.  EGD tomorrow with Dr. Lavon Paganini.  She was agreeable after discussion of procedure and risks.  The benefits and risks of the planned procedure(s) were described in detail with the patient or (when appropriate) their health care proxy.  Risks were outlined as including, but not limited to, bleeding, infection, perforation, adverse medication reaction leading to cardiac or pulmonary decompensation, pancreatitis (if ERCP).  The limitation of incomplete mucosal visualization was also discussed.  No guarantees or warranties were given. Patient at increased risk for cardiopulmonary complications of procedure due to medical comorbidities.  (Severe anemia, morbid obesity)  Her mother was at the bedside for the entire visit and requests a call after  tomorrow's EGD.   Charlie Pitter III Office:[phone]

## 2023-06-21 NOTE — H&P (View-Only) (Signed)
 Consultation  Referring Provider: Ginnie Smart, MD Primary Care Physician:  Ginnie Smart, MD Primary Gastroenterologist:  unassigned  Reason for Consultation: Iron deficiency anemia, recent black stool  HPI: Lindsay French is a 43 y.o. female with history of diabetes mellitus, morbid obesity, GERD, hypertension, hyperlipidemia and HIV, on therapy with undetectable viral load. Patient had a fall at home in early February and sustained several fractures in her left foot.  At that time she was placed on pain management with Tylenol, oxycodone and ibuprofen alternating every 2 hours.  She was taking 400 mg of ibuprofen at a time.  Out 4 to 5 days into this regimen she had a very black tarry stool with blood tinged edges.  She thinks that her stool remained dark for few days after that but since has cleared. She had surgery on 06/11/2023 with open repair.  Preoperatively noted her hemoglobin was 5.8.  She was transfused prior to surgery. Ibuprofen was stopped, she was continued on oxycodone and started on 325 mg aspirin for DVT prophylaxis. She and her mom say that they have not seen any black stools over the past couple of weeks.  She is continuing to have quite a bit of pain but says overall it is significantly better than earlier in the month.  She is still nonweightbearing. She had outpatient labs done yesterday and was found to have a hemoglobin of 7 and subsequently was admitted.  Today WBC 11.7/hemoglobin 6.5/hematocrit 21.4/potassium 3.7/BUN 6/creatinine 0.52 Serum iron 17/ferritin 6/TIBC 448/iron sat 4  She is being transfused 1 unit of packed RBCs today, and IV iron has been ordered.  She has no complaints of abdominal pain or discomfort, no recent changes in bowel habits, no prior history of GI bleeding, no history of iron deficiency anemia that she is aware of.  No complaints of dysphagia or odynophagia, no nausea or vomiting. Patient says that she had been on Ozempic for  about 10 months stopped in January due to lack of insurance.  She had lost 70 pounds during that time.  She said that she did have nausea and vomiting issues when she first initiated the Ozempic and obviously decrease in appetite.  She thinks she has been told that she is B12 deficient in the past, she has been taking a vitamin over-the-counter.  She mentions that her grandmother was also B12 deficient. No family history of GI disease that patient is aware of. She has not had any regular menses over the past several years.  Hemoglobin was 12.6 in March 2023.   Past Medical History:  Diagnosis Date   ADHD    Anxiety    Asthma    usually with colds   Avascular necrosis (HCC)    Back pain    Depression    Diabetes mellitus without complication (HCC)    Elevated LFTs    Fatty liver 04/16/2016   Noted [place] ABD   GERD (gastroesophageal reflux disease)    Heart murmur    at birth, no issues   History of heat stroke    with syncope   History of thrombocytopenia    prior to diagnosis HIV   HIV (human immunodeficiency virus infection) (HCC)    Hyperlipidemia    Hypertension    Hypothyroidism    Infertility, female    Obese    PCOS (polycystic ovarian syndrome)    Pneumonia 04/17/2016   SOB (shortness of breath)    Wears partial dentures    upper  Wrist fracture 11/2017   Left    Past Surgical History:  Procedure Laterality Date   HYSTEROSCOPY N/A 12/31/2017   Procedure: HYSTEROSCOPY and polypectomy;  Surgeon: Fermin Schwab, MD;  Location: [facility];  Service: Gynecology;  Laterality: N/A;   I & D EXTREMITY Left 07/15/2020   Procedure: PARTIAL EXCISION LEFT TIBIA;  Surgeon: [name], MD;  Location: [facility] OR;  Service: Orthopedics;  Laterality: Left;   IR FLUORO GUIDE CV LINE LEFT  09/26/2020   IR RADIOLOGIST EVAL & MGMT  08/12/2020   maxillofacial surgery     OPEN REDUCTION INTERNAL FIXATION (ORIF) FOOT LISFRANC FRACTURE Left 06/11/2023   Procedure: OPEN  TREAMENT OF LEFT FIRST, SECOND, FOURTH METATARSALS, MEDIAL CUNEIFORM FRACTURE, CUBOID, FIRST SECOND THRID TARSOMETATARSAL AND LISFRANC JOINT;  Surgeon: Terance Hart, MD;  Location: [facility] OR;  Service: Orthopedics;  Laterality: Left;    Prior to Admission medications   Medication Sig Start Date End Date Taking? Authorizing Provider  acetaminophen (TYLENOL) 325 MG tablet Take 650 mg by mouth every 4 (four) hours as needed (pain.).    [provider]  albuterol (VENTOLIN HFA) 108 (90 Base) MCG/ACT inhaler INHALE 1 PUFF INTO THE LUNGS EVERY 6 HOURS AS NEEDED FOR WHEEZING OR SHORTNESS OF BREATH 06/05/22   Ginnie Smart, MD  ALPRAZolam Prudy Feeler) 1 MG tablet Take 1 tablet (1 mg total) by mouth 3 (three) times daily as needed for anxiety. 06/18/23   Ginnie Smart, MD  aspirin (BAYER ASPIRIN) 325 MG tablet Take 1 tablet by mouth for 30 DAYS for blood clot prevention. Start post op day 1 06/11/23   [place], Jonna Munro, PA-C  Blood Glucose Monitoring Suppl (ONETOUCH VERIO REFLECT) w/Device KIT Use to check blood sugar twice daily while on steroids, once daily otherwise 06/14/22   Ginnie Smart, MD  budesonide-formoterol [facility]) 80-4.5 MCG/ACT inhaler Inhale 2 puffs into the lungs daily. Patient taking differently: Inhale 2 puffs into the lungs daily as needed (respiratory issues). 06/12/22   Ginnie Smart, MD  diphenhydramine-acetaminophen (TYLENOL PM EXTRA STRENGTH) 25-500 MG TABS tablet Take 1 tablet by mouth at bedtime as needed. Patient taking differently: Take 2 tablets by mouth at bedtime. 04/30/23   Ginnie Smart, MD  dolutegravir-lamiVUDine ([name]) 50-300 MG tablet Take 1 tablet by mouth daily. Patient taking differently: Take 1 tablet by mouth every evening. 03/15/23   Ginnie Smart, MD  gabapentin (NEURONTIN) 300 MG capsule Take 2 capsules (600 mg total) by mouth 3 (three) times daily. 06/14/23   Ginnie Smart, MD  glucose blood (ONETOUCH VERIO) test strip Use  to check blood sugar twice daily while on steroids, once daily otherwise 06/14/22   Ginnie Smart, MD  HYDROmorphone (DILAUDID) 2 MG tablet Take 2 mg by mouth every 4 (four) hours as needed. 06/12/23   [provider]  ibuprofen (ADVIL) 200 MG tablet Take 400 mg by mouth every 4 (four) hours as needed (pain.).    [provider]  irbesartan (AVAPRO) 150 MG tablet TAKE 1 TABLET(150 MG) BY MOUTH EVERY EVENING 06/08/22   Ginnie Smart, MD  Lancets [facility] DELICA PLUS [place]) MISC Use to check blood sugar twice daily while on steroids, once daily otherwise 06/14/22   Ginnie Smart, MD  methocarbamol (ROBAXIN) 500 MG tablet Take 500 mg by mouth every 6 (six) hours as needed. 06/14/23   [provider]  oxyCODONE (OXY IR/ROXICODONE) 5 MG immediate release tablet Take 5 mg by mouth  every 4 (four) hours as needed (pain.).    [provider]  oxyCODONE (ROXICODONE) 5 MG immediate release tablet Take 1 tablet (5 mg total) by mouth every 4 (four) hours as needed. 06/11/23   [place], Jesse J, PA-C  oxyCODONE-acetaminophen (PERCOCET/ROXICET) 5-325 MG tablet Take 1-2 tablets by mouth every 6 (six) hours as needed. 06/14/23   [provider]  Semaglutide,0.25 or 0.5MG /DOS, 2 MG/3ML SOPN Inject 2 mg into the skin once a week. 06/04/23   Ginnie Smart, MD  Vilazodone HCl (VIIBRYD) 40 MG TABS Take 40 mg by mouth daily. 02/01/23   [provider]    Current Facility-Administered Medications  Medication Dose Route Frequency Provider Last Rate Last Admin   0.9 %  sodium chloride infusion (Manually program via Guardrails IV Fluids)   Intravenous Once Morrie Sheldon, MD       acetaminophen (TYLENOL) tablet 650 mg  650 mg Oral Q4H PRN Nooruddin, Jason Fila, MD       ALPRAZolam Prudy Feeler) tablet 1 mg  1 mg Oral TID PRN Nooruddin, Jason Fila, MD       dolutegravir-lamiVUDine ([name]) 50-300 MG per tablet 1 tablet  1 tablet Oral QPM Nooruddin, Jason Fila, MD       folic acid  (FOLVITE) tablet 1 mg  1 mg Oral Daily Ginnie Smart, MD       gabapentin (NEURONTIN) capsule 600 mg  600 mg Oral TID Nooruddin, Jason Fila, MD   600 mg at 06/21/23 0730   irbesartan (AVAPRO) tablet 150 mg  150 mg Oral Daily Nooruddin, Saad, MD   150 mg at 06/21/23 0730   iron sucrose (VENOFER) 200 mg in sodium chloride 0.9 % 100 mL IVPB  200 mg Intravenous Q24H Ginnie Smart, MD       oxyCODONE (Oxy IR/ROXICODONE) immediate release tablet 5 mg  5 mg Oral Q6H PRN Nooruddin, Saad, MD   5 mg at 06/21/23 0731   pantoprazole (PROTONIX) injection 40 mg  40 mg Intravenous Q12H Nooruddin, Jason Fila, MD   40 mg at 06/21/23 4098   Vilazodone HCl TABS 40 mg  40 mg Oral Daily Nooruddin, Jason Fila, MD   40 mg at 06/21/23 0731    Allergies as of 06/20/2023 - Reviewed 06/11/2023  Allergen Reaction Noted   Guaifenesin Nausea And Vomiting 03/03/2016   Hydrocodone Itching 01/16/2013   Lactose intolerance (gi) Other (See Comments) 07/11/2020   Latex Other (See Comments) 03/21/2016   Ancef [cefazolin] Rash 10/21/2020    Family History  Problem Relation Age of Onset   Thyroid disease Mother    Hypothyroidism Mother    Depression Mother    Obesity Mother    Obesity Father    Depression Father    Hyperlipidemia Father    Hypertension Father     Social History   Socioeconomic History   Marital status: Married    Spouse name: Not on file   Number of children: Not on file   Years of education: Not on file   Highest education level: Not on file  Occupational History   Occupation: Daycare Owner  Tobacco Use   Smoking status: Former    Current packs/day: 0.30    Average packs/day: 0.3 packs/day for 15.0 years (4.5 ttl pk-yrs)    Types: Cigarettes   Smokeless tobacco: Never  Vaping Use   Vaping status: Never Used  Substance and Sexual Activity   Alcohol use: Yes    Alcohol/week: 7.0 standard drinks of alcohol    Types: 7 Glasses of  wine per week    Comment: wine  2-3 times a week   Drug use: Not  Currently    Types: "Crack" cocaine    Comment: Previous hx of crack use - clean for 7 years    Sexual activity: Not Currently    Partners: Male    Birth control/protection: None    Comment: try to conceive   Other Topics Concern   Not on file  Social History Narrative   ** Merged History Encounter **       Social Drivers of Health   Financial Resource Strain: Low Risk  (07/31/2022)   Overall Financial Resource Strain (CARDIA)    Difficulty of Paying Living Expenses: Not hard at all  Food Insecurity: No Food Insecurity (07/31/2022)   Hunger Vital Sign    Worried About Running Out of Food in the Last Year: Never true    Ran Out of Food in the Last Year: Never true  Transportation Needs: No Transportation Needs (07/31/2022)   PRAPARE - Administrator, Civil Service (Medical): No    Lack of Transportation (Non-Medical): No  Physical Activity: Sufficiently Active (07/31/2022)   Exercise Vital Sign    Days of Exercise per Week: 7 days    Minutes of Exercise per Session: 30 min  Stress: No Stress Concern Present (07/31/2022)   [employer] of Occupational Health - Occupational Stress Questionnaire    Feeling of Stress : Not at all  Social Connections: Moderately Integrated (07/31/2022)   Social Connection and Isolation Panel [NHANES]    Frequency of Communication with Friends and Family: More than three times a week    Frequency of Social Gatherings with Friends and Family: Twice a week    Attends Religious Services: Never    Database administrator or Organizations: Yes    Attends Banker Meetings: Never    Marital Status: Married  Recent Concern: Social Connections - Moderately Isolated (06/12/2022)   Social Connection and Isolation Panel [NHANES]    Frequency of Communication with Friends and Family: More than three times a week    Frequency of Social Gatherings with Friends and Family: More than three times a week    Attends Religious Services: Never     Database administrator or Organizations: No    Attends Engineer, structural: Not on file    Marital Status: Married  Catering manager Violence: Not At Risk (07/31/2022)   Humiliation, Afraid, Rape, and Kick questionnaire    Fear of Current or Ex-Partner: No    Emotionally Abused: No    Physically Abused: No    Sexually Abused: No    Review of Systems: Pertinent positive and negative review of systems were noted in the above HPI section.  All other review of systems was otherwise negative.   Physical Exam: Vital signs in last 24 hours: Temp:  [98.5 F (36.9 C)-98.9 F (37.2 C)] 98.7 F (37.1 C) (02/28 1146) Pulse Rate:  [80-89] 80 (02/28 1146) Resp:  [16-18] 18 (02/28 1146) BP: (109-136)/(61-89) 136/89 (02/28 1146) SpO2:  [96 %-98 %] 98 % (02/28 1146) Last BM Date : 06/20/23 General:   Alert,  Well-developed, obese white female pleasant and cooperative in NAD-mom at bedside Head:  Normocephalic and atraumatic. Eyes:  Sclera clear, no icterus.   Conjunctiva pale Ears:  Normal auditory acuity. Nose:  No deformity, discharge,  or lesions. Mouth:  No deformity or lesions.   Neck:  Supple; no masses or thyromegaly.  Lungs:   Clear throughout to auscultation.   No wheezes, crackles, or rhonchi. Heart:  Regular rate and rhythm; no murmurs, clicks, rubs,  or gallops. Abdomen:  Soft,, nontender BS active,nonpalp mass or hsm.   Rectal: Not done Msk:  Symmetrical without gross deformities. . Pulses:  Normal pulses noted. Extremities: Left lower extremity bandaged. Neurologic:  Alert and  oriented x4;  grossly normal neurologically. Skin:  Intact without significant lesions or rashes.. Psych:  Alert and cooperative. Normal mood and affect.  Intake/Output from previous day: No intake/output data recorded. Intake/Output this shift: No intake/output data recorded.  Lab Results: Recent Labs    06/19/23 1132 06/21/23 0812  WBC 14.7* 11.7*  HGB 7.0* 6.5*  HCT 22.0* 21.4*   PLT 553* 430*   BMET Recent Labs    06/19/23 1132 06/21/23 0812  NA 139 137  K 4.3 3.7  CL 100 103  CO2 18* 24  GLUCOSE 112* 110*  BUN 6 6  CREATININE 0.45* 0.52  CALCIUM 9.5 8.9   LFT Recent Labs    06/19/23 1132  PROT 6.3  ALBUMIN 3.9  AST 22  ALT 12  ALKPHOS 90  BILITOT 0.4   PT/INR No results for input(s): "LABPROT", "INR" in the last 72 hours. Hepatitis Panel No results for input(s): "HEPBSAG", "HCVAB", "HEPAIGM", "HEPBIGM" in the last 72 hours.   IMPRESSION:  #68  43 year old white female with profound anemia, iron deficiency and recent history of melena about 3 weeks ago which was in the setting of higher dose NSAID use.  Etiology of the iron deficiency anemia is not entirely clear at this time though concerning for chronic low-grade GI blood loss.  I think she did have an episode of more acute bleeding while she was on high-dose ibuprofen earlier this month but that does not explain the underlying iron deficiency. We do not have any hemoglobin for comparison except from 2023 at which time hemoglobin was normal.  With the subacute melena, need to rule out NSAID induced peptic ulcer disease, erosive gastropathy,  For iron deficiency anemia, consider chronic gastropathy, AVMs, occult neoplasm  #2 recent fall with foot fractures, status post open repair 06/11/2023 #3 HIV-on therapy-undetectable viral load #4 morbid obesity-had been on Ozempic until January #5 diabetes mellitus 6.  Hypertension 7.  Hyperlipidemia  Plan; agree with transfusions to keep hemoglobin above 7 Agree with IV iron infusions No NSAIDs Twice daily PPI  Long discussion with the patient and her mom at bedside today.  I think she needs endoscopic evaluation with EGD and colonoscopy but she does not feel that she can handle a bowel prep at this time as she is still nonweightbearing and would like to delay the colonoscopy over the next several weeks. Will schedule for EGD with Dr. Lavon Paganini   in a.m. tomorrow NPO after midnight Pending on results that EGD can we can rediscuss timing of colonoscopy       Amy EsterwoodPA-C  06/21/2023, 11:57 AM  I have taken an interval history, thoroughly reviewed the chart and examined the patient. I agree with the Advanced Practitioner's note, impression and recommendations, and have recorded additional findings, impressions and recommendations below. I performed a substantive portion of this encounter (>50% time spent), including a complete performance of the medical decision making.  My additional thoughts are as follows:  Severe iron deficiency anemia, recent NSAID use after a foot fracture, though I agree it is not clear the reported melena weeks ago would entirely account for this.  EGD and colonoscopy warranted, the patient does not feel she can do a colonoscopy at this point until she has further recovered from her foot surgery. Hemoglobin improved with transfusion, then dropped from 7.02 days ago to 6.5 today.  1 additional unit of PRBCs and some IV iron administered today.  It should be noted she has normal B12 but is mildly deficient in folate (5.2), so folic acid replacement has also been started.  EGD tomorrow with Dr. Lavon Paganini.  She was agreeable after discussion of procedure and risks.  The benefits and risks of the planned procedure(s) were described in detail with the patient or (when appropriate) their health care proxy.  Risks were outlined as including, but not limited to, bleeding, infection, perforation, adverse medication reaction leading to cardiac or pulmonary decompensation, pancreatitis (if ERCP).  The limitation of incomplete mucosal visualization was also discussed.  No guarantees or warranties were given. Patient at increased risk for cardiopulmonary complications of procedure due to medical comorbidities.  (Severe anemia, morbid obesity)  Her mother was at the bedside for the entire visit and requests a call after  tomorrow's EGD.   Charlie Pitter III Office:[phone]

## 2023-06-22 ENCOUNTER — Encounter (HOSPITAL_COMMUNITY)
Admission: EM | Disposition: A | Discharge status: Home / Self Care | Payer: Self-pay | Source: Ambulatory Visit | Attending: Infectious Diseases

## 2023-06-22 ENCOUNTER — Inpatient Hospital Stay (HOSPITAL_COMMUNITY): Admitting: Anesthesiology

## 2023-06-22 ENCOUNTER — Encounter (HOSPITAL_COMMUNITY): Payer: Self-pay | Admitting: Infectious Diseases

## 2023-06-22 DIAGNOSIS — K2970 Gastritis, unspecified, without bleeding: Secondary | ICD-10-CM

## 2023-06-22 DIAGNOSIS — K259 Gastric ulcer, unspecified as acute or chronic, without hemorrhage or perforation: Secondary | ICD-10-CM

## 2023-06-22 DIAGNOSIS — K269 Duodenal ulcer, unspecified as acute or chronic, without hemorrhage or perforation: Secondary | ICD-10-CM

## 2023-06-22 DIAGNOSIS — K921 Melena: Secondary | ICD-10-CM

## 2023-06-22 DIAGNOSIS — Z87891 Personal history of nicotine dependence: Secondary | ICD-10-CM

## 2023-06-22 DIAGNOSIS — D509 Iron deficiency anemia, unspecified: Secondary | ICD-10-CM

## 2023-06-22 DIAGNOSIS — K922 Gastrointestinal hemorrhage, unspecified: Secondary | ICD-10-CM | POA: Diagnosis not present

## 2023-06-22 DIAGNOSIS — K3189 Other diseases of stomach and duodenum: Secondary | ICD-10-CM

## 2023-06-22 DIAGNOSIS — I10 Essential (primary) hypertension: Secondary | ICD-10-CM

## 2023-06-22 DIAGNOSIS — J45909 Unspecified asthma, uncomplicated: Secondary | ICD-10-CM | POA: Diagnosis not present

## 2023-06-22 DIAGNOSIS — K250 Acute gastric ulcer with hemorrhage: Secondary | ICD-10-CM

## 2023-06-22 HISTORY — PX: BIOPSY: SHX5522

## 2023-06-22 HISTORY — PX: ESOPHAGOGASTRODUODENOSCOPY (EGD) WITH PROPOFOL: SHX5813

## 2023-06-22 LAB — BASIC METABOLIC PANEL
Anion gap: 9 (ref 5–15)
Anion gap: 9 (ref 5–15)
BUN: 5 mg/dL — ABNORMAL LOW (ref 6–20)
BUN: <5 mg/dL — ABNORMAL LOW (ref 6–20)
CO2: 24 mmol/L (ref 22–32)
CO2: 24 mmol/L (ref 22–32)
Calcium: 9.1 mg/dL (ref 8.9–10.3)
Calcium: 9.4 mg/dL (ref 8.9–10.3)
Chloride: 106 mmol/L (ref 98–111)
Chloride: 105 mmol/L (ref 98–111)
Creatinine, Ser: 0.56 mg/dL (ref 0.44–1.00)
Creatinine, Ser: 0.65 mg/dL (ref 0.44–1.00)
GFR, Estimated: >60 mL/min (ref >60)
GFR, Estimated: >60 mL/min (ref >60)
Glucose, Bld: 107 mg/dL — ABNORMAL HIGH (ref 70–99)
Glucose, Bld: 111 mg/dL — ABNORMAL HIGH (ref 70–99)
Potassium: 3.6 mmol/L (ref 3.5–5.1)
Potassium: 3.4 mmol/L — ABNORMAL LOW (ref 3.5–5.1)
Sodium: 139 mmol/L (ref 135–145)
Sodium: 138 mmol/L (ref 135–145)

## 2023-06-22 LAB — CBC
HCT: 24.6 % — ABNORMAL LOW (ref 36.0–46.0)
Hemoglobin: 7.5 g/dL — ABNORMAL LOW (ref 12.0–15.0)
MCH: 27.5 pg (ref 26.0–34.0)
MCHC: 30.5 g/dL (ref 30.0–36.0)
MCV: 90.1 fL (ref 80.0–100.0)
Platelets: 413 10*3/uL — ABNORMAL HIGH (ref 150–400)
RBC: 2.73 MIL/uL — ABNORMAL LOW (ref 3.87–5.11)
RDW: 18.8 % — ABNORMAL HIGH (ref 11.5–15.5)
WBC: 12.2 10*3/uL — ABNORMAL HIGH (ref 4.0–10.5)
nRBC: 0.8 % — ABNORMAL HIGH (ref 0.0–0.2)

## 2023-06-22 LAB — TYPE AND SCREEN
ABO/RH(D): A POS
Antibody Screen: NEGATIVE
Unit division: 0

## 2023-06-22 LAB — BPAM RBC
Blood Product Expiration Date: 202503242359
ISSUE DATE / TIME: 202502281151
Unit Type and Rh: 6200

## 2023-06-22 LAB — GLUCOSE, CAPILLARY: Glucose-Capillary: 107 mg/dL — ABNORMAL HIGH (ref 70–99)

## 2023-06-22 SURGERY — ESOPHAGOGASTRODUODENOSCOPY (EGD) WITH PROPOFOL
Anesthesia: Monitor Anesthesia Care

## 2023-06-22 MED ORDER — FOLIC ACID 1 MG PO TABS: 1.0000 mg | ORAL_TABLET | Freq: Every day | ORAL | 6 refills | Status: DC

## 2023-06-22 MED ORDER — OXYCODONE HCL 5 MG PO TABS
ORAL_TABLET | ORAL | Status: AC
  Filled 2023-06-22: qty 1

## 2023-06-22 MED ORDER — OXYCODONE HCL 5 MG PO TABS
5.0000 mg | ORAL_TABLET | Freq: Once | ORAL | Status: AC
  Administered 2023-06-22: 5 mg via ORAL

## 2023-06-22 MED ORDER — LIDOCAINE 2% (20 MG/ML) 5 ML SYRINGE
INTRAMUSCULAR | Status: DC | PRN
  Administered 2023-06-22: 80 mg via INTRAVENOUS

## 2023-06-22 MED ORDER — PROPOFOL 10 MG/ML IV BOLUS
INTRAVENOUS | Status: DC | PRN
  Administered 2023-06-22: 50 mg via INTRAVENOUS
  Administered 2023-06-22 (×3): 20 mg via INTRAVENOUS

## 2023-06-22 MED ORDER — MORPHINE SULFATE (PF) 2 MG/ML IV SOLN
1.0000 mg | Freq: Once | INTRAVENOUS | Status: AC
  Administered 2023-06-22: 1 mg via INTRAVENOUS
  Filled 2023-06-22: qty 1

## 2023-06-22 MED ORDER — OXYCODONE HCL 5 MG PO TABS
5.0000 mg | ORAL_TABLET | Freq: Four times a day (QID) | ORAL | Status: DC
  Administered 2023-06-22: 5 mg via ORAL
  Filled 2023-06-22: qty 1

## 2023-06-22 MED ORDER — PANTOPRAZOLE SODIUM 40 MG PO TBEC: 40.0000 mg | DELAYED_RELEASE_TABLET | Freq: Every day | ORAL | 0 refills | Status: DC

## 2023-06-22 MED ORDER — OXYCODONE HCL 5 MG PO TABS: 5.0000 mg | ORAL_TABLET | Freq: Four times a day (QID) | ORAL | Status: DC | PRN

## 2023-06-22 MED ORDER — SODIUM CHLORIDE 0.9 % IV SOLN: INTRAVENOUS | Status: DC | PRN

## 2023-06-22 MED ORDER — VILAZODONE HCL 40 MG PO TABS: 40.0000 mg | ORAL_TABLET | Freq: Every day | ORAL | 3 refills | Status: DC

## 2023-06-22 MED ORDER — POLYSACCHARIDE IRON COMPLEX 150 MG PO CAPS: 150.0000 mg | ORAL_CAPSULE | ORAL | 0 refills | Status: DC

## 2023-06-22 MED ORDER — OXYCODONE HCL 5 MG PO TABS: 5.0000 mg | ORAL_TABLET | Freq: Once | ORAL | Status: DC

## 2023-06-22 MED ORDER — PROPOFOL 500 MG/50ML IV EMUL
INTRAVENOUS | Status: DC | PRN
Start: 2023-06-22 — End: 2023-06-22
  Administered 2023-06-22: 200 ug/kg/min via INTRAVENOUS

## 2023-06-22 MED ORDER — POLYETHYLENE GLYCOL 3350 17 G PO PACK: 17.0000 g | PACK | Freq: Every day | ORAL | 0 refills | Status: DC | PRN

## 2023-06-22 SURGICAL SUPPLY — 14 items

## 2023-06-22 NOTE — Discharge Summary (Signed)
 Name: Lindsay French MRN: [id number] DOB: 1980-09-27 43 y.o. PCP: Lindsay Smart, MD  Date of Admission: 06/21/2023 12:07 AM Date of Discharge: 06/22/23 Attending Physician: Dr. Cleda French  Discharge Diagnosis: Principal Problem:   Upper GI bleed Active Problems:   HIV disease (HCC)   Morbid obesity (HCC)   Closed fracture dislocation foot with nonunion   Iron deficiency anemia due to chronic blood loss   Melena    Discharge Medications: Allergies as of 06/22/2023       Reactions   Hydrocodone Itching   Latex Other (See Comments)   As a teenager when using latex condoms she would get a yeast infection.    Mucinex Fast-max Chest Cong Ms [guaifenesin] Nausea And Vomiting   Ancef [cefazolin] Rash        Medication List     STOP taking these medications    aspirin 325 MG tablet Commonly known as: Bayer Aspirin       TAKE these medications    albuterol 108 (90 Base) MCG/ACT inhaler Commonly known as: VENTOLIN HFA INHALE 1 PUFF INTO THE LUNGS EVERY 6 HOURS AS NEEDED FOR WHEEZING OR SHORTNESS OF BREATH   ALPRAZolam 1 MG tablet Commonly known as: XANAX Take 1 tablet (1 mg total) by mouth 3 (three) times daily as needed for anxiety.   budesonide-formoterol 80-4.5 MCG/ACT inhaler Commonly known as: Symbicort Inhale 2 puffs into the lungs daily. What changed:  when to take this reasons to take this   [name] 50-300 MG tablet Generic drug: dolutegravir-lamiVUDine Take 1 tablet by mouth daily. What changed: when to take this   folic acid 1 MG tablet Commonly known as: FOLVITE Take 1 tablet (1 mg total) by mouth daily. Start taking on: June 23, 2023   gabapentin 300 MG capsule Commonly known as: Neurontin Take 2 capsules (600 mg total) by mouth 3 (three) times daily. What changed:  when to take this additional instructions   irbesartan 150 MG tablet Commonly known as: AVAPRO TAKE 1 TABLET(150 MG) BY MOUTH EVERY EVENING   iron polysaccharides 150 MG  capsule Commonly known as: Nu-Iron Take 1 capsule (150 mg total) by mouth every other day.   methocarbamol 500 MG tablet Commonly known as: ROBAXIN Take 500 mg by mouth every 6 (six) hours as needed for muscle spasms.   oxyCODONE 5 MG immediate release tablet Commonly known as: Roxicodone Take 1 tablet (5 mg total) by mouth every 4 (four) hours as needed.   pantoprazole 40 MG tablet Commonly known as: Protonix Take 1 tablet (40 mg total) by mouth daily.   polyethylene glycol 17 g packet Commonly known as: MIRALAX / GLYCOLAX Take 17 g by mouth daily as needed for moderate constipation.   Tylenol PM Extra Strength 500-25 MG Tabs tablet Generic drug: diphenhydramine-acetaminophen Take 1 tablet by mouth at bedtime as needed. What changed:  how much to take reasons to take this   Vilazodone HCl 40 MG Tabs Commonly known as: VIIBRYD Take 1 tablet (40 mg total) by mouth daily.        Disposition and follow-up:   Ms.Lindsay French was discharged from [facility] in Stable condition.  At the hospital follow up visit please address:  1.  Follow-up: Symptomatic anemia-hemoglobin at 6.5 on admission.  She was given 1 unit of blood.  Folic acid and iron deficient.  Started on supplementation.  Will need to follow-up with GI after foot heals for colonoscopy.   2.  Labs / imaging  needed at time of follow-up: CBC  3.  Pending labs/ test needing follow-up: Biopsy from EGD  4.  Medication Changes: Pantoprazole 40 mg twice daily Folic acid supplement 1 mg daily Iron supplement 150 mg every other day  Follow-up Appointments:  Dr. Ninetta French with PCP to repeat CBC Follow-up with GI  Hospital Course by problem list: Symptomatic anemia Upper GI bleed Iron and folate deficiency Patient presented to the ED 2/2 for a fall found to have a Lisfranc fracture of her foot.  During preop evaluation her hemoglobin was found to be less than 6.  She was given transfusion but no  further evaluation was completed.  She was a direct admit by her primary care doctor.  On arrival her hemoglobin was 6.5.  She was given 1 unit PRBCs and GI was consulted.  She was found to have iron deficiency and folate deficiency.  It seems like she likely had an acute bleed prior to the fall.  Question if fall was secondary to symptomatic anemia.  She completed endoscopy that showed several ulcers that were nonbleeding, no stigmata of bleeding.  Biopsies were taken to rule out H. pylori.  GI wish to pursue colonoscopy, however patient deferred as she is currently nonweightbearing and she was worried about completing bowel prep for colonoscopy.  Hemoglobin was stable following 1 unit of transfusion.  She was discharged with addition of pantoprazole 40 mg twice daily, iron and folate supplementation.  She was given IV iron prior to discharge as well.  Leukocytosis WBC 12.2 in setting of recent surgery.  No signs of infection.  Left foot midfoot fracture dislocation with Lisfranc disruption status post surgery 2/18 She had surgery on 218 with Dr. Susa French.  She needs to follow-up for splint removal, suture removal and placement of short leg cast.  I am holding aspirin for DVT prophylaxis at discharge due to acute anemia.  Will continue home regimen of gabapentin, oxycodone, and Tylenol.  HIV Well-controlled, follows with Dr. Ninetta French continue to follow at discharge  Type 2 diabetes Diabetes well-controlled with A1c at 5.1.   Hypertension Continued irbesartan 150 mg  Hyperlipidemia Continue atorvastatin 10 mg  Anxiety/depression Continue vilazodone 40 mg  History of panic attacks Home as needed Xanax 1 mg to needed at discharge   Discharge Exam:   BP (!) 146/97   Pulse 83   Temp 98.4 F (36.9 C) (Oral)   Resp 19   LMP  (LMP Unknown)   SpO2 98%  Constitutional: well-appearing, in no acute distress Cardiovascular: regular rate and rhythm, no m/r/g Pulmonary/Chest: normal work of  breathing on room air, lungs clear to auscultation bilaterally Abdominal: soft, non-tender, non-distended MSK: Left foot in cast Neurological: Alert and oriented x 3 Skin: warm and dry  Pertinent Labs, Studies, and Procedures:     Latest Ref Rng & Units 06/22/2023    3:16 AM 06/21/2023    4:33 PM 06/21/2023    8:12 AM  CBC  WBC 4.0 - 10.5 K/uL 12.2   11.7   Hemoglobin 12.0 - 15.0 g/dL 7.5  7.6  6.5   Hematocrit 36.0 - 46.0 % 24.6  25.3  21.4   Platelets 150 - 400 K/uL 413   430        Latest Ref Rng & Units 06/22/2023    8:09 AM 06/22/2023    3:16 AM 06/21/2023    8:12 AM  CMP  Glucose 70 - 99 mg/dL 621  308  657   BUN 6 -  20 mg/dL 5  5  6    Creatinine 0.44 - 1.00 mg/dL 2.44  0.10  2.72   Sodium 135 - 145 mmol/L 138  139  137   Potassium 3.5 - 5.1 mmol/L 3.4  3.6  3.7   Chloride 98 - 111 mmol/L 105  106  103   CO2 22 - 32 mmol/L 24  24  24    Calcium 8.9 - 10.3 mg/dL 9.4  9.1  8.9    Discharge Instructions: Discharge Instructions     Diet - low sodium heart healthy   Complete by: As directed    Discharge instructions   Complete by: As directed    Ms. Rennis Harding, You were admitted because your blood count was very low and we were worried you were having bleeding.  You had an endoscopy done that showed several ulcers in your stomach, but no active bleeding.  The ulcers were biopsied to make sure the cells were normal and no signs of bacterial infection causing the ulcers.  Please call internal medicine clinic on Monday to get hospital follow-up within next 1 to 2 weeks.  At that visit they should review the pathology results from the endoscopy and your blood count should be repeated.  New medications: Pantoprazole 40 mg, take 2 times daily Folic acid 1 mg, take once daily New iron 150 mg, take every other day.  May cause constipation try spacing out dosing or taking MiraLAX if so  I hope you are feeling better after the blood transfusion.  You will need to follow-up with the GI team  to get a colonoscopy done after your foot is feeling better.  Please call them next week to set up appointment.  -Dr. Chalmers Guest activity slowly   Complete by: As directed        Signed: Rudene Christians, DO 06/22/2023, 12:04 PM   Pager: [phone]

## 2023-06-22 NOTE — Progress Notes (Signed)
 HD#1 Subjective:  Patient Summary: Lindsay French is a 43 y.o. with a pertinent PMH of well-controlled HIV, hypertension, depression/anxiety, who presented with dizziness and low hemoglobin and admitted on 2/28 for upper GI bleed on HD#1.   Overnight Events: None  Patient seen at bedside this morning.  Her mom is present.  She reports that she had difficulty getting her pain medications overnight and her foot is in a lot of pain this morning.  She has not had any episodes of dark stools since getting to the hospital.  She has not noticed that she is dizzy when she gets up to go to the bathroom.  She also reports that she does not have lactose intolerance and would like her allergy list to be updated so that she can order more freely.  Pt is updated on the plan for today, and all questions and concerns are addressed.   Objective:  Vital signs in last 24 hours: Vitals:   06/21/23 1731 06/21/23 1946 06/22/23 0447 06/22/23 0913  BP: 137/80 (!) 141/82 126/80 (!) 129/91  Pulse: 84 93 86 88  Resp: 16 20 (!) 22 18  Temp: 98.8 F (37.1 C) 98.7 F (37.1 C) 97.7 F (36.5 C) 98.6 F (37 C)  TempSrc: Oral Oral Oral Oral  SpO2: 98% 100% 95% 96%   Supplemental O2: Room Air SpO2: 96 %   Physical Exam:  Constitutional: well-appearing, in no acute distress Cardiovascular: regular rate and rhythm, no m/r/g Pulmonary/Chest: normal work of breathing on room air, lungs clear to auscultation bilaterally Abdominal: soft, non-tender, non-distended MSK: Left foot in cast Neurological: Alert and oriented x 3 Skin: warm and dry    Intake/Output Summary (Last 24 hours) at 06/22/2023 0937 Last data filed at 06/22/2023 0300 Gross per 24 hour  Intake 425.24 ml  Output --  Net 425.24 ml   Net IO Since Admission: 425.24 mL [06/22/23 0937]  Pertinent Labs:    Latest Ref Rng & Units 06/22/2023    3:16 AM 06/21/2023    4:33 PM 06/21/2023    8:12 AM  CBC  WBC 4.0 - 10.5 K/uL 12.2   11.7   Hemoglobin  12.0 - 15.0 g/dL 7.5  7.6  6.5   Hematocrit 36.0 - 46.0 % 24.6  25.3  21.4   Platelets 150 - 400 K/uL 413   430        Latest Ref Rng & Units 06/22/2023    3:16 AM 06/21/2023    8:12 AM 06/19/2023   11:32 AM  CMP  Glucose 70 - 99 mg/dL 811  914  782   BUN 6 - 20 mg/dL 5  6  6    Creatinine 0.44 - 1.00 mg/dL 9.56  2.13  0.86   Sodium 135 - 145 mmol/L 139  137  139   Potassium 3.5 - 5.1 mmol/L 3.6  3.7  4.3   Chloride 98 - 111 mmol/L 106  103  100   CO2 22 - 32 mmol/L 24  24  18    Calcium 8.9 - 10.3 mg/dL 9.1  8.9  9.5   Total Protein 6.0 - 8.5 g/dL   6.3   Total Bilirubin 0.0 - 1.2 mg/dL   0.4   Alkaline Phos 44 - 121 IU/L   90   AST 0 - 40 IU/L   22   ALT 0 - 32 IU/L   12     Assessment/Plan:   Principal Problem:   Upper GI bleed Active Problems:  HIV disease (HCC)   Morbid obesity (HCC)   Closed fracture dislocation foot with nonunion   Iron deficiency anemia due to chronic blood loss   Melena   Patient Summary: Lindsay French is a 43 y.o. with a pertinent PMH of well-controlled HIV, hypertension, depression/anxiety, who presented with dizziness and low hemoglobin and admitted on 2/28 for upper GI bleed on HD#1.   Upper GI bleed Iron deficiency and folate anemia Received 1 unit PRBCs 2/28 with adequate response in hemoglobin.  Hemoglobin stable at 7.5 this morning. For anemia, she has both iron deficiency and folate deficiency.  Iron supplement started with IV doses while admitted per GI team.  Folate supplementation also started.  GI planning for EGD today at 10 AM.  They also wanted to complete colonoscopy, however patient was concerned about bowel prep with her being nonweightbearing from her recent surgery.  Patient elected to delay colonoscopy until she had time to recover.  -N.p.o. until after EGD -Follow-up GI recommendations appreciate their assistance in this case -IV PPI twice daily -Folic acid 1 mg daily -Trend BMP and CBC  Leukocytosis WBC of 12.2.  She  denies dysuria, congestion, cough, fever.  Mildly elevated leukocytosis likely in setting of recent surgery. -CBC  Left foot midfoot fracture dislocation with the swelling dysfunction status post surgery She is nonweightbearing after surgery on 2/18.  Home regimen includes gabapentin, oxycodone and Tylenol.  -Holding aspirin with GI bleed -Continue multimodal pain control: Updated pain medications to be scheduled 5 mg every 6 hours with as needed if additional tablet needed -Nonweightbearing -Keep splint dry  HIV Well-controlled HIV.  Follows with Dr. Ninetta Lights.  Most recent viral load less than 20.  Home regimen includes [name] which she is adherent with. -Continue to follow  Chronic stable conditions: Hyperlipidemia-continue atorvastatin 10 mg Type 2 diabetes-SSI Hypertension-irbesartan 150 mg Anxiety/depression-continue Vilazodone 40mg   Diet: NPO IVF: None,None VTE: None Code: Full PT/OT recs: None, none. Family Update: mother updated at bedside   Dispo: Anticipated discharge to Home pending EGD due determine source of bleeding.  Melanny Wire M. Gurfateh Mcclain, D.O.  Internal Medicine Resident, PGY-3 [facility]  Pager: [phone] 9:37 AM, 06/22/2023   **Please contact the on call pager after 5 pm and on weekends at [phone].**

## 2023-06-22 NOTE — Transfer of Care (Signed)
 Immediate Anesthesia Transfer of Care Note  Patient: Lindsay French  Procedure(s) Performed: ESOPHAGOGASTRODUODENOSCOPY (EGD) WITH PROPOFOL BIOPSY  Patient Location: PACU  Anesthesia Type:MAC  Level of Consciousness: awake, alert , and oriented  Airway & Oxygen Therapy: Patient Spontanous Breathing  Post-op Assessment: Report given to RN and Post -op Vital signs reviewed and stable  Post vital signs: Reviewed and stable  Last Vitals:  Vitals Value Taken Time  BP    Temp    Pulse 94 06/22/23 1029  Resp 19 06/22/23 1029  SpO2 95 % 06/22/23 1029  Vitals shown include unfiled device data.  Last Pain:  Vitals:   06/22/23 0947  TempSrc: Temporal  PainSc: 5          Complications: No notable events documented.

## 2023-06-22 NOTE — Interval H&P Note (Signed)
 History and Physical Interval Note:  06/22/2023 9:30 AM  Lindsay French  has presented today for surgery, with the diagnosis of anemia, dark stool, iron deficiency.  The various methods of treatment have been discussed with the patient and family. After consideration of risks, benefits and other options for treatment, the patient has consented to  Procedure(s): ESOPHAGOGASTRODUODENOSCOPY (EGD) WITH PROPOFOL (N/A) as a surgical intervention.  [name] history has been reviewed, patient examined, no change in status, stable for surgery.  I have reviewed the patient's chart and labs.  Questions were answered to the patient's satisfaction.     Purcell Jungbluth

## 2023-06-22 NOTE — Plan of Care (Signed)

## 2023-06-22 NOTE — Anesthesia Preprocedure Evaluation (Signed)
 Anesthesia Evaluation  Patient identified by MRN, date of birth, ID band Patient awake    Reviewed: Allergy & Precautions, NPO status , Patient's Chart, lab work & pertinent test results  Airway Mallampati: III  TM Distance: >3 FB Neck ROM: Full    Dental no notable dental hx.    Pulmonary asthma , former smoker   Pulmonary exam normal        Cardiovascular hypertension, Pt. on medications Normal cardiovascular exam     Neuro/Psych  PSYCHIATRIC DISORDERS Anxiety Depression    negative neurological ROS     GI/Hepatic ,GERD  ,,(+)     substance abuse    Endo/Other  diabetesHypothyroidism  Class 3 obesityPatient on GLP-1 Agonist  Renal/GU negative Renal ROS     Musculoskeletal negative musculoskeletal ROS (+)    Abdominal  (+) + obese  Peds  Hematology  (+) Blood dyscrasia, anemia , HIV  Anesthesia Other Findings LEFT FOOT MIDFOOT FRACTURE DISLOCATION WITH LISFRANC DISRUPTION  Reproductive/Obstetrics Hcg negative                             Anesthesia Physical Anesthesia Plan  ASA: 3  Anesthesia Plan: MAC   Post-op Pain Management:    Induction:   PONV Risk Score and Plan: 3 and Ondansetron, Dexamethasone and Treatment may vary due to age or medical condition  Airway Management Planned: Nasal CPAP and Natural Airway  Additional Equipment:   Intra-op Plan:   Post-operative Plan:   Informed Consent: I have reviewed the patients History and Physical, chart, labs and discussed the procedure including the risks, benefits and alternatives for the proposed anesthesia with the patient or authorized representative who has indicated his/her understanding and acceptance.     Dental advisory given  Plan Discussed with: CRNA  Anesthesia Plan Comments:        Anesthesia Quick Evaluation

## 2023-06-22 NOTE — Anesthesia Postprocedure Evaluation (Signed)
 Anesthesia Post Note  Patient: Lindsay French  Procedure(s) Performed: ESOPHAGOGASTRODUODENOSCOPY (EGD) WITH PROPOFOL BIOPSY     Patient location during evaluation: PACU Anesthesia Type: MAC Level of consciousness: awake and alert Pain management: pain level controlled Vital Signs Assessment: post-procedure vital signs reviewed and stable Respiratory status: spontaneous breathing, nonlabored ventilation, respiratory function stable and patient connected to nasal cannula oxygen Cardiovascular status: stable and blood pressure returned to baseline Postop Assessment: no apparent nausea or vomiting Anesthetic complications: no  No notable events documented.  Last Vitals:  Vitals:   06/22/23 1058 06/22/23 1100  BP: (!) 155/86 (!) 146/97  Pulse: 83 83  Resp: 14 19  Temp:    SpO2: 95% 98%    Last Pain:  Vitals:   06/22/23 1115  TempSrc:   PainSc: 6                  Kennieth Rad

## 2023-06-22 NOTE — Op Note (Addendum)
 [facility] Patient Name: Lindsay French Procedure Date : 06/22/2023 MRN: [id number] Attending MD: Napoleon Form , MD, [id number] Date of Birth: 23-Jan-1981 CSN: [id number] Age: 43 Admit Type: Inpatient Procedure:                Upper GI endoscopy Indications:              Suspected upper gastrointestinal bleeding, Melena Providers:                Napoleon Form, MD, Eliberto Ivory, RN, Rhodia Albright, Technician Referring MD:             Lacretia Leigh. Ninetta Lights MD, MD Medicines:                Monitored Anesthesia Care Complications:            No immediate complications. Estimated Blood Loss:     Estimated blood loss was minimal. Procedure:                Pre-Anesthesia Assessment:                           - Prior to the procedure, a History and Physical                            was performed, and patient medications and                            allergies were reviewed. [name] tolerance of                            previous anesthesia was also reviewed. The risks                            and benefits of the procedure and the sedation                            options and risks were discussed with the patient.                            All questions were answered, and informed consent                            was obtained. Prior Anticoagulants: The patient has                            taken no anticoagulant or antiplatelet agents. ASA                            Grade Assessment: II - A patient with mild systemic                            disease. After reviewing the risks and benefits,  the patient was deemed in satisfactory condition to                            undergo the procedure.                           After obtaining informed consent, the endoscope was                            passed under direct vision. Throughout the                            procedure, the patient's blood pressure,  pulse, and                            oxygen saturations were monitored continuously. The                            [id number]-[id number] ([id number]) Olympus endoscope was introduced                            through the mouth, and advanced to the second part                            of duodenum. The upper GI endoscopy was                            accomplished without difficulty. The patient                            tolerated the procedure well. Scope In: Scope Out: Findings:      The Z-line was regular and was found 38 cm from the incisors.      No gross lesions were noted in the entire esophagus.      Few non-bleeding cratered gastric ulcers with no stigmata of bleeding       were found at the incisura. The largest lesion was 10 mm in largest       dimension. Biopsies were taken with a cold forceps for Helicobacter       pylori testing.      Patchy mild inflammation characterized by congestion (edema), erosions,       erythema and friability was found in the entire examined stomach.       Biopsies were taken with a cold forceps for Helicobacter pylori testing.      A few erosions without bleeding were found in the duodenal bulb.      The second portion of the duodenum was normal. Impression:               - Z-line regular, 38 cm from the incisors.                           - No gross lesions in the entire esophagus.                           - Non-bleeding gastric ulcers with no stigmata of  bleeding. Biopsied.                           - Gastritis. Biopsied.                           - Duodenal erosions without bleeding.                           - Normal second portion of the duodenum. Recommendation:           - Patient has a contact number available for                            emergencies. The signs and symptoms of potential                            delayed complications were discussed with the                            patient. Return to normal  activities tomorrow.                            Written discharge instructions were provided to the                            patient.                           - Resume previous diet.                           - Continue present medications.                           - Pantoprazole 40mg  BID                           - Await pathology results.                           - No ibuprofen, naproxen, or other non-steroidal                            anti-inflammatory drugs.                           - Inpatient GI will sign off, will arrange for                            outpatient follow up with Dr Myrtie Neither (next available                            appointment) to be seen in GI office for iron                            deficiency anemia pre existing prior to this acute  GI bleed episode, will benefit to undergo                            colonoscopy Procedure Code(s):        --- Professional ---                           [phone], Esophagogastroduodenoscopy, flexible,                            transoral; with biopsy, single or multiple Diagnosis Code(s):        --- Professional ---                           K25.9, Gastric ulcer, unspecified as acute or                            chronic, without hemorrhage or perforation                           K29.70, Gastritis, unspecified, without bleeding                           K26.9, Duodenal ulcer, unspecified as acute or                            chronic, without hemorrhage or perforation                           K92.1, Melena (includes Hematochezia) CPT copyright 2022 [employer] Association. All rights reserved. The codes documented in this report are preliminary and upon coder review may  be revised to meet current compliance requirements. Napoleon Form, MD 06/22/2023 10:39:49 AM This report has been signed electronically. Number of Addenda: 0

## 2023-06-24 ENCOUNTER — Encounter: Payer: Medicaid Other | Admitting: Student

## 2023-06-24 ENCOUNTER — Encounter (HOSPITAL_COMMUNITY): Payer: Self-pay | Admitting: Gastroenterology

## 2023-06-25 LAB — SURGICAL PATHOLOGY

## 2023-06-30 ENCOUNTER — Encounter: Payer: Self-pay | Admitting: Infectious Diseases

## 2023-06-30 DIAGNOSIS — E1143 Type 2 diabetes mellitus with diabetic autonomic (poly)neuropathy: Secondary | ICD-10-CM

## 2023-06-30 MED ORDER — GABAPENTIN 300 MG PO CAPS: 600.0000 mg | ORAL_CAPSULE | Freq: Three times a day (TID) | ORAL | 3 refills | Status: DC

## 2023-07-09 ENCOUNTER — Other Ambulatory Visit

## 2023-07-09 ENCOUNTER — Other Ambulatory Visit: Payer: Self-pay | Admitting: Internal Medicine

## 2023-07-09 ENCOUNTER — Encounter: Payer: Self-pay | Admitting: Infectious Diseases

## 2023-07-09 DIAGNOSIS — K922 Gastrointestinal hemorrhage, unspecified: Secondary | ICD-10-CM

## 2023-07-09 DIAGNOSIS — Z113 Encounter for screening for infections with a predominantly sexual mode of transmission: Secondary | ICD-10-CM

## 2023-07-09 DIAGNOSIS — E1143 Type 2 diabetes mellitus with diabetic autonomic (poly)neuropathy: Secondary | ICD-10-CM

## 2023-07-09 DIAGNOSIS — B20 Human immunodeficiency virus [HIV] disease: Secondary | ICD-10-CM

## 2023-07-09 NOTE — Progress Notes (Signed)
 CBC ordered for follow-up with recent hospital admission.

## 2023-07-09 NOTE — Addendum Note (Signed)
 Addended by: Lucille Passy on: 07/09/2023 11:40 AM   Modules accepted: Orders

## 2023-07-09 NOTE — Addendum Note (Signed)
 Addended by: Lucille Passy on: 07/09/2023 03:53 PM   Modules accepted: Orders

## 2023-07-09 NOTE — Addendum Note (Signed)
 Addended by: Lucille Passy on: 07/09/2023 01:01 PM   Modules accepted: Orders

## 2023-07-09 NOTE — Addendum Note (Signed)
 Addended by: Lucille Passy on: 07/09/2023 03:58 PM   Modules accepted: Orders

## 2023-07-09 NOTE — Progress Notes (Deleted)
 Lipid, RPR, CD4, HIV RNA, CMP, urine G/C and CBC ordered for lab only visit this afternoon.  Last A1c 2/25 Lab Results  Component Value Date   HGBA1C 5.1 06/21/2023

## 2023-07-10 LAB — CBC
Hematocrit: 30.4 % — ABNORMAL LOW (ref 34.0–46.6)
Hemoglobin: 9.3 g/dL — ABNORMAL LOW (ref 11.1–15.9)
MCH: 26.5 pg — ABNORMAL LOW (ref 26.6–33.0)
MCHC: 30.6 g/dL — ABNORMAL LOW (ref 31.5–35.7)
MCV: 87 fL (ref 79–97)
Platelets: 486 10*3/uL — ABNORMAL HIGH (ref 150–450)
RBC: 3.51 x10E6/uL — ABNORMAL LOW (ref 3.77–5.28)
RDW: 18.5 % — ABNORMAL HIGH (ref 11.7–15.4)
WBC: 10.3 10*3/uL (ref 3.4–10.8)

## 2023-07-17 ENCOUNTER — Other Ambulatory Visit: Payer: Self-pay | Admitting: Internal Medicine

## 2023-07-17 ENCOUNTER — Encounter: Admitting: Infectious Diseases

## 2023-07-22 ENCOUNTER — Other Ambulatory Visit: Payer: Self-pay | Admitting: Infectious Diseases

## 2023-07-22 DIAGNOSIS — E1169 Type 2 diabetes mellitus with other specified complication: Secondary | ICD-10-CM

## 2023-07-22 DIAGNOSIS — E66813 Obesity, class 3: Secondary | ICD-10-CM

## 2023-07-22 DIAGNOSIS — E1165 Type 2 diabetes mellitus with hyperglycemia: Secondary | ICD-10-CM

## 2023-07-22 MED ORDER — SEMAGLUTIDE(0.25 OR 0.5MG/DOS) 2 MG/3ML ~~LOC~~ SOPN: PEN_INJECTOR | SUBCUTANEOUS | 3 refills | Status: DC

## 2023-07-23 ENCOUNTER — Encounter: Admitting: Infectious Diseases

## 2023-07-25 ENCOUNTER — Other Ambulatory Visit: Payer: Self-pay | Admitting: Internal Medicine

## 2023-07-25 NOTE — Telephone Encounter (Signed)
 Medication sent to pharmacy

## 2023-07-30 ENCOUNTER — Ambulatory Visit: Admitting: Infectious Diseases

## 2023-07-30 VITALS — BP 151/97 | HR 87 | Temp 98.2°F | Ht 62.0 in | Wt 282.3 lb

## 2023-07-30 DIAGNOSIS — S92902K Unspecified fracture of left foot, subsequent encounter for fracture with nonunion: Secondary | ICD-10-CM | POA: Diagnosis not present

## 2023-07-30 DIAGNOSIS — Z6841 Body Mass Index (BMI) 40.0 and over, adult: Secondary | ICD-10-CM | POA: Diagnosis not present

## 2023-07-30 DIAGNOSIS — F909 Attention-deficit hyperactivity disorder, unspecified type: Secondary | ICD-10-CM | POA: Diagnosis not present

## 2023-07-30 DIAGNOSIS — E1165 Type 2 diabetes mellitus with hyperglycemia: Secondary | ICD-10-CM | POA: Diagnosis not present

## 2023-07-30 DIAGNOSIS — N979 Female infertility, unspecified: Secondary | ICD-10-CM

## 2023-07-30 DIAGNOSIS — Z7985 Long-term (current) use of injectable non-insulin antidiabetic drugs: Secondary | ICD-10-CM | POA: Diagnosis not present

## 2023-07-30 DIAGNOSIS — D500 Iron deficiency anemia secondary to blood loss (chronic): Secondary | ICD-10-CM

## 2023-07-30 DIAGNOSIS — K922 Gastrointestinal hemorrhage, unspecified: Secondary | ICD-10-CM

## 2023-07-30 DIAGNOSIS — B20 Human immunodeficiency virus [HIV] disease: Secondary | ICD-10-CM | POA: Diagnosis not present

## 2023-07-30 DIAGNOSIS — I152 Hypertension secondary to endocrine disorders: Secondary | ICD-10-CM

## 2023-07-30 MED ORDER — IRBESARTAN 150 MG PO TABS: 150.0000 mg | ORAL_TABLET | Freq: Every day | ORAL | 3 refills | Status: DC

## 2023-07-30 MED ORDER — IRON (FERROUS SULFATE) 325 (65 FE) MG PO TABS: 1.0000 | ORAL_TABLET | Freq: Every day | ORAL | 3 refills | Status: DC

## 2023-07-30 NOTE — Assessment & Plan Note (Addendum)
 Continues on ozempeic when able to get her insurance restarted. .  She attributes her hair loss to this.

## 2023-07-30 NOTE — Assessment & Plan Note (Signed)
 She's not clear when she had her last pap.  Told she doesn't need yearly.  She doesn't like getting them.

## 2023-07-30 NOTE — Assessment & Plan Note (Signed)
 Denies melena, brbpr.  Denies dizziness.  Will recheck her h/h today Encourage her t f/u with GI for colon.

## 2023-07-30 NOTE — Progress Notes (Signed)
 Subjective:    Patient ID: Lindsay French, female  DOB: 07/16/1980, 43 y.o.        MRN: [id number]   HPI 43 yo HIV+ (dx 2008) prev on atripla -->truvada/prezcobix ---> complera--> odefsy (due to lipodystrophy)--> dovato. Breathing is good except for DOE.  She texted me over the weekend that she was feeling poorly, that her Glc were irregular and she was concerned that she was going to lose her ozempiec. Her mother helps her get this (sometimes using her rx).  I suggesed we get her in to see one of the [facility] docs for her sugar, she states she only wanted to see me.    She  had fall on May 26, 2023 and broke her L foot. She was seen in ED and f/u with ortho. She was taking advil, tylenol and oxycodone for pain and developed dark stools. She was advised to stop the advil.  She had surgery 2-18 and pre-op her Hgb was 5.8. She was transfused and underwent repair of the 4 broken bones. She was d/c with asa for DVT prophylaxis, motrin, oxycodone.  She texted me on 2-19 and told me she needed to f/u with heme.  She did not want to leave her house for repeat h/h due to her recent surgery and I attempted to get home health to draw her blood. We did get her to come in on 2-26 and her repeat hgb was 7.  She was direct admitted and was transfused 1 u prbc.  She underwent upper GI and was found to have several ulcers without bleeding. She deferred colonoscopy.  She had f/u h/h 3-18 [9.3/30.4]  Just got cast off on 4-4 and is now in a walking boot (which she can't walk in). This has increased her pain and decreased her ability to bear weight.  Needs different iron pill that her insurance will cover.  Has not f/u with endo- needs new referral.  Has not had episodes of dizziness but also not able to get up from bed.  Wt up !0# off ozempeic.   Has court appt regarding injury at her daycare upcoming.   HIV 1 RNA Quant (Copies/mL)  Date Value  06/26/2021 23 (H)  08/30/2020 <20 (H)  03/10/2020 <20   HIV-1  RNA Viral Load (copies/mL)  Date Value  06/19/2023 <20  02/27/2022 <20   CD4 T Cell Abs (/uL)  Date Value  06/19/2023 1,067  02/27/2022 963  08/30/2020 664     Health Maintenance  Topic Date Due   FOOT EXAM  Never done   OPHTHALMOLOGY EXAM  Never done   Diabetic kidney evaluation - Urine ACR  Never done   Pneumococcal Vaccine 86-34 Years old (2 of 2 - PCV) 11/14/2011   Cervical Cancer Screening (HPV/Pap Cotest)  12/21/2014   COVID-19 Vaccine (3 - Moderna risk series) 12/19/2019   INFLUENZA VACCINE  11/22/2023   HEMOGLOBIN A1C  12/19/2023   Diabetic kidney evaluation - eGFR measurement  06/21/2024   DTaP/Tdap/Td (2 - Td or Tdap) 05/25/2033   Hepatitis C Screening  Completed   HIV Screening  Completed   HPV VACCINES  Aged Out      Review of Systems  Constitutional:  Negative for chills, fever and weight loss.    Please see HPI. All other systems reviewed and negative.     Objective:  Physical Exam Vitals reviewed.  Constitutional:      Appearance: Normal appearance. She is obese.  HENT:  Mouth/Throat:     Mouth: Mucous membranes are moist.     Pharynx: No oropharyngeal exudate.  Eyes:     Extraocular Movements: Extraocular movements intact.     Pupils: Pupils are equal, round, and reactive to light.  Cardiovascular:     Rate and Rhythm: Normal rate and regular rhythm.  Pulmonary:     Effort: Pulmonary effort is normal.     Breath sounds: Normal breath sounds.  Abdominal:     General: Bowel sounds are normal. There is no distension.     Palpations: Abdomen is soft.     Tenderness: There is no abdominal tenderness.  Musculoskeletal:     Cervical back: Normal range of motion and neck supple.     Comments: LLE in cast  Neurological:     General: No focal deficit present.     Mental Status: She is alert.            Assessment & Plan:

## 2023-07-30 NOTE — Assessment & Plan Note (Signed)
 She is back on ozempeic.  Will f/u her wt.

## 2023-07-30 NOTE — Assessment & Plan Note (Addendum)
 Worsened now that she is alone at night, can't work and can't go out.  Having crying episodes- she is not clear that this is related to her infertility/hormone imbalances. She does not like her psychiatrist, wants new rx (not adderal). She will try to connect with her again.  After her initial injury issue at her daycare, she had worsened panic attacks and had frequent n/v.  Needs letter from her psychiatrist to appeal.  Will also refer to behavioral health in [facility].

## 2023-07-30 NOTE — Assessment & Plan Note (Addendum)
 Will continue f/u with ortho.  Next appt 09-06-23.  Physical therapy to start soon.

## 2023-07-30 NOTE — Assessment & Plan Note (Signed)
Refilled her iron.

## 2023-07-30 NOTE — Assessment & Plan Note (Signed)
 She appears to be doing well.  Continue dovato.  Rtc in 6 months

## 2023-07-31 LAB — CBC
Hematocrit: 31.1 % — ABNORMAL LOW (ref 34.0–46.6)
Hemoglobin: 9.2 g/dL — ABNORMAL LOW (ref 11.1–15.9)
MCH: 24.3 pg — ABNORMAL LOW (ref 26.6–33.0)
MCHC: 29.6 g/dL — ABNORMAL LOW (ref 31.5–35.7)
MCV: 82 fL (ref 79–97)
Platelets: 229 10*3/uL (ref 150–450)
RBC: 3.79 x10E6/uL (ref 3.77–5.28)
RDW: 18.5 % — ABNORMAL HIGH (ref 11.7–15.4)
WBC: 8.9 10*3/uL (ref 3.4–10.8)

## 2023-08-05 ENCOUNTER — Telehealth: Payer: Self-pay | Admitting: Infectious Diseases

## 2023-08-05 NOTE — Telephone Encounter (Signed)
 Referral has been re-routed to [facility] [place] Locations per Ins.  Copied from CRM [id number]. Topic: Referral - Status >> Aug 02, 2023 12:39 PM Brynn Caras wrote: Reason for CRM: Pattie Borders from [facility] states the patient's referral to see Dr. Godwin Lat an Endocrinologist, can't be authorized due to them being out-of-network with [facility].

## 2023-08-12 NOTE — Therapy (Addendum)
 OUTPATIENT PHYSICAL THERAPY LOWER EXTREMITY EVALUATION + NO VISIT DISCHARGE SUMMARY (see below)    Patient Name: Lindsay French MRN: [id number] DOB:Mar 14, 1981, 43 y.o., female Today's Date: 08/13/2023  END OF SESSION:  PT End of Session - 08/13/23 1217     Visit Number 1    Number of Visits 17    Date for PT Re-Evaluation 10/08/23    Authorization Type [employer]    Authorization Time Period auth tbd    PT Start Time 1217    PT Stop Time 1301    PT Time Calculation (min) 44 min    Activity Tolerance Patient tolerated treatment well             Past Medical History:  Diagnosis Date   ADHD    Anxiety    Asthma    usually with colds   Avascular necrosis (HCC)    Back pain    Depression    Diabetes mellitus without complication (HCC)    Elevated LFTs    Fatty liver 04/16/2016   Noted US  ABD   GERD (gastroesophageal reflux disease)    Heart murmur    at birth, no issues   History of heat stroke    with syncope   History of thrombocytopenia    prior to diagnosis HIV   HIV (human immunodeficiency virus infection) (HCC)    Hyperlipidemia    Hypertension    Hypothyroidism    Infertility, female    Obese    PCOS (polycystic ovarian syndrome)    Pneumonia 04/17/2016   SOB (shortness of breath)    Wears partial dentures    upper   Wrist fracture 11/2017   Left   Past Surgical History:  Procedure Laterality Date   BIOPSY  06/22/2023   Procedure: BIOPSY;  Surgeon: Shila Gustav GAILS, MD;  Location: [facility];  Service: Gastroenterology;;   ESOPHAGOGASTRODUODENOSCOPY (EGD) WITH PROPOFOL  N/A 06/22/2023   Procedure: ESOPHAGOGASTRODUODENOSCOPY (EGD) WITH PROPOFOL ;  Surgeon: Shila Gustav GAILS, MD;  Location: [facility];  Service: Gastroenterology;  Laterality: N/A;   HYSTEROSCOPY N/A 12/31/2017   Procedure: HYSTEROSCOPY and polypectomy;  Surgeon: Yalcinkaya, Tamer, MD;  Location: [facility];  Service: Gynecology;  Laterality: N/A;   I & D EXTREMITY  Left 07/15/2020   Procedure: PARTIAL EXCISION LEFT TIBIA;  Surgeon: Harden Jerona GAILS, MD;  Location: [facility] OR;  Service: Orthopedics;  Laterality: Left;   IR FLUORO GUIDE CV LINE LEFT  09/26/2020   IR RADIOLOGIST EVAL & MGMT  08/12/2020   maxillofacial surgery     OPEN REDUCTION INTERNAL FIXATION (ORIF) FOOT LISFRANC FRACTURE Left 06/11/2023   Procedure: OPEN TREAMENT OF LEFT FIRST, SECOND, FOURTH METATARSALS, MEDIAL CUNEIFORM FRACTURE, CUBOID, FIRST SECOND THRID TARSOMETATARSAL AND LISFRANC JOINT;  Surgeon: Elsa Lonni SAUNDERS, MD;  Location: [facility] OR;  Service: Orthopedics;  Laterality: Left;   Patient Active Problem List   Diagnosis Date Noted   Acute gastric ulcer with hemorrhage 06/22/2023   Gastritis and gastroduodenitis 06/22/2023   Upper GI bleed 06/21/2023   Morbid obesity (HCC) 06/21/2023   Closed fracture dislocation foot with nonunion 06/21/2023   Iron  deficiency anemia due to chronic blood loss 06/21/2023   Melena 06/21/2023   Lisfranc dislocation, left, initial encounter 05/26/2023   Diabetic foot infection (HCC) 04/30/2023   Insomnia 04/30/2023   Thrush 06/12/2022   Depression with anxiety 01/29/2022   At risk for heart disease 09/20/2021   Folate deficiency 08/25/2021   Vitamin D  deficiency 08/25/2021   Hyperlipidemia associated with  type 2 diabetes mellitus (HCC) 08/25/2021   Type 2 diabetes mellitus with hyperglycemia, without long-term current use of insulin  (HCC) 08/25/2021   Hypertension associated with type 2 diabetes mellitus (HCC) 08/25/2021   At risk for hyperglycemia 08/25/2021   Dental infection 10/21/2020   Left upper extremity swelling 10/21/2020   Intractable nausea and vomiting 10/12/2020   Chronic narcotic use 08/30/2020   Arthralgia of left lower leg    Cellulitis of left leg    Severe protein-calorie malnutrition (HCC)    Hypothyroidism    GERD (gastroesophageal reflux disease)    Hepatic steatosis    Thrombocytosis    Macrocytic anemia    Back pain  03/24/2019   Infertility, female 03/24/2019   PCOS (polycystic ovarian syndrome) 03/24/2019   HIV disease (HCC) 04/24/2018   Health care maintenance 04/24/2018   Hyperlipidemia 04/10/2017   Acute respiratory failure with hypoxia (HCC) 04/16/2016   Acute bronchitis 04/16/2016   Hypertension 04/16/2016   Class 3 obesity 03/21/2016   Screening examination for venereal disease 03/30/2014   Encounter for long-term (current) use of medications 03/30/2014   Idiopathic thrombocytopenic purpura (ITP) (HCC) 11/14/2010   ADHD (attention deficit hyperactivity disorder) 11/14/2010    PCP: Eben Reyes BROCKS, MD  REFERRING PROVIDER: Elsa Lonni SAUNDERS, MD  REFERRING DIAG: S/P LEFT LISFRANC NWB IN BOOT UNTIL 8 WEES POST OP , THEN SLOWLY INCREASE TO WBAT IN BOOT  THERAPY DIAG:  Pain in left ankle and joints of left foot  Other abnormalities of gait and mobility  Rationale for Evaluation and Treatment: Rehabilitation  ONSET DATE: Injury February 2nd, DOS 06/11/23 lisfranc ORIF  SUBJECTIVE:   SUBJECTIVE STATEMENT: Pt states she injured her foot in a fall back in February. ORIF on June 11 2023. Started putting weight through her foot (in boot) a couple days ago and has been using rollator. Still using electric scooter for most of her mobility. Sleeping in lift chair w/ foot elevated. Pt states she was initially NWB in splint, then moved to a cast. At 8 weeks was put in a boot. States 8 week follow up went well and XR looked good.  Dorsal foot and ankle pain, some dorsal numbness since surgery.  PERTINENT HISTORY: ADHD, anxiety, asthma, AVN, depression, DM, HIV, HTN, hypothyroidism, PCOS  PAIN:  Are you having pain: 3/10 Location/description: L foot, dorsal Best-worst over past week: 1-6/10  - aggravating factors: WB, dependent positioning  - Easing factors: elevation, meds  PRECAUTIONS: fall risk, HIV, WB precautions    WEIGHT BEARING RESTRICTIONS: Yes NWB IN BOOT UNTIL 8 WEES  POST OP , THEN SLOWLY INCREASE TO WBAT IN BOOT 8 weeks post op: 08/06/23  FALLS:  Has patient fallen in last 6 months? Yes. Number of falls 1 major fall, precipitating episode - reports prior fall history as well  LIVING ENVIRONMENT: Lives with husband who has a back injury - pt typically does cooking/housework Has an Art gallery manager, BSC, rollator, has a two wheeled walker.   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  DIAGNOSTIC FINDINGS:  DOS L lisfranc ORIF 06/11/23 (see op note for details)  PATIENT SURVEYS:  LEFS 11/80  COGNITION: Overall cognitive status: Within functional limits for tasks assessed     SENSATION: Reports dorsal numbness on surgical foot   EDEMA:  Mild  edema apparent about L ankle, no apparent calf swelling. Pt wearing compression socks   LOWER EXTREMITY ROM:  ROM Right eval Left eval  Knee flexion    Knee extension    Ankle dorsiflexion  5 deg   Ankle plantarflexion  32 deg *  Ankle inversion  18 deg   Ankle eversion  10deg *    (Blank rows = not tested) (Key: WFL = within functional limits not formally assessed, * = concordant pain, s = stiffness/stretching sensation, NT = not tested) Comments:   LOWER EXTREMITY MMT:  MMT Right eval Left eval  Knee flexion    Knee extension    Ankle dorsiflexion    Ankle plantarflexion    Ankle inversion    Ankle eversion     (Blank rows = not tested) (Key: WFL = within functional limits not formally assessed, * = concordant pain, s = stiffness/stretching sensation, NT = not tested) Comment:   FUNCTIONAL TESTS:  Deferred on eval  given time constraints and increased time w/ education  GAIT: Distance walked: within clinic Assistive device utilized: rollator Level of assistance: Modified  independence Comments: mild antalgic gait, step to pattern w/ LLE, boot donned                                                                                                                                 TREATMENT DATE:  [facility] Adult PT Treatment:                                                DATE: 08/13/23 Therapeutic Exercise: Double ER red band x8 Bicep curl x8 red band Tricep pull x8 red band Ankle pumps x8 Cues/education for appropriate setup, comfortable ROM and positioning, HEP handout, rationale for interventions  Self Care: Significant time spent w/ education/discussion re: post op restrictions, gradual WB progression using boot per physician referral, education on monitoring symptom response and appropriate modification as needed, pros/cons of rollator vs RW, ice/elevation     PATIENT EDUCATION:  Education details: Pt education on PT impairments, prognosis, and POC. Informed consent. Rationale for interventions, safe/appropriate HEP performance, self care as above Person educated: Patient Education method: Explanation, Demonstration, Tactile cues, Verbal cues Education comprehension: verbalized understanding, returned demonstration, verbal cues required, tactile cues required, and needs further education    HOME EXERCISE PROGRAM: Access Code: [email or web address] URL: [email or web address] Date: 08/13/2023 Prepared by: Alm Jenny  Exercises - Supine Ankle Pumps  - 2-3 x daily - 1 sets - 8-12 reps - Shoulder External Rotation and Scapular Retraction with Resistance  - 3-4 x weekly - 2-3 sets - 8 reps - Seated Bicep Curls Supinated with Dumbbells  - 3-4 x weekly - 2-3 sets - 8 reps - Standing Elbow Extension with Self-Anchored Resistance  - 3-4 x weekly - 2-3  sets - 8 reps  ASSESSMENT:  CLINICAL IMPRESSION: Patient is a pleasant 43 y.o. woman who was seen today for physical therapy evaluation and treatment for L lisfranc ORIF DOS 06/11/23, 9 weeks post op on  08/13/23. Per referral okay to slowly increase to WBAT in boot, which pt states she has been doing past few days using rollator. She endorses significant decline in functional mobility and activity tolerance since her surgery, using electric scooter as primary means of mobility. Today she demonstrates mild ankle mobility deficits, altered kinematics as expected post op. Mild transient discomfort with ROM exam and WB in boot as expected but resolves quickly with cessation of task. Significant time spent w/ education as above, emphasis on safety w/ mobility and monitoring response to her WB. No adverse events. Pt notes generalized weakness since her activity levels have declined, prescription of UE exercises per pt request to address this with aim of maximizing safety/tolerance w/ RW use for mobility. Recommend skilled PT to address aforementioned deficits with aim of improving functional tolerance and reducing pain with typical activities. Pt departs today's session in no acute distress, all voiced concerns/questions addressed appropriately from PT perspective.      OBJECTIVE IMPAIRMENTS: Abnormal gait, decreased activity tolerance, decreased balance, decreased endurance, decreased mobility, difficulty walking, decreased ROM, decreased strength, impaired perceived functional ability, improper body mechanics, and pain.   ACTIVITY LIMITATIONS: carrying, lifting, bending, sitting, standing, squatting, sleeping, stairs, transfers, locomotion level, and caring for others  PARTICIPATION LIMITATIONS: meal prep, cleaning, laundry, community activity, and occupation  PERSONAL FACTORS: Time since onset of injury/illness/exacerbation and 3+ comorbidities: ADHD, anxiety, asthma, AVN, depression, DM, HIV, HTN, hypothyroidism, PCOS are also affecting patient's functional outcome.   REHAB POTENTIAL: Good  CLINICAL DECISION MAKING: Evolving/moderate complexity  EVALUATION COMPLEXITY: Moderate   GOALS:   SHORT TERM  GOALS: Target date: 09/10/2023  Pt will demonstrate appropriate understanding and performance of initially prescribed HEP in order to facilitate improved independence with management of symptoms.  Baseline: HEP established  Goal status: INITIAL   2. Pt will report at least 25% improvement in overall pain levels over past week in order to facilitate improved tolerance to typical daily activities.   Baseline: 1-6/10  Goal status: INITIAL    LONG TERM GOALS: Target date: 10/08/2023  Pt will score 40 or greater on LEFS in order to demonstrate improved perception of function due to symptoms ([facility] 9 pts) Baseline: 11/80 Goal status: INITIAL  2.  Pt will demonstrate at least 10 degrees of L ankle DF AROM in order to facilitate improved tolerance to functional movements such as squatting/stairs.  Baseline: see ROM chart above Goal status: INITIAL  3.  Pt will demonstrate appropriate performance of final prescribed HEP in order to facilitate improved self-management of symptoms post-discharge.   Baseline: initial HEP prescribed  Goal status: INITIAL    4.  Pt will improve baseline TUG score by at least MDC  in order to indicate reduced risk of falling (cutoff score for fall risk 13.5 sec in community dwelling older adults per [facility] et al, 2000, MDC ~3sec)  Baseline: TBD  Goal status: INITIAL    5. Pt will report at least 50% decrease in overall pain levels in past week in order to facilitate improved tolerance to basic ADLs/mobility.   Baseline: 1-6/10  Goal status: INITIAL    6. Pt will report/demonstrate ability to stand for up to 1hr with LRAD and less than 3pt increase in pain in order to facilitate improved tolerance  to occupational tasks.  Baseline: mild increase with transient WB using boot and UE support  Goal status: INITIAL   PLAN:  PT FREQUENCY: 2x/week  PT DURATION: 8 weeks  PLANNED INTERVENTIONS: 97164- PT Re-evaluation, 97750- Physical Performance Testing,  97110-Therapeutic exercises, 97530- Therapeutic activity, [id number]- Neuromuscular re-education, 97535- Self Care, [id number]- Manual therapy, [id number]- Gait training, [phone]- Aquatic Therapy, Patient/Family education, Balance training, Stair training, Taping, Dry Needling, Joint mobilization, Cryotherapy, and Moist heat  PLAN FOR NEXT SESSION: Review/update HEP PRN. Work on [employer] as appropriate with emphasis on gentle ankle mobility, comfortable progression of WB in boot as able/appropriate per physician referral. Appropriate AD use. Symptom modification strategies as indicated/appropriate.    Alm DELENA Jenny PT, DPT 08/13/2023 2:52 PM    For all possible CPT codes, reference the Planned Interventions line above.     Check all conditions that are expected to impact treatment: {Conditions expected to impact treatment:Musculoskeletal disorders    Discharge addendum 11/25/2023  PHYSICAL THERAPY DISCHARGE SUMMARY  Visits from Start of Care: 1  Current functional level related to goals / functional outcomes: Unable to be assessed   Remaining deficits: Unable to be assessed   Education / Equipment: Unable to be assessed  Patient goals were unable to be assessed. Patient is being discharged due to not returning since the last visit.  Alm DELENA Jenny PT, DPT 11/25/2023 8:56 AM

## 2023-08-13 ENCOUNTER — Other Ambulatory Visit: Payer: Self-pay

## 2023-08-13 ENCOUNTER — Ambulatory Visit: Attending: Orthopaedic Surgery | Admitting: Physical Therapy

## 2023-08-13 ENCOUNTER — Encounter: Payer: Self-pay | Admitting: Physical Therapy

## 2023-08-13 DIAGNOSIS — R2689 Other abnormalities of gait and mobility: Secondary | ICD-10-CM | POA: Diagnosis present

## 2023-08-13 DIAGNOSIS — M25572 Pain in left ankle and joints of left foot: Secondary | ICD-10-CM | POA: Diagnosis present

## 2023-08-15 ENCOUNTER — Ambulatory Visit

## 2023-08-16 ENCOUNTER — Other Ambulatory Visit: Payer: Self-pay | Admitting: Internal Medicine

## 2023-08-19 ENCOUNTER — Telehealth: Admitting: Nurse Practitioner

## 2023-08-19 ENCOUNTER — Other Ambulatory Visit: Payer: Self-pay | Admitting: Infectious Diseases

## 2023-08-19 ENCOUNTER — Telehealth: Payer: Self-pay | Admitting: Infectious Diseases

## 2023-08-19 ENCOUNTER — Ambulatory Visit: Payer: Self-pay

## 2023-08-19 DIAGNOSIS — R5081 Fever presenting with conditions classified elsewhere: Secondary | ICD-10-CM | POA: Diagnosis not present

## 2023-08-19 DIAGNOSIS — R509 Fever, unspecified: Secondary | ICD-10-CM

## 2023-08-19 DIAGNOSIS — K922 Gastrointestinal hemorrhage, unspecified: Secondary | ICD-10-CM

## 2023-08-19 MED ORDER — PANTOPRAZOLE SODIUM 40 MG PO TBEC: 40.0000 mg | DELAYED_RELEASE_TABLET | Freq: Every day | ORAL | 1 refills | Status: DC

## 2023-08-19 NOTE — Telephone Encounter (Signed)
 Pt texting that she had temp to 102 over w/e and felt poorly.  I asked her to call clinic and get an urgent visit with IM residents.

## 2023-08-19 NOTE — Telephone Encounter (Signed)
 Copied from CRM [id number]. Topic: Clinical - Red Word Triage >> Aug 19, 2023  3:41 PM Lindsay French H wrote: Red Word that prompted transfer to Nurse Triage: Patient called and said she had a fever on 4/27 and all weekend and it was 102 and she doesn't have a fever today but she is still feeling weak and super tired and cold, patient has experienced a headache all weekend, patient did take tylenol  for fever oxycodone  for her foot.   Chief Complaint: Fever  Symptoms: Fatigue, Chills, Headache  Frequency: Since Friday Night  Pertinent Negatives: Patient denies chest pain, dyspnea  Disposition: [] ED /[] Urgent Care (no appt availability in office) / [] Appointment(In office/virtual)/ [x]  [facility]/ [] Home Care/ [] Refused Recommended Disposition /[] [facility]/ []  Follow-up with PCP  Additional Notes: EE is being triaged for a fever she has had since Friday night. The patient also complains of fatigue, chills, and a headache. Initially the patient's temperature reached 102, and lowered to 99. The temperature has remained at 99 since lowering, and the patient confirms she is taking Tylenol . The patient reports being under a blanket and is still very cold. Due to lack of in office availability, Virtual appointment made for today with [facility].   Reason for Disposition  Fever present > 3 days (72 hours)  Answer Assessment - Initial Assessment Questions 1. TEMPERATURE: "What is the most recent temperature?"  "How was it measured?"      99-99.Something per the patient  2. ONSET: "When did the fever start?"      Since Friday Night  3. CHILLS: "Do you have chills?" If yes: "How bad are they?"  (e.g., none, mild, moderate, severe)   - NONE: no chills   - MILD: feeling cold   - MODERATE: feeling very cold, some shivering (feels better under a thick blanket)   - SEVERE: feeling extremely cold with shaking chills (general body shaking, rigors; even under a  thick blanket)      Severe  4. OTHER SYMPTOMS: "Do you have any other symptoms besides the fever?"  (e.g., abdomen pain, cough, diarrhea, earache, headache, sore throat, urination pain)     Fatigue, Headache  5. CAUSE: If there are no symptoms, ask: "What do you think is causing the fever?"      Unsure  6. CONTACTS: "Does anyone else in the family have an infection?"     No  7. TREATMENT: "What have you done so far to treat this fever?" (e.g., medications)     Tylenol   8. IMMUNOCOMPROMISE: "Do you have of the following: diabetes, HIV positive, splenectomy, cancer chemotherapy, chronic steroid treatment, transplant patient, etc."     Recent Surgery  9. PREGNANCY: "Is there any chance you are pregnant?" "When was your last menstrual period?"     No and No  10. TRAVEL: "Have you traveled out of the country in the last month?" (e.g., travel history, exposures)       No  Protocols used: [facility]

## 2023-08-19 NOTE — Progress Notes (Signed)
 Virtual Visit Consent   Safiatou Barritt, you are scheduled for a virtual visit with a [facility] provider today. Just as with appointments in the office, your consent must be obtained to participate. Your consent will be active for this visit and any virtual visit you may have with one of our providers in the next 365 days. If you have a MyChart account, a copy of this consent can be sent to you electronically.  As this is a virtual visit, video technology does not allow for your provider to perform a traditional examination. This may limit your provider's ability to fully assess your condition. If your provider identifies any concerns that need to be evaluated in person or the need to arrange testing (such as labs, EKG, etc.), we will make arrangements to do so. Although advances in technology are sophisticated, we cannot ensure that it will always work on either your end or our end. If the connection with a video visit is poor, the visit may have to be switched to a telephone visit. With either a video or telephone visit, we are not always able to ensure that we have a secure connection.  By engaging in this virtual visit, you consent to the provision of healthcare and authorize for your insurance to be billed (if applicable) for the services provided during this visit. Depending on your insurance coverage, you may receive a charge related to this service.  I need to obtain your verbal consent now. Are you willing to proceed with your visit today? Lindsay French has provided verbal consent on 08/19/2023 for a virtual visit (video or telephone). Mardene Shake, FNP  Date: 08/19/2023 7:02 PM   Virtual Visit via Video Note   I, Mardene Shake, connected with  Lindsay French  ([id number], 02-Nov-1980) on 08/19/23 at  7:00 PM EDT by a video-enabled telemedicine application and verified that I am speaking with the correct person using two identifiers.  Location: Patient: Virtual Visit Location Patient: Home Provider:  Virtual Visit Location Provider: Home Office   I discussed the limitations of evaluation and management by telemedicine and the availability of in person appointments. The patient expressed understanding and agreed to proceed.    History of Present Illness: Lindsay French is a 43 y.o. who identifies as a female who was assigned female at birth, and is being seen today for a fever.  She broke her foot (Lisfranc fracture) February 2nd- she has since had surgery  She has been in a splint or boot since that time  She denies any open wound  Has been seeing Dr. Alwin Baars (her PCP) since that time   At the time of admission she was found to be anemia as well, received an infusion and is now taking iron  supplements  Most recent CBC was 07/30/23  Last week 422 she started PT and feels that she may have overdone it   08/16/23 she started to have a fever her T max was 102  She has been taking tylenol  since that time every 6-8 hours T max since that time has been 99  Denies any other symptoms (denies increased pain at surgical site, denies any swelling at surgical site/ changes in sensation/ changes in skin color at surgical site)  She also had a headache throughout the weekend which is unusual for her  No respiratory symptoms   She wears her boot all day & a compression sock that she changes 1-2 times a week   History is significant for Upper GI bleed,  Type 2 DM, HIV disease (non detectable viral load )   Problems:  Patient Active Problem List   Diagnosis Date Noted   Acute gastric ulcer with hemorrhage 06/22/2023   Gastritis and gastroduodenitis 06/22/2023   Upper GI bleed 06/21/2023   Morbid obesity (HCC) 06/21/2023   Closed fracture dislocation foot with nonunion 06/21/2023   Iron  deficiency anemia due to chronic blood loss 06/21/2023   Melena 06/21/2023   Lisfranc dislocation, left, initial encounter 05/26/2023   Diabetic foot infection (HCC) 04/30/2023   Insomnia 04/30/2023   Thrush  06/12/2022   Depression with anxiety 01/29/2022   At risk for heart disease 09/20/2021   Folate deficiency 08/25/2021   Vitamin D  deficiency 08/25/2021   Hyperlipidemia associated with type 2 diabetes mellitus (HCC) 08/25/2021   Type 2 diabetes mellitus with hyperglycemia, without long-term current use of insulin  (HCC) 08/25/2021   Hypertension associated with type 2 diabetes mellitus (HCC) 08/25/2021   At risk for hyperglycemia 08/25/2021   Dental infection 10/21/2020   Left upper extremity swelling 10/21/2020   Intractable nausea and vomiting 10/12/2020   Chronic narcotic use 08/30/2020   Arthralgia of left lower leg    Cellulitis of left leg    Severe protein-calorie malnutrition (HCC)    Hypothyroidism    GERD (gastroesophageal reflux disease)    Hepatic steatosis    Thrombocytosis    Macrocytic anemia    Back pain 03/24/2019   Infertility, female 03/24/2019   PCOS (polycystic ovarian syndrome) 03/24/2019   HIV disease (HCC) 04/24/2018   Health care maintenance 04/24/2018   Hyperlipidemia 04/10/2017   Acute respiratory failure with hypoxia (HCC) 04/16/2016   Acute bronchitis 04/16/2016   Hypertension 04/16/2016   Class 3 obesity 03/21/2016   Screening examination for venereal disease 03/30/2014   Encounter for long-term (current) use of medications 03/30/2014   Idiopathic thrombocytopenic purpura (ITP) (HCC) 11/14/2010   ADHD (attention deficit hyperactivity disorder) 11/14/2010    Allergies:  Allergies  Allergen Reactions   Hydrocodone  Itching   Latex Other (See Comments)    As a teenager when using latex condoms she would get a yeast infection.    Mucinex  Fast-Max Chest Cong Ms [Guaifenesin ] Nausea And Vomiting   Ancef  [Cefazolin ] Rash   Medications:  Current Outpatient Medications:    albuterol  (VENTOLIN  HFA) 108 (90 Base) MCG/ACT inhaler, INHALE 1 PUFF INTO THE LUNGS EVERY 6 HOURS AS NEEDED FOR WHEEZING OR SHORTNESS OF BREATH, Disp: 18 g, Rfl: 0   ALPRAZolam   (XANAX ) 1 MG tablet, Take 1 tablet (1 mg total) by mouth 3 (three) times daily as needed for anxiety., Disp: 30 tablet, Rfl: 1   budesonide -formoterol  (SYMBICORT ) 80-4.5 MCG/ACT inhaler, Inhale 2 puffs into the lungs daily. (Patient taking differently: Inhale 2 puffs into the lungs daily as needed (respiratory issues).), Disp: 1 each, Rfl: 12   diphenhydramine -acetaminophen  (TYLENOL  PM EXTRA STRENGTH) 25-500 MG TABS tablet, Take 1 tablet by mouth at bedtime as needed. (Patient taking differently: Take 3 tablets by mouth at bedtime as needed (sleep).), Disp: 28 tablet, Rfl: 3   dolutegravir -lamiVUDine  ([name] ) 50-300 MG tablet, Take 1 tablet by mouth daily. (Patient taking differently: Take 1 tablet by mouth every evening.), Disp: 90 tablet, Rfl: 3   folic acid  (FOLVITE ) 1 MG tablet, Take 1 tablet (1 mg total) by mouth daily., Disp: 30 tablet, Rfl: 6   gabapentin  (NEURONTIN ) 300 MG capsule, Take 2 capsules (600 mg total) by mouth 3 (three) times daily., Disp: 180 capsule, Rfl: 3   irbesartan  (AVAPRO )  150 MG tablet, Take 1 tablet (150 mg total) by mouth daily., Disp: 90 tablet, Rfl: 3   Iron , Ferrous Sulfate , 325 (65 Fe) MG TABS, Take 1 tablet by mouth daily., Disp: 90 tablet, Rfl: 3   methocarbamol  (ROBAXIN ) 500 MG tablet, Take 500 mg by mouth every 6 (six) hours as needed for muscle spasms., Disp: , Rfl:    oxyCODONE  (ROXICODONE ) 5 MG immediate release tablet, Take 1 tablet (5 mg total) by mouth every 4 (four) hours as needed., Disp: 30 tablet, Rfl: 0   pantoprazole  (PROTONIX ) 40 MG tablet, Take 1 tablet (40 mg total) by mouth daily., Disp: 90 tablet, Rfl: 1   polyethylene glycol (MIRALAX  / GLYCOLAX ) 17 g packet, Take 17 g by mouth daily as needed for moderate constipation., Disp: 14 each, Rfl: 0   Semaglutide ,0.25 or 0.5MG /DOS, 2 MG/3ML SOPN, Inject 0.25 mg into the skin once a week for 28 days, THEN 0.5 mg once a week for 28 days., Disp: 3 mL, Rfl: 3   Vilazodone  HCl (VIIBRYD ) 40 MG TABS, TAKE 1  TABLET BY MOUTH EVERY DAY, Disp: 90 tablet, Rfl: 2  Observations/Objective: Patient is well-developed, well-nourished in no acute distress.  Resting comfortably  at home.  Head is normocephalic, atraumatic.  No labored breathing.  Speech is clear and coherent with logical content.  Patient is alert and oriented at baseline.  Compression sock removed, able to wiggle toes and no erythema or swelling noted around surgical site  Assessment and Plan:  1. Fever in other diseases  Due to high risk PMH and post surgical status with recent activity advised patient to follow up in office tomorrow for lab work/ to work up/ rule out infection  Dicussed Ddx of viral illness, patient agreeable to follow up with PCP in am/surgical team or UC if unable to be seen in either other office for workup   May continue tylenol  tonight   Follow Up Instructions: I discussed the assessment and treatment plan with the patient. The patient was provided an opportunity to ask questions and all were answered. The patient agreed with the plan and demonstrated an understanding of the instructions.  A copy of instructions were sent to the patient via MyChart unless otherwise noted below.    The patient was advised to call back or seek an in-person evaluation if the symptoms worsen or if the condition fails to improve as anticipated.    Mardene Shake, FNP

## 2023-08-20 ENCOUNTER — Encounter: Payer: Self-pay | Admitting: Physical Therapy

## 2023-08-20 ENCOUNTER — Other Ambulatory Visit: Payer: Self-pay | Admitting: Infectious Diseases

## 2023-08-20 ENCOUNTER — Ambulatory Visit: Admitting: Physical Therapy

## 2023-08-20 DIAGNOSIS — B20 Human immunodeficiency virus [HIV] disease: Secondary | ICD-10-CM

## 2023-08-20 MED ORDER — DOVATO 50-300 MG PO TABS: 1.0000 | ORAL_TABLET | Freq: Every evening | ORAL | 3 refills | Status: DC

## 2023-08-22 ENCOUNTER — Ambulatory Visit: Admitting: Physical Therapy

## 2023-08-26 ENCOUNTER — Ambulatory Visit: Admitting: Physical Therapy

## 2023-08-28 ENCOUNTER — Ambulatory Visit: Admitting: Physical Therapy

## 2023-08-28 ENCOUNTER — Telehealth: Payer: Self-pay | Admitting: Infectious Diseases

## 2023-08-28 ENCOUNTER — Other Ambulatory Visit: Payer: Self-pay | Admitting: Infectious Diseases

## 2023-08-28 DIAGNOSIS — G4700 Insomnia, unspecified: Secondary | ICD-10-CM

## 2023-08-28 DIAGNOSIS — F418 Other specified anxiety disorders: Secondary | ICD-10-CM

## 2023-08-28 DIAGNOSIS — F909 Attention-deficit hyperactivity disorder, unspecified type: Secondary | ICD-10-CM

## 2023-08-28 MED ORDER — ALPRAZOLAM 1 MG PO TABS: 1.0000 mg | ORAL_TABLET | Freq: Three times a day (TID) | ORAL | 1 refills | Status: DC | PRN

## 2023-08-28 NOTE — Telephone Encounter (Signed)
 Pt texting she feels poorly but has been feeling better today.  I urged her to contact the IMTS but she did not.  She asks for her alprazolam  to be refilled. Last fill 07-2023.  refilled No other providers filling benzodiazepines. She has been getting narcotics from other provider.   [place]CPMP reviewed.

## 2023-08-29 ENCOUNTER — Other Ambulatory Visit: Payer: Self-pay | Admitting: Infectious Diseases

## 2023-08-29 DIAGNOSIS — F909 Attention-deficit hyperactivity disorder, unspecified type: Secondary | ICD-10-CM

## 2023-08-29 DIAGNOSIS — E1165 Type 2 diabetes mellitus with hyperglycemia: Secondary | ICD-10-CM

## 2023-08-30 ENCOUNTER — Other Ambulatory Visit: Payer: Self-pay | Admitting: Internal Medicine

## 2023-09-02 ENCOUNTER — Other Ambulatory Visit: Payer: Self-pay | Admitting: Infectious Diseases

## 2023-09-02 ENCOUNTER — Telehealth (INDEPENDENT_AMBULATORY_CARE_PROVIDER_SITE_OTHER): Payer: Self-pay | Admitting: Licensed Clinical Social Worker

## 2023-09-02 ENCOUNTER — Ambulatory Visit (INDEPENDENT_AMBULATORY_CARE_PROVIDER_SITE_OTHER): Admitting: Licensed Clinical Social Worker

## 2023-09-02 DIAGNOSIS — F418 Other specified anxiety disorders: Secondary | ICD-10-CM

## 2023-09-02 DIAGNOSIS — B370 Candidal stomatitis: Secondary | ICD-10-CM

## 2023-09-02 DIAGNOSIS — E11628 Type 2 diabetes mellitus with other skin complications: Secondary | ICD-10-CM

## 2023-09-02 DIAGNOSIS — B20 Human immunodeficiency virus [HIV] disease: Secondary | ICD-10-CM

## 2023-09-02 MED ORDER — FLUCONAZOLE 100 MG PO TABS: 100.0000 mg | ORAL_TABLET | Freq: Every day | ORAL | 3 refills | Status: AC

## 2023-09-02 NOTE — BH Specialist Note (Signed)
 Integrated Behavioral Health Initial In-Person Visit  MRN: [id number] Name: Lindsay French  Number of Integrated Behavioral Health Clinician visits: 1- Initial Visit  Session Start time: 1445    Session End time: 1500  Total time in minutes: 15   Types of Service: Introduction only  Interpretor:No. Interpretor Name and Language: N/A   Warm Hand Off Completed.        Subjective: Lindsay French is a 43 y.o. female    Patient was referred by PCP for ADHD. Patient reports the following symptoms/concerns: Patient arrived to appointment 15 minutes late. Patient advised she overslept. [facility] made patient aware there was only 15 minutes remaining due to another schedule patient.  The Licensed Clinical Engineer, building services (LCSW-A), acting as a Visual merchandiser [facility]), initiated a session with patient. [facility] introduced themselves, explained her role, and provided contact information to the patient. Confidentiality and mandated reporting were discussed, and the patient denied any suicidal ideations or intent to harm others. Patient expressed sadness and anxiety. [facility] educated patient on [facility] health, [phone] and [phone]. Patient stated she understood. Patient agreed to appointment on 05/27 at 1:30 pm. Patient requested Telehealth. Nothing further was discussed in encounter.   Amie Bald, MSW, LCSW-A She/Her Behavioral Health Clinician [facility]  Internal Medicine Center Direct Dial:[phone]  Fax [phone] Main Office Phone: [phone] [address] [place] [place], [place] [address] Website: [facility] Internal Medicine [facility]  [place], [place]  [place]

## 2023-09-02 NOTE — BH Specialist Note (Deleted)
 Patient no-showed today's appointment; appointment was for Telephone visit at 2:30Pm  Behavioral Health Clinician [facility] from here on out)  attempted patient  via Telephone number. [facility] contacted patient from telephone number [phone]. [facility] left a  VM.   [facility] will attempt patent again on   Amie Bald, MSW, LCSW-A She/Her Behavioral Health Clinician [facility]  Internal Medicine Center Direct Dial:[phone]  Fax [phone] Main Office Phone: [phone] [address] [place] [place], [place] [address] Website: [facility] Internal Medicine [facility]  [place], [place]  [facility]

## 2023-09-02 NOTE — Telephone Encounter (Signed)
 [facility] attempted patient on today for 2:30 pm consult appointment. Call was unsuccessful. [facility] left a confidential VM for patient to call office back at [phone]  Amie Bald, MSW, LCSW-A She/Her Behavioral Health Clinician [facility]  Internal Medicine Center Direct Dial:[phone]  Fax [phone] Main Office Phone: [phone] [address] [place] [place], [place] [address] Website: [facility] Internal Medicine [facility]  [place], [place]  [place]

## 2023-09-02 NOTE — Telephone Encounter (Signed)
 Medication discontinued 07/25/23.

## 2023-09-12 ENCOUNTER — Other Ambulatory Visit: Payer: Self-pay | Admitting: Infectious Diseases

## 2023-09-12 DIAGNOSIS — E1165 Type 2 diabetes mellitus with hyperglycemia: Secondary | ICD-10-CM

## 2023-09-12 MED ORDER — SEMAGLUTIDE (1 MG/DOSE) 4 MG/3ML ~~LOC~~ SOPN
1.0000 mg | PEN_INJECTOR | SUBCUTANEOUS | 3 refills | Status: AC
Start: 2023-09-12 — End: ?

## 2023-09-17 ENCOUNTER — Telehealth: Payer: Self-pay

## 2023-09-17 ENCOUNTER — Telehealth (INDEPENDENT_AMBULATORY_CARE_PROVIDER_SITE_OTHER): Payer: Self-pay | Admitting: Licensed Clinical Social Worker

## 2023-09-17 DIAGNOSIS — F418 Other specified anxiety disorders: Secondary | ICD-10-CM

## 2023-09-17 NOTE — Telephone Encounter (Signed)
 Babara Bolls (Key: [id number]) Rx #: [id number] Ozempic  (0.25 or 0.5 MG/DOSE) 2MG [name] pen-injectors Form [employer] [place]  Electronic Prior Authorization Request Form (2017 NCPDP) Created Sent to Plan Plan Response Submit Clinical Questions Determination Favorable Message from Plan Approved. This drug has been approved. Approved quantity: 3 units per 28 day(s). The drug has been approved from 09/03/2023 to 09/16/2024. Please call the pharmacy to process your prescription claim. Generic or biosimilar substitution may be required when available and preferred on the formulary.. Authorization Expiration Date: Sep 16, 2024.

## 2023-09-17 NOTE — Telephone Encounter (Signed)
 Prior Authorization for patient (Ozempic  (0.25 or 0.5 MG/DOSE) 2MG /3ML pen-injectors) came through on cover my meds was submitted with last office notes and labs awaiting approval or denial.  NWG:[id number]

## 2023-09-17 NOTE — Telephone Encounter (Signed)
 Session Start time: 1530   Session End time: 1630   Total time in minutes: 60  Referring Provider: PCP Patient/Family location: Home [facility] Provider location: Office All persons participating in visit: [facility] and Patient Types of Service: General Behavioral Integrated Care (BHI)   I connected with Babara Bolls via  Telephone or Video Enabled Telemedicine Application  and verified that I am speaking with the correct person using two identifiers. Discussed confidentiality: Yes    I discussed the limitations of telemedicine and the availability of in person appointments.  Discussed there is a possibility of technology failure and discussed alternative modes of communication if that failure occurs.   I discussed that engaging in this telemedicine visit, they consent to the provision of behavioral healthcare and the services will be billed under their insurance.   Patient and/or legal guardian expressed understanding and consented to Telemedicine visit: Yes   [facility] contacted the patient today in response to a request for support. 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 She reported increased anxiety and stated she had exhausted her supply of anxiety medication. The patient shared that previous experiences with talk therapy were not helpful and that only psychiatric care and medication management had been effective in the past.  [facility] explained that the patient's current needs fall outside the scope of [facility] licensure and clarified that [facility] is not authorized to prescribe medications. The patient acknowledged that she is currently linked with [facility] and has an assigned psychiatrist but admitted to missing several appointments. [facility] encouraged the patient to reschedule and remain engaged in services with [facility]. The patient agreed to do so.  She confirmed awareness of the local crisis line and reported that she  frequently uses it during times of distress. The patient denied suicidal ideation, visual or auditory hallucinations at the time of the encounter. A safety plan was reviewed, and the patient verbalized understanding of when to access the crisis line if needed.  The patient was informed that she is being discharged from [facility] services. She expressed understanding and agreement and plans to follow up with [facility] for ongoing psychiatric care.  Amie Bald, MSW, LCSW-A She/Her Behavioral Health Clinician [facility]  Internal Medicine Center Direct [phone]  Fax [phone] Main Office Phone: [phone] [address] [place] [place], [place] [address] Website: [facility] Internal Medicine [facility]  [place], [place]  [place]

## 2023-09-18 ENCOUNTER — Ambulatory Visit: Admitting: Licensed Clinical Social Worker

## 2023-09-18 ENCOUNTER — Ambulatory Visit (INDEPENDENT_AMBULATORY_CARE_PROVIDER_SITE_OTHER): Admitting: Licensed Clinical Social Worker

## 2023-09-18 DIAGNOSIS — F418 Other specified anxiety disorders: Secondary | ICD-10-CM

## 2023-09-18 NOTE — BH Specialist Note (Addendum)
 Operating Room Services spoke with patient on 09/17/23. Please see note from 05/27.  Session Start time: 1530   Session End time: 1630   Total time in minutes: 60   Referring Provider: PCP Patient/Family location: Home [facility] Provider location: Office All persons participating in visit: [facility] and Patient Types of Service: General Behavioral Integrated Care (BHI)   I connected with Babara Bolls via  Telephone or Video Enabled Telemedicine Application  and verified that I am speaking with the correct person using two identifiers. Discussed confidentiality: Yes    I discussed the limitations of telemedicine and the availability of in person appointments.  Discussed there is a possibility of technology failure and discussed alternative modes of communication if that failure occurs.   I discussed that engaging in this telemedicine visit, they consent to the provision of behavioral healthcare and the services will be billed under their insurance.   Patient and/or legal guardian expressed understanding and consented to Telemedicine visit: Yes    [facility] contacted the patient today in response to a request for support. 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 She reported increased anxiety and stated she had exhausted her supply of anxiety medication. The patient shared that previous experiences with talk therapy were not helpful and that only psychiatric care and medication management had been effective in the past.  [facility] explained that the patient's current needs fall outside the scope of [facility] licensure and clarified that [facility] is not authorized to prescribe medications. The patient acknowledged that she is currently linked with [facility] and has an assigned psychiatrist but admitted to missing several appointments. [facility] encouraged the patient to reschedule and remain engaged in services with [facility]. The patient agreed to do so.  She  confirmed awareness of the local crisis line and reported that she frequently uses it during times of distress. The patient denied suicidal ideation, visual or auditory hallucinations at the time of the encounter. A safety plan was reviewed, and the patient verbalized understanding of when to access the crisis line if needed.  The patient was informed that she is being discharged from [facility] services. She expressed understanding and agreement and plans to follow up with [facility] for ongoing psychiatric care.   Amie Bald, MSW, LCSW-A She/Her Behavioral Health Clinician [facility]  Internal Medicine Center Direct [phone]  Fax [phone] Main Office Phone: [phone] [address] [place] [place], [place] [address] Website: [facility] Internal Medicine [facility]  [place], [place]  [place]   Amie Bald, MSW, LCSW-A She/Her Behavioral Health Clinician [facility]  Internal Medicine Center Direct [phone]  Fax [phone] Main Office Phone: [phone] [address] [place] [place], [place] [address] Website: [facility] Internal Medicine [facility]  [place], [place]  [place]

## 2023-09-23 ENCOUNTER — Other Ambulatory Visit: Payer: Self-pay | Admitting: Internal Medicine

## 2023-09-23 ENCOUNTER — Other Ambulatory Visit

## 2023-09-23 ENCOUNTER — Ambulatory Visit: Admitting: Licensed Clinical Social Worker

## 2023-09-23 DIAGNOSIS — Z79899 Other long term (current) drug therapy: Secondary | ICD-10-CM

## 2023-09-23 DIAGNOSIS — B20 Human immunodeficiency virus [HIV] disease: Secondary | ICD-10-CM

## 2023-09-23 DIAGNOSIS — Z113 Encounter for screening for infections with a predominantly sexual mode of transmission: Secondary | ICD-10-CM

## 2023-09-24 ENCOUNTER — Other Ambulatory Visit: Payer: Self-pay | Admitting: Internal Medicine

## 2023-09-25 NOTE — Telephone Encounter (Signed)
 Medication discontinued 07/30/23

## 2023-10-08 ENCOUNTER — Other Ambulatory Visit: Payer: Self-pay | Admitting: Infectious Diseases

## 2023-10-08 DIAGNOSIS — E1165 Type 2 diabetes mellitus with hyperglycemia: Secondary | ICD-10-CM

## 2023-10-08 MED ORDER — SEMAGLUTIDE (1 MG/DOSE) 4 MG/3ML ~~LOC~~ SOPN: 0.7500 mg | PEN_INJECTOR | SUBCUTANEOUS | 3 refills | Status: DC

## 2023-10-09 ENCOUNTER — Other Ambulatory Visit: Payer: Self-pay

## 2023-10-09 DIAGNOSIS — E1165 Type 2 diabetes mellitus with hyperglycemia: Secondary | ICD-10-CM

## 2023-10-09 MED ORDER — SEMAGLUTIDE (1 MG/DOSE) 4 MG/3ML ~~LOC~~ SOPN
0.7500 mg | PEN_INJECTOR | SUBCUTANEOUS | 3 refills | Status: DC
Start: 2023-10-09 — End: 2023-12-04

## 2023-10-09 NOTE — Telephone Encounter (Signed)
 Pharmacy is requesting a new rx for Semaglutide  1 mg the last rx that was sent for Semaglutide  1 mg has instructions for 0.75 mg. Please send in a new rx with the correct instructions.

## 2023-10-10 NOTE — Telephone Encounter (Signed)
 Pharmacy is requesting a new rx for Semaglutide  the instructions are for 0.75 mg which is not available.

## 2023-10-11 ENCOUNTER — Ambulatory Visit: Payer: Self-pay

## 2023-10-11 NOTE — Telephone Encounter (Signed)
 FYI Only or Action Required?: Action required by provider: request for appointment. No availability. Pt. Would like to be worked in Monday.  Patient was last seen in primary care on 08/19/2023 by Mardene Shake, FNP. Called Nurse Triage reporting Foot pain. Symptoms began several months ago. Left foot pain from fracture. I think I've over done the walking. Pain 8/10. Interventions attempted: Rest, hydration, or home remedies. Symptoms are: gradually worsening.  Triage Disposition: See HCP Within 4 Hours (Or PCP Triage)  Patient/caregiver understands and will follow disposition?: Yes         Copied from CRM [id number]. Topic: Clinical - Red Word Triage >> Oct 11, 2023  2:52 PM Adrianna P wrote: Red Word that prompted transfer to Nurse Triage: Pain and discomfort in left foot 9/10 Reason for Disposition  [1] SEVERE pain (e.g., excruciating, unable to do any normal activities) AND [2] not improved after 2 hours of pain medicine  Answer Assessment - Initial Assessment Questions 1. ONSET: When did the pain start?      Feb 2. LOCATION: Where is the pain located?      Left foot 3. PAIN: How bad is the pain?    (Scale 1-10; or mild, moderate, severe)  - MILD (1-3): doesn't interfere with normal activities.   - MODERATE (4-7): interferes with normal activities (e.g., work or school) or awakens from sleep, limping.   - SEVERE (8-10): excruciating pain, unable to do any normal activities, unable to walk.      9 4. WORK OR EXERCISE: Has there been any recent work or exercise that involved this part of the body?      no 5. CAUSE: What do you think is causing the foot pain?     fracture 6. OTHER SYMPTOMS: Do you have any other symptoms? (e.g., leg pain, rash, fever, numbness)     no 7. PREGNANCY: Is there any chance you are pregnant? When was your last menstrual period?     no  Protocols used: Foot Pain-A-AH

## 2023-10-14 NOTE — Telephone Encounter (Signed)
 Patient is scheduled to come in tomorrow morning 6/24 with Dr.Goodwin.

## 2023-10-15 ENCOUNTER — Encounter: Admitting: Student

## 2023-10-15 NOTE — Progress Notes (Deleted)
 July 30, 2023  06/11/2023 underwent ORIF of left 1st and 2nd metatarsal fracture, medial cuneiform fracture, first tarsometatarsal joint, second tarsometatarsal joint, Lisfranc joint and open treatment of third tarsometatarsal joint, open reduction of fourth metatarsal fracture, closed treatment of cuboid fracture  Reportedly had follow-up with orthopedics near the end of May Last fill for short course of oxycodone  on 09/02/2023     CC: {Clinic Visit Type:31040}  HPI:  Lindsay French is a 43 y.o. female with pertinent PMH of HTN, T2DM, HIV, who presents as above. Please see assessment and plan below for further details.  Medications: Current Outpatient Medications  Medication Instructions   albuterol  (VENTOLIN  HFA) 108 (90 Base) MCG/ACT inhaler INHALE 1 PUFF INTO THE LUNGS EVERY 6 HOURS AS NEEDED FOR WHEEZING OR SHORTNESS OF BREATH   ALPRAZolam  (XANAX ) 1 mg, Oral, 3 times daily PRN   budesonide -formoterol  (SYMBICORT ) 80-4.5 MCG/ACT inhaler 2 puffs, Inhalation, Daily   diphenhydramine -acetaminophen  (TYLENOL  PM EXTRA STRENGTH) 25-500 MG TABS tablet 1 tablet, Oral, At bedtime PRN   dolutegravir -lamiVUDine  ([name] ) 50-300 MG tablet 1 tablet, Oral, Every evening   fluconazole  (DIFLUCAN ) 100 mg, Oral, Daily   folic acid  (FOLVITE ) 1 mg, Oral, Daily   gabapentin  (NEURONTIN ) 600 mg, Oral, 3 times daily   irbesartan  (AVAPRO ) 150 mg, Oral, Daily   Iron , Ferrous Sulfate , 325 (65 Fe) MG TABS 1 tablet, Oral, Daily   methocarbamol  (ROBAXIN ) 500 mg, Oral, Every 6 hours PRN   oxyCODONE  (ROXICODONE ) 5 mg, Oral, Every 4 hours PRN   pantoprazole  (PROTONIX ) 40 mg, Oral, Daily   polyethylene glycol (MIRALAX  / GLYCOLAX ) 17 g, Oral, Daily PRN   Semaglutide  (1 MG/DOSE) 0.75 mg, Subcutaneous, Weekly   Vilazodone  HCl (VIIBRYD ) 40 mg, Oral, Daily     Review of Systems:   Pertinent items noted in HPI and/or A&P.  Physical Exam:  There were no vitals filed for this visit.  Constitutional:***. In no acute  distress. HEENT: Normocephalic, atraumatic, Sclera non-icteric, PERRL, EOM intact Cardio:Regular rate and rhythm. 2+ bilateral {PulseLoc:28294} pulses. Pulm:Clear to auscultation bilaterally. Normal work of breathing on room air. Abdomen: Soft, non-tender, non-distended, positive bowel sounds. FDX:Wzhjupcz for extremity edema. Skin:Warm and dry. Neuro:Alert and oriented x3. No focal deficit noted. Psych:Pleasant mood and affect.   Assessment & Plan:   No problem-specific Assessment & Plan notes found for this encounter.    Patient {GC/GE:3044014::discussed with,seen with} {JGIMTSattending2025/2026:32954}  Fairy Pool, DO [facility] Center Internal Medicine Resident PGY-2 Clinic Phone: [phone] Please contact the on call pager at [phone] for any urgent or emergent needs.

## 2023-10-16 ENCOUNTER — Encounter: Payer: Self-pay | Admitting: Infectious Diseases

## 2023-10-30 ENCOUNTER — Ambulatory Visit: Admitting: Licensed Clinical Social Worker

## 2023-10-30 DIAGNOSIS — F419 Anxiety disorder, unspecified: Secondary | ICD-10-CM

## 2023-10-30 DIAGNOSIS — F418 Other specified anxiety disorders: Secondary | ICD-10-CM

## 2023-10-30 NOTE — BH Specialist Note (Signed)
 Integrated Behavioral Health via Telemedicine Visit  10/30/2023 Xcaret Morad [id number]  Number of Integrated Behavioral Health Clinician visits: Additional Visit  Session Start time: 1445   Session End time: 1500  Total time in minutes: 15   Referring Provider: PCP Patient/Family location: Home [place] Provider location: Office All persons participating in visit: [facility] and Patient Types of Service: General Behavioral Integrated Care (BHI)   I connected with Bernice Lever via  Telephone and verified that I am speaking with the correct person using two identifiers. Discussed confidentiality: Yes    I discussed the limitations of telemedicine and the availability of in person appointments.  Discussed there is a possibility of technology failure and discussed alternative modes of communication if that failure occurs.   I discussed that engaging in this telemedicine visit, they consent to the provision of behavioral healthcare and the services will be billed under their insurance.   Patient and/or legal guardian expressed understanding and consented to Telemedicine visit: Yes   During the encounter, the patient reported increased anxiety and stated she had exhausted her supply of anxiety medication. The patient shared that previous experiences with talk therapy were not helpful and that only psychiatric care and medication management had been effective in the past.  [facility] had previously assisted patient with establishing psychiatric care and patient has been scheduled. [facility] reminded patient of conversation. [facility] explained that the patient's current needs fall outside the scope of [facility] licensure and clarified that [facility] is not authorized to prescribe medications. The patient acknowledged that she is currently linked with [facility] and has an assigned psychiatrist but admitted to missing several appointments. [facility] encouraged the patient to reschedule and remain engaged in services with [facility]. The patient agreed to  do so.  [facility] screened patient for safety. She confirmed awareness of the local crisis line and reported that she frequently uses it during times of distress. The patient denied suicidal ideation, visual or auditory hallucinations at the time of the encounter. A safety plan was reviewed, and the patient verbalized understanding of when to access the crisis line if needed.  The patient was informed that she is being discharged from [facility] services. She expressed understanding and agreement and plans to follow up with [facility] for ongoing psychiatric care/ medication management.   Renda Pontes, MSW, LCSW-A She/Her Behavioral Health Clinician [facility]  Internal Medicine Center

## 2023-11-07 ENCOUNTER — Ambulatory Visit (HOSPITAL_COMMUNITY): Admission: EM | Admit: 2023-11-07 | Discharge: 2023-11-07 | Disposition: A | Discharge status: Home / Self Care
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
## 2023-11-11 ENCOUNTER — Other Ambulatory Visit: Payer: Self-pay | Admitting: Infectious Diseases

## 2023-11-11 DIAGNOSIS — F418 Other specified anxiety disorders: Secondary | ICD-10-CM

## 2023-11-11 DIAGNOSIS — B20 Human immunodeficiency virus [HIV] disease: Secondary | ICD-10-CM

## 2023-11-11 DIAGNOSIS — E1165 Type 2 diabetes mellitus with hyperglycemia: Secondary | ICD-10-CM

## 2023-11-11 DIAGNOSIS — Z113 Encounter for screening for infections with a predominantly sexual mode of transmission: Secondary | ICD-10-CM

## 2023-11-11 DIAGNOSIS — Z79899 Other long term (current) drug therapy: Secondary | ICD-10-CM

## 2023-11-12 ENCOUNTER — Telehealth: Payer: Self-pay | Admitting: Infectious Diseases

## 2023-11-12 ENCOUNTER — Ambulatory Visit (HOSPITAL_COMMUNITY)
Admission: EM | Admit: 2023-11-12 | Discharge: 2023-11-12 | Discharge status: Against Medical Advice | Attending: Psychiatry | Admitting: Psychiatry

## 2023-11-12 ENCOUNTER — Other Ambulatory Visit

## 2023-11-12 DIAGNOSIS — Z79899 Other long term (current) drug therapy: Secondary | ICD-10-CM | POA: Insufficient documentation

## 2023-11-12 DIAGNOSIS — F429 Obsessive-compulsive disorder, unspecified: Secondary | ICD-10-CM | POA: Insufficient documentation

## 2023-11-12 DIAGNOSIS — Z765 Malingerer [conscious simulation]: Secondary | ICD-10-CM | POA: Insufficient documentation

## 2023-11-12 DIAGNOSIS — K921 Melena: Secondary | ICD-10-CM | POA: Insufficient documentation

## 2023-11-12 DIAGNOSIS — D509 Iron deficiency anemia, unspecified: Secondary | ICD-10-CM | POA: Insufficient documentation

## 2023-11-12 DIAGNOSIS — Z760 Encounter for issue of repeat prescription: Secondary | ICD-10-CM | POA: Diagnosis not present

## 2023-11-12 MED ORDER — ONDANSETRON HCL 4 MG PO TABS
4.0000 mg | ORAL_TABLET | Freq: Three times a day (TID) | ORAL | Status: DC | PRN
  Filled 2023-11-12: qty 1

## 2023-11-12 MED ORDER — HYDROXYZINE HCL 25 MG PO TABS
50.0000 mg | ORAL_TABLET | ORAL | Status: DC
Start: 2023-11-12 — End: 2023-11-13
  Filled 2023-11-12: qty 2
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
## 2023-11-12 NOTE — Telephone Encounter (Signed)
 No answer to call and text Will try again tomorrow

## 2023-11-12 NOTE — ED Notes (Signed)
 Patient was offered Medication ordered by NP Gaither Pouch including Vistaril  and Zofran . Patient adamantly refused and stated that she wanted to leave AMA. NP advised of patient's request instructing Nursing to have patient sign out AMA. Patient explained risks of departing AMA and signed form. Patient escorted out to the lobby with improved ambulation/gait and departed facility without further incident.

## 2023-11-12 NOTE — ED Provider Notes (Signed)
 [facility] Continuous Assessment Admission H&P  Date: 11/12/23 Patient Name: Lindsay French MRN: [id number] Chief Complaint: need to get her anxiety medication alprazolam    Diagnoses:  Final diagnoses:  Malingering  Encounter for medication refill    HPI: Lindsay French, 43 y/o female with a history of MDD, OCD and ADHD,  presented to [facility] with BHRT.  Per the patient she needs help with getting her anxiety medication.  Patient states that she get alprazolam  she is supposed to get 1 to 2 tablets a day.  Asked if she had contacted her provider patient stated yes but the provider do not want to give her any medicine.  She also stated that the provider wanted her to do a drug test and she do not have access to do drug tests.  And keep reiterating that she is not abusing her drugs.  Writer discussed with patient that at this time we cannot refilled her alprazolam  however I can give her some hydroxyzine  to help with her anxiety.  According to patient she lives with her husband currently unemployed.  Review of patient records show that patient was seen for the same situation on 11/07/2023.  Face-to-face observation of patient, patient is alert and oriented x 4, speech is clear, maintain eye contact.  Patient denies SI, HI, AVH or paranoia.  Patient report alcohol use but did not state how much she drank.  Patient denies illicit drug use at this time.  Denies access to guns.  At this present moment patient does not seem to be in any immediate distress.  Patient does seem to be manipulating her behavior to get alprazolam .  Writer discussed with patient that she will not be getting any alprazolam  however I can give her some hydroxyzine .  Patient can be overheard screaming and making noises because she she is manipulating to get alprazolam .  [place] PDMP show patient was prescribed alprazolam  on 10/16/23 by her provider.    Patient keeps stating that the alprazolam  is the only thing that work for her.  When Clinical research associate  discussed with patient that then she need to reach out to her provider to refill her medication patient stated the provider said that she is not going to refill it until she do a drug test.  Patient was given the option if she wanted to be admitted for overnight arms.   According to nursing staff patient refuse the medication (  Total Time spent with patient: 20 minutes  Musculoskeletal  Strength & Muscle Tone: within normal limits Gait & Station: normal Patient leans: N/A  Psychiatric Specialty Exam  Presentation General Appearance:  Casual  Eye Contact: Fair  Speech: Clear and Coherent  Speech Volume: Normal  Handedness: Right   Mood and Affect  Mood: Anxious  Affect: Congruent   Thought Process  Thought Processes: Coherent  Descriptions of Associations:Intact  Orientation:Full (Time, Place and Person)  Thought Content:Obsessions    Hallucinations:Hallucinations: None  Ideas of Reference:None  Suicidal Thoughts:Suicidal Thoughts: No  Homicidal Thoughts:Homicidal Thoughts: No   Sensorium  Memory: Immediate Fair  Judgment: Fair  Insight: Fair   Art therapist  Concentration: Fair  Attention Span: Fair  Recall: Fair  Fund of Knowledge: Fair  Language: Fair   Psychomotor Activity  Psychomotor Activity: Psychomotor Activity: Normal   Assets  Assets: Desire for Improvement   Sleep  Sleep: Sleep: Fair Number of Hours of Sleep: 6   Nutritional Assessment (For OBS and FBC admissions only) Has the patient had a weight loss or  gain of 10 pounds or more in the last 3 months?: No Has the patient had a decrease in food intake/or appetite?: No Does the patient have dental problems?: No Does the patient have eating habits or behaviors that may be indicators of an eating disorder including binging or inducing vomiting?: No Has the patient recently lost weight without trying?: 0 Has the patient been eating poorly  because of a decreased appetite?: 0 Malnutrition Screening Tool Score: 0    Physical Exam HENT:     Head: Normocephalic.     Nose: Nose normal.  Eyes:     Pupils: Pupils are equal, round, and reactive to light.  Cardiovascular:     Rate and Rhythm: Normal rate.  Pulmonary:     Effort: Pulmonary effort is normal.  Musculoskeletal:        General: Normal range of motion.     Cervical back: Normal range of motion.  Neurological:     General: No focal deficit present.     Mental Status: She is alert.  Psychiatric:        Mood and Affect: Mood normal.        Behavior: Behavior normal.    Review of Systems  Constitutional: Negative.   HENT: Negative.    Eyes: Negative.   Respiratory: Negative.    Cardiovascular: Negative.   Gastrointestinal: Negative.   Genitourinary: Negative.   Musculoskeletal: Negative.   Skin: Negative.   Neurological: Negative.   Psychiatric/Behavioral:  The patient is nervous/anxious.     There were no vitals taken for this visit. There is no height or weight on file to calculate BMI.  Past Psychiatric History: MDD, GAD, ADHD  Is the patient at risk to self? No  Has the patient been a risk to self in the past 6 months? No .    Has the patient been a risk to self within the distant past? No   Is the patient a risk to others? No   Has the patient been a risk to others in the past 6 months? No   Has the patient been a risk to others within the distant past? No   Past Medical History: See chart  Family History: Unknown  Social History: Alcohol  Last Labs:  Office Visit on 07/30/2023  Component Date Value Ref Range Status   WBC 07/30/2023 8.9  3.4 - 10.8 x10E3/uL Final   RBC 07/30/2023 3.79  3.77 - 5.28 x10E6/uL Final   Hemoglobin 07/30/2023 9.2 (L)  11.1 - 15.9 g/dL Final   Hematocrit 95/91/7974 31.1 (L)  34.0 - 46.6 % Final   MCV 07/30/2023 82  79 - 97 fL Final   MCH 07/30/2023 24.3 (L)  26.6 - 33.0 pg Final   MCHC 07/30/2023 29.6 (L)   31.5 - 35.7 g/dL Final   RDW 95/91/7974 18.5 (H)  11.7 - 15.4 % Final   Platelets 07/30/2023 229  150 - 450 x10E3/uL Final  Lab on 07/09/2023  Component Date Value Ref Range Status   WBC 07/09/2023 10.3  3.4 - 10.8 x10E3/uL Final   RBC 07/09/2023 3.51 (L)  3.77 - 5.28 x10E6/uL Final   Hemoglobin 07/09/2023 9.3 (L)  11.1 - 15.9 g/dL Final   Hematocrit 96/81/7974 30.4 (L)  34.0 - 46.6 % Final   MCV 07/09/2023 87  79 - 97 fL Final   MCH 07/09/2023 26.5 (L)  26.6 - 33.0 pg Final   MCHC 07/09/2023 30.6 (L)  31.5 - 35.7 g/dL Final  RDW 07/09/2023 18.5 (H)  11.7 - 15.4 % Final   Platelets 07/09/2023 486 (H)  150 - 450 x10E3/uL Final  Admission on 06/21/2023, Discharged on 06/22/2023  Component Date Value Ref Range Status   WBC 06/21/2023 11.7 (H)  4.0 - 10.5 K/uL Final   RBC 06/21/2023 2.31 (L)  3.87 - 5.11 MIL/uL Final   Hemoglobin 06/21/2023 6.5 (LL)  12.0 - 15.0 g/dL Final   Comment: REPEATED TO VERIFY THIS CRITICAL RESULT HAS VERIFIED AND BEEN CALLED TO D. CLAYTON, RN BY LEAH KLAR ON 02 28 2025 AT 0908, AND HAS BEEN READ BACK.     HCT 06/21/2023 21.4 (L)  36.0 - 46.0 % Final   MCV 06/21/2023 92.6  80.0 - 100.0 fL Final   MCH 06/21/2023 28.1  26.0 - 34.0 pg Final   MCHC 06/21/2023 30.4  30.0 - 36.0 g/dL Final   RDW 97/71/7974 19.2 (H)  11.5 - 15.5 % Final   Platelets 06/21/2023 430 (H)  150 - 400 K/uL Final   nRBC 06/21/2023 0.7 (H)  0.0 - 0.2 % Final   Performed at [facility], [address], [place], [place] [address]   Sodium 06/21/2023 137  135 - 145 mmol/L Final   Potassium 06/21/2023 3.7  3.5 - 5.1 mmol/L Final   Chloride 06/21/2023 103  98 - 111 mmol/L Final   CO2 06/21/2023 24  22 - 32 mmol/L Final   Glucose, Bld 06/21/2023 110 (H)  70 - 99 mg/dL Final   Glucose reference range applies only to samples taken after fasting for at least 8 hours.   BUN 06/21/2023 6  6 - 20 mg/dL Final   Creatinine, Ser 06/21/2023 0.52  0.44 - 1.00 mg/dL Final   Calcium  06/21/2023 8.9   8.9 - 10.3 mg/dL Final   GFR, Estimated 06/21/2023 >60  >60 mL/min Final   Comment: (NOTE) Calculated using the CKD-EPI Creatinine Equation (2021)    Anion gap 06/21/2023 10  5 - 15 Final   Performed at [facility], [address], [place], [place] [address]   Iron  06/21/2023 17 (L)  28 - 170 ug/dL Final   TIBC 97/71/7974 448  250 - 450 ug/dL Final   Saturation Ratios 06/21/2023 4 (L)  10.4 - 31.8 % Final   UIBC 06/21/2023 431  ug/dL Final   Performed at [facility], [address], [place], [place] [address]   Ferritin 06/21/2023 6 (L)  11 - 307 ng/mL Final   Performed at [facility], [address] [place], [place] [address]   Vitamin B-12 06/21/2023 329  180 - 914 pg/mL Final   Comment: (NOTE) This assay is not validated for testing neonatal or myeloproliferative syndrome specimens for Vitamin B12 levels. Performed at [facility], [address] [place], [place] [address]    Folate 06/21/2023 5.2 (L)  >5.9 ng/mL Final   Performed at [facility], [address] [place], [place] [address]   Hgb A1c MFr Bld 06/21/2023 5.1  4.8 - 5.6 % Final   Comment: (NOTE) Pre diabetes:          5.7%-6.4%  Diabetes:              >6.4%  Glycemic control for   <7.0% adults with diabetes    Mean Plasma Glucose 06/21/2023 99.67  mg/dL Final   Performed at [facility], [address] [place], [place] [address]  Lactic Acid, Venous 06/21/2023 0.7  0.5 - 1.9 mmol/L Final   Performed at [facility], [address] [place], [place] [address]   ABO/RH(D) 06/21/2023 A POS   Final   Antibody Screen 06/21/2023 NEG   Final   Sample Expiration 06/21/2023 06/24/2023,2359   Final   Unit Number 06/21/2023 [id number]   Final   Blood Component Type 06/21/2023 RBC LR PHER1   Final   Unit division 06/21/2023 00   Final   Status of Unit 06/21/2023 [facility]   Final   Transfusion Status 06/21/2023 OK TO TRANSFUSE   Final   Crossmatch Result 06/21/2023     Final                   Value:Compatible Performed at [facility], [address], [place], [place] [address]    Order Confirmation 06/21/2023    Final                   Value:ORDER PROCESSED BY BLOOD BANK Performed at [facility], [address], [place], [place] [address]    Hemoglobin 06/21/2023 7.6 (L)  12.0 - 15.0 g/dL Final   HCT 97/71/7974 25.3 (L)  36.0 - 46.0 % Final   Performed at [facility], [address], [place], [place] [address]   ISSUE DATE / TIME 06/21/2023 [id number]   Final   Blood Product Unit Number 06/21/2023 [id number]   Final   PRODUCT CODE 06/21/2023 [id number]   Final   Unit Type and Rh 06/21/2023 6200   Final   Blood Product Expiration Date 06/21/2023 [id number]   Final   WBC 06/22/2023 12.2 (H)  4.0 - 10.5 K/uL Final   RBC 06/22/2023 2.73 (L)  3.87 - 5.11 MIL/uL Final   Hemoglobin 06/22/2023 7.5 (L)  12.0 - 15.0 g/dL Final   HCT 96/98/7974 24.6 (L)  36.0 - 46.0 % Final   MCV 06/22/2023 90.1  80.0 - 100.0 fL Final   MCH 06/22/2023 27.5  26.0 - 34.0 pg Final   MCHC 06/22/2023 30.5  30.0 - 36.0 g/dL Final   RDW 96/98/7974 18.8 (H)  11.5 - 15.5 % Final   Platelets 06/22/2023 413 (H)  150 - 400 K/uL Final   nRBC 06/22/2023 0.8 (H)  0.0 - 0.2 % Final   Performed at [facility], [address], [place], [place] [address]   Sodium 06/22/2023 139  135 - 145 mmol/L Final   Potassium 06/22/2023 3.6  3.5 - 5.1 mmol/L Final   Chloride 06/22/2023 106  98 - 111 mmol/L Final   CO2 06/22/2023 24  22 - 32 mmol/L Final   Glucose, Bld 06/22/2023 107 (H)  70 - 99 mg/dL Final   Glucose reference range applies only to samples taken after fasting for at least 8 hours.   BUN 06/22/2023 5 (L)  6 - 20 mg/dL Final   Creatinine, Ser 06/22/2023 0.56  0.44 - 1.00 mg/dL Final   Calcium  06/22/2023 9.1  8.9 - 10.3 mg/dL Final   GFR, Estimated 06/22/2023 >60  >60 mL/min Final   Comment: (NOTE) Calculated using the CKD-EPI Creatinine Equation  (2021)    Anion gap 06/22/2023 9  5 - 15 Final   Performed at [facility], [address], [place], [place] [address]   Sodium 06/22/2023 138  135 - 145 mmol/L Final   Potassium 06/22/2023 3.4 (L)  3.5 - 5.1 mmol/L Final  Chloride 06/22/2023 105  98 - 111 mmol/L Final   CO2 06/22/2023 24  22 - 32 mmol/L Final   Glucose, Bld 06/22/2023 111 (H)  70 - 99 mg/dL Final   Glucose reference range applies only to samples taken after fasting for at least 8 hours.   BUN 06/22/2023 <5 (L)  6 - 20 mg/dL Final   Creatinine, Ser 06/22/2023 0.65  0.44 - 1.00 mg/dL Final   Calcium  06/22/2023 9.4  8.9 - 10.3 mg/dL Final   GFR, Estimated 06/22/2023 >60  >60 mL/min Final   Comment: (NOTE) Calculated using the CKD-EPI Creatinine Equation (2021)    Anion gap 06/22/2023 9  5 - 15 Final   Performed at [facility], [address], [place], [place] [address]   SURGICAL PATHOLOGY 06/22/2023    Final-Edited                   Value:SURGICAL PATHOLOGY CASE: [id number] PATIENT: Kamdyn Cumming Surgical Pathology Report     Clinical History: anemia, dark stool, iron  deficiency, R/O H. Pylori (cm)     FINAL MICROSCOPIC DIAGNOSIS:  A. STOMACH, BIOPSY: Gastric antral and oxyntic mucosa with focal hyperemia. Negative for Helicobacter pylori.   GROSS DESCRIPTION:  A.  Gastric biopsy r/o H. pylori, received in formalin is a 0.8 x 0.8 x 0.2 cm aggregate of multiple soft, tan tissue fragments submitted entirely in block A1.  SMB 06/24/23  Final Diagnosis performed by Norleen Dover, MD.   Electronically signed 06/25/2023 Technical and / or Professional components performed at [facility]. [facility], [address] [place], [place] [address].  Immunohistochemistry Technical component (if applicable) was performed at [facility]. [address], [place], [place] [address].   IMMUNOHISTOCHEMISTRY DISCLAIMER (if applicable): Some of these immunohistochemical  st                         ains may have been developed and the performance characteristics determine by [facility]. Some may not have been cleared or approved by the U.S. Food and Drug Administration. The FDA has determined that such clearance or approval is not necessary. This test is used for clinical purposes. It should not be regarded as investigational or for research. This laboratory is certified under the Clinical Laboratory Improvement Amendments of 1988 (CLIA-88) as qualified to perform high complexity clinical laboratory testing.  The controls stained appropriately.   IHC stains are performed on formalin fixed, paraffin embedded tissue using a 3,3diaminobenzidine (DAB) chromogen and Leica Bond Autostainer System. The staining intensity of the nucleus is score manually and is reported as the percentage of tumor cell nuclei demonstrating specific nuclear staining. The specimens are fixed in 10% Neutral Formalin for at least 6 hours and up to 72hrs. These tests are validated                          on decalcified tissue. Results should be interpreted with caution given the possibility of false negative results on decalcified specimens. Antibody Clones are as follows ER-clone 77F, PR-clone 16, Ki67- clone MM1. Some of these immunohistochemical stains may have been developed and the performance characteristics determined by [facility].    Glucose-Capillary 06/22/2023 107 (H)  70 - 99 mg/dL Final   Glucose reference range applies only to samples taken after fasting for at least 8 hours.  Lab on 06/19/2023  Component Date Value Ref Range  Status   RPR Ser Ql 06/19/2023 Non Reactive  Non Reactive Final   Cholesterol, Total 06/19/2023 182  100 - 199 mg/dL Final   Triglycerides 97/73/7974 166 (H)  0 - 149 mg/dL Final   HDL 97/73/7974 43  >39 mg/dL Final   VLDL Cholesterol Cal 06/19/2023 29  5 - 40 mg/dL Final   LDL Chol Calc (NIH) 06/19/2023 110 (H)  0 - 99  mg/dL Final   Chol/HDL Ratio 06/19/2023 4.2  0.0 - 4.4 ratio Final   Comment:                                   T. Chol/HDL Ratio                                             Men  Women                               1/2 Avg.Risk  3.4    3.3                                   Avg.Risk  5.0    4.4                                2X Avg.Risk  9.6    7.1                                3X Avg.Risk 23.4   11.0    HIV-1 RNA Viral Load 06/19/2023 <20  copies/mL Final   Comment: HIV-1 RNA not detected The reportable range for this assay is 20 to 10,000,000 copies HIV-1 RNA/mL.    HIV-1 RNA Viral Load Log 06/19/2023 CANCELED  log10copy/mL Final-Edited   Comment: Unable to calculate result since non-numeric result obtained for component test.  Result canceled by the ancillary.    CD4 T Cell Abs 06/19/2023 1,067  400 - 1,790 /uL Final   CD4 % Helper T Cell 06/19/2023 46  33 - 65 % Final   Performed at [facility], [address] [place], [place] [address]   WBC 06/19/2023 14.7 (H)  3.4 - 10.8 x10E3/uL Final   RBC 06/19/2023 2.45 (LL)  3.77 - 5.28 x10E6/uL Final   Hemoglobin 06/19/2023 7.0 (LL)  11.1 - 15.9 g/dL Final   Hematocrit 97/73/7974 22.0 (L)  34.0 - 46.6 % Final   MCV 06/19/2023 90  79 - 97 fL Final   MCH 06/19/2023 28.6  26.6 - 33.0 pg Final   MCHC 06/19/2023 31.8  31.5 - 35.7 g/dL Final   RDW 97/73/7974 18.4 (H)  11.7 - 15.4 % Final   Platelets 06/19/2023 553 (H)  150 - 450 x10E3/uL Final   NRBC 06/19/2023 1 (H)  0 - 0 % Final   Glucose 06/19/2023 112 (H)  70 - 99 mg/dL Final   BUN 97/73/7974 6  6 - 24 mg/dL Final   Creatinine, Ser 06/19/2023 0.45 (L)  0.57 - 1.00 mg/dL Final   eGFR 97/73/7974 124  >59 mL/min/1.73  Final   BUN/Creatinine Ratio 06/19/2023 13  9 - 23 Final   Sodium 06/19/2023 139  134 - 144 mmol/L Final   Potassium 06/19/2023 4.3  3.5 - 5.2 mmol/L Final   Chloride 06/19/2023 100  96 - 106 mmol/L Final   CO2 06/19/2023 18 (L)  20 - 29 mmol/L Final    Anion Gap 06/19/2023 21.0 (H)  10.0 - 18.0 mmol/L Final   Calcium  06/19/2023 9.5  8.7 - 10.2 mg/dL Final   Total Protein 97/73/7974 6.3  6.0 - 8.5 g/dL Final   Albumin 97/73/7974 3.9  3.9 - 4.9 g/dL Final   Globulin, Total 06/19/2023 2.4  1.5 - 4.5 g/dL Final   Bilirubin Total 06/19/2023 0.4  0.0 - 1.2 mg/dL Final   Alkaline Phosphatase 06/19/2023 90  44 - 121 IU/L Final   AST 06/19/2023 22  0 - 40 IU/L Final   ALT 06/19/2023 12  0 - 32 IU/L Final  Admission on 06/11/2023, Discharged on 06/11/2023  Component Date Value Ref Range Status   Sodium 06/11/2023 139  135 - 145 mmol/L Final   Potassium 06/11/2023 3.6  3.5 - 5.1 mmol/L Final   Chloride 06/11/2023 102  98 - 111 mmol/L Final   CO2 06/11/2023 25  22 - 32 mmol/L Final   Glucose, Bld 06/11/2023 102 (H)  70 - 99 mg/dL Final   Glucose reference range applies only to samples taken after fasting for at least 8 hours.   BUN 06/11/2023 11  6 - 20 mg/dL Final   Creatinine, Ser 06/11/2023 0.61  0.44 - 1.00 mg/dL Final   Calcium  06/11/2023 9.3  8.9 - 10.3 mg/dL Final   GFR, Estimated 06/11/2023 >60  >60 mL/min Final   Comment: (NOTE) Calculated using the CKD-EPI Creatinine Equation (2021)    Anion gap 06/11/2023 12  5 - 15 Final   Performed at [facility], [address] [place], [place] [address]   WBC 06/11/2023 7.3  4.0 - 10.5 K/uL Final   RBC 06/11/2023 1.87 (L)  3.87 - 5.11 MIL/uL Final   Hemoglobin 06/11/2023 5.8 (LL)  12.0 - 15.0 g/dL Final   Comment: REPEATED TO VERIFY THIS CRITICAL RESULT HAS VERIFIED AND BEEN CALLED TO SARA BLOCK,RN BY ZELDA BEECH ON 02 18 2025 AT 1114, AND HAS BEEN READ BACK.     HCT 06/11/2023 19.1 (L)  36.0 - 46.0 % Final   MCV 06/11/2023 102.1 (H)  80.0 - 100.0 fL Final   MCH 06/11/2023 31.0  26.0 - 34.0 pg Final   MCHC 06/11/2023 30.4  30.0 - 36.0 g/dL Final   RDW 97/81/7974 19.9 (H)  11.5 - 15.5 % Final   Platelets 06/11/2023 465 (H)  150 - 400 K/uL Final   nRBC 06/11/2023 0.7 (H)  0.0 - 0.2  % Final   Performed at [facility], [address], [place], [place] [address]   Preg, Serum 06/11/2023 NEGATIVE  NEGATIVE Final   Comment:        THE SENSITIVITY OF THIS METHODOLOGY IS >10 mIU/mL. Performed at [facility], [address], [place], [place] [address]    Glucose-Capillary 06/11/2023 109 (H)  70 - 99 mg/dL Final   Glucose reference range applies only to samples taken after fasting for at least 8 hours.   ABO/RH(D) 06/11/2023 A POS   Final   Antibody Screen 06/11/2023 NEG   Final   Sample Expiration 06/11/2023 06/14/2023,2359   Final   Unit Number  06/11/2023 [id number]   Final   Blood Component Type 06/11/2023 RBC LR PHER2   Final   Unit division 06/11/2023 00   Final   Status of Unit 06/11/2023 ISSUED,FINAL   Final   Transfusion Status 06/11/2023 OK TO TRANSFUSE   Final   Crossmatch Result 06/11/2023 Compatible   Final   Unit Number 06/11/2023 [id number]   Final   Blood Component Type 06/11/2023 RED CELLS,LR   Final   Unit division 06/11/2023 00   Final   Status of Unit 06/11/2023 REL FROM [facility]   Final   Transfusion Status 06/11/2023 OK TO TRANSFUSE   Final   Crossmatch Result 06/11/2023    Final                   Value:Compatible Performed at [facility], [address], [place], [place] [address]    Glucose-Capillary 06/11/2023 93  70 - 99 mg/dL Final   Glucose reference range applies only to samples taken after fasting for at least 8 hours.   Order Confirmation 06/11/2023    Final                   Value:ORDER PROCESSED BY BLOOD BANK Performed at [facility], [address], [place], [place] [address]    ISSUE DATE / TIME 06/11/2023 [id number]   Final   Blood Product Unit Number 06/11/2023 [id number]   Final   PRODUCT CODE 06/11/2023 [id number]   Final   Unit Type and Rh 06/11/2023 6200   Final   Blood Product Expiration Date 06/11/2023 [id number]   Final   Blood Product Unit Number 06/11/2023 [id number]   Final    PRODUCT CODE 06/11/2023 [id number]   Final   Unit Type and Rh 06/11/2023 6200   Final   Blood Product Expiration Date 06/11/2023 [id number]   Final   Order Confirmation 06/11/2023    Final                   Value:ORDER PROCESSED BY BLOOD BANK BB SAMPLE OR UNITS ALREADY AVAILABLE Performed at [facility], [address], [place], [place] [address]    Order Confirmation 06/11/2023    Final                   Value:ORDER PROCESSED BY BLOOD BANK Performed at [facility], [address], [place], [place] [address]    Glucose-Capillary 06/11/2023 126 (H)  70 - 99 mg/dL Final   Glucose reference range applies only to samples taken after fasting for at least 8 hours.    Allergies: Hydrocodone , Latex, Mucinex  fast-max chest cong ms [guaifenesin ], and Ancef  [cefazolin ]  Medications:  Facility Ordered Medications  Medication   hydrOXYzine  (ATARAX ) tablet 50 mg   ondansetron  (ZOFRAN ) tablet 4 mg   PTA Medications  Medication Sig   albuterol  (VENTOLIN  HFA) 108 (90 Base) MCG/ACT inhaler INHALE 1 PUFF INTO THE LUNGS EVERY 6 HOURS AS NEEDED FOR WHEEZING OR SHORTNESS OF BREATH   budesonide -formoterol  (SYMBICORT ) 80-4.5 MCG/ACT inhaler Inhale 2 puffs into the lungs daily. (Patient taking differently: Inhale 2 puffs into the lungs daily as needed (respiratory issues).)   diphenhydramine -acetaminophen  (TYLENOL  PM EXTRA STRENGTH) 25-500 MG TABS tablet Take 1 tablet by mouth at bedtime as needed. (Patient taking differently: Take 3 tablets by mouth at bedtime as needed (sleep).)   oxyCODONE  (ROXICODONE ) 5 MG immediate release tablet Take 1 tablet (5 mg total) by mouth every 4 (four) hours  as needed.   methocarbamol  (ROBAXIN ) 500 MG tablet Take 500 mg by mouth every 6 (six) hours as needed for muscle spasms.   polyethylene glycol (MIRALAX  / GLYCOLAX ) 17 g packet Take 17 g by mouth daily as needed for moderate constipation.   folic acid  (FOLVITE ) 1 MG tablet Take 1 tablet (1 mg total) by mouth  daily.   gabapentin  (NEURONTIN ) 300 MG capsule Take 2 capsules (600 mg total) by mouth 3 (three) times daily.   Vilazodone  HCl (VIIBRYD ) 40 MG TABS TAKE 1 TABLET BY MOUTH EVERY DAY   Iron , Ferrous Sulfate , 325 (65 Fe) MG TABS Take 1 tablet by mouth daily.   irbesartan  (AVAPRO ) 150 MG tablet Take 1 tablet (150 mg total) by mouth daily.   pantoprazole  (PROTONIX ) 40 MG tablet Take 1 tablet (40 mg total) by mouth daily.   dolutegravir -lamiVUDine  ([name] ) 50-300 MG tablet Take 1 tablet by mouth every evening.   ALPRAZolam  (XANAX ) 1 MG tablet Take 1 tablet (1 mg total) by mouth 3 (three) times daily as needed for anxiety.   Semaglutide , 1 MG/DOSE, 4 MG/3ML SOPN Inject 0.75 mg into the skin once a week.      Medical Decision Making  Observation unit    Recommendations  Based on my evaluation the patient does not appear to have an emergency medical condition.  Gaither Pouch, NP 11/12/23  8:49 PM

## 2023-11-12 NOTE — Progress Notes (Signed)
   11/12/23 2017  [facility] Triage Screening (Walk-ins at [facility] only)  How Did You Hear About Us ? Legal System  What Is the Reason for Your Visit/Call Today? Pt arrived with BHRT and GPD due to her recent mental state. [name] has been out of her anxiety medicine for abouyt 10 days and she used alcohol to try and suppress her anxiety ubtil she could get them refilled. As a result she became super anxious and is currently in a overwhelming emotional state. She cannot self regulate and has been in and out of crying fits. She also had a panic attack during the triage session and was able to be calmed down by the nurse and QP. She is labeled as emergent due to her behavior and current state. She denies SI, HI, AVH, and substance use. She admits to alcohol use stating that she had enough when asked how much she had to drink.  How Long Has This Been Causing You Problems? 1 wk - 1 month  Have You Recently Had Any Thoughts About Hurting Yourself? No  Are You Planning to Commit Suicide/Harm Yourself At This time? No  Have you Recently Had Thoughts About Hurting Someone [name]? No  Are You Planning To Harm Someone At This Time? No  Physical Abuse Denies  Verbal Abuse Denies  Sexual Abuse Denies  Exploitation of patient/patient's resources Denies  Self-Neglect Denies  Are you currently experiencing any auditory, visual or other hallucinations? No  Have You Used Any Alcohol or Drugs in the Past 24 Hours? Yes  What Did You Use and How Much? 'Enough  Do you have any current medical co-morbidities that require immediate attention? Yes  Please describe current medical co-morbidities that require immediate attention: Diabetes  What Do You Feel Would Help You the Most Today? Medication(s);Stress Management;Social Support;Treatment for Depression or other mood problem  Determination of Need Emergent (2 hours)  Options For Referral [facility];Other: Comment;Facility-Based Crisis;ED Visit;Therapeutic Triage  Services  Determination of Need filed? Yes

## 2023-11-13 ENCOUNTER — Telehealth: Payer: Self-pay | Admitting: Infectious Diseases

## 2023-11-13 DIAGNOSIS — F909 Attention-deficit hyperactivity disorder, unspecified type: Secondary | ICD-10-CM

## 2023-11-13 DIAGNOSIS — F418 Other specified anxiety disorders: Secondary | ICD-10-CM

## 2023-11-13 MED ORDER — HYDROXYZINE HCL 25 MG PO TABS: 25.0000 mg | ORAL_TABLET | Freq: Three times a day (TID) | ORAL | 0 refills | Status: DC | PRN

## 2023-11-13 NOTE — Telephone Encounter (Signed)
 Called Ms Brigandi back from her multiple texts.  Has been having panic attacks every day for the last week- thought she was having a reaction to her new anti-depression therapy but she has quit taking this.  She becomes quickly tearful on the phone.  She has a f/u with her psychiatrist tomorrow. She is unable to get through to anyone on the phone.  She is sleeping a few hours a night, anxiety is worse then. No nightmares.  This is not right! This is not ok! She has gone to [facility]. She states she was given a benadryl  (vistaril /zofran ) and was monitored. She was worse in her opinion. Has run out of xanax  from her rx from 10-06-23. She denies SI or HI. SHe denies hallucinations (auditory or visual) Will give her atarax  to see if we can get her some sleep until she has her f/u with her psychiatrist tomorrow.

## 2023-12-03 ENCOUNTER — Other Ambulatory Visit: Payer: Self-pay

## 2023-12-03 ENCOUNTER — Encounter: Payer: Self-pay | Admitting: Infectious Diseases

## 2023-12-03 ENCOUNTER — Ambulatory Visit: Admitting: Infectious Diseases

## 2023-12-03 VITALS — BP 131/66 | HR 89 | Temp 98.2°F | Ht 62.0 in | Wt 259.8 lb

## 2023-12-03 DIAGNOSIS — B20 Human immunodeficiency virus [HIV] disease: Secondary | ICD-10-CM

## 2023-12-03 DIAGNOSIS — I10 Essential (primary) hypertension: Secondary | ICD-10-CM | POA: Diagnosis not present

## 2023-12-03 DIAGNOSIS — Z113 Encounter for screening for infections with a predominantly sexual mode of transmission: Secondary | ICD-10-CM

## 2023-12-03 DIAGNOSIS — F419 Anxiety disorder, unspecified: Secondary | ICD-10-CM

## 2023-12-03 DIAGNOSIS — Z79899 Other long term (current) drug therapy: Secondary | ICD-10-CM | POA: Diagnosis not present

## 2023-12-03 DIAGNOSIS — E785 Hyperlipidemia, unspecified: Secondary | ICD-10-CM

## 2023-12-03 DIAGNOSIS — M545 Low back pain, unspecified: Secondary | ICD-10-CM

## 2023-12-03 DIAGNOSIS — Z7985 Long-term (current) use of injectable non-insulin antidiabetic drugs: Secondary | ICD-10-CM | POA: Diagnosis not present

## 2023-12-03 DIAGNOSIS — G8929 Other chronic pain: Secondary | ICD-10-CM

## 2023-12-03 DIAGNOSIS — E1165 Type 2 diabetes mellitus with hyperglycemia: Secondary | ICD-10-CM | POA: Diagnosis not present

## 2023-12-03 DIAGNOSIS — M549 Dorsalgia, unspecified: Secondary | ICD-10-CM

## 2023-12-03 DIAGNOSIS — F411 Generalized anxiety disorder: Secondary | ICD-10-CM | POA: Insufficient documentation

## 2023-12-03 DIAGNOSIS — E1169 Type 2 diabetes mellitus with other specified complication: Secondary | ICD-10-CM

## 2023-12-03 DIAGNOSIS — Z23 Encounter for immunization: Secondary | ICD-10-CM | POA: Diagnosis not present

## 2023-12-03 DIAGNOSIS — K250 Acute gastric ulcer with hemorrhage: Secondary | ICD-10-CM

## 2023-12-03 MED ORDER — TIZANIDINE HCL 4 MG PO TABS: 4.0000 mg | ORAL_TABLET | Freq: Every day | ORAL | 1 refills | Status: DC

## 2023-12-03 NOTE — Assessment & Plan Note (Signed)
 Will try tizanidine.

## 2023-12-03 NOTE — Assessment & Plan Note (Signed)
 Well controlled on her current rx

## 2023-12-03 NOTE — Assessment & Plan Note (Signed)
 She continues on low dose ozempic  Will check her glc today.

## 2023-12-03 NOTE — Assessment & Plan Note (Signed)
 Will check her lipids today Introduce idea of statin.

## 2023-12-03 NOTE — Addendum Note (Signed)
 Addended by: Johna Kearl C on: 12/03/2023 03:35 PM   Modules accepted: Orders

## 2023-12-03 NOTE — Addendum Note (Signed)
 Addended by: ANTONE DWAYNE SAILOR on: 12/03/2023 03:29 PM   Modules accepted: Orders

## 2023-12-03 NOTE — Assessment & Plan Note (Signed)
 She is having irregular periods.  Asked her to f/u with Dr Faythe. She needs PAP, asked her to f/u with her gyn as well.  Will get mammo No problems with art.  Will check her labs today.  PPV 20 today Will get covid and flu this fall.  Will see her back in 3-4 months.

## 2023-12-03 NOTE — Progress Notes (Signed)
 Subjective:    Patient ID: Lindsay French, female  DOB: [birth year], 43 y.o.        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  Depression/anxiety/ADHD, DM2, obesity.    She  had fall on May 26, 2023 and broke her L foot. She was seen in ED and f/u with ortho. She was taking advil, tylenol  and oxycodone  for pain and developed dark stools. She was advised to stop the advil.  She had surgery 2-18 and pre-op her Hgb was 5.8. She was transfused and underwent repair of the 4 broken bones. She was d/c with asa for DVT prophylaxis, motrin, oxycodone .  We did get her to come in on 2-26 and her repeat hgb was 7.  She was direct admitted and was transfused 1 u prbc.  She underwent upper GI and was found to have several ulcers without bleeding. She deferred colonoscopy.   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 Has worked with attorney to try to correct this.   I last saw her 07-2023, frequently in contact via text.  She was recently in [facility] with anxiety issues, panic attack, cardiac issues (top was beating faster than top). Had MSW f/u.  She feels like this has been worse since her foot fracture. Her husband is not able to help her (spinal stenosis) and to get her daycare re-opened she needs to clean thoroughly.  She owes back fees for water ($600) and electric ($1000).  Got cut off from food stamps, social security just covers house payment. Has remainder to cover groceries, car payment. Has worked with [employer], out of the garden. Needs to get food stamps restarted.  Has decreased appetite due to anxiety. Has been taking ozepemic every other week. Has to take imodium up to tid due to anxiety.  Her recent psych visit had her xanax  1mg  tid prn. Also on prozac, recently increased to 20mg . ADHD untreated (rx did not seem to be making a difference) and not sleeping  (tried all otcs- Mg+, melatonin...).  Would like something to help her sleep. Has tried tizanidine  (her husbands prescription) prev which worked.   Wt down 23# since last visit.   FSGs have been in the 130s.  Has been off her gabapentin  (forgot about it).  Has been in stores, increases her anxiety...  HIV 1 RNA Quant (Copies/mL)  Date Value  06/26/2021 23 (H)  08/30/2020 <20 (H)  03/10/2020 <20   HIV-1 RNA Viral Load (copies/mL)  Date Value  06/19/2023 <20  02/27/2022 <20   CD4 T Cell Abs (/uL)  Date Value  06/19/2023 1,067  02/27/2022 963  08/30/2020 664     Health Maintenance  Topic Date Due  . FOOT EXAM  Never done  . OPHTHALMOLOGY EXAM  Never done  . Diabetic kidney evaluation - Urine ACR  Never done  . HPV VACCINES (1 - Risk 3-dose SCDM series) Never done  . Pneumococcal Vaccine: 19-49 Years (2 of 2 - PCV) 11/14/2011  . Cervical Cancer Screening (HPV/Pap Cotest)  12/21/2014  . COVID-19 Vaccine (5 - 2024-25 season) 12/23/2022  . INFLUENZA VACCINE  11/22/2023  . HEMOGLOBIN A1C  12/19/2023  . Diabetic kidney evaluation - eGFR measurement  06/21/2024  . DTaP/Tdap/Td (2 - Td or Tdap) 05/25/2033  . Hepatitis B Vaccines  Completed  . Hepatitis C Screening  Completed  . HIV Screening  Completed  . Meningococcal B Vaccine  Aged Out      Review of Systems  Constitutional:  Positive for weight loss.  Respiratory:  Negative for cough and shortness of breath.   Gastrointestinal:  Positive for diarrhea. Negative for constipation.  Genitourinary:  Negative for dysuria.  Neurological:  Positive for sensory change (L toes post surgery).  Psychiatric/Behavioral:  The patient is nervous/anxious and has insomnia.     Please see HPI. All other systems reviewed and negative.     Objective:  Physical Exam Vitals reviewed.  Constitutional:      General: She is not in acute distress.    Appearance: She is obese. She is not toxic-appearing.  HENT:     Mouth/Throat:      Mouth: Mucous membranes are moist.     Pharynx: No oropharyngeal exudate.  Eyes:     Extraocular Movements: Extraocular movements intact.     Pupils: Pupils are equal, round, and reactive to light.  Cardiovascular:     Rate and Rhythm: Normal rate and regular rhythm.  Pulmonary:     Effort: Pulmonary effort is normal.     Breath sounds: Normal breath sounds.  Abdominal:     General: Bowel sounds are normal. There is no distension.     Palpations: Abdomen is soft.     Tenderness: There is no abdominal tenderness.  Musculoskeletal:        General: Normal range of motion.     Cervical back: Normal range of motion and neck supple.     Right lower leg: No edema.     Left lower leg: No edema.  Neurological:     General: No focal deficit present.     Mental Status: She is alert.  Psychiatric:        Mood and Affect: Mood is anxious.        Speech: Speech normal.            Assessment & Plan:

## 2023-12-03 NOTE — Assessment & Plan Note (Signed)
 Will check her h/h today.

## 2023-12-03 NOTE — Assessment & Plan Note (Signed)
 She seems better today Less anxious (states she took a xanax  prior to leaving home) Appreciate psych f/u.

## 2023-12-04 ENCOUNTER — Other Ambulatory Visit: Payer: Self-pay | Admitting: Infectious Diseases

## 2023-12-04 DIAGNOSIS — E1165 Type 2 diabetes mellitus with hyperglycemia: Secondary | ICD-10-CM

## 2023-12-04 MED ORDER — SEMAGLUTIDE (1 MG/DOSE) 4 MG/3ML ~~LOC~~ SOPN: 0.7500 mg | PEN_INJECTOR | SUBCUTANEOUS | 3 refills | Status: DC

## 2023-12-05 LAB — CBC
Hematocrit: 38.5 % (ref 34.0–46.6)
Hemoglobin: 12.3 g/dL (ref 11.1–15.9)
MCH: 30.4 pg (ref 26.6–33.0)
MCHC: 31.9 g/dL (ref 31.5–35.7)
MCV: 95 fL (ref 79–97)
Platelets: 235 x10E3/uL (ref 150–450)
RBC: 4.05 x10E6/uL (ref 3.77–5.28)
RDW: 15.2 % (ref 11.7–15.4)
WBC: 8.6 x10E3/uL (ref 3.4–10.8)

## 2023-12-05 LAB — T-HELPER CELLS (CD4) COUNT (NOT AT ARMC)
CD4 % Helper T Cell: 45 % (ref 33–65)
CD4 T Cell Abs: 1229 /uL (ref 400–1790)

## 2023-12-05 LAB — COMPREHENSIVE METABOLIC PANEL WITH GFR
ALT: 97 IU/L — ABNORMAL HIGH (ref 0–32)
AST: 124 IU/L — ABNORMAL HIGH (ref 0–40)
Albumin: 4.2 g/dL (ref 3.9–4.9)
Alkaline Phosphatase: 169 IU/L — ABNORMAL HIGH (ref 44–121)
BUN/Creatinine Ratio: 6 — ABNORMAL LOW (ref 9–23)
BUN: 3 mg/dL — ABNORMAL LOW (ref 6–24)
Bilirubin Total: 0.4 mg/dL (ref 0.0–1.2)
CO2: 20 mmol/L (ref 20–29)
Calcium: 9.3 mg/dL (ref 8.7–10.2)
Chloride: 98 mmol/L (ref 96–106)
Creatinine, Ser: 0.5 mg/dL — ABNORMAL LOW (ref 0.57–1.00)
Globulin, Total: 2.8 g/dL (ref 1.5–4.5)
Glucose: 87 mg/dL (ref 70–99)
Potassium: 3.5 mmol/L (ref 3.5–5.2)
Sodium: 138 mmol/L (ref 134–144)
Total Protein: 7 g/dL (ref 6.0–8.5)
eGFR: 120 mL/min/1.73 (ref >59)

## 2023-12-05 LAB — LIPID PANEL
Chol/HDL Ratio: 5.3 ratio — ABNORMAL HIGH (ref 0.0–4.4)
Cholesterol, Total: 247 mg/dL — ABNORMAL HIGH (ref 100–199)
HDL: 47 mg/dL (ref >39)
LDL Chol Calc (NIH): 169 mg/dL — ABNORMAL HIGH (ref 0–99)
Triglycerides: 169 mg/dL — ABNORMAL HIGH (ref 0–149)
VLDL Cholesterol Cal: 31 mg/dL (ref 5–40)

## 2023-12-05 LAB — HIV-1 RNA QUANT-NO REFLEX-BLD: HIV-1 RNA Viral Load: <20 {copies}/mL

## 2023-12-05 LAB — HEMOGLOBIN A1C
Est. average glucose Bld gHb Est-mCnc: 94 mg/dL
Hgb A1c MFr Bld: 4.9 % (ref 4.8–5.6)

## 2023-12-05 LAB — RPR: RPR Ser Ql: NONREACTIVE

## 2023-12-09 ENCOUNTER — Other Ambulatory Visit: Payer: Self-pay | Admitting: Infectious Diseases

## 2023-12-09 ENCOUNTER — Telehealth: Payer: Self-pay

## 2023-12-09 DIAGNOSIS — E1165 Type 2 diabetes mellitus with hyperglycemia: Secondary | ICD-10-CM

## 2023-12-09 MED ORDER — SEMAGLUTIDE (1 MG/DOSE) 4 MG/3ML ~~LOC~~ SOPN: 0.7500 mg | PEN_INJECTOR | SUBCUTANEOUS | 3 refills | Status: DC

## 2023-12-09 NOTE — Telephone Encounter (Signed)
 Received a fax from the pharmacy regarding a rx for ozempic . Per pharmacy Please verify directions and strength for ozempic . There are 3 pens: The 0.25 mg/0.5ml (2 mg/ 3 ml) delivers 0.25 mg or 0.5 mg, The 1 mg (4 mg/3 ml delivers 1 mg and 2 mg (8 mg/ 3 ml) delivers 2 mg. Separate rxs are required for each pen. Please send in a new rx.

## 2023-12-17 ENCOUNTER — Telehealth: Payer: Self-pay | Admitting: Infectious Diseases

## 2023-12-17 NOTE — Telephone Encounter (Signed)
 Pt texting me that her SBP is in the 80-90s. With pictures of her readings.  I  asked her to stop her anti-htn rx.  I asked her to go to ED/UC for recheck if any further low readings.  She is holding her anti-htn rx.

## 2023-12-31 ENCOUNTER — Telehealth: Payer: Self-pay | Admitting: Infectious Diseases

## 2023-12-31 NOTE — Telephone Encounter (Signed)
 Lindsay French continues to text me photos of her BP readings and sx. 108/70 I have again encouraged her to seek care in UC or ED This am SBP 123

## 2024-01-06 ENCOUNTER — Ambulatory Visit

## 2024-01-07 ENCOUNTER — Telehealth: Payer: Self-pay | Admitting: Infectious Diseases

## 2024-01-07 DIAGNOSIS — B354 Tinea corporis: Secondary | ICD-10-CM

## 2024-01-07 MED ORDER — FLUCONAZOLE 100 MG PO TABS: 100.0000 mg | ORAL_TABLET | Freq: Every day | ORAL | 0 refills | Status: AC

## 2024-01-07 NOTE — Telephone Encounter (Signed)
 Pt texted picture of her intertriginous candida.  Will send in rx for diflucan  100mg  for 5 days.

## 2024-01-09 ENCOUNTER — Encounter (HOSPITAL_COMMUNITY): Payer: Self-pay | Admitting: Emergency Medicine

## 2024-01-09 ENCOUNTER — Other Ambulatory Visit: Payer: Self-pay

## 2024-01-09 ENCOUNTER — Emergency Department (HOSPITAL_COMMUNITY)

## 2024-01-09 ENCOUNTER — Observation Stay (HOSPITAL_COMMUNITY)
Admission: EM | Admit: 2024-01-09 | Discharge: 2024-01-11 | Disposition: A | Discharge status: Home / Self Care | Attending: Internal Medicine | Admitting: Internal Medicine

## 2024-01-09 DIAGNOSIS — R109 Unspecified abdominal pain: Secondary | ICD-10-CM | POA: Diagnosis present

## 2024-01-09 DIAGNOSIS — B20 Human immunodeficiency virus [HIV] disease: Secondary | ICD-10-CM | POA: Diagnosis present

## 2024-01-09 DIAGNOSIS — F418 Other specified anxiety disorders: Secondary | ICD-10-CM | POA: Diagnosis present

## 2024-01-09 DIAGNOSIS — I10 Essential (primary) hypertension: Secondary | ICD-10-CM | POA: Diagnosis present

## 2024-01-09 DIAGNOSIS — E66813 Obesity, class 3: Secondary | ICD-10-CM | POA: Diagnosis not present

## 2024-01-09 DIAGNOSIS — K219 Gastro-esophageal reflux disease without esophagitis: Secondary | ICD-10-CM | POA: Diagnosis present

## 2024-01-09 DIAGNOSIS — E876 Hypokalemia: Secondary | ICD-10-CM | POA: Diagnosis not present

## 2024-01-09 DIAGNOSIS — F419 Anxiety disorder, unspecified: Secondary | ICD-10-CM | POA: Insufficient documentation

## 2024-01-09 DIAGNOSIS — E785 Hyperlipidemia, unspecified: Secondary | ICD-10-CM | POA: Diagnosis not present

## 2024-01-09 DIAGNOSIS — K700 Alcoholic fatty liver: Secondary | ICD-10-CM | POA: Insufficient documentation

## 2024-01-09 DIAGNOSIS — R112 Nausea with vomiting, unspecified: Secondary | ICD-10-CM | POA: Diagnosis present

## 2024-01-09 DIAGNOSIS — F32A Depression, unspecified: Secondary | ICD-10-CM | POA: Diagnosis present

## 2024-01-09 DIAGNOSIS — Z79899 Other long term (current) drug therapy: Secondary | ICD-10-CM | POA: Insufficient documentation

## 2024-01-09 DIAGNOSIS — D693 Immune thrombocytopenic purpura: Secondary | ICD-10-CM | POA: Diagnosis present

## 2024-01-09 DIAGNOSIS — R9431 Abnormal electrocardiogram [ECG] [EKG]: Secondary | ICD-10-CM | POA: Diagnosis present

## 2024-01-09 DIAGNOSIS — N200 Calculus of kidney: Secondary | ICD-10-CM | POA: Insufficient documentation

## 2024-01-09 DIAGNOSIS — R1110 Vomiting, unspecified: Secondary | ICD-10-CM

## 2024-01-09 DIAGNOSIS — K760 Fatty (change of) liver, not elsewhere classified: Secondary | ICD-10-CM | POA: Diagnosis present

## 2024-01-09 DIAGNOSIS — Z21 Asymptomatic human immunodeficiency virus [HIV] infection status: Secondary | ICD-10-CM | POA: Diagnosis not present

## 2024-01-09 LAB — URINALYSIS, ROUTINE W REFLEX MICROSCOPIC
Bilirubin Urine: NEGATIVE
Glucose, UA: NEGATIVE mg/dL
Hgb urine dipstick: NEGATIVE
Ketones, ur: NEGATIVE mg/dL
Leukocytes,Ua: NEGATIVE
Nitrite: NEGATIVE
Protein, ur: NEGATIVE mg/dL
Specific Gravity, Urine: <=1.005 (ref 1.005–1.030)
pH: 5 (ref 5.0–8.0)

## 2024-01-09 LAB — BLOOD GAS, VENOUS
Acid-base deficit: 2.7 mmol/L — ABNORMAL HIGH (ref 0.0–2.0)
Bicarbonate: 20.6 mmol/L (ref 20.0–28.0)
O2 Saturation: 84.5 %
Patient temperature: 37
pCO2, Ven: 31 mmHg — ABNORMAL LOW (ref 44–60)
pH, Ven: 7.43 (ref 7.25–7.43)
pO2, Ven: 53 mmHg — ABNORMAL HIGH (ref 32–45)

## 2024-01-09 LAB — COMPREHENSIVE METABOLIC PANEL WITH GFR
ALT: 153 U/L — ABNORMAL HIGH (ref 0–44)
AST: 195 U/L — ABNORMAL HIGH (ref 15–41)
Albumin: 4 g/dL (ref 3.5–5.0)
Alkaline Phosphatase: 114 U/L (ref 38–126)
Anion gap: 24 — ABNORMAL HIGH (ref 5–15)
BUN: <5 mg/dL — ABNORMAL LOW (ref 6–20)
CO2: 18 mmol/L — ABNORMAL LOW (ref 22–32)
Calcium: 9.1 mg/dL (ref 8.9–10.3)
Chloride: 97 mmol/L — ABNORMAL LOW (ref 98–111)
Creatinine, Ser: 0.65 mg/dL (ref 0.44–1.00)
GFR, Estimated: >60 mL/min (ref >60)
Glucose, Bld: 127 mg/dL — ABNORMAL HIGH (ref 70–99)
Potassium: 3.3 mmol/L — ABNORMAL LOW (ref 3.5–5.1)
Sodium: 139 mmol/L (ref 135–145)
Total Bilirubin: 1.4 mg/dL — ABNORMAL HIGH (ref 0.0–1.2)
Total Protein: 7.2 g/dL (ref 6.5–8.1)

## 2024-01-09 LAB — MAGNESIUM: Magnesium: 0.8 mg/dL — CL (ref 1.7–2.4)

## 2024-01-09 LAB — CBC
HCT: 43.3 % (ref 36.0–46.0)
Hemoglobin: 14 g/dL (ref 12.0–15.0)
MCH: 31.1 pg (ref 26.0–34.0)
MCHC: 32.3 g/dL (ref 30.0–36.0)
MCV: 96.2 fL (ref 80.0–100.0)
Platelets: 196 K/uL (ref 150–400)
RBC: 4.5 MIL/uL (ref 3.87–5.11)
RDW: 17.7 % — ABNORMAL HIGH (ref 11.5–15.5)
WBC: 9.3 K/uL (ref 4.0–10.5)
nRBC: 0 % (ref 0.0–0.2)

## 2024-01-09 LAB — URINE DRUG SCREEN
Amphetamines: NEGATIVE
Barbiturates: NEGATIVE
Benzodiazepines: POSITIVE — AB
Cocaine: NEGATIVE
Fentanyl: NEGATIVE
Methadone Scn, Ur: NEGATIVE
Opiates: NEGATIVE
Tetrahydrocannabinol: NEGATIVE

## 2024-01-09 LAB — BETA-HYDROXYBUTYRIC ACID: Beta-Hydroxybutyric Acid: 0.43 mmol/L — ABNORMAL HIGH (ref 0.05–0.27)

## 2024-01-09 LAB — PHOSPHORUS: Phosphorus: 2 mg/dL — ABNORMAL LOW (ref 2.5–4.6)

## 2024-01-09 MED ORDER — DOLUTEGRAVIR SODIUM 50 MG PO TABS
50.0000 mg | ORAL_TABLET | Freq: Every evening | ORAL | Status: DC
  Administered 2024-01-09 – 2024-01-10 (×2): 50 mg via ORAL
  Filled 2024-01-09 (×2): qty 1

## 2024-01-09 MED ORDER — ONDANSETRON HCL 4 MG/2ML IJ SOLN
4.0000 mg | Freq: Once | INTRAMUSCULAR | Status: AC
  Administered 2024-01-09: 4 mg via INTRAVENOUS
  Filled 2024-01-09: qty 2

## 2024-01-09 MED ORDER — LAMIVUDINE 150 MG PO TABS
300.0000 mg | ORAL_TABLET | Freq: Every evening | ORAL | Status: DC
Start: 2024-01-09 — End: 2024-01-11
  Administered 2024-01-09 – 2024-01-10 (×2): 300 mg via ORAL
  Filled 2024-01-09 (×2): qty 2

## 2024-01-09 MED ORDER — NYSTATIN 100000 UNIT/GM EX POWD
Freq: Three times a day (TID) | CUTANEOUS | Status: DC
  Filled 2024-01-09: qty 15

## 2024-01-09 MED ORDER — LORAZEPAM 2 MG/ML IJ SOLN
0.5000 mg | Freq: Once | INTRAMUSCULAR | Status: AC
  Administered 2024-01-09: 0.5 mg via INTRAVENOUS
  Filled 2024-01-09: qty 1

## 2024-01-09 MED ORDER — GABAPENTIN 300 MG PO CAPS
600.0000 mg | ORAL_CAPSULE | Freq: Three times a day (TID) | ORAL | Status: DC
  Administered 2024-01-09 – 2024-01-11 (×3): 600 mg via ORAL
  Filled 2024-01-09 (×4): qty 2

## 2024-01-09 MED ORDER — HYDROXYZINE HCL 25 MG PO TABS
25.0000 mg | ORAL_TABLET | Freq: Three times a day (TID) | ORAL | Status: DC | PRN
  Administered 2024-01-09 – 2024-01-11 (×4): 25 mg via ORAL
  Filled 2024-01-09 (×4): qty 1

## 2024-01-09 MED ORDER — METOCLOPRAMIDE HCL 5 MG/ML IJ SOLN
10.0000 mg | Freq: Once | INTRAMUSCULAR | Status: AC
  Administered 2024-01-09: 10 mg via INTRAVENOUS
  Filled 2024-01-09: qty 2

## 2024-01-09 MED ORDER — PROCHLORPERAZINE EDISYLATE 10 MG/2ML IJ SOLN
10.0000 mg | Freq: Four times a day (QID) | INTRAMUSCULAR | Status: DC | PRN
  Administered 2024-01-09 (×2): 10 mg via INTRAVENOUS
  Filled 2024-01-09 (×3): qty 2

## 2024-01-09 MED ORDER — MAGNESIUM SULFATE 2 GM/50ML IV SOLN
2.0000 g | Freq: Once | INTRAVENOUS | Status: AC
  Administered 2024-01-09: 2 g via INTRAVENOUS
  Filled 2024-01-09: qty 50

## 2024-01-09 MED ORDER — SODIUM CHLORIDE 0.9 % IV BOLUS
2000.0000 mL | Freq: Once | INTRAVENOUS | Status: AC
  Administered 2024-01-09: 2000 mL via INTRAVENOUS

## 2024-01-09 MED ORDER — ACETAMINOPHEN 325 MG PO TABS: 650.0000 mg | ORAL_TABLET | Freq: Four times a day (QID) | ORAL | Status: DC | PRN

## 2024-01-09 MED ORDER — IOHEXOL 300 MG/ML  SOLN
100.0000 mL | Freq: Once | INTRAMUSCULAR | Status: AC | PRN
  Administered 2024-01-09: 100 mL via INTRAVENOUS

## 2024-01-09 MED ORDER — POTASSIUM CHLORIDE IN NACL 40-0.9 MEQ/L-% IV SOLN
INTRAVENOUS | Status: DC
  Filled 2024-01-09 (×2): qty 1000

## 2024-01-09 MED ORDER — FOLIC ACID 1 MG PO TABS
1.0000 mg | ORAL_TABLET | Freq: Every day | ORAL | Status: DC
  Administered 2024-01-10 – 2024-01-11 (×2): 1 mg via ORAL
  Filled 2024-01-09 (×2): qty 1

## 2024-01-09 MED ORDER — DOLUTEGRAVIR-LAMIVUDINE 50-300 MG PO TABS: 1.0000 | ORAL_TABLET | Freq: Every evening | ORAL | Status: DC

## 2024-01-09 MED ORDER — DEXTROSE 50 % IV SOLN
25.0000 g | Freq: Once | INTRAVENOUS | Status: DC
  Filled 2024-01-09: qty 50

## 2024-01-09 MED ORDER — PANTOPRAZOLE SODIUM 40 MG IV SOLR
40.0000 mg | Freq: Once | INTRAVENOUS | Status: AC
  Administered 2024-01-09: 40 mg via INTRAVENOUS
  Filled 2024-01-09: qty 10

## 2024-01-09 MED ORDER — POTASSIUM CHLORIDE 10 MEQ/100ML IV SOLN
10.0000 meq | Freq: Once | INTRAVENOUS | Status: AC
  Administered 2024-01-09: 10 meq via INTRAVENOUS
  Filled 2024-01-09: qty 100

## 2024-01-09 MED ORDER — DIAZEPAM 5 MG/ML IJ SOLN
2.5000 mg | Freq: Once | INTRAMUSCULAR | Status: AC
  Administered 2024-01-09: 2.5 mg via INTRAVENOUS
  Filled 2024-01-09: qty 2

## 2024-01-09 MED ORDER — VILAZODONE HCL 40 MG PO TABS
40.0000 mg | ORAL_TABLET | Freq: Every day | ORAL | Status: DC
Start: 2024-01-09 — End: 2024-01-09

## 2024-01-09 MED ORDER — DIAZEPAM 5 MG/ML IJ SOLN
5.0000 mg | Freq: Once | INTRAMUSCULAR | Status: AC
  Administered 2024-01-09: 5 mg via INTRAVENOUS
  Filled 2024-01-09: qty 2

## 2024-01-09 MED ORDER — ENOXAPARIN SODIUM 40 MG/0.4ML IJ SOSY: 40.0000 mg | PREFILLED_SYRINGE | INTRAMUSCULAR | Status: DC

## 2024-01-09 MED ORDER — ACETAMINOPHEN 650 MG RE SUPP: 650.0000 mg | Freq: Four times a day (QID) | RECTAL | Status: DC | PRN

## 2024-01-09 MED ORDER — FLUTICASONE FUROATE-VILANTEROL 100-25 MCG/ACT IN AEPB
1.0000 | INHALATION_SPRAY | Freq: Every day | RESPIRATORY_TRACT | Status: DC
  Administered 2024-01-09 – 2024-01-11 (×3): 1 via RESPIRATORY_TRACT
  Filled 2024-01-09: qty 28

## 2024-01-09 MED ORDER — DOLUTEGRAVIR-LAMIVUDINE 50-300 MG PO TABS
1.0000 | ORAL_TABLET | Freq: Every evening | ORAL | Status: DC
  Filled 2024-01-09: qty 1

## 2024-01-09 MED ORDER — ALPRAZOLAM 0.5 MG PO TABS
1.0000 mg | ORAL_TABLET | Freq: Three times a day (TID) | ORAL | Status: AC | PRN
  Administered 2024-01-09 – 2024-01-10 (×2): 1 mg via ORAL
  Filled 2024-01-09 (×2): qty 2

## 2024-01-09 MED ORDER — FLUCONAZOLE 100 MG PO TABS
100.0000 mg | ORAL_TABLET | Freq: Every day | ORAL | Status: DC
  Administered 2024-01-10 – 2024-01-11 (×2): 100 mg via ORAL
  Filled 2024-01-09 (×3): qty 1

## 2024-01-09 NOTE — ED Provider Notes (Signed)
 [place] [facility] EMERGENCY DEPARTMENT AT [facility] Provider Note   CSN: [id number] Arrival date & time: 01/09/24  9086     Patient presents with: Emesis   Lindsay French is a 43 y.o. female.   Patient complains of persistent vomiting.  She has been on Ozempic  for over a year.  She has diabetes hypertension  The history is provided by the patient and medical records. No language interpreter was used.  Emesis Severity:  Moderate Timing:  Constant Quality:  Unable to specify Progression:  Unchanged Chronicity:  New Recent urination:  Normal Context: post-tussive   Relieved by:  Nothing Worsened by:  Nothing Ineffective treatments:  None tried Associated symptoms: no abdominal pain, no cough, no diarrhea and no headaches   Risk factors: no alcohol use        Prior to Admission medications   Medication Sig Start Date End Date Taking? Authorizing Provider  albuterol  (VENTOLIN  HFA) 108 (90 Base) MCG/ACT inhaler INHALE 1 PUFF INTO THE LUNGS EVERY 6 HOURS AS NEEDED FOR WHEEZING OR SHORTNESS OF BREATH 06/05/22   Eben Reyes BROCKS, MD  ALPRAZolam  (XANAX ) 1 MG tablet Take 1 tablet (1 mg total) by mouth 3 (three) times daily as needed for anxiety. 08/28/23   Eben Reyes BROCKS, MD  budesonide -formoterol  (SYMBICORT ) 80-4.5 MCG/ACT inhaler Inhale 2 puffs into the lungs daily. Patient taking differently: Inhale 2 puffs into the lungs daily as needed (respiratory issues). 06/12/22   Eben Reyes BROCKS, MD  dolutegravir -lamiVUDine  ([name] ) 50-300 MG tablet Take 1 tablet by mouth every evening. 08/20/23   Eben Reyes BROCKS, MD  fluconazole  (DIFLUCAN ) 100 MG tablet Take 1 tablet (100 mg total) by mouth daily. 01/07/24 02/06/24  Eben Reyes BROCKS, MD  folic acid  (FOLVITE ) 1 MG tablet Take 1 tablet (1 mg total) by mouth daily. 06/23/23   Masters, Katie, DO  gabapentin  (NEURONTIN ) 300 MG capsule Take 2 capsules (600 mg total) by mouth 3 (three) times daily. 06/30/23   Eben Reyes BROCKS, MD   hydrOXYzine  (ATARAX ) 25 MG tablet Take 1 tablet (25 mg total) by mouth every 8 (eight) hours as needed. 11/13/23   Eben Reyes BROCKS, MD  Iron , Ferrous Sulfate , 325 (65 Fe) MG TABS Take 1 tablet by mouth daily. 07/30/23 10/28/23  Eben Reyes BROCKS, MD  methocarbamol  (ROBAXIN ) 500 MG tablet Take 500 mg by mouth every 6 (six) hours as needed for muscle spasms. 06/14/23   [provider]  oxyCODONE  (ROXICODONE ) 5 MG immediate release tablet Take 1 tablet (5 mg total) by mouth every 4 (four) hours as needed. 06/11/23   [place], Jesse J, PA-C  pantoprazole  (PROTONIX ) 40 MG tablet Take 1 tablet (40 mg total) by mouth daily. 08/19/23   Eben Reyes BROCKS, MD  polyethylene glycol (MIRALAX  / GLYCOLAX ) 17 g packet Take 17 g by mouth daily as needed for moderate constipation. 06/22/23   Masters, Katie, DO  Semaglutide , 1 MG/DOSE, 4 MG/3ML SOPN Inject 0.75 mg into the skin once a week. 12/09/23   Eben Reyes BROCKS, MD  tiZANidine  (ZANAFLEX ) 4 MG tablet Take 1 tablet (4 mg total) by mouth daily. 12/03/23 12/02/24  Eben Reyes BROCKS, MD  Vilazodone  HCl (VIIBRYD ) 40 MG TABS TAKE 1 TABLET BY MOUTH EVERY DAY 07/17/23   Eben Reyes BROCKS, MD    Allergies: Hydrocodone , Latex, Mucinex  fast-max chest cong ms [guaifenesin ], and Ancef  [cefazolin ]    Review of Systems  Constitutional:  Negative for appetite change and fatigue.  HENT:  Negative for congestion, ear discharge and  sinus pressure.   Eyes:  Negative for discharge.  Respiratory:  Negative for cough.   Cardiovascular:  Negative for chest pain.  Gastrointestinal:  Positive for vomiting. Negative for abdominal pain and diarrhea.  Genitourinary:  Negative for frequency and hematuria.  Musculoskeletal:  Negative for back pain.  Skin:  Negative for rash.  Neurological:  Negative for seizures and headaches.  Psychiatric/Behavioral:  Negative for hallucinations.     Updated Vital Signs BP (!) 149/98   Pulse (!) 117   Temp 98 F (36.7 C) (Oral)   Resp 17    SpO2 96%   Physical Exam Vitals and nursing note reviewed.  Constitutional:      Appearance: She is well-developed.  HENT:     Head: Normocephalic.     Nose: Nose normal.  Eyes:     General: No scleral icterus.    Conjunctiva/sclera: Conjunctivae normal.  Neck:     Thyroid: No thyromegaly.  Cardiovascular:     Rate and Rhythm: Normal rate and regular rhythm.     Heart sounds: No murmur heard.    No friction rub. No gallop.  Pulmonary:     Breath sounds: No stridor. No wheezing or rales.  Chest:     Chest wall: No tenderness.  Abdominal:     General: There is no distension.     Tenderness: There is no abdominal tenderness. There is no rebound.  Musculoskeletal:        General: Normal range of motion.     Cervical back: Neck supple.  Lymphadenopathy:     Cervical: No cervical adenopathy.  Skin:    Findings: No erythema or rash.  Neurological:     Mental Status: She is alert and oriented to person, place, and time.     Motor: No abnormal muscle tone.     Coordination: Coordination normal.  Psychiatric:        Behavior: Behavior normal.     (all labs ordered are listed, but only abnormal results are displayed) Labs Reviewed  CBC - Abnormal; Notable for the following components:      Result Value   RDW 17.7 (*)    All other components within normal limits  COMPREHENSIVE METABOLIC PANEL WITH GFR - Abnormal; Notable for the following components:   Potassium 3.3 (*)    Chloride 97 (*)    CO2 18 (*)    Glucose, Bld 127 (*)    BUN <5 (*)    AST 195 (*)    ALT 153 (*)    Total Bilirubin 1.4 (*)    Anion gap 24 (*)    All other components within normal limits  MAGNESIUM  - Abnormal; Notable for the following components:   Magnesium  0.8 (*)    All other components within normal limits  BETA-HYDROXYBUTYRIC ACID  BLOOD GAS, VENOUS  URINALYSIS, ROUTINE W REFLEX MICROSCOPIC  URINE DRUG SCREEN  PHOSPHORUS    EKG: None  Radiology: CT ABDOMEN PELVIS W  CONTRAST Result Date: 01/09/2024 CLINICAL DATA:  Acute generalized abdominal pain. EXAM: CT ABDOMEN AND PELVIS WITH CONTRAST TECHNIQUE: Multidetector CT imaging of the abdomen and pelvis was performed using the standard protocol following bolus administration of intravenous contrast. RADIATION DOSE REDUCTION: This exam was performed according to the departmental dose-optimization program which includes automated exposure control, adjustment of the mA and/or kV according to patient size and/or use of iterative reconstruction technique. CONTRAST:  OMNIPAQUE  IOHEXOL  300 MG/ML  SOLN COMPARISON:  None Available. FINDINGS: Lower chest: No acute  abnormality. Hepatobiliary: Hepatic steatosis. No cholelithiasis or biliary dilatation. Pancreas: Unremarkable. No pancreatic ductal dilatation or surrounding inflammatory changes. Spleen: Normal in size without focal abnormality. Adrenals/Urinary Tract: Adrenal glands appear normal. Small nonobstructive calculus seen in lower pole collecting system of right kidney. No hydronephrosis or renal obstruction is noted. Urinary bladder is decompressed. Stomach/Bowel: Stomach is within normal limits. Appendix appears normal. No evidence of bowel wall thickening, distention, or inflammatory changes. Vascular/Lymphatic: No significant vascular findings are present. No enlarged abdominal or pelvic lymph nodes. Reproductive: Uterus and bilateral adnexa are unremarkable. Other: No abdominal wall hernia or abnormality. No abdominopelvic ascites. Musculoskeletal: No acute or significant osseous findings. IMPRESSION: 1. Hepatic steatosis. 2. Small nonobstructive right renal calculus. 3. No acute abnormality seen in the abdomen or pelvis. Electronically Signed   By: Lynwood Landy Raddle M.D.   On: 01/09/2024 13:24     Procedures   Medications Ordered in the ED  LORazepam  (ATIVAN ) injection 0.5 mg (has no administration in time range)  ondansetron  (ZOFRAN ) injection 4 mg (has no  administration in time range)  potassium chloride  10 mEq in 100 mL IVPB (has no administration in time range)  sodium chloride  0.9 % bolus 2,000 mL (0 mLs Intravenous Stopped 01/09/24 1311)  ondansetron  (ZOFRAN ) injection 4 mg (4 mg Intravenous Given 01/09/24 1010)  pantoprazole  (PROTONIX ) injection 40 mg (40 mg Intravenous Given 01/09/24 1010)  magnesium  sulfate IVPB 2 g 50 mL (0 g Intravenous Stopped 01/09/24 1243)  metoCLOPramide  (REGLAN ) injection 10 mg (10 mg Intravenous Given 01/09/24 1140)  iohexol  (OMNIPAQUE ) 300 MG/ML solution 100 mL (100 mLs Intravenous Contrast Given 01/09/24 1231)  ondansetron  (ZOFRAN ) injection 4 mg (4 mg Intravenous Given 01/09/24 1321)                                    Medical Decision Making Amount and/or Complexity of Data Reviewed Labs: ordered. Radiology: ordered.  Risk Prescription drug management. Decision regarding hospitalization.   Persistent vomiting and hypomagnesia with acidosis.  Patient will be admitted to medicine     Final diagnoses:  Hypomagnesemia  Uncontrollable vomiting    ED Discharge Orders     None          Suzette Pac, MD 01/09/24 1740

## 2024-01-09 NOTE — ED Triage Notes (Signed)
 Pt from home via [facility] with reports of dizziness, weakness, and emesis. Pt reports taking her ozempic  Tuesday and since that time she has been unable to eat.

## 2024-01-09 NOTE — ED Notes (Signed)
 Patient transported to CT

## 2024-01-09 NOTE — H&P (Signed)
 History and Physical    Patient: Lindsay French FMW:[id number] DOB: 1980/11/27 DOA: 01/09/2024 DOS: the patient was seen and examined on 01/09/2024 PCP: Eben Reyes BROCKS, MD  Patient coming from: Home  Chief Complaint:  Chief Complaint  Patient presents with   Emesis   HPI: Lindsay French is a 43 y.o. female with medical history significant of ADHD, anxiety, depression, asthma, fatty liver disease, elevated LFT, GERD, history of heatstroke with syncopal episode, thrombocytopenia before HIV diagnosis, HIV on antiretroviral therapy, hyperlipidemia, hypertension, hypothyroidism, class III obesity, polycystic ovarian syndrome, history of pneumonia, diagnosed with AVN on 02/2020 who presented to the emergency department with complaints of abdominal pain, nausea, numerous episodes of emesis after using Ozempic  early morning yesterday.  No diarrhea, constipation, melena or hematochezia.  No flank pain, dysuria, frequency or hematuria.  She denied fever, chills, rhinorrhea, sore throat, wheezing or hemoptysis.  No chest pain, palpitations, diaphoresis, PND, orthopnea or pitting edema of the lower extremities.  No polyuria, polydipsia, polyphagia or blurred vision.   Lab work: CBC showed a white count of 9.3, hemoglobin 14.0 g/dL and platelets 803.  Magnesium  was 0.8 mg/dL.  Beta hydroxybutyric acid 0.43 mmol/L.  Venous blood gas showed a pH of 7.43, pCO2 31 and pO2 of 53 mmHg.  Bicarbonate was 20.6.  CMP showed sodium 139, potassium 3.3, chloride 97 and CO2 18 mmol/L with an anion gap of 24.  Glucose 127, BUN less than 5, creatinine 0.65 and total bilirubin 1.4 mg/dL.  AST was 195 and ALT 153 units/L.  Total protein, albumin and alkaline phosphatase were normal.  Imaging: CT abdomen/pelvis with contrast showed hepatic asteatosis.  Small nonobstructive right renal calculus.  ED course: Initial vital signs were temperature 98.1 F, pulse 103, respirations 22, BP 136/86 mmHg O2 sat 96% on room air.  The patient  received 2000 mL of normal saline bolus, pantoprazole  40 mg IVP x 1, ondansetron  4 mg IVP x 3, metoclopramide  10 mg IVP, lorazepam  0.5 mg IVP, magnesium  sulfate 2 g IVPB and KCl 10 mEq IVP x 1.  I added another 2 g of magnesium  sulfate.   Review of Systems: As mentioned in the history of present illness. All other systems reviewed and are negative. Past Medical History:  Diagnosis Date   ADHD    Anxiety    Asthma    usually with colds   Avascular necrosis (HCC)    Back pain    Depression    Diabetes mellitus without complication (HCC)    Elevated LFTs    Fatty liver 04/16/2016   Noted US  ABD   GERD (gastroesophageal reflux disease)    Heart murmur    at birth, no issues   History of heat stroke    with syncope   History of thrombocytopenia    prior to diagnosis HIV   HIV (human immunodeficiency virus infection) (HCC)    Hyperlipidemia    Hypertension    Hypothyroidism    Infertility, female    Obese    PCOS (polycystic ovarian syndrome)    Pneumonia 04/17/2016   SOB (shortness of breath)    Wears partial dentures    upper   Wrist fracture 11/2017   Left   Past Surgical History:  Procedure Laterality Date   BIOPSY  06/22/2023   Procedure: BIOPSY;  Surgeon: Shila Gustav GAILS, MD;  Location: [facility];  Service: Gastroenterology;;   ESOPHAGOGASTRODUODENOSCOPY (EGD) WITH PROPOFOL  N/A 06/22/2023   Procedure: ESOPHAGOGASTRODUODENOSCOPY (EGD) WITH PROPOFOL ;  Surgeon: Shila Gustav GAILS, MD;  Location: [facility];  Service: Gastroenterology;  Laterality: N/A;   HYSTEROSCOPY N/A 12/31/2017   Procedure: HYSTEROSCOPY and polypectomy;  Surgeon: Yalcinkaya, Tamer, MD;  Location[facility];  Service: Gynecology;  Laterality: N/A;   I & D EXTREMITY Left 07/15/2020   Procedure: PARTIAL EXCISION LEFT TIBIA;  Surgeon: Harden Jerona GAILS, MD;  Location: [facility] OR;  Service: Orthopedics;  Laterality: Left;   IR FLUORO GUIDE CV LINE LEFT  09/26/2020   IR RADIOLOGIST EVAL & MGMT   08/12/2020   maxillofacial surgery     OPEN REDUCTION INTERNAL FIXATION (ORIF) FOOT LISFRANC FRACTURE Left 06/11/2023   Procedure: OPEN TREAMENT OF LEFT FIRST, SECOND, FOURTH METATARSALS, MEDIAL CUNEIFORM FRACTURE, CUBOID, FIRST SECOND THRID TARSOMETATARSAL AND LISFRANC JOINT;  Surgeon: Elsa Lonni SAUNDERS, MD;  Location: [facility] OR;  Service: Orthopedics;  Laterality: Left;   Social History:  reports that she has quit smoking. Her smoking use included cigarettes. She has a 4.5 pack-year smoking history. She has never used smokeless tobacco. She reports current alcohol use of about 7.0 standard drinks of alcohol per week. She reports that she does not currently use drugs after having used the following drugs: Crack cocaine.  Allergies  Allergen Reactions   Hydrocodone  Itching   Latex Other (See Comments)    As a teenager when using latex condoms she would get a yeast infection.    Mucinex  Fast-Max Chest Cong Ms [Guaifenesin ] Nausea And Vomiting   Ancef  [Cefazolin ] Rash    Family History  Problem Relation Age of Onset   Thyroid disease Mother    Hypothyroidism Mother    Depression Mother    Obesity Mother    Obesity Father    Depression Father    Hyperlipidemia Father    Hypertension Father     Prior to Admission medications   Medication Sig Start Date End Date Taking? Authorizing Provider  albuterol  (VENTOLIN  HFA) 108 (90 Base) MCG/ACT inhaler INHALE 1 PUFF INTO THE LUNGS EVERY 6 HOURS AS NEEDED FOR WHEEZING OR SHORTNESS OF BREATH 06/05/22   Eben Reyes BROCKS, MD  ALPRAZolam  (XANAX ) 1 MG tablet Take 1 tablet (1 mg total) by mouth 3 (three) times daily as needed for anxiety. 08/28/23   Eben Reyes BROCKS, MD  budesonide -formoterol  (SYMBICORT ) 80-4.5 MCG/ACT inhaler Inhale 2 puffs into the lungs daily. Patient taking differently: Inhale 2 puffs into the lungs daily as needed (respiratory issues). 06/12/22   Eben Reyes BROCKS, MD  dolutegravir -lamiVUDine  ([name] ) 50-300 MG tablet Take 1  tablet by mouth every evening. 08/20/23   Eben Reyes BROCKS, MD  fluconazole  (DIFLUCAN ) 100 MG tablet Take 1 tablet (100 mg total) by mouth daily. 01/07/24 02/06/24  Eben Reyes BROCKS, MD  folic acid  (FOLVITE ) 1 MG tablet Take 1 tablet (1 mg total) by mouth daily. 06/23/23   Masters, Katie, DO  gabapentin  (NEURONTIN ) 300 MG capsule Take 2 capsules (600 mg total) by mouth 3 (three) times daily. 06/30/23   Eben Reyes BROCKS, MD  hydrOXYzine  (ATARAX ) 25 MG tablet Take 1 tablet (25 mg total) by mouth every 8 (eight) hours as needed. 11/13/23   Eben Reyes BROCKS, MD  Iron , Ferrous Sulfate , 325 (65 Fe) MG TABS Take 1 tablet by mouth daily. 07/30/23 10/28/23  Eben Reyes BROCKS, MD  methocarbamol  (ROBAXIN ) 500 MG tablet Take 500 mg by mouth every 6 (six) hours as needed for muscle spasms. 06/14/23   [provider]  oxyCODONE  (ROXICODONE ) 5 MG immediate release tablet Take 1 tablet (5 mg total) by mouth every  4 (four) hours as needed. 06/11/23   [place], Jesse J, PA-C  pantoprazole  (PROTONIX ) 40 MG tablet Take 1 tablet (40 mg total) by mouth daily. 08/19/23   Eben Reyes BROCKS, MD  polyethylene glycol (MIRALAX  / GLYCOLAX ) 17 g packet Take 17 g by mouth daily as needed for moderate constipation. 06/22/23   Masters, Katie, DO  Semaglutide , 1 MG/DOSE, 4 MG/3ML SOPN Inject 0.75 mg into the skin once a week. 12/09/23   Eben Reyes BROCKS, MD  tiZANidine  (ZANAFLEX ) 4 MG tablet Take 1 tablet (4 mg total) by mouth daily. 12/03/23 12/02/24  Eben Reyes BROCKS, MD  Vilazodone  HCl (VIIBRYD ) 40 MG TABS TAKE 1 TABLET BY MOUTH EVERY DAY 07/17/23   Eben Reyes BROCKS, MD    Physical Exam: Vitals:   01/09/24 0922 01/09/24 1130 01/09/24 1322  BP: 136/86 (!) 136/91 (!) 149/98  Pulse: (!) 103 (!) 120 (!) 117  Resp: (!) 22 17 17   Temp: 98.1 F (36.7 C)  98 F (36.7 C)  TempSrc:   Oral  SpO2: 96% 99% 96%   Physical Exam Vitals and nursing note reviewed.  Constitutional:      General: She is awake. She is not in acute  distress.    Appearance: She is ill-appearing.  HENT:     Head: Normocephalic.     Nose: No rhinorrhea.     Mouth/Throat:     Mouth: Mucous membranes are moist.  Eyes:     General: No scleral icterus.    Pupils: Pupils are equal, round, and reactive to light.  Neck:     Vascular: No JVD.  Cardiovascular:     Rate and Rhythm: Normal rate and regular rhythm.     Heart sounds: S1 normal and S2 normal.  Pulmonary:     Effort: Pulmonary effort is normal.     Breath sounds: Normal breath sounds. No wheezing, rhonchi or rales.  Abdominal:     General: Bowel sounds are normal. There is no distension.     Palpations: Abdomen is soft.     Tenderness: There is no abdominal tenderness. There is no right CVA tenderness or left CVA tenderness.  Musculoskeletal:     Cervical back: Neck supple.     Right lower leg: No edema.     Left lower leg: No edema.  Skin:    General: Skin is warm and dry.  Neurological:     General: No focal deficit present.     Mental Status: She is alert and oriented to person, place, and time.  Psychiatric:        Mood and Affect: Mood normal.        Behavior: Behavior normal. Behavior is cooperative.     Data Reviewed:  Results are pending, will review when available.  EKG: Vent. rate 100 BPM PR interval 133 ms QRS duration 101 ms QT/QTcB 425/549 ms P-R-T axes 52 77 54 Sinus tachycardia Inferior infarct, old Prolonged QT interval  Assessment and Plan: Principal Problem:   Hypomagnesemia Associated:   Hypokalemia Resulting in:   Prolonged QT interval Observation/telemetry. Continue IV fluids. Follow CBC, CMP in AM. Avoid QT prolonging meds as possible. KCl supplementation. Magnesium  sulfate 2 g IVPB x 2 given. Keep electrolytes optimized. Check EKG in the morning.  Active Problems:   GERD (gastroesophageal reflux disease) In the setting of:   Intractable nausea and vomiting Associated with:   Abdominal pain Keep n.p.o. for now. IVF  with KCl supplementation. Advance diet as tolerated. Analgesics as needed.  Antiemetics as needed. Pantoprazole  40 mg IVP daily.    Idiopathic thrombocytopenic purpura (ITP) (HCC) Will use SCDs for DVT prophylaxis.    Class 3 obesity Awaiting weight and BMI update.    Hypertension Antihypertensives as needed.    Hyperlipidemia Follow-up with primary care provider.    HIV disease (HCC) Continue antiretroviral medication. Follow-up with Dr. Eben as an outpatient.    Hepatic steatosis Would benefit from further weight loss. Monitor transaminases.    Depression with anxiety Continue hydroxyzine  as needed. Continue vilazodone  40 mg p.o. daily.      Advance Care Planning:   Code Status: Full Code   Consults:   Family Communication:   Severity of Illness: The appropriate patient status for this patient is OBSERVATION. Observation status is judged to be reasonable and necessary in order to provide the required intensity of service to ensure the patient's safety. [name] presenting symptoms, physical exam findings, and initial radiographic and laboratory data in the context of their medical condition is felt to place them at decreased risk for further clinical deterioration. Furthermore, it is anticipated that the patient will be medically stable for discharge from the hospital within 2 midnights of admission.   Author: Alm Dorn Castor, MD 01/09/2024 1:56 PM  For on call review [email or web address].   This document was prepared using Dragon voice recognition software and may contain some unintended transcription errors.

## 2024-01-10 DIAGNOSIS — E876 Hypokalemia: Secondary | ICD-10-CM | POA: Diagnosis not present

## 2024-01-10 DIAGNOSIS — F32A Depression, unspecified: Secondary | ICD-10-CM | POA: Diagnosis not present

## 2024-01-10 DIAGNOSIS — K219 Gastro-esophageal reflux disease without esophagitis: Secondary | ICD-10-CM | POA: Diagnosis not present

## 2024-01-10 LAB — CBC
HCT: 39.2 % (ref 36.0–46.0)
Hemoglobin: 11.8 g/dL — ABNORMAL LOW (ref 12.0–15.0)
MCH: 30.9 pg (ref 26.0–34.0)
MCHC: 30.1 g/dL (ref 30.0–36.0)
MCV: 102.6 fL — ABNORMAL HIGH (ref 80.0–100.0)
Platelets: 166 K/uL (ref 150–400)
RBC: 3.82 MIL/uL — ABNORMAL LOW (ref 3.87–5.11)
RDW: 18.8 % — ABNORMAL HIGH (ref 11.5–15.5)
WBC: 9 K/uL (ref 4.0–10.5)
nRBC: 0 % (ref 0.0–0.2)

## 2024-01-10 LAB — COMPREHENSIVE METABOLIC PANEL WITH GFR
ALT: 102 U/L — ABNORMAL HIGH (ref 0–44)
AST: 98 U/L — ABNORMAL HIGH (ref 15–41)
Albumin: 3.4 g/dL — ABNORMAL LOW (ref 3.5–5.0)
Alkaline Phosphatase: 93 U/L (ref 38–126)
Anion gap: 14 (ref 5–15)
BUN: <5 mg/dL — ABNORMAL LOW (ref 6–20)
CO2: 22 mmol/L (ref 22–32)
Calcium: 7.7 mg/dL — ABNORMAL LOW (ref 8.9–10.3)
Chloride: 104 mmol/L (ref 98–111)
Creatinine, Ser: 0.54 mg/dL (ref 0.44–1.00)
GFR, Estimated: >60 mL/min (ref >60)
Glucose, Bld: 96 mg/dL (ref 70–99)
Potassium: 3.2 mmol/L — ABNORMAL LOW (ref 3.5–5.1)
Sodium: 140 mmol/L (ref 135–145)
Total Bilirubin: 1.5 mg/dL — ABNORMAL HIGH (ref 0.0–1.2)
Total Protein: 5.8 g/dL — ABNORMAL LOW (ref 6.5–8.1)

## 2024-01-10 LAB — MAGNESIUM: Magnesium: 1.9 mg/dL (ref 1.7–2.4)

## 2024-01-10 LAB — PHOSPHORUS: Phosphorus: 3.5 mg/dL (ref 2.5–4.6)

## 2024-01-10 MED ORDER — BENZONATATE 100 MG PO CAPS
100.0000 mg | ORAL_CAPSULE | Freq: Two times a day (BID) | ORAL | Status: DC
Start: 2024-01-10 — End: 2024-01-11
  Administered 2024-01-10 – 2024-01-11 (×2): 100 mg via ORAL
  Filled 2024-01-10 (×2): qty 1

## 2024-01-10 MED ORDER — ENOXAPARIN SODIUM 40 MG/0.4ML IJ SOSY: 40.0000 mg | PREFILLED_SYRINGE | INTRAMUSCULAR | Status: DC

## 2024-01-10 MED ORDER — NYSTATIN 100000 UNIT/ML MT SUSP
5.0000 mL | Freq: Four times a day (QID) | OROMUCOSAL | Status: DC
  Administered 2024-01-10 – 2024-01-11 (×3): 500000 [IU] via ORAL
  Filled 2024-01-10 (×3): qty 5

## 2024-01-10 MED ORDER — POTASSIUM CHLORIDE CRYS ER 20 MEQ PO TBCR
40.0000 meq | EXTENDED_RELEASE_TABLET | Freq: Once | ORAL | Status: AC
  Administered 2024-01-10: 40 meq via ORAL
  Filled 2024-01-10: qty 2

## 2024-01-10 MED ORDER — MAGNESIUM SULFATE IN D5W 1-5 GM/100ML-% IV SOLN
1.0000 g | Freq: Once | INTRAVENOUS | Status: AC
  Administered 2024-01-10: 1 g via INTRAVENOUS
  Filled 2024-01-10: qty 100

## 2024-01-10 MED ORDER — PANTOPRAZOLE SODIUM 40 MG PO TBEC
40.0000 mg | DELAYED_RELEASE_TABLET | Freq: Every day | ORAL | Status: DC
  Administered 2024-01-10 – 2024-01-11 (×2): 40 mg via ORAL
  Filled 2024-01-10 (×2): qty 1

## 2024-01-10 MED ORDER — LITHIUM CARBONATE 150 MG PO CAPS
150.0000 mg | ORAL_CAPSULE | Freq: Three times a day (TID) | ORAL | Status: DC
  Administered 2024-01-10 – 2024-01-11 (×4): 150 mg via ORAL
  Filled 2024-01-10 (×4): qty 1

## 2024-01-10 MED ORDER — K PHOS MONO-SOD PHOS DI & MONO 155-852-130 MG PO TABS: 500.0000 mg | ORAL_TABLET | Freq: Two times a day (BID) | ORAL | Status: DC

## 2024-01-10 MED ORDER — FLUOXETINE HCL 20 MG PO CAPS
20.0000 mg | ORAL_CAPSULE | Freq: Every day | ORAL | Status: DC
  Administered 2024-01-10: 20 mg via ORAL
  Filled 2024-01-10: qty 1

## 2024-01-10 NOTE — Progress Notes (Signed)
 Patient placed on 2 Lpm nasal cannula while sleeping due to sats dropping down to 80%. Woke patient up and SATs returned to 94%. RN and MD made aware.

## 2024-01-10 NOTE — Plan of Care (Signed)

## 2024-01-10 NOTE — TOC Initial Note (Signed)
 Transition of Care [facility]) - Initial/Assessment Note    Patient Details  Name: Lindsay French MRN: [id number] Date of Birth: [birth year]  [facility] CM/SW Contact:    Doneta Glenys DASEN, RN Phone Number: 01/10/2024, 12:22 PM  Clinical Narrative:                 Presented for dizziness, weakness, and emesis. PTA lives in a house with spouse [name];PCP/insurance verified;denies DME,HH,oxygen and SDOH needs;Randall will transport at discharge. IP CM will follow for DME oxygen need.   Expected Discharge Plan: Home/Self Care Barriers to Discharge: Continued Medical Work up   Patient Goals and CMS Choice Patient states their goals for this hospitalization and ongoing recovery are:: Home CMS Medicare.gov Compare Post Acute Care list provided to:: Other (Comment Required) (NA) Choice offered to / list presented to : NA [place] ownership interest in [facility].provided to:: Parent NA    Expected Discharge Plan and Services In-house Referral: NA Discharge Planning Services: CM Consult Post Acute Care Choice: NA Living arrangements for the past 2 months: Single Family Home                 DME Arranged: N/A DME Agency: NA       HH Arranged: NA HH Agency: NA        Prior Living Arrangements/Services Living arrangements for the past 2 months: Single Family Home Lives with:: Spouse Patient language and need for interpreter reviewed:: Yes Do you feel safe going back to the place where you live?: Yes      Need for Family Participation in Patient Care: No (Comment) Care giver support system in place?: Yes (comment) Current home services:  (NA) Criminal Activity/Legal Involvement Pertinent to Current Situation/Hospitalization: No - Comment as needed  Activities of Daily Living      Permission Sought/Granted Permission sought to share information with : Case Manager Permission granted to share information with : Yes, Verbal Permission Granted  Share  Information with NAME: Mikkelsen,Randall (Spouse)  [phone]           Emotional Assessment Appearance:: Appears stated age Attitude/Demeanor/Rapport: Engaged Affect (typically observed): Appropriate Orientation: : Oriented to Self, Oriented to Place, Oriented to  Time, Oriented to Situation Alcohol / Substance Use: Not Applicable Psych Involvement: No (comment)  Admission diagnosis:  Uncontrollable vomiting [R11.10] Hypomagnesemia [E83.42] Patient Active Problem List   Diagnosis Date Noted   Hypomagnesemia 01/09/2024   Prolonged QT interval 01/09/2024   Abdominal pain 01/09/2024   Hypokalemia 01/09/2024   Anxiety    Acute gastric ulcer with hemorrhage 06/22/2023   Gastritis and gastroduodenitis 06/22/2023   Upper GI bleed 06/21/2023   Morbid obesity (HCC) 06/21/2023   Closed fracture dislocation foot with nonunion 06/21/2023   Iron  deficiency anemia due to chronic blood loss 06/21/2023   Melena 06/21/2023   Lisfranc dislocation, left, initial encounter 05/26/2023   Diabetic foot infection (HCC) 04/30/2023   Insomnia 04/30/2023   Thrush 06/12/2022   Depression with anxiety 01/29/2022   At risk for heart disease 09/20/2021   Folate deficiency 08/25/2021   Vitamin D  deficiency 08/25/2021   Hyperlipidemia associated with type 2 diabetes mellitus (HCC) 08/25/2021   Type 2 diabetes mellitus with hyperglycemia, without long-term current use of insulin  (HCC) 08/25/2021   Hypertension associated with type 2 diabetes mellitus (HCC) 08/25/2021   At risk for hyperglycemia 08/25/2021   Dental infection 10/21/2020   Left upper extremity swelling 10/21/2020   Intractable nausea and vomiting 10/12/2020   Chronic  narcotic use 08/30/2020   Arthralgia of left lower leg    Cellulitis of left leg    Severe protein-calorie malnutrition (HCC)    Hypothyroidism    GERD (gastroesophageal reflux disease)    Hepatic steatosis    Thrombocytosis    Macrocytic anemia    Back pain 03/24/2019    Infertility, female 03/24/2019   PCOS (polycystic ovarian syndrome) 03/24/2019   HIV disease (HCC) 04/24/2018   Health care maintenance 04/24/2018   Hyperlipidemia 04/10/2017   Acute respiratory failure with hypoxia (HCC) 04/16/2016   Acute bronchitis 04/16/2016   Hypertension 04/16/2016   Class 3 obesity 03/21/2016   Screening examination for venereal disease 03/30/2014   Encounter for long-term (current) use of medications 03/30/2014   Idiopathic thrombocytopenic purpura (ITP) (HCC) 11/14/2010   ADHD (attention deficit hyperactivity disorder) 11/14/2010   PCP:  Eben Reyes BROCKS, MD Pharmacy:   [facility] [phone] - [place], [place] - [address] AT [facility] [address] [place] [address] Phone: [phone] Fax: [phone]     Social Drivers of Health (SDOH) Social History: SDOH Screenings   Food Insecurity: No Food Insecurity (01/10/2024)  Housing: Low Risk  (01/10/2024)  Transportation Needs: No Transportation Needs (01/10/2024)  Recent Concern: Transportation Needs - Unmet Transportation Needs (11/16/2023)   Received from [facility]  Utilities: Not At Risk (01/10/2024)  Alcohol Screen: Low Risk  (07/31/2022)  Depression (PHQ2-9): High Risk (12/03/2023)  Financial Resource Strain: Low Risk  (07/31/2022)  Physical Activity: Sufficiently Active (07/31/2022)  Social Connections: Moderately Integrated (07/31/2022)  Recent Concern: Social Connections - Moderately Isolated (06/12/2022)  Stress: No Stress Concern Present (07/31/2022)  Tobacco Use: Medium Risk (01/09/2024)   SDOH Interventions: Food Insecurity Interventions: Intervention Not Indicated Housing Interventions: Intervention Not Indicated Transportation Interventions: Intervention Not Indicated Utilities Interventions: Intervention Not Indicated   Readmission Risk Interventions     No data to display

## 2024-01-10 NOTE — Plan of Care (Signed)

## 2024-01-10 NOTE — Progress Notes (Signed)
 PROGRESS NOTE    Lindsay French  FMW:[id number] DOB: 11/06/1980 DOA: 01/09/2024 PCP: Eben Reyes BROCKS, MD   Brief Narrative: -year-old with past medical history significant for ADHD, anxiety, depression, asthma, fatty liver, elevated liver function test, GERD, history of heatstroke, syncopal episode, thrombocytopenia, HIV hyperlipidemia, hypertension, hypothyroidism, class III obesity, polycystic ovarian syndrome, pneumonia who presents with nausea vomiting after using Ozempic .  She Was found to have multiple electrolyte abnormalities, hypomagnesemia hypophosphatemia hypokalemia.  Admitted for further management   Assessment & Plan:   Principal Problem:   Hypomagnesemia Active Problems:   Idiopathic thrombocytopenic purpura (ITP) (HCC)   Class 3 obesity   Hypertension   Hyperlipidemia   HIV disease (HCC)   GERD (gastroesophageal reflux disease)   Hepatic steatosis   Intractable nausea and vomiting   Depression with anxiety   Prolonged QT interval   Abdominal pain   Hypokalemia  1-Hypomagnesemia replace IV.  Presented with a magnesium  of 0.8.  Magnesium  has increased to 1.9  Hypokalemia replace with 40 mEq x 2 for today.  Long QT in the setting of hypomagnesemia and hypokalemia.  It has improved.  Will resume lithium  today.  Will check EKG in the morning  Abdominal pain, nausea vomiting: In the setting of Ozempic .  Continue Protonix .  As needed antiemetics. She tolerated full liquid diet.  Plan to advance diet to carb modified today.  Home tomorrow if tolerating diet  Idiopathic thrombocytopenia purpura Hypertension Hyperlipidemia need to follow-up with PCP  HIV: Continue with antiretroviral medication  Steatosis: Liver function test trending down  Depression with anxiety continue Atarax  and Vilazodone    Estimated body mass index is 47.52 kg/m as calculated from the following:   Height as of 12/03/23: 5' 2 (1.575 m).   Weight as of 12/03/23: 117.8 kg.   DVT  prophylaxis: Lovenox  Code Status: Full code Family Communication: care discussed with patient Disposition Plan:  Status is: Observation The patient remains OBS appropriate and will d/c before 2 midnights.     Antimicrobials:    Subjective: She has been tolerating full liquid diet. Her vomiting after after she took ozempic .   Objective: Vitals:   01/10/24 0513 01/10/24 0941 01/10/24 0947 01/10/24 1255  BP: 118/72   126/82  Pulse: (!) 101   86  Resp: 20   17  Temp: 98.2 F (36.8 C)   98.3 F (36.8 C)  TempSrc: Oral     SpO2: 97% 94% (!) 80% 98%    Intake/Output Summary (Last 24 hours) at 01/10/2024 1348 Last data filed at 01/09/2024 1533 Gross per 24 hour  Intake 41.26 ml  Output --  Net 41.26 ml   There were no vitals filed for this visit.  Examination:  General exam: Appears calm and comfortable  Respiratory system: Clear to auscultation. Respiratory effort normal. Cardiovascular system: S1 & S2 heard, RRR.  Gastrointestinal system: Abdomen is nondistended, soft and nontender. No organomegaly or masses felt. Normal bowel sounds heard. Central nervous system: Alert and oriented. No focal neurological deficits. Extremities: Symmetric 5 x 5 power.     Data Reviewed: I have personally reviewed following labs and imaging studies  CBC: Recent Labs  Lab 01/09/24 0933 01/10/24 0519  WBC 9.3 9.0  HGB 14.0 11.8*  HCT 43.3 39.2  MCV 96.2 102.6*  PLT 196 166   Basic Metabolic Panel: Recent Labs  Lab 01/09/24 0933 01/09/24 1814 01/10/24 0519  NA 139  --  140  K 3.3*  --  3.2*  CL 97*  --  104  CO2 18*  --  22  GLUCOSE 127*  --  96  BUN <5*  --  <5*  CREATININE 0.65  --  0.54  CALCIUM  9.1  --  7.7*  MG 0.8*  --  1.9  PHOS  --  2.0* 3.5   GFR: CrCl cannot be calculated (Unknown ideal weight.). Liver Function Tests: Recent Labs  Lab 01/09/24 0933 01/10/24 0519  AST 195* 98*  ALT 153* 102*  ALKPHOS 114 93  BILITOT 1.4* 1.5*  PROT 7.2 5.8*   ALBUMIN 4.0 3.4*   No results for input(s): LIPASE, AMYLASE in the last 168 hours. No results for input(s): AMMONIA in the last 168 hours. Coagulation Profile: No results for input(s): INR, PROTIME in the last 168 hours. Cardiac Enzymes: No results for input(s): CKTOTAL, CKMB, CKMBINDEX, TROPONINI in the last 168 hours. BNP (last 3 results) No results for input(s): PROBNP in the last 8760 hours. HbA1C: No results for input(s): HGBA1C in the last 72 hours. CBG: No results for input(s): GLUCAP in the last 168 hours. Lipid Profile: No results for input(s): CHOL, HDL, LDLCALC, TRIG, CHOLHDL, LDLDIRECT in the last 72 hours. Thyroid Function Tests: No results for input(s): TSH, T4TOTAL, FREET4, T3FREE, THYROIDAB in the last 72 hours. Anemia Panel: No results for input(s): VITAMINB12, FOLATE, FERRITIN, TIBC, IRON , RETICCTPCT in the last 72 hours. Sepsis Labs: No results for input(s): PROCALCITON, LATICACIDVEN in the last 168 hours.  No results found for this or any previous visit (from the past 240 hours).       Radiology Studies: CT ABDOMEN PELVIS W CONTRAST Result Date: 01/09/2024 CLINICAL DATA:  Acute generalized abdominal pain. EXAM: CT ABDOMEN AND PELVIS WITH CONTRAST TECHNIQUE: Multidetector CT imaging of the abdomen and pelvis was performed using the standard protocol following bolus administration of intravenous contrast. RADIATION DOSE REDUCTION: This exam was performed according to the departmental dose-optimization program which includes automated exposure control, adjustment of the mA and/or kV according to patient size and/or use of iterative reconstruction technique. CONTRAST:  100mL OMNIPAQUE  IOHEXOL  300 MG/ML  SOLN COMPARISON:  None Available. FINDINGS: Lower chest: No acute abnormality. Hepatobiliary: Hepatic steatosis. No cholelithiasis or biliary dilatation. Pancreas: Unremarkable. No pancreatic ductal  dilatation or surrounding inflammatory changes. Spleen: Normal in size without focal abnormality. Adrenals/Urinary Tract: Adrenal glands appear normal. Small nonobstructive calculus seen in lower pole collecting system of right kidney. No hydronephrosis or renal obstruction is noted. Urinary bladder is decompressed. Stomach/Bowel: Stomach is within normal limits. Appendix appears normal. No evidence of bowel wall thickening, distention, or inflammatory changes. Vascular/Lymphatic: No significant vascular findings are present. No enlarged abdominal or pelvic lymph nodes. Reproductive: Uterus and bilateral adnexa are unremarkable. Other: No abdominal wall hernia or abnormality. No abdominopelvic ascites. Musculoskeletal: No acute or significant osseous findings. IMPRESSION: 1. Hepatic steatosis. 2. Small nonobstructive right renal calculus. 3. No acute abnormality seen in the abdomen or pelvis. Electronically Signed   By: Lynwood Landy Raddle M.D.   On: 01/09/2024 13:24        Scheduled Meds:  dextrose   25 g Intravenous Once   dolutegravir   50 mg Oral QPM   And   lamiVUDine   300 mg Oral QPM   fluconazole   100 mg Oral Daily   FLUoxetine   20 mg Oral QHS   fluticasone  furoate-vilanterol  1 puff Inhalation Daily   folic acid   1 mg Oral Daily   gabapentin   600 mg Oral TID   lithium  carbonate  150 mg Oral TID WC  nystatin    Topical TID   pantoprazole   40 mg Oral Daily   Continuous Infusions:  magnesium  sulfate bolus IVPB 1 g (01/10/24 1331)     LOS: 0 days    Time spent: 35 minutes    Jordane Hisle A Derrek Puff, MD [facility]   If 7PM-7AM, please contact night-coverage [email or web address]  01/10/2024, 1:48 PM

## 2024-01-11 DIAGNOSIS — E876 Hypokalemia: Secondary | ICD-10-CM | POA: Diagnosis not present

## 2024-01-11 DIAGNOSIS — F32A Depression, unspecified: Secondary | ICD-10-CM | POA: Diagnosis not present

## 2024-01-11 DIAGNOSIS — K219 Gastro-esophageal reflux disease without esophagitis: Secondary | ICD-10-CM | POA: Diagnosis not present

## 2024-01-11 LAB — BASIC METABOLIC PANEL WITH GFR
Anion gap: 11 (ref 5–15)
BUN: <5 mg/dL — ABNORMAL LOW (ref 6–20)
CO2: 25 mmol/L (ref 22–32)
Calcium: 8 mg/dL — ABNORMAL LOW (ref 8.9–10.3)
Chloride: 102 mmol/L (ref 98–111)
Creatinine, Ser: 0.67 mg/dL (ref 0.44–1.00)
GFR, Estimated: >60 mL/min (ref >60)
Glucose, Bld: 125 mg/dL — ABNORMAL HIGH (ref 70–99)
Potassium: 3.5 mmol/L (ref 3.5–5.1)
Sodium: 138 mmol/L (ref 135–145)

## 2024-01-11 LAB — MAGNESIUM: Magnesium: 2 mg/dL (ref 1.7–2.4)

## 2024-01-11 MED ORDER — MAGNESIUM 200 MG PO TABS: 400.0000 mg | ORAL_TABLET | Freq: Every day | ORAL | 0 refills | Status: DC

## 2024-01-11 MED ORDER — POTASSIUM CHLORIDE CRYS ER 20 MEQ PO TBCR
40.0000 meq | EXTENDED_RELEASE_TABLET | Freq: Once | ORAL | Status: AC
  Administered 2024-01-11: 40 meq via ORAL
  Filled 2024-01-11: qty 2

## 2024-01-11 MED ORDER — POTASSIUM CHLORIDE CRYS ER 20 MEQ PO TBCR: 40.0000 meq | EXTENDED_RELEASE_TABLET | Freq: Every day | ORAL | 0 refills | Status: DC

## 2024-01-11 NOTE — Progress Notes (Signed)
 SATURATION QUALIFICATIONS: (This note is used to comply with regulatory documentation for home oxygen)  Patient Saturations on Room Air at Rest = 96  %  Patient Saturations on Room Air while Ambulating = 96%  Patient Saturations on 0 Liters of oxygen while Ambulating = 96%  Please briefly explain why patient needs home oxygen: At night, patient desats to 80% on RA and needs 2L [place] home O2 to maintain sats above 92%.

## 2024-01-11 NOTE — Plan of Care (Signed)
  Problem: Education: Goal: Knowledge of General Education information will improve Description: Including pain rating scale, medication(s)/side effects and non-pharmacologic comfort measures Outcome: Progressing   Problem: Health Behavior/Discharge Planning: Goal: Ability to manage health-related needs will improve Outcome: Progressing   Problem: Clinical Measurements: Goal: Ability to maintain clinical measurements within normal limits will improve Outcome: Progressing Goal: Diagnostic test results will improve Outcome: Progressing Goal: Respiratory complications will improve Outcome: Progressing   Problem: Activity: Goal: Risk for activity intolerance will decrease Outcome: Progressing   Problem: Coping: Goal: Level of anxiety will decrease Outcome: Progressing   Problem: Pain Managment: Goal: General experience of comfort will improve and/or be controlled Outcome: Progressing

## 2024-01-11 NOTE — TOC Progression Note (Addendum)
 Transition of Care [facility]) - Progression Note    Patient Details  Name: Lindsay French MRN: [id number] Date of Birth: April 22, 1981  [facility] CM/SW Contact  Lorraine LILLETTE Fenton, LCSW Phone Number: 01/11/2024, 12:07 PM  Clinical Narrative:     Pt clear for DC has need for nocturnal 02. CSW spoke with pt regarding choice- pt stated whatever medicaid pays for- mentioned to her [facility] and [facility] options.   [facility] accepted as 02 provider. Referral made. Will add to AVS.  Addendum: [facility] will deliver 02 at home, MD cosigned test- secure email also provided to [facility] with LPM as requested. Now asking for ONO results.   Expected Discharge Plan: Home/Self Care Barriers to Discharge: No Barriers Identified               Expected Discharge Plan and Services In-house Referral: NA Discharge Planning Services: CM Consult Post Acute Care Choice: NA Living arrangements for the past 2 months: Single Family Home Expected Discharge Date: 01/11/24               DME Arranged: N/A DME Agency: NA       HH Arranged: NA HH Agency: NA         Social Drivers of Health (SDOH) Interventions SDOH Screenings   Food Insecurity: No Food Insecurity (01/10/2024)  Housing: Low Risk  (01/10/2024)  Transportation Needs: No Transportation Needs (01/10/2024)  Recent Concern: Transportation Needs - Unmet Transportation Needs (11/16/2023)   Received from [facility]  Utilities: Not At Risk (01/10/2024)  Alcohol Screen: Low Risk  (07/31/2022)  Depression (PHQ2-9): High Risk (12/03/2023)  Financial Resource Strain: Low Risk  (07/31/2022)  Physical Activity: Sufficiently Active (07/31/2022)  Social Connections: Moderately Integrated (07/31/2022)  Recent Concern: Social Connections - Moderately Isolated (06/12/2022)  Stress: No Stress Concern Present (07/31/2022)  Tobacco Use: Medium Risk (01/09/2024)    Readmission Risk Interventions     No data to display

## 2024-01-11 NOTE — Discharge Summary (Addendum)
 Physician Discharge Summary   Patient: Lindsay French MRN: [id number] DOB: 01/07/1981  Admit date:     01/09/2024  Discharge date: 01/11/24  Discharge Physician: Owen DELENA Lore   PCP: Eben Reyes BROCKS, MD   Recommendations at discharge:   Needs Sleep study.  Needs Bmet and Mg level.  Monitor EKG, patient on lithium  and fluconazole .    Discharge Diagnoses: Principal Problem:   Hypomagnesemia Active Problems:   Idiopathic thrombocytopenic purpura (ITP) (HCC)   Class 3 obesity   Hypertension   Hyperlipidemia   HIV disease (HCC)   GERD (gastroesophageal reflux disease)   Hepatic steatosis   Intractable nausea and vomiting   Depression with anxiety   Prolonged QT interval   Abdominal pain   Hypokalemia  Resolved Problems:   * No resolved hospital problems. *  Hospital Course: 43 year old with past medical history significant for ADHD, anxiety, depression, asthma, fatty liver, elevated liver function test, GERD, history of heatstroke, syncopal episode, thrombocytopenia, HIV hyperlipidemia, hypertension, hypothyroidism, class III obesity, polycystic ovarian syndrome, pneumonia who presents with nausea vomiting after using Ozempic .   She Was found to have multiple electrolyte abnormalities, hypomagnesemia hypophosphatemia hypokalemia.  Admitted for further management    Assessment and Plan: 1-Hypomagnesemia replace IV.  Presented with a magnesium  of 0.8.  Magnesium  has increased to 2.0. discharge on magnesium  supplement.  In setting vomiting.   Hypokalemia : replaced. Discharge on 40 meq for 5 days. Needs repeat labs,    Pro long QT in the setting of hypomagnesemia and hypokalemia.  It has improved.  Will resume lithium  today.  Resolved. Monitor out patient. Patient on lithium  and fluconazole .     Abdominal pain, nausea vomiting: In the setting of Ozempic .  Continue Protonix .  As needed antiemetics. She tolerated full liquid diet.  Plan to advance diet to carb modified  today.  Home tomorrow if tolerating diet   Idiopathic thrombocytopenia purpura Hypertension Hyperlipidemia need to follow-up with PCP   HIV: Continue with antiretroviral medication   Steatosis: Liver function test trending down   Depression with anxiety continue Atarax  and Vilazodone    Likely has Sleep Apnea.  Oxygen drop to 80 while sleeping. Nocturnal oxymetry perform/ will try to arrange home oxygen until she is able to get sleep study for CPAP         Consultants: None Procedures performed: none Disposition: Home Diet recommendation:  Discharge Diet Orders (From admission, onward)     Start     Ordered   01/11/24 0000  Diet - low sodium heart healthy        01/11/24 1045           Carb modified diet DISCHARGE MEDICATION: Allergies as of 01/11/2024       Reactions   Cephalexin  Other (See Comments)   Burning face   Citalopram Hydrobromide Other (See Comments)   Felt weird   Hydrocodone -acetaminophen  Hives, Itching   Latex Other (See Comments)   As a teenager, when using latex condoms, she would get a yeast infection.   Lisinopril Cough   Mucinex  Fast-max Chest Cong Ms Cailin.Cabot ] Nausea And Vomiting   Ancef  [cefazolin ] Rash        Medication List     STOP taking these medications    hydrOXYzine  25 MG tablet Commonly known as: ATARAX    oxyCODONE  5 MG immediate release tablet Commonly known as: Roxicodone    Semaglutide  (1 MG/DOSE) 4 MG/3ML Sopn   Vilazodone  HCl 40 MG Tabs Commonly known as: VIIBRYD   TAKE these medications    albuterol  108 (90 Base) MCG/ACT inhaler Commonly known as: VENTOLIN  HFA INHALE 1 PUFF INTO THE LUNGS EVERY 6 HOURS AS NEEDED FOR WHEEZING OR SHORTNESS OF BREATH What changed:  how much to take how to take this when to take this reasons to take this additional instructions   ALPRAZolam  1 MG tablet Commonly known as: XANAX  Take 1 tablet (1 mg total) by mouth 3 (three) times daily as needed for  anxiety.   budesonide -formoterol  80-4.5 MCG/ACT inhaler Commonly known as: Symbicort  Inhale 2 puffs into the lungs daily. What changed:  when to take this reasons to take this   [name]  50-300 MG tablet Generic drug: dolutegravir -lamiVUDine  Take 1 tablet by mouth every evening.   fluconazole  100 MG tablet Commonly known as: Diflucan  Take 1 tablet (100 mg total) by mouth daily.   FLUoxetine  20 MG capsule Commonly known as: PROZAC  Take 20 mg by mouth at bedtime.   folic acid  1 MG tablet Commonly known as: FOLVITE  Take 1 tablet (1 mg total) by mouth daily.   gabapentin  300 MG capsule Commonly known as: Neurontin  Take 2 capsules (600 mg total) by mouth 3 (three) times daily.   Imodium A-D 2 MG capsule Generic drug: loperamide Take 2 mg by mouth as needed for diarrhea or loose stools.   Iron  (Ferrous Sulfate ) 325 (65 Fe) MG Tabs Take 1 tablet by mouth daily. What changed:  how much to take when to take this   lithium  carbonate 150 MG capsule Take 150 mg by mouth 3 (three) times daily with meals.   Magnesium  200 MG Tabs Take 2 tablets (400 mg total) by mouth daily.   naloxone 4 MG/0.1ML Liqd nasal spray kit Commonly known as: NARCAN Place 1 spray into the nose once as needed (AS DIRECTED FOR A CRISIS).   pantoprazole  40 MG tablet Commonly known as: PROTONIX  Take 1 tablet (40 mg total) by mouth daily. What changed: when to take this   polyethylene glycol 17 g packet Commonly known as: MIRALAX  / GLYCOLAX  Take 17 g by mouth daily as needed for moderate constipation.   potassium chloride  SA 20 MEQ tablet Commonly known as: KLOR-CON  M Take 2 tablets (40 mEq total) by mouth daily for 5 doses.   tiZANidine  4 MG tablet Commonly known as: Zanaflex  Take 1 tablet (4 mg total) by mouth daily. What changed:  when to take this reasons to take this               Durable Medical Equipment  (From admission, onward)           Start     Ordered   01/11/24  1042  For home use only DME oxygen  Once       Question Answer Comment  Length of Need 6 Months   Mode or (Route) Nasal cannula   Liters per Minute 3   Frequency Only at night (stationary unit needed)   Oxygen delivery system Gas      01/11/24 1041            Discharge Exam: Filed Weights   01/10/24 1900  Weight: 117.8 kg   General; NAD  Condition at discharge: stable  The results of significant diagnostics from this hospitalization (including imaging, microbiology, ancillary and laboratory) are listed below for reference.   Imaging Studies: CT ABDOMEN PELVIS W CONTRAST Result Date: 01/09/2024 CLINICAL DATA:  Acute generalized abdominal pain. EXAM: CT ABDOMEN AND PELVIS WITH CONTRAST TECHNIQUE: Multidetector CT imaging  of the abdomen and pelvis was performed using the standard protocol following bolus administration of intravenous contrast. RADIATION DOSE REDUCTION: This exam was performed according to the departmental dose-optimization program which includes automated exposure control, adjustment of the mA and/or kV according to patient size and/or use of iterative reconstruction technique. CONTRAST:  OMNIPAQUE  IOHEXOL  300 MG/ML  SOLN COMPARISON:  None Available. FINDINGS: Lower chest: No acute abnormality. Hepatobiliary: Hepatic steatosis. No cholelithiasis or biliary dilatation. Pancreas: Unremarkable. No pancreatic ductal dilatation or surrounding inflammatory changes. Spleen: Normal in size without focal abnormality. Adrenals/Urinary Tract: Adrenal glands appear normal. Small nonobstructive calculus seen in lower pole collecting system of right kidney. No hydronephrosis or renal obstruction is noted. Urinary bladder is decompressed. Stomach/Bowel: Stomach is within normal limits. Appendix appears normal. No evidence of bowel wall thickening, distention, or inflammatory changes. Vascular/Lymphatic: No significant vascular findings are present. No enlarged abdominal or pelvic  lymph nodes. Reproductive: Uterus and bilateral adnexa are unremarkable. Other: No abdominal wall hernia or abnormality. No abdominopelvic ascites. Musculoskeletal: No acute or significant osseous findings. IMPRESSION: 1. Hepatic steatosis. 2. Small nonobstructive right renal calculus. 3. No acute abnormality seen in the abdomen or pelvis. Electronically Signed   By: Lynwood Landy Raddle M.D.   On: 01/09/2024 13:24    Microbiology: Results for orders placed or performed in visit on 06/12/22  COVID-19, Flu A+B and RSV     Status: None   Collection Time: 06/12/22  4:30 PM   Specimen: Nasal Swab   Nasal Swab  Previously  Result Value Ref Range Status   SARS-CoV-2, NAA Not Detected Not Detected Final   Influenza A, NAA Not Detected Not Detected Final   Influenza B, NAA Not Detected Not Detected Final   RSV, NAA Not Detected Not Detected Final   Test Information: Comment  Final    Comment: This nucleic acid amplification test was developed and its performance characteristics determined by [employer]. Nucleic acid amplification tests include RT-PCR and TMA. This test has not been FDA cleared or approved. This test has been authorized by FDA under an Emergency Use Authorization (EUA). This test is only authorized for the duration of time the declaration that circumstances exist justifying the authorization of the emergency use of in vitro diagnostic tests for detection of SARS-CoV-2 virus and/or diagnosis of COVID-19 infection under section 564(b)(1) of the Act, 21 U.S.C. 639aaa-6(a) (1), unless the authorization is terminated or revoked sooner. When diagnostic testing is negative, the possibility of a false negative result should be considered in the context of a patient's recent exposures and the presence of clinical signs and symptoms consistent with COVID-19. An individual without symptoms of COVID-19 and who is not shedding SARS-CoV-2 virus wo uld expect to have a negative (not  detected) result in this assay.     Labs: CBC: Recent Labs  Lab 01/09/24 0933 01/10/24 0519  WBC 9.3 9.0  HGB 14.0 11.8*  HCT 43.3 39.2  MCV 96.2 102.6*  PLT 196 166   Basic Metabolic Panel: Recent Labs  Lab 01/09/24 0933 01/09/24 1814 01/10/24 0519 01/11/24 0725  NA 139  --  140 138  K 3.3*  --  3.2* 3.5  CL 97*  --  104 102  CO2 18*  --  22 25  GLUCOSE 127*  --  96 125*  BUN <5*  --  <5* <5*  CREATININE 0.65  --  0.54 0.67  CALCIUM  9.1  --  7.7* 8.0*  MG 0.8*  --  1.9  2.0  PHOS  --  2.0* 3.5  --    Liver Function Tests: Recent Labs  Lab 01/09/24 0933 01/10/24 0519  AST 195* 98*  ALT 153* 102*  ALKPHOS 114 93  BILITOT 1.4* 1.5*  PROT 7.2 5.8*  ALBUMIN 4.0 3.4*   CBG: No results for input(s): GLUCAP in the last 168 hours.  Discharge time spent: greater than 30 minutes.  Signed: Owen DELENA Lore, MD [facility] 01/11/2024

## 2024-01-22 ENCOUNTER — Ambulatory Visit: Admitting: Infectious Diseases

## 2024-01-22 VITALS — BP 132/87 | HR 88 | Temp 98.3°F | Ht 62.0 in | Wt 257.4 lb

## 2024-01-22 DIAGNOSIS — R112 Nausea with vomiting, unspecified: Secondary | ICD-10-CM | POA: Diagnosis present

## 2024-01-22 DIAGNOSIS — R300 Dysuria: Secondary | ICD-10-CM | POA: Insufficient documentation

## 2024-01-22 DIAGNOSIS — G4700 Insomnia, unspecified: Secondary | ICD-10-CM

## 2024-01-22 DIAGNOSIS — B20 Human immunodeficiency virus [HIV] disease: Secondary | ICD-10-CM

## 2024-01-22 DIAGNOSIS — R351 Nocturia: Secondary | ICD-10-CM | POA: Diagnosis not present

## 2024-01-22 DIAGNOSIS — E66813 Obesity, class 3: Secondary | ICD-10-CM

## 2024-01-22 DIAGNOSIS — B370 Candidal stomatitis: Secondary | ICD-10-CM

## 2024-01-22 DIAGNOSIS — E1165 Type 2 diabetes mellitus with hyperglycemia: Secondary | ICD-10-CM | POA: Diagnosis not present

## 2024-01-22 MED ORDER — FOSFOMYCIN TROMETHAMINE 3 G PO PACK: 3.0000 g | PACK | Freq: Once | ORAL | 1 refills | Status: AC

## 2024-01-22 MED ORDER — MOUNJARO 2.5 MG/0.5ML ~~LOC~~ SOAJ: 2.5000 mg | SUBCUTANEOUS | 3 refills | Status: DC

## 2024-01-22 NOTE — Addendum Note (Signed)
 Addended by: ANTONE DWAYNE SAILOR on: 01/22/2024 03:10 PM   Modules accepted: Orders

## 2024-01-22 NOTE — Assessment & Plan Note (Signed)
 Has recurrence  She is currently taking diflucan , will watch

## 2024-01-22 NOTE — Assessment & Plan Note (Addendum)
 She is off ozempeic Will switch her to [facility] Low dose.  She denies fhx of cancer (FHx lists thyroid issues in mom).

## 2024-01-22 NOTE — Assessment & Plan Note (Signed)
 Will refill her tizanadine prn.  Ask her psychiatrist to weigh in as well.

## 2024-01-22 NOTE — Assessment & Plan Note (Signed)
 Will check her UA and UCx Give her 2 doses of fosfomycin (spaced 3 days apart) She will text me with her sx.

## 2024-01-22 NOTE — Assessment & Plan Note (Signed)
 She is undetectable, CD4 good.  Will defer checking again at this point.  Rtc in 3 months.

## 2024-01-22 NOTE — Assessment & Plan Note (Signed)
 Has prn zofran  Will check her FSG

## 2024-01-22 NOTE — Progress Notes (Signed)
 Subjective:    Patient ID: Lindsay French, female  DOB: 1980-08-28, 43 y.o.        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  Depression/anxiety/ADHD, DM2, obesity.    She  had fall on May 26, 2023 and broke her L foot. She was seen in ED and f/u with ortho. She was taking advil, tylenol  and oxycodone  for pain and developed dark stools. She was advised to stop the advil.  She had surgery 2-18 and pre-op her Hgb was 5.8. She was transfused and underwent repair of the 4 broken bones. She was d/c with asa for DVT prophylaxis, motrin, oxycodone .  We did get her to come in on 2-26 and her repeat hgb was 7.  She was direct admitted and was transfused 1 u prbc.  She underwent upper GI and was found to have several ulcers without bleeding. She deferred colonoscopy.    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 Has worked with attorney to try to correct this.    I last saw her 11-2023, frequently in contact via text.  Her husband is not able to help her (spinal stenosis) and to get her daycare re-opened she needs to clean thoroughly.  She owes back fees for water ($600) and electric ($1000).  Got cut off from food stamps, social security just covers house payment. Has remainder to cover groceries, car payment. Has worked with [employer], out of the garden. Needs to get food stamps restarted.  Has decreased appetite due to anxiety. Her recent psych visit had her xanax  1mg  tid prn. Also on prozac , recently increased to 20mg . ADHD untreated (rx did not seem to be making a difference) and not sleeping (tried all otcs- Mg+, melatonin...). She was in hospital 12-2023 for low potassium, magnesium  which were attributed to n/v due to ozempeic which she has stopped (last Tuesday 9-23).  Has not been checking her glc since. Still having episodes of n/v. After eating. Has been eating less, I can  just survive on water.  Takes 1/2 chocolate nutrition drink for breakfast.     Sleeping poorly, occas relief with tizanidine . Has chronic insomnia from her anxiety.  Husband is working on getting disability.  Her psychiatrist has added lithium  to her prozac . Wants her to have psychological eval. Has weekly tele-conference.  Has been laying in bed with covers pulled up, trying to be a still as possible, pretending she in a coffin. Lithium  added after this. Denies SI.    Wt steady since last visit.    Has been having issues with nocturia, nocturnal incontinence. Is wearing depends, with a pad at night. During the day is having frequency. No cloudiness, has had burning.  No back pain or CVA pain (more than usual).  Having more pain in her broken foot as well as unsteadiness. Has not fallen.   HIV 1 RNA Quant (Copies/mL)  Date Value  06/26/2021 23 (H)  08/30/2020 <20 (H)  03/10/2020 <20   HIV-1 RNA Viral Load (copies/mL)  Date Value  12/03/2023 <20  06/19/2023 <20  02/27/2022 <20   CD4 T Cell Abs (/uL)  Date Value  12/03/2023 1,229  06/19/2023 1,067  02/27/2022 963     Health Maintenance  Topic Date Due  . FOOT EXAM  Never done  . OPHTHALMOLOGY EXAM  Never done  . Diabetic kidney evaluation - Urine ACR  Never done  .  HPV VACCINES (1 - Risk 3-dose SCDM series) Never done  . Cervical Cancer Screening (HPV/Pap Cotest)  12/21/2014  . Mammogram  Never done  . Influenza Vaccine  11/22/2023  . COVID-19 Vaccine (5 - 2025-26 season) 12/23/2023  . HEMOGLOBIN A1C  06/04/2024  . Diabetic kidney evaluation - eGFR measurement  01/10/2025  . DTaP/Tdap/Td (2 - Td or Tdap) 05/25/2033  . Pneumococcal Vaccine  Completed  . Hepatitis B Vaccines 19-59 Average Risk  Completed  . Hepatitis C Screening  Completed  . HIV Screening  Completed  . Meningococcal B Vaccine  Aged Out      Review of Systems  Constitutional:  Positive for malaise/fatigue. Negative for chills, fever and weight  loss.  Respiratory:  Positive for cough.   Gastrointestinal:  Negative for constipation and diarrhea.  Genitourinary:  Positive for dysuria, frequency and urgency.  Psychiatric/Behavioral:  Positive for depression. Negative for suicidal ideas.     Please see HPI. All other systems reviewed and negative.     Objective:  Physical Exam Vitals reviewed.  Constitutional:      General: She is not in acute distress.    Appearance: She is obese.  HENT:     Mouth/Throat:     Mouth: Mucous membranes are moist.     Pharynx: No oropharyngeal exudate.  Eyes:     Extraocular Movements: Extraocular movements intact.     Pupils: Pupils are equal, round, and reactive to light.  Cardiovascular:     Rate and Rhythm: Normal rate and regular rhythm.  Pulmonary:     Effort: Pulmonary effort is normal.     Breath sounds: Normal breath sounds.  Abdominal:     General: Bowel sounds are normal. There is no distension.     Palpations: Abdomen is soft.     Tenderness: There is no abdominal tenderness.  Musculoskeletal:     Cervical back: Normal range of motion and neck supple.     Right lower leg: No edema.     Left lower leg: No edema.  Neurological:     General: No focal deficit present.     Mental Status: She is alert.           Assessment & Plan:

## 2024-01-23 ENCOUNTER — Telehealth: Payer: Self-pay

## 2024-01-23 LAB — URINALYSIS, ROUTINE W REFLEX MICROSCOPIC
Glucose, UA: NEGATIVE
Ketones, UA: NEGATIVE
Nitrite, UA: POSITIVE — AB
Specific Gravity, UA: 1.027 (ref 1.005–1.030)
Urobilinogen, Ur: 1 mg/dL (ref 0.2–1.0)
pH, UA: 5 (ref 5.0–7.5)

## 2024-01-23 LAB — CBC
Hematocrit: 42.1 % (ref 34.0–46.6)
Hemoglobin: 13.5 g/dL (ref 11.1–15.9)
MCH: 32.6 pg (ref 26.6–33.0)
MCHC: 32.1 g/dL (ref 31.5–35.7)
MCV: 102 fL — ABNORMAL HIGH (ref 79–97)
Platelets: 251 x10E3/uL (ref 150–450)
RBC: 4.14 x10E6/uL (ref 3.77–5.28)
RDW: 16.4 % — ABNORMAL HIGH (ref 11.7–15.4)
WBC: 10.3 x10E3/uL (ref 3.4–10.8)

## 2024-01-23 LAB — MICROSCOPIC EXAMINATION
Epithelial Cells (non renal): >10 /HPF — AB (ref 0–10)
RBC, Urine: >30 /HPF — AB (ref 0–2)

## 2024-01-23 LAB — COMPREHENSIVE METABOLIC PANEL WITH GFR
ALT: 83 IU/L — ABNORMAL HIGH (ref 0–32)
AST: 165 IU/L — ABNORMAL HIGH (ref 0–40)
Albumin: 3.9 g/dL (ref 3.9–4.9)
Alkaline Phosphatase: 132 IU/L — ABNORMAL HIGH (ref 41–116)
BUN/Creatinine Ratio: 3 — ABNORMAL LOW (ref 9–23)
BUN: 2 mg/dL — ABNORMAL LOW (ref 6–24)
Bilirubin Total: 1.3 mg/dL — ABNORMAL HIGH (ref 0.0–1.2)
CO2: 25 mmol/L (ref 20–29)
Calcium: 9.3 mg/dL (ref 8.7–10.2)
Chloride: 94 mmol/L — ABNORMAL LOW (ref 96–106)
Creatinine, Ser: 0.65 mg/dL (ref 0.57–1.00)
Globulin, Total: 3.1 g/dL (ref 1.5–4.5)
Glucose: 106 mg/dL — ABNORMAL HIGH (ref 70–99)
Potassium: 4.2 mmol/L (ref 3.5–5.2)
Sodium: 137 mmol/L (ref 134–144)
Total Protein: 7 g/dL (ref 6.0–8.5)
eGFR: 113 mL/min/1.73 (ref >59)

## 2024-01-23 NOTE — Telephone Encounter (Signed)
 Prior Authorization for patient (Mounjaro  2.5MG /0.5ML auto-injectors) came through on cover my meds was submitted with last office notes and labs awaiting approval or denial.  XZB:[id number]

## 2024-01-23 NOTE — Telephone Encounter (Signed)
 Lindsay French (Key: [id number]) Rx #: [id number] Mounjaro  2.5MG /0.5ML auto-injectors Form [employer] [place]  Electronic Prior Authorization Request Form (2017 NCPDP) Created 17 hours ago Sent to Plan 16 hours ago Plan Response 16 hours ago Submit Clinical Questions 16 hours ago Determination Favorable 14 hours ago Message from Plan Approved. This drug has been approved. Approved quantity: 2 ML per 28 day(s). You may fill up to a 34 day supply at a retail pharmacy. You may fill up to a 90 day supply for maintenance drugs, please refer to the formulary for details. Please call the pharmacy to process your prescription claim.. Authorization Expiration Date: January 21, 2025.

## 2024-01-26 LAB — URINE CULTURE

## 2024-02-26 ENCOUNTER — Telehealth: Payer: Self-pay | Admitting: *Deleted

## 2024-02-26 DIAGNOSIS — E1165 Type 2 diabetes mellitus with hyperglycemia: Secondary | ICD-10-CM

## 2024-02-26 MED ORDER — BLOOD GLUCOSE MONITORING SUPPL DEVI: 1.0000 | 0 refills | Status: DC

## 2024-02-26 MED ORDER — TIRZEPATIDE 5 MG/0.5ML ~~LOC~~ SOAJ: 5.0000 mg | Freq: Once | SUBCUTANEOUS | Status: DC

## 2024-02-26 MED ORDER — LANCET DEVICE MISC: 1.0000 | 0 refills | Status: DC

## 2024-02-26 MED ORDER — LANCETS MISC: 1.0000 | 0 refills | Status: DC

## 2024-02-26 MED ORDER — BLOOD GLUCOSE TEST VI STRP: 1.0000 | ORAL_STRIP | 0 refills | Status: DC

## 2024-02-26 NOTE — Telephone Encounter (Signed)
 Pt called and left vm that she needs to increase dose of monjaro Would also like new glucometer.  Will order.

## 2024-02-26 NOTE — Telephone Encounter (Signed)
 Received request from pcp to contact pt  Call to contact pt No answer  CMA left contact info for return call

## 2024-02-28 ENCOUNTER — Emergency Department (HOSPITAL_COMMUNITY)
Admission: EM | Admit: 2024-02-28 | Discharge: 2024-02-28 | Discharge status: Against Medical Advice | Attending: Emergency Medicine | Admitting: Emergency Medicine

## 2024-02-28 ENCOUNTER — Encounter (HOSPITAL_COMMUNITY): Payer: Self-pay

## 2024-02-28 ENCOUNTER — Other Ambulatory Visit: Payer: Self-pay

## 2024-02-28 DIAGNOSIS — Z5321 Procedure and treatment not carried out due to patient leaving prior to being seen by health care provider: Secondary | ICD-10-CM | POA: Diagnosis not present

## 2024-02-28 DIAGNOSIS — F419 Anxiety disorder, unspecified: Secondary | ICD-10-CM | POA: Diagnosis present

## 2024-02-28 LAB — CBC WITH DIFFERENTIAL/PLATELET
Abs Immature Granulocytes: 0.04 K/uL (ref 0.00–0.07)
Basophils Absolute: 0 K/uL (ref 0.0–0.1)
Basophils Relative: 0 %
Eosinophils Absolute: 0 K/uL (ref 0.0–0.5)
Eosinophils Relative: 0 %
HCT: 48.8 % — ABNORMAL HIGH (ref 36.0–46.0)
Hemoglobin: 16 g/dL — ABNORMAL HIGH (ref 12.0–15.0)
Immature Granulocytes: 0 %
Lymphocytes Relative: 20 %
Lymphs Abs: 2.2 K/uL (ref 0.7–4.0)
MCH: 33.2 pg (ref 26.0–34.0)
MCHC: 32.8 g/dL (ref 30.0–36.0)
MCV: 101.2 fL — ABNORMAL HIGH (ref 80.0–100.0)
Monocytes Absolute: 0.9 K/uL (ref 0.1–1.0)
Monocytes Relative: 8 %
Neutro Abs: 7.8 K/uL — ABNORMAL HIGH (ref 1.7–7.7)
Neutrophils Relative %: 72 %
Platelets: 276 K/uL (ref 150–400)
RBC: 4.82 MIL/uL (ref 3.87–5.11)
RDW: 13.2 % (ref 11.5–15.5)
WBC: 11 K/uL — ABNORMAL HIGH (ref 4.0–10.5)
nRBC: 0 % (ref 0.0–0.2)

## 2024-02-28 LAB — COMPREHENSIVE METABOLIC PANEL WITH GFR
ALT: 43 U/L (ref 0–44)
AST: 58 U/L — ABNORMAL HIGH (ref 15–41)
Albumin: 3.7 g/dL (ref 3.5–5.0)
Alkaline Phosphatase: 117 U/L (ref 38–126)
Anion gap: 17 — ABNORMAL HIGH (ref 5–15)
BUN: <5 mg/dL — ABNORMAL LOW (ref 6–20)
CO2: 28 mmol/L (ref 22–32)
Calcium: 8.2 mg/dL — ABNORMAL LOW (ref 8.9–10.3)
Chloride: 94 mmol/L — ABNORMAL LOW (ref 98–111)
Creatinine, Ser: 0.83 mg/dL (ref 0.44–1.00)
GFR, Estimated: >60 mL/min (ref >60)
Glucose, Bld: 122 mg/dL — ABNORMAL HIGH (ref 70–99)
Potassium: 4.2 mmol/L (ref 3.5–5.1)
Sodium: 139 mmol/L (ref 135–145)
Total Bilirubin: 2.2 mg/dL — ABNORMAL HIGH (ref 0.0–1.2)
Total Protein: 7.7 g/dL (ref 6.5–8.1)

## 2024-02-28 LAB — MAGNESIUM: Magnesium: 0.7 mg/dL — CL (ref 1.7–2.4)

## 2024-02-28 NOTE — ED Triage Notes (Signed)
 Pt arrived via EMS from home CC anxiety. Reports feeling like her hands are stiff to move. Pt has tape on her forehead and reports it helps with the anxiety. Took Xnanx X2 PTA

## 2024-02-28 NOTE — ED Notes (Signed)
 Pt left and sign AMA paperwork IV removed

## 2024-02-28 NOTE — ED Notes (Signed)
 Still awaiting provider to bedside for eval

## 2024-02-28 NOTE — ED Notes (Signed)
 MD made aware of critical lab value, pt made awaure. Pt wants to leave AMA due to long wait time.

## 2024-02-28 NOTE — ED Notes (Signed)
 Awaiting provider to bedside for [facility]

## 2024-03-03 ENCOUNTER — Other Ambulatory Visit: Payer: Self-pay | Admitting: Infectious Diseases

## 2024-03-03 DIAGNOSIS — E1165 Type 2 diabetes mellitus with hyperglycemia: Secondary | ICD-10-CM

## 2024-03-03 DIAGNOSIS — E66813 Obesity, class 3: Secondary | ICD-10-CM

## 2024-03-03 MED ORDER — TIRZEPATIDE 7.5 MG/0.5ML ~~LOC~~ SOAJ: 7.5000 mg | Freq: Once | SUBCUTANEOUS | Status: DC

## 2024-03-04 ENCOUNTER — Encounter: Admitting: Infectious Diseases

## 2024-03-17 ENCOUNTER — Encounter: Admitting: Infectious Diseases

## 2024-03-18 ENCOUNTER — Ambulatory Visit: Admitting: Endocrinology

## 2024-03-19 ENCOUNTER — Inpatient Hospital Stay (HOSPITAL_COMMUNITY): Admitting: Certified Registered Nurse Anesthetist

## 2024-03-19 ENCOUNTER — Encounter (HOSPITAL_COMMUNITY): Payer: Self-pay | Admitting: Cardiology

## 2024-03-19 ENCOUNTER — Other Ambulatory Visit: Payer: Self-pay

## 2024-03-19 ENCOUNTER — Encounter (HOSPITAL_COMMUNITY)
Admission: EM | Disposition: A | Discharge status: Home Health | Payer: Self-pay | Source: Home / Self Care | Attending: Internal Medicine

## 2024-03-19 ENCOUNTER — Inpatient Hospital Stay (HOSPITAL_COMMUNITY)
Admission: EM | Admit: 2024-03-19 | Discharge: 2024-03-23 | DRG: 286 | Disposition: A | Attending: Family Medicine | Admitting: Family Medicine

## 2024-03-19 ENCOUNTER — Inpatient Hospital Stay (HOSPITAL_COMMUNITY)

## 2024-03-19 DIAGNOSIS — E785 Hyperlipidemia, unspecified: Secondary | ICD-10-CM | POA: Diagnosis present

## 2024-03-19 DIAGNOSIS — K219 Gastro-esophageal reflux disease without esophagitis: Secondary | ICD-10-CM | POA: Diagnosis present

## 2024-03-19 DIAGNOSIS — H6090 Unspecified otitis externa, unspecified ear: Secondary | ICD-10-CM | POA: Diagnosis present

## 2024-03-19 DIAGNOSIS — Z21 Asymptomatic human immunodeficiency virus [HIV] infection status: Secondary | ICD-10-CM | POA: Diagnosis present

## 2024-03-19 DIAGNOSIS — Z818 Family history of other mental and behavioral disorders: Secondary | ICD-10-CM

## 2024-03-19 DIAGNOSIS — I4721 Torsades de pointes: Principal | ICD-10-CM | POA: Diagnosis present

## 2024-03-19 DIAGNOSIS — F1010 Alcohol abuse, uncomplicated: Secondary | ICD-10-CM | POA: Diagnosis present

## 2024-03-19 DIAGNOSIS — Z8349 Family history of other endocrine, nutritional and metabolic diseases: Secondary | ICD-10-CM

## 2024-03-19 DIAGNOSIS — Z888 Allergy status to other drugs, medicaments and biological substances status: Secondary | ICD-10-CM

## 2024-03-19 DIAGNOSIS — I270 Primary pulmonary hypertension: Secondary | ICD-10-CM

## 2024-03-19 DIAGNOSIS — Z8249 Family history of ischemic heart disease and other diseases of the circulatory system: Secondary | ICD-10-CM

## 2024-03-19 DIAGNOSIS — I10 Essential (primary) hypertension: Secondary | ICD-10-CM | POA: Diagnosis present

## 2024-03-19 DIAGNOSIS — R569 Unspecified convulsions: Secondary | ICD-10-CM | POA: Diagnosis present

## 2024-03-19 DIAGNOSIS — Z885 Allergy status to narcotic agent status: Secondary | ICD-10-CM

## 2024-03-19 DIAGNOSIS — Z794 Long term (current) use of insulin: Secondary | ICD-10-CM

## 2024-03-19 DIAGNOSIS — E039 Hypothyroidism, unspecified: Secondary | ICD-10-CM | POA: Diagnosis present

## 2024-03-19 DIAGNOSIS — I472 Ventricular tachycardia, unspecified: Secondary | ICD-10-CM | POA: Diagnosis not present

## 2024-03-19 DIAGNOSIS — R112 Nausea with vomiting, unspecified: Secondary | ICD-10-CM | POA: Diagnosis present

## 2024-03-19 DIAGNOSIS — Z83438 Family history of other disorder of lipoprotein metabolism and other lipidemia: Secondary | ICD-10-CM

## 2024-03-19 DIAGNOSIS — G4733 Obstructive sleep apnea (adult) (pediatric): Secondary | ICD-10-CM | POA: Diagnosis present

## 2024-03-19 DIAGNOSIS — E441 Mild protein-calorie malnutrition: Secondary | ICD-10-CM | POA: Diagnosis present

## 2024-03-19 DIAGNOSIS — Z6841 Body Mass Index (BMI) 40.0 and over, adult: Secondary | ICD-10-CM

## 2024-03-19 DIAGNOSIS — J9601 Acute respiratory failure with hypoxia: Secondary | ICD-10-CM | POA: Diagnosis present

## 2024-03-19 DIAGNOSIS — B370 Candidal stomatitis: Secondary | ICD-10-CM | POA: Diagnosis not present

## 2024-03-19 DIAGNOSIS — I4581 Long QT syndrome: Secondary | ICD-10-CM

## 2024-03-19 DIAGNOSIS — F17210 Nicotine dependence, cigarettes, uncomplicated: Secondary | ICD-10-CM | POA: Diagnosis present

## 2024-03-19 DIAGNOSIS — I071 Rheumatic tricuspid insufficiency: Secondary | ICD-10-CM | POA: Diagnosis present

## 2024-03-19 DIAGNOSIS — F32A Depression, unspecified: Secondary | ICD-10-CM | POA: Diagnosis present

## 2024-03-19 DIAGNOSIS — D693 Immune thrombocytopenic purpura: Secondary | ICD-10-CM | POA: Diagnosis present

## 2024-03-19 DIAGNOSIS — R197 Diarrhea, unspecified: Secondary | ICD-10-CM | POA: Diagnosis present

## 2024-03-19 DIAGNOSIS — E282 Polycystic ovarian syndrome: Secondary | ICD-10-CM | POA: Diagnosis present

## 2024-03-19 DIAGNOSIS — I4729 Other ventricular tachycardia: Secondary | ICD-10-CM | POA: Diagnosis present

## 2024-03-19 DIAGNOSIS — I272 Pulmonary hypertension, unspecified: Secondary | ICD-10-CM | POA: Diagnosis present

## 2024-03-19 DIAGNOSIS — E66813 Obesity, class 3: Secondary | ICD-10-CM | POA: Diagnosis present

## 2024-03-19 DIAGNOSIS — E876 Hypokalemia: Secondary | ICD-10-CM | POA: Diagnosis present

## 2024-03-19 DIAGNOSIS — I471 Supraventricular tachycardia, unspecified: Secondary | ICD-10-CM | POA: Diagnosis present

## 2024-03-19 DIAGNOSIS — Z79899 Other long term (current) drug therapy: Secondary | ICD-10-CM

## 2024-03-19 DIAGNOSIS — Z7951 Long term (current) use of inhaled steroids: Secondary | ICD-10-CM

## 2024-03-19 DIAGNOSIS — R55 Syncope and collapse: Secondary | ICD-10-CM | POA: Diagnosis present

## 2024-03-19 DIAGNOSIS — E119 Type 2 diabetes mellitus without complications: Secondary | ICD-10-CM | POA: Diagnosis present

## 2024-03-19 DIAGNOSIS — Z91040 Latex allergy status: Secondary | ICD-10-CM

## 2024-03-19 DIAGNOSIS — F419 Anxiety disorder, unspecified: Secondary | ICD-10-CM | POA: Diagnosis present

## 2024-03-19 DIAGNOSIS — Z7982 Long term (current) use of aspirin: Secondary | ICD-10-CM

## 2024-03-19 HISTORY — PX: TEMPORARY PACEMAKER: CATH118268

## 2024-03-19 HISTORY — PX: RIGHT/LEFT HEART CATH AND CORONARY ANGIOGRAPHY: CATH118266

## 2024-03-19 LAB — CBC WITH DIFFERENTIAL/PLATELET
Abs Immature Granulocytes: 0.07 K/uL (ref 0.00–0.07)
Basophils Absolute: 0 K/uL (ref 0.0–0.1)
Basophils Relative: 0 %
Eosinophils Absolute: 0 K/uL (ref 0.0–0.5)
Eosinophils Relative: 0 %
HCT: 45.5 % (ref 36.0–46.0)
Hemoglobin: 15 g/dL (ref 12.0–15.0)
Immature Granulocytes: 1 %
Lymphocytes Relative: 11 %
Lymphs Abs: 1.3 K/uL (ref 0.7–4.0)
MCH: 33.5 pg (ref 26.0–34.0)
MCHC: 33 g/dL (ref 30.0–36.0)
MCV: 101.6 fL — ABNORMAL HIGH (ref 80.0–100.0)
Monocytes Absolute: 0.8 K/uL (ref 0.1–1.0)
Monocytes Relative: 7 %
Neutro Abs: 9.6 K/uL — ABNORMAL HIGH (ref 1.7–7.7)
Neutrophils Relative %: 81 %
Platelets: 312 K/uL (ref 150–400)
RBC: 4.48 MIL/uL (ref 3.87–5.11)
RDW: 13 % (ref 11.5–15.5)
WBC: 11.7 K/uL — ABNORMAL HIGH (ref 4.0–10.5)
nRBC: 0 % (ref 0.0–0.2)

## 2024-03-19 LAB — POCT I-STAT EG7
Acid-base deficit: 1 mmol/L (ref 0.0–2.0)
Acid-base deficit: 1 mmol/L (ref 0.0–2.0)
Bicarbonate: 25.1 mmol/L (ref 20.0–28.0)
Bicarbonate: 25.3 mmol/L (ref 20.0–28.0)
Calcium, Ion: 1.04 mmol/L — ABNORMAL LOW (ref 1.15–1.40)
Calcium, Ion: 1.06 mmol/L — ABNORMAL LOW (ref 1.15–1.40)
HCT: 43 % (ref 36.0–46.0)
HCT: 44 % (ref 36.0–46.0)
Hemoglobin: 14.6 g/dL (ref 12.0–15.0)
Hemoglobin: 15 g/dL (ref 12.0–15.0)
O2 Saturation: 86 %
O2 Saturation: 86 %
Potassium: 3.4 mmol/L — ABNORMAL LOW (ref 3.5–5.1)
Potassium: 3.5 mmol/L (ref 3.5–5.1)
Sodium: 141 mmol/L (ref 135–145)
Sodium: 141 mmol/L (ref 135–145)
TCO2: 26 mmol/L (ref 22–32)
TCO2: 27 mmol/L (ref 22–32)
pCO2, Ven: 46.1 mmHg (ref 44–60)
pCO2, Ven: 47 mmHg (ref 44–60)
pH, Ven: 7.335 (ref 7.25–7.43)
pH, Ven: 7.347 (ref 7.25–7.43)
pO2, Ven: 54 mmHg — ABNORMAL HIGH (ref 32–45)
pO2, Ven: 56 mmHg — ABNORMAL HIGH (ref 32–45)

## 2024-03-19 LAB — POCT I-STAT 7, (LYTES, BLD GAS, ICA,H+H)
Acid-Base Excess: 1 mmol/L (ref 0.0–2.0)
Acid-base deficit: 3 mmol/L — ABNORMAL HIGH (ref 0.0–2.0)
Bicarbonate: 22.5 mmol/L (ref 20.0–28.0)
Bicarbonate: 25.6 mmol/L (ref 20.0–28.0)
Calcium, Ion: 1.01 mmol/L — ABNORMAL LOW (ref 1.15–1.40)
Calcium, Ion: 1.03 mmol/L — ABNORMAL LOW (ref 1.15–1.40)
HCT: 44 % (ref 36.0–46.0)
HCT: 44 % (ref 36.0–46.0)
Hemoglobin: 15 g/dL (ref 12.0–15.0)
Hemoglobin: 15 g/dL (ref 12.0–15.0)
O2 Saturation: 100 %
O2 Saturation: 100 %
Potassium: 3.4 mmol/L — ABNORMAL LOW (ref 3.5–5.1)
Potassium: 3.7 mmol/L (ref 3.5–5.1)
Sodium: 138 mmol/L (ref 135–145)
Sodium: 140 mmol/L (ref 135–145)
TCO2: 24 mmol/L (ref 22–32)
TCO2: 27 mmol/L (ref 22–32)
pCO2 arterial: 41.9 mmHg (ref 32–48)
pCO2 arterial: 39.4 mmHg (ref 32–48)
pH, Arterial: 7.339 — ABNORMAL LOW (ref 7.35–7.45)
pH, Arterial: 7.421 (ref 7.35–7.45)
pO2, Arterial: 498 mmHg — ABNORMAL HIGH (ref 83–108)
pO2, Arterial: 505 mmHg — ABNORMAL HIGH (ref 83–108)

## 2024-03-19 LAB — CBC
HCT: 43.4 % (ref 36.0–46.0)
Hemoglobin: 14.5 g/dL (ref 12.0–15.0)
MCH: 33.1 pg (ref 26.0–34.0)
MCHC: 33.4 g/dL (ref 30.0–36.0)
MCV: 99.1 fL (ref 80.0–100.0)
Platelets: 295 K/uL (ref 150–400)
RBC: 4.38 MIL/uL (ref 3.87–5.11)
RDW: 13 % (ref 11.5–15.5)
WBC: 13 K/uL — ABNORMAL HIGH (ref 4.0–10.5)
nRBC: 0 % (ref 0.0–0.2)

## 2024-03-19 LAB — COMPREHENSIVE METABOLIC PANEL WITH GFR
ALT: 48 U/L — ABNORMAL HIGH (ref 0–44)
AST: 55 U/L — ABNORMAL HIGH (ref 15–41)
Albumin: 3.5 g/dL (ref 3.5–5.0)
Alkaline Phosphatase: 113 U/L (ref 38–126)
Anion gap: 20 — ABNORMAL HIGH (ref 5–15)
BUN: <5 mg/dL — ABNORMAL LOW (ref 6–20)
CO2: 20 mmol/L — ABNORMAL LOW (ref 22–32)
Calcium: 8.2 mg/dL — ABNORMAL LOW (ref 8.9–10.3)
Chloride: 96 mmol/L — ABNORMAL LOW (ref 98–111)
Creatinine, Ser: 0.75 mg/dL (ref 0.44–1.00)
GFR, Estimated: >60 mL/min (ref >60)
Glucose, Bld: 134 mg/dL — ABNORMAL HIGH (ref 70–99)
Potassium: 3.7 mmol/L (ref 3.5–5.1)
Sodium: 136 mmol/L (ref 135–145)
Total Bilirubin: 1.2 mg/dL (ref 0.0–1.2)
Total Protein: 7 g/dL (ref 6.5–8.1)

## 2024-03-19 LAB — CG4 I-STAT (LACTIC ACID): Lactic Acid, Venous: 3.7 mmol/L (ref 0.5–1.9)

## 2024-03-19 LAB — TSH: TSH: 1.641 u[IU]/mL (ref 0.350–4.500)

## 2024-03-19 LAB — MAGNESIUM: Magnesium: 1.7 mg/dL (ref 1.7–2.4)

## 2024-03-19 LAB — PHOSPHORUS: Phosphorus: 1.7 mg/dL — ABNORMAL LOW (ref 2.5–4.6)

## 2024-03-19 LAB — LITHIUM LEVEL: Lithium Lvl: 0.12 mmol/L — ABNORMAL LOW (ref 0.60–1.20)

## 2024-03-19 LAB — GLUCOSE, CAPILLARY: Glucose-Capillary: 129 mg/dL — ABNORMAL HIGH (ref 70–99)

## 2024-03-19 LAB — TROPONIN I (HIGH SENSITIVITY): Troponin I (High Sensitivity): 4 ng/L (ref <18)

## 2024-03-19 LAB — LACTIC ACID, PLASMA: Lactic Acid, Venous: 2.3 mmol/L (ref 0.5–1.9)

## 2024-03-19 SURGERY — RIGHT/LEFT HEART CATH AND CORONARY ANGIOGRAPHY
Anesthesia: LOCAL

## 2024-03-19 MED ORDER — SODIUM CHLORIDE 0.9 % IV SOLN: 250.0000 mL | INTRAVENOUS | Status: AC | PRN

## 2024-03-19 MED ORDER — MIDAZOLAM-SODIUM CHLORIDE 100-0.9 MG/100ML-% IV SOLN
INTRAVENOUS | Status: AC | PRN
  Administered 2024-03-19: 1 mg/h via INTRAVENOUS

## 2024-03-19 MED ORDER — ACETAMINOPHEN 325 MG PO TABS: 650.0000 mg | ORAL_TABLET | ORAL | Status: DC | PRN

## 2024-03-19 MED ORDER — FENTANYL CITRATE (PF) 100 MCG/2ML IJ SOLN
INTRAMUSCULAR | Status: AC
  Filled 2024-03-19: qty 2

## 2024-03-19 MED ORDER — POLYETHYLENE GLYCOL 3350 17 G PO PACK: 17.0000 g | PACK | Freq: Every day | ORAL | Status: DC | PRN

## 2024-03-19 MED ORDER — SODIUM CHLORIDE 0.9% FLUSH
3.0000 mL | Freq: Two times a day (BID) | INTRAVENOUS | Status: DC
  Administered 2024-03-20 – 2024-03-23 (×8): 3 mL via INTRAVENOUS

## 2024-03-19 MED ORDER — SODIUM CHLORIDE 0.9% FLUSH: 3.0000 mL | INTRAVENOUS | Status: DC | PRN

## 2024-03-19 MED ORDER — MIDAZOLAM HCL (PF) 2 MG/2ML IJ SOLN
INTRAMUSCULAR | Status: DC | PRN
  Administered 2024-03-19: 2 mg via INTRAVENOUS
  Administered 2024-03-19: 1 mg via INTRAVENOUS
  Administered 2024-03-19 (×2): 2 mg via INTRAVENOUS

## 2024-03-19 MED ORDER — ETOMIDATE 2 MG/ML IV SOLN
INTRAVENOUS | Status: AC
  Filled 2024-03-19: qty 20

## 2024-03-19 MED ORDER — MIDAZOLAM-SODIUM CHLORIDE 100-0.9 MG/100ML-% IV SOLN
INTRAVENOUS | Status: AC
  Filled 2024-03-19: qty 100

## 2024-03-19 MED ORDER — LIDOCAINE HCL (PF) 1 % IJ SOLN
INTRAMUSCULAR | Status: DC | PRN
  Administered 2024-03-19: 5 mL via INTRADERMAL

## 2024-03-19 MED ORDER — CHLORHEXIDINE GLUCONATE CLOTH 2 % EX PADS
6.0000 | MEDICATED_PAD | Freq: Every day | CUTANEOUS | Status: DC
  Administered 2024-03-19 – 2024-03-21 (×3): 6 via TOPICAL

## 2024-03-19 MED ORDER — LABETALOL HCL 5 MG/ML IV SOLN
10.0000 mg | INTRAVENOUS | Status: AC | PRN
  Administered 2024-03-20 (×2): 10 mg via INTRAVENOUS
  Filled 2024-03-19: qty 4

## 2024-03-19 MED ORDER — INSULIN ASPART 100 UNIT/ML IJ SOLN
0.0000 [IU] | INTRAMUSCULAR | Status: DC
  Administered 2024-03-20 – 2024-03-22 (×5): 1 [IU] via SUBCUTANEOUS
  Filled 2024-03-19 (×5): qty 1

## 2024-03-19 MED ORDER — ROCURONIUM BROMIDE 10 MG/ML (PF) SYRINGE
PREFILLED_SYRINGE | INTRAVENOUS | Status: AC
  Filled 2024-03-19: qty 10

## 2024-03-19 MED ORDER — FAMOTIDINE 20 MG PO TABS: 20.0000 mg | ORAL_TABLET | Freq: Two times a day (BID) | ORAL | Status: DC

## 2024-03-19 MED ORDER — ASPIRIN 81 MG PO CHEW
81.0000 mg | CHEWABLE_TABLET | Freq: Every day | ORAL | Status: DC
  Administered 2024-03-20 – 2024-03-21 (×2): 81 mg
  Filled 2024-03-19 (×2): qty 1

## 2024-03-19 MED ORDER — ASPIRIN 81 MG PO TBEC: 81.0000 mg | DELAYED_RELEASE_TABLET | Freq: Every day | ORAL | Status: DC

## 2024-03-19 MED ORDER — FENTANYL 2500MCG IN NS 250ML (10MCG/ML) PREMIX INFUSION
INTRAVENOUS | Status: AC
  Filled 2024-03-19: qty 250

## 2024-03-19 MED ORDER — MIDAZOLAM BOLUS VIA INFUSION
INTRAVENOUS | Status: DC | PRN
  Administered 2024-03-19: 2 mg via INTRAVENOUS

## 2024-03-19 MED ORDER — PROPOFOL 1000 MG/100ML IV EMUL
0.0000 ug/kg/min | INTRAVENOUS | Status: DC
  Administered 2024-03-19: 20 ug/kg/min via INTRAVENOUS
  Administered 2024-03-20: 25 ug/kg/min via INTRAVENOUS
  Administered 2024-03-20: 60 ug/kg/min via INTRAVENOUS
  Administered 2024-03-20: 45 ug/kg/min via INTRAVENOUS
  Administered 2024-03-20: 35 ug/kg/min via INTRAVENOUS
  Administered 2024-03-20: 45 ug/kg/min via INTRAVENOUS
  Administered 2024-03-20: 60 ug/kg/min via INTRAVENOUS
  Administered 2024-03-20: 45 ug/kg/min via INTRAVENOUS
  Administered 2024-03-21 (×2): 60 ug/kg/min via INTRAVENOUS
  Administered 2024-03-21: 30 ug/kg/min via INTRAVENOUS
  Filled 2024-03-19 (×12): qty 100

## 2024-03-19 MED ORDER — LIDOCAINE HCL (CARDIAC) PF 100 MG/5ML IV SOSY
PREFILLED_SYRINGE | INTRAVENOUS | Status: DC | PRN
  Administered 2024-03-19: 80 mg via INTRAVENOUS

## 2024-03-19 MED ORDER — MIDAZOLAM BOLUS VIA INFUSION
0.0000 mg | INTRAVENOUS | Status: DC | PRN
  Administered 2024-03-19: 5 mg via INTRAVENOUS

## 2024-03-19 MED ORDER — LIDOCAINE BOLUS VIA INFUSION
100.0000 mg | Freq: Once | INTRAVENOUS | Status: DC
  Filled 2024-03-19: qty 100

## 2024-03-19 MED ORDER — HYDRALAZINE HCL 20 MG/ML IJ SOLN: 10.0000 mg | INTRAMUSCULAR | Status: AC | PRN

## 2024-03-19 MED ORDER — MAGNESIUM SULFATE 2 GM/50ML IV SOLN
2.0000 g | Freq: Once | INTRAVENOUS | Status: AC
  Administered 2024-03-19: 2 g via INTRAVENOUS
  Filled 2024-03-19: qty 50

## 2024-03-19 MED ORDER — LIDOCAINE HCL (PF) 1 % IJ SOLN
INTRAMUSCULAR | Status: AC
Start: 2024-03-19 — End: 2024-03-19
  Filled 2024-03-19: qty 30

## 2024-03-19 MED ORDER — HEPARIN (PORCINE) IN NACL 1000-0.9 UT/500ML-% IV SOLN
INTRAVENOUS | Status: DC | PRN
  Administered 2024-03-19: 1500 mL

## 2024-03-19 MED ORDER — POTASSIUM PHOSPHATES 15 MMOLE/5ML IV SOLN
30.0000 mmol | Freq: Once | INTRAVENOUS | Status: AC
  Administered 2024-03-20: 30 mmol via INTRAVENOUS
  Filled 2024-03-19: qty 10

## 2024-03-19 MED ORDER — ATORVASTATIN CALCIUM 80 MG PO TABS
80.0000 mg | ORAL_TABLET | Freq: Every day | ORAL | Status: DC
  Administered 2024-03-20 – 2024-03-21 (×2): 80 mg
  Filled 2024-03-19 (×2): qty 1

## 2024-03-19 MED ORDER — SUCCINYLCHOLINE CHLORIDE 200 MG/10ML IV SOSY
PREFILLED_SYRINGE | INTRAVENOUS | Status: AC
  Filled 2024-03-19: qty 10

## 2024-03-19 MED ORDER — PANTOPRAZOLE SODIUM 40 MG IV SOLR
40.0000 mg | Freq: Every day | INTRAVENOUS | Status: DC
  Filled 2024-03-19: qty 10

## 2024-03-19 MED ORDER — HEPARIN SODIUM (PORCINE) 1000 UNIT/ML IJ SOLN
INTRAMUSCULAR | Status: AC
  Filled 2024-03-19: qty 10

## 2024-03-19 MED ORDER — POTASSIUM CHLORIDE 10 MEQ/100ML IV SOLN
10.0000 meq | INTRAVENOUS | Status: DC
  Administered 2024-03-19 (×2): 10 meq via INTRAVENOUS

## 2024-03-19 MED ORDER — SODIUM CHLORIDE 0.9 % IV SOLN
INTRAVENOUS | Status: AC | PRN
  Administered 2024-03-19: 20 mL/h via INTRAVENOUS

## 2024-03-19 MED ORDER — MIDAZOLAM HCL 2 MG/2ML IJ SOLN
INTRAMUSCULAR | Status: AC
Start: 2024-03-19 — End: 2024-03-19
  Filled 2024-03-19: qty 2

## 2024-03-19 MED ORDER — SUCCINYLCHOLINE CHLORIDE 200 MG/10ML IV SOSY
PREFILLED_SYRINGE | INTRAVENOUS | Status: DC | PRN
Start: 2024-03-19 — End: 2024-03-19
  Administered 2024-03-19: 200 mg via INTRAVENOUS

## 2024-03-19 MED ORDER — FENTANYL 2500MCG IN NS 250ML (10MCG/ML) PREMIX INFUSION
INTRAVENOUS | Status: AC | PRN
  Administered 2024-03-19: 100 ug/h via INTRAVENOUS

## 2024-03-19 MED ORDER — FENTANYL CITRATE (PF) 50 MCG/ML IJ SOSY
PREFILLED_SYRINGE | INTRAMUSCULAR | Status: AC
  Filled 2024-03-19: qty 2

## 2024-03-19 MED ORDER — LACTATED RINGERS IV SOLN: INTRAVENOUS | Status: DC

## 2024-03-19 MED ORDER — MAGNESIUM SULFATE 2 GM/50ML IV SOLN
2.0000 g | Freq: Once | INTRAVENOUS | Status: AC
  Administered 2024-03-19: 2 g via INTRAVENOUS

## 2024-03-19 MED ORDER — IOHEXOL 350 MG/ML SOLN
INTRAVENOUS | Status: DC | PRN
  Administered 2024-03-19: 70 mL

## 2024-03-19 MED ORDER — ENOXAPARIN SODIUM 40 MG/0.4ML IJ SOSY: 40.0000 mg | PREFILLED_SYRINGE | INTRAMUSCULAR | Status: DC

## 2024-03-19 MED ORDER — ETOMIDATE 2 MG/ML IV SOLN
INTRAVENOUS | Status: DC | PRN
  Administered 2024-03-19: 10 mg via INTRAVENOUS

## 2024-03-19 MED ORDER — PROPOFOL 500 MG/50ML IV EMUL
INTRAVENOUS | Status: DC | PRN
  Administered 2024-03-19: 50 ug/kg/min via INTRAVENOUS

## 2024-03-19 MED ORDER — FENTANYL BOLUS VIA INFUSION
INTRAVENOUS | Status: DC | PRN
  Administered 2024-03-19: 50 ug via INTRAVENOUS

## 2024-03-19 MED ORDER — SODIUM CHLORIDE 0.9 % IV SOLN: INTRAVENOUS | Status: AC

## 2024-03-19 MED ORDER — FENTANYL CITRATE (PF) 100 MCG/2ML IJ SOLN
INTRAMUSCULAR | Status: DC | PRN
  Administered 2024-03-19: 25 ug via INTRAVENOUS
  Administered 2024-03-19 (×2): 50 ug via INTRAVENOUS

## 2024-03-19 MED ORDER — FENTANYL BOLUS VIA INFUSION
25.0000 ug | INTRAVENOUS | Status: DC | PRN
  Administered 2024-03-19 – 2024-03-20 (×4): 100 ug via INTRAVENOUS
  Administered 2024-03-20: 50 ug via INTRAVENOUS
  Administered 2024-03-20 (×3): 100 ug via INTRAVENOUS
  Administered 2024-03-20: 50 ug via INTRAVENOUS
  Administered 2024-03-21 (×4): 100 ug via INTRAVENOUS

## 2024-03-19 MED ORDER — SODIUM CHLORIDE 0.9% FLUSH
10.0000 mL | Freq: Two times a day (BID) | INTRAVENOUS | Status: DC
  Administered 2024-03-19 – 2024-03-22 (×6): 10 mL

## 2024-03-19 MED ORDER — MIDAZOLAM HCL 2 MG/2ML IJ SOLN
INTRAMUSCULAR | Status: AC
  Filled 2024-03-19: qty 2

## 2024-03-19 MED ORDER — FENTANYL CITRATE (PF) 100 MCG/2ML IJ SOLN: 25.0000 ug | Freq: Once | INTRAMUSCULAR | Status: DC

## 2024-03-19 MED ORDER — SODIUM CHLORIDE 0.9% FLUSH: 10.0000 mL | INTRAVENOUS | Status: DC | PRN

## 2024-03-19 MED ORDER — FENTANYL 2500MCG IN NS 250ML (10MCG/ML) PREMIX INFUSION
0.0000 ug/h | INTRAVENOUS | Status: DC
  Administered 2024-03-19: 150 ug/h via INTRAVENOUS
  Administered 2024-03-20: 250 ug/h via INTRAVENOUS
  Administered 2024-03-20: 150 ug/h via INTRAVENOUS
  Administered 2024-03-21: 125 ug/h via INTRAVENOUS
  Filled 2024-03-19 (×3): qty 250

## 2024-03-19 MED ORDER — MIDAZOLAM-SODIUM CHLORIDE 100-0.9 MG/100ML-% IV SOLN: 0.0000 mg/h | INTRAVENOUS | Status: DC

## 2024-03-19 MED ORDER — KETAMINE HCL 50 MG/5ML IJ SOSY
PREFILLED_SYRINGE | INTRAMUSCULAR | Status: AC
  Filled 2024-03-19: qty 10

## 2024-03-19 MED ORDER — SODIUM CHLORIDE 0.9 % IV BOLUS
1000.0000 mL | Freq: Once | INTRAVENOUS | Status: AC
  Administered 2024-03-19: 1000 mL via INTRAVENOUS

## 2024-03-19 MED ORDER — DOCUSATE SODIUM 100 MG PO CAPS
100.0000 mg | ORAL_CAPSULE | Freq: Two times a day (BID) | ORAL | Status: DC | PRN
Start: 2024-03-19 — End: 2024-03-19

## 2024-03-19 MED ORDER — VERAPAMIL HCL 2.5 MG/ML IV SOLN
INTRAVENOUS | Status: AC
  Filled 2024-03-19: qty 2

## 2024-03-19 MED ORDER — DOCUSATE SODIUM 50 MG/5ML PO LIQD
100.0000 mg | Freq: Two times a day (BID) | ORAL | Status: DC
  Administered 2024-03-20 – 2024-03-21 (×2): 100 mg
  Filled 2024-03-19 (×2): qty 10

## 2024-03-19 MED ORDER — CALCIUM GLUCONATE-NACL 1-0.675 GM/50ML-% IV SOLN
1.0000 g | Freq: Once | INTRAVENOUS | Status: AC
  Administered 2024-03-19: 1000 mg via INTRAVENOUS
  Filled 2024-03-19: qty 50

## 2024-03-19 MED ORDER — PANTOPRAZOLE SODIUM 40 MG IV SOLR
40.0000 mg | INTRAVENOUS | Status: DC
  Administered 2024-03-20 – 2024-03-22 (×4): 40 mg via INTRAVENOUS
  Filled 2024-03-19 (×3): qty 10

## 2024-03-19 MED ORDER — LIDOCAINE IN D5W 4-5 MG/ML-% IV SOLN
1.0000 mg/min | INTRAVENOUS | Status: DC
  Administered 2024-03-19 – 2024-03-21 (×2): 1 mg/min via INTRAVENOUS
  Filled 2024-03-19: qty 500

## 2024-03-19 MED ORDER — POLYETHYLENE GLYCOL 3350 17 G PO PACK
17.0000 g | PACK | Freq: Every day | ORAL | Status: DC
  Administered 2024-03-21: 17 g
  Filled 2024-03-19: qty 1

## 2024-03-19 MED ORDER — DOLUTEGRAVIR-LAMIVUDINE 50-300 MG PO TABS
1.0000 | ORAL_TABLET | Freq: Every evening | ORAL | Status: DC
  Administered 2024-03-21 – 2024-03-22 (×2): 1 via ORAL
  Filled 2024-03-19 (×3): qty 1

## 2024-03-19 SURGICAL SUPPLY — 16 items
CATH INFINITI 6F ANG MULTIPACK (CATHETERS) IMPLANT
CATH S G BIP PACING (CATHETERS) IMPLANT
CATH SWAN GANZ 7F STRAIGHT (CATHETERS) IMPLANT
GLIDESHEATH SLEND SS 6F .021 (SHEATH) IMPLANT
GUIDEWIRE INQWIRE 1.5J.035X260 (WIRE) IMPLANT
KIT CV MULTILUMEN 7FR 20 (SET/KITS/TRAYS/PACK) IMPLANT
KIT MICROPUNCTURE NIT STIFF (SHEATH) IMPLANT
MAT PREVALON FULL STRYKER (MISCELLANEOUS) IMPLANT
PACK CARDIAC CATHETERIZATION (CUSTOM PROCEDURE TRAY) ×1 IMPLANT
SET ATX-X65L (MISCELLANEOUS) IMPLANT
SHEATH PINNACLE 5F 10CM (SHEATH) IMPLANT
SHEATH PINNACLE 6F 10CM (SHEATH) IMPLANT
SHEATH PINNACLE 7F 10CM (SHEATH) IMPLANT
SHEATH PROBE COVER 6X72 (BAG) IMPLANT
STATION PROTECTION PRESSURIZED (MISCELLANEOUS) IMPLANT
WIRE EMERALD 3MM-J .035X150CM (WIRE) IMPLANT

## 2024-03-19 NOTE — ED Triage Notes (Signed)
 BIB [facility] from home. Bigeminy PVCs  Vtach & Torsades. Taking Monjaro & Lithium . Previous episodes of cardiac arrhythmias  Hx. Seizures. 20g LAC A&O 150/70 BP 134 CBG

## 2024-03-19 NOTE — Progress Notes (Signed)
 Brief Nephrology Note Asked by CCM MD to comment on patient admitted with presentation to ED today with N/V/D and had seizure/syncope with torsades on monitoring.  Normal GFR.  K 3.7, Mg 1.7 at presentation.  EKG with QTc 514.  Seen by cardiology went for [facility] w/o cause.  She has been on fluconazole , companzine, and prozac .  Asked to comment on use of dialysis to remove fluconazole .  GIven that HD itself known to prolong QTc given electrolyte shifts and little evidence base that HD has a clinical role for fluconazole  toxicity, I recommend against use of HD as part of the treatment of her QTC prolongation and torsades.

## 2024-03-19 NOTE — Anesthesia Procedure Notes (Addendum)
 Procedure Name: Intubation Date/Time: 03/19/2024 8:57 PM  Performed by: Roddie Grate, CRNAPre-anesthesia Checklist: Patient identified, Emergency Drugs available, Suction available, Patient being monitored and Timeout performed Patient Re-evaluated:Patient Re-evaluated prior to induction Oxygen Delivery Method: Ambu bag Preoxygenation: Pre-oxygenation with 100% oxygen Induction Type: IV induction, Rapid sequence and Cricoid Pressure applied Laryngoscope Size: Glidescope and 3 Grade View: Grade I Tube type: Subglottic suction tube Tube size: 7.5 mm Number of attempts: 1 Airway Equipment and Method: Stylet and Video-laryngoscopy Placement Confirmation: ETT inserted through vocal cords under direct vision, breath sounds checked- equal and bilateral and CO2 detector Secured at: 22 cm Tube secured with: Tape Dental Injury: Teeth and Oropharynx as per pre-operative assessment

## 2024-03-19 NOTE — Consult Note (Signed)
 Electrophysiology consultation   Patient ID: Lindsay French MRN: [id number]; DOB: 1980-12-16  Admit date: 03/19/2024 Date of Consult: 03/19/2024  PCP:  Lindsay Reyes BROCKS, MD   History of Present Illness:   Lindsay French is a 43 year old woman who I am seeing today for an evaluation of polymorphic ventricular tachycardia at the request of the emergency department physician.  The patient has a history of HIV (reportedly undetectable viral load), torsades, alcohol abuse, anxiety.    She was seen in the minute clinic on March 12, 2024 for otitis media.  She was prescribed fluconazole , Augmentin, Compazine .  Today she tells me that she has had intractable nausea vomiting and diarrhea throughout the day.  She cannot even remember how many times she has had a bowel movement and vomited.  She has taken her prescribed antinausea medication but she is unable to remember which one.  I suspect it was Compazine  as this was recently prescribed at a [facility] on November 20.  She tells me that she has not taken an intentional overdose of any medications.  She tells me that she is taking her medications as prescribed.   Past medical, surgical, social and family history reviewed.  ROS:  Please see the history of present illness.  All other ROS reviewed and negative.     Physical Exam/Data:   Vitals:   03/19/24 1837 03/19/24 1838 03/19/24 1849  BP: (!) 152/132    Pulse:   96  Resp: (!) 21 17 16   Temp:   98.2 F (36.8 C)  TempSrc:   Oral    General: Ill-appearing, obese in moderate distress.  Anxious.  Intermittently losing consciousness. Cardiac: Warm extremities.  In and out of consciousness during ventricular tachycardia episodes. Psych: Anxious  EKG:  The EKG was personally reviewed and demonstrates:   Multiple EKGs reviewed.  Baseline QTc prolonged at 480 to 490 ms.  Presenting EKG shows polymorphic ventricular tachycardia.  Intermittently, the patient will have episodes of sinus rhythm.  The  show QRS alternans with ST elevation in V1 and V2 with ST depressions in the inferior leads. Telemetry:  Telemetry was personally reviewed and demonstrates: Polymorphic ventricular tachycardia    Assessment and Plan:   #Recurrent polymorphic ventricular tachycardia The patient presents with refractory ventricular arrhythmias.  I suspect these are secondary to malignant QT prolongation.  Most likely etiology would be medications given she is on several QT prolonging agents (Compazine , fluconazole ). I am also concerned about the possibility of ischemia contributing.  Her EKG is abnormal and there is ST elevation.  Unclear if these are contributing to the ventricular tachycardia or could be secondary to prolonged periods of ischemia in the setting of sustained polymorphic ventricular tachycardia.  I do think it is prudent to rule out obstructive coronary artery disease with a left heart catheterization.  Recommend right heart cath at the same time.  Recommend leaving a temporary pacemaker in place at the time of heart cath to see if we can overdrive suppress the ventricular arrhythmias.  I have notified our heart failure team given the possibility that she could hemodynamically deteriorate.  If no clear culprit on left and right heart cath, favor intubation and sedation to see if adrenergic suppression could help suppress the ventricular arrhythmias.  I am discussing with pharmacy whether or not there are any reversible agents contributing.  She is critically ill and will need to be admitted to the to her ICU.     CRITICAL CARE Performed by: Lovis More T Adan Beal  Total critical care time: 45 minutes  Critical care time was exclusive of separately billable procedures and treating other patients.  Critical care was necessary to treat or prevent imminent or life-threatening deterioration.  Critical care was time spent personally by me on the following activities: development of treatment plan  with patient and/or surrogate as well as nursing, discussions with consultants, evaluation of patient's response to treatment, examination of patient, obtaining history from patient or surrogate, ordering and performing treatments and interventions, ordering and review of laboratory studies, ordering and review of radiographic studies, pulse oximetry and re-evaluation of patient's condition.   Ole T. Cindie, MD, [facility], [facility] Cardiac Electrophysiology

## 2024-03-19 NOTE — Interval H&P Note (Signed)
 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  The various methods of treatment have been discussed with the patient and family. After consideration of risks, benefits and other options for treatment, the patient has consented to  Procedure(s): Coronary/Graft Acute MI Revascularization (N/A) RIGHT/LEFT HEART CATH AND CORONARY ANGIOGRAPHY (N/A)  TEMPORARY PACEMAKER MECHANICAL CARDIOVASCULAR SUPPORT  as a surgical intervention.  [name] history has been reviewed, patient examined, no change in status, stable for surgery.  I have reviewed the patient's chart and labs.  Questions were answered to the patient's satisfaction.    Cath Lab Visit (complete for each Cath Lab visit)  Clinical Evaluation Leading to the Procedure:   ACS: Unclear  Non-ACS:    Anginal Classification: No true angina  Anti-ischemic medical therapy: Minimal Therapy (1 class of medications)  Non-Invasive Test Results: No non-invasive testing performed  Prior CABG: No previous CABG   Alm Clay

## 2024-03-19 NOTE — H&P (View-Only) (Signed)
 Electrophysiology consultation   Patient ID: Lindsay French MRN: [id number]; DOB: 1980-12-16  Admit date: 03/19/2024 Date of Consult: 03/19/2024  PCP:  Lindsay Reyes BROCKS, MD   History of Present Illness:   [name] is a 43 year old woman who I am seeing today for an evaluation of polymorphic ventricular tachycardia at the request of the emergency department physician.  The patient has a history of HIV (reportedly undetectable viral load), torsades, alcohol abuse, anxiety.    She was seen in the minute clinic on March 12, 2024 for otitis media.  She was prescribed fluconazole , Augmentin, Compazine .  Today she tells me that she has had intractable nausea vomiting and diarrhea throughout the day.  She cannot even remember how many times she has had a bowel movement and vomited.  She has taken her prescribed antinausea medication but she is unable to remember which one.  I suspect it was Compazine  as [facility].  She tells me that she has not taken an intentional overdose of any medications.  She tells me that she is taking her medications as prescribed.   Past medical, surgical, social and family history reviewed.  ROS:  Please see the history of present illness.  All other ROS reviewed and negative.     Physical Exam/Data:   Vitals:   03/19/24 1837 03/19/24 1838 03/19/24 1849  BP: (!) 152/132    Pulse:   96  Resp: (!) 21 17 16   Temp:   98.2 F (36.8 C)  TempSrc:   Oral    General: Ill-appearing, obese in moderate distress.  Anxious.  Intermittently losing consciousness. Cardiac: Warm extremities.  In and out of consciousness during ventricular tachycardia episodes. Psych: Anxious  EKG:  The EKG was personally reviewed and demonstrates:   Multiple EKGs reviewed.  Baseline QTc prolonged at 480 to 490 ms.  Presenting EKG shows polymorphic ventricular tachycardia.  Intermittently, the patient will have episodes of sinus rhythm.  The  show QRS alternans with ST elevation in V1 and V2 with ST depressions in the inferior leads. Telemetry:  Telemetry was personally reviewed and demonstrates: Polymorphic ventricular tachycardia    Assessment and Plan:   #Recurrent polymorphic ventricular tachycardia The patient presents with refractory ventricular arrhythmias.  I suspect these are secondary to malignant QT prolongation.  Most likely etiology would be medications given she is on several QT prolonging agents (Compazine , fluconazole ). I am also concerned about the possibility of ischemia contributing.  Her EKG is abnormal and there is ST elevation.  Unclear if these are contributing to the ventricular tachycardia or could be secondary to prolonged periods of ischemia in the setting of sustained polymorphic ventricular tachycardia.  I do think it is prudent to rule out obstructive coronary artery disease with a left heart catheterization.  Recommend right heart cath at the same time.  Recommend leaving a temporary pacemaker in place at the time of heart cath to see if we can overdrive suppress the ventricular arrhythmias.  I have notified our heart failure team given the possibility that she could hemodynamically deteriorate.  If no clear culprit on left and right heart cath, favor intubation and sedation to see if adrenergic suppression could help suppress the ventricular arrhythmias.  I am discussing with pharmacy whether or not there are any reversible agents contributing.  She is critically ill and will need to be admitted to the to her ICU.     CRITICAL CARE Performed by: Lovis More T Adan Beal  Total critical care time: 45 minutes  Critical care time was exclusive of separately billable procedures and treating other patients.  Critical care was necessary to treat or prevent imminent or life-threatening deterioration.  Critical care was time spent personally by me on the following activities: development of treatment plan  with patient and/or surrogate as well as nursing, discussions with consultants, evaluation of patient's response to treatment, examination of patient, obtaining history from patient or surrogate, ordering and performing treatments and interventions, ordering and review of laboratory studies, ordering and review of radiographic studies, pulse oximetry and re-evaluation of patient's condition.   Ole T. Cindie, MD, [facility], [facility] Cardiac Electrophysiology

## 2024-03-19 NOTE — ED Provider Notes (Signed)
 [place] EMERGENCY DEPARTMENT AT [facility] Provider Note   CSN: [id number] Arrival date & time: 03/19/24  8166     Patient presents with: Irregular Heart Beat   Lindsay French is a 43 y.o. female.   HPI    Patient presents because of near syncope.  Patient started with nausea vomiting diarrhea starting today.  EMS arrived, found patient to have runs of V. tach.  Patient is been admitted in setting of hypomagnesia in the past.  Patient was started on 2 g IV magnesium .  Finishing up upon arrival to the ED.  Patient states that she is been having some nausea vomit diarrhea over the past day or so.  Increasing weakness starting this afternoon.  Endorses some slight shortness of breath.  No current chest pain.  Feeling near syncopal    Previous medical history reviewed : Patient was last admitted and discharged on January 11, 2024.  Hypomagnesia.  ITP.  Initially presented with magnesium  0.8.  In the setting of nausea and vomiting prolonged QT as well in the setting of hypomagnesia and hypokalemia.  To be admitted in the ICU back in July 2025.  Low magnesium .  Runs of SVT as well as short runs of torsades.   Prior to Admission medications   Medication Sig Start Date End Date Taking? Authorizing Provider  albuterol  (VENTOLIN  HFA) 108 (90 Base) MCG/ACT inhaler INHALE 1 PUFF INTO THE LUNGS EVERY 6 HOURS AS NEEDED FOR WHEEZING OR SHORTNESS OF BREATH Patient taking differently: Inhale 1 puff into the lungs every 6 (six) hours as needed for wheezing or shortness of breath. 06/05/22   Eben Reyes BROCKS, MD  ALPRAZolam  (XANAX ) 1 MG tablet Take 1 tablet (1 mg total) by mouth 3 (three) times daily as needed for anxiety. 08/28/23   Eben Reyes BROCKS, MD  Blood Glucose Monitoring Suppl DEVI 1 each by Does not apply route as directed. Dispense based on patient and insurance preference. Use up to four times daily as directed. (FOR ICD-10 E10.9, E11.9). 02/26/24   Eben Reyes BROCKS, MD   budesonide -formoterol  (SYMBICORT ) 80-4.5 MCG/ACT inhaler Inhale 2 puffs into the lungs daily. Patient taking differently: Inhale 2 puffs into the lungs daily as needed (when sick). 06/12/22   Eben Reyes BROCKS, MD  dolutegravir -lamiVUDine  ([name] ) 50-300 MG tablet Take 1 tablet by mouth every evening. 08/20/23   Eben Reyes BROCKS, MD  FLUoxetine  (PROZAC ) 20 MG capsule Take 20 mg by mouth at bedtime.    [provider]  folic acid  (FOLVITE ) 1 MG tablet Take 1 tablet (1 mg total) by mouth daily. 06/23/23   Masters, Katie, DO  gabapentin  (NEURONTIN ) 300 MG capsule Take 2 capsules (600 mg total) by mouth 3 (three) times daily. Patient not taking: Reported on 01/09/2024 06/30/23   Eben Reyes BROCKS, MD  Glucose Blood (BLOOD GLUCOSE TEST STRIPS) STRP 1 each by Does not apply route as directed. Dispense based on patient and insurance preference. Use up to four times daily as directed. (FOR ICD-10 E10.9, E11.9). 02/26/24   Eben Reyes BROCKS, MD  IMODIUM A-D 2 MG capsule Take 2 mg by mouth as needed for diarrhea or loose stools.    [provider]  Iron , Ferrous Sulfate , 325 (65 Fe) MG TABS Take 1 tablet by mouth daily. Patient taking differently: Take 325 mg by mouth daily after supper. 07/30/23 01/09/24  Eben Reyes BROCKS, MD  Lancet Device MISC 1 each by Does not apply route as directed. Dispense based on patient and insurance  preference. Use up to four times daily as directed. (FOR ICD-10 E10.9, E11.9). 02/26/24   Eben Reyes BROCKS, MD  Lancets MISC 1 each by Does not apply route as directed. Dispense based on patient and insurance preference. Use up to four times daily as directed. (FOR ICD-10 E10.9, E11.9). 02/26/24   Eben Reyes BROCKS, MD  lithium  carbonate 150 MG capsule Take 150 mg by mouth 3 (three) times daily with meals.    [provider]  Magnesium  200 MG TABS Take 2 tablets (400 mg total) by mouth daily. 01/11/24   Regalado, Belkys A, MD  naloxone (NARCAN) nasal spray 4  mg/0.1 mL Place 1 spray into the nose once as needed (AS DIRECTED FOR A CRISIS). 09/03/23   [provider]  pantoprazole  (PROTONIX ) 40 MG tablet Take 1 tablet (40 mg total) by mouth daily. Patient taking differently: Take 40 mg by mouth at bedtime. 08/19/23   Eben Reyes BROCKS, MD  polyethylene glycol (MIRALAX  / GLYCOLAX ) 17 g packet Take 17 g by mouth daily as needed for moderate constipation. Patient not taking: Reported on 01/09/2024 06/22/23   Masters, Izetta, DO  potassium chloride  SA (KLOR-CON  M) 20 MEQ tablet Take 2 tablets (40 mEq total) by mouth daily for 5 doses. 01/11/24 01/16/24  Regalado, Owen A, MD  tiZANidine  (ZANAFLEX ) 4 MG tablet Take 1 tablet (4 mg total) by mouth daily. Patient taking differently: Take 4 mg by mouth at bedtime as needed for muscle spasms. 12/03/23 12/02/24  Eben Reyes BROCKS, MD    Allergies: Cephalexin , Citalopram hydrobromide, Hydrocodone -acetaminophen , Latex, Lisinopril, Mucinex  fast-max chest cong ms [guaifenesin ], and Ancef  [cefazolin ]    Review of Systems  Constitutional:  Negative for chills and fever.  HENT:  Negative for ear pain and sore throat.   Eyes:  Negative for pain and visual disturbance.  Respiratory:  Negative for cough and shortness of breath.   Cardiovascular:  Negative for chest pain and palpitations.  Gastrointestinal:  Negative for abdominal pain and vomiting.  Genitourinary:  Negative for dysuria and hematuria.  Musculoskeletal:  Negative for arthralgias and back pain.  Skin:  Negative for color change and rash.  Neurological:  Negative for seizures and syncope.  All other systems reviewed and are negative.   Updated Vital Signs BP (!) 158/104   Pulse 87   Temp 98.2 F (36.8 C) (Oral)   Resp 17   SpO2 98%   Physical Exam Vitals and nursing note reviewed.  Constitutional:      General: She is not in acute distress.    Appearance: She is well-developed.  HENT:     Head: Normocephalic and atraumatic.  Eyes:      Conjunctiva/sclera: Conjunctivae normal.  Cardiovascular:     Rate and Rhythm: Normal rate and regular rhythm.     Heart sounds: No murmur heard. Pulmonary:     Effort: Pulmonary effort is normal. No respiratory distress.     Breath sounds: Normal breath sounds.  Abdominal:     Palpations: Abdomen is soft.     Tenderness: There is no abdominal tenderness.  Musculoskeletal:        General: No swelling.     Cervical back: Neck supple.  Skin:    General: Skin is warm and dry.     Capillary Refill: Capillary refill takes less than 2 seconds.  Neurological:     Mental Status: She is alert.  Psychiatric:        Mood and Affect: Mood normal.  Media Information  Document Information  Photos    03/19/2024 18:41  Attached To:  Hospital Encounter on 03/19/24  Source Information  Simon Lavonia SAILOR, MD  [facility]   (all labs ordered are listed, but only abnormal results are displayed) Labs Reviewed  COMPREHENSIVE METABOLIC PANEL WITH GFR - Abnormal; Notable for the following components:      Result Value   Chloride 96 (*)    CO2 20 (*)    Glucose, Bld 134 (*)    BUN <5 (*)    Calcium  8.2 (*)    AST 55 (*)    ALT 48 (*)    Anion gap 20 (*)    All other components within normal limits  CBC WITH DIFFERENTIAL/PLATELET - Abnormal; Notable for the following components:   WBC 11.7 (*)    MCV 101.6 (*)    Neutro Abs 9.6 (*)    All other components within normal limits  LITHIUM  LEVEL - Abnormal; Notable for the following components:   Lithium  Lvl 0.12 (*)    All other components within normal limits  PHOSPHORUS - Abnormal; Notable for the following components:   Phosphorus 1.7 (*)    All other components within normal limits  POCT I-STAT 7, (LYTES, BLD GAS, ICA,H+H) - Abnormal; Notable for the following components:   pH, Arterial 7.339 (*)    pO2, Arterial 498 (*)    Acid-base deficit 3.0 (*)    Potassium 3.4 (*)    Calcium , Ion 1.01 (*)    All other components  within normal limits  POCT I-STAT EG7 - Abnormal; Notable for the following components:   pO2, Ven 54 (*)    Calcium , Ion 1.04 (*)    All other components within normal limits  POCT I-STAT EG7 - Abnormal; Notable for the following components:   pO2, Ven 56 (*)    Potassium 3.4 (*)    Calcium , Ion 1.06 (*)    All other components within normal limits  CG4 I-STAT (LACTIC ACID) - Abnormal; Notable for the following components:   Lactic Acid, Venous 3.7 (*)    All other components within normal limits  POCT I-STAT 7, (LYTES, BLD GAS, ICA,H+H) - Abnormal; Notable for the following components:   pO2, Arterial 505 (*)    Calcium , Ion 1.03 (*)    All other components within normal limits  MRSA NEXT GEN BY PCR, NASAL  MAGNESIUM   TSH  MAGNESIUM   PHOSPHORUS  LIPASE, BLOOD  LACTIC ACID, PLASMA  LACTIC ACID, PLASMA  RAPID URINE DRUG SCREEN, HOSP PERFORMED  URINALYSIS, ROUTINE W REFLEX MICROSCOPIC  LIPID PANEL  HEMOGLOBIN A1C  BLOOD GAS, ARTERIAL  BLOOD GAS, ARTERIAL  LIPOPROTEIN A (LPA)  CK  CBC  COMPREHENSIVE METABOLIC PANEL WITH GFR  LIDOCAINE  LEVEL  I-STAT CHEM 8, ED  TROPONIN I (HIGH SENSITIVITY)  TROPONIN I (HIGH SENSITIVITY)    EKG: EKG Interpretation Date/Time:  Thursday March 19 2024 19:05:59 EST Ventricular Rate:  98 PR Interval:  152 QRS Duration:  97 QT Interval:  420 QTC Calculation: 514 R Axis:   65  Text Interpretation: Sinus rhythm Ventricular bigeminy Anteroseptal infarct, age indeterminate Prolonged QT interval Confirmed by Simon Lavonia [phone]) on 03/19/2024 7:12:04 PM  Radiology: [facility] Chest Port 1 View Result Date: 03/19/2024 EXAM: 1 VIEW(S) XRAY OF THE CHEST 03/19/2024 11:13:00 PM COMPARISON: 11/09/2020 CLINICAL HISTORY: Check endotracheal tube placement. FINDINGS: LINES, TUBES AND DEVICES: The endotracheal tube is seen 3.5 cm above the carina. LUNGS AND PLEURA: Lungs are hypoinflated  but clear. No pleural effusion. No pneumothorax. HEART AND  MEDIASTINUM: Cardiac shadow is accentuated by the portable technique. BONES AND SOFT TISSUES: No bony abnormality is noted. IMPRESSION: 1. Endotracheal tube appropriately positioned, approximately 3.5 cm above the carina. 2. Low lung volumes without focal consolidation or edema. Electronically signed by: Oneil Devonshire MD 03/19/2024 11:19 PM EST RP Workstation: [id number]   CARDIAC CATHETERIZATION Result Date: 03/19/2024 Table formatting from the original result was not included. Images from the original result were not included.   There is hyperdynamic left ventricular systolic function.  The left ventricular ejection fraction is greater than 65% by visual estimate.  LV end diastolic pressure is mildly elevated.   Hemodynamic findings consistent with mild pulmonary hypertension.   There is no aortic valve stenosis. Dominance: Right     Left Ventriculography:EF > 65%, hyperdynamic FINDINGS Incessant Recurrent Polymorphic Ventricular Tachycardia/Torsades upon required defibrillation x 1 otherwise with self terminating Angiographically Normal Coronary Arteries Mild Pulmonary Hypertension with mean PAP 29 mmHg and a PCWP of 19 mm/LVEDP of 16 mmHg. Hyperdynamic left ventricular function EF roughly 65 to 70%, Cardiac Output and Index also elevated. Fick 9.9-4.66; thermodilution 8.01-3.77 with AF sat 100% PA sat of 86% on ventilator   RECOMMENDATIONS Admit to CVICU on Versed  and fentanyl  drip. Arterial oxygen in place, can continue to use for monitoring pressures and oxygen saturations until adequate alternative access of obtained. Temporary wire is in place localized in the RV set up to a backup pace at 100 bpm at 5 MA if turned on.  Currently off. Continue to replete electrolytes Alm Clay, MD     Procedures   Medications Ordered in the ED  calcium  gluconate 1 g/ 50 mL sodium chloride  IVPB (1,000 mg Intravenous New Bag/Given 03/19/24 2307)  lidocaine  (XYLOCAINE ) 4 mg/mL bolus via infusion 100 mg (  Intravenous MAR Unhold 03/19/24 2251)    And  lidocaine  (cardiac) 2000 mg in dextrose  5% 500 mL (4mg /mL) IV infusion ( Intravenous MAR Unhold 03/19/24 2251)  Chlorhexidine  Gluconate Cloth 2 % PADS 6 each ( Topical MAR Unhold 03/19/24 2251)  potassium PHOSPHATE  30 mmol in dextrose  5 % 500 mL infusion ( Intravenous MAR Unhold 03/19/24 2251)  insulin  aspart (novoLOG ) injection 0-9 Units ( Subcutaneous MAR Unhold 03/19/24 2251)  dolutegravir -lamiVUDine  ([name] ) 50-300 MG per tablet 1 tablet (has no administration in time range)  atorvastatin  (LIPITOR) tablet 80 mg (has no administration in time range)  pantoprazole  (PROTONIX ) injection 40 mg (has no administration in time range)  lactated ringers  infusion (has no administration in time range)  rocuronium  (ZEMURON ) 100 MG/10ML injection (has no administration in time range)  fentaNYL  10 mcg/ml infusion (has no administration in time range)  midazolam -sodium chloride  100-0.9 MG/100ML-% infusion (has no administration in time range)  docusate (COLACE) 50 MG/5ML liquid 100 mg (has no administration in time range)  polyethylene glycol (MIRALAX  / GLYCOLAX ) packet 17 g (has no administration in time range)  fentaNYL  in NS (9mcg/ml) infusion-PREMIX (150 mcg/hr Intravenous New Bag/Given 03/19/24 2317)  fentaNYL  (SUBLIMAZE ) bolus via infusion 25-100 mcg (100 mcg Intravenous Bolus from Bag 03/19/24 2258)  sodium chloride  flush (NS) 0.9 % injection 10-40 mL (has no administration in time range)  sodium chloride  flush (NS) 0.9 % injection 10-40 mL (has no administration in time range)  labetalol  (NORMODYNE ) injection 10 mg (has no administration in time range)  hydrALAZINE  (APRESOLINE ) injection 10 mg (has no administration in time range)  0.9 %  sodium chloride  infusion (has no administration in  time range)  sodium chloride  flush (NS) 0.9 % injection 3 mL (has no administration in time range)  sodium chloride  flush (NS) 0.9 % injection 3 mL  (has no administration in time range)  0.9 %  sodium chloride  infusion (has no administration in time range)  magnesium  sulfate IVPB 2 g 50 mL (2 g Intravenous New Bag/Given 03/19/24 2315)  midazolam  (VERSED ) 100 mg/100 mL (1 mg/mL) premix infusion (3 mg/hr Intravenous Transfusing/Transfer 03/19/24 2318)  midazolam  (VERSED ) bolus via infusion 0-5 mg (5 mg Intravenous Bolus from Bag 03/19/24 2319)  aspirin  chewable tablet 81 mg (has no administration in time range)  sodium chloride  0.9 % bolus 1,000 mL (1,000 mLs Intravenous New Bag/Given 03/19/24 1857)  magnesium  sulfate IVPB 2 g 50 mL (0 g Intravenous Stopped 03/19/24 1945)  succinylcholine  (ANECTINE ) 200 MG/10ML syringe (  Override pull for Anesthesia 03/19/24 2105)  etomidate  (AMIDATE ) 2 MG/ML injection (  Override pull for Anesthesia 03/19/24 2104)  midazolam  (VERSED ) 100 mg/100 mL (1 mg/mL) premix infusion (3 mg/hr Intravenous Rate/Dose Change 03/19/24 2214)  fentaNYL  in NS (5mcg/ml) infusion-PREMIX (150 mcg/hr Intravenous Rate/Dose Change 03/19/24 2214)  0.9 %  sodium chloride  infusion (20 mL/hr Intravenous New Bag/Given 03/19/24 2124)                                    Medical Decision Making Amount and/or Complexity of Data Reviewed Labs: ordered.  Risk Prescription drug management. Decision regarding hospitalization.     HPI:      Patient presents because of near syncope.  Patient started with nausea vomiting diarrhea starting today.  EMS arrived, found patient to have runs of V. tach.  Patient is been admitted in setting of hypomagnesia in the past.  Patient was started on 2 g IV magnesium .  Finishing up upon arrival to the ED.  Patient states that she is been having some nausea vomit diarrhea over the past day or so.  Increasing weakness starting this afternoon.  Endorses some slight shortness of breath.  No current chest pain.  Feeling near syncopal    Previous medical history reviewed : Patient was  last admitted and discharged on January 11, 2024.  Hypomagnesia.  ITP.  Initially presented with magnesium  0.8.  In the setting of nausea and vomiting prolonged QT as well in the setting of hypomagnesia and hypokalemia.  To be admitted in the ICU back in July 2025.  Low magnesium .  Runs of SVT as well as short runs of torsades.   MDM:   Patient presents because of torsades.  Upon exam, patient ANO x 3 GCS 15.  Slight distress and anxious appearing.  Patient had already been started on 2 g of magnesium .  I presumed that she was going to torsades with prolonged QTc because of electrolyte derangements.  She is also on QTc prolonging agents such as lithium .  Will add on lithium  level.  Added on potassium as well as magnesium  TSH.  Start patient on a liter of fluid.  Start patient on another 2 g of IV magnesium , 1 g of calcium  gluconate liter fluid as well as IV potassium.  Patient continued to go in and out of torsades.  Had 1 episode where she lost consciousness but subsequently regained consciousness quickly thereafter without any kind of acute intervention such as cardioversion.   Consulted cardiology.  They evaluated the patient.  Decided to give lidocaine  to see if  this would help.  Patient continued to go in and out of torsades  Patient was eventually taken to cardiac cath to see if there is any kind of ischemic cause of patient's recurrent torsades.  Patient has been having some nausea and vomiting and diarrhea.  Could be the culprit of electrolyte derangements.  Interestingly, patient's phosphorus low at 1.7 and magnesium  1.7 and potassium of 3.7 so no significantly large derangements hemodynamically stable.  Remains A&O x 3 with GCS 15.   Interventions: 2g IV mag, 10 meq K, `1 g calcium , 1 L NS   EKG Interpreted by Me: Torsades, bigem with prolonged qtc    Cardiac Tele Interpreted by Me: Torsades, bigem with prolonged qtc     Social Determinant of Health: denies drugs/alcohol     Disposition and Follow Up: admit    CRITICAL CARE Performed by: Lavonia LOISE Pat   Total critical care time: 45 minutes  Critical care time was exclusive of separately billable procedures and treating other patients.  Critical care was necessary to treat or prevent imminent or life-threatening deterioration.  Critical care was time spent personally by me on the following activities: development of treatment plan with patient and/or surrogate as well as nursing, discussions with consultants, evaluation of patient's response to treatment, examination of patient, obtaining history from patient or surrogate, ordering and performing treatments and interventions, ordering and review of laboratory studies, ordering and review of radiographic studies, pulse oximetry and re-evaluation of patient's condition.      Final diagnoses:  Torsades de pointes [facility])    ED Discharge Orders     None          Pat Lavonia LOISE, MD 03/19/24 2322

## 2024-03-19 NOTE — Progress Notes (Signed)
 eLink Physician-Brief Progress Note Patient Name: Lewis Grivas DOB: 1981/03/27 MRN: [id number]   Date of Service  03/19/2024  HPI/Events of Note  Patient now in ICU on vent. VT 400, RR 18, peep 8, Fio2 100. O2 sat 98. HR in 80s, Hypertensive on art line. Sedated. CCM and cardio notes reviewed. Ongoing electrolyte repletion.   eICU Interventions  CXR and ABG pending  Cards managing VT- has undergone LHC without obvious cause Monitor Qtc Call e link if needed      Intervention Category Major Interventions: Arrhythmia - evaluation and management Evaluation Type: New Patient Evaluation  Cheryll KANDICE Bang 03/19/2024, 11:07 PM

## 2024-03-19 NOTE — H&P (Signed)
 NAME:  Sausha Raymond, MRN:  [id number], DOB:  11-18-1980, LOS: 0 ADMISSION DATE:  03/19/2024, CONSULTATION DATE:  03/19/2024 REFERRING MD: Cindie BROCKS, MD, CHIEF COMPLAINT: N/V/D since am   History of Present Illness:  A 43 yr old female patient with morbid obesity, nocturnal hypoxia on HOT (?OSA), DM-2, HTN (did not refill ARB), dyslipidemia, hypomagnesemia, HIV, GERD, depression/anxiety, and prolonged QT interval due to hypok and hypoMg (admitted in Sep 2025 due to Ozempic  related intractable nausea and vomiting), who presented to ED with N/V/D since today am, every one hour, yellow in color, without melena, blood per rectum, hematemesis, or coffee ground vomitus. She has no abd pain, abd distension, f/c/r, rash, SOB, cough, wheezing, CP, or LL edema. In ED, she has seizures and syncope. Tele showed Torsades de pointes. She was started on Lidocaine  drip due to prolonged Qtc (514 msec). Given 1 L NS 0.9%, Kcl, and Mg IV.  Denied smoking, drugs, and alcohol drinking. On Mounjaro  for DM-2. Took Compazine  x2 for nausea today. She is on fluconazole  for yeast infection prevention and Augmentin for ear infection started one week ago. She is on Prozac  for depression/anxiety. Highlighted meds can prolong the Qtc.    Pertinent  Medical History  Others dx: Idiopathic thrombocytopenic purpura (ITP) (HCC), hepatic steatosis, asthma, seizures (in her 66s due to drugs)  Significant Hospital Events: Including procedures, antibiotic start and stop dates in addition to other pertinent events   03/19/2024, ED: Lidocaine  drip due to prolonged Qtc (514 msec). Given 1 L NS 0.9%, Kcl, and Mg IV. Seen by EP Admit to 2H under PCCM  Interim History / Subjective:    Objective    Blood pressure (!) 152/132, pulse 96, temperature 98.2 F (36.8 C), temperature source Oral, resp. rate 16.        Intake/Output Summary (Last 24 hours) at 03/19/2024 1952 Last data filed at 03/19/2024 1945 Gross per 24 hour  Intake  147.04 ml  Output --  Net 147.04 ml   There were no vitals filed for this visit.  Examination: General: alert, oriented x4, and comfortable. On RA. SpO2 100%  HENT: PERL, normal pharynx and oral mucosa. No LNE or thyromegaly. No JVD Lungs: symmetrical air entry bilaterally. No crackles or wheezing Cardiovascular: NL S1/S2. No m/g/r Abdomen: no distension or tenderness Extremities: no edema. Symmetrical  Neuro: nonfocal    Resolved problem list   Assessment and Plan  Prolonged Qtc due to polypharmacy: Compazine , fluconazole , and Prozac . Admit to ICU Hold these meds [facility] consult Echo NPO Lidocaine  drip  2. ST elevation (aVR, aVL, V1, V2): LHC today Cardiology on the case Echo  ASA, statin FLP in am  3. Morbid obesity  4. HTN, dyslipidemia Resume statin Monitor BP  5. Hypomagnesemia Replace Am labs  6. HIV Resume home med ([name] ) CK LA Lipase level  7. GERD PPI  8. Depression/anxiety Hold Prozac   Resume rest of pysch meds  9. N/V/D probably SE of Mounjaro  IVF I/O chart Hold Mounjaro  for now  UDS  10. DM-2 Glycemic control, 140-180 mg/dl [id number] TSH: NL  11. Ear infection, on Augmentin one week ago Monitor  12. Nocturnal hypoxia on HOT, probably due to undiagnosed OSA   NPSG as outpatient   Labs   CBC: Recent Labs  Lab 03/19/24 1839  WBC 11.7*  NEUTROABS 9.6*  HGB 15.0  HCT 45.5  MCV 101.6*  PLT 312    Basic Metabolic Panel: Recent Labs  Lab 03/19/24 1839  NA 136  K 3.7  CL 96*  CO2 20*  GLUCOSE 134*  BUN <5*  CREATININE 0.75  CALCIUM  8.2*  MG 1.7  PHOS 1.7*   GFR: CrCl cannot be calculated (Unknown ideal weight.). Recent Labs  Lab 03/19/24 1839  WBC 11.7*    Liver Function Tests: Recent Labs  Lab 03/19/24 1839  AST 55*  ALT 48*  ALKPHOS 113  BILITOT 1.2  PROT 7.0  ALBUMIN 3.5   No results for input(s): LIPASE, AMYLASE in the last 168 hours. No results for input(s): AMMONIA in the last  168 hours.  ABG    Component Value Date/Time   HCO3 20.6 01/09/2024 1400   ACIDBASEDEF 2.7 (H) 01/09/2024 1400   O2SAT 84.5 01/09/2024 1400     Coagulation Profile: No results for input(s): INR, PROTIME in the last 168 hours.  Cardiac Enzymes: No results for input(s): CKTOTAL, CKMB, CKMBINDEX, TROPONINI in the last 168 hours.  HbA1C: Hgb A1c MFr Bld  Date/Time Value Ref Range Status  12/03/2023 03:31 PM 4.9 4.8 - 5.6 % Final    Comment:             Prediabetes: 5.7 - 6.4          Diabetes: >6.4          Glycemic control for adults with diabetes: <7.0   06/21/2023 08:13 AM 5.1 4.8 - 5.6 % Final    Comment:    (NOTE) Pre diabetes:          5.7%-6.4%  Diabetes:              >6.4%  Glycemic control for   <7.0% adults with diabetes     CBG: No results for input(s): GLUCAP in the last 168 hours.  Review of Systems:   Review of Systems  Constitutional:  Positive for malaise/fatigue. Negative for chills, diaphoresis, fever and weight loss.  Respiratory:  Negative for cough, hemoptysis, sputum production, shortness of breath and wheezing.   Cardiovascular:  Negative for chest pain, palpitations, orthopnea, claudication, leg swelling and PND.  Gastrointestinal:  Positive for diarrhea, nausea and vomiting. Negative for abdominal pain, blood in stool, constipation, heartburn and melena.  Genitourinary:  Negative for dysuria, frequency and urgency.  Skin:  Negative for itching and rash.  Neurological:  Positive for seizures and loss of consciousness. Negative for sensory change, speech change and focal weakness.  Psychiatric/Behavioral:  Positive for depression. Negative for hallucinations, substance abuse and suicidal ideas. The patient is nervous/anxious.      Past Medical History:  She,  has a past medical history of ADHD, Anxiety, Asthma, Avascular necrosis (HCC), Back pain, Depression, Diabetes mellitus without complication (HCC), Elevated LFTs, Fatty liver  (04/16/2016), GERD (gastroesophageal reflux disease), Heart murmur, History of heat stroke, History of thrombocytopenia, HIV (human immunodeficiency virus infection) (HCC), Hyperlipidemia, Hypertension, Hypothyroidism, Infertility, female, Obese, PCOS (polycystic ovarian syndrome), Pneumonia (04/17/2016), SOB (shortness of breath), Wears partial dentures, and Wrist fracture (11/2017).   Surgical History:   Past Surgical History:  Procedure Laterality Date   BIOPSY  06/22/2023   Procedure: BIOPSY;  Surgeon: Shila Gustav GAILS, MD;  Location: [facility] ENDOSCOPY;  Service: Gastroenterology;;   ESOPHAGOGASTRODUODENOSCOPY (EGD) WITH PROPOFOL  N/A 06/22/2023   Procedure: ESOPHAGOGASTRODUODENOSCOPY (EGD) WITH PROPOFOL ;  Surgeon: Shila Gustav GAILS, MD;  Location: [facility] ENDOSCOPY;  Service: Gastroenterology;  Laterality: N/A;   HYSTEROSCOPY N/A 12/31/2017   Procedure: HYSTEROSCOPY and polypectomy;  Surgeon: Yalcinkaya, Tamer, MD;  Location: [facility];  Service: Gynecology;  Laterality: N/A;  I & D EXTREMITY Left 07/15/2020   Procedure: PARTIAL EXCISION LEFT TIBIA;  Surgeon: Harden Jerona GAILS, MD;  Location: [facility] OR;  Service: Orthopedics;  Laterality: Left;   IR FLUORO GUIDE CV LINE LEFT  09/26/2020   IR RADIOLOGIST EVAL & MGMT  08/12/2020   maxillofacial surgery     OPEN REDUCTION INTERNAL FIXATION (ORIF) FOOT LISFRANC FRACTURE Left 06/11/2023   Procedure: OPEN TREAMENT OF LEFT FIRST, SECOND, FOURTH METATARSALS, MEDIAL CUNEIFORM FRACTURE, CUBOID, FIRST SECOND THRID TARSOMETATARSAL AND LISFRANC JOINT;  Surgeon: Elsa Lonni SAUNDERS, MD;  Location: [facility] OR;  Service: Orthopedics;  Laterality: Left;     Social History:   reports that she has quit smoking. Her smoking use included cigarettes. She has a 4.5 pack-year smoking history. She has never used smokeless tobacco. She reports current alcohol use of about 7.0 standard drinks of alcohol per week. She reports that she does not currently use drugs after having  used the following drugs: Crack cocaine.   Family History:  Her family history includes Depression in her father and mother; Hyperlipidemia in her father; Hypertension in her father; Hypothyroidism in her mother; Obesity in her father and mother; Thyroid disease in her mother.   Allergies Allergies  Allergen Reactions   Cephalexin  Other (See Comments)    Burning face   Citalopram Hydrobromide Other (See Comments)    Felt weird   Hydrocodone -Acetaminophen  Hives and Itching   Latex Other (See Comments)    As a teenager, when using latex condoms, she would get a yeast infection.   Lisinopril Cough   Mucinex  Fast-Max Chest Cong Ms [Guaifenesin ] Nausea And Vomiting   Ancef  [Cefazolin ] Rash     Home Medications  Prior to Admission medications   Medication Sig Start Date End Date Taking? Authorizing Provider  albuterol  (VENTOLIN  HFA) 108 (90 Base) MCG/ACT inhaler INHALE 1 PUFF INTO THE LUNGS EVERY 6 HOURS AS NEEDED FOR WHEEZING OR SHORTNESS OF BREATH Patient taking differently: Inhale 1 puff into the lungs every 6 (six) hours as needed for wheezing or shortness of breath. 06/05/22   Eben Reyes BROCKS, MD  ALPRAZolam  (XANAX ) 1 MG tablet Take 1 tablet (1 mg total) by mouth 3 (three) times daily as needed for anxiety. 08/28/23   Eben Reyes BROCKS, MD  Blood Glucose Monitoring Suppl DEVI 1 each by Does not apply route as directed. Dispense based on patient and insurance preference. Use up to four times daily as directed. (FOR ICD-10 E10.9, E11.9). 02/26/24   Eben Reyes BROCKS, MD  budesonide -formoterol  (SYMBICORT ) 80-4.5 MCG/ACT inhaler Inhale 2 puffs into the lungs daily. Patient taking differently: Inhale 2 puffs into the lungs daily as needed (when sick). 06/12/22   Eben Reyes BROCKS, MD  dolutegravir -lamiVUDine  ([name] ) 50-300 MG tablet Take 1 tablet by mouth every evening. 08/20/23   Eben Reyes BROCKS, MD  FLUoxetine  (PROZAC ) 20 MG capsule Take 20 mg by mouth at bedtime.    [provider]  folic acid  (FOLVITE ) 1 MG tablet Take 1 tablet (1 mg total) by mouth daily. 06/23/23   Masters, Katie, DO  gabapentin  (NEURONTIN ) 300 MG capsule Take 2 capsules (600 mg total) by mouth 3 (three) times daily. Patient not taking: Reported on 01/09/2024 06/30/23   Eben Reyes BROCKS, MD  Glucose Blood (BLOOD GLUCOSE TEST STRIPS) STRP 1 each by Does not apply route as directed. Dispense based on patient and insurance preference. Use up to four times daily as directed. (FOR ICD-10 E10.9, E11.9). 02/26/24   Eben Reyes BROCKS, MD  IMODIUM A-D 2 MG capsule Take 2 mg by mouth as needed for diarrhea or loose stools.    [provider]  Iron , Ferrous Sulfate , 325 (65 Fe) MG TABS Take 1 tablet by mouth daily. Patient taking differently: Take 325 mg by mouth daily after supper. 07/30/23 01/09/24  Eben Reyes BROCKS, MD  Lancet Device MISC 1 each by Does not apply route as directed. Dispense based on patient and insurance preference. Use up to four times daily as directed. (FOR ICD-10 E10.9, E11.9). 02/26/24   Eben Reyes BROCKS, MD  Lancets MISC 1 each by Does not apply route as directed. Dispense based on patient and insurance preference. Use up to four times daily as directed. (FOR ICD-10 E10.9, E11.9). 02/26/24   Eben Reyes BROCKS, MD  lithium  carbonate 150 MG capsule Take 150 mg by mouth 3 (three) times daily with meals.    [provider]  Magnesium  200 MG TABS Take 2 tablets (400 mg total) by mouth daily. 01/11/24   Regalado, Belkys A, MD  naloxone (NARCAN) nasal spray 4 mg/0.1 mL Place 1 spray into the nose once as needed (AS DIRECTED FOR A CRISIS). 09/03/23   [provider]  pantoprazole  (PROTONIX ) 40 MG tablet Take 1 tablet (40 mg total) by mouth daily. Patient taking differently: Take 40 mg by mouth at bedtime. 08/19/23   Eben Reyes BROCKS, MD  polyethylene glycol (MIRALAX  / GLYCOLAX ) 17 g packet Take 17 g by mouth daily as needed for moderate constipation. Patient not  taking: Reported on 01/09/2024 06/22/23   Masters, Izetta, DO  potassium chloride  SA (KLOR-CON  M) 20 MEQ tablet Take 2 tablets (40 mEq total) by mouth daily for 5 doses. 01/11/24 01/16/24  Regalado, Owen A, MD  tiZANidine  (ZANAFLEX ) 4 MG tablet Take 1 tablet (4 mg total) by mouth daily. Patient taking differently: Take 4 mg by mouth at bedtime as needed for muscle spasms. 12/03/23 12/02/24  Eben Reyes BROCKS, MD     Critical care time: 50 min     Mancel Ply, MD [facility] See Amion for personal pager

## 2024-03-19 NOTE — Progress Notes (Signed)
 RT transported pt from Cath lab to 2H2 without complications.

## 2024-03-20 ENCOUNTER — Inpatient Hospital Stay (HOSPITAL_COMMUNITY)

## 2024-03-20 DIAGNOSIS — R55 Syncope and collapse: Secondary | ICD-10-CM | POA: Diagnosis not present

## 2024-03-20 DIAGNOSIS — J9601 Acute respiratory failure with hypoxia: Secondary | ICD-10-CM | POA: Diagnosis not present

## 2024-03-20 DIAGNOSIS — I213 ST elevation (STEMI) myocardial infarction of unspecified site: Secondary | ICD-10-CM | POA: Diagnosis not present

## 2024-03-20 DIAGNOSIS — I4721 Torsades de pointes: Secondary | ICD-10-CM | POA: Diagnosis not present

## 2024-03-20 LAB — POCT I-STAT 7, (LYTES, BLD GAS, ICA,H+H)
Acid-Base Excess: 0 mmol/L (ref 0.0–2.0)
Acid-base deficit: 3 mmol/L — ABNORMAL HIGH (ref 0.0–2.0)
Bicarbonate: 26.2 mmol/L (ref 20.0–28.0)
Bicarbonate: 26.5 mmol/L (ref 20.0–28.0)
Calcium, Ion: 1.02 mmol/L — ABNORMAL LOW (ref 1.15–1.40)
Calcium, Ion: 1.04 mmol/L — ABNORMAL LOW (ref 1.15–1.40)
HCT: 44 % (ref 36.0–46.0)
HCT: 42 % (ref 36.0–46.0)
Hemoglobin: 15 g/dL (ref 12.0–15.0)
Hemoglobin: 14.3 g/dL (ref 12.0–15.0)
O2 Saturation: 99 %
O2 Saturation: 99 %
Patient temperature: 37.9
Patient temperature: 37.3
Potassium: 3.9 mmol/L (ref 3.5–5.1)
Potassium: 4.3 mmol/L (ref 3.5–5.1)
Sodium: 137 mmol/L (ref 135–145)
Sodium: 138 mmol/L (ref 135–145)
TCO2: 28 mmol/L (ref 22–32)
TCO2: 28 mmol/L (ref 22–32)
pCO2 arterial: 63.3 mmHg — ABNORMAL HIGH (ref 32–48)
pCO2 arterial: 50.9 mmHg — ABNORMAL HIGH (ref 32–48)
pH, Arterial: 7.23 — ABNORMAL LOW (ref 7.35–7.45)
pH, Arterial: 7.326 — ABNORMAL LOW (ref 7.35–7.45)
pO2, Arterial: 146 mmHg — ABNORMAL HIGH (ref 83–108)
pO2, Arterial: 132 mmHg — ABNORMAL HIGH (ref 83–108)

## 2024-03-20 LAB — URINALYSIS, ROUTINE W REFLEX MICROSCOPIC
Bacteria, UA: NONE SEEN
Bilirubin Urine: NEGATIVE
Glucose, UA: NEGATIVE mg/dL
Hgb urine dipstick: NEGATIVE
Ketones, ur: NEGATIVE mg/dL
Leukocytes,Ua: NEGATIVE
Nitrite: NEGATIVE
Protein, ur: NEGATIVE mg/dL
Specific Gravity, Urine: 1.038 — ABNORMAL HIGH (ref 1.005–1.030)
pH: 6 (ref 5.0–8.0)

## 2024-03-20 LAB — LIPID PANEL
Cholesterol: 189 mg/dL (ref 0–200)
HDL: 38 mg/dL — ABNORMAL LOW (ref >40)
LDL Cholesterol: 121 mg/dL — ABNORMAL HIGH (ref 0–99)
Total CHOL/HDL Ratio: 5 ratio
Triglycerides: 150 mg/dL — ABNORMAL HIGH (ref <150)
VLDL: 30 mg/dL (ref 0–40)

## 2024-03-20 LAB — GLUCOSE, CAPILLARY
Glucose-Capillary: 124 mg/dL — ABNORMAL HIGH (ref 70–99)
Glucose-Capillary: 121 mg/dL — ABNORMAL HIGH (ref 70–99)
Glucose-Capillary: 86 mg/dL (ref 70–99)
Glucose-Capillary: 97 mg/dL (ref 70–99)
Glucose-Capillary: 100 mg/dL — ABNORMAL HIGH (ref 70–99)
Glucose-Capillary: 104 mg/dL — ABNORMAL HIGH (ref 70–99)

## 2024-03-20 LAB — CBC
HCT: 43.4 % (ref 36.0–46.0)
HCT: 41.4 % (ref 36.0–46.0)
Hemoglobin: 13.8 g/dL (ref 12.0–15.0)
Hemoglobin: 13 g/dL (ref 12.0–15.0)
MCH: 33.1 pg (ref 26.0–34.0)
MCH: 33.4 pg (ref 26.0–34.0)
MCHC: 31.8 g/dL (ref 30.0–36.0)
MCHC: 31.4 g/dL (ref 30.0–36.0)
MCV: 104.1 fL — ABNORMAL HIGH (ref 80.0–100.0)
MCV: 106.4 fL — ABNORMAL HIGH (ref 80.0–100.0)
Platelets: 284 K/uL (ref 150–400)
Platelets: 224 K/uL (ref 150–400)
RBC: 4.17 MIL/uL (ref 3.87–5.11)
RBC: 3.89 MIL/uL (ref 3.87–5.11)
RDW: 13.3 % (ref 11.5–15.5)
RDW: 13.4 % (ref 11.5–15.5)
WBC: 10.1 K/uL (ref 4.0–10.5)
WBC: 10.3 K/uL (ref 4.0–10.5)
nRBC: 0.2 % (ref 0.0–0.2)
nRBC: 0 % (ref 0.0–0.2)

## 2024-03-20 LAB — PHOSPHORUS: Phosphorus: 6.6 mg/dL — ABNORMAL HIGH (ref 2.5–4.6)

## 2024-03-20 LAB — ECHOCARDIOGRAM COMPLETE
AR max vel: 2.5 cm2
AV Area VTI: 2.37 cm2
AV Area mean vel: 2.22 cm2
AV Mean grad: 4 mmHg
AV Peak grad: 6.2 mmHg
Ao pk vel: 1.24 m/s
Area-P 1/2: 3.19 cm2
Est EF: 55
S' Lateral: 3.2 cm
Weight: 3957.7 [oz_av]

## 2024-03-20 LAB — RAPID URINE DRUG SCREEN, HOSP PERFORMED
Amphetamines: NOT DETECTED
Barbiturates: NOT DETECTED
Benzodiazepines: POSITIVE — AB
Cocaine: NOT DETECTED
Opiates: NOT DETECTED
Tetrahydrocannabinol: NOT DETECTED

## 2024-03-20 LAB — COMPREHENSIVE METABOLIC PANEL WITH GFR
ALT: 37 U/L (ref 0–44)
AST: 33 U/L (ref 15–41)
Albumin: 3 g/dL — ABNORMAL LOW (ref 3.5–5.0)
Alkaline Phosphatase: 103 U/L (ref 38–126)
Anion gap: 10 (ref 5–15)
BUN: <5 mg/dL — ABNORMAL LOW (ref 6–20)
CO2: 26 mmol/L (ref 22–32)
Calcium: 7.6 mg/dL — ABNORMAL LOW (ref 8.9–10.3)
Chloride: 101 mmol/L (ref 98–111)
Creatinine, Ser: 0.49 mg/dL (ref 0.44–1.00)
GFR, Estimated: >60 mL/min (ref >60)
Glucose, Bld: 127 mg/dL — ABNORMAL HIGH (ref 70–99)
Potassium: 4.1 mmol/L (ref 3.5–5.1)
Sodium: 137 mmol/L (ref 135–145)
Total Bilirubin: 1.3 mg/dL — ABNORMAL HIGH (ref 0.0–1.2)
Total Protein: 6.3 g/dL — ABNORMAL LOW (ref 6.5–8.1)

## 2024-03-20 LAB — MRSA NEXT GEN BY PCR, NASAL: MRSA by PCR Next Gen: DETECTED — AB

## 2024-03-20 LAB — MAGNESIUM: Magnesium: 2.7 mg/dL — ABNORMAL HIGH (ref 1.7–2.4)

## 2024-03-20 LAB — TRIGLYCERIDES
Triglycerides: 161 mg/dL — ABNORMAL HIGH (ref <150)
Triglycerides: 201 mg/dL — ABNORMAL HIGH (ref <150)

## 2024-03-20 LAB — LACTIC ACID, PLASMA: Lactic Acid, Venous: 1.9 mmol/L (ref 0.5–1.9)

## 2024-03-20 LAB — CK: Total CK: 84 U/L (ref 38–234)

## 2024-03-20 LAB — LIPASE, BLOOD: Lipase: 24 U/L (ref 11–51)

## 2024-03-20 LAB — LIDOCAINE LEVEL: Lidocaine Lvl: 3 ug/mL (ref 1.5–5.0)

## 2024-03-20 LAB — TROPONIN I (HIGH SENSITIVITY): Troponin I (High Sensitivity): 26 ng/L — ABNORMAL HIGH (ref <18)

## 2024-03-20 MED ORDER — POTASSIUM CHLORIDE CRYS ER 20 MEQ PO TBCR: 20.0000 meq | EXTENDED_RELEASE_TABLET | Freq: Once | ORAL | Status: DC

## 2024-03-20 MED ORDER — ARFORMOTEROL TARTRATE 15 MCG/2ML IN NEBU
15.0000 ug | INHALATION_SOLUTION | Freq: Two times a day (BID) | RESPIRATORY_TRACT | Status: DC
  Administered 2024-03-20 – 2024-03-23 (×7): 15 ug via RESPIRATORY_TRACT
  Filled 2024-03-20 (×7): qty 2

## 2024-03-20 MED ORDER — MUPIROCIN 2 % EX OINT
1.0000 | TOPICAL_OINTMENT | Freq: Two times a day (BID) | CUTANEOUS | Status: DC
  Administered 2024-03-20 – 2024-03-23 (×8): 1 via NASAL
  Filled 2024-03-20: qty 22

## 2024-03-20 MED ORDER — ACETAMINOPHEN 325 MG PO TABS
650.0000 mg | ORAL_TABLET | Freq: Four times a day (QID) | ORAL | Status: DC | PRN
  Administered 2024-03-20 – 2024-03-21 (×3): 650 mg via ORAL
  Filled 2024-03-20 (×3): qty 2

## 2024-03-20 MED ORDER — POTASSIUM CHLORIDE 10 MEQ/100ML IV SOLN
10.0000 meq | Freq: Once | INTRAVENOUS | Status: AC
  Administered 2024-03-20: 10 meq via INTRAVENOUS
  Filled 2024-03-20: qty 100

## 2024-03-20 MED ORDER — ORAL CARE MOUTH RINSE: 15.0000 mL | OROMUCOSAL | Status: DC | PRN

## 2024-03-20 MED ORDER — BUDESONIDE 0.5 MG/2ML IN SUSP
0.5000 mg | Freq: Two times a day (BID) | RESPIRATORY_TRACT | Status: DC
  Administered 2024-03-20 – 2024-03-23 (×7): 0.5 mg via RESPIRATORY_TRACT
  Filled 2024-03-20 (×7): qty 2

## 2024-03-20 MED ORDER — POTASSIUM CHLORIDE 20 MEQ PO PACK
20.0000 meq | PACK | Freq: Once | ORAL | Status: AC
  Administered 2024-03-20: 20 meq
  Filled 2024-03-20: qty 1

## 2024-03-20 MED ORDER — ALPRAZOLAM 0.5 MG PO TABS
1.0000 mg | ORAL_TABLET | Freq: Three times a day (TID) | ORAL | Status: DC | PRN
  Administered 2024-03-20 – 2024-03-21 (×3): 1 mg
  Filled 2024-03-20 (×4): qty 2

## 2024-03-20 MED ORDER — ORAL CARE MOUTH RINSE
15.0000 mL | OROMUCOSAL | Status: DC
  Administered 2024-03-20 – 2024-03-21 (×16): 15 mL via OROMUCOSAL

## 2024-03-20 MED ORDER — PERFLUTREN LIPID MICROSPHERE
1.0000 mL | INTRAVENOUS | Status: AC | PRN
  Administered 2024-03-20: 2 mL via INTRAVENOUS

## 2024-03-20 NOTE — H&P (Signed)
   NAME:  Olita Takeshita, MRN:  [id number], DOB:  Aug 27, 1980, LOS: 1 ADMISSION DATE:  03/19/2024, CONSULTATION DATE:  03/19/2024 REFERRING MD: Cindie BROCKS, MD, CHIEF COMPLAINT: N/V/D since am   History of Present Illness:  A 43 yr old female patient with morbid obesity, nocturnal hypoxia on HOT (?OSA), DM-2, HTN (did not refill ARB), dyslipidemia, hypomagnesemia, HIV, GERD, depression/anxiety, and prolonged QT interval due to hypok and hypoMg (admitted in Sep 2025 due to Ozempic  related intractable nausea and vomiting), who presented to ED with N/V/D since today am, every one hour, yellow in color, without melena, blood per rectum, hematemesis, or coffee ground vomitus. She has no abd pain, abd distension, f/c/r, rash, SOB, cough, wheezing, CP, or LL edema. In ED, she has seizures and syncope. Tele showed Torsades de pointes. She was started on Lidocaine  drip due to prolonged Qtc (514 msec). Given 1 L NS 0.9%, Kcl, and Mg IV.  Denied smoking, drugs, and alcohol drinking. On Mounjaro  for DM-2. Took Compazine  x2 for nausea today. She is on fluconazole  for yeast infection prevention and Augmentin for ear infection started one week ago. She is on Prozac  for depression/anxiety. Highlighted meds can prolong the Qtc.    Pertinent  Medical History  Others dx: Idiopathic thrombocytopenic purpura (ITP) (HCC), hepatic steatosis, asthma, seizures (in her 20s due to drugs)  Significant Hospital Events: Including procedures, antibiotic start and stop dates in addition to other pertinent events   03/19/2024, ED: Lidocaine  drip due to prolonged Qtc (514 msec). Given 1 L NS 0.9%, Kcl, and Mg IV. Seen by EP Admit to 2H under PCCM  Interim History / Subjective:  No events, sedated on vent but not happy with AC  Objective    Blood pressure 136/79, pulse 70, temperature 99 F (37.2 C), resp. rate 14, weight 112.2 kg, SpO2 99%.    Vent Mode: SIMV;PRVC;PSV FiO2 (%):  [40 %-100 %] 40 % Set Rate:  [14 bmp-18 bmp] 14  bmp Vt Set:  [400 mL] 400 mL PEEP:  [5 cmH20-8 cmH20] 5 cmH20 Plateau Pressure:  [17 cmH20-20 cmH20] 20 cmH20   Intake/Output Summary (Last 24 hours) at 03/20/2024 0759 Last data filed at 03/20/2024 0700 Gross per 24 hour  Intake 1338.96 ml  Output 975 ml  Net 363.96 ml   Filed Weights   03/19/24 2338 03/20/24 0500  Weight: 112.2 kg 112.2 kg    Examination: Pinpoint pupils, cranial nerves all intact but RASS -4 Ext warm Lungs/heart sounds distant secondary to body habitus ~4 breaths per minute with heavy pull bypassing ett cuff leading to leak alarm Abd soft, hypoactive BS  Labs and imaging reviewed  Resolved problem list   Assessment and Plan  Polypharmacy induced torsades- neg coronaries Need for mechanical ventilation to reduce sympathetic drive Class 3 obesity Bipolar HIV GERD HTN Hypothyroidism PCOS Depression Reactive Airway disease NASH  EP would like heavy sedation and vent support today Hold QT prolonging agents Tough to get her compliant with AC, may have to liberalize LTVV as she is not ARDS she is just airway protection for heavy sedative need Continue ARV, PPI Hopefully extubate tomorrow am if tele benign and QT looks better  32 min cc time Rolan Sharps MD PCCM

## 2024-03-20 NOTE — Progress Notes (Signed)
 eLink Physician-Brief Progress Note Patient Name: Lindsay French DOB: 1980-04-25 MRN: [id number]   Date of Service  03/20/2024  HPI/Events of Note  Admitted for polypharmacy induced torsades Bedside tele with Qtc ~600 ms Repeat EKG with Qtc 588  eICU Interventions  Holding QT prolonging meds since admission No changes. Remain sedated on propofol      Intervention Category Intermediate Interventions: Arrhythmia - evaluation and management  [name] [name] 03/20/2024, 10:01 PM

## 2024-03-20 NOTE — Progress Notes (Incomplete)
 Cardiothoracic Surgery Nursing Report Patient: [name] @ Attending: @ATTENDING @  Brief Hx:  43 y.o. female POD ***s/p ***  ---------------------------------------------------------------------------------------------------------------------------------------------------- Neurologic {TCTSNEURO:34127::No deficits:0} Glasgow Coma Scale Score: 12 [place] Agitation Sedation Scale (RASS): Agitated Dose (mg/kg/hr) Propofol  : 2.7 mg/kg/hr Dose (mcg/hr) Fentanyl : *100 mcg Dose (mg/hr) Midazolam : 0 mg/hr    Plan: ***   Cardiovascular BP: 114/69 MAP (mmHg): 82 Arterial Line BP: 123/70 Arterial Line MAP (mmHg): 88 mmHg Pulse Rate: 72       Plan: ***   Pulmonary  SpO2: 99 % O2 Device: Ventilator   Plan: ***   Renal:   Intake/Output Summary (Last 24 hours) at 03/20/2024 1637 Last data filed at 03/20/2024 1600 Gross per 24 hour  Intake 1744.78 ml  Output 1095 ml  Net 649.78 ml    Recent Labs    03/19/24 1839 03/20/24 0525  CREATININE 0.75 0.49    Plan: ***   Heme/ID/DVT Prophylaxis:  Hemoglobin & Hematocrit  Recent Labs    03/20/24 0525 03/20/24 0954 03/20/24 1128 03/20/24 1225  WBC 10.1  --  10.3  --   HGB 13.8   < > 13.0 14.3  HCT 43.4   < > 41.4 42.0  PLT 284  --  224  --    < > = values in this interval not displayed.       Component Value Date/Time   INR 1.1 07/11/2020 2219       Plan: ***   GI:  Diet Orders (From admission, onward)     Start     Ordered   03/19/24 2258  Diet NPO time specified  Diet effective now        03/19/24 2257            CBG (last 3)  Recent Labs    03/20/24 0710 03/20/24 1315 03/20/24 1512  GLUCAP 121* 86 97    Plan: ***   Dispo: Ambulation: *** PT/OT: *** PT Orders:   PT Follow up Rec:   Plan: ***

## 2024-03-20 NOTE — Progress Notes (Addendum)
 Advanced Heart Failure Rounding Note  Cardiologist: None  Chief Complaint: Polymorphic VT/Torsades Subjective:   Admitted overnight with polymorphic VT/Torsades suspect in the setting of polypharmacy (compazine , prozac , fluconazole  and lithium ).   LHC with normal coronaries.   On lidocaine  gtt.   Awake on vent, nods appropriately.   Objective:   Weight Range: 112.2 kg Body mass index is 45.24 kg/m.   Vital Signs:   Temp:  [97.9 F (36.6 C)-99 F (37.2 C)] 99 F (37.2 C) (11/28 0700) Pulse Rate:  [63-174] 70 (11/28 0700) Resp:  [9-44] 14 (11/28 0700) BP: (95-195)/(60-158) 136/79 (11/28 0700) SpO2:  [90 %-100 %] 99 % (11/28 0702) Arterial Line BP: (89-200)/(57-117) 121/78 (11/28 0700) FiO2 (%):  [40 %-100 %] 40 % (11/28 0702) Weight:  [112.2 kg] 112.2 kg (11/28 0500) Last BM Date : 03/19/24  Weight change: Filed Weights   03/19/24 2338 03/20/24 0500  Weight: 112.2 kg 112.2 kg    Intake/Output:   Intake/Output Summary (Last 24 hours) at 03/20/2024 0721 Last data filed at 03/20/2024 0700 Gross per 24 hour  Intake 1338.96 ml  Output 975 ml  Net 363.96 ml    Physical Exam  General:  intubated, critically ill appearing Neck: JVD difficult to see.  Cor: Regular rate & rhythm. No murmurs. Lungs: clear Extremities: no edema  Neuro: nods appropriately to questions.   Telemetry   NSR 80s (Personally reviewed)    Labs    CBC Recent Labs    03/19/24 1839 03/19/24 2128 03/19/24 2315 03/20/24 0525  WBC 11.7*  --  13.0* 10.1  NEUTROABS 9.6*  --   --   --   HGB 15.0   < > 14.5 13.8  HCT 45.5   < > 43.4 43.4  MCV 101.6*  --  99.1 104.1*  PLT 312  --  295 284   < > = values in this interval not displayed.   Basic Metabolic Panel Recent Labs    88/72/74 1839 03/19/24 2128 03/19/24 2303 03/20/24 0525  NA 136   < > 140 137  K 3.7   < > 3.7 4.1  CL 96*  --   --  101  CO2 20*  --   --  26  GLUCOSE 134*  --   --  127*  BUN <5*  --   --  <5*   CREATININE 0.75  --   --  0.49  CALCIUM  8.2*  --   --  7.6*  MG 1.7  --   --  2.7*  PHOS 1.7*  --   --  6.6*   < > = values in this interval not displayed.   Liver Function Tests Recent Labs    03/19/24 1839 03/20/24 0525  AST 55* 33  ALT 48* 37  ALKPHOS 113 103  BILITOT 1.2 1.3*  PROT 7.0 6.3*  ALBUMIN 3.5 3.0*   Recent Labs    03/19/24 2315  LIPASE 24   Cardiac Enzymes Recent Labs    03/19/24 2315  CKTOTAL 84    BNP: BNP (last 3 results) No results for input(s): BNP in the last 8760 hours.  ProBNP (last 3 results) No results for input(s): PROBNP in the last 8760 hours.   D-Dimer No results for input(s): DDIMER in the last 72 hours. Hemoglobin A1C No results for input(s): HGBA1C in the last 72 hours. Fasting Lipid Panel Recent Labs    03/20/24 0525  CHOL 189  HDL 38*  LDLCALC 121*  TRIG  150*  161*  CHOLHDL 5.0   Thyroid Function Tests Recent Labs    03/19/24 1838  TSH 1.641    Other results:   Imaging    DG Abd Portable 1V Result Date: 03/20/2024 EXAM: 1 VIEW XRAY OF THE ABDOMEN 03/20/2024 04:02:00 AM COMPARISON: None available. CLINICAL HISTORY: [id number] Encounter for orogastric (OG) tube placement [id number] Encounter for orogastric (OG) tube placement FINDINGS: LINES, TUBES AND DEVICES: An enteric catheter is present with its tip in the distal stomach. A cardiac pacer lead is present which has been placed via an inferior approach. BOWEL: Nonobstructive bowel gas pattern. SOFT TISSUES: No opaque urinary calculi. BONES: No acute osseous abnormality. HEART AND MEDIASTINUM: The heart is mildly enlarged. IMPRESSION: 1. Enteric catheter tip in the distal stomach. 2. Mildly enlarged heart with cardiac pacer lead in place via an inferior approach. Electronically signed by: Evalene Coho MD 03/20/2024 04:32 AM EST RP Workstation: [id number]   DG Chest Port 1 View Result Date: 03/19/2024 EXAM: 1 VIEW(S) XRAY OF THE CHEST 03/19/2024 11:13:00 PM  COMPARISON: 11/09/2020 CLINICAL HISTORY: Check endotracheal tube placement. FINDINGS: LINES, TUBES AND DEVICES: The endotracheal tube is seen 3.5 cm above the carina. LUNGS AND PLEURA: Lungs are hypoinflated but clear. No pleural effusion. No pneumothorax. HEART AND MEDIASTINUM: Cardiac shadow is accentuated by the portable technique. BONES AND SOFT TISSUES: No bony abnormality is noted. IMPRESSION: 1. Endotracheal tube appropriately positioned, approximately 3.5 cm above the carina. 2. Low lung volumes without focal consolidation or edema. Electronically signed by: Oneil Devonshire MD 03/19/2024 11:19 PM EST RP Workstation: [id number]   CARDIAC CATHETERIZATION Result Date: 03/19/2024 Table formatting from the original result was not included. Images from the original result were not included.   There is hyperdynamic left ventricular systolic function.  The left ventricular ejection fraction is greater than 65% by visual estimate.  LV end diastolic pressure is mildly elevated.   Hemodynamic findings consistent with mild pulmonary hypertension.   There is no aortic valve stenosis. Dominance: Right     Left Ventriculography:EF > 65%, hyperdynamic FINDINGS Incessant Recurrent Polymorphic Ventricular Tachycardia/Torsades upon required defibrillation x 1 otherwise with self terminating Angiographically Normal Coronary Arteries Mild Pulmonary Hypertension with mean PAP 29 mmHg and a PCWP of 19 mm/LVEDP of 16 mmHg. Hyperdynamic left ventricular function EF roughly 65 to 70%, Cardiac Output and Index also elevated. Fick 9.9-4.66; thermodilution 8.01-3.77 with AF sat 100% PA sat of 86% on ventilator   RECOMMENDATIONS Admit to CVICU on Versed  and fentanyl  drip. Arterial oxygen in place, can continue to use for monitoring pressures and oxygen saturations until adequate alternative access of obtained. Temporary wire is in place localized in the RV set up to a backup pace at 100 bpm at 5 MA if turned on.  Currently off. Continue  to replete electrolytes Alm Clay, MD     Medications:     Scheduled Medications:  aspirin   81 mg Per Tube Daily   atorvastatin   80 mg Per Tube Daily   Chlorhexidine  Gluconate Cloth  6 each Topical Daily   docusate  100 mg Per Tube BID   [START ON 03/21/2024] dolutegravir -lamiVUDine   1 tablet Oral QPM   insulin  aspart  0-9 Units Subcutaneous Q4H   lidocaine   100 mg Intravenous Once   mupirocin  ointment  1 Application Nasal BID   mouth rinse  15 mL Mouth Rinse Q2H   pantoprazole  (PROTONIX ) IV  40 mg Intravenous Q24H   polyethylene glycol  17 g Per Tube Daily   rocuronium   sodium chloride  flush  10-40 mL Intracatheter Q12H   sodium chloride  flush  3 mL Intravenous Q12H    Infusions:  sodium chloride  Stopped (03/20/24 0047)   sodium chloride      fentaNYL  infusion INTRAVENOUS 150 mcg/hr (03/20/24 0700)   lactated ringers      lidocaine  1 mg/min (03/19/24 1929)   propofol  (DIPRIVAN ) infusion 15 mcg/kg/min (03/20/24 0700)    PRN Medications: sodium chloride , fentaNYL , mouth rinse, rocuronium , sodium chloride  flush, sodium chloride  flush    Patient Profile   Lindsay French is a 43 y.o. female with morbid obesity, nocturnal hypoxia, DMII, HTN, dyslipidemia, HIV, GERD, depression/anxiety and prolonged QT (9/25 admitted with low K/Mg 2/2 ozempic ). Admitted N/V/D> polymorphic VT/Torsades.   Assessment/Plan   Polymorphic VT - Suspect in the setting of polypharmacy - L/RHC with normal coronaries, mild PAH, EF 65-70%, PCWP 19, LVEDP 16. Fick CO/CI 9.9/4.66, TD CO/CI 8/3.77.  - EP following - Continue lidocaine  - QTc 514 on last EKG - Intubated and sedated to help with suppression. Plan to keep this way for additional 24 hrs per EP.  - Nephrology consulted, opted against HD - Keep K>4, Mg>2  ST elevation  - LHC with normal coronaries - HsTrop 4>26. Suspect demand ischemia from arrhythmia.  - Continue ASA/statin  N/V/D - suspect with polypharmacy and arrythmia -  resolved  CRITICAL CARE Performed by: Beckey LITTIE Coe   Total critical care time: 12 minutes  Critical care time was exclusive of separately billable procedures and treating other patients.  Critical care was necessary to treat or prevent imminent or life-threatening deterioration.  Critical care was time spent personally by me on the following activities: development of treatment plan with patient and/or surrogate as well as nursing, discussions with consultants, evaluation of patient's response to treatment, examination of patient, obtaining history from patient or surrogate, ordering and performing treatments and interventions, ordering and review of laboratory studies, ordering and review of radiographic studies, pulse oximetry and re-evaluation of patient's condition.   Length of Stay: 1  Beckey LITTIE Coe, NP  03/20/2024, 7:21 AM  Advanced Heart Failure Team Pager [phone] (M-F; 7a - 5p)  Please contact [facility] for night-coverage after hours (5p -7a ) and weekends on [email or web address]  Agree with above.   Awake on vent. Remains on lido. Rhythm stable Qtc improving.   General:  Awake on vent HEENT: normal + ETT Neck: supple. thick Cor: Regular rate & rhythm. No rubs, gallops or murmurs. Lungs: clear Abdomen: soft, nontender, nondistended.Good bowel sounds. Extremities: no cyanosis, clubbing, rash, edema + R groin TVP, art line Neuro: awake on vent follows commands  Rhythm much more stable. Continue lidocaine . Leave intubated for at least another 24 hours.   Avoid all QT prolonging meds.   D/w EP   CRITICAL CARE Performed by: Cherrie Sieving  Total critical care time: 37 minutes  Critical care time was exclusive of separately billable procedures and treating other patients.  Critical care was necessary to treat or prevent imminent or life-threatening deterioration.  Critical care was time spent personally by me (independent of midlevel providers or residents) on the  following activities: development of treatment plan with patient and/or surrogate as well as nursing, discussions with consultants, evaluation of patient's response to treatment, examination of patient, obtaining history from patient or surrogate, ordering and performing treatments and interventions, ordering and review of laboratory studies, ordering and review of radiographic studies, pulse oximetry and re-evaluation of patient's condition.  Sieving Cherrie, MD  9:04 AM

## 2024-03-20 NOTE — Progress Notes (Signed)
  Patient Name: Lindsay French Date of Encounter: 03/20/2024  Primary Cardiologist: None Electrophysiologist: None  Interval Summary   NAEO. S/p L/RHC. Rhythm has settled down. Awake on vent.   Vital Signs    Vitals:   03/20/24 0630 03/20/24 0645 03/20/24 0700 03/20/24 0702  BP: 106/71 105/78 136/79   Pulse: 69 72 70   Resp: 10 13 14    Temp: 98.8 F (37.1 C) 99 F (37.2 C) 99 F (37.2 C)   TempSrc:      SpO2: 98% 98% 99% 99%  Weight:        Intake/Output Summary (Last 24 hours) at 03/20/2024 0755 Last data filed at 03/20/2024 0700 Gross per 24 hour  Intake 1338.96 ml  Output 975 ml  Net 363.96 ml   Filed Weights   03/19/24 2338 03/20/24 0500  Weight: 112.2 kg 112.2 kg    Physical Exam    GEN- The patient is intubated. Follows commands  HEENT- + ET tube.  Lungs- +mechanical breathing sounds.  Heart- Regular rate and rhythm, no murmurs, rubs or gallops GI- soft Extremities- no clubbing or cyanosis. No edema Skin- no rash or lesion  Telemetry    NSR 60-70s, prolonged QT (personally reviewed)  Hospital Course    Lindsay French is a 43 y.o. female admitted for PMVT. Past medical history includes HIV (reportedly undetectable viral load, torsades, alcohol abuse, and anxiety.  Assessment & Plan    Recurrent PMVT Likely in setting of several QT prolonging agents (Compazine , fluconazole ).  Cath with normal coronaries.  Would leave intubated for 48-72 hrs as meds continue to wash out.  Potassium4.1 (11/28 0525) Magnesium   2.7* (11/28 0525) Creatinine, ser  0.49 (11/28 0525) Keep K > 4.0 and Mg > 2.0    For questions or updates, please contact [facility] Please consult [email or web address] for contact info under     Signed, Ozell Prentice Passey, PA-C  03/20/2024, 7:55 AM

## 2024-03-21 DIAGNOSIS — J9601 Acute respiratory failure with hypoxia: Secondary | ICD-10-CM | POA: Diagnosis not present

## 2024-03-21 DIAGNOSIS — I4721 Torsades de pointes: Secondary | ICD-10-CM | POA: Diagnosis not present

## 2024-03-21 LAB — CBC
HCT: 38.5 % (ref 36.0–46.0)
Hemoglobin: 12.3 g/dL (ref 12.0–15.0)
MCH: 33.4 pg (ref 26.0–34.0)
MCHC: 31.9 g/dL (ref 30.0–36.0)
MCV: 104.6 fL — ABNORMAL HIGH (ref 80.0–100.0)
Platelets: 196 K/uL (ref 150–400)
RBC: 3.68 MIL/uL — ABNORMAL LOW (ref 3.87–5.11)
RDW: 13.2 % (ref 11.5–15.5)
WBC: 8.9 K/uL (ref 4.0–10.5)
nRBC: 0 % (ref 0.0–0.2)

## 2024-03-21 LAB — HEMOGLOBIN A1C
Hgb A1c MFr Bld: 4.8 % (ref 4.8–5.6)
Mean Plasma Glucose: 91 mg/dL

## 2024-03-21 LAB — GLUCOSE, CAPILLARY
Glucose-Capillary: 105 mg/dL — ABNORMAL HIGH (ref 70–99)
Glucose-Capillary: 115 mg/dL — ABNORMAL HIGH (ref 70–99)
Glucose-Capillary: 119 mg/dL — ABNORMAL HIGH (ref 70–99)
Glucose-Capillary: 124 mg/dL — ABNORMAL HIGH (ref 70–99)
Glucose-Capillary: 84 mg/dL (ref 70–99)
Glucose-Capillary: 94 mg/dL (ref 70–99)

## 2024-03-21 LAB — COMPREHENSIVE METABOLIC PANEL WITH GFR
ALT: 28 U/L (ref 0–44)
AST: 29 U/L (ref 15–41)
Albumin: 2.8 g/dL — ABNORMAL LOW (ref 3.5–5.0)
Alkaline Phosphatase: 94 U/L (ref 38–126)
Anion gap: 11 (ref 5–15)
BUN: <5 mg/dL — ABNORMAL LOW (ref 6–20)
CO2: 22 mmol/L (ref 22–32)
Calcium: 7.1 mg/dL — ABNORMAL LOW (ref 8.9–10.3)
Chloride: 101 mmol/L (ref 98–111)
Creatinine, Ser: 0.63 mg/dL (ref 0.44–1.00)
GFR, Estimated: >60 mL/min (ref >60)
Glucose, Bld: 103 mg/dL — ABNORMAL HIGH (ref 70–99)
Potassium: 3.6 mmol/L (ref 3.5–5.1)
Sodium: 134 mmol/L — ABNORMAL LOW (ref 135–145)
Total Bilirubin: 1.2 mg/dL (ref 0.0–1.2)
Total Protein: 5.7 g/dL — ABNORMAL LOW (ref 6.5–8.1)

## 2024-03-21 LAB — PHOSPHORUS: Phosphorus: 2.7 mg/dL (ref 2.5–4.6)

## 2024-03-21 LAB — MAGNESIUM: Magnesium: 2.2 mg/dL (ref 1.7–2.4)

## 2024-03-21 LAB — LIDOCAINE LEVEL: Lidocaine Lvl: 3.4 ug/mL (ref 1.5–5.0)

## 2024-03-21 MED ORDER — POTASSIUM CHLORIDE 20 MEQ PO PACK
40.0000 meq | PACK | Freq: Once | ORAL | Status: AC
  Administered 2024-03-21: 40 meq
  Filled 2024-03-21: qty 2

## 2024-03-21 MED ORDER — DOCUSATE SODIUM 50 MG/5ML PO LIQD: 100.0000 mg | Freq: Two times a day (BID) | ORAL | Status: DC

## 2024-03-21 MED ORDER — ALPRAZOLAM 0.5 MG PO TABS
1.0000 mg | ORAL_TABLET | Freq: Three times a day (TID) | ORAL | Status: DC | PRN
  Administered 2024-03-21 – 2024-03-23 (×3): 1 mg via ORAL
  Filled 2024-03-21 (×2): qty 2

## 2024-03-21 MED ORDER — NYSTATIN 100000 UNIT/ML MT SUSP
5.0000 mL | Freq: Four times a day (QID) | OROMUCOSAL | Status: DC
  Administered 2024-03-21 – 2024-03-22 (×2): 500000 [IU] via ORAL
  Filled 2024-03-21 (×3): qty 5

## 2024-03-21 MED ORDER — HYDROMORPHONE HCL 1 MG/ML IJ SOLN
1.0000 mg | INTRAMUSCULAR | Status: DC | PRN
  Administered 2024-03-21 – 2024-03-22 (×3): 1 mg via INTRAVENOUS
  Filled 2024-03-21 (×3): qty 1

## 2024-03-21 MED ORDER — HYDRALAZINE HCL 20 MG/ML IJ SOLN: 5.0000 mg | INTRAMUSCULAR | Status: DC | PRN

## 2024-03-21 MED ORDER — AMLODIPINE BESYLATE 5 MG PO TABS
5.0000 mg | ORAL_TABLET | Freq: Every day | ORAL | Status: DC
  Administered 2024-03-21 – 2024-03-23 (×3): 5 mg via ORAL
  Filled 2024-03-21 (×3): qty 1

## 2024-03-21 MED ORDER — ORAL CARE MOUTH RINSE: 15.0000 mL | OROMUCOSAL | Status: DC | PRN

## 2024-03-21 MED ORDER — POLYETHYLENE GLYCOL 3350 17 G PO PACK: 17.0000 g | PACK | Freq: Every day | ORAL | Status: DC

## 2024-03-21 MED ORDER — ATORVASTATIN CALCIUM 80 MG PO TABS
80.0000 mg | ORAL_TABLET | Freq: Every day | ORAL | Status: DC
  Administered 2024-03-22 – 2024-03-23 (×2): 80 mg via ORAL
  Filled 2024-03-21 (×2): qty 1

## 2024-03-21 MED ORDER — ASPIRIN 81 MG PO CHEW
81.0000 mg | CHEWABLE_TABLET | Freq: Every day | ORAL | Status: DC
  Administered 2024-03-22 – 2024-03-23 (×2): 81 mg via ORAL
  Filled 2024-03-21 (×2): qty 1

## 2024-03-21 MED ORDER — DEXMEDETOMIDINE HCL IN NACL 400 MCG/100ML IV SOLN
0.0000 ug/kg/h | INTRAVENOUS | Status: DC
  Administered 2024-03-21: 0.4 ug/kg/h via INTRAVENOUS
  Filled 2024-03-21: qty 100

## 2024-03-21 NOTE — Progress Notes (Addendum)
 eLink Physician-Brief Progress Note Patient Name: Lindsay French DOB: [birth year] MRN: [id number]   Date of Service  03/21/2024  HPI/Events of Note  Oral thrush, s/p extubation status.    eICU Interventions  Nystatin  oral 5 ml qID for 3 days, aspiration precautions.      Intervention Category Minor Interventions: Other:  [name] 03/21/2024, 9:07 PM  20:02 pt c/o 6/10 neuropathy pain to both feet, pt says at one time was taking gabapentin - don't see on home list, able to take PO's Polymorphic VT in setting of QT prolongation  - discussed with RN, can not give gabapentin  due to qtc prolongation.  0250: c/o sore throat, asking for chlorseptic spray - ordered

## 2024-03-21 NOTE — Plan of Care (Signed)
  Problem: Coping: Goal: Level of anxiety will decrease Outcome: Progressing   Problem: Elimination: Goal: Will not experience complications related to bowel motility Outcome: Progressing Goal: Will not experience complications related to urinary retention Outcome: Progressing   Problem: Pain Managment: Goal: General experience of comfort will improve and/or be controlled Outcome: Progressing   Problem: Safety: Goal: Ability to remain free from injury will improve Outcome: Progressing   Problem: Skin Integrity: Goal: Risk for impaired skin integrity will decrease Outcome: Progressing   Problem: Respiratory: Goal: Ability to maintain a clear airway and adequate ventilation will improve Outcome: Progressing   Problem: Cardiovascular: Goal: Ability to achieve and maintain adequate cardiovascular perfusion will improve Outcome: Progressing Goal: Vascular access site(s) Level 0-1 will be maintained Outcome: Progressing

## 2024-03-21 NOTE — Progress Notes (Signed)
 Advanced Heart Failure Rounding Note  Cardiologist: None  Chief Complaint: Polymorphic VT/Torsades Subjective:   Admitted with polymorphic VT/Torsades suspect in the setting of polypharmacy (compazine , prozac , fluconazole  and lithium ).   LHC with normal coronaries.   Awake on vent (weaning). No further VT on lido drip @ 1. Lido level 3.0 yesterday. None yet today   Objective:   Weight Range: 111.5 kg Body mass index is 44.96 kg/m.   Vital Signs:   Temp:  [99 F (37.2 C)-100.2 F (37.9 C)] 100 F (37.8 C) (11/29 0830) Pulse Rate:  [59-80] 67 (11/29 0830) Resp:  [10-23] 14 (11/29 0830) BP: (84-134)/(55-82) 127/61 (11/29 0830) SpO2:  [94 %-100 %] 99 % (11/29 0830) Arterial Line BP: (68-129)/(49-93) 93/57 (11/29 0830) FiO2 (%):  [40 %] 40 % (11/29 0307) Weight:  [111.5 kg] 111.5 kg (11/29 0428) Last BM Date : 03/19/24  Weight change: Filed Weights   03/19/24 2338 03/20/24 0500 03/21/24 0428  Weight: 112.2 kg 112.2 kg 111.5 kg    Intake/Output:   Intake/Output Summary (Last 24 hours) at 03/21/2024 0859 Last data filed at 03/21/2024 0800 Gross per 24 hour  Intake 1596.46 ml  Output 780 ml  Net 816.46 ml    Physical Exam   General: Awake on vent HEENT: normal + ETT Neck: supple. thick Cor: Regular rate & rhythm. No rubs, gallops or murmurs. Lungs: clear Abdomen: obese soft, nontender, nondistended.Good bowel sounds. Extremities: no cyanosis, clubbing, rash, edema Neuro: awake on vent follows commands   Telemetry   NSR 80s (Personally reviewed)    Labs    CBC Recent Labs    03/19/24 1839 03/19/24 2128 03/20/24 1128 03/20/24 1225 03/21/24 0414  WBC 11.7*   < > 10.3  --  8.9  NEUTROABS 9.6*  --   --   --   --   HGB 15.0   < > 13.0 14.3 12.3  HCT 45.5   < > 41.4 42.0 38.5  MCV 101.6*   < > 106.4*  --  104.6*  PLT 312   < > 224  --  196   < > = values in this interval not displayed.   Basic Metabolic Panel Recent Labs    88/71/74 0525  03/20/24 0954 03/20/24 1225 03/21/24 0414  NA 137   < > 138 134*  K 4.1   < > 4.3 3.6  CL 101  --   --  101  CO2 26  --   --  22  GLUCOSE 127*  --   --  103*  BUN <5*  --   --  <5*  CREATININE 0.49  --   --  0.63  CALCIUM  7.6*  --   --  7.1*  MG 2.7*  --   --  2.2  PHOS 6.6*  --   --  2.7   < > = values in this interval not displayed.   Liver Function Tests Recent Labs    03/20/24 0525 03/21/24 0414  AST 33 29  ALT 37 28  ALKPHOS 103 94  BILITOT 1.3* 1.2  PROT 6.3* 5.7*  ALBUMIN 3.0* 2.8*   Recent Labs    03/19/24 2315  LIPASE 24   Cardiac Enzymes Recent Labs    03/19/24 2315  CKTOTAL 84    BNP: BNP (last 3 results) No results for input(s): BNP in the last 8760 hours.  ProBNP (last 3 results) No results for input(s): PROBNP in the last 8760 hours.   D-Dimer  No results for input(s): DDIMER in the last 72 hours. Hemoglobin A1C Recent Labs    03/19/24 1839  HGBA1C 4.8   Fasting Lipid Panel Recent Labs    03/20/24 0525 03/20/24 1128  CHOL 189  --   HDL 38*  --   LDLCALC 121*  --   TRIG 150*  161* 201*  CHOLHDL 5.0  --    Thyroid Function Tests Recent Labs    03/19/24 1838  TSH 1.641    Other results:   Imaging    ECHOCARDIOGRAM COMPLETE Result Date: 03/20/2024    ECHOCARDIOGRAM REPORT   Patient Name:   Lindsay French Date of Exam: 03/20/2024 Medical Rec #:  [id number]   Height:       62.0 in Accession #:    [id number]  Weight:       247.4 lb Date of Birth:  08-27-1980   BSA:          2.092 m Patient Age:    43 years    BP:           87/61 mmHg Patient Gender: F           HR:           66 bpm. Exam Location:  Inpatient Procedure: 2D Echo and Intracardiac Opacification Agent (Both Spectral and Color            Flow Doppler were utilized during procedure). Indications:    Ventricular Tachycardia I47.2  History:        Patient has no prior history of Echocardiogram examinations.  Sonographer:    Charmaine Gaskins Referring Phys: [id number]  MICHAEL ANDREW TILLERY  Sonographer Comments: Echo performed with patient supine and on artificial respirator. Image acquisition challenging due to patient body habitus. IMPRESSIONS  1. Left ventricular ejection fraction, by estimation, is 55%. The left ventricle has normal function. The left ventricle has no regional wall motion abnormalities. Left ventricular diastolic parameters are indeterminate.  2. Right ventricular systolic function is moderately reduced. The right ventricular size is mildly enlarged.  3. The mitral valve is normal in structure. Trivial mitral valve regurgitation. No evidence of mitral stenosis.  4. Tricuspid valve regurgitation is mild to moderate.  5. The aortic valve is tricuspid. Aortic valve regurgitation is not visualized. No aortic stenosis is present. Comparison(s): No prior Echocardiogram. Conclusion(s)/Recommendation(s): Normal LV systolic function with moderately reduced RV systolic function. No significant valvular disease. FINDINGS  Left Ventricle: Left ventricular ejection fraction, by estimation, is 55%. The left ventricle has normal function. The left ventricle has no regional wall motion abnormalities. Definity contrast agent was given IV to delineate the left ventricular endocardial borders. The left ventricular internal cavity size was normal in size. There is no left ventricular hypertrophy. Left ventricular diastolic parameters are indeterminate. Right Ventricle: The right ventricular size is mildly enlarged. No increase in right ventricular wall thickness. Right ventricular systolic function is moderately reduced. Left Atrium: Left atrial size was normal in size. Right Atrium: Right atrial size was normal in size. Pericardium: There is no evidence of pericardial effusion. Mitral Valve: The mitral valve is normal in structure. Trivial mitral valve regurgitation. No evidence of mitral valve stenosis. Tricuspid Valve: The tricuspid valve is normal in structure. Tricuspid  valve regurgitation is mild to moderate. No evidence of tricuspid stenosis. Aortic Valve: The aortic valve is tricuspid. Aortic valve regurgitation is not visualized. No aortic stenosis is present. Aortic valve mean gradient measures 4.0 mmHg. Aortic valve peak gradient measures 6.2 mmHg.  Aortic valve area, by VTI measures 2.37 cm. Pulmonic Valve: The pulmonic valve was normal in structure. Pulmonic valve regurgitation is not visualized. No evidence of pulmonic stenosis. Aorta: The aortic root and ascending aorta are structurally normal, with no evidence of dilitation. Venous: IVC assessment for right atrial pressure unable to be performed due to mechanical ventilation. IAS/Shunts: No atrial level shunt detected by color flow Doppler.  LEFT VENTRICLE PLAX 2D LVIDd:         4.70 cm   Diastology LVIDs:         3.20 cm   LV e' medial:    8.05 cm/s LV PW:         1.00 cm   LV E/e' medial:  8.8 LV IVS:        0.90 cm   LV e' lateral:   6.09 cm/s LVOT diam:     1.94 cm   LV E/e' lateral: 11.6 LV SV:         58 LV SV Index:   28 LVOT Area:     2.96 cm  RIGHT VENTRICLE RV Basal diam:  3.42 cm RV Mid diam:    3.79 cm RV S prime:     8.81 cm/s TAPSE (M-mode): 1.3 cm LEFT ATRIUM             Index        RIGHT ATRIUM           Index LA diam:        3.13 cm 1.50 cm/m   RA Area:     13.50 cm LA Vol (A2C):   68.2 ml 32.59 ml/m  RA Volume:   31.50 ml  15.05 ml/m LA Vol (A4C):   42.3 ml 20.22 ml/m LA Biplane Vol: 56.3 ml 26.91 ml/m  AORTIC VALVE AV Area (Vmax):    2.50 cm AV Area (Vmean):   2.22 cm AV Area (VTI):     2.37 cm AV Vmax:           124.00 cm/s AV Vmean:          97.900 cm/s AV VTI:            0.244 m AV Peak Grad:      6.2 mmHg AV Mean Grad:      4.0 mmHg LVOT Vmax:         105.00 cm/s LVOT Vmean:        73.600 cm/s LVOT VTI:          0.196 m LVOT/AV VTI ratio: 0.80  AORTA Ao Root diam: 2.69 cm Ao Asc diam:  3.06 cm MITRAL VALVE               TRICUSPID VALVE MV Area (PHT): 3.19 cm    TR Peak grad:   20.1 mmHg  MV Decel Time: 238 msec    TR Vmax:        224.00 cm/s MV E velocity: 70.70 cm/s MV A velocity: 30.90 cm/s  SHUNTS MV E/A ratio:  2.29        Systemic VTI:  0.20 m                            Systemic Diam: 1.94 cm Georganna Archer Electronically signed by Georganna Archer Signature Date/Time: 03/20/2024/1:32:10 PM    Final      Medications:     Scheduled Medications:  arformoterol  15 mcg Nebulization BID  aspirin   81 mg Per Tube Daily   atorvastatin   80 mg Per Tube Daily   budesonide  (PULMICORT ) nebulizer solution  0.5 mg Nebulization BID   Chlorhexidine  Gluconate Cloth  6 each Topical Daily   docusate  100 mg Per Tube BID   dolutegravir -lamiVUDine   1 tablet Oral QPM   insulin  aspart  0-9 Units Subcutaneous Q4H   lidocaine   100 mg Intravenous Once   mupirocin  ointment  1 Application Nasal BID   mouth rinse  15 mL Mouth Rinse Q2H   pantoprazole  (PROTONIX ) IV  40 mg Intravenous Q24H   polyethylene glycol  17 g Per Tube Daily   sodium chloride  flush  10-40 mL Intracatheter Q12H   sodium chloride  flush  3 mL Intravenous Q12H    Infusions:  dexmedetomidine  (PRECEDEX ) IV infusion 0.4 mcg/kg/hr (03/21/24 0800)   fentaNYL  infusion INTRAVENOUS 125 mcg/hr (03/21/24 0800)   lidocaine  1 mg/min (03/21/24 0800)   propofol  (DIPRIVAN ) infusion 20 mcg/kg/min (03/21/24 0800)    PRN Medications: acetaminophen , ALPRAZolam , fentaNYL , mouth rinse, sodium chloride  flush, sodium chloride  flush    Patient Profile   Lindsay French is a 43 y.o. female with morbid obesity, nocturnal hypoxia, DMII, HTN, dyslipidemia, HIV, GERD, depression/anxiety and prolonged QT (9/25 admitted with low K/Mg 2/2 ozempic ). Admitted N/V/D> polymorphic VT/Torsades.   Assessment/Plan   Polymorphic VT in setting of QT prolongation  - Insetting of polypharmacy - L/RHC with normal coronaries, mild PAH, EF 65-70%, PCWP 19, LVEDP 16. Fick CO/CI 9.9/4.66, TD CO/CI 8/3.77.  - EP following - On lidocaine  wil continue until stable  off vent. Check level - QTc 514 on last EKG. QTs reading 545 on tele this am. Will repeat ECG - Intubated and sedated to help with suppression. Wean vent today. Pull TVP - Review meds closely  ST elevation  - LHC with normal coronaries - HsTrop 4>26. Suspect demand ischemia from arrhythmia.  - Continue ASA/statin  3. Acute hypoxic respiratory failure due to #1 - wean to extubation today   CRITICAL CARE Performed by: Cherrie Sieving  Total critical care time: 38 minutes  Critical care time was exclusive of separately billable procedures and treating other patients.  Critical care was necessary to treat or prevent imminent or life-threatening deterioration.  Critical care was time spent personally by me (independent of midlevel providers or residents) on the following activities: development of treatment plan with patient and/or surrogate as well as nursing, discussions with consultants, evaluation of patient's response to treatment, examination of patient, obtaining history from patient or surrogate, ordering and performing treatments and interventions, ordering and review of laboratory studies, ordering and review of radiographic studies, pulse oximetry and re-evaluation of patient's condition.  Sieving Cherrie, MD  8:59 AM

## 2024-03-21 NOTE — Progress Notes (Signed)
   NAME:  Tori Dattilio, MRN:  [id number], DOB:  [birth year], LOS: 2 ADMISSION DATE:  03/19/2024, CONSULTATION DATE:  03/19/2024 REFERRING MD: Cindie BROCKS, MD, CHIEF COMPLAINT: N/V/D since am   History of Present Illness:  A 43 yr old female patient with morbid obesity, nocturnal hypoxia on HOT (?OSA), DM-2, HTN (did not refill ARB), dyslipidemia, hypomagnesemia, HIV, GERD, depression/anxiety, and prolonged QT interval due to hypok and hypoMg (admitted in Sep 2025 due to Ozempic  related intractable nausea and vomiting), who presented to ED with N/V/D since today am, every one hour, yellow in color, without melena, blood per rectum, hematemesis, or coffee ground vomitus. She has no abd pain, abd distension, f/c/r, rash, SOB, cough, wheezing, CP, or LL edema. In ED, she has seizures and syncope. Tele showed Torsades de pointes. She was started on Lidocaine  drip due to prolonged Qtc (514 msec). Given 1 L NS 0.9%, Kcl, and Mg IV.  Denied smoking, drugs, and alcohol drinking. On Mounjaro  for DM-2. Took Compazine  x2 for nausea today. She is on fluconazole  for yeast infection prevention and Augmentin for ear infection started one week ago. She is on Prozac  for depression/anxiety. Highlighted meds can prolong the Qtc.    Pertinent  Medical History  Others dx: Idiopathic thrombocytopenic purpura (ITP) (HCC), hepatic steatosis, asthma, seizures (in her 81s due to drugs)  Significant Hospital Events: Including procedures, antibiotic start and stop dates in addition to other pertinent events   03/19/2024, ED: Lidocaine  drip due to prolonged Qtc (514 msec). Given 1 L NS 0.9%, Kcl, and Mg IV. Seen by EP Admit to 2H under PCCM  Interim History / Subjective:  No events, sedated on vent but not happy with AC  Objective    Blood pressure (!) 132/55, pulse 69, temperature 99.7 F (37.6 C), resp. rate 18, weight 111.5 kg, SpO2 97%.    Vent Mode: PRVC FiO2 (%):  [40 %] 40 % Set Rate:  [18 bmp] 18 bmp Vt Set:  [400  mL-500 mL] 500 mL PEEP:  [8 cmH20] 8 cmH20 Plateau Pressure:  [19 cmH20-21 cmH20] 21 cmH20   Intake/Output Summary (Last 24 hours) at 03/21/2024 0719 Last data filed at 03/21/2024 0700 Gross per 24 hour  Intake 1563.36 ml  Output 780 ml  Net 783.36 ml   Filed Weights   03/19/24 2338 03/20/24 0500 03/21/24 0428  Weight: 112.2 kg 112.2 kg 111.5 kg    Examination: Awake on vent Moving ext RASS 0 High driving pressures likely related to body habitus Minimal edema  Resolved problem list   Assessment and Plan  Polypharmacy induced torsades- neg coronaries Need for mechanical ventilation to reduce sympathetic drive Class 3 obesity Bipolar HIV GERD HTN Hypothyroidism PCOS Depression Reactive Airway disease NASH  Wean to extubate PT/OT Continue lido, levels pending Will need a careful med rec prior to discharge  32 min cc time Rolan Sharps MD PCCM

## 2024-03-21 NOTE — Progress Notes (Signed)
 [facility] ADULT ICU REPLACEMENT PROTOCOL   The patient does apply for the [facility] Adult ICU Electrolyte Replacment Protocol based on the criteria listed below:   1.Exclusion criteria: TCTS, ECMO, Dialysis, and Myasthenia Gravis patients 2. Is GFR >/= 30 ml/min? Yes.    Patient's GFR today is >60 3. Is SCr </= 2? Yes.   Patient's SCr is 0.63 mg/dL 4. Did SCr increase >/= 0.5 in 24 hours? No. 5.Pt's weight >40kg  Yes.   6. Abnormal electrolyte(s): Potassium  7. Electrolytes replaced per protocol 8.  Call MD STAT for K+ </= 2.5, Phos </= 1, or Mag </= 1 Physician:  Dr. Kassie Medico A Jaivon Vanbeek 03/21/2024 5:40 AM

## 2024-03-21 NOTE — Progress Notes (Signed)
  Progress Note  Patient Name: Lindsay French Date of Encounter: 03/21/2024 [facility] Cardiologist: None   Interval Summary   Extubated. No recurrence of PMVT/VF. Wants to eat/drink.  Vital Signs Vitals:   03/21/24 0730 03/21/24 0800 03/21/24 0830 03/21/24 0948  BP: 124/74 125/82 127/61   Pulse: 71 72 67   Resp: 17 17 14    Temp: 99.9 F (37.7 C) 100 F (37.8 C) 100 F (37.8 C)   TempSrc:      SpO2: 97% 99% 99% 96%  Weight:        Intake/Output Summary (Last 24 hours) at 03/21/2024 1118 Last data filed at 03/21/2024 1004 Gross per 24 hour  Intake 1463.13 ml  Output 1005 ml  Net 458.13 ml      03/21/2024    4:28 AM 03/20/2024    5:00 AM 03/19/2024   11:38 PM  Last 3 Weights  Weight (lbs) 245 lb 13 oz 247 lb 5.7 oz 247 lb 5.7 oz  Weight (kg) 111.5 kg 112.2 kg 112.2 kg      Telemetry/ECG  SR, no VAs or PVCs - Personally Reviewed  Physical Exam  General: Well developed, in no acute distress.  Neck: No JVD.  Cardiac: Normal rate, regular rhythm.  Resp: Normal work of breathing.  Ext: No edema.  Neuro: No gross focal deficits.  Psych: Normal affect.   Echo 11/28: 1. Left ventricular ejection fraction, by estimation, is 55%. The left  ventricle has normal function. The left ventricle has no regional wall  motion abnormalities. Left ventricular diastolic parameters are  indeterminate.   2. Right ventricular systolic function is moderately reduced. The right  ventricular size is mildly enlarged.   3. The mitral valve is normal in structure. Trivial mitral valve  regurgitation. No evidence of mitral stenosis.   4. Tricuspid valve regurgitation is mild to moderate.   5. The aortic valve is tricuspid. Aortic valve regurgitation is not  visualized. No aortic stenosis is present.   LHC/RHC 11/27: FINDINGS  Incessant Recurrent Polymorphic Ventricular Tachycardia/Torsades upon required defibrillation x 1 otherwise with self terminating Angiographically  Normal Coronary Arteries Mild Pulmonary Hypertension with mean PAP 29 mmHg and a PCWP of 19 mm/LVEDP of 16 mmHg. Hyperdynamic left ventricular function EF roughly 65 to 70%, Cardiac Output and Index also elevated. Fick 9.9-4.66; thermodilution 8.01-3.77 with AF sat 100% PA sat of 86% on ventilator     Assessment & Plan  #Torsades #Polymorphic VT #Syncope #Seizures - Suspect secondary to prolonged QTc from polypharmacy. Avoid all QT prolonging medications. QT shortening on ECGs with holding. LHC with normal coronaries. Updated echo with preserved LVEF. -Keep K greater than 4 mag greater than 2 -Stop lidocaine . Leave in ICU to monitor off lidocaine  for 24 hours.   #HIV #Alcohol abuse history #Anxiety Management per primary   Signed, Fonda Kitty, MD

## 2024-03-21 NOTE — Procedures (Signed)
 Extubation Procedure Note  Patient Details:   Name: Lindsay French DOB: 10-18-1980 MRN: [id number]   Airway Documentation:    Vent end date: 03/21/24 Vent end time: 0945   Evaluation  O2 sats: stable throughout Complications: No apparent complications Patient did tolerate procedure well. Bilateral Breath Sounds: Diminished   Yes, pt able to cough to clear secretions and vocalize name. Pt had cuff leak prior to extubation and was placed on 3L humidified nasal cannula and is tolerating well at this time.  Perry Eva BRAVO 03/21/2024, 9:56 AM

## 2024-03-22 ENCOUNTER — Encounter (HOSPITAL_COMMUNITY): Payer: Self-pay | Admitting: Pulmonary Disease

## 2024-03-22 DIAGNOSIS — I4721 Torsades de pointes: Secondary | ICD-10-CM | POA: Diagnosis not present

## 2024-03-22 LAB — COMPREHENSIVE METABOLIC PANEL WITH GFR
ALT: 27 U/L (ref 0–44)
AST: 32 U/L (ref 15–41)
Albumin: 2.9 g/dL — ABNORMAL LOW (ref 3.5–5.0)
Alkaline Phosphatase: 96 U/L (ref 38–126)
Anion gap: 11 (ref 5–15)
BUN: <5 mg/dL — ABNORMAL LOW (ref 6–20)
CO2: 25 mmol/L (ref 22–32)
Calcium: 7.4 mg/dL — ABNORMAL LOW (ref 8.9–10.3)
Chloride: 97 mmol/L — ABNORMAL LOW (ref 98–111)
Creatinine, Ser: 0.53 mg/dL (ref 0.44–1.00)
GFR, Estimated: >60 mL/min (ref >60)
Glucose, Bld: 102 mg/dL — ABNORMAL HIGH (ref 70–99)
Potassium: 4.1 mmol/L (ref 3.5–5.1)
Sodium: 133 mmol/L — ABNORMAL LOW (ref 135–145)
Total Bilirubin: 2.1 mg/dL — ABNORMAL HIGH (ref 0.0–1.2)
Total Protein: 6 g/dL — ABNORMAL LOW (ref 6.5–8.1)

## 2024-03-22 LAB — GLUCOSE, CAPILLARY
Glucose-Capillary: 110 mg/dL — ABNORMAL HIGH (ref 70–99)
Glucose-Capillary: 92 mg/dL (ref 70–99)
Glucose-Capillary: 112 mg/dL — ABNORMAL HIGH (ref 70–99)
Glucose-Capillary: 104 mg/dL — ABNORMAL HIGH (ref 70–99)
Glucose-Capillary: 111 mg/dL — ABNORMAL HIGH (ref 70–99)
Glucose-Capillary: 121 mg/dL — ABNORMAL HIGH (ref 70–99)

## 2024-03-22 LAB — MAGNESIUM: Magnesium: 2.1 mg/dL (ref 1.7–2.4)

## 2024-03-22 LAB — PHOSPHORUS: Phosphorus: 2.9 mg/dL (ref 2.5–4.6)

## 2024-03-22 MED ORDER — CARBAMIDE PEROXIDE 6.5 % OT SOLN
5.0000 [drp] | Freq: Two times a day (BID) | OTIC | Status: DC
  Administered 2024-03-22 (×2): 5 [drp] via OTIC
  Filled 2024-03-22 (×2): qty 15

## 2024-03-22 MED ORDER — SERTRALINE HCL 50 MG PO TABS
50.0000 mg | ORAL_TABLET | Freq: Every day | ORAL | Status: DC
  Administered 2024-03-22: 50 mg via ORAL
  Filled 2024-03-22 (×2): qty 1

## 2024-03-22 MED ORDER — PHENOL 1.4 % MT LIQD
1.0000 | OROMUCOSAL | Status: AC | PRN
  Administered 2024-03-22 – 2024-03-23 (×2): 1 via OROMUCOSAL
  Filled 2024-03-22: qty 177

## 2024-03-22 MED ORDER — ORAL CARE MOUTH RINSE: 15.0000 mL | OROMUCOSAL | Status: DC | PRN

## 2024-03-22 MED ORDER — MAGIC MOUTHWASH W/LIDOCAINE
5.0000 mL | Freq: Four times a day (QID) | ORAL | Status: DC
  Administered 2024-03-22 – 2024-03-23 (×5): 5 mL via ORAL
  Filled 2024-03-22 (×9): qty 5

## 2024-03-22 MED ORDER — NEOMYCIN-POLYMYXIN-HC 1 % OT SOLN
3.0000 [drp] | Freq: Four times a day (QID) | OTIC | Status: DC
  Administered 2024-03-22 – 2024-03-23 (×5): 3 [drp] via OTIC
  Filled 2024-03-22: qty 10

## 2024-03-22 NOTE — Plan of Care (Signed)
  Problem: Education: Goal: Knowledge of General Education information will improve Description: Including pain rating scale, medication(s)/side effects and non-pharmacologic comfort measures Outcome: Progressing   Problem: Health Behavior/Discharge Planning: Goal: Ability to manage health-related needs will improve Outcome: Progressing   Problem: Clinical Measurements: Goal: Ability to maintain clinical measurements within normal limits will improve Outcome: Progressing Goal: Will remain free from infection Outcome: Progressing Goal: Diagnostic test results will improve Outcome: Progressing Goal: Respiratory complications will improve Outcome: Progressing Goal: Cardiovascular complication will be avoided Outcome: Progressing   Problem: Activity: Goal: Risk for activity intolerance will decrease Outcome: Progressing   Problem: Nutrition: Goal: Adequate nutrition will be maintained Outcome: Progressing   Problem: Coping: Goal: Level of anxiety will decrease Outcome: Progressing   Problem: Elimination: Goal: Will not experience complications related to bowel motility Outcome: Progressing Goal: Will not experience complications related to urinary retention Outcome: Progressing   Problem: Pain Managment: Goal: General experience of comfort will improve and/or be controlled Outcome: Progressing   Problem: Safety: Goal: Ability to remain free from injury will improve Outcome: Progressing   Problem: Skin Integrity: Goal: Risk for impaired skin integrity will decrease Outcome: Progressing   Problem: Activity: Goal: Ability to tolerate increased activity will improve Outcome: Progressing   Problem: Respiratory: Goal: Ability to maintain a clear airway and adequate ventilation will improve Outcome: Progressing   Problem: Role Relationship: Goal: Method of communication will improve Outcome: Progressing   Problem: Education: Goal: Understanding of CV disease, CV  risk reduction, and recovery process will improve Outcome: Progressing Goal: Individualized Educational Video(s) Outcome: Progressing   Problem: Activity: Goal: Ability to return to baseline activity level will improve Outcome: Progressing   Problem: Cardiovascular: Goal: Ability to achieve and maintain adequate cardiovascular perfusion will improve Outcome: Progressing Goal: Vascular access site(s) Level 0-1 will be maintained Outcome: Progressing   Problem: Health Behavior/Discharge Planning: Goal: Ability to safely manage health-related needs after discharge will improve Outcome: Progressing

## 2024-03-22 NOTE — Progress Notes (Signed)
  Progress Note  Patient Name: Lindsay French Date of Encounter: 03/22/2024 [facility] Cardiologist: None   Interval Summary   No acute overnight events. Patient reports feeling relatively well. No new or acute complaints.   Vital Signs Vitals:   03/22/24 0615 03/22/24 0630 03/22/24 0700 03/22/24 0742  BP:   118/82   Pulse: 97 87 95   Resp: 13 14 16    Temp: 99.3 F (37.4 C)     TempSrc:      SpO2: 98% 97% 98% 97%  Weight:        Intake/Output Summary (Last 24 hours) at 03/22/2024 0934 Last data filed at 03/22/2024 0615 Gross per 24 hour  Intake 123.43 ml  Output 780 ml  Net -656.57 ml      03/22/2024    5:00 AM 03/21/2024    4:28 AM 03/20/2024    5:00 AM  Last 3 Weights  Weight (lbs) 245 lb 6 oz 245 lb 13 oz 247 lb 5.7 oz  Weight (kg) 111.3 kg 111.5 kg 112.2 kg      Telemetry/ECG  SR, no ventricular arrhythmias or significant PVC burden - Personally Reviewed  Physical Exam  General: Well developed, in no acute distress.  Neck: No JVD.  Cardiac: Normal rate, regular rhythm.  Resp: Normal work of breathing.  Ext: No edema.  Neuro: No gross focal deficits.  Psych: Normal affect.   Assessment & Plan  #Torsades #Polymorphic VT #Syncope #Seizures - Suspect secondary to prolonged QTc from polypharmacy. Avoid all QT prolonging medications. QT shortening on ECGs with holding. LHC with normal coronaries. Updated echo with preserved LVEF. -Keep K greater than 4 mag greater than 2 -No recurrence of ventricular arrhythmias or significant PVC burden off lidocaine .   #HIV #Alcohol abuse history #Anxiety Management per primary  EP will sign off.   Signed, Fonda Kitty, MD

## 2024-03-22 NOTE — Progress Notes (Signed)
 Advanced Heart Failure Rounding Note  Cardiologist: None  Chief Complaint: Polymorphic VT/Torsades Subjective:   Admitted with polymorphic VT/Torsades suspect in the setting of polypharmacy (compazine , prozac , fluconazole  and lithium ).   LHC with normal coronaries.   Extubated. Lido stopped yesterday by EP. No further VT  QTc 489 on ECG   Objective:   Weight Range: 111.3 kg Body mass index is 44.88 kg/m.   Vital Signs:   Temp:  [98.2 F (36.8 C)-100 F (37.8 C)] 99.3 F (37.4 C) (11/30 0615) Pulse Rate:  [73-109] 95 (11/30 0700) Resp:  [9-21] 16 (11/30 0700) BP: (112-162)/(57-105) 118/82 (11/30 0700) SpO2:  [85 %-99 %] 97 % (11/30 0742) Arterial Line BP: (117-138)/(70-129) 138/85 (11/29 1130) Weight:  [111.3 kg] 111.3 kg (11/30 0500) Last BM Date : 03/21/24  Weight change: Filed Weights   03/20/24 0500 03/21/24 0428 03/22/24 0500  Weight: 112.2 kg 111.5 kg 111.3 kg    Intake/Output:   Intake/Output Summary (Last 24 hours) at 03/22/2024 1010 Last data filed at 03/22/2024 0615 Gross per 24 hour  Intake 123.43 ml  Output 705 ml  Net -581.57 ml    Physical Exam   General:  Sitting up in bed. No resp difficulty HEENT: normal Neck: supple. no JVD.  Cor: Regular rate & rhythm. No rubs, gallops or murmurs. Lungs: clear Abdomen: obese soft, nontender, nondistended.Good bowel sounds. Extremities: no cyanosis, clubbing, rash, edema Neuro: alert & orientedx3, cranial nerves grossly intact. moves all 4 extremities w/o difficulty. Affect pleasant   Telemetry   NSR 90s Personally reviewed  Labs    CBC Recent Labs    03/19/24 1839 03/19/24 2128 03/20/24 1128 03/20/24 1225 03/21/24 0414  WBC 11.7*   < > 10.3  --  8.9  NEUTROABS 9.6*  --   --   --   --   HGB 15.0   < > 13.0 14.3 12.3  HCT 45.5   < > 41.4 42.0 38.5  MCV 101.6*   < > 106.4*  --  104.6*  PLT 312   < > 224  --  196   < > = values in this interval not displayed.   Basic Metabolic  Panel Recent Labs    03/21/24 0414 03/22/24 0211  NA 134* 133*  K 3.6 4.1  CL 101 97*  CO2 22 25  GLUCOSE 103* 102*  BUN <5* <5*  CREATININE 0.63 0.53  CALCIUM  7.1* 7.4*  MG 2.2 2.1  PHOS 2.7 2.9   Liver Function Tests Recent Labs    03/21/24 0414 03/22/24 0211  AST 29 32  ALT 28 27  ALKPHOS 94 96  BILITOT 1.2 2.1*  PROT 5.7* 6.0*  ALBUMIN 2.8* 2.9*   Recent Labs    03/19/24 2315  LIPASE 24   Cardiac Enzymes Recent Labs    03/19/24 2315  CKTOTAL 84    BNP: BNP (last 3 results) No results for input(s): BNP in the last 8760 hours.  ProBNP (last 3 results) No results for input(s): PROBNP in the last 8760 hours.   D-Dimer No results for input(s): DDIMER in the last 72 hours. Hemoglobin A1C Recent Labs    03/19/24 1839  HGBA1C 4.8   Fasting Lipid Panel Recent Labs    03/20/24 0525 03/20/24 1128  CHOL 189  --   HDL 38*  --   LDLCALC 121*  --   TRIG 150*  161* 201*  CHOLHDL 5.0  --    Thyroid Function Tests Recent Labs  03/19/24 1838  TSH 1.641    Other results:   Imaging    No results found.    Medications:     Scheduled Medications:  amLODipine  5 mg Oral Daily   arformoterol  15 mcg Nebulization BID   aspirin   81 mg Oral Daily   atorvastatin   80 mg Oral Daily   budesonide  (PULMICORT ) nebulizer solution  0.5 mg Nebulization BID   Chlorhexidine  Gluconate Cloth  6 each Topical Daily   dolutegravir -lamiVUDine   1 tablet Oral QPM   insulin  aspart  0-9 Units Subcutaneous Q4H   mupirocin  ointment  1 Application Nasal BID   nystatin   5 mL Oral QID   pantoprazole  (PROTONIX ) IV  40 mg Intravenous Q24H   sodium chloride  flush  10-40 mL Intracatheter Q12H   sodium chloride  flush  3 mL Intravenous Q12H    Infusions:    PRN Medications: acetaminophen , ALPRAZolam , hydrALAZINE , HYDROmorphone  (DILAUDID ) injection, mouth rinse, phenol, sodium chloride  flush, sodium chloride  flush    Patient Profile   Lindsay French is a  43 y.o. female with morbid obesity, nocturnal hypoxia, DMII, HTN, dyslipidemia, HIV, GERD, depression/anxiety and prolonged QT (9/25 admitted with low K/Mg 2/2 ozempic ). Admitted N/V/D> polymorphic VT/Torsades.   Assessment/Plan   Polymorphic VT in setting of QT prolongation  - Insetting of polypharmacy - L/RHC with normal coronaries, mild PAH, EF 65-70%, PCWP 19, LVEDP 16. Fick CO/CI 9.9/4.66, TD CO/CI 8/3.77.  - EP following - Lido off - QTc 489 on ECG - Avoid all Qtc prolonging drugs  ST elevation  - LHC with normal coronaries - HsTrop 4>26. Suspect demand ischemia from arrhythmia.  - Continue ASA/statin  3. Acute hypoxic respiratory failure due to #1 - resolved   Cardiology will sign off. Pharmacy and primary team working on reviewing all her meds.  Toribio Fuel, MD  10:10 AM

## 2024-03-22 NOTE — Plan of Care (Signed)
  Problem: Education: Goal: Knowledge of General Education information will improve Description: Including pain rating scale, medication(s)/side effects and non-pharmacologic comfort measures Outcome: Progressing   Problem: Health Behavior/Discharge Planning: Goal: Ability to manage health-related needs will improve Outcome: Progressing   Problem: Clinical Measurements: Goal: Ability to maintain clinical measurements within normal limits will improve Outcome: Progressing Goal: Will remain free from infection Outcome: Progressing Goal: Diagnostic test results will improve Outcome: Progressing Goal: Respiratory complications will improve Outcome: Progressing Goal: Cardiovascular complication will be avoided Outcome: Progressing   Problem: Activity: Goal: Risk for activity intolerance will decrease Outcome: Progressing   Problem: Nutrition: Goal: Adequate nutrition will be maintained Outcome: Progressing   Problem: Coping: Goal: Level of anxiety will decrease Outcome: Not Progressing   Problem: Elimination: Goal: Will not experience complications related to bowel motility Outcome: Progressing Goal: Will not experience complications related to urinary retention Outcome: Progressing   Problem: Pain Managment: Goal: General experience of comfort will improve and/or be controlled Outcome: Progressing   Problem: Safety: Goal: Ability to remain free from injury will improve Outcome: Progressing   Problem: Skin Integrity: Goal: Risk for impaired skin integrity will decrease Outcome: Progressing   Problem: Activity: Goal: Ability to tolerate increased activity will improve Outcome: Progressing   Problem: Respiratory: Goal: Ability to maintain a clear airway and adequate ventilation will improve Outcome: Progressing   Problem: Role Relationship: Goal: Method of communication will improve Outcome: Progressing   Problem: Education: Goal: Understanding of CV disease,  CV risk reduction, and recovery process will improve Outcome: Progressing Goal: Individualized Educational Video(s) Outcome: Progressing   Problem: Activity: Goal: Ability to return to baseline activity level will improve Outcome: Progressing   Problem: Cardiovascular: Goal: Ability to achieve and maintain adequate cardiovascular perfusion will improve Outcome: Progressing Goal: Vascular access site(s) Level 0-1 will be maintained Outcome: Progressing   Problem: Health Behavior/Discharge Planning: Goal: Ability to safely manage health-related needs after discharge will improve Outcome: Progressing

## 2024-03-22 NOTE — Progress Notes (Signed)
   NAME:  Lindsay French, MRN:  [id number], DOB:  1980-05-12, LOS: 3 ADMISSION DATE:  03/19/2024, CONSULTATION DATE:  03/19/2024 REFERRING MD: Cindie BROCKS, MD, CHIEF COMPLAINT: N/V/D since am   History of Present Illness:  A 43 yr old female patient with morbid obesity, nocturnal hypoxia on HOT (?OSA), DM-2, HTN (did not refill ARB), dyslipidemia, hypomagnesemia, HIV, GERD, depression/anxiety, and prolonged QT interval due to hypok and hypoMg (admitted in Sep 2025 due to Ozempic  related intractable nausea and vomiting), who presented to ED with N/V/D since today am, every one hour, yellow in color, without melena, blood per rectum, hematemesis, or coffee ground vomitus. She has no abd pain, abd distension, f/c/r, rash, SOB, cough, wheezing, CP, or LL edema. In ED, she has seizures and syncope. Tele showed Torsades de pointes. She was started on Lidocaine  drip due to prolonged Qtc (514 msec). Given 1 L NS 0.9%, Kcl, and Mg IV.  Denied smoking, drugs, and alcohol drinking. On Mounjaro  for DM-2. Took Compazine  x2 for nausea today. She is on fluconazole  for yeast infection prevention and Augmentin for ear infection started one week ago. She is on Prozac  for depression/anxiety. Highlighted meds can prolong the Qtc.    Pertinent  Medical History  Others dx: Idiopathic thrombocytopenic purpura (ITP) (HCC), hepatic steatosis, asthma, seizures (in her 71s due to drugs)  Significant Hospital Events: Including procedures, antibiotic start and stop dates in addition to other pertinent events   03/19/2024, ED: Lidocaine  drip due to prolonged Qtc (514 msec). Given 1 L NS 0.9%, Kcl, and Mg IV. Seen by EP Admit to 2H under PCCM  Interim History / Subjective:  No events, tolerated extubation.  Objective    Blood pressure 118/82, pulse 95, temperature 99.3 F (37.4 C), resp. rate 16, weight 111.3 kg, SpO2 97%.        Intake/Output Summary (Last 24 hours) at 03/22/2024 1014 Last data filed at 03/22/2024  0615 Gross per 24 hour  Intake 123.43 ml  Output 705 ml  Net -581.57 ml   Filed Weights   03/20/24 0500 03/21/24 0428 03/22/24 0500  Weight: 112.2 kg 111.5 kg 111.3 kg    Examination: No distress Has leukoplakia (doesn't scrape off), posterior pharynx just looks irritated R ear clear L ear full of debris and TTP: will tx as otitis externa Lungs clear Heart sounds regular Groin some bruising, hematoma resolved Moves to command  Resolved problem list   Assessment and Plan  Polypharmacy induced torsades- neg coronaries Need for mechanical ventilation to reduce sympathetic drive Class 3 obesity Bipolar HIV GERD HTN Hypothyroidism- TSH ok PCOS Depression Reactive Airway disease NASH  - PT/OT eval - Magic mouthwash for a couple days I guess but don't think its thrush - Debrox + otic abx/ hydrocortisone to left ear - Starting at bedtime sertraline; can add atarax  PRN tomorrow if QT remains okay - No more lithium , no more fluconazole  - Continue ART  Stable to leave ICU, appreciate TRH taking over starting 03/23/24  Rolan Sharps MD PCCM

## 2024-03-23 ENCOUNTER — Other Ambulatory Visit (HOSPITAL_COMMUNITY): Payer: Self-pay

## 2024-03-23 DIAGNOSIS — Z8249 Family history of ischemic heart disease and other diseases of the circulatory system: Secondary | ICD-10-CM

## 2024-03-23 DIAGNOSIS — I4721 Torsades de pointes: Secondary | ICD-10-CM | POA: Diagnosis not present

## 2024-03-23 DIAGNOSIS — Z87891 Personal history of nicotine dependence: Secondary | ICD-10-CM

## 2024-03-23 DIAGNOSIS — F32A Depression, unspecified: Secondary | ICD-10-CM

## 2024-03-23 DIAGNOSIS — E1169 Type 2 diabetes mellitus with other specified complication: Secondary | ICD-10-CM

## 2024-03-23 DIAGNOSIS — B20 Human immunodeficiency virus [HIV] disease: Secondary | ICD-10-CM

## 2024-03-23 DIAGNOSIS — E119 Type 2 diabetes mellitus without complications: Secondary | ICD-10-CM

## 2024-03-23 DIAGNOSIS — E441 Mild protein-calorie malnutrition: Secondary | ICD-10-CM

## 2024-03-23 DIAGNOSIS — F419 Anxiety disorder, unspecified: Secondary | ICD-10-CM

## 2024-03-23 LAB — COMPREHENSIVE METABOLIC PANEL WITH GFR
ALT: 21 U/L (ref 0–44)
AST: 28 U/L (ref 15–41)
Albumin: 2.7 g/dL — ABNORMAL LOW (ref 3.5–5.0)
Alkaline Phosphatase: 96 U/L (ref 38–126)
Anion gap: 8 (ref 5–15)
BUN: <5 mg/dL — ABNORMAL LOW (ref 6–20)
CO2: 29 mmol/L (ref 22–32)
Calcium: 7.8 mg/dL — ABNORMAL LOW (ref 8.9–10.3)
Chloride: 97 mmol/L — ABNORMAL LOW (ref 98–111)
Creatinine, Ser: 0.5 mg/dL (ref 0.44–1.00)
GFR, Estimated: >60 mL/min (ref >60)
Glucose, Bld: 85 mg/dL (ref 70–99)
Potassium: 3.4 mmol/L — ABNORMAL LOW (ref 3.5–5.1)
Sodium: 134 mmol/L — ABNORMAL LOW (ref 135–145)
Total Bilirubin: 1.3 mg/dL — ABNORMAL HIGH (ref 0.0–1.2)
Total Protein: 5.7 g/dL — ABNORMAL LOW (ref 6.5–8.1)

## 2024-03-23 LAB — GLUCOSE, CAPILLARY
Glucose-Capillary: 102 mg/dL — ABNORMAL HIGH (ref 70–99)
Glucose-Capillary: 90 mg/dL (ref 70–99)
Glucose-Capillary: 94 mg/dL (ref 70–99)
Glucose-Capillary: 99 mg/dL (ref 70–99)

## 2024-03-23 LAB — POCT I-STAT, CHEM 8
BUN: <3 mg/dL — ABNORMAL LOW (ref 6–20)
Calcium, Ion: 0.91 mmol/L — ABNORMAL LOW (ref 1.15–1.40)
Chloride: 96 mmol/L — ABNORMAL LOW (ref 98–111)
Creatinine, Ser: 0.6 mg/dL (ref 0.44–1.00)
Glucose, Bld: 131 mg/dL — ABNORMAL HIGH (ref 70–99)
HCT: 48 % — ABNORMAL HIGH (ref 36.0–46.0)
Hemoglobin: 16.3 g/dL — ABNORMAL HIGH (ref 12.0–15.0)
Potassium: 3.7 mmol/L (ref 3.5–5.1)
Sodium: 137 mmol/L (ref 135–145)
TCO2: 25 mmol/L (ref 22–32)

## 2024-03-23 LAB — LIPOPROTEIN A (LPA): Lipoprotein (a): <8.4 nmol/L (ref <75.0)

## 2024-03-23 LAB — MAGNESIUM: Magnesium: 1.9 mg/dL (ref 1.7–2.4)

## 2024-03-23 LAB — PHOSPHORUS: Phosphorus: 1.6 mg/dL — ABNORMAL LOW (ref 2.5–4.6)

## 2024-03-23 MED ORDER — POTASSIUM CHLORIDE CRYS ER 20 MEQ PO TBCR
40.0000 meq | EXTENDED_RELEASE_TABLET | ORAL | Status: AC
  Administered 2024-03-23 (×2): 40 meq via ORAL
  Filled 2024-03-23 (×2): qty 2

## 2024-03-23 MED ORDER — NEOMYCIN-POLYMYXIN-HC 1 % OT SOLN
3.0000 [drp] | Freq: Four times a day (QID) | OTIC | 0 refills | Status: DC
  Filled 2024-03-23: qty 10, 17d supply, fill #0

## 2024-03-23 MED ORDER — SERTRALINE HCL 50 MG PO TABS
50.0000 mg | ORAL_TABLET | Freq: Every day | ORAL | 0 refills | Status: DC
  Filled 2024-03-23: qty 30, 30d supply, fill #0

## 2024-03-23 MED ORDER — ASPIRIN 81 MG PO CHEW
81.0000 mg | CHEWABLE_TABLET | Freq: Every day | ORAL | 0 refills | Status: AC
  Filled 2024-03-23: qty 90, 90d supply, fill #0

## 2024-03-23 MED ORDER — AMLODIPINE BESYLATE 5 MG PO TABS
5.0000 mg | ORAL_TABLET | Freq: Every day | ORAL | 0 refills | Status: DC
  Filled 2024-03-23: qty 30, 30d supply, fill #0

## 2024-03-23 MED ORDER — MAGNESIUM SULFATE 2 GM/50ML IV SOLN
2.0000 g | Freq: Once | INTRAVENOUS | Status: AC
  Administered 2024-03-23: 2 g via INTRAVENOUS
  Filled 2024-03-23: qty 50

## 2024-03-23 MED ORDER — ATORVASTATIN CALCIUM 80 MG PO TABS
80.0000 mg | ORAL_TABLET | Freq: Every day | ORAL | 0 refills | Status: DC
  Filled 2024-03-23: qty 90, 90d supply, fill #0

## 2024-03-23 NOTE — Plan of Care (Signed)
  Problem: Education: Goal: Knowledge of General Education information will improve Description: Including pain rating scale, medication(s)/side effects and non-pharmacologic comfort measures Outcome: Adequate for Discharge   Problem: Health Behavior/Discharge Planning: Goal: Ability to manage health-related needs will improve Outcome: Adequate for Discharge   Problem: Clinical Measurements: Goal: Ability to maintain clinical measurements within normal limits will improve Outcome: Adequate for Discharge Goal: Will remain free from infection Outcome: Adequate for Discharge Goal: Diagnostic test results will improve Outcome: Adequate for Discharge Goal: Respiratory complications will improve Outcome: Adequate for Discharge Goal: Cardiovascular complication will be avoided Outcome: Adequate for Discharge   Problem: Activity: Goal: Risk for activity intolerance will decrease Outcome: Adequate for Discharge   Problem: Nutrition: Goal: Adequate nutrition will be maintained Outcome: Adequate for Discharge   Problem: Coping: Goal: Level of anxiety will decrease Outcome: Adequate for Discharge   Problem: Elimination: Goal: Will not experience complications related to bowel motility Outcome: Adequate for Discharge Goal: Will not experience complications related to urinary retention Outcome: Adequate for Discharge   Problem: Pain Managment: Goal: General experience of comfort will improve and/or be controlled Outcome: Adequate for Discharge   Problem: Safety: Goal: Ability to remain free from injury will improve Outcome: Adequate for Discharge   Problem: Skin Integrity: Goal: Risk for impaired skin integrity will decrease Outcome: Adequate for Discharge   Problem: Activity: Goal: Ability to tolerate increased activity will improve Outcome: Adequate for Discharge   Problem: Respiratory: Goal: Ability to maintain a clear airway and adequate ventilation will  improve Outcome: Adequate for Discharge   Problem: Role Relationship: Goal: Method of communication will improve Outcome: Adequate for Discharge   Problem: Education: Goal: Understanding of CV disease, CV risk reduction, and recovery process will improve Outcome: Adequate for Discharge Goal: Individualized Educational Video(s) Outcome: Adequate for Discharge   Problem: Activity: Goal: Ability to return to baseline activity level will improve Outcome: Adequate for Discharge   Problem: Cardiovascular: Goal: Ability to achieve and maintain adequate cardiovascular perfusion will improve Outcome: Adequate for Discharge Goal: Vascular access site(s) Level 0-1 will be maintained Outcome: Adequate for Discharge   Problem: Health Behavior/Discharge Planning: Goal: Ability to safely manage health-related needs after discharge will improve Outcome: Adequate for Discharge

## 2024-03-23 NOTE — Evaluation (Signed)
 Occupational Therapy Evaluation Patient Details Name: Lindsay French MRN: [id number] DOB: 1980/11/29 Today's Date: 03/23/2024   History of Present Illness   43 y/o F presenting to ED on 11/27 with n/v/d, transferred to ICU on lidocaine  drip due to prolonged Qtc interval. Intubated 11/27-11/30.  Admitted for polymorphic VT in setting of QT prolongation/Torsades.   PMH includes morbid obesity, nocturnal hypoxia on HOT, DM2, HTN, dyslipidemia, hypomagnesemia, HIV, GERD, depression/anxiety, prolonged QT interval due to hypo K, and hypoMg     Clinical Impressions Pt ind at baseline with ADLs and functional mobility, uses electric scooter out in community. Pt lives with spouse who cannot assist due to disabling back pain. Pt currently needs CGA-min A for ADLs, supervision for bed mobility and supervision for transfers without AD. Pt reports feeling weaker compared to baseline. Pt presenting with impairments listed below, will follow acutely. Recommend HHOT at d/c pending progression.     If plan is discharge home, recommend the following:   A little help with walking and/or transfers;A little help with bathing/dressing/bathroom;Assistance with cooking/housework;Direct supervision/assist for medications management;Direct supervision/assist for financial management;Assist for transportation     Functional Status Assessment   Patient has had a recent decline in their functional status and demonstrates the ability to make significant improvements in function in a reasonable and predictable amount of time.     Equipment Recommendations   None recommended by OT     Recommendations for Other Services   PT consult     Precautions/Restrictions   Precautions Precautions: Fall Restrictions Weight Bearing Restrictions Per Provider Order: No     Mobility Bed Mobility Overal bed mobility: Needs Assistance Bed Mobility: Sit to Supine       Sit to supine: Supervision         Transfers Overall transfer level: Needs assistance Equipment used: None Transfers: Sit to/from Stand Sit to Stand: Supervision                  Balance Overall balance assessment: Mild deficits observed, not formally tested                                         ADL either performed or assessed with clinical judgement   ADL Overall ADL's : Needs assistance/impaired Eating/Feeding: Set up   Grooming: Set up   Upper Body Bathing: Minimal assistance   Lower Body Bathing: Minimal assistance   Upper Body Dressing : Minimal assistance   Lower Body Dressing: Minimal assistance   Toilet Transfer: Minimal assistance   Toileting- Clothing Manipulation and Hygiene: Contact guard assist       Functional mobility during ADLs: Minimal assistance       Vision Baseline Vision/History: 1 Wears glasses Vision Assessment?: No apparent visual deficits     Perception Perception: Not tested       Praxis Praxis: Not tested       Pertinent Vitals/Pain Pain Assessment Pain Assessment: Faces Pain Score: 2  Faces Pain Scale: Hurts a little bit Pain Location: L foot and ankle Pain Descriptors / Indicators: Discomfort Pain Intervention(s): Limited activity within patient's tolerance, Monitored during session, Repositioned     Extremity/Trunk Assessment Upper Extremity Assessment Upper Extremity Assessment: Generalized weakness   Lower Extremity Assessment Lower Extremity Assessment: Defer to PT evaluation   Cervical / Trunk Assessment Cervical / Trunk Assessment: Normal   Communication Communication Communication: No apparent difficulties   Cognition  Arousal: Alert Behavior During Therapy: Anxious, WFL for tasks assessed/performed Cognition: No apparent impairments                               Following commands: Intact       Cueing  General Comments   Cueing Techniques: Verbal cues  vSS on RA   Exercises      Shoulder Instructions      Home Living Family/patient expects to be discharged to:: Private residence Living Arrangements: Spouse/significant other (husband) Available Help at Discharge: Family (spouse unable to assist due ot back pain) Type of Home: House Home Access: Ramped entrance     Home Layout: One level     Bathroom Shower/Tub: Chief Strategy Officer: Standard     Home Equipment: Tub Administrator (2 wheels);BSC/3in1;Other (comment) (knee scooter, bidet)          Prior Functioning/Environment Prior Level of Function : Needs assist;Driving;Working/employed             Mobility Comments: no AD in the home, electric scooter in the community ADLs Comments: ind, spouse assists with med mgmt, works part time as a home daycare    OT Problem List: Decreased strength;Decreased range of motion;Decreased activity tolerance;Impaired balance (sitting and/or standing)   OT Treatment/Interventions: Self-care/ADL training;Therapeutic exercise;Energy conservation;DME and/or AE instruction;Therapeutic activities;Patient/family education;Balance training      OT Goals(Current goals can be found in the care plan section)   Acute Rehab OT Goals Patient Stated Goal: none stated OT Goal Formulation: With patient Time For Goal Achievement: 04/06/24 Potential to Achieve Goals: Good ADL Goals Pt Will Perform Tub/Shower Transfer: ambulating;Tub transfer;Shower transfer;tub Designer, Television/film Set will Perform Home Exercise Program: Increased ROM;Increased strength;Both right and left upper extremity;With Supervision Additional ADL Goal #1: pt will tolerate OOB mobility x15 min in order to improve activity tolerance for ADLs   OT Frequency:  Min 1X/week    Co-evaluation              AM-PAC OT 6 Clicks Daily Activity     Outcome Measure Help from another person eating meals?: A Little Help from another person taking care of personal grooming?: A  Little Help from another person toileting, which includes using toliet, bedpan, or urinal?: A Little Help from another person bathing (including washing, rinsing, drying)?: A Little Help from another person to put on and taking off regular upper body clothing?: A Little Help from another person to put on and taking off regular lower body clothing?: A Little 6 Click Score: 18   End of Session Nurse Communication: Mobility status  Activity Tolerance: Patient tolerated treatment well Patient left: in bed;with call bell/phone within reach  OT Visit Diagnosis: Other abnormalities of gait and mobility (R26.89);Unsteadiness on feet (R26.81);Muscle weakness (generalized) (M62.81)                Time: 8575-8556 OT Time Calculation (min): 19 min Charges:  OT General Charges $OT Visit: 1 Visit OT Evaluation $OT Eval Low Complexity: 1 Low  Zeferino Mounts K, OTD, OTR/L SecureChat Preferred Acute Rehab [phone]   Adonus Uselman K Koonce 03/23/2024, 2:55 PM

## 2024-03-23 NOTE — Evaluation (Signed)
 Physical Therapy Evaluation Patient Details Name: Lindsay French MRN: [id number] DOB: 12/30/1980 Today's Date: 03/23/2024  History of Present Illness  43 y/o F presenting to ED on 11/27 with n/v/d, transferred to ICU on lidocaine  drip due to prolonged Qtc interval. Intubated 11/27-11/30.  Admitted for polymorphic VT in setting of QT prolongation/Torsades.   PMH includes morbid obesity, nocturnal hypoxia on HOT, DM2, HTN, dyslipidemia, hypomagnesemia, HIV, GERD, depression/anxiety, prolonged QT interval due to hypo K, and hypoMg   Clinical Impression  Prior to admittance pt was mobilizing independently for household distances, and occasionally uses an art gallery manager in the community. Pt presents to evaluation with deficits in mobility, power, balance, and activity tolerance. Pt was able to ambulate short room level distances without an AD and no physical assistance given. Pt would benefit from further gait, LE strengthening, and higher level balance training. PT will continue to treat pt while she is admitted. Recommending HHPT at discharge to address remaining mobility deficits.         If plan is discharge home, recommend the following: A little help with bathing/dressing/bathroom;Assist for transportation   Can travel by private vehicle        Equipment Recommendations None recommended by PT  Recommendations for Other Services       Functional Status Assessment Patient has had a recent decline in their functional status and demonstrates the ability to make significant improvements in function in a reasonable and predictable amount of time.     Precautions / Restrictions Precautions Precautions: Fall Recall of Precautions/Restrictions: Intact Restrictions Weight Bearing Restrictions Per Provider Order: No      Mobility  Bed Mobility Overal bed mobility: Needs Assistance Bed Mobility: Supine to Sit, Sit to Supine     Supine to sit: Supervision Sit to supine: Supervision    General bed mobility comments: increased time to complete    Transfers Overall transfer level: Needs assistance Equipment used: None Transfers: Sit to/from Stand Sit to Stand: Supervision           General transfer comment: sit to stand from EOB w/out an AD. Increased time to complete.    Ambulation/Gait Ambulation/Gait assistance: Supervision Gait Distance (Feet): 25 Feet Assistive device: None Gait Pattern/deviations: Step-through pattern, Decreased stride length Gait velocity: reduced Gait velocity interpretation: <1.8 ft/sec, indicate of risk for recurrent falls   General Gait Details: Pt demonstrates reciprocal gait pattern w/ reduced knee flexion and ankle DF bilaterally.  Stairs            Wheelchair Mobility     Tilt Bed    Modified Rankin (Stroke Patients Only)       Balance Overall balance assessment: Needs assistance Sitting-balance support: No upper extremity supported, Feet supported Sitting balance-Leahy Scale: Normal Sitting balance - Comments: seated EOB   Standing balance support: No upper extremity supported, During functional activity Standing balance-Leahy Scale: Good Standing balance comment: able to shift weight throughout gait w/out any LOB or signs of instability                             Pertinent Vitals/Pain Pain Assessment Pain Assessment: 0-10 Pain Score: 3  Faces Pain Scale: Hurts a little bit Pain Location: throat/chest Pain Descriptors / Indicators: Discomfort Pain Intervention(s): Limited activity within patient's tolerance, Monitored during session    Home Living Family/patient expects to be discharged to:: Private residence Living Arrangements: Spouse/significant other (husband) Available Help at Discharge: Family (spouse unable to assist due  to back pain; does have neighbors who could help if needed) Type of Home: House Home Access: Level entry       Home Layout: One level Home Equipment: Tub  bench;Rolling Walker (2 wheels);BSC/3in1;Other (comment);Hand held shower head (knee scooter, bidet)      Prior Function Prior Level of Function : Needs assist;Driving;Working/employed             Mobility Comments: no AD in the home, electric scooter in the community ADLs Comments: ind, spouse assists with med mgmt, works part time as a home daycare     Extremity/Trunk Assessment   Upper Extremity Assessment Upper Extremity Assessment: Defer to OT evaluation    Lower Extremity Assessment Lower Extremity Assessment: Generalized weakness (grossly 4+/5 throughout. Pt does report fhx of ELDS as well as frequently rolling her L ankle.)    Cervical / Trunk Assessment Cervical / Trunk Assessment: Kyphotic  Communication   Communication Communication: No apparent difficulties    Cognition Arousal: Alert Behavior During Therapy: Anxious, WFL for tasks assessed/performed   PT - Cognitive impairments: No apparent impairments                       PT - Cognition Comments: Pt reports she has significant social anxiety which sometimes limits physical function. Following commands: Intact       Cueing Cueing Techniques: Verbal cues     General Comments General comments (skin integrity, edema, etc.): BP 100/59; HR: 84 bpm    Exercises     Assessment/Plan    PT Assessment Patient needs continued PT services  PT Problem List Decreased activity tolerance;Decreased balance;Decreased mobility;Pain       PT Treatment Interventions Gait training;Stair training;Functional mobility training;Therapeutic activities;Therapeutic exercise;Balance training;Wheelchair mobility training;Patient/family education    PT Goals (Current goals can be found in the Care Plan section)  Acute Rehab PT Goals Patient Stated Goal: to go home PT Goal Formulation: With patient Time For Goal Achievement: 04/06/24 Potential to Achieve Goals: Good    Frequency Min 2X/week      Co-evaluation               AM-PAC PT 6 Clicks Mobility  Outcome Measure Help needed turning from your back to your side while in a flat bed without using bedrails?: None Help needed moving from lying on your back to sitting on the side of a flat bed without using bedrails?: A Little Help needed moving to and from a bed to a chair (including a wheelchair)?: A Little Help needed standing up from a chair using your arms (e.g., wheelchair or bedside chair)?: A Little Help needed to walk in hospital room?: A Little Help needed climbing 3-5 steps with a railing? : Total 6 Click Score: 17    End of Session   Activity Tolerance: Patient tolerated treatment well Patient left: in bed;with call bell/phone within reach Nurse Communication: Mobility status PT Visit Diagnosis: Other abnormalities of gait and mobility (R26.89);Muscle weakness (generalized) (M62.81)    Time: 8557-8494 PT Time Calculation (min) (ACUTE ONLY): 23 min   Charges:   PT Evaluation $PT Eval Low Complexity: 1 Low   PT General Charges $$ ACUTE PT VISIT: 1 Visit         Leontine Hilt DPT Acute Rehab Services [phone] Prefer contact via chat   Leontine NOVAK Yahir Tavano 03/23/2024, 3:24 PM

## 2024-03-23 NOTE — Plan of Care (Signed)
   Problem: Education: Goal: Knowledge of General Education information will improve Description Including pain rating scale, medication(s)/side effects and non-pharmacologic comfort measures Outcome: Progressing

## 2024-03-23 NOTE — Discharge Summary (Addendum)
 Physician Discharge Summary  Lindsay French FMW:[id number] DOB: Jun 21, 1980 DOA: 03/19/2024  PCP: Lindsay Reyes BROCKS, MD  Admit date: 03/19/2024 Discharge date: 03/23/2024  Time spent: 40 minutes  Recommendations for Outpatient Follow-up:  Follow outpatient CBC/CMP  Follow EKG outpatient to follow qt eval Was started on zoloft here - follow qt intermittently outpatient Avoid QT prolonging meds in future Hold mounjaro  at discharge - consider resuming outpatient, but would watch closely for N/V and tolerance of PO   Follow with cardiology outpatient Follow with psychiatry outpatient   Discharge Diagnoses:  Principal Problem:   Torsades de pointes (HCC) Active Problems:   Mild protein-calorie malnutrition   Anxiety and depression   Diabetes mellitus (HCC)  Discharge Condition: stable  Diet recommendation: heart healthy  Filed Weights   03/21/24 0428 03/22/24 0500 03/23/24 0416  Weight: 111.5 kg 111.3 kg 110.6 kg    History of present illness:   43 yo with hx HIV, HTN, obesity, dyslipidemia, hx electrolyte abnormalities and multiple other medical issues who presented with N/V/D.  In the ED she had seizures and syncope.  Telemetry showed torsades de pointes.  She was started on Markesia Crilly lidocaine  gtt and admitted to ICU. She had R/LHC.  We've adjusted her medications.    Significant Events 03/19/2024, ED: Lidocaine  drip due to prolonged Qtc (514 msec). Given 1 L NS 0.9%, Kcl, and Mg IV. Seen by EP 11/27 R/LHC required defibrillation x1, angiographically normal coronary, mild pulm HTN, hyperdynamic LV EF, temporary wire in place 11/29 extubated Transferred to [facility] 12/1, stable for discharge.  EKG with [facility] Course:  Assessment and Plan:  Torsades de Pointes Suspect related to medications - lithium , fluconazole , compazine , prozac , etc S/p defibrillation x1  Now off lidocaine  Was initially admitted to the CVICU S/p R/LHC, angiographically normal coronary arteries, mild pulm  hypertension, hyperdynamic LV - temporary wire in RV Echo with EF 55%, no RWMA, moderately reduced RVSF EKG on day of discharge with QTc 462 HF team now signed off, recommending avoiding all QT prolonging meds EP team signed off, recommending avoiding all QT prolonging medications  ST elevation Elevated Troponin LHC with normal coronaries ASA/statin Follow with cards  Acute Hypoxic Respiratory Failure Resolved, extubated  Otitis Externa Topical drops  HIV [name]  Follow with ID outaptient  Mood Disorder Depression  Anxiety Discharging on zoloft Continue xanax  Follow with psych outpatient - avoid qt prolonging meds  HTN Amlodipine  DM A1c 4.8   GERD PPI  Class III Obesity Body mass index is 43.88 kg/m. Hold mounjaro  at discharge with concern for N/V/D and electrolyte abnormalities - would follow with PCP prior to resume      Procedures: [facility] FINDINGS  Incessant Recurrent Polymorphic Ventricular Tachycardia/Torsades upon required defibrillation x 1 otherwise with self terminating Angiographically Normal Coronary Arteries Mild Pulmonary Hypertension with mean PAP 29 mmHg and Detravion Tester PCWP of 19 mm/LVEDP of 16 mmHg. Hyperdynamic left ventricular function EF roughly 65 to 70%, Cardiac Output and Index also elevated. Fick 9.9-4.66; thermodilution 8.01-3.77 with AF sat 100% PA sat of 86% on ventilator        RECOMMENDATIONS Admit to CVICU on Versed  and fentanyl  drip. Arterial oxygen in place, can continue to use for monitoring pressures and oxygen saturations until adequate alternative access of obtained. Temporary wire is in place localized in the RV set up to Zoie Sarin backup pace at 100 bpm at 5 MA if turned on.  Currently off. Continue to replete electrolytes    Echo IMPRESSIONS  1. Left ventricular ejection fraction, by estimation, is 55%. The left  ventricle has normal function. The left ventricle has no regional wall  motion abnormalities. Left ventricular  diastolic parameters are  indeterminate.   2. Right ventricular systolic function is moderately reduced. The right  ventricular size is mildly enlarged.   3. The mitral valve is normal in structure. Trivial mitral valve  regurgitation. No evidence of mitral stenosis.   4. Tricuspid valve regurgitation is mild to moderate.   5. The aortic valve is tricuspid. Aortic valve regurgitation is not  visualized. No aortic stenosis is present.   Comparison(s): No prior Echocardiogram.   Conclusion(s)/Recommendation(s): Normal LV systolic function with  moderately reduced RV systolic function. No significant valvular disease.    Consultations: Cardiology EP PCCM  Discharge Exam: Vitals:   03/23/24 0817 03/23/24 1126  BP:  (!) 107/59  Pulse:  73  Resp:  14  Temp:  97.7 F (36.5 C)  SpO2: 95% 96%   No new complaints Asking if she'll go home today (discussed will need to first see how she does with therapy)  General: No acute distress. Normal R TM, L canal erythematous/edematous and tender with insertion of speculum Cardiovascular: RRR Lungs: Clear to auscultation bilaterally  Abdomen: Soft, nontender, nondistended  Neurological: Alert and oriented 3. Moves all extremities 4 with equal strength. Cranial nerves II through XII grossly intact. Extremities: No clubbing or cyanosis. No edema.   Discharge Instructions   Discharge Instructions     Call MD for:  difficulty breathing, headache or visual disturbances   Complete by: As directed    Call MD for:  extreme fatigue   Complete by: As directed    Call MD for:  hives   Complete by: As directed    Call MD for:  persistant dizziness or light-headedness   Complete by: As directed    Call MD for:  persistant nausea and vomiting   Complete by: As directed    Call MD for:  redness, tenderness, or signs of infection (pain, swelling, redness, odor or green/yellow discharge around incision site)   Complete by: As directed    Call  MD for:  severe uncontrolled pain   Complete by: As directed    Call MD for:  temperature >100.4   Complete by: As directed    Diet - low sodium heart healthy   Complete by: As directed    Discharge instructions   Complete by: As directed    You were seen for an arrhythmia (abnormal heart rhythm) called torsades de pointes.  We think this was related to medications that can cause QT prolongation (fluconazole , hydroxyzine , lithium , compazine , etc).  We've started you on zoloft.  I recommend not taking lithium  or hydroxyzine  at discharge.  You should avoid QT prolonging medicines in the future.    Any new medicine should be reviewed with your prescriber regarding the potential for QT prolongation.  Anti nausea medicines, antidepressants and other psychiatric medicines as well as fluconazole  and some other antibiotics can sometimes cause QT prolongation so before you start Yogesh Cominsky new medicine or increase Samad Thon dose, make sure your providers are aware of your history of an abnormal heart rhythm related to QT prolongation.  Follow up with your psychiatrist regarding your psychiatric meds.  They'll need to check EKG's when adjusting the dose of your medicines or considering new medicines.   We've started you on ear drops for otitis externa.    Follow with Dr. Eben as an outpatient.  You should have repeat labs and an EKG in follow up.  Return for new, recurrent, or worsening symptoms.  Please ask your PCP to request records from this hospitalization so they know what was done and what the next steps will be.   Increase activity slowly   Complete by: As directed    No wound care   Complete by: As directed       Allergies as of 03/23/2024       Reactions   Citalopram Hydrobromide Other (See Comments)   Felt weird 04/2021 Admission for pVT/torsades 03/2024    Lithium  Other (See Comments)   Required hospitalization for VT/torsades 03/2024 > would not restart     Cephalexin  Other (See Comments)    Burning face   Fluconazole  Other (See Comments)   Required hospitalization for VT/torsades > would not restart     Hydrocodone -acetaminophen  Hives, Itching   Latex Other (See Comments)   As Fernado Brigante teenager, when using latex condoms, she would get Nakiea Metzner yeast infection.   Mucinex  Fast-max Chest Cong Ms [guaifenesin ] Nausea And Vomiting   Ancef  [cefazolin ] Rash   Lisinopril Cough        Medication List     PAUSE taking these medications    Mounjaro  2.5 MG/0.5ML Pen Wait to take this until your doctor or other care provider tells you to start again. Hold this until you follow with your outpatient provider Generic drug: tirzepatide  Inject 2.5 mg into the skin once Louvenia Golomb week.       STOP taking these medications    hydrOXYzine  25 MG tablet Commonly known as: ATARAX    traZODone 50 MG tablet Commonly known as: DESYREL       TAKE these medications    albuterol  108 (90 Base) MCG/ACT inhaler Commonly known as: VENTOLIN  HFA INHALE 1 PUFF INTO THE LUNGS EVERY 6 HOURS AS NEEDED FOR WHEEZING OR SHORTNESS OF BREATH   ALPRAZolam  1 MG tablet Commonly known as: XANAX  Take 1 tablet (1 mg total) by mouth 3 (three) times daily as needed for anxiety.   amLODipine 5 MG tablet Commonly known as: NORVASC Take 1 tablet (5 mg total) by mouth daily. Follow with your PCP for refills Start taking on: March 24, 2024   aspirin  81 MG chewable tablet Chew 1 tablet (81 mg total) by mouth daily. Start taking on: March 24, 2024   atorvastatin  80 MG tablet Commonly known as: LIPITOR Take 1 tablet (80 mg total) by mouth daily. Start taking on: March 24, 2024   Blood Glucose Monitoring Suppl Devi 1 each by Does not apply route as directed. Dispense based on patient and insurance preference. Use up to four times daily as directed. (FOR ICD-10 E10.9, E11.9).   Accu-Chek Guide Me w/Device Kit USE TO TEST UP TO 4 TIMES DAILY AS DIRECTED   BLOOD GLUCOSE TEST STRIPS Strp 1 each by Does not  apply route as directed. Dispense based on patient and insurance preference. Use up to four times daily as directed. (FOR ICD-10 E10.9, E11.9).   budesonide -formoterol  80-4.5 MCG/ACT inhaler Commonly known as: Symbicort  Inhale 2 puffs into the lungs daily. What changed:  when to take this reasons to take this   [name]  50-300 MG tablet Generic drug: dolutegravir -lamiVUDine  Take 1 tablet by mouth every evening.   folic acid  1 MG tablet Commonly known as: FOLVITE  Take 1 tablet (1 mg total) by mouth daily.   Imodium Rita Prom-D 2 MG capsule Generic drug: loperamide Take 2 mg by mouth as needed for  diarrhea or loose stools.   Iron  (Ferrous Sulfate ) 325 (65 Fe) MG Tabs Take 1 tablet by mouth daily. What changed:  how much to take when to take this   Lancet Device Misc 1 each by Does not apply route as directed. Dispense based on patient and insurance preference. Use up to four times daily as directed. (FOR ICD-10 E10.9, E11.9).   Lancets Misc 1 each by Does not apply route as directed. Dispense based on patient and insurance preference. Use up to four times daily as directed. (FOR ICD-10 E10.9, E11.9).   Magnesium  200 MG Tabs Take 2 tablets (400 mg total) by mouth daily.   naloxone 4 MG/0.1ML Liqd nasal spray kit Commonly known as: NARCAN Place 1 spray into the nose once as needed (AS DIRECTED FOR Elda Dunkerson CRISIS).   NEOMYCIN-POLYMYXIN-HYDROCORTISONE 1 % Soln OTIC solution Commonly known as: CORTISPORIN Place 3 drops into the left ear every 6 (six) hours for 7 days.   pantoprazole  40 MG tablet Commonly known as: PROTONIX  Take 1 tablet (40 mg total) by mouth daily. What changed: when to take this   sertraline 50 MG tablet Commonly known as: ZOLOFT Take 1 tablet (50 mg total) by mouth at bedtime. Follow with your psychiatrist for refills.  You should probably get an EKG to evaluate for QT prolongation.       Allergies  Allergen Reactions   Citalopram Hydrobromide Other (See  Comments)    Felt weird 04/2021 Admission for pVT/torsades 03/2024    Lithium  Other (See Comments)    Required hospitalization for VT/torsades 03/2024 > would not restart     Cephalexin  Other (See Comments)    Burning face   Fluconazole  Other (See Comments)    Required hospitalization for VT/torsades > would not restart     Hydrocodone -Acetaminophen  Hives and Itching   Latex Other (See Comments)    As Ashad Fawbush teenager, when using latex condoms, she would get Icela Glymph yeast infection.   Mucinex  Fast-Max Chest Cong Ms [facility] ] Nausea And Vomiting   Ancef  [Cefazolin ] Rash   Lisinopril Cough    Follow-up Information     [facility] Follow up.   Specialty: Home Health Services Why: Physical and Occupational Therapy-office to call with visit times. Contact information: [address] [place] [address] [phone]                  The results of significant diagnostics from this hospitalization (including imaging, microbiology, ancillary and laboratory) are listed below for reference.    Significant Diagnostic Studies: ECHOCARDIOGRAM COMPLETE Result Date: 03/20/2024    ECHOCARDIOGRAM REPORT   Patient Name:   Lindsay French Date of Exam: 03/20/2024 Medical Rec #:  [id number]   Height:       62.0 in Accession #:    [id number]  Weight:       247.4 lb Date of Birth:  1980-06-19   BSA:          2.092 m Patient Age:    43 years    BP:           87/61 mmHg Patient Gender: F           HR:           66 bpm. Exam Location:  Inpatient Procedure: 2D Echo and Intracardiac Opacification Agent (Both Spectral and Color            Flow Doppler were utilized during procedure). Indications:    Ventricular Tachycardia I47.2  History:  Patient has no prior history of Echocardiogram examinations.  Sonographer:    Charmaine Gaskins Referring Phys: [id number] MICHAEL ANDREW TILLERY  Sonographer Comments: Echo performed with patient supine and on artificial respirator. Image acquisition  challenging due to patient body habitus. IMPRESSIONS  1. Left ventricular ejection fraction, by estimation, is 55%. The left ventricle has normal function. The left ventricle has no regional wall motion abnormalities. Left ventricular diastolic parameters are indeterminate.  2. Right ventricular systolic function is moderately reduced. The right ventricular size is mildly enlarged.  3. The mitral valve is normal in structure. Trivial mitral valve regurgitation. No evidence of mitral stenosis.  4. Tricuspid valve regurgitation is mild to moderate.  5. The aortic valve is tricuspid. Aortic valve regurgitation is not visualized. No aortic stenosis is present. Comparison(s): No prior Echocardiogram. Conclusion(s)/Recommendation(s): Normal LV systolic function with moderately reduced RV systolic function. No significant valvular disease. FINDINGS  Left Ventricle: Left ventricular ejection fraction, by estimation, is 55%. The left ventricle has normal function. The left ventricle has no regional wall motion abnormalities. Definity contrast agent was given IV to delineate the left ventricular endocardial borders. The left ventricular internal cavity size was normal in size. There is no left ventricular hypertrophy. Left ventricular diastolic parameters are indeterminate. Right Ventricle: The right ventricular size is mildly enlarged. No increase in right ventricular wall thickness. Right ventricular systolic function is moderately reduced. Left Atrium: Left atrial size was normal in size. Right Atrium: Right atrial size was normal in size. Pericardium: There is no evidence of pericardial effusion. Mitral Valve: The mitral valve is normal in structure. Trivial mitral valve regurgitation. No evidence of mitral valve stenosis. Tricuspid Valve: The tricuspid valve is normal in structure. Tricuspid valve regurgitation is mild to moderate. No evidence of tricuspid stenosis. Aortic Valve: The aortic valve is tricuspid. Aortic  valve regurgitation is not visualized. No aortic stenosis is present. Aortic valve mean gradient measures 4.0 mmHg. Aortic valve peak gradient measures 6.2 mmHg. Aortic valve area, by VTI measures 2.37 cm. Pulmonic Valve: The pulmonic valve was normal in structure. Pulmonic valve regurgitation is not visualized. No evidence of pulmonic stenosis. Aorta: The aortic root and ascending aorta are structurally normal, with no evidence of dilitation. Venous: IVC assessment for right atrial pressure unable to be performed due to mechanical ventilation. IAS/Shunts: No atrial level shunt detected by color flow Doppler.  LEFT VENTRICLE PLAX 2D LVIDd:         4.70 cm   Diastology LVIDs:         3.20 cm   LV e' medial:    8.05 cm/s LV PW:         1.00 cm   LV E/e' medial:  8.8 LV IVS:        0.90 cm   LV e' lateral:   6.09 cm/s LVOT diam:     1.94 cm   LV E/e' lateral: 11.6 LV SV:         58 LV SV Index:   28 LVOT Area:     2.96 cm  RIGHT VENTRICLE RV Basal diam:  3.42 cm RV Mid diam:    3.79 cm RV S prime:     8.81 cm/s TAPSE (M-mode): 1.3 cm LEFT ATRIUM             Index        RIGHT ATRIUM           Index LA diam:        3.13 cm  1.50 cm/m   RA Area:     13.50 cm LA Vol (A2C):   68.2 ml 32.59 ml/m  RA Volume:   31.50 ml  15.05 ml/m LA Vol (A4C):   42.3 ml 20.22 ml/m LA Biplane Vol: 56.3 ml 26.91 ml/m  AORTIC VALVE AV Area (Vmax):    2.50 cm AV Area (Vmean):   2.22 cm AV Area (VTI):     2.37 cm AV Vmax:           124.00 cm/s AV Vmean:          97.900 cm/s AV VTI:            0.244 m AV Peak Grad:      6.2 mmHg AV Mean Grad:      4.0 mmHg LVOT Vmax:         105.00 cm/s LVOT Vmean:        73.600 cm/s LVOT VTI:          0.196 m LVOT/AV VTI ratio: 0.80  AORTA Ao Root diam: 2.69 cm Ao Asc diam:  3.06 cm MITRAL VALVE               TRICUSPID VALVE MV Area (PHT): 3.19 cm    TR Peak grad:   20.1 mmHg MV Decel Time: 238 msec    TR Vmax:        224.00 cm/s MV E velocity: 70.70 cm/s MV Terrie Grajales velocity: 30.90 cm/s  SHUNTS MV E/Matthews Franks  ratio:  2.29        Systemic VTI:  0.20 m                            Systemic Diam: 1.94 cm Georganna Archer Electronically signed by Georganna Archer Signature Date/Time: 03/20/2024/1:32:10 PM    Final    DG Abd Portable 1V Result Date: 03/20/2024 EXAM: 1 VIEW XRAY OF THE ABDOMEN 03/20/2024 04:02:00 AM COMPARISON: None available. CLINICAL HISTORY: [id number] Encounter for orogastric (OG) tube placement [id number] Encounter for orogastric (OG) tube placement FINDINGS: LINES, TUBES AND DEVICES: An enteric catheter is present with its tip in the distal stomach. Willona Phariss cardiac pacer lead is present which has been placed via an inferior approach. BOWEL: Nonobstructive bowel gas pattern. SOFT TISSUES: No opaque urinary calculi. BONES: No acute osseous abnormality. HEART AND MEDIASTINUM: The heart is mildly enlarged. IMPRESSION: 1. Enteric catheter tip in the distal stomach. 2. Mildly enlarged heart with cardiac pacer lead in place via an inferior approach. Electronically signed by: Evalene Coho MD 03/20/2024 04:32 AM EST RP Workstation: [id number]   DG Chest Port 1 View Result Date: 03/19/2024 EXAM: 1 VIEW(S) XRAY OF THE CHEST 03/19/2024 11:13:00 PM COMPARISON: 11/09/2020 CLINICAL HISTORY: Check endotracheal tube placement. FINDINGS: LINES, TUBES AND DEVICES: The endotracheal tube is seen 3.5 cm above the carina. LUNGS AND PLEURA: Lungs are hypoinflated but clear. No pleural effusion. No pneumothorax. HEART AND MEDIASTINUM: Cardiac shadow is accentuated by the portable technique. BONES AND SOFT TISSUES: No bony abnormality is noted. IMPRESSION: 1. Endotracheal tube appropriately positioned, approximately 3.5 cm above the carina. 2. Low lung volumes without focal consolidation or edema. Electronically signed by: Oneil Devonshire MD 03/19/2024 11:19 PM EST RP Workstation: [id number]   CARDIAC CATHETERIZATION Result Date: 03/19/2024 Table formatting from the original result was not included. Images from the original result were  not included.   There is hyperdynamic left ventricular systolic function.  The left ventricular ejection fraction is greater than  65% by visual estimate.  LV end diastolic pressure is mildly elevated.   Hemodynamic findings consistent with mild pulmonary hypertension.   There is no aortic valve stenosis. Dominance: Right     Left Ventriculography:EF > 65%, hyperdynamic FINDINGS Incessant Recurrent Polymorphic Ventricular Tachycardia/Torsades upon required defibrillation x 1 otherwise with self terminating Angiographically Normal Coronary Arteries Mild Pulmonary Hypertension with mean PAP 29 mmHg and Gregory Dowe PCWP of 19 mm/LVEDP of 16 mmHg. Hyperdynamic left ventricular function EF roughly 65 to 70%, Cardiac Output and Index also elevated. Fick 9.9-4.66; thermodilution 8.01-3.77 with AF sat 100% PA sat of 86% on ventilator   RECOMMENDATIONS Admit to CVICU on Versed  and fentanyl  drip. Arterial oxygen in place, can continue to use for monitoring pressures and oxygen saturations until adequate alternative access of obtained. Temporary wire is in place localized in the RV set up to Khloee Garza backup pace at 100 bpm at 5 MA if turned on.  Currently off. Continue to replete electrolytes Alm Clay, MD    Microbiology: Recent Results (from the past 240 hours)  MRSA Next Gen by PCR, Nasal     Status: Abnormal   Collection Time: 03/20/24 12:13 AM   Specimen: Nasal Mucosa; Nasal Swab  Result Value Ref Range Status   MRSA by PCR Next Gen DETECTED (Ludella Pranger) NOT DETECTED Final    Comment: RESULT CALLED TO, READ BACK BY AND VERIFIED WITH: J TORRES RN 03/20/2024 @ 0155 BY AB (NOTE) The GeneXpert MRSA Assay (FDA approved for NASAL specimens only), is one component of Myalee Stengel comprehensive MRSA colonization surveillance program. It is not intended to diagnose MRSA infection nor to guide or monitor treatment for MRSA infections. Test performance is not FDA approved in patients less than 42 years old. Performed at [facility],  [address], [place], [place] [address]      Labs: Basic Metabolic Panel: Recent Labs  Lab 03/19/24 1839 03/19/24 2128 03/20/24 0525 03/20/24 0954 03/20/24 1225 03/21/24 0414 03/22/24 0211 03/23/24 0628  NA 136   < > 137 137 138 134* 133* 134*  K 3.7   < > 4.1 3.9 4.3 3.6 4.1 3.4*  CL 96*  --  101  --   --  101 97* 97*  CO2 20*  --  26  --   --  22 25 29   GLUCOSE 134*  --  127*  --   --  103* 102* 85  BUN <5*  --  <5*  --   --  <5* <5* <5*  CREATININE 0.75  --  0.49  --   --  0.63 0.53 0.50  CALCIUM  8.2*  --  7.6*  --   --  7.1* 7.4* 7.8*  MG 1.7  --  2.7*  --   --  2.2 2.1 1.9  PHOS 1.7*  --  6.6*  --   --  2.7 2.9 1.6*   < > = values in this interval not displayed.   Liver Function Tests: Recent Labs  Lab 03/19/24 1839 03/20/24 0525 03/21/24 0414 03/22/24 0211 03/23/24 0628  AST 55* 33 29 32 28  ALT 48* 37 28 27 21   ALKPHOS 113 103 94 96 96  BILITOT 1.2 1.3* 1.2 2.1* 1.3*  PROT 7.0 6.3* 5.7* 6.0* 5.7*  ALBUMIN 3.5 3.0* 2.8* 2.9* 2.7*   Recent Labs  Lab 03/19/24 2315  LIPASE 24   No results for input(s): AMMONIA in the last 168 hours. CBC: Recent Labs  Lab 03/19/24 1839 03/19/24 2128 03/19/24  2315 03/20/24 0525 03/20/24 0954 03/20/24 1128 03/20/24 1225 03/21/24 0414  WBC 11.7*  --  13.0* 10.1  --  10.3  --  8.9  NEUTROABS 9.6*  --   --   --   --   --   --   --   HGB 15.0   < > 14.5 13.8 15.0 13.0 14.3 12.3  HCT 45.5   < > 43.4 43.4 44.0 41.4 42.0 38.5  MCV 101.6*  --  99.1 104.1*  --  106.4*  --  104.6*  PLT 312  --  295 284  --  224  --  196   < > = values in this interval not displayed.   Cardiac Enzymes: Recent Labs  Lab 03/19/24 2315  CKTOTAL 84   BNP: BNP (last 3 results) No results for input(s): BNP in the last 8760 hours.  ProBNP (last 3 results) No results for input(s): PROBNP in the last 8760 hours.  CBG: Recent Labs  Lab 03/22/24 2039 03/23/24 0012 03/23/24 0415 03/23/24 0740 03/23/24 1123  GLUCAP 121* 99 90 94  102*       Signed:  Meliton Monte MD.  [facility] 03/23/2024, 5:17 PM

## 2024-03-23 NOTE — Consult Note (Addendum)
 [facility]    Date of Admission:  03/19/2024                  Reason for Consult: HIV+   Referring Provider: Powell/PCP f/u   Assessment: Prolonged QT (now 414) Torsades DM2 Morbid Obesity Anxiety d/o Mild protein calorie malnutrition   Plan: Continue dovato  She was undetectable, CD4 1229 (12-03-23). Would not repeat her CD4 and HIV RNA at this point.  Appreciate consultants (CV and CCM) and TRH Appreciate PT/OT eval Nutrition Prozac  changed to zoloft  Comment She can be difficult to see in clinic due to her anxiety limiting her social interactions.  I will see her in f/u in 1 week.   Thank you so much for this interesting consult,  Principal Problem:   Torsades de pointes (HCC)    amLODipine  5 mg Oral Daily   arformoterol  15 mcg Nebulization BID   aspirin   81 mg Oral Daily   atorvastatin   80 mg Oral Daily   budesonide  (PULMICORT ) nebulizer solution  0.5 mg Nebulization BID   carbamide peroxide  5 drop Left EAR BID   dolutegravir -lamiVUDine   1 tablet Oral QPM   insulin  aspart  0-9 Units Subcutaneous Q4H   magic mouthwash w/lidocaine   5 mL Oral QID   mupirocin  ointment  1 Application Nasal BID   NEOMYCIN-POLYMYXIN-HYDROCORTISONE  3 drop Left EAR Q6H   pantoprazole  (PROTONIX ) IV  40 mg Intravenous Q24H   potassium chloride   40 mEq Oral Q4H   sertraline  50 mg Oral QHS   sodium chloride  flush  10-40 mL Intracatheter Q12H   sodium chloride  flush  3 mL Intravenous Q12H    HPI: Lindsay French is a 43 y.o. female with previous hospitalization for hypoK/Mg due to intractable n/v due ozempic .  She returned 11-27 with n/v and felt like she was having a panic attack that would not resolve.  In ED she was found to have torsades, prolonged QT, and was started on lidocaine . She had seizure in ED as well. She underwent L/RHC and was found to have normal coronaries. EF 65-70%. Her QT prolonging drugs were stopped (fluconazole , prozac ,  lithium , compazine ). She was started on sertraline. Her dovato  has continued. She was intubated to help with her sympathetic drive, extb on 88-69   Review of Systems: Review of Systems  Constitutional:  Positive for malaise/fatigue.  Respiratory:  Positive for cough.   Gastrointestinal:  Negative for nausea and vomiting.    Past Medical History:  Diagnosis Date   ADHD    Anxiety    Asthma    usually with colds   Avascular necrosis (HCC)    Back pain    Depression    Diabetes mellitus without complication (HCC)    Elevated LFTs    Fatty liver 04/16/2016   Noted US  ABD   GERD (gastroesophageal reflux disease)    Heart murmur    at birth, no issues   History of heat stroke    with syncope   History of thrombocytopenia    prior to diagnosis HIV   HIV (human immunodeficiency virus infection) (HCC)    Hyperlipidemia    Hypertension    Hypothyroidism    Infertility, female    Obese    PCOS (polycystic ovarian syndrome)    Pneumonia 04/17/2016   SOB (shortness of breath)    Wears partial dentures    upper   Wrist fracture 11/2017  Left    Social History   Tobacco Use   Smoking status: Former    Current packs/day: 0.30    Average packs/day: 0.3 packs/day for 15.0 years (4.5 ttl pk-yrs)    Types: Cigarettes   Smokeless tobacco: Never  Vaping Use   Vaping status: Never Used  Substance Use Topics   Alcohol use: Yes    Alcohol/week: 7.0 standard drinks of alcohol    Types: 7 Glasses of wine per week    Comment: wine  2-3 times a week   Drug use: Not Currently    Types: Crack cocaine    Comment: Previous hx of crack use - clean for 7 years     Family History  Problem Relation Age of Onset   Thyroid disease Mother    Hypothyroidism Mother    Depression Mother    Obesity Mother    Obesity Father    Depression Father    Hyperlipidemia Father    Hypertension Father      Medications: I have reviewed the patient's current medications.  Abtx:   Anti-infectives (From admission, onward)    Start     Dose/Rate French Frequency Ordered Stop   03/21/24 1800  dolutegravir -lamiVUDine  ([name] ) 50-300 MG per tablet 1 tablet        1 tablet Oral Every evening 03/19/24 2006           OBJECTIVE: Blood pressure 111/60, pulse 74, temperature 97.6 F (36.4 C), temperature source Oral, resp. rate 18, height 5' 2.5 (1.588 m), weight 110.6 kg, SpO2 95%.  Physical Exam Vitals reviewed.  Constitutional:      General: She is not in acute distress.    Appearance: She is obese. She is not ill-appearing or toxic-appearing.  HENT:     Mouth/Throat:     Mouth: Mucous membranes are moist.     Pharynx: No oropharyngeal exudate.  Eyes:     Extraocular Movements: Extraocular movements intact.     Pupils: Pupils are equal, round, and reactive to light.  Cardiovascular:     Rate and Rhythm: Normal rate and regular rhythm.  Pulmonary:     Effort: Pulmonary effort is normal.     Breath sounds: Wheezing present.  Abdominal:     General: Bowel sounds are normal. There is no distension.     Palpations: Abdomen is soft.     Tenderness: There is no abdominal tenderness.  Musculoskeletal:     Cervical back: Normal range of motion and neck supple. No rigidity.     Right lower leg: Edema (non-pitting) present.     Left lower leg: Edema (non-pitting) present.  Skin:    General: Skin is warm.  Neurological:     General: No focal deficit present.     Mental Status: She is alert.     Lab Results Results for orders placed or performed during the hospital encounter of 03/19/24 (from the past 48 hours)  Glucose, capillary     Status: Abnormal   Collection Time: 03/21/24 11:59 AM  Result Value Ref Range   Glucose-Capillary 115 (H) 70 - 99 mg/dL    Comment: Glucose reference range applies only to samples taken after fasting for at least 8 hours.  Glucose, capillary     Status: Abnormal   Collection Time: 03/21/24  4:50 PM  Result Value Ref Range    Glucose-Capillary 119 (H) 70 - 99 mg/dL    Comment: Glucose reference range applies only to samples taken after fasting for at least  8 hours.  Glucose, capillary     Status: Abnormal   Collection Time: 03/21/24  7:33 PM  Result Value Ref Range   Glucose-Capillary 124 (H) 70 - 99 mg/dL    Comment: Glucose reference range applies only to samples taken after fasting for at least 8 hours.  Glucose, capillary     Status: Abnormal   Collection Time: 03/21/24 11:40 PM  Result Value Ref Range   Glucose-Capillary 105 (H) 70 - 99 mg/dL    Comment: Glucose reference range applies only to samples taken after fasting for at least 8 hours.  Magnesium      Status: None   Collection Time: 03/22/24  2:11 AM  Result Value Ref Range   Magnesium  2.1 1.7 - 2.4 mg/dL    Comment: Performed at [facility], [address], [place], [place] [address]  Comprehensive metabolic panel with GFR     Status: Abnormal   Collection Time: 03/22/24  2:11 AM  Result Value Ref Range   Sodium 133 (L) 135 - 145 mmol/L   Potassium 4.1 3.5 - 5.1 mmol/L   Chloride 97 (L) 98 - 111 mmol/L   CO2 25 22 - 32 mmol/L   Glucose, Bld 102 (H) 70 - 99 mg/dL    Comment: Glucose reference range applies only to samples taken after fasting for at least 8 hours.   BUN <5 (L) 6 - 20 mg/dL   Creatinine, Ser 9.46 0.44 - 1.00 mg/dL   Calcium  7.4 (L) 8.9 - 10.3 mg/dL   Total Protein 6.0 (L) 6.5 - 8.1 g/dL   Albumin 2.9 (L) 3.5 - 5.0 g/dL   AST 32 15 - 41 U/L   ALT 27 0 - 44 U/L   Alkaline Phosphatase 96 38 - 126 U/L   Total Bilirubin 2.1 (H) 0.0 - 1.2 mg/dL   GFR, Estimated >39 >39 mL/min    Comment: (NOTE) Calculated using the CKD-EPI Creatinine Equation (2021)    Anion gap 11 5 - 15    Comment: Performed at [facility], [address], [place], [place] [address]  Phosphorus     Status: None   Collection Time: 03/22/24  2:11 AM  Result Value Ref Range   Phosphorus 2.9 2.5 - 4.6 mg/dL    Comment: Performed at [facility], [address], [place], [place] [address]  Glucose, capillary     Status: Abnormal   Collection Time: 03/22/24  3:08 AM  Result Value Ref Range   Glucose-Capillary 110 (H) 70 - 99 mg/dL    Comment: Glucose reference range applies only to samples taken after fasting for at least 8 hours.  Glucose, capillary     Status: None   Collection Time: 03/22/24  6:04 AM  Result Value Ref Range   Glucose-Capillary 92 70 - 99 mg/dL    Comment: Glucose reference range applies only to samples taken after fasting for at least 8 hours.  Glucose, capillary     Status: Abnormal   Collection Time: 03/22/24  7:40 AM  Result Value Ref Range   Glucose-Capillary 112 (H) 70 - 99 mg/dL    Comment: Glucose reference range applies only to samples taken after fasting for at least 8 hours.  Glucose, capillary     Status: Abnormal   Collection Time: 03/22/24 11:38 AM  Result Value Ref Range   Glucose-Capillary 104 (H) 70 - 99 mg/dL    Comment: Glucose reference range applies only to samples taken after  fasting for at least 8 hours.  Glucose, capillary     Status: Abnormal   Collection Time: 03/22/24  3:53 PM  Result Value Ref Range   Glucose-Capillary 111 (H) 70 - 99 mg/dL    Comment: Glucose reference range applies only to samples taken after fasting for at least 8 hours.  Glucose, capillary     Status: Abnormal   Collection Time: 03/22/24  8:39 PM  Result Value Ref Range   Glucose-Capillary 121 (H) 70 - 99 mg/dL    Comment: Glucose reference range applies only to samples taken after fasting for at least 8 hours.  Glucose, capillary     Status: None   Collection Time: 03/23/24 12:12 AM  Result Value Ref Range   Glucose-Capillary 99 70 - 99 mg/dL    Comment: Glucose reference range applies only to samples taken after fasting for at least 8 hours.  Glucose, capillary     Status: None   Collection Time: 03/23/24  4:15 AM  Result Value Ref Range   Glucose-Capillary 90 70 - 99 mg/dL    Comment:  Glucose reference range applies only to samples taken after fasting for at least 8 hours.  Magnesium      Status: None   Collection Time: 03/23/24  6:28 AM  Result Value Ref Range   Magnesium  1.9 1.7 - 2.4 mg/dL    Comment: Performed at [facility], [address] [place], [place] [address]  Comprehensive metabolic panel with GFR     Status: Abnormal   Collection Time: 03/23/24  6:28 AM  Result Value Ref Range   Sodium 134 (L) 135 - 145 mmol/L   Potassium 3.4 (L) 3.5 - 5.1 mmol/L   Chloride 97 (L) 98 - 111 mmol/L   CO2 29 22 - 32 mmol/L   Glucose, Bld 85 70 - 99 mg/dL    Comment: Glucose reference range applies only to samples taken after fasting for at least 8 hours.   BUN <5 (L) 6 - 20 mg/dL   Creatinine, Ser 9.49 0.44 - 1.00 mg/dL   Calcium  7.8 (L) 8.9 - 10.3 mg/dL   Total Protein 5.7 (L) 6.5 - 8.1 g/dL   Albumin 2.7 (L) 3.5 - 5.0 g/dL   AST 28 15 - 41 U/L   ALT 21 0 - 44 U/L   Alkaline Phosphatase 96 38 - 126 U/L   Total Bilirubin 1.3 (H) 0.0 - 1.2 mg/dL   GFR, Estimated >39 >39 mL/min    Comment: (NOTE) Calculated using the CKD-EPI Creatinine Equation (2021)    Anion gap 8 5 - 15    Comment: Performed at [facility], [address], [place], [place] [address]  Phosphorus     Status: Abnormal   Collection Time: 03/23/24  6:28 AM  Result Value Ref Range   Phosphorus 1.6 (L) 2.5 - 4.6 mg/dL    Comment: Performed at [facility], [address], [place], [place] [address]  Glucose, capillary     Status: None   Collection Time: 03/23/24  7:40 AM  Result Value Ref Range   Glucose-Capillary 94 70 - 99 mg/dL    Comment: Glucose reference range applies only to samples taken after fasting for at least 8 hours.      Component Value Date/Time   SDES  10/12/2020 0725    BLOOD LEFT ANTECUBITAL Performed at [facility], [address], [place], [place] [address]    JULIANE  10/12/2020 [phone]  BOTTLES DRAWN AEROBIC AND ANAEROBIC Blood  Culture adequate volume Performed at [facility], [address] [place], [place] [address]    CULT  10/12/2020 0725    NO GROWTH 5 DAYS Performed at [facility], [address], [place], [place] [address]    REPTSTATUS 10/17/2020 FINAL 10/12/2020 0725   No results found. Recent Results (from the past 240 hours)  MRSA Next Gen by PCR, Nasal     Status: Abnormal   Collection Time: 03/20/24 12:13 AM   Specimen: Nasal Mucosa; Nasal Swab  Result Value Ref Range Status   MRSA by PCR Next Gen DETECTED (A) NOT DETECTED Final    Comment: RESULT CALLED TO, READ BACK BY AND VERIFIED WITH: J TORRES RN 03/20/2024 @ 0155 BY AB (NOTE) The GeneXpert MRSA Assay (FDA approved for NASAL specimens only), is one component of a comprehensive MRSA colonization surveillance program. It is not intended to diagnose MRSA infection nor to guide or monitor treatment for MRSA infections. Test performance is not FDA approved in patients less than 38 years old. Performed at [facility], [address], [place], [place] [address]     Microbiology: Recent Results (from the past 240 hours)  MRSA Next Gen by PCR, Nasal     Status: Abnormal   Collection Time: 03/20/24 12:13 AM   Specimen: Nasal Mucosa; Nasal Swab  Result Value Ref Range Status   MRSA by PCR Next Gen DETECTED (A) NOT DETECTED Final    Comment: RESULT CALLED TO, READ BACK BY AND VERIFIED WITH: J TORRES RN 03/20/2024 @ 0155 BY AB (NOTE) The GeneXpert MRSA Assay (FDA approved for NASAL specimens only), is one component of a comprehensive MRSA colonization surveillance program. It is not intended to diagnose MRSA infection nor to guide or monitor treatment for MRSA infections. Test performance is not FDA approved in patients less than 30 years old. Performed at [facility], [address] [place], [place] [address]     Radiographs and labs were personally reviewed by me.   Reyes Fenton, MD  [facility] [employer] [facility] [phone] 03/23/2024, 11:23 AM

## 2024-03-23 NOTE — TOC CM/SW Note (Signed)
 Transition of Care [facility]) - Inpatient Brief Assessment   Patient Details  Name: Lindsay French MRN: [id number] Date of Birth: Oct 31, 1980  [facility]) CM/SW Contact:    Sudie Erminio Deems, RN Phone Number: 03/23/2024, 4:52 PM   Clinical Narrative: Patient presented for nausea and was found to be in Torsades. PTA patient was from home with spouse. PT/OT worked with patient and recommendations are for home health. Patient did not have an agency choice. Referral submitted to [facility] and they have accepted the patient via Rowena Shams. Start of Care to begin within 24-48 hours post transition home. Patient states she will have transportation home. No further needs identified at this time.      Transition of Care Asessment: Insurance and Status: Insurance coverage has been reviewed Patient has primary care physician: Yes Home environment has been reviewed: reviewed Prior level of function:: independent Prior/Current Home Services: No current home services Social Drivers of Health Review: SDOH reviewed no interventions necessary Readmission risk has been reviewed: Yes Transition of care needs: transition of care needs identified, TOC will continue to follow

## 2024-03-24 ENCOUNTER — Other Ambulatory Visit (HOSPITAL_COMMUNITY): Payer: Self-pay

## 2024-03-24 ENCOUNTER — Telehealth: Payer: Self-pay

## 2024-03-24 ENCOUNTER — Telehealth (HOSPITAL_COMMUNITY): Payer: Self-pay

## 2024-03-24 ENCOUNTER — Encounter: Admitting: Infectious Diseases

## 2024-03-24 NOTE — Transitions of Care (Post Inpatient/ED Visit) (Signed)
   03/24/2024  Name: Lindsay French MRN: [id number] DOB: Aug 05, 1980  Today's TOC FU Call Status: Today's TOC FU Call Status:: Unsuccessful Call (1st Attempt) Unsuccessful Call (1st Attempt) Date: 03/24/24  Attempted to reach the patient regarding the most recent Inpatient/ED visit.  Follow Up Plan: Additional outreach attempts will be made to reach the patient to complete the Transitions of Care (Post Inpatient/ED visit) call.   Medford Balboa, BSN, RN [place]  VBCI - [employer] [facility] RN Care Manager [phone]

## 2024-03-24 NOTE — Telephone Encounter (Signed)
 Pharmacy Patient Advocate Encounter  Received notification from [facility] MEDICAID that Prior Authorization for Sertraline 50mg  tablet has been APPROVED from 03/24/24 to 04/22/24   PA #/Case ID/Reference #: [id number]

## 2024-03-25 ENCOUNTER — Telehealth: Payer: Self-pay

## 2024-03-25 NOTE — Transitions of Care (Post Inpatient/ED Visit) (Signed)
   03/25/2024  Name: Lindsay French MRN: [id number] DOB: 05-24-1980  Today's TOC FU Call Status: Today's TOC FU Call Status:: Unsuccessful Call (2nd Attempt) Unsuccessful Call (1st Attempt) Date: 03/24/24 Unsuccessful Call (2nd Attempt) Date: 03/25/24  Attempted to reach the patient regarding the most recent Inpatient/ED visit.  Follow Up Plan: Additional outreach attempts will be made to reach the patient to complete the Transitions of Care (Post Inpatient/ED visit) call.   Medford Balboa, BSN, RN [place]  VBCI - [employer] [facility] RN Care Manager [phone]

## 2024-03-26 ENCOUNTER — Telehealth: Payer: Self-pay

## 2024-03-26 NOTE — Transitions of Care (Post Inpatient/ED Visit) (Signed)
 03/26/2024  Name: Lindsay French MRN: [id number] DOB: July 08, 1980  Today's TOC FU Call Status: Today's TOC FU Call Status:: Successful TOC FU Call Completed TOC FU Call Complete Date: 03/26/24  Patient's Name and Date of Birth confirmed. Name, DOB  Transition Care Management Follow-up Telephone Call Date of Discharge: 03/23/24 Discharge Facility: [facility]) Type of Discharge: Inpatient Admission Primary Inpatient Discharge Diagnosis:: Tosades de Pointis How have you been since you were released from the hospital?: Better Any questions or concerns?: No  Items Reviewed: Did you receive and understand the discharge instructions provided?: Yes Medications obtained,verified, and reconciled?: Yes (Medications Reviewed) Any new allergies since your discharge?: No Dietary orders reviewed?: Yes Type of Diet Ordered:: Low sodium Heart Healthy Do you have support at home?: Yes People in Home [RPT]: spouse Name of Support/Comfort Primary Source: [name]  Medications Reviewed Today: Medications Reviewed Today     Reviewed by Moises Reusing, RN (Case Manager) on 03/26/24 at 1508  Med List Status: <None>   Medication Order Taking? Sig Documenting Provider Last Dose Status Informant  albuterol  (VENTOLIN  HFA) 108 (90 Base) MCG/ACT inhaler [id number] No INHALE 1 PUFF INTO THE LUNGS EVERY 6 HOURS AS NEEDED FOR WHEEZING OR SHORTNESS OF BREATH Eben Reyes BROCKS, MD Unknown Active Pharmacy Records, Spouse/Significant Other           Med Note DELETA, DEBBY SAUNDERS   Fri Mar 20, 2024 10:23 AM)    ALPRAZolam  (XANAX ) 1 MG tablet [id number] No Take 1 tablet (1 mg total) by mouth 3 (three) times daily as needed for anxiety. Eben Reyes BROCKS, MD Past Week Active Pharmacy Records, Spouse/Significant Other  amLODipine  (NORVASC ) 5 MG tablet [id number]  Take 1 tablet (5 mg total) by mouth daily. Follow with your PCP for refills Perri DELENA Meliton Mickey., MD  Active   aspirin  81 MG chewable tablet  [id number]  Chew 1 tablet (81 mg total) by mouth daily. Perri DELENA Meliton Mickey., MD  Active   atorvastatin  (LIPITOR) 80 MG tablet [id number]  Take 1 tablet (80 mg total) by mouth daily. Perri DELENA Meliton Mickey., MD  Active   Blood Glucose Monitoring Suppl (ACCU-CHEK GUIDE ME) w/Device PRESSLEY [id number]  USE TO TEST UP TO 4 TIMES DAILY AS DIRECTED [provider]  Active Pharmacy Records, Spouse/Significant Other  Blood Glucose Monitoring Suppl DEVI [id number]  1 each by Does not apply route as directed. Dispense based on patient and insurance preference. Use up to four times daily as directed. (FOR ICD-10 E10.9, E11.9). Eben Reyes BROCKS, MD  Active Pharmacy Records, Spouse/Significant Other  budesonide -formoterol  (SYMBICORT ) 80-4.5 MCG/ACT inhaler [id number] No Inhale 2 puffs into the lungs daily.  Patient taking differently: Inhale 2 puffs into the lungs daily as needed (when sick).   Eben Reyes BROCKS, MD Unknown Active Pharmacy Records, Spouse/Significant Other           Med Note DELETA, DEBBY SAUNDERS   Fri Mar 20, 2024 10:24 AM)    dolutegravir -lamiVUDine  ([name] ) 50-300 MG tablet [id number] No Take 1 tablet by mouth every evening. Eben Reyes BROCKS, MD Past Week Active Pharmacy Records, Spouse/Significant Other  folic acid  (FOLVITE ) 1 MG tablet [id number] No Take 1 tablet (1 mg total) by mouth daily.  Patient not taking: Reported on 03/20/2024   Kenn Pagan, DO Not Taking Active Pharmacy Records, Spouse/Significant Other           Med Note DELETA DEBBY SAUNDERS   Fri Mar 20, 2024  9:26 AM) Last filled March 2025  Glucose Blood (BLOOD GLUCOSE TEST STRIPS) STRP [id number]  1 each by Does not apply route as directed. Dispense based on patient and insurance preference. Use up to four times daily as directed. (FOR ICD-10 E10.9, E11.9). Eben Reyes BROCKS, MD  Active Pharmacy Records, Spouse/Significant Other  IMODIUM A-D 2 MG capsule [id number] No Take 2 mg by mouth as needed for diarrhea or loose  stools. [provider] Past Week Active Pharmacy Records, Spouse/Significant Other  Iron , Ferrous Sulfate , 325 (65 Fe) MG TABS [id number] No Take 1 tablet by mouth daily.  Patient taking differently: Take 325 mg by mouth daily after supper.   Eben Reyes BROCKS, MD Past Week Expired 03/20/24 2359 Self, Spouse/Significant Other  Lancet Device MISC [id number]  1 each by Does not apply route as directed. Dispense based on patient and insurance preference. Use up to four times daily as directed. (FOR ICD-10 E10.9, E11.9). Eben Reyes BROCKS, MD  Active Pharmacy Records, Spouse/Significant Other  Lancets MISC [id number]  1 each by Does not apply route as directed. Dispense based on patient and insurance preference. Use up to four times daily as directed. (FOR ICD-10 E10.9, E11.9). Eben Reyes BROCKS, MD  Active Pharmacy Records, Spouse/Significant Other  Magnesium  200 MG TABS [id number] No Take 2 tablets (400 mg total) by mouth daily. Regalado, Belkys A, MD Past Week Active Pharmacy Records, Spouse/Significant Other           Med Note DELETA, DEBBY SAUNDERS   Fri Mar 20, 2024 10:29 AM) Using an OTC formulation, unsure of strength  MOUNJARO  2.5 MG/0.5ML Pen [id number] No Inject 2.5 mg into the skin once a week. [provider] 03/17/2024 Active Pharmacy Records, Spouse/Significant Other  naloxone [facility]) nasal spray 4 mg/0.1 mL [id number] No Place 1 spray into the nose once as needed (AS DIRECTED FOR A CRISIS). [provider] Unknown Active Pharmacy Records, Spouse/Significant Other  NEOMYCIN-POLYMYXIN-HYDROCORTISONE (CORTISPORIN) 1 % SOLN OTIC solution [id number]  Place 3 drops into the left ear every 6 (six) hours for 7 days. Perri DELENA Meliton Mickey., MD  Active   pantoprazole  (PROTONIX ) 40 MG tablet [id number] No Take 1 tablet (40 mg total) by mouth daily.  Patient taking differently: Take 40 mg by mouth at bedtime.   Eben Reyes BROCKS, MD Past Week Active Pharmacy Records,  Spouse/Significant Other  sertraline (ZOLOFT) 50 MG tablet [id number]  Take 1 tablet (50 mg total) by mouth at bedtime. Follow with your psychiatrist for refills.  You should probably get an EKG to evaluate for QT prolongation. Perri DELENA Meliton Mickey., MD  Active             Home Care and Equipment/Supplies: Were Home Health Services Ordered?: Yes Name of Home Health Agency:: [facility] Has Agency set up a time to come to your home?: Yes First Home Health Visit Date: 03/26/24 Any new equipment or medical supplies ordered?: No  Functional Questionnaire: Do you need assistance with bathing/showering or dressing?: No Do you need assistance with meal preparation?: No Do you need assistance with eating?: No Do you have difficulty maintaining continence: No Do you need assistance with getting out of bed/getting out of a chair/moving?: No Do you have difficulty managing or taking your medications?: Yes (Her spouse manages her medications)  Follow up appointments reviewed: PCP Follow-up appointment confirmed?: Yes Date of PCP follow-up appointment?: 03/31/24 Follow-up Provider: Reyes Eben Specialist [facility] Follow-up appointment confirmed?: NA Do you need transportation to your follow-up appointment?: No Do you understand care options if your condition(s)  worsen?: Yes-patient verbalized understanding  SDOH Interventions Today    Flowsheet Row Most Recent Value  SDOH Interventions   Food Insecurity Interventions Intervention Not Indicated  Housing Interventions Intervention Not Indicated  Transportation Interventions Intervention Not Indicated  Utilities Interventions Intervention Not Indicated    Medford Balboa, BSN, RN [facility]  VBC[employer] - Population Health RN Care Manager [phone]

## 2024-03-28 ENCOUNTER — Encounter (HOSPITAL_COMMUNITY): Payer: Self-pay

## 2024-03-28 ENCOUNTER — Emergency Department (HOSPITAL_COMMUNITY)

## 2024-03-28 ENCOUNTER — Inpatient Hospital Stay (HOSPITAL_COMMUNITY): Admission: EM | Admit: 2024-03-28 | Discharge: 2024-04-01 | DRG: 208 | Disposition: A

## 2024-03-28 ENCOUNTER — Other Ambulatory Visit: Payer: Self-pay

## 2024-03-28 DIAGNOSIS — F411 Generalized anxiety disorder: Secondary | ICD-10-CM | POA: Diagnosis present

## 2024-03-28 DIAGNOSIS — I4721 Torsades de pointes: Secondary | ICD-10-CM | POA: Diagnosis present

## 2024-03-28 DIAGNOSIS — I499 Cardiac arrhythmia, unspecified: Principal | ICD-10-CM

## 2024-03-28 DIAGNOSIS — F339 Major depressive disorder, recurrent, unspecified: Secondary | ICD-10-CM

## 2024-03-28 DIAGNOSIS — I472 Ventricular tachycardia, unspecified: Secondary | ICD-10-CM | POA: Diagnosis not present

## 2024-03-28 DIAGNOSIS — Z21 Asymptomatic human immunodeficiency virus [HIV] infection status: Secondary | ICD-10-CM

## 2024-03-28 DIAGNOSIS — E878 Other disorders of electrolyte and fluid balance, not elsewhere classified: Secondary | ICD-10-CM

## 2024-03-28 DIAGNOSIS — K3184 Gastroparesis: Secondary | ICD-10-CM

## 2024-03-28 LAB — CBC
HCT: 35.8 % — ABNORMAL LOW (ref 36.0–46.0)
Hemoglobin: 12.1 g/dL (ref 12.0–15.0)
MCH: 33.2 pg (ref 26.0–34.0)
MCHC: 33.8 g/dL (ref 30.0–36.0)
MCV: 98.4 fL (ref 80.0–100.0)
Platelets: 311 K/uL (ref 150–400)
RBC: 3.64 MIL/uL — ABNORMAL LOW (ref 3.87–5.11)
RDW: 13.2 % (ref 11.5–15.5)
WBC: 8.4 K/uL (ref 4.0–10.5)
nRBC: 0.6 % — ABNORMAL HIGH (ref 0.0–0.2)

## 2024-03-28 MED ORDER — MAGNESIUM SULFATE 2 GM/50ML IV SOLN
2.0000 g | Freq: Once | INTRAVENOUS | Status: AC
  Administered 2024-03-28: 2 g via INTRAVENOUS
  Filled 2024-03-28: qty 50

## 2024-03-28 MED ORDER — LIDOCAINE BOLUS VIA INFUSION
50.0000 mg | Freq: Once | INTRAVENOUS | Status: DC
  Filled 2024-03-28: qty 52

## 2024-03-28 MED ORDER — LIDOCAINE IN D5W 4-5 MG/ML-% IV SOLN
1.0000 mg/min | INTRAVENOUS | Status: DC
  Administered 2024-03-28 – 2024-03-30 (×2): 1 mg/min via INTRAVENOUS
  Filled 2024-03-28 (×2): qty 500

## 2024-03-28 MED ORDER — LORAZEPAM 2 MG/ML IJ SOLN
2.0000 mg | Freq: Once | INTRAMUSCULAR | Status: AC
  Administered 2024-03-28: 2 mg via INTRAVENOUS

## 2024-03-28 MED ORDER — SODIUM CHLORIDE 0.9 % IV BOLUS
1000.0000 mL | Freq: Once | INTRAVENOUS | Status: AC
  Administered 2024-03-28: 1000 mL via INTRAVENOUS

## 2024-03-28 MED ORDER — LORAZEPAM 2 MG/ML IJ SOLN
1.0000 mg | Freq: Once | INTRAMUSCULAR | Status: DC
  Filled 2024-03-28: qty 1

## 2024-03-28 NOTE — ED Triage Notes (Signed)
 Patient is coming from home with CC of chest pain and shortness of breath. Both began this morning and patient waited to see if symptoms got better. Patient was seen here on 11/27 for torsades and was cardioverted. Patient was discharged 12/1. Patient currently feeling nauseated and complaining of diarrhea due to what she claims is anxiety. Has not been able to take her prescribed medications for the past several days. EMS VS 146/82 BP 90 HR sinus rhythm 98% RA Given 4mg  of IV zofran  from EMS

## 2024-03-28 NOTE — ED Notes (Signed)
 Magnesium  sulfate running at 317ml/hr over 10 minutes per verbal order from MAROLYN Poisson, MD.

## 2024-03-29 ENCOUNTER — Encounter (HOSPITAL_COMMUNITY): Admission: EM | Disposition: A | Payer: Self-pay | Source: Home / Self Care

## 2024-03-29 ENCOUNTER — Encounter (HOSPITAL_COMMUNITY): Payer: Self-pay | Admitting: Cardiology

## 2024-03-29 ENCOUNTER — Inpatient Hospital Stay (HOSPITAL_COMMUNITY)

## 2024-03-29 DIAGNOSIS — I498 Other specified cardiac arrhythmias: Secondary | ICD-10-CM | POA: Diagnosis present

## 2024-03-29 DIAGNOSIS — E119 Type 2 diabetes mellitus without complications: Secondary | ICD-10-CM | POA: Diagnosis not present

## 2024-03-29 DIAGNOSIS — I472 Ventricular tachycardia, unspecified: Secondary | ICD-10-CM | POA: Diagnosis not present

## 2024-03-29 DIAGNOSIS — I471 Supraventricular tachycardia, unspecified: Secondary | ICD-10-CM | POA: Diagnosis present

## 2024-03-29 DIAGNOSIS — D539 Nutritional anemia, unspecified: Secondary | ICD-10-CM | POA: Diagnosis present

## 2024-03-29 DIAGNOSIS — Z79899 Other long term (current) drug therapy: Secondary | ICD-10-CM | POA: Diagnosis not present

## 2024-03-29 DIAGNOSIS — D509 Iron deficiency anemia, unspecified: Secondary | ICD-10-CM | POA: Diagnosis not present

## 2024-03-29 DIAGNOSIS — E876 Hypokalemia: Secondary | ICD-10-CM | POA: Diagnosis not present

## 2024-03-29 DIAGNOSIS — Z21 Asymptomatic human immunodeficiency virus [HIV] infection status: Secondary | ICD-10-CM | POA: Diagnosis not present

## 2024-03-29 DIAGNOSIS — F502 Bulimia nervosa, unspecified: Secondary | ICD-10-CM | POA: Diagnosis present

## 2024-03-29 DIAGNOSIS — E1165 Type 2 diabetes mellitus with hyperglycemia: Secondary | ICD-10-CM | POA: Diagnosis present

## 2024-03-29 DIAGNOSIS — I10 Essential (primary) hypertension: Secondary | ICD-10-CM | POA: Diagnosis present

## 2024-03-29 DIAGNOSIS — F321 Major depressive disorder, single episode, moderate: Secondary | ICD-10-CM | POA: Diagnosis not present

## 2024-03-29 DIAGNOSIS — E440 Moderate protein-calorie malnutrition: Secondary | ICD-10-CM | POA: Diagnosis present

## 2024-03-29 DIAGNOSIS — Z794 Long term (current) use of insulin: Secondary | ICD-10-CM | POA: Diagnosis not present

## 2024-03-29 DIAGNOSIS — Z6841 Body Mass Index (BMI) 40.0 and over, adult: Secondary | ICD-10-CM | POA: Diagnosis not present

## 2024-03-29 DIAGNOSIS — R570 Cardiogenic shock: Secondary | ICD-10-CM | POA: Diagnosis present

## 2024-03-29 DIAGNOSIS — J9601 Acute respiratory failure with hypoxia: Secondary | ICD-10-CM | POA: Diagnosis present

## 2024-03-29 DIAGNOSIS — R569 Unspecified convulsions: Secondary | ICD-10-CM | POA: Diagnosis present

## 2024-03-29 DIAGNOSIS — E039 Hypothyroidism, unspecified: Secondary | ICD-10-CM | POA: Diagnosis present

## 2024-03-29 DIAGNOSIS — E785 Hyperlipidemia, unspecified: Secondary | ICD-10-CM | POA: Diagnosis not present

## 2024-03-29 DIAGNOSIS — E872 Acidosis, unspecified: Secondary | ICD-10-CM | POA: Diagnosis present

## 2024-03-29 DIAGNOSIS — E871 Hypo-osmolality and hyponatremia: Secondary | ICD-10-CM | POA: Diagnosis present

## 2024-03-29 DIAGNOSIS — F1010 Alcohol abuse, uncomplicated: Secondary | ICD-10-CM | POA: Diagnosis present

## 2024-03-29 DIAGNOSIS — F1090 Alcohol use, unspecified, uncomplicated: Secondary | ICD-10-CM | POA: Diagnosis not present

## 2024-03-29 DIAGNOSIS — E1143 Type 2 diabetes mellitus with diabetic autonomic (poly)neuropathy: Secondary | ICD-10-CM | POA: Diagnosis present

## 2024-03-29 DIAGNOSIS — E1169 Type 2 diabetes mellitus with other specified complication: Secondary | ICD-10-CM | POA: Diagnosis not present

## 2024-03-29 DIAGNOSIS — D649 Anemia, unspecified: Secondary | ICD-10-CM | POA: Diagnosis not present

## 2024-03-29 DIAGNOSIS — R739 Hyperglycemia, unspecified: Secondary | ICD-10-CM | POA: Diagnosis not present

## 2024-03-29 DIAGNOSIS — Z8709 Personal history of other diseases of the respiratory system: Secondary | ICD-10-CM | POA: Diagnosis not present

## 2024-03-29 DIAGNOSIS — G4733 Obstructive sleep apnea (adult) (pediatric): Secondary | ICD-10-CM | POA: Diagnosis not present

## 2024-03-29 DIAGNOSIS — Z7985 Long-term (current) use of injectable non-insulin antidiabetic drugs: Secondary | ICD-10-CM | POA: Diagnosis not present

## 2024-03-29 DIAGNOSIS — E669 Obesity, unspecified: Secondary | ICD-10-CM | POA: Diagnosis not present

## 2024-03-29 DIAGNOSIS — F411 Generalized anxiety disorder: Secondary | ICD-10-CM | POA: Diagnosis not present

## 2024-03-29 DIAGNOSIS — I4721 Torsades de pointes: Secondary | ICD-10-CM | POA: Diagnosis present

## 2024-03-29 DIAGNOSIS — J45909 Unspecified asthma, uncomplicated: Secondary | ICD-10-CM | POA: Diagnosis present

## 2024-03-29 DIAGNOSIS — F339 Major depressive disorder, recurrent, unspecified: Secondary | ICD-10-CM | POA: Diagnosis not present

## 2024-03-29 DIAGNOSIS — I4729 Other ventricular tachycardia: Secondary | ICD-10-CM | POA: Diagnosis present

## 2024-03-29 DIAGNOSIS — F319 Bipolar disorder, unspecified: Secondary | ICD-10-CM | POA: Diagnosis present

## 2024-03-29 DIAGNOSIS — I499 Cardiac arrhythmia, unspecified: Secondary | ICD-10-CM | POA: Diagnosis not present

## 2024-03-29 DIAGNOSIS — B20 Human immunodeficiency virus [HIV] disease: Secondary | ICD-10-CM | POA: Diagnosis present

## 2024-03-29 DIAGNOSIS — F419 Anxiety disorder, unspecified: Secondary | ICD-10-CM | POA: Diagnosis not present

## 2024-03-29 DIAGNOSIS — F909 Attention-deficit hyperactivity disorder, unspecified type: Secondary | ICD-10-CM | POA: Diagnosis not present

## 2024-03-29 DIAGNOSIS — E878 Other disorders of electrolyte and fluid balance, not elsewhere classified: Secondary | ICD-10-CM | POA: Diagnosis present

## 2024-03-29 HISTORY — PX: TEMPORARY PACEMAKER: CATH118268

## 2024-03-29 LAB — HEPATIC FUNCTION PANEL
ALT: 22 U/L (ref 0–44)
AST: 42 U/L — ABNORMAL HIGH (ref 15–41)
Albumin: 2.5 g/dL — ABNORMAL LOW (ref 3.5–5.0)
Alkaline Phosphatase: 92 U/L (ref 38–126)
Bilirubin, Direct: 0.4 mg/dL — ABNORMAL HIGH (ref 0.0–0.2)
Indirect Bilirubin: 0.8 mg/dL (ref 0.3–0.9)
Total Bilirubin: 1.2 mg/dL (ref 0.0–1.2)
Total Protein: 5.7 g/dL — ABNORMAL LOW (ref 6.5–8.1)

## 2024-03-29 LAB — MRSA NEXT GEN BY PCR, NASAL: MRSA by PCR Next Gen: DETECTED — AB

## 2024-03-29 LAB — POCT I-STAT 7, (LYTES, BLD GAS, ICA,H+H)
Acid-Base Excess: 1 mmol/L (ref 0.0–2.0)
Bicarbonate: 26.6 mmol/L (ref 20.0–28.0)
Calcium, Ion: 1.09 mmol/L — ABNORMAL LOW (ref 1.15–1.40)
HCT: 38 % (ref 36.0–46.0)
Hemoglobin: 12.9 g/dL (ref 12.0–15.0)
O2 Saturation: 99 %
Potassium: 2.6 mmol/L — CL (ref 3.5–5.1)
Sodium: 135 mmol/L (ref 135–145)
TCO2: 28 mmol/L (ref 22–32)
pCO2 arterial: 46.7 mmHg (ref 32–48)
pH, Arterial: 7.363 (ref 7.35–7.45)
pO2, Arterial: 168 mmHg — ABNORMAL HIGH (ref 83–108)

## 2024-03-29 LAB — PROTIME-INR
INR: 1.7 — ABNORMAL HIGH (ref 0.8–1.2)
Prothrombin Time: 20.7 s — ABNORMAL HIGH (ref 11.4–15.2)

## 2024-03-29 LAB — RENAL FUNCTION PANEL
Albumin: 2.8 g/dL — ABNORMAL LOW (ref 3.5–5.0)
Anion gap: 9 (ref 5–15)
BUN: <5 mg/dL — ABNORMAL LOW (ref 6–20)
CO2: 25 mmol/L (ref 22–32)
Calcium: 7.8 mg/dL — ABNORMAL LOW (ref 8.9–10.3)
Chloride: 101 mmol/L (ref 98–111)
Creatinine, Ser: 0.51 mg/dL (ref 0.44–1.00)
GFR, Estimated: >60 mL/min (ref >60)
Glucose, Bld: 102 mg/dL — ABNORMAL HIGH (ref 70–99)
Phosphorus: 3.5 mg/dL (ref 2.5–4.6)
Potassium: 4 mmol/L (ref 3.5–5.1)
Sodium: 135 mmol/L (ref 135–145)

## 2024-03-29 LAB — LACTIC ACID, PLASMA: Lactic Acid, Venous: 2.6 mmol/L (ref 0.5–1.9)

## 2024-03-29 LAB — CBC
HCT: 40.9 % (ref 36.0–46.0)
Hemoglobin: 13.6 g/dL (ref 12.0–15.0)
MCH: 33.3 pg (ref 26.0–34.0)
MCHC: 33.3 g/dL (ref 30.0–36.0)
MCV: 100 fL (ref 80.0–100.0)
Platelets: 279 K/uL (ref 150–400)
RBC: 4.09 MIL/uL (ref 3.87–5.11)
RDW: 13.2 % (ref 11.5–15.5)
WBC: 13.3 K/uL — ABNORMAL HIGH (ref 4.0–10.5)
nRBC: 0.2 % (ref 0.0–0.2)

## 2024-03-29 LAB — MAGNESIUM
Magnesium: 0.7 mg/dL — CL (ref 1.7–2.4)
Magnesium: 2.8 mg/dL — ABNORMAL HIGH (ref 1.7–2.4)
Magnesium: 2.4 mg/dL (ref 1.7–2.4)

## 2024-03-29 LAB — COMPREHENSIVE METABOLIC PANEL WITH GFR
ALT: 25 U/L (ref 0–44)
AST: 39 U/L (ref 15–41)
Albumin: 3.1 g/dL — ABNORMAL LOW (ref 3.5–5.0)
Alkaline Phosphatase: 118 U/L (ref 38–126)
Anion gap: 17 — ABNORMAL HIGH (ref 5–15)
BUN: <5 mg/dL — ABNORMAL LOW (ref 6–20)
CO2: 27 mmol/L (ref 22–32)
Calcium: 8.5 mg/dL — ABNORMAL LOW (ref 8.9–10.3)
Chloride: 95 mmol/L — ABNORMAL LOW (ref 98–111)
Creatinine, Ser: 0.64 mg/dL (ref 0.44–1.00)
GFR, Estimated: >60 mL/min (ref >60)
Glucose, Bld: 129 mg/dL — ABNORMAL HIGH (ref 70–99)
Potassium: 2.9 mmol/L — ABNORMAL LOW (ref 3.5–5.1)
Sodium: 139 mmol/L (ref 135–145)
Total Bilirubin: 1.7 mg/dL — ABNORMAL HIGH (ref 0.0–1.2)
Total Protein: 6.9 g/dL (ref 6.5–8.1)

## 2024-03-29 LAB — BASIC METABOLIC PANEL WITH GFR
Anion gap: 18 — ABNORMAL HIGH (ref 5–15)
BUN: <5 mg/dL — ABNORMAL LOW (ref 6–20)
CO2: 17 mmol/L — ABNORMAL LOW (ref 22–32)
Calcium: 7.1 mg/dL — ABNORMAL LOW (ref 8.9–10.3)
Chloride: 103 mmol/L (ref 98–111)
Creatinine, Ser: 0.55 mg/dL (ref 0.44–1.00)
GFR, Estimated: >60 mL/min (ref >60)
Glucose, Bld: 106 mg/dL — ABNORMAL HIGH (ref 70–99)
Potassium: 2.4 mmol/L — CL (ref 3.5–5.1)
Sodium: 138 mmol/L (ref 135–145)

## 2024-03-29 LAB — GLUCOSE, CAPILLARY
Glucose-Capillary: 102 mg/dL — ABNORMAL HIGH (ref 70–99)
Glucose-Capillary: 107 mg/dL — ABNORMAL HIGH (ref 70–99)
Glucose-Capillary: 111 mg/dL — ABNORMAL HIGH (ref 70–99)
Glucose-Capillary: 121 mg/dL — ABNORMAL HIGH (ref 70–99)
Glucose-Capillary: 122 mg/dL — ABNORMAL HIGH (ref 70–99)
Glucose-Capillary: 128 mg/dL — ABNORMAL HIGH (ref 70–99)

## 2024-03-29 LAB — CG4 I-STAT (LACTIC ACID)
Lactic Acid, Venous: 1.4 mmol/L (ref 0.5–1.9)
Lactic Acid, Venous: 1.1 mmol/L (ref 0.5–1.9)

## 2024-03-29 LAB — TROPONIN I (HIGH SENSITIVITY)
Troponin I (High Sensitivity): 3 ng/L (ref <18)
Troponin I (High Sensitivity): 15 ng/L (ref <18)

## 2024-03-29 LAB — PHOSPHORUS: Phosphorus: 2.4 mg/dL — ABNORMAL LOW (ref 2.5–4.6)

## 2024-03-29 LAB — TYPE AND SCREEN
ABO/RH(D): A POS
Antibody Screen: NEGATIVE

## 2024-03-29 LAB — I-STAT CG4 LACTIC ACID, ED: Lactic Acid, Venous: 7.9 mmol/L (ref 0.5–1.9)

## 2024-03-29 LAB — LIPASE, BLOOD: Lipase: 24 U/L (ref 11–51)

## 2024-03-29 SURGERY — TEMPORARY PACEMAKER
Anesthesia: LOCAL

## 2024-03-29 MED ORDER — POTASSIUM CHLORIDE 10 MEQ/100ML IV SOLN
10.0000 meq | INTRAVENOUS | Status: AC
  Administered 2024-03-29 (×2): 10 meq via INTRAVENOUS
  Filled 2024-03-29 (×2): qty 100

## 2024-03-29 MED ORDER — FAMOTIDINE 20 MG PO TABS: 20.0000 mg | ORAL_TABLET | Freq: Two times a day (BID) | ORAL | Status: DC

## 2024-03-29 MED ORDER — LORAZEPAM 2 MG/ML IJ SOLN
INTRAMUSCULAR | Status: AC
  Administered 2024-03-29: 2 mg
  Filled 2024-03-29: qty 1

## 2024-03-29 MED ORDER — ASPIRIN 81 MG PO CHEW
81.0000 mg | CHEWABLE_TABLET | Freq: Every day | ORAL | Status: DC
  Filled 2024-03-29: qty 1

## 2024-03-29 MED ORDER — DOLUTEGRAVIR-LAMIVUDINE 50-300 MG PO TABS
1.0000 | ORAL_TABLET | Freq: Every evening | ORAL | Status: DC
  Administered 2024-03-29 – 2024-04-01 (×4): 1 via ORAL
  Filled 2024-03-29 (×5): qty 1

## 2024-03-29 MED ORDER — FENTANYL BOLUS VIA INFUSION: 100.0000 ug | INTRAVENOUS | Status: DC | PRN

## 2024-03-29 MED ORDER — INSULIN ASPART 100 UNIT/ML IJ SOLN: 0.0000 [IU] | Freq: Three times a day (TID) | INTRAMUSCULAR | Status: DC

## 2024-03-29 MED ORDER — FAMOTIDINE 20 MG PO TABS
20.0000 mg | ORAL_TABLET | Freq: Two times a day (BID) | ORAL | Status: DC
  Administered 2024-03-29: 20 mg
  Filled 2024-03-29 (×2): qty 1

## 2024-03-29 MED ORDER — POTASSIUM CHLORIDE 20 MEQ PO PACK
40.0000 meq | PACK | Freq: Every day | ORAL | Status: DC
  Filled 2024-03-29 (×2): qty 2

## 2024-03-29 MED ORDER — MAGNESIUM SULFATE 2 GM/50ML IV SOLN
2.0000 g | Freq: Once | INTRAVENOUS | Status: AC
  Administered 2024-03-29: 2 g via INTRAVENOUS
  Filled 2024-03-29: qty 50

## 2024-03-29 MED ORDER — INSULIN ASPART 100 UNIT/ML IJ SOLN
0.0000 [IU] | INTRAMUSCULAR | Status: DC
  Administered 2024-03-29 – 2024-03-31 (×4): 2 [IU] via SUBCUTANEOUS
  Filled 2024-03-29 (×4): qty 2

## 2024-03-29 MED ORDER — FENTANYL CITRATE (PF) 50 MCG/ML IJ SOSY
PREFILLED_SYRINGE | INTRAMUSCULAR | Status: DC
  Filled 2024-03-29: qty 2

## 2024-03-29 MED ORDER — ORAL CARE MOUTH RINSE: 15.0000 mL | OROMUCOSAL | Status: DC | PRN

## 2024-03-29 MED ORDER — FENTANYL 2500MCG IN NS 250ML (10MCG/ML) PREMIX INFUSION
0.0000 ug/h | INTRAVENOUS | Status: DC
  Administered 2024-03-29: 25 ug/h via INTRAVENOUS
  Filled 2024-03-29: qty 250

## 2024-03-29 MED ORDER — POTASSIUM CHLORIDE 10 MEQ/100ML IV SOLN
10.0000 meq | INTRAVENOUS | Status: AC
  Administered 2024-03-29 (×4): 10 meq via INTRAVENOUS
  Filled 2024-03-29 (×4): qty 100

## 2024-03-29 MED ORDER — FOLIC ACID 1 MG PO TABS
1.0000 mg | ORAL_TABLET | Freq: Every day | ORAL | Status: DC
  Administered 2024-03-30 – 2024-04-01 (×3): 1 mg via ORAL
  Filled 2024-03-29 (×4): qty 1

## 2024-03-29 MED ORDER — PANTOPRAZOLE SODIUM 40 MG IV SOLR
40.0000 mg | Freq: Every day | INTRAVENOUS | Status: DC
  Administered 2024-03-29 – 2024-03-31 (×3): 40 mg via INTRAVENOUS
  Filled 2024-03-29 (×3): qty 10

## 2024-03-29 MED ORDER — FENTANYL 2500MCG IN NS 250ML (10MCG/ML) PREMIX INFUSION
INTRAVENOUS | Status: AC
  Filled 2024-03-29: qty 250

## 2024-03-29 MED ORDER — MIDAZOLAM BOLUS VIA INFUSION: 4.0000 mg | INTRAVENOUS | Status: DC | PRN

## 2024-03-29 MED ORDER — ATORVASTATIN CALCIUM 80 MG PO TABS
80.0000 mg | ORAL_TABLET | Freq: Every day | ORAL | Status: DC
  Filled 2024-03-29: qty 1

## 2024-03-29 MED ORDER — ROCURONIUM BROMIDE 10 MG/ML (PF) SYRINGE
PREFILLED_SYRINGE | INTRAVENOUS | Status: AC
  Administered 2024-03-29: 100 mg
  Filled 2024-03-29: qty 10

## 2024-03-29 MED ORDER — MIDAZOLAM HCL 2 MG/2ML IJ SOLN
INTRAMUSCULAR | Status: DC
  Filled 2024-03-29: qty 2

## 2024-03-29 MED ORDER — ATORVASTATIN CALCIUM 80 MG PO TABS
80.0000 mg | ORAL_TABLET | Freq: Every day | ORAL | Status: DC
  Administered 2024-03-29: 80 mg
  Filled 2024-03-29: qty 1

## 2024-03-29 MED ORDER — KETAMINE HCL 50 MG/5ML IJ SOSY
PREFILLED_SYRINGE | INTRAMUSCULAR | Status: DC
  Filled 2024-03-29: qty 10

## 2024-03-29 MED ORDER — POTASSIUM CHLORIDE 20 MEQ PO PACK
40.0000 meq | PACK | Freq: Every day | ORAL | Status: DC
  Administered 2024-03-29: 40 meq
  Filled 2024-03-29 (×2): qty 2

## 2024-03-29 MED ORDER — CHLORHEXIDINE GLUCONATE CLOTH 2 % EX PADS
6.0000 | MEDICATED_PAD | Freq: Every day | CUTANEOUS | Status: DC
  Administered 2024-03-29 – 2024-04-01 (×4): 6 via TOPICAL

## 2024-03-29 MED ORDER — DOCUSATE SODIUM 100 MG PO CAPS: 100.0000 mg | ORAL_CAPSULE | Freq: Two times a day (BID) | ORAL | Status: DC | PRN

## 2024-03-29 MED ORDER — INSULIN ASPART 100 UNIT/ML IJ SOLN: 0.0000 [IU] | Freq: Every day | INTRAMUSCULAR | Status: DC

## 2024-03-29 MED ORDER — ASPIRIN 81 MG PO CHEW
81.0000 mg | CHEWABLE_TABLET | Freq: Every day | ORAL | Status: DC
  Administered 2024-03-29: 81 mg
  Filled 2024-03-29: qty 1

## 2024-03-29 MED ORDER — LACTATED RINGERS IV SOLN: INTRAVENOUS | Status: DC

## 2024-03-29 MED ORDER — SUCCINYLCHOLINE CHLORIDE 200 MG/10ML IV SOSY
PREFILLED_SYRINGE | INTRAVENOUS | Status: DC
  Filled 2024-03-29: qty 10

## 2024-03-29 MED ORDER — ETOMIDATE 2 MG/ML IV SOLN
INTRAVENOUS | Status: AC
  Administered 2024-03-29: 20 mg
  Filled 2024-03-29: qty 20

## 2024-03-29 MED ORDER — MAGNESIUM SULFATE 2 GM/50ML IV SOLN
INTRAVENOUS | Status: AC
  Administered 2024-03-29: 2 g
  Filled 2024-03-29: qty 50

## 2024-03-29 MED ORDER — POTASSIUM CHLORIDE 20 MEQ PO PACK
40.0000 meq | PACK | Freq: Once | ORAL | Status: AC
  Administered 2024-03-29: 40 meq
  Filled 2024-03-29: qty 2

## 2024-03-29 MED ORDER — LORAZEPAM 2 MG/ML IJ SOLN
2.0000 mg | Freq: Once | INTRAMUSCULAR | Status: AC
  Administered 2024-03-29: 2 mg via INTRAVENOUS
  Filled 2024-03-29: qty 1

## 2024-03-29 MED ORDER — LIDOCAINE HCL (PF) 1 % IJ SOLN
INTRAMUSCULAR | Status: DC | PRN
  Administered 2024-03-29: 20 mL via INTRADERMAL

## 2024-03-29 MED ORDER — ORAL CARE MOUTH RINSE
15.0000 mL | OROMUCOSAL | Status: DC
  Administered 2024-03-29 (×8): 15 mL via OROMUCOSAL

## 2024-03-29 MED ORDER — MUPIROCIN 2 % EX OINT
1.0000 | TOPICAL_OINTMENT | Freq: Two times a day (BID) | CUTANEOUS | Status: DC
  Administered 2024-03-29 – 2024-04-01 (×7): 1 via NASAL
  Filled 2024-03-29 (×2): qty 22

## 2024-03-29 MED ORDER — POLYETHYLENE GLYCOL 3350 17 G PO PACK: 17.0000 g | PACK | Freq: Every day | ORAL | Status: DC | PRN

## 2024-03-29 MED ORDER — ETOMIDATE 2 MG/ML IV SOLN
INTRAVENOUS | Status: AC
  Filled 2024-03-29: qty 10

## 2024-03-29 MED ORDER — MIDAZOLAM-SODIUM CHLORIDE 100-0.9 MG/100ML-% IV SOLN
INTRAVENOUS | Status: AC
  Filled 2024-03-29: qty 100

## 2024-03-29 MED ORDER — LIDOCAINE HCL (PF) 1 % IJ SOLN
INTRAMUSCULAR | Status: AC
  Filled 2024-03-29: qty 30

## 2024-03-29 MED ORDER — MIDAZOLAM-SODIUM CHLORIDE 100-0.9 MG/100ML-% IV SOLN
0.5000 mg/h | INTRAVENOUS | Status: DC
  Administered 2024-03-29: 8.5 mg/h via INTRAVENOUS
  Administered 2024-03-29: 0.5 mg/h via INTRAVENOUS
  Filled 2024-03-29: qty 100

## 2024-03-29 SURGICAL SUPPLY — 9 items
CABLE ADAPT PACING TEMP 12FT (ADAPTER) IMPLANT
CATH S G BIP PACING (CATHETERS) IMPLANT
PACK CARDIAC CATHETERIZATION (CUSTOM PROCEDURE TRAY) ×1 IMPLANT
SHEATH INTRO PINNACLE 6F 25CM (SHEATH) IMPLANT
SHEATH PROBE COVER 6X72 (BAG) IMPLANT
SHIELD CATH-GARD CONTAMINATION (MISCELLANEOUS) IMPLANT
STATION PROTECTION PRESSURIZED (MISCELLANEOUS) IMPLANT
TUBING CIL FLEX 10 FLL-RA (TUBING) IMPLANT
WIRE EMERALD 3MM-J .035X150CM (WIRE) IMPLANT

## 2024-03-29 NOTE — Progress Notes (Signed)
 Patient placed back on CPAP 5 / PS 5 40% wean as sedation is weaned. Patient tolerating well at this time.

## 2024-03-29 NOTE — ED Notes (Addendum)
 Patient intubated, 21 at the teeth

## 2024-03-29 NOTE — ED Notes (Addendum)
 Debarah, MD verbally ordered for 100 bolus of lidocaine  from bag

## 2024-03-29 NOTE — Progress Notes (Signed)
 Pt transported from ED to Cath lab without complications.

## 2024-03-29 NOTE — H&P (Addendum)
 Cardiology Admission History and Physical   Patient ID: Fleurette Woolbright MRN: [id number]; DOB: 27-Jan-1981   Admission date: 03/28/2024  PCP:  Eben Reyes BROCKS, MD   [facility] Providers Cardiologist:  None    Chief Complaint:  chest pain and shortness of breath   Patient Profile: Lindsay French is a 43 y.o. female with morbid obesity, nocturnal hypoxia on HOT (?OSA), DM-2, HTN, dyslipidemia, hypomagnesemia, HIV, GERD, depression/anxiety, and prolonged QT c/v TdP in in late 02/2024 due to hypok and hypoMg who presented to ED for chest pain and shortness of breath. Soon after the was hooked up to telemetry, she was found to have runs of NSVT mostly PM, bigeminy and early signs of potential TdP conversion. Cardiology was consulted. She was given IV Mag and started on IV Lidocaine  gtt at 1 and pushes of ativan . She was initially responding to ativan  pushes, but kept breaking through into PMVT. Given impending clinical instability, we proceeded with intubation, sedation and cath lab activation for TVP.    Past Medical History:  Diagnosis Date   ADHD    Anxiety    Asthma    usually with colds   Avascular necrosis (HCC)    Back pain    Depression    Diabetes mellitus without complication (HCC)    Elevated LFTs    Fatty liver 04/16/2016   Noted US  ABD   GERD (gastroesophageal reflux disease)    Heart murmur    at birth, no issues   History of heat stroke    with syncope   History of thrombocytopenia    prior to diagnosis HIV   HIV (human immunodeficiency virus infection) (HCC)    Hyperlipidemia    Hypertension    Hypothyroidism    Infertility, female    Obese    PCOS (polycystic ovarian syndrome)    Pneumonia 04/17/2016   SOB (shortness of breath)    Wears partial dentures    upper   Wrist fracture 11/2017   Left   Past Surgical History:  Procedure Laterality Date   BIOPSY  06/22/2023   Procedure: BIOPSY;  Surgeon: Shila Gustav GAILS, MD;  Location: [facility];   Service: Gastroenterology;;   ESOPHAGOGASTRODUODENOSCOPY (EGD) WITH PROPOFOL  N/A 06/22/2023   Procedure: ESOPHAGOGASTRODUODENOSCOPY (EGD) WITH PROPOFOL ;  Surgeon: Shila Gustav GAILS, MD;  Location: [facility];  Service: Gastroenterology;  Laterality: N/A;   HYSTEROSCOPY N/A 12/31/2017   Procedure: HYSTEROSCOPY and polypectomy;  Surgeon: Yalcinkaya, Tamer, MD;  Location: [facility];  Service: Gynecology;  Laterality: N/A;   I & D EXTREMITY Left 07/15/2020   Procedure: PARTIAL EXCISION LEFT TIBIA;  Surgeon: Harden Jerona GAILS, MD;  Location: [facility] OR;  Service: Orthopedics;  Laterality: Left;   IR FLUORO GUIDE CV LINE LEFT  09/26/2020   IR RADIOLOGIST EVAL & MGMT  08/12/2020   maxillofacial surgery     OPEN REDUCTION INTERNAL FIXATION (ORIF) FOOT LISFRANC FRACTURE Left 06/11/2023   Procedure: OPEN TREAMENT OF LEFT FIRST, SECOND, FOURTH METATARSALS, MEDIAL CUNEIFORM FRACTURE, CUBOID, FIRST SECOND THRID TARSOMETATARSAL AND LISFRANC JOINT;  Surgeon: Elsa Lonni SAUNDERS, MD;  Location: [facility] OR;  Service: Orthopedics;  Laterality: Left;   RIGHT/LEFT HEART CATH AND CORONARY ANGIOGRAPHY N/A 03/19/2024   Procedure: RIGHT/LEFT HEART CATH AND CORONARY ANGIOGRAPHY;  Surgeon: Anner Alm ORN, MD;  Location: [facility] INVASIVE CV LAB;  Service: Cardiovascular;  Laterality: N/A;   TEMPORARY PACEMAKER N/A 03/19/2024   Procedure: TEMPORARY PACEMAKER;  Surgeon: Anner Alm ORN, MD;  Location: [facility] INVASIVE CV LAB;  Service: Cardiovascular;  Laterality: N/A;     Medications Prior to Admission: Prior to Admission medications   Medication Sig Start Date End Date Taking? Authorizing Provider  albuterol  (VENTOLIN  HFA) 108 (90 Base) MCG/ACT inhaler INHALE 1 PUFF INTO THE LUNGS EVERY 6 HOURS AS NEEDED FOR WHEEZING OR SHORTNESS OF BREATH 06/05/22   Eben Reyes BROCKS, MD  ALPRAZolam  (XANAX ) 1 MG tablet Take 1 tablet (1 mg total) by mouth 3 (three) times daily as needed for anxiety. 08/28/23   Eben Reyes BROCKS, MD  amLODipine   (NORVASC ) 5 MG tablet Take 1 tablet (5 mg total) by mouth daily. Follow with your PCP for refills 03/24/24 04/23/24  Perri DELENA Meliton Mickey., MD  aspirin  81 MG chewable tablet Chew 1 tablet (81 mg total) by mouth daily. 03/24/24 06/22/24  Perri DELENA Meliton Mickey., MD  atorvastatin  (LIPITOR ) 80 MG tablet Take 1 tablet (80 mg total) by mouth daily. 03/24/24 06/22/24  Perri DELENA Meliton Mickey., MD  Blood Glucose Monitoring Suppl (ACCU-CHEK GUIDE ME) w/Device KIT USE TO TEST UP TO 4 TIMES DAILY AS DIRECTED 02/26/24   [provider]  Blood Glucose Monitoring Suppl DEVI 1 each by Does not apply route as directed. Dispense based on patient and insurance preference. Use up to four times daily as directed. (FOR ICD-10 E10.9, E11.9). 02/26/24   Eben Reyes BROCKS, MD  budesonide -formoterol  (SYMBICORT ) 80-4.5 MCG/ACT inhaler Inhale 2 puffs into the lungs daily. Patient taking differently: Inhale 2 puffs into the lungs daily as needed (when sick). 06/12/22   Eben Reyes BROCKS, MD  dolutegravir -lamiVUDine  ([name] ) 50-300 MG tablet Take 1 tablet by mouth every evening. 08/20/23   Eben Reyes BROCKS, MD  folic acid  (FOLVITE ) 1 MG tablet Take 1 tablet (1 mg total) by mouth daily. Patient not taking: Reported on 03/20/2024 06/23/23   Masters, Izetta, DO  Glucose Blood (BLOOD GLUCOSE TEST STRIPS) STRP 1 each by Does not apply route as directed. Dispense based on patient and insurance preference. Use up to four times daily as directed. (FOR ICD-10 E10.9, E11.9). 02/26/24   Eben Reyes BROCKS, MD  IMODIUM A-D 2 MG capsule Take 2 mg by mouth as needed for diarrhea or loose stools.    [provider]  Iron , Ferrous Sulfate , 325 (65 Fe) MG TABS Take 1 tablet by mouth daily. Patient taking differently: Take 325 mg by mouth daily after supper. 07/30/23 03/20/24  Eben Reyes BROCKS, MD  Lancet Device MISC 1 each by Does not apply route as directed. Dispense based on patient and insurance preference. Use up to four times daily as  directed. (FOR ICD-10 E10.9, E11.9). 02/26/24   Eben Reyes BROCKS, MD  Lancets MISC 1 each by Does not apply route as directed. Dispense based on patient and insurance preference. Use up to four times daily as directed. (FOR ICD-10 E10.9, E11.9). 02/26/24   Eben Reyes BROCKS, MD  Magnesium  200 MG TABS Take 2 tablets (400 mg total) by mouth daily. 01/11/24   Regalado, Belkys A, MD  MOUNJARO  2.5 MG/0.5ML Pen Inject 2.5 mg into the skin once a week. 03/14/24   [provider]  naloxone (NARCAN) nasal spray 4 mg/0.1 mL Place 1 spray into the nose once as needed (AS DIRECTED FOR A CRISIS). 09/03/23   [provider]  NEOMYCIN -POLYMYXIN-HYDROCORTISONE (CORTISPORIN ) 1 % SOLN OTIC solution Place 3 drops into the left ear every 6 (six) hours for 7 days. 03/23/24 03/30/24  Perri DELENA Meliton Mickey., MD  pantoprazole  (PROTONIX ) 40 MG tablet Take 1  tablet (40 mg total) by mouth daily. Patient taking differently: Take 40 mg by mouth at bedtime. 08/19/23   Eben Reyes BROCKS, MD  sertraline  (ZOLOFT ) 50 MG tablet Take 1 tablet (50 mg total) by mouth at bedtime. Follow with your psychiatrist for refills.  You should probably get an EKG to evaluate for QT prolongation. 03/23/24 04/22/24  Perri DELENA Meliton Mickey., MD     Allergies:    Allergies  Allergen Reactions   Citalopram Hydrobromide Other (See Comments)    Felt weird 04/2021 Admission for pVT/torsades 03/2024    Lithium  Other (See Comments)    Required hospitalization for VT/torsades 03/2024 > would not restart     Cephalexin  Other (See Comments)    Burning face   Fluconazole  Other (See Comments)    Required hospitalization for VT/torsades > would not restart     Hydrocodone -Acetaminophen  Hives and Itching   Latex Other (See Comments)    As a teenager, when using latex condoms, she would get a yeast infection.   Mucinex  Fast-Max Chest Cong Ms [Guaifenesin ] Nausea And Vomiting   Ancef  [Cefazolin ] Rash   Lisinopril Cough    Social  History:   Social History   Socioeconomic History   Marital status: Married    Spouse name: Not on file   Number of children: Not on file   Years of education: Not on file   Highest education level: Not on file  Occupational History   Occupation: Daycare Owner  Tobacco Use   Smoking status: Former    Current packs/day: 0.30    Average packs/day: 0.3 packs/day for 15.0 years (4.5 ttl pk-yrs)    Types: Cigarettes   Smokeless tobacco: Never  Vaping Use   Vaping status: Never Used  Substance and Sexual Activity   Alcohol use: Yes    Alcohol/week: 7.0 standard drinks of alcohol    Types: 7 Glasses of wine per week    Comment: wine  2-3 times a week   Drug use: Not Currently    Types: Crack cocaine    Comment: Previous hx of crack use - clean for 7 years    Sexual activity: Not Currently    Partners: Male    Birth control/protection: None    Comment: try to conceive   Other Topics Concern   Not on file  Social History Narrative   ** Merged History Encounter **       Social Drivers of Health   Financial Resource Strain: Low Risk  (07/31/2022)   Overall Financial Resource Strain (CARDIA)    Difficulty of Paying Living Expenses: Not hard at all  Food Insecurity: No Food Insecurity (03/26/2024)   Hunger Vital Sign    Worried About Running Out of Food in the Last Year: Never true    Ran Out of Food in the Last Year: Never true  Transportation Needs: No Transportation Needs (03/26/2024)   PRAPARE - Administrator, Civil Service (Medical): No    Lack of Transportation (Non-Medical): No  Physical Activity: Sufficiently Active (07/31/2022)   Exercise Vital Sign    Days of Exercise per Week: 7 days    Minutes of Exercise per Session: 30 min  Stress: No Stress Concern Present (07/31/2022)   [employer] of Occupational Health - Occupational Stress Questionnaire    Feeling of Stress : Not at all  Social Connections: Moderately Integrated (07/31/2022)   Social  Connection and Isolation Panel    Frequency of Communication with Friends and Family: More  than three times a week    Frequency of Social Gatherings with Friends and Family: Twice a week    Attends Religious Services: Never    Database Administrator or Organizations: Yes    Attends Banker Meetings: Never    Marital Status: Married  Recent Concern: Social Connections - Moderately Isolated (06/12/2022)   Social Connection and Isolation Panel    Frequency of Communication with Friends and Family: More than three times a week    Frequency of Social Gatherings with Friends and Family: More than three times a week    Attends Religious Services: Never    Database Administrator or Organizations: No    Attends Engineer, Structural: Not on file    Marital Status: Married  Catering Manager Violence: Not At Risk (03/26/2024)   Humiliation, Afraid, Rape, and Kick questionnaire    Fear of Current or Ex-Partner: No    Emotionally Abused: No    Physically Abused: No    Sexually Abused: No     Family History:  [name] family history includes Depression in her father and mother; Hyperlipidemia in her father; Hypertension in her father; Hypothyroidism in her mother; Obesity in her father and mother; Thyroid disease in her mother.    ROS:  Please see the history of present illness.  All other ROS reviewed and negative.     Physical Exam/Data: Vitals:   03/28/24 2309 03/28/24 2315 03/28/24 2330  BP:  128/67 104/86  Pulse:  93 69  Resp:  (!) 9 17  Temp: 98.1 F (36.7 C)    TempSrc: Oral    SpO2:  100% 100%  Weight: 110.6 kg    Height: 5' 2.5 (1.588 m)     No intake or output data in the 24 hours ending 03/29/24 0001    03/28/2024   11:09 PM 03/23/2024    4:16 AM 03/22/2024    5:00 AM  Last 3 Weights  Weight (lbs) 243 lb 12.8 oz 243 lb 12.8 oz 245 lb 6 oz  Weight (kg) 110.587 kg 110.587 kg 111.3 kg     Body mass index is 43.88 kg/m.  General:  Well nourished,  well developed, in acute distress HEENT: normal Neck: no JVD Vascular: No carotid bruits; Distal pulses 2+ bilaterally   Cardiac:  normal S1, S2; RRR; no murmur  Lungs:  clear to auscultation bilaterally, no wheezing, rhonchi or rales  Abd: soft, nontender, no hepatomegaly  Ext: no edema Musculoskeletal:  No deformities, BUE and BLE strength normal and equal Skin: warm and dry  Neuro:  CNs 2-12 intact, no focal abnormalities noted Psych:  Normal affect   EKG:     Relevant CV Studies: LHC/RHC 03/19/2024: FINDINGS  Incessant Recurrent Polymorphic Ventricular Tachycardia/Torsades upon required defibrillation x 1 otherwise with self terminating Angiographically Normal Coronary Arteries Mild Pulmonary Hypertension with mean PAP 29 mmHg and a PCWP of 19 mm/LVEDP of 16 mmHg. Hyperdynamic left ventricular function EF roughly 65 to 70%, Cardiac Output and Index also elevated. Fick 9.9-4.66; thermodilution 8.01-3.77 with AF sat 100% PA sat of 86% on ventilator   TTE 03/20/2024:  1. Left ventricular ejection fraction, by estimation, is 55%. The left  ventricle has normal function. The left ventricle has no regional wall  motion abnormalities. Left ventricular diastolic parameters are  indeterminate.   2. Right ventricular systolic function is moderately reduced. The right  ventricular size is mildly enlarged.   3. The mitral valve is normal in  structure. Trivial mitral valve  regurgitation. No evidence of mitral stenosis.   4. Tricuspid valve regurgitation is mild to moderate.   5. The aortic valve is tricuspid. Aortic valve regurgitation is not  visualized. No aortic stenosis is present.   Laboratory Data: High Sensitivity Troponin:   Recent Labs  Lab 03/19/24 1839 03/19/24 2315  TROPONINIHS 4 26*      Chemistry Recent Labs  Lab 03/22/24 0211 03/23/24 0628  NA 133* 134*  K 4.1 3.4*  CL 97* 97*  CO2 25 29  GLUCOSE 102* 85  BUN <5* <5*  CREATININE 0.53 0.50  CALCIUM  7.4*  7.8*  MG 2.1 1.9  GFRNONAA >60 >60  ANIONGAP 11 8    Recent Labs  Lab 03/22/24 0211 03/23/24 0628  PROT 6.0* 5.7*  ALBUMIN 2.9* 2.7*  AST 32 28  ALT 27 21  ALKPHOS 96 96  BILITOT 2.1* 1.3*   Lipids No results for input(s): CHOL, TRIG, HDL, LABVLDL, LDLCALC, CHOLHDL in the last 168 hours. Hematology Recent Labs  Lab 03/28/24 2324  WBC 8.4  RBC 3.64*  HGB 12.1  HCT 35.8*  MCV 98.4  MCH 33.2  MCHC 33.8  RDW 13.2  PLT 311   Thyroid No results for input(s): TSH, FREET4 in the last 168 hours. BNPNo results for input(s): BNP, PROBNP in the last 168 hours.  DDimer No results for input(s): DDIMER in the last 168 hours.  Radiology/Studies:  No results found.   Assessment and Plan: 41F with recurrent episode of TdP initially presented with dyspnea and chest pain in the setting of initially runs of polymorphic VT.  She was intubated and sedated in the ED and ultimately had femoral EP placed in the Cath Lab by Dr. Jordan.  Her initial lactate was 7.9.  The plan is to admit to cardiac ICU for further management.  #TdP - Status post TVP placement, threshold 0.3, output 5, goal HR > 100 overnight - Continue lidocaine  drip at 1 - Continue aggressive IV mag supplementation with goal mag greater than 2.5 - Ensure K greater than 4.5 - Keep intubated with RASS -3 to -4 overnight - Lung protective ventilation per respiratory therapy - Please order TTE in the morning - Wean sedation in the morning - Trend lactate - EP consult before discharge   Risk Assessment/Risk Scores:        Code Status: Full Code  Severity of Illness: The appropriate patient status for this patient is INPATIENT. Inpatient status is judged to be reasonable and necessary in order to provide the required intensity of service to ensure the patient's safety. [name] presenting symptoms, physical exam findings, and initial radiographic and laboratory data in the context of their  chronic comorbidities is felt to place them at high risk for further clinical deterioration. Furthermore, it is not anticipated that the patient will be medically stable for discharge from the hospital within 2 midnights of admission.   * I certify that at the point of admission it is my clinical judgment that the patient will require inpatient hospital care spanning beyond 2 midnights from the point of admission due to high intensity of service, high risk for further deterioration and high frequency of surveillance required.*  For questions or updates, please contact [facility] Please consult [email or web address] for contact info under     Signed, Donley LOISE Devonshire, MD  03/29/2024 12:01 AM

## 2024-03-29 NOTE — Progress Notes (Signed)
 Advanced Heart Failure Rounding Note  Cardiologist: None  Chief Complaint: Torsades Subjective:    Patient currently intubated but responsive on a heavy amount of sedation. No further events.    Objective:   Weight Range: 110.6 kg Body mass index is 43.89 kg/m.   Vital Signs:   Temp:  [97.8 F (36.6 C)-98.9 F (37.2 C)] 97.8 F (36.6 C) (12/07 1100) Pulse Rate:  [0-127] 100 (12/07 1300) Resp:  [9-25] 13 (12/07 1300) BP: (104-175)/(67-127) 137/88 (12/07 1300) SpO2:  [97 %-100 %] 98 % (12/07 1300) FiO2 (%):  [40 %-100 %] 40 % (12/07 1224) Weight:  [110.6 kg] 110.6 kg (12/07 0500) Last BM Date :  (PTA)  Weight change: Filed Weights   03/28/24 2309 03/29/24 0500  Weight: 110.6 kg 110.6 kg    Intake/Output:   Intake/Output Summary (Last 24 hours) at 03/29/2024 1315 Last data filed at 03/29/2024 1300 Gross per 24 hour  Intake 1604.39 ml  Output 540 ml  Net 1064.39 ml      Physical Exam    GENERAL: Intubated PULM:  Ventilated breath sounds CARDIAC:  JVP: flat         tachycardic rate with regular rhythm. No murmurs, rubs or gallops.  No edema. Warm and well perfused extremities. ABDOMEN: Soft, non-tender, non-distended. NEUROLOGIC: Sedated but responsive    Telemetry   Now sinus tachycardia in the 110s  Medications:     Scheduled Medications:  aspirin   81 mg Per Tube Daily   atorvastatin   80 mg Per Tube Daily   Chlorhexidine  Gluconate Cloth  6 each Topical Daily   dolutegravir -lamiVUDine   1 tablet Oral QPM   folic acid   1 mg Oral Daily   insulin  aspart  0-15 Units Subcutaneous Q4H   mupirocin  ointment  1 Application Nasal BID   mouth rinse  15 mL Mouth Rinse Q2H   pantoprazole  (PROTONIX ) IV  40 mg Intravenous QHS   potassium chloride   40 mEq Per Tube Daily    Infusions:  lidocaine  1 mg/min (03/28/24 2337)    PRN Medications: docusate sodium , mouth rinse, polyethylene glycol    Patient Profile   Brynlynn Walko is a 43 y.o. female with  morbid obesity, nocturnal hypoxia, DMII, HTN, dyslipidemia, HIV, GERD, depression/anxiety and prolonged QT (9/25 admitted with low K/Mg 2/2 ozempic ). Admitted N/V/D> polymorphic VT/Torsades.   Assessment/Plan    Polymorphic VT in setting of QT prolongation  - Recent admission for same (third time in the past year) - While on multiple Qtc agents last time, here appears to be more driven by massive electrolyte abnormalities - EP has seen previously, agree that no role for device given that her cause has been reversible - Prior normal cath - Lidocaine  at 1 - lido level pending - Temp pacer in place, but now in sinus tach - Avoid all Qtc prolonging drugs - Probably no monjauro in the future - Suspect alcohol use significantly contributing   Lactic acidosis:  - ?starvation acidosis versus due to initial shock - Resolving   3. Acute hypoxic respiratory failure due to #1 -Currently intubated, wean today  Medication concerns reviewed with patient and pharmacy team. Barriers identified: Electrolytes  Length of Stay: 0  Morene JINNY Brownie, MD  03/29/2024, 1:15 PM  Advanced Heart Failure Team Pager [phone] (M-F; 7a - 5p)  Please contact [facility] for night-coverage after hours (5p -7a ) and weekends on [email or web address]  CRITICAL CARE Performed by: Morene JINNY Brownie   Total critical care time: 22  minutes  Critical care time was exclusive of separately billable procedures and treating other patients.  Critical care was necessary to treat or prevent imminent or life-threatening deterioration.  Critical care was time spent personally by me on the following activities: development of treatment plan with patient and/or surrogate as well as nursing, discussions with consultants, evaluation of patient's response to treatment, examination of patient, obtaining history from patient or surrogate, ordering and performing treatments and interventions, ordering and review of laboratory studies,  ordering and review of radiographic studies, pulse oximetry and re-evaluation of patient's condition.

## 2024-03-29 NOTE — ED Notes (Addendum)
20mg  of etomidate administered

## 2024-03-29 NOTE — Plan of Care (Signed)
  Problem: Education: Goal: Knowledge of General Education information will improve Description: Including pain rating scale, medication(s)/side effects and non-pharmacologic comfort measures Outcome: Progressing   Problem: Health Behavior/Discharge Planning: Goal: Ability to manage health-related needs will improve Outcome: Progressing   Problem: Clinical Measurements: Goal: Ability to maintain clinical measurements within normal limits will improve Outcome: Progressing   Problem: Pain Managment: Goal: General experience of comfort will improve and/or be controlled Outcome: Progressing   Problem: Coping: Goal: Ability to adjust to condition or change in health will improve Outcome: Progressing

## 2024-03-29 NOTE — ED Notes (Signed)
 Lab results was given to Dr.Bero.

## 2024-03-29 NOTE — Consult Note (Addendum)
 NAME:  Lindsay French, MRN:  [id number], DOB:  [birth year], LOS: 0 ADMISSION DATE:  03/28/2024, CONSULTATION DATE: 03/30/2019 REFERRING MD:  Debarah Donley SAILOR, MD , CHIEF COMPLAINT: Chest pain and shortness of breath  History of Present Illness:  43 year old morbidly obese female with diabetes, hypertension, HIV on HARRT, cor pulmonale and recurrent VTE's in the setting of electrolyte abnormalities including hypokalemia and hypomagnesemia who presented overnight to ED with chest pain and shortness of breath on telemetry monitoring she was noted to have runs of polymorphic NSVT.  On workup she was noted to have magnesium  of 0.7, serum potassium of 2.4 and lactate of 7.9.  She was started on IV lidocaine  infusion and was given IV Ativan  with good decrease in mental status she was intubated and was admitted to ICU.  She is getting aggressive electrolyte replacement and currently on lidocaine  infusion at 1 mg PCCM was consulted for help evaluation medical management  Pertinent  Medical History   Past Medical History:  Diagnosis Date   ADHD    Anxiety    Asthma    usually with colds   Avascular necrosis (HCC)    Back pain    Depression    Diabetes mellitus without complication (HCC)    Elevated LFTs    Fatty liver 04/16/2016   Noted US  ABD   GERD (gastroesophageal reflux disease)    Heart murmur    at birth, no issues   History of heat stroke    with syncope   History of thrombocytopenia    prior to diagnosis HIV   HIV (human immunodeficiency virus infection) (HCC)    Hyperlipidemia    Hypertension    Hypothyroidism    Infertility, female    Obese    PCOS (polycystic ovarian syndrome)    Pneumonia 04/17/2016   SOB (shortness of breath)    Wears partial dentures    upper   Wrist fracture 11/2017   Left     Significant Hospital Events: Including procedures, antibiotic start and stop dates in addition to other pertinent events   12/7 admitted with polymorphic VT's in the setting of  hypomagnesemia/hypokalemia  Interim History / Subjective:  As above  Objective    Blood pressure (!) 160/96, pulse (!) 102, temperature 98.9 F (37.2 C), temperature source Axillary, resp. rate 15, height 5' 2.5 (1.588 m), weight 110.6 kg, SpO2 100%.    Vent Mode: PSV;CPAP FiO2 (%):  [40 %-100 %] 40 % Set Rate:  [18 bmp] 18 bmp Vt Set:  [400 mL-410 mL] 410 mL PEEP:  [5 cmH20] 5 cmH20 Pressure Support:  [5 cmH20] 5 cmH20 Plateau Pressure:  [20 cmH20] 20 cmH20   Intake/Output Summary (Last 24 hours) at 03/29/2024 9196 Last data filed at 03/29/2024 0700 Gross per 24 hour  Intake 897.19 ml  Output 385 ml  Net 512.19 ml   Filed Weights   03/28/24 2309 03/29/24 0500  Weight: 110.6 kg 110.6 kg    Examination: General: Crtitically ill-appearing morbidly obese middle-aged female, orally intubated HEENT: [place]/AT, eyes anicteric.  ETT and OGT in place Neuro: Opens eyes with vocal stimuli, following simple commands, moving all 4 extremities Chest: Reduced air entry at the bases bilaterally, no wheezes or rhonchi Heart: Tachycardic, regular rhythm, no murmurs or gallops Abdomen: Soft, nondistended, bowel sounds present  Labs and images reviewed  Patient Lines/Drains/Airways Status     Active Line/Drains/Airways     Name Placement date Placement time Site Days   Peripheral IV 03/28/24 20 G  Posterior;Right Hand 03/28/24  2327  Hand  1   Peripheral IV 03/28/24 20 G Left Antecubital 03/28/24  2343  Antecubital  1   Peripheral IV 03/29/24 20 G Left;Posterior Hand 03/29/24  --  Hand  less than 1   Sheath 03/29/24 Left Femoral 03/29/24  0116  Femoral  less than 1   NG/OG Vented/Dual Lumen 14 Fr. Oral Marking at nare/corner of mouth 60 cm 03/29/24  0300  Oral  less than 1   Urethral Catheter Sophia, RN 14 Fr. 03/29/24  0315  --  less than 1   Airway 7.5 mm 03/28/24  1223  -- 1   Wound 03/19/24 2240 Irritant Contact Dermatitis Flank Right;Upper 03/19/24  2240  Flank  10   Wound 03/19/24  2240 Irritant Contact Dermatitis Groin Bilateral 03/19/24  2240  Groin  10           Resolved problem list   Assessment and Plan  Recurrent polymorphic VT's/torsade Recurrent hypomagnesemia/hypokalemia Acute respiratory failure with hypoxia HIV on HAART Morbid obesity Diabetes type 2 Hypertension OSA  Continue lidocaine  infusion Cardiology is following Continue aggressive electrolyte replacement and monitor, keep potassium >4 and magnesium  >2.5 Underwent TVP for overdrive pacing Continue lung protective ventilation VAP prevention bundle in place PAD protocol with Versed  and fentanyl  She is tolerating spontaneous breathing trial She can be extubated anytime once her cardiac arrhythmia settles Continue antiretroviral medications Diet and exercise counseling as appropriate Her hemoglobin A1c is 4.8 Continue sliding scale insulin  with CBG goal 140-180 Started on lisinopril as her blood pressure is not well-controlled She will need sleep studies as an outpatient  PCCM will follow along   Labs   CBC: Recent Labs  Lab 03/28/24 2324 03/29/24 0242 03/29/24 0253  WBC 8.4 13.3*  --   HGB 12.1 13.6 12.9  HCT 35.8* 40.9 38.0  MCV 98.4 100.0  --   PLT 311 279  --     Basic Metabolic Panel: Recent Labs  Lab 03/23/24 0628 03/28/24 2324 03/29/24 0242 03/29/24 0253 03/29/24 0355  NA 134* 138  --  135 139  K 3.4* 2.4*  --  2.6* 2.9*  CL 97* 103  --   --  95*  CO2 29 17*  --   --  27  GLUCOSE 85 106*  --   --  129*  BUN <5* <5*  --   --  <5*  CREATININE 0.50 0.55  --   --  0.64  CALCIUM  7.8* 7.1*  --   --  8.5*  MG 1.9 0.7*  --   --  2.8*  PHOS 1.6*  --  2.4*  --   --    GFR: Estimated Creatinine Clearance: 108.5 mL/min (by C-G formula based on SCr of 0.64 mg/dL). Recent Labs  Lab 03/28/24 2324 03/28/24 2354 03/29/24 0242 03/29/24 0451  WBC 8.4  --  13.3*  --   LATICACIDVEN  --  7.9*  --  2.6*    Liver Function Tests: Recent Labs  Lab 03/23/24 0628  03/28/24 2324 03/29/24 0355  AST 28 42* 39  ALT 21 22 25   ALKPHOS 96 92 118  BILITOT 1.3* 1.2 1.7*  PROT 5.7* 5.7* 6.9  ALBUMIN 2.7* 2.5* 3.1*   Recent Labs  Lab 03/28/24 2324  LIPASE 24   No results for input(s): AMMONIA in the last 168 hours.  ABG    Component Value Date/Time   PHART 7.363 03/29/2024 0253   PCO2ART 46.7 03/29/2024 0253  PO2ART 168 (H) 03/29/2024 0253   HCO3 26.6 03/29/2024 0253   TCO2 28 03/29/2024 0253   ACIDBASEDEF 3.0 (H) 03/20/2024 0954   O2SAT 99 03/29/2024 0253     Coagulation Profile: Recent Labs  Lab 03/29/24 0242  INR 1.7*    Cardiac Enzymes: No results for input(s): CKTOTAL, CKMB, CKMBINDEX, TROPONINI in the last 168 hours.  HbA1C: Hgb A1c MFr Bld  Date/Time Value Ref Range Status  03/19/2024 06:39 PM 4.8 4.8 - 5.6 % Final    Comment:    (NOTE)         Prediabetes: 5.7 - 6.4         Diabetes: >6.4         Glycemic control for adults with diabetes: <7.0   12/03/2023 03:31 PM 4.9 4.8 - 5.6 % Final    Comment:             Prediabetes: 5.7 - 6.4          Diabetes: >6.4          Glycemic control for adults with diabetes: <7.0     CBG: Recent Labs  Lab 03/23/24 0415 03/23/24 0740 03/23/24 1123 03/29/24 0309 03/29/24 0728  GLUCAP 90 94 102* 128* 111*    Review of Systems:   Unable to obtain as patient is intubated  Past Medical History:  She,  has a past medical history of ADHD, Anxiety, Asthma, Avascular necrosis (HCC), Back pain, Depression, Diabetes mellitus without complication (HCC), Elevated LFTs, Fatty liver (04/16/2016), GERD (gastroesophageal reflux disease), Heart murmur, History of heat stroke, History of thrombocytopenia, HIV (human immunodeficiency virus infection) (HCC), Hyperlipidemia, Hypertension, Hypothyroidism, Infertility, female, Obese, PCOS (polycystic ovarian syndrome), Pneumonia (04/17/2016), SOB (shortness of breath), Wears partial dentures, and Wrist fracture (11/2017).   Surgical  History:   Past Surgical History:  Procedure Laterality Date   BIOPSY  06/22/2023   Procedure: BIOPSY;  Surgeon: Shila Gustav GAILS, MD;  Location: [facility] ENDOSCOPY;  Service: Gastroenterology;;   ESOPHAGOGASTRODUODENOSCOPY (EGD) WITH PROPOFOL  N/A 06/22/2023   Procedure: ESOPHAGOGASTRODUODENOSCOPY (EGD) WITH PROPOFOL ;  Surgeon: Shila Gustav GAILS, MD;  Location: [facility] ENDOSCOPY;  Service: Gastroenterology;  Laterality: N/A;   HYSTEROSCOPY N/A 12/31/2017   Procedure: HYSTEROSCOPY and polypectomy;  Surgeon: Yalcinkaya, Tamer, MD;  Location: [facility];  Service: Gynecology;  Laterality: N/A;   I & D EXTREMITY Left 07/15/2020   Procedure: PARTIAL EXCISION LEFT TIBIA;  Surgeon: Harden Jerona GAILS, MD;  Location: [facility] OR;  Service: Orthopedics;  Laterality: Left;   IR FLUORO GUIDE CV LINE LEFT  09/26/2020   IR RADIOLOGIST EVAL & MGMT  08/12/2020   maxillofacial surgery     OPEN REDUCTION INTERNAL FIXATION (ORIF) FOOT LISFRANC FRACTURE Left 06/11/2023   Procedure: OPEN TREAMENT OF LEFT FIRST, SECOND, FOURTH METATARSALS, MEDIAL CUNEIFORM FRACTURE, CUBOID, FIRST SECOND THRID TARSOMETATARSAL AND LISFRANC JOINT;  Surgeon: Elsa Lonni SAUNDERS, MD;  Location: [facility] OR;  Service: Orthopedics;  Laterality: Left;   RIGHT/LEFT HEART CATH AND CORONARY ANGIOGRAPHY N/A 03/19/2024   Procedure: RIGHT/LEFT HEART CATH AND CORONARY ANGIOGRAPHY;  Surgeon: Anner Alm ORN, MD;  Location: [facility] INVASIVE CV LAB;  Service: Cardiovascular;  Laterality: N/A;   TEMPORARY PACEMAKER N/A 03/19/2024   Procedure: TEMPORARY PACEMAKER;  Surgeon: Anner Alm ORN, MD;  Location: [facility] INVASIVE CV LAB;  Service: Cardiovascular;  Laterality: N/A;     Social History:   reports that she has quit smoking. Her smoking use included cigarettes. She has a 4.5 pack-year smoking history. She has never used  smokeless tobacco. She reports current alcohol use of about 7.0 standard drinks of alcohol per week. She reports that she does not currently use drugs  after having used the following drugs: Crack cocaine.   Family History:  Her family history includes Depression in her father and mother; Hyperlipidemia in her father; Hypertension in her father; Hypothyroidism in her mother; Obesity in her father and mother; Thyroid disease in her mother.   Allergies Allergies  Allergen Reactions   Citalopram Hydrobromide Other (See Comments)    Felt weird 04/2021 Admission for pVT/torsades 03/2024    Lithium  Other (See Comments)    Required hospitalization for VT/torsades 03/2024 > would not restart     Cephalexin  Other (See Comments)    Burning face   Fluconazole  Other (See Comments)    Required hospitalization for VT/torsades > would not restart     Hydrocodone -Acetaminophen  Hives and Itching   Latex Other (See Comments)    As a teenager, when using latex condoms, she would get a yeast infection.   Mucinex  Fast-Max Chest Cong Ms [Guaifenesin ] Nausea And Vomiting   Ancef  [Cefazolin ] Rash   Lisinopril Cough     Home Medications  Prior to Admission medications   Medication Sig Start Date End Date Taking? Authorizing Provider  albuterol  (VENTOLIN  HFA) 108 (90 Base) MCG/ACT inhaler INHALE 1 PUFF INTO THE LUNGS EVERY 6 HOURS AS NEEDED FOR WHEEZING OR SHORTNESS OF BREATH 06/05/22   Eben Reyes BROCKS, MD  ALPRAZolam  (XANAX ) 1 MG tablet Take 1 tablet (1 mg total) by mouth 3 (three) times daily as needed for anxiety. 08/28/23   Eben Reyes BROCKS, MD  amLODipine  (NORVASC ) 5 MG tablet Take 1 tablet (5 mg total) by mouth daily. Follow with your PCP for refills 03/24/24 04/23/24  Perri DELENA Meliton Mickey., MD  aspirin  81 MG chewable tablet Chew 1 tablet (81 mg total) by mouth daily. 03/24/24 06/22/24  Perri DELENA Meliton Mickey., MD  atorvastatin  (LIPITOR ) 80 MG tablet Take 1 tablet (80 mg total) by mouth daily. 03/24/24 06/22/24  Perri DELENA Meliton Mickey., MD  budesonide -formoterol  (SYMBICORT ) 80-4.5 MCG/ACT inhaler Inhale 2 puffs into the lungs daily. Patient taking  differently: Inhale 2 puffs into the lungs daily as needed (when sick). 06/12/22   Eben Reyes BROCKS, MD  dolutegravir -lamiVUDine  ([name] ) 50-300 MG tablet Take 1 tablet by mouth every evening. 08/20/23   Eben Reyes BROCKS, MD  folic acid  (FOLVITE ) 1 MG tablet Take 1 tablet (1 mg total) by mouth daily. Patient not taking: Reported on 03/20/2024 06/23/23   Masters, Izetta, DO  Glucose Blood (BLOOD GLUCOSE TEST STRIPS) STRP 1 each by Does not apply route as directed. Dispense based on patient and insurance preference. Use up to four times daily as directed. (FOR ICD-10 E10.9, E11.9). 02/26/24   Eben Reyes BROCKS, MD  IMODIUM A-D 2 MG capsule Take 2 mg by mouth as needed for diarrhea or loose stools.    [provider]  Iron , Ferrous Sulfate , 325 (65 Fe) MG TABS Take 1 tablet by mouth daily. Patient taking differently: Take 325 mg by mouth daily after supper. 07/30/23 03/20/24  Eben Reyes BROCKS, MD  Lancets MISC 1 each by Does not apply route as directed. Dispense based on patient and insurance preference. Use up to four times daily as directed. (FOR ICD-10 E10.9, E11.9). 02/26/24   Eben Reyes BROCKS, MD  Magnesium  200 MG TABS Take 2 tablets (400 mg total) by mouth daily. 01/11/24   Regalado, Owen DELENA, MD  MOUNJARO  2.5 MG/0.5ML  Pen Inject 2.5 mg into the skin once a week. 03/14/24   [provider]  naloxone (NARCAN) nasal spray 4 mg/0.1 mL Place 1 spray into the nose once as needed (AS DIRECTED FOR A CRISIS). 09/03/23   [provider]  NEOMYCIN -POLYMYXIN-HYDROCORTISONE (CORTISPORIN ) 1 % SOLN OTIC solution Place 3 drops into the left ear every 6 (six) hours for 7 days. Patient not taking: Reported on 03/29/2024 03/23/24 03/30/24  Perri DELENA Meliton Mickey., MD  pantoprazole  (PROTONIX ) 40 MG tablet Take 1 tablet (40 mg total) by mouth daily. Patient taking differently: Take 40 mg by mouth at bedtime. 08/19/23   Eben Reyes BROCKS, MD  sertraline  (ZOLOFT ) 50 MG tablet Take 1 tablet (50 mg  total) by mouth at bedtime. Follow with your psychiatrist for refills.  You should probably get an EKG to evaluate for QT prolongation. 03/23/24 04/22/24  Perri DELENA Meliton Mickey., MD     Critical care time:       The patient is critically ill due to recurrent VT's requiring close monitoring/acute respiratory failure with hypoxia requiring titration of ventilator and sedation.  Critical care was necessary to treat or prevent imminent or life-threatening deterioration.  Critical care was time spent personally by me on the following activities: development of treatment plan with patient and/or surrogate as well as nursing, discussions with consultants, evaluation of patient's response to treatment, examination of patient, obtaining history from patient or surrogate, ordering and performing treatments and interventions, ordering and review of laboratory studies, ordering and review of radiographic studies, pulse oximetry, re-evaluation of patient's condition and participation in multidisciplinary rounds.   During this encounter critical care time was devoted to patient care services described in this note for 40 minutes.     Valinda Novas, MD [facility] See Amion for pager If no response to pager, please call [phone] until 7pm After 7pm, Please call E-link [phone]

## 2024-03-29 NOTE — ED Notes (Addendum)
 100mg  of lidocaine  bolused from bag. Verbal order from Debarah, MD.

## 2024-03-29 NOTE — Progress Notes (Signed)
 Pt transported from Cath lab to [facility] without complications.

## 2024-03-29 NOTE — ED Provider Notes (Signed)
 [facility] Emergency Department Provider Note MRN:  [id number]  Arrival date & time: 03/29/24     Chief Complaint   Chest Pain and Shortness of Breath   History of Present Illness   Lindsay French is a 43 y.o. year-old female with a history of HIV, diabetes presenting to the ED with chief complaint of chest pain and shortness of breath.  Nausea vomiting chest pain and shortness of breath today, feels extremely unwell.  Feels like she did the last time she was here in the hospital for abnormal heart rhythm.  Review of Systems  A thorough review of systems was obtained and all systems are negative except as noted in the HPI and PMH.   Patient's Health History    Past Medical History:  Diagnosis Date   ADHD    Anxiety    Asthma    usually with colds   Avascular necrosis (HCC)    Back pain    Depression    Diabetes mellitus without complication (HCC)    Elevated LFTs    Fatty liver 04/16/2016   Noted US  ABD   GERD (gastroesophageal reflux disease)    Heart murmur    at birth, no issues   History of heat stroke    with syncope   History of thrombocytopenia    prior to diagnosis HIV   HIV (human immunodeficiency virus infection) (HCC)    Hyperlipidemia    Hypertension    Hypothyroidism    Infertility, female    Obese    PCOS (polycystic ovarian syndrome)    Pneumonia 04/17/2016   SOB (shortness of breath)    Wears partial dentures    upper   Wrist fracture 11/2017   Left    Past Surgical History:  Procedure Laterality Date   BIOPSY  06/22/2023   Procedure: BIOPSY;  Surgeon: Shila Gustav GAILS, MD;  [facility]: [facility];  Service: Gastroenterology;;   ESOPHAGOGASTRODUODENOSCOPY (EGD) WITH PROPOFOL  N/A 06/22/2023   Procedure: ESOPHAGOGASTRODUODENOSCOPY (EGD) WITH PROPOFOL ;  Surgeon: Shila Gustav GAILS, MD;  Location: [facility];  Service: Gastroenterology;  Laterality: N/A;   HYSTEROSCOPY N/A 12/31/2017   Procedure: HYSTEROSCOPY and  polypectomy;  Surgeon: Yalcinkaya, Tamer, MD;  Location: [facility];  Service: Gynecology;  Laterality: N/A;   I & D EXTREMITY Left 07/15/2020   Procedure: PARTIAL EXCISION LEFT TIBIA;  Surgeon: Harden Jerona GAILS, MD;  Location: [facility] OR;  Service: Orthopedics;  Laterality: Left;   IR FLUORO GUIDE CV LINE LEFT  09/26/2020   IR RADIOLOGIST EVAL & MGMT  08/12/2020   maxillofacial surgery     OPEN REDUCTION INTERNAL FIXATION (ORIF) FOOT LISFRANC FRACTURE Left 06/11/2023   Procedure: OPEN TREAMENT OF LEFT FIRST, SECOND, FOURTH METATARSALS, MEDIAL CUNEIFORM FRACTURE, CUBOID, FIRST SECOND THRID TARSOMETATARSAL AND LISFRANC JOINT;  Surgeon: Elsa Lonni SAUNDERS, MD;  Location: [facility] OR;  Service: Orthopedics;  Laterality: Left;   RIGHT/LEFT HEART CATH AND CORONARY ANGIOGRAPHY N/A 03/19/2024   Procedure: RIGHT/LEFT HEART CATH AND CORONARY ANGIOGRAPHY;  Surgeon: Anner Alm ORN, MD;  Location: [facility] INVASIVE CV LAB;  Service: Cardiovascular;  Laterality: N/A;   TEMPORARY PACEMAKER N/A 03/19/2024   Procedure: TEMPORARY PACEMAKER;  Surgeon: Anner Alm ORN, MD;  Location: [facility] INVASIVE CV LAB;  Service: Cardiovascular;  Laterality: N/A;    Family History  Problem Relation Age of Onset   Thyroid disease Mother    Hypothyroidism Mother    Depression Mother    Obesity Mother    Obesity Father  Depression Father    Hyperlipidemia Father    Hypertension Father     Social History   Socioeconomic History   Marital status: Married    Spouse name: Not on file   Number of children: Not on file   Years of education: Not on file   Highest education level: Not on file  Occupational History   Occupation: Daycare Owner  Tobacco Use   Smoking status: Former    Current packs/day: 0.30    Average packs/day: 0.3 packs/day for 15.0 years (4.5 ttl pk-yrs)    Types: Cigarettes   Smokeless tobacco: Never  Vaping Use   Vaping status: Never Used  Substance and Sexual Activity   Alcohol use: Yes     Alcohol/week: 7.0 standard drinks of alcohol    Types: 7 Glasses of wine per week    Comment: wine  2-3 times a week   Drug use: Not Currently    Types: Crack cocaine    Comment: Previous hx of crack use - clean for 7 years    Sexual activity: Not Currently    Partners: Male    Birth control/protection: None    Comment: try to conceive   Other Topics Concern   Not on file  Social History Narrative   ** Merged History Encounter **       Social Drivers of Health   Financial Resource Strain: Low Risk  (07/31/2022)   Overall Financial Resource Strain (CARDIA)    Difficulty of Paying Living Expenses: Not hard at all  Food Insecurity: No Food Insecurity (03/26/2024)   Hunger Vital Sign    Worried About Running Out of Food in the Last Year: Never true    Ran Out of Food in the Last Year: Never true  Transportation Needs: No Transportation Needs (03/26/2024)   PRAPARE - Administrator, Civil Service (Medical): No    Lack of Transportation (Non-Medical): No  Physical Activity: Sufficiently Active (07/31/2022)   Exercise Vital Sign    Days of Exercise per Week: 7 days    Minutes of Exercise per Session: 30 min  Stress: No Stress Concern Present (07/31/2022)   [employer] of Occupational Health - Occupational Stress Questionnaire    Feeling of Stress : Not at all  Social Connections: Moderately Integrated (07/31/2022)   Social Connection and Isolation Panel    Frequency of Communication with Friends and Family: More than three times a week    Frequency of Social Gatherings with Friends and Family: Twice a week    Attends Religious Services: Never    Database Administrator or Organizations: Yes    Attends Banker Meetings: Never    Marital Status: Married  Recent Concern: Social Connections - Moderately Isolated (06/12/2022)   Social Connection and Isolation Panel    Frequency of Communication with Friends and Family: More than three times a week    Frequency  of Social Gatherings with Friends and Family: More than three times a week    Attends Religious Services: Never    Database Administrator or Organizations: No    Attends Engineer, Structural: Not on file    Marital Status: Married  Catering Manager Violence: Not At Risk (03/26/2024)   Humiliation, Afraid, Rape, and Kick questionnaire    Fear of Current or Ex-Partner: No    Emotionally Abused: No    Physically Abused: No    Sexually Abused: No     Physical Exam  Vitals:   03/28/24 2315 03/28/24 2330  BP: 128/67 104/86  Pulse: 93 69  Resp: (!) 9 17  Temp:    SpO2: 100% 100%    CONSTITUTIONAL: Ill-appearing NEURO/PSYCH:  Alert and oriented x 3, no focal deficits EYES:  eyes equal and reactive ENT/NECK:  no LAD, no JVD CARDIO: Tachycardic rate, seems poorly perfused PULM:  CTAB no wheezing or rhonchi GI/GU:  non-distended, non-tender MSK/SPINE:  No gross deformities, no edema SKIN:  no rash, atraumatic   *Additional and/or pertinent findings included in MDM below  Diagnostic and Interventional Summary    EKG Interpretation Date/Time:  Sunday March 29 2024 00:05:05 EST Ventricular Rate:  155 PR Interval:  241 QRS Duration:  105 QT Interval:  502 QTC Calculation: 514 R Axis:   62  Text Interpretation: Atrial fibrillation Ventricular tachycardia, unsustained Borderline repolarization abnormality Prolonged QT interval Confirmed by Theadore Sharper [phone]) on 03/29/2024 12:31:50 AM       Labs Reviewed  BASIC METABOLIC PANEL WITH GFR - Abnormal; Notable for the following components:      Result Value   Potassium 2.4 (*)    CO2 17 (*)    Glucose, Bld 106 (*)    BUN <5 (*)    Calcium  7.1 (*)    Anion gap 18 (*)    All other components within normal limits  CBC - Abnormal; Notable for the following components:   RBC 3.64 (*)    HCT 35.8 (*)    nRBC 0.6 (*)    All other components within normal limits  I-STAT CG4 LACTIC ACID, ED - Abnormal; Notable for the  following components:   Lactic Acid, Venous 7.9 (*)    All other components within normal limits  HEPATIC FUNCTION PANEL  LIPASE, BLOOD  MAGNESIUM   TROPONIN I (HIGH SENSITIVITY)    DG Chest 2 View    (Results Pending)    Medications  lidocaine  (cardiac) 2000 mg in dextrose  5% 500 mL (4mg /mL) IV infusion (1 mg/min Intravenous New Bag/Given 03/28/24 2336)  lactated ringers  infusion (has no administration in time range)  magnesium  sulfate IVPB 2 g 50 mL (has no administration in time range)  rocuronium  (ZEMURON ) 100 MG/10ML injection (has no administration in time range)  succinylcholine  (ANECTINE ) 200 MG/10ML syringe (has no administration in time range)  etomidate  (AMIDATE ) 2 MG/ML injection (has no administration in time range)  midazolam  (VERSED ) 2 MG/2ML injection (has no administration in time range)  fentaNYL  (SUBLIMAZE ) 50 MCG/ML injection (has no administration in time range)  etomidate  (AMIDATE ) 2 MG/ML injection (has no administration in time range)  fentaNYL  in NS (36mcg/ml) infusion-PREMIX (25 mcg/hr Intravenous New Bag/Given 03/29/24 0031)  midazolam  (VERSED ) 100 mg/100 mL (1 mg/mL) premix infusion (0.5 mg/hr Intravenous New Bag/Given 03/29/24 0028)  potassium chloride  10 mEq in 100 mL IVPB (has no administration in time range)  midazolam -sodium chloride  100-0.9 MG/100ML-% infusion (has no administration in time range)  fentaNYL  10 mcg/ml infusion (has no administration in time range)  magnesium  sulfate IVPB 2 g 50 mL (0 g Intravenous Stopped 03/28/24 2354)  sodium chloride  0.9 % bolus 1,000 mL (1,000 mLs Intravenous New Bag/Given 03/28/24 2352)  LORazepam  (ATIVAN ) injection 2 mg (2 mg Intravenous Given 03/28/24 2351)  LORazepam  (ATIVAN ) injection 2 mg (2 mg Intravenous Given 03/29/24 0017)  LORazepam  (ATIVAN ) 2 MG/ML injection (2 mg  Given 03/29/24 0018)  magnesium  sulfate 2 GM/50ML IVPB (  Rate/Dose Change 03/29/24 0025)     Procedures  /  Critical  Care .Critical  Care  Performed by: Theadore Ozell HERO, MD Authorized by: Theadore Ozell HERO, MD   Critical care provider statement:    Critical care time (minutes):  80   Critical care was necessary to treat or prevent imminent or life-threatening deterioration of the following conditions: Severe cardiac arrhythmia.   Critical care was time spent personally by me on the following activities:  Development of treatment plan with patient or surrogate, discussions with consultants, evaluation of patient's response to treatment, examination of patient, ordering and review of laboratory studies, ordering and review of radiographic studies, ordering and performing treatments and interventions, pulse oximetry, re-evaluation of patient's condition and review of old charts Procedure Name: Intubation Date/Time: 03/29/2024 12:32 AM  Performed by: Theadore Ozell HERO, MDPre-anesthesia Checklist: Patient identified, Patient being monitored, Emergency Drugs available, Timeout performed and Suction available Oxygen Delivery Method: Non-rebreather mask Preoxygenation: Pre-oxygenation with 100% oxygen Induction Type: Rapid sequence Ventilation: Mask ventilation without difficulty Laryngoscope Size: Glidescope and 3 Grade View: Grade I Tube size: 7.5 mm Number of attempts: 1 Airway Equipment and Method: Rigid stylet Placement Confirmation: ETT inserted through vocal cords under direct vision, CO2 detector and Breath sounds checked- equal and bilateral Secured at: 21 cm Tube secured with: ETT holder Comments: Intubated with 20 mg etomidate , 100 mg rocuronium       ED Course and Medical Decision Making  Initial Impression and Ddx Patient was recently admitted for severe electrolyte derangement with torsades, at that time required intubation and transvenous pacing.  Seems to be having very similar presentation, severe ectopy on the monitor and patient looks quite ill.  Patient rapidly given IV magnesium , placed on cardiac monitoring  which is ZOLL.  Lidocaine  drip started and I reached out to cardiology for assistance.  Past medical/surgical history that increases complexity of ED encounter: History of torsades  Interpretation of Diagnostics I personally reviewed the EKG and my interpretation is as follows: Multiple EKGs performed with frequent nonsustained V. tach, frequent ventricular bigeminy, occasional sinus tachycardia.  Labs reveal hypokalemia, significant lactic acid elevation  Patient Reassessment and Ultimate Disposition/Management     After discussing with cardiology, the plan is for deep sedation to assist with management of this patient's electrolyte derangement and ectopy.  And so plan is to intubate and then bring patient to the Cath Lab for transvenous pacing.  Dr. Debarah of cardiology to facilitate ICU admission thereafter.  Patient management required discussion with the following services or consulting groups:  Cardiology  Complexity of Problems Addressed Acute illness or injury that poses threat of life of bodily function  Additional Data Reviewed and Analyzed Further history obtained from: Recent discharge summary and Prior labs/imaging results  Additional Factors Impacting ED Encounter Risk Consideration of hospitalization and Major procedures  Ozell HERO. Theadore, MD [facility] Emergency Medicine [facility] [email or web address]  Final Clinical Impressions(s) / ED Diagnoses     ICD-10-CM   1. Cardiac arrhythmia, unspecified cardiac arrhythmia type  I49.9     2. Electrolyte disturbance  E87.8       ED Discharge Orders     None        Discharge Instructions Discussed with and Provided to Patient:   Discharge Instructions   None      Theadore Ozell HERO, MD 03/29/24 [phone]

## 2024-03-29 NOTE — ED Notes (Addendum)
 Emergent intubation by Theadore, MD start. Two respiratory therapists at bedside. This nurse with two other RNs. Donley Devonshire, MD at bedside. Patient gave verbal consent with all in room witnessing.

## 2024-03-29 NOTE — ED Notes (Signed)
 This nurse noted that patient was having multiple runs of v-tach on the cardiac monitor. This nurse assessed patient, patient stated that she felt better but still not good. This nurse shot an EKG at 0005 and handed it to the [facility], MD and alerted Theadore, MD. Both providers at the bedside.

## 2024-03-29 NOTE — ED Notes (Addendum)
$'100mg'U$  of rocuronium administered.

## 2024-03-29 NOTE — Procedures (Signed)
 Extubation Procedure Note  Patient Details:   Name: Lindsay French DOB: 1980-11-04 MRN: [id number]   Airway Documentation:    Vent end date: 03/29/24 Vent end time: 1421   Evaluation  O2 sats: stable throughout Complications: No apparent complications Patient did tolerate procedure well. Bilateral Breath Sounds: Clear, Diminished   Yes  Positive cuff leak noted. Patient placed on [place] 4L with humidity, no stridor noted, patient able to reach 1500 mL using the incentive spirometer.  Cy RAMAN Symphony Demuro 03/29/2024, 2:26 PM

## 2024-03-30 ENCOUNTER — Telehealth: Payer: Self-pay

## 2024-03-30 ENCOUNTER — Telehealth: Payer: Self-pay | Admitting: Acute Care

## 2024-03-30 DIAGNOSIS — G4733 Obstructive sleep apnea (adult) (pediatric): Secondary | ICD-10-CM

## 2024-03-30 DIAGNOSIS — Z21 Asymptomatic human immunodeficiency virus [HIV] infection status: Secondary | ICD-10-CM

## 2024-03-30 DIAGNOSIS — I499 Cardiac arrhythmia, unspecified: Secondary | ICD-10-CM

## 2024-03-30 DIAGNOSIS — F411 Generalized anxiety disorder: Secondary | ICD-10-CM

## 2024-03-30 DIAGNOSIS — E878 Other disorders of electrolyte and fluid balance, not elsewhere classified: Secondary | ICD-10-CM

## 2024-03-30 DIAGNOSIS — K3184 Gastroparesis: Secondary | ICD-10-CM

## 2024-03-30 DIAGNOSIS — F339 Major depressive disorder, recurrent, unspecified: Secondary | ICD-10-CM

## 2024-03-30 LAB — GLUCOSE, CAPILLARY
Glucose-Capillary: 102 mg/dL — ABNORMAL HIGH (ref 70–99)
Glucose-Capillary: 104 mg/dL — ABNORMAL HIGH (ref 70–99)
Glucose-Capillary: 125 mg/dL — ABNORMAL HIGH (ref 70–99)
Glucose-Capillary: 100 mg/dL — ABNORMAL HIGH (ref 70–99)
Glucose-Capillary: 98 mg/dL (ref 70–99)
Glucose-Capillary: 123 mg/dL — ABNORMAL HIGH (ref 70–99)
Glucose-Capillary: 110 mg/dL — ABNORMAL HIGH (ref 70–99)

## 2024-03-30 LAB — CD4/CD8 (T-HELPER/T-SUPPRESSOR CELL)
CD4 absolute: 1055 /uL (ref 400–1790)
CD4%: 47.97 % (ref 33–65)
CD8 T Cell Abs: 658 /uL (ref 190–1000)
CD8tox: 29.92 % (ref 12–40)
Ratio: 1.6 (ref 1.0–3.0)
Total lymphocyte count: 2200 /uL (ref 1000–4000)

## 2024-03-30 LAB — CBC
HCT: 33.5 % — ABNORMAL LOW (ref 36.0–46.0)
Hemoglobin: 10.8 g/dL — ABNORMAL LOW (ref 12.0–15.0)
MCH: 33.2 pg (ref 26.0–34.0)
MCHC: 32.2 g/dL (ref 30.0–36.0)
MCV: 103.1 fL — ABNORMAL HIGH (ref 80.0–100.0)
Platelets: 207 K/uL (ref 150–400)
RBC: 3.25 MIL/uL — ABNORMAL LOW (ref 3.87–5.11)
RDW: 13.3 % (ref 11.5–15.5)
WBC: 10.9 K/uL — ABNORMAL HIGH (ref 4.0–10.5)
nRBC: 0 % (ref 0.0–0.2)

## 2024-03-30 LAB — RENAL FUNCTION PANEL
Albumin: 2.6 g/dL — ABNORMAL LOW (ref 3.5–5.0)
Anion gap: 10 (ref 5–15)
BUN: <5 mg/dL — ABNORMAL LOW (ref 6–20)
CO2: 28 mmol/L (ref 22–32)
Calcium: 8.2 mg/dL — ABNORMAL LOW (ref 8.9–10.3)
Chloride: 97 mmol/L — ABNORMAL LOW (ref 98–111)
Creatinine, Ser: 0.64 mg/dL (ref 0.44–1.00)
GFR, Estimated: >60 mL/min (ref >60)
Glucose, Bld: 105 mg/dL — ABNORMAL HIGH (ref 70–99)
Phosphorus: 2.7 mg/dL (ref 2.5–4.6)
Potassium: 4.4 mmol/L (ref 3.5–5.1)
Sodium: 135 mmol/L (ref 135–145)

## 2024-03-30 LAB — MAGNESIUM: Magnesium: 1.9 mg/dL (ref 1.7–2.4)

## 2024-03-30 LAB — LIDOCAINE LEVEL: Lidocaine Lvl: 2.8 ug/mL (ref 1.5–5.0)

## 2024-03-30 MED ORDER — MAGNESIUM SULFATE 4 GM/100ML IV SOLN
4.0000 g | Freq: Once | INTRAVENOUS | Status: AC
  Administered 2024-03-30: 4 g via INTRAVENOUS
  Filled 2024-03-30: qty 100

## 2024-03-30 MED ORDER — POTASSIUM CHLORIDE 20 MEQ PO PACK
40.0000 meq | PACK | Freq: Every day | ORAL | Status: DC
  Administered 2024-03-30 – 2024-03-31 (×2): 40 meq via ORAL
  Filled 2024-03-30 (×2): qty 2

## 2024-03-30 MED ORDER — ASPIRIN 81 MG PO TBEC
81.0000 mg | DELAYED_RELEASE_TABLET | Freq: Every day | ORAL | Status: DC
  Administered 2024-03-30 – 2024-04-01 (×3): 81 mg via ORAL
  Filled 2024-03-30 (×3): qty 1

## 2024-03-30 MED ORDER — SODIUM CHLORIDE 0.9 % IV SOLN: INTRAVENOUS | Status: AC | PRN

## 2024-03-30 MED ORDER — ALPRAZOLAM 0.5 MG PO TABS
1.0000 mg | ORAL_TABLET | Freq: Three times a day (TID) | ORAL | Status: DC | PRN
  Administered 2024-03-30 – 2024-04-01 (×4): 1 mg via ORAL
  Filled 2024-03-30 (×4): qty 2

## 2024-03-30 MED ORDER — ATORVASTATIN CALCIUM 80 MG PO TABS
80.0000 mg | ORAL_TABLET | Freq: Every day | ORAL | Status: DC
  Administered 2024-03-30 – 2024-04-01 (×3): 80 mg via ORAL
  Filled 2024-03-30 (×2): qty 1

## 2024-03-30 NOTE — Progress Notes (Signed)
 PT Cancellation Note  Patient Details Name: Lindsay French MRN: [id number] DOB: [birth year]   Cancelled Treatment:    Reason Eval/Treat Not Completed: Medical issues which prohibited therapy. Pt currently has TVP through femoral site. PT will hold currently and follow up when temporary femoral pacer is removed.   Bernardino JINNY Ruth 03/30/2024, 8:34 AM

## 2024-03-30 NOTE — Progress Notes (Signed)
 Internal Medicine Teaching Service/Infectious Disease   Date of Admission:  03/28/2024      ID: Lindsay French is a 43 y.o. female with   Principal Problem:   Torsades de pointes [facility])    Subjective: 43 yo F with hx of HIV+ HIV 1 RNA Quant (Copies/mL)  Date Value  06/26/2021 23 (H)  08/30/2020 <20 (H)  03/10/2020 <20   HIV-1 RNA Viral Load (copies/mL)  Date Value  12/03/2023 <20  06/19/2023 <20  02/27/2022 <20   CD4 T Cell Abs (/uL)  Date Value  12/03/2023 1,229  06/19/2023 1,067  02/27/2022 963   DM2, obesity, severe anxiety d/o, cor pulmonale, was in hospital 11-027 to 12-1 with seizures, prolonged QT and torsades. She underwent catheterization and was found to have clean coronaries. Her anti-depressant was changed to zoloft  and her camila was held.  She missed her f/u appt with me last week.   She texted me over the weekend that she was having a severe anxiety attack. SHe admitted to drinking 1-2 shots of alcohol/day.  She returns to hospital 12-6 with CP, SOB, tachycardia (polymorphic v-tach) and sever hypoMg (0.7) and hypo K (2.4). Her lactic acid was 7.9. She was hypotensive.  Trops were 3/15.  She was started on IV lidocaine , intubated and received a transvenous pacemaker.  Her electroylytes have been repleted and she is now extubated. Pacer remains in place for now.   Medications:   aspirin  EC  81 mg Oral Daily   atorvastatin   80 mg Oral Daily   Chlorhexidine  Gluconate Cloth  6 each Topical Daily   dolutegravir -lamiVUDine   1 tablet Oral QPM   folic acid   1 mg Oral Daily   insulin  aspart  0-15 Units Subcutaneous Q4H   mupirocin  ointment  1 Application Nasal BID   pantoprazole  (PROTONIX ) IV  40 mg Intravenous QHS   potassium chloride   40 mEq Oral Daily   Review of Systems  Constitutional:  Positive for weight loss (down to 238 #).  Gastrointestinal:  Negative for constipation and diarrhea.  Genitourinary:  Negative for dysuria.  Psychiatric/Behavioral:   Positive for depression. The patient is nervous/anxious.    States she was taking her monjaro last week but has otherwise been off all meds.   Objective: Vital signs in last 24 hours: Temp:  [97.7 F (36.5 C)-99 F (37.2 C)] 98.2 F (36.8 C) (12/08 1156) Pulse Rate:  [78-107] 88 (12/08 1315) Resp:  [11-23] 18 (12/08 1315) BP: (68-144)/(43-122) 113/59 (12/08 1315) SpO2:  [89 %-100 %] 98 % (12/08 1300) Weight:  [116.3 kg] 116.3 kg (12/08 0500)   General appearance: alert, cooperative, and no distress Eyes: negative findings: pupils equal, round, reactive to light and accomodation Throat: lips, mucosa, and tongue normal; teeth and gums normal Neck: no adenopathy and supple, symmetrical, trachea midline Resp: clear to auscultation bilaterally Cardio: regular rate and rhythm GI: normal findings: bowel sounds normal and soft, non-tender Extremities: edema non-pitting Incision/Wound: L groin wound (pacer site) clean.  Lab Results Recent Labs    03/29/24 0242 03/29/24 0253 03/29/24 0355 03/29/24 1357 03/30/24 0359  WBC 13.3*  --   --   --  10.9*  HGB 13.6 12.9  --   --  10.8*  HCT 40.9 38.0  --   --  33.5*  NA  --  135   < > 135 135  K  --  2.6*   < > 4.0 4.4  CL  --   --    < >  101 97*  CO2  --   --    < > 25 28  BUN  --   --    < > <5* <5*  CREATININE  --   --    < > 0.51 0.64   < > = values in this interval not displayed.   Liver Panel Recent Labs    03/28/24 2324 03/29/24 0355 03/29/24 1357 03/30/24 0359  PROT 5.7* 6.9  --   --   ALBUMIN 2.5* 3.1* 2.8* 2.6*  AST 42* 39  --   --   ALT 22 25  --   --   ALKPHOS 92 118  --   --   BILITOT 1.2 1.7*  --   --   BILIDIR 0.4*  --   --   --   IBILI 0.8  --   --   --    Sedimentation Rate No results for input(s): ESRSEDRATE in the last 72 hours. C-Reactive Protein No results for input(s): CRP in the last 72 hours.  Microbiology:  Studies/Results: DG Abd Portable 1V Result Date: 03/29/2024 EXAM: 1 VIEW XRAY  OF THE ABDOMEN 03/29/2024 03:40:00 AM COMPARISON: 03/20/2024 CLINICAL HISTORY: Encounter for orogastric (OG) tube placement FINDINGS: LINES, TUBES AND DEVICES: Enteric tube in stomach with side port below gastroesophageal junction. Inferior approach cardiac pacer lead noted. BOWEL: Paucity of bowel gas. SOFT TISSUES: No abnormal calcifications. BONES: No acute fracture. LUNGS: Similar streaky left lower lobe atelectasis. IMPRESSION: 1. Enteric tube in good position. Electronically signed by: Morgane Naveau MD 03/29/2024 03:51 AM EST RP Workstation: [id number]   CARDIAC CATHETERIZATION Result Date: 03/29/2024 Successful placement of temporary transvenous pacemaker insertion     Assessment/Plan: Polymorphic V tach Electrolyte abnormalities Depression/Anxiety HIV+ DM2 Morbid Obesity  Appreciate CV, EP and CCM Encourage ETOH abstinence Stop monjaro (re-enforced to pt) Continue dovato  (reinforced to pt) Will ask psych to see her (she started drinking etoh to decrease her anxiety. Since her last adm she is off lithium , prozac  and doesn't feel her zoloft  is at the correct dose. Her prev psychiatrist has dismissed her for missing (8) appts).  Encourage pt to f/u with me in clinic- she needs a pcp in the [place] (which she has been too anxious to do).  Encourage pt to make f/u appts and that I cannot manage her complex medical problems via text messaging.   Reyes Fenton Internal Medicine Teaching Service/Infectious Diseases [phone] c [phone] pg  03/30/2024, 1:57 PM

## 2024-03-30 NOTE — Telephone Encounter (Signed)
 We need an order for the Polysomnography before we can get this patient scheduled

## 2024-03-30 NOTE — Progress Notes (Signed)
 EKG reviewed, QTc .     Plan:  -stop lidocaine  infusion  -monitor for further NSVT / VT events overnight -if no further episodes now that electrolytes are normalized, plan to pull TVP in am to allow for cMRI     Daphne Barrack, NP-C, AGACNP-BC [facility] - Electrophysiology  03/30/2024, 3:34 PM

## 2024-03-30 NOTE — Plan of Care (Signed)
  Problem: Education: Goal: Knowledge of General Education information will improve Description: Including pain rating scale, medication(s)/side effects and non-pharmacologic comfort measures Outcome: Progressing   Problem: Health Behavior/Discharge Planning: Goal: Ability to manage health-related needs will improve Outcome: Progressing   Problem: Clinical Measurements: Goal: Ability to maintain clinical measurements within normal limits will improve Outcome: Progressing Goal: Diagnostic test results will improve Outcome: Progressing   Problem: Nutrition: Goal: Adequate nutrition will be maintained Outcome: Progressing   Problem: Coping: Goal: Ability to adjust to condition or change in health will improve Outcome: Progressing

## 2024-03-30 NOTE — Progress Notes (Signed)
 NAME:  Lindsay French, MRN:  [id number], DOB:  1980/06/05, LOS: 1 ADMISSION DATE:  03/28/2024, CONSULTATION DATE: 03/30/2019 REFERRING MD:  Debarah Donley SAILOR, MD , CHIEF COMPLAINT: Chest pain and shortness of breath  History of Present Illness:  43 year old morbidly obese female with diabetes, hypertension, HIV on HARRT, cor pulmonale and recurrent VTE's in the setting of electrolyte abnormalities including hypokalemia and hypomagnesemia who presented overnight to ED with chest pain and shortness of breath on telemetry monitoring she was noted to have runs of polymorphic NSVT.  On workup she was noted to have magnesium  of 0.7, serum potassium of 2.4 and lactate of 7.9.  She was started on IV lidocaine  infusion and was given IV Ativan  with good decrease in mental status she was intubated and was admitted to ICU.  She is getting aggressive electrolyte replacement and currently on lidocaine  infusion at 1 mg PCCM was consulted for evaluation and medical management  Pertinent  Medical History   Past Medical History:  Diagnosis Date   ADHD    Anxiety    Asthma    usually with colds   Avascular necrosis (HCC)    Back pain    Depression    Diabetes mellitus without complication (HCC)    Elevated LFTs    Fatty liver 04/16/2016   Noted US  ABD   GERD (gastroesophageal reflux disease)    Heart murmur    at birth, no issues   History of heat stroke    with syncope   History of thrombocytopenia    prior to diagnosis HIV   HIV (human immunodeficiency virus infection) (HCC)    Hyperlipidemia    Hypertension    Hypothyroidism    Infertility, female    Obese    PCOS (polycystic ovarian syndrome)    Pneumonia 04/17/2016   SOB (shortness of breath)    Wears partial dentures    upper   Wrist fracture 11/2017   Left    Significant Hospital Events: Including procedures, antibiotic start and stop dates in addition to other pertinent events   12/7 admitted with polymorphic VT's in the setting of  hypomagnesemia/hypokalemia, temp pacing. Extubated  Interim History / Subjective:  NAEO. Patient eating breakfast during exam. Oxygenating well on 4L.  Patient has concerns for chronic anxiety; requesting new psychiatrist; as well as chronic insomnia that is contributory to her having missed prior appointments with her psychiatrist leading to being dismissed from the practice. Advised her to contact her PCP for an appointment to discuss anxiety and provide referral for psychiatrist that offers telehealth. As far as insomnia, recommend a sleep study, will coordinate with our clinic to reach out to her for an appointment. Fem sheath prohibiting mobility; will discuss length of pacing with HF.  Objective    Blood pressure 112/63, pulse 85, temperature 98.6 F (37 C), temperature source Oral, resp. rate 16, height 5' 2.5 (1.588 m), weight 116.3 kg, SpO2 95%.    Vent Mode: PSV;CPAP FiO2 (%):  [40 %] 40 % Set Rate:  [18 bmp] 18 bmp Vt Set:  [410 mL] 410 mL PEEP:  [5 cmH20] 5 cmH20 Pressure Support:  [5 cmH20] 5 cmH20 Plateau Pressure:  [16 cmH20] 16 cmH20   Intake/Output Summary (Last 24 hours) at 03/30/2024 0935 Last data filed at 03/30/2024 0800 Gross per 24 hour  Intake 902.2 ml  Output 1370 ml  Net -467.8 ml   Filed Weights   03/28/24 2309 03/29/24 0500 03/30/24 0500  Weight: 110.6 kg 110.6 kg 116.3  kg   Examination: General: acutely-ill morbidly obese woman, in NAD HEENT: AT/[place], PERRL, 3mm bilaterally, wearing glasses Pulm: tachypneic, mild expiratory wheeze; oxygenating well on 4L [place] CV: Tachycardic; paced, RR, no m/g/r GI: soft, obese, non tender to palpation Neuro: A&O x3, no focal deficits   Labs reviewed  Patient Lines/Drains/Airways Status     Active Line/Drains/Airways     Name Placement date Placement time Site Days   Peripheral IV 03/28/24 20 G Posterior;Right Hand 03/28/24  2327  Hand  2   Peripheral IV 03/28/24 20 G Left Antecubital 03/28/24  2343  Antecubital   2   Peripheral IV 03/29/24 20 G Left;Posterior Hand 03/29/24  --  Hand  1   Sheath 03/29/24 Left Femoral 03/29/24  0116  Femoral  1   Urethral Catheter Sophia, RN 14 Fr. 03/29/24  0315  --  1   Wound 03/19/24 2240 Irritant Contact Dermatitis Flank Right;Upper 03/19/24  2240  Flank  11   Wound 03/19/24 2240 Irritant Contact Dermatitis Groin Bilateral 03/19/24  2240  Groin  11           Resolved problem list   Assessment and Plan  Recurrent polymorphic VT's/torsade Recurrent hypomagnesemia/hypokalemia Acute respiratory failure with hypoxia HIV on HAART Morbid obesity Diabetes type 2 Hypertension OSA  -Cardiology is following; s/p TVP for overdrive pacing -Per EP; no perm pacing at this time; prior episodes have been reversible; prior normal cath -Continue lidocaine  infusion -Continue aggressive electrolyte replacement and monitor, keep potassium >4 and magnesium  >2.5 -Wean supplemental O2 to maintain SpO2 >94% -Continue antiretroviral medications -Diet and exercise counseling as appropriate -Hgb 4.8; Continue sliding scale insulin  with CBG goal 140-180 -Started on Lisinopril foe uncontrolled hypertension -She will need sleep studies as an outpatient  PCCM will follow along   Labs   CBC: Recent Labs  Lab 03/28/24 2324 03/29/24 0242 03/29/24 0253 03/30/24 0359  WBC 8.4 13.3*  --  10.9*  HGB 12.1 13.6 12.9 10.8*  HCT 35.8* 40.9 38.0 33.5*  MCV 98.4 100.0  --  103.1*  PLT 311 279  --  207    Basic Metabolic Panel: Recent Labs  Lab 03/28/24 2324 03/29/24 0242 03/29/24 0253 03/29/24 0355 03/29/24 1357 03/30/24 0359  NA 138  --  135 139 135 135  K 2.4*  --  2.6* 2.9* 4.0 4.4  CL 103  --   --  95* 101 97*  CO2 17*  --   --  27 25 28   GLUCOSE 106*  --   --  129* 102* 105*  BUN <5*  --   --  <5* <5* <5*  CREATININE 0.55  --   --  0.64 0.51 0.64  CALCIUM  7.1*  --   --  8.5* 7.8* 8.2*  MG 0.7*  --   --  2.8* 2.4  --   PHOS  --  2.4*  --   --  3.5 2.7    GFR: Estimated Creatinine Clearance: 111.8 mL/min (by C-G formula based on SCr of 0.64 mg/dL). Recent Labs  Lab 03/28/24 2324 03/28/24 2354 03/29/24 0242 03/29/24 0451 03/29/24 0834 03/29/24 1510 03/30/24 0359  WBC 8.4  --  13.3*  --   --   --  10.9*  LATICACIDVEN  --  7.9*  --  2.6* 1.4 1.1  --     Liver Function Tests: Recent Labs  Lab 03/28/24 2324 03/29/24 0355 03/29/24 1357 03/30/24 0359  AST 42* 39  --   --  ALT 22 25  --   --   ALKPHOS 92 118  --   --   BILITOT 1.2 1.7*  --   --   PROT 5.7* 6.9  --   --   ALBUMIN 2.5* 3.1* 2.8* 2.6*   Recent Labs  Lab 03/28/24 2324  LIPASE 24   No results for input(s): AMMONIA in the last 168 hours.  ABG    Component Value Date/Time   PHART 7.363 03/29/2024 0253   PCO2ART 46.7 03/29/2024 0253   PO2ART 168 (H) 03/29/2024 0253   HCO3 26.6 03/29/2024 0253   TCO2 28 03/29/2024 0253   ACIDBASEDEF 3.0 (H) 03/20/2024 0954   O2SAT 99 03/29/2024 0253    Coagulation Profile: Recent Labs  Lab 03/29/24 0242  INR 1.7*   Cardiac Enzymes: No results for input(s): CKTOTAL, CKMB, CKMBINDEX, TROPONINI in the last 168 hours.  HbA1C: Hgb A1c MFr Bld  Date/Time Value Ref Range Status  03/19/2024 06:39 PM 4.8 4.8 - 5.6 % Final    Comment:    (NOTE)         Prediabetes: 5.7 - 6.4         Diabetes: >6.4         Glycemic control for adults with diabetes: <7.0   12/03/2023 03:31 PM 4.9 4.8 - 5.6 % Final    Comment:             Prediabetes: 5.7 - 6.4          Diabetes: >6.4          Glycemic control for adults with diabetes: <7.0    CBG: Recent Labs  Lab 03/29/24 1952 03/29/24 2335 03/30/24 0352 03/30/24 0615 03/30/24 0752  GLUCAP 122* 121* 102* 104* 125*   Review of Systems:   Review of systems completed with pertinent positives/negatives outlined in above HPI.  Past Medical History:  She,  has a past medical history of ADHD, Anxiety, Asthma, Avascular necrosis (HCC), Back pain, Depression, Diabetes  mellitus without complication (HCC), Elevated LFTs, Fatty liver (04/16/2016), GERD (gastroesophageal reflux disease), Heart murmur, History of heat stroke, History of thrombocytopenia, HIV (human immunodeficiency virus infection) (HCC), Hyperlipidemia, Hypertension, Hypothyroidism, Infertility, female, Obese, PCOS (polycystic ovarian syndrome), Pneumonia (04/17/2016), SOB (shortness of breath), Wears partial dentures, and Wrist fracture (11/2017).   Surgical History:   Past Surgical History:  Procedure Laterality Date   BIOPSY  06/22/2023   Procedure: BIOPSY;  Surgeon: Shila Gustav GAILS, MD;  Location: [facility] ENDOSCOPY;  Service: Gastroenterology;;   ESOPHAGOGASTRODUODENOSCOPY (EGD) WITH PROPOFOL  N/A 06/22/2023   Procedure: ESOPHAGOGASTRODUODENOSCOPY (EGD) WITH PROPOFOL ;  Surgeon: Shila Gustav GAILS, MD;  Location: [facility] ENDOSCOPY;  Service: Gastroenterology;  Laterality: N/A;   HYSTEROSCOPY N/A 12/31/2017   Procedure: HYSTEROSCOPY and polypectomy;  Surgeon: Yalcinkaya, Tamer, MD;  Location: [facility];  Service: Gynecology;  Laterality: N/A;   I & D EXTREMITY Left 07/15/2020   Procedure: PARTIAL EXCISION LEFT TIBIA;  Surgeon: Harden Jerona GAILS, MD;  Location: [facility] OR;  Service: Orthopedics;  Laterality: Left;   IR FLUORO GUIDE CV LINE LEFT  09/26/2020   IR RADIOLOGIST EVAL & MGMT  08/12/2020   maxillofacial surgery     OPEN REDUCTION INTERNAL FIXATION (ORIF) FOOT LISFRANC FRACTURE Left 06/11/2023   Procedure: OPEN TREAMENT OF LEFT FIRST, SECOND, FOURTH METATARSALS, MEDIAL CUNEIFORM FRACTURE, CUBOID, FIRST SECOND THRID TARSOMETATARSAL AND LISFRANC JOINT;  Surgeon: Elsa Lonni SAUNDERS, MD;  Location: [facility] OR;  Service: Orthopedics;  Laterality: Left;   RIGHT/LEFT HEART CATH AND  CORONARY ANGIOGRAPHY N/A 03/19/2024   Procedure: RIGHT/LEFT HEART CATH AND CORONARY ANGIOGRAPHY;  Surgeon: Anner Alm ORN, MD;  Location: [facility] INVASIVE CV LAB;  Service: Cardiovascular;  Laterality: N/A;   TEMPORARY PACEMAKER  N/A 03/19/2024   Procedure: TEMPORARY PACEMAKER;  Surgeon: Anner Alm ORN, MD;  Location: [facility] INVASIVE CV LAB;  Service: Cardiovascular;  Laterality: N/A;   TEMPORARY PACEMAKER N/A 03/29/2024   Procedure: TEMPORARY PACEMAKER;  Surgeon: Jordan, Peter M, MD;  Location: [facility] INVASIVE CV LAB;  Service: Cardiovascular;  Laterality: N/A;     Social History:   reports that she has quit smoking. Her smoking use included cigarettes. She has a 4.5 pack-year smoking history. She has never used smokeless tobacco. She reports current alcohol use of about 7.0 standard drinks of alcohol per week. She reports that she does not currently use drugs after having used the following drugs: Crack cocaine.   Family History:  Her family history includes Depression in her father and mother; Hyperlipidemia in her father; Hypertension in her father; Hypothyroidism in her mother; Obesity in her father and mother; Thyroid disease in her mother.   Allergies Allergies  Allergen Reactions   Citalopram Hydrobromide Other (See Comments)    Felt weird 04/2021 Admission for pVT/torsades 03/2024    Fluconazole  Other (See Comments)    Required hospitalization for VT/torsades > would not restart     Lithium  Other (See Comments)    Required hospitalization for VT/torsades 03/2024 > would not restart     Cephalexin  Other (See Comments)    Burning face   Hydrocodone -Acetaminophen  Hives and Itching   Mucinex  Fast-Max Chest Cong Ms [Guaifenesin ] Nausea And Vomiting   Ancef  [Cefazolin ] Rash   Latex Other (See Comments)    As a teenager, when using latex condoms, she would get a yeast infection.   Lisinopril Cough     Home Medications  Prior to Admission medications   Medication Sig Start Date End Date Taking? Authorizing Provider  albuterol  (VENTOLIN  HFA) 108 (90 Base) MCG/ACT inhaler INHALE 1 PUFF INTO THE LUNGS EVERY 6 HOURS AS NEEDED FOR WHEEZING OR SHORTNESS OF BREATH 06/05/22   Eben Reyes BROCKS, MD  ALPRAZolam   (XANAX ) 1 MG tablet Take 1 tablet (1 mg total) by mouth 3 (three) times Lindsay as needed for anxiety. 08/28/23   Eben Reyes BROCKS, MD  amLODipine  (NORVASC ) 5 MG tablet Take 1 tablet (5 mg total) by mouth Lindsay. Follow with your PCP for refills 03/24/24 04/23/24  Perri DELENA Meliton Mickey., MD  aspirin  81 MG chewable tablet Chew 1 tablet (81 mg total) by mouth Lindsay. 03/24/24 06/22/24  Perri DELENA Meliton Mickey., MD  atorvastatin  (LIPITOR ) 80 MG tablet Take 1 tablet (80 mg total) by mouth Lindsay. 03/24/24 06/22/24  Perri DELENA Meliton Mickey., MD  budesonide -formoterol  (SYMBICORT ) 80-4.5 MCG/ACT inhaler Inhale 2 puffs into the lungs Lindsay. Patient taking differently: Inhale 2 puffs into the lungs Lindsay as needed (when sick). 06/12/22   Eben Reyes BROCKS, MD  dolutegravir -lamiVUDine  ([name] ) 50-300 MG tablet Take 1 tablet by mouth every evening. 08/20/23   Eben Reyes BROCKS, MD  folic acid  (FOLVITE ) 1 MG tablet Take 1 tablet (1 mg total) by mouth Lindsay. Patient not taking: Reported on 03/20/2024 06/23/23   Masters, Izetta, DO  Glucose Blood (BLOOD GLUCOSE TEST STRIPS) STRP 1 each by Does not apply route as directed. Dispense based on patient and insurance preference. Use up to four times Lindsay as directed. (FOR ICD-10 E10.9, E11.9). 02/26/24   Eben Reyes BROCKS, MD  IMODIUM A-D 2 MG capsule Take 2 mg by mouth as needed for diarrhea or loose stools.    [provider]  Iron , Ferrous Sulfate , 325 (65 Fe) MG TABS Take 1 tablet by mouth Lindsay. Patient taking differently: Take 325 mg by mouth Lindsay after supper. 07/30/23 03/20/24  Eben Reyes BROCKS, MD  Lancets MISC 1 each by Does not apply route as directed. Dispense based on patient and insurance preference. Use up to four times Lindsay as directed. (FOR ICD-10 E10.9, E11.9). 02/26/24   Eben Reyes BROCKS, MD  Magnesium  200 MG TABS Take 2 tablets (400 mg total) by mouth Lindsay. 01/11/24   Regalado, Belkys A, MD  MOUNJARO  2.5 MG/0.5ML Pen Inject 2.5 mg into the skin once a week.  03/14/24   [provider]  naloxone (NARCAN) nasal spray 4 mg/0.1 mL Place 1 spray into the nose once as needed (AS DIRECTED FOR A CRISIS). 09/03/23   [provider]  NEOMYCIN -POLYMYXIN-HYDROCORTISONE (CORTISPORIN ) 1 % SOLN OTIC solution Place 3 drops into the left ear every 6 (six) hours for 7 days. Patient not taking: Reported on 03/29/2024 03/23/24 03/30/24  Perri DELENA Meliton Mickey., MD  pantoprazole  (PROTONIX ) 40 MG tablet Take 1 tablet (40 mg total) by mouth Lindsay. Patient taking differently: Take 40 mg by mouth at bedtime. 08/19/23   Eben Reyes BROCKS, MD  sertraline  (ZOLOFT ) 50 MG tablet Take 1 tablet (50 mg total) by mouth at bedtime. Follow with your psychiatrist for refills.  You should probably get an EKG to evaluate for QT prolongation. 03/23/24 04/22/24  Perri DELENA Meliton Mickey., MD    Warren Shade, DNP, AGACNP-BC [facility]  Please see [email or web address] for pager details.  From 7A-7P if no response, please call [phone]. After hours, please call ELink [phone].

## 2024-03-30 NOTE — Consult Note (Cosign Needed Addendum)
 ELECTROPHYSIOLOGY CONSULT NOTE    Patient ID: Lindsay French MRN: [id number], DOB/AGE: [birth year] 42 y.o.  Admit date: 03/28/2024 Date of Consult: 03/30/2024  Primary Physician: Eben Reyes BROCKS, MD Primary Cardiologist: None  Electrophysiologist: Dr. Kennyth   Referring Provider: Dr. Zenaida  Patient Profile: Lindsay French is a 43 y.o. female with a history of morbid obesity on Ozempic  with intractable N/V, HTN, HLD, DM II, GERD, ITP, HIV, PCOS, depression / anxiety, prolonged QT in setting of electrolyte disturbances & recent PMVT who is being seen today for the evaluation of PMVT at the request of Dr. Zenaida .  HPI:  Lindsay French is a 43 y.o. female who presented to [facility] ER 03/28/24 with chest pain and shortness of breath.    She was recently admitted 11/27-12/1/25 with N/V/D. She had seizures and syncope in ER.  Tele was concerning for torsades de pointes.  She underwent LHC 03/19/24 which showed normal coronary arteries, mild PH, LVEF hyperdynamic 65-70%, incessant recurrent PMVT / torsades and required defibrillation x1.  Prior to admit, she was on multiple agents that potentially could prolong QT > lithium , fluconazole , compazine , prozac .    While in ER, she was noted on tele to have frequent runs of NSVT, bigeminy.  She was given IV lidocaine  and magnesium .  Despite this, she continued to have PMVT. Initial labs notable for profound electrolyte disturbances > K+ 2.4 / Mg+ 0.7.  She was hemodynamically unstable and decision was made for intubation / sedation & TVP. Initial lactate 7.9.  She was stabilized and subsequently extubated.  Tele review shows short bursts of NSVT.  She states she did not take any of her medications after her most recent discharge.  She initially denies any ETOH but upon further questioning, she admits to ongoing 1-2 shots of ETOH per day.  She is not sure when she last used imodium. She states she thinks her Zoloft  isn't working (though hasn't taken) and that she  feels she needs psychiatry as an outpatient.   She denies chest pain, pressure, shortness of breath, pre-syncope / syncope.    Labs Potassium4.4 (12/08 0359) Magnesium   2.4 (12/07 1357) Creatinine, ser  0.64 (12/08 0359) PLT  207 (12/08 0359) HGB  10.8* (12/08 0359) WBC 10.9* (12/08 0359) Troponin I (High Sensitivity)15 (12/07 0355).    Past Medical History:  Diagnosis Date   ADHD    Anxiety    Asthma    usually with colds   Avascular necrosis (HCC)    Back pain    Depression    Diabetes mellitus without complication (HCC)    Elevated LFTs    Fatty liver 04/16/2016   Noted US  ABD   GERD (gastroesophageal reflux disease)    Heart murmur    at birth, no issues   History of heat stroke    with syncope   History of thrombocytopenia    prior to diagnosis HIV   HIV (human immunodeficiency virus infection) (HCC)    Hyperlipidemia    Hypertension    Hypothyroidism    Infertility, female    Obese    PCOS (polycystic ovarian syndrome)    Pneumonia 04/17/2016   SOB (shortness of breath)    Wears partial dentures    upper   Wrist fracture 11/2017   Left     Surgical History:  Past Surgical History:  Procedure Laterality Date   BIOPSY  06/22/2023   Procedure: BIOPSY;  Surgeon: Shila Gustav GAILS, MD;  Location: [facility];  Service: Gastroenterology;;  ESOPHAGOGASTRODUODENOSCOPY (EGD) WITH PROPOFOL  N/A 06/22/2023   Procedure: ESOPHAGOGASTRODUODENOSCOPY (EGD) WITH PROPOFOL ;  Surgeon: Shila Gustav GAILS, MD;  Location: [facility];  Service: Gastroenterology;  Laterality: N/A;   HYSTEROSCOPY N/A 12/31/2017   Procedure: HYSTEROSCOPY and polypectomy;  Surgeon: Yalcinkaya, Tamer, MD;  [facility];  Service: Gynecology;  Laterality: N/A;   I & D EXTREMITY Left 07/15/2020   Procedure: PARTIAL EXCISION LEFT TIBIA;  Surgeon: Harden Jerona GAILS, MD;  Location: [facility] OR;  Service: Orthopedics;  Laterality: Left;   IR FLUORO GUIDE CV LINE LEFT  09/26/2020   IR  RADIOLOGIST EVAL & MGMT  08/12/2020   maxillofacial surgery     OPEN REDUCTION INTERNAL FIXATION (ORIF) FOOT LISFRANC FRACTURE Left 06/11/2023   Procedure: OPEN TREAMENT OF LEFT FIRST, SECOND, FOURTH METATARSALS, MEDIAL CUNEIFORM FRACTURE, CUBOID, FIRST SECOND THRID TARSOMETATARSAL AND LISFRANC JOINT;  Surgeon: Elsa Lonni SAUNDERS, MD;  Location: [facility] OR;  Service: Orthopedics;  Laterality: Left;   RIGHT/LEFT HEART CATH AND CORONARY ANGIOGRAPHY N/A 03/19/2024   Procedure: RIGHT/LEFT HEART CATH AND CORONARY ANGIOGRAPHY;  Surgeon: Anner Alm ORN, MD;  Location: [facility] INVASIVE CV LAB;  Service: Cardiovascular;  Laterality: N/A;   TEMPORARY PACEMAKER N/A 03/19/2024   Procedure: TEMPORARY PACEMAKER;  Surgeon: Anner Alm ORN, MD;  Location: [facility] INVASIVE CV LAB;  Service: Cardiovascular;  Laterality: N/A;   TEMPORARY PACEMAKER N/A 03/29/2024   Procedure: TEMPORARY PACEMAKER;  Surgeon: Jordan, Peter M, MD;  Location: [facility] INVASIVE CV LAB;  Service: Cardiovascular;  Laterality: N/A;     Medications Prior to Admission  Medication Sig Dispense Refill Last Dose/Taking   albuterol  (VENTOLIN  HFA) 108 (90 Base) MCG/ACT inhaler INHALE 1 PUFF INTO THE LUNGS EVERY 6 HOURS AS NEEDED FOR WHEEZING OR SHORTNESS OF BREATH 18 g 0    ALPRAZolam  (XANAX ) 1 MG tablet Take 1 tablet (1 mg total) by mouth 3 (three) times daily as needed for anxiety. 30 tablet 1    amLODipine  (NORVASC ) 5 MG tablet Take 1 tablet (5 mg total) by mouth daily. Follow with your PCP for refills 30 tablet 0    aspirin  81 MG chewable tablet Chew 1 tablet (81 mg total) by mouth daily. 90 tablet 0    atorvastatin  (LIPITOR ) 80 MG tablet Take 1 tablet (80 mg total) by mouth daily. 90 tablet 0    budesonide -formoterol  (SYMBICORT ) 80-4.5 MCG/ACT inhaler Inhale 2 puffs into the lungs daily. (Patient taking differently: Inhale 2 puffs into the lungs daily as needed (when sick).) 1 each 12    dolutegravir -lamiVUDine  ([name] ) 50-300 MG tablet Take 1 tablet by mouth  every evening. 90 tablet 3    folic acid  (FOLVITE ) 1 MG tablet Take 1 tablet (1 mg total) by mouth daily. (Patient not taking: Reported on 03/20/2024) 30 tablet 6    Glucose Blood (BLOOD GLUCOSE TEST STRIPS) STRP 1 each by Does not apply route as directed. Dispense based on patient and insurance preference. Use up to four times daily as directed. (FOR ICD-10 E10.9, E11.9). 100 strip 0    IMODIUM A-D 2 MG capsule Take 2 mg by mouth as needed for diarrhea or loose stools.      Iron , Ferrous Sulfate , 325 (65 Fe) MG TABS Take 1 tablet by mouth daily. (Patient taking differently: Take 325 mg by mouth daily after supper.) 90 tablet 3    Lancets MISC 1 each by Does not apply route as directed. Dispense based on patient and insurance preference. Use up to four times daily as directed. (FOR ICD-10  E10.9, E11.9). 100 each 0    Magnesium  200 MG TABS Take 2 tablets (400 mg total) by mouth daily. 30 tablet 0    [Paused] MOUNJARO  2.5 MG/0.5ML Pen Inject 2.5 mg into the skin once a week.      naloxone (NARCAN) nasal spray 4 mg/0.1 mL Place 1 spray into the nose once as needed (AS DIRECTED FOR A CRISIS).      NEOMYCIN -POLYMYXIN-HYDROCORTISONE (CORTISPORIN ) 1 % SOLN OTIC solution Place 3 drops into the left ear every 6 (six) hours for 7 days. (Patient not taking: Reported on 03/29/2024) 10 mL 0 Not Taking   pantoprazole  (PROTONIX ) 40 MG tablet Take 1 tablet (40 mg total) by mouth daily. (Patient taking differently: Take 40 mg by mouth at bedtime.) 90 tablet 1    sertraline  (ZOLOFT ) 50 MG tablet Take 1 tablet (50 mg total) by mouth at bedtime. Follow with your psychiatrist for refills.  You should probably get an EKG to evaluate for QT prolongation. 30 tablet 0     Inpatient Medications:   aspirin   81 mg Per Tube Daily   atorvastatin   80 mg Per Tube Daily   Chlorhexidine  Gluconate Cloth  6 each Topical Daily   dolutegravir -lamiVUDine   1 tablet Oral QPM   folic acid   1 mg Oral Daily   insulin  aspart  0-15 Units  Subcutaneous Q4H   mupirocin  ointment  1 Application Nasal BID   pantoprazole  (PROTONIX ) IV  40 mg Intravenous QHS   potassium chloride   40 mEq Per Tube Daily    Allergies:  Allergies  Allergen Reactions   Citalopram Hydrobromide Other (See Comments)    Felt weird 04/2021 Admission for pVT/torsades 03/2024    Fluconazole  Other (See Comments)    Required hospitalization for VT/torsades > would not restart     Lithium  Other (See Comments)    Required hospitalization for VT/torsades 03/2024 > would not restart     Cephalexin  Other (See Comments)    Burning face   Hydrocodone -Acetaminophen  Hives and Itching   Mucinex  Fast-Max Chest Cong Ms [Guaifenesin ] Nausea And Vomiting   Ancef  [Cefazolin ] Rash   Latex Other (See Comments)    As a teenager, when using latex condoms, she would get a yeast infection.   Lisinopril Cough    Family History  Problem Relation Age of Onset   Thyroid disease Mother    Hypothyroidism Mother    Depression Mother    Obesity Mother    Obesity Father    Depression Father    Hyperlipidemia Father    Hypertension Father      Physical Exam: Vitals:   03/30/24 0754 03/30/24 0800 03/30/24 0815 03/30/24 0830  BP:  104/63  112/63  Pulse:  86 85 85  Resp:  14 16 16   Temp: 98.6 F (37 C)     TempSrc: Oral     SpO2:  98% 95% 95%  Weight:      Height:        GEN- NAD, A&O x 3, normal affect HEENT: Normocephalic, atraumatic Lungs- CTAB, Normal effort.  Heart- Regular rate and rhythm, No M/G/R.  GI- Soft, NT, ND.  Extremities- No clubbing, cyanosis, or edema   Radiology/Studies: DG Abd Portable 1V Result Date: 03/29/2024 EXAM: 1 VIEW XRAY OF THE ABDOMEN 03/29/2024 03:40:00 AM COMPARISON: 03/20/2024 CLINICAL HISTORY: Encounter for orogastric (OG) tube placement FINDINGS: LINES, TUBES AND DEVICES: Enteric tube in stomach with side port below gastroesophageal junction. Inferior approach cardiac pacer lead noted. BOWEL: Paucity of bowel gas.  SOFT  TISSUES: No abnormal calcifications. BONES: No acute fracture. LUNGS: Similar streaky left lower lobe atelectasis. IMPRESSION: 1. Enteric tube in good position. Electronically signed by: Morgane Naveau MD 03/29/2024 03:51 AM EST RP Workstation: [id number]   CARDIAC CATHETERIZATION Result Date: 03/29/2024 Successful placement of temporary transvenous pacemaker insertion   ECHOCARDIOGRAM COMPLETE Result Date: 03/20/2024    ECHOCARDIOGRAM REPORT   Patient Name:   Lindsay French Date of Exam: 03/20/2024 Medical Rec #:  [id number]   Height:       62.0 in Accession #:    [id number]  Weight:       247.4 lb Date of Birth:  06-Jan-1981   BSA:          2.092 m Patient Age:    43 years    BP:           87/61 mmHg Patient Gender: F           HR:           66 bpm. Exam Location:  Inpatient Procedure: 2D Echo and Intracardiac Opacification Agent (Both Spectral and Color            Flow Doppler were utilized during procedure). Indications:    Ventricular Tachycardia I47.2  History:        Patient has no prior history of Echocardiogram examinations.  Sonographer:    Charmaine Gaskins Referring Phys: [id number] MICHAEL ANDREW TILLERY  Sonographer Comments: Echo performed with patient supine and on artificial respirator. Image acquisition challenging due to patient body habitus. IMPRESSIONS  1. Left ventricular ejection fraction, by estimation, is 55%. The left ventricle has normal function. The left ventricle has no regional wall motion abnormalities. Left ventricular diastolic parameters are indeterminate.  2. Right ventricular systolic function is moderately reduced. The right ventricular size is mildly enlarged.  3. The mitral valve is normal in structure. Trivial mitral valve regurgitation. No evidence of mitral stenosis.  4. Tricuspid valve regurgitation is mild to moderate.  5. The aortic valve is tricuspid. Aortic valve regurgitation is not visualized. No aortic stenosis is present. Comparison(s): No prior Echocardiogram.  Conclusion(s)/Recommendation(s): Normal LV systolic function with moderately reduced RV systolic function. No significant valvular disease. FINDINGS  Left Ventricle: Left ventricular ejection fraction, by estimation, is 55%. The left ventricle has normal function. The left ventricle has no regional wall motion abnormalities. Definity  contrast agent was given IV to delineate the left ventricular endocardial borders. The left ventricular internal cavity size was normal in size. There is no left ventricular hypertrophy. Left ventricular diastolic parameters are indeterminate. Right Ventricle: The right ventricular size is mildly enlarged. No increase in right ventricular wall thickness. Right ventricular systolic function is moderately reduced. Left Atrium: Left atrial size was normal in size. Right Atrium: Right atrial size was normal in size. Pericardium: There is no evidence of pericardial effusion. Mitral Valve: The mitral valve is normal in structure. Trivial mitral valve regurgitation. No evidence of mitral valve stenosis. Tricuspid Valve: The tricuspid valve is normal in structure. Tricuspid valve regurgitation is mild to moderate. No evidence of tricuspid stenosis. Aortic Valve: The aortic valve is tricuspid. Aortic valve regurgitation is not visualized. No aortic stenosis is present. Aortic valve mean gradient measures 4.0 mmHg. Aortic valve peak gradient measures 6.2 mmHg. Aortic valve area, by VTI measures 2.37 cm. Pulmonic Valve: The pulmonic valve was normal in structure. Pulmonic valve regurgitation is not visualized. No evidence of pulmonic stenosis. Aorta: The aortic root and ascending aorta are structurally normal, with no  evidence of dilitation. Venous: IVC assessment for right atrial pressure unable to be performed due to mechanical ventilation. IAS/Shunts: No atrial level shunt detected by color flow Doppler.  LEFT VENTRICLE PLAX 2D LVIDd:         4.70 cm   Diastology LVIDs:         3.20 cm   LV e'  medial:    8.05 cm/s LV PW:         1.00 cm   LV E/e' medial:  8.8 LV IVS:        0.90 cm   LV e' lateral:   6.09 cm/s LVOT diam:     1.94 cm   LV E/e' lateral: 11.6 LV SV:         58 LV SV Index:   28 LVOT Area:     2.96 cm  RIGHT VENTRICLE RV Basal diam:  3.42 cm RV Mid diam:    3.79 cm RV S prime:     8.81 cm/s TAPSE (M-mode): 1.3 cm LEFT ATRIUM             Index        RIGHT ATRIUM           Index LA diam:        3.13 cm 1.50 cm/m   RA Area:     13.50 cm LA Vol (A2C):   68.2 ml 32.59 ml/m  RA Volume:   31.50 ml  15.05 ml/m LA Vol (A4C):   42.3 ml 20.22 ml/m LA Biplane Vol: 56.3 ml 26.91 ml/m  AORTIC VALVE AV Area (Vmax):    2.50 cm AV Area (Vmean):   2.22 cm AV Area (VTI):     2.37 cm AV Vmax:           124.00 cm/s AV Vmean:          97.900 cm/s AV VTI:            0.244 m AV Peak Grad:      6.2 mmHg AV Mean Grad:      4.0 mmHg LVOT Vmax:         105.00 cm/s LVOT Vmean:        73.600 cm/s LVOT VTI:          0.196 m LVOT/AV VTI ratio: 0.80  AORTA Ao Root diam: 2.69 cm Ao Asc diam:  3.06 cm MITRAL VALVE               TRICUSPID VALVE MV Area (PHT): 3.19 cm    TR Peak grad:   20.1 mmHg MV Decel Time: 238 msec    TR Vmax:        224.00 cm/s MV E velocity: 70.70 cm/s MV A velocity: 30.90 cm/s  SHUNTS MV E/A ratio:  2.29        Systemic VTI:  0.20 m                            Systemic Diam: 1.94 cm Georganna Archer Electronically signed by Georganna Archer Signature Date/Time: 03/20/2024/1:32:10 PM    Final    DG Abd Portable 1V Result Date: 03/20/2024 EXAM: 1 VIEW XRAY OF THE ABDOMEN 03/20/2024 04:02:00 AM COMPARISON: None available. CLINICAL HISTORY: [id number] Encounter for orogastric (OG) tube placement [id number] Encounter for orogastric (OG) tube placement FINDINGS: LINES, TUBES AND DEVICES: An enteric catheter is present with its tip in the distal stomach. A cardiac pacer lead is  present which has been placed via an inferior approach. BOWEL: Nonobstructive bowel gas pattern. SOFT TISSUES: No opaque urinary  calculi. BONES: No acute osseous abnormality. HEART AND MEDIASTINUM: The heart is mildly enlarged. IMPRESSION: 1. Enteric catheter tip in the distal stomach. 2. Mildly enlarged heart with cardiac pacer lead in place via an inferior approach. Electronically signed by: Evalene Coho MD 03/20/2024 04:32 AM EST RP Workstation: [id number]   DG Chest Port 1 View Result Date: 03/19/2024 EXAM: 1 VIEW(S) XRAY OF THE CHEST 03/19/2024 11:13:00 PM COMPARISON: 11/09/2020 CLINICAL HISTORY: Check endotracheal tube placement. FINDINGS: LINES, TUBES AND DEVICES: The endotracheal tube is seen 3.5 cm above the carina. LUNGS AND PLEURA: Lungs are hypoinflated but clear. No pleural effusion. No pneumothorax. HEART AND MEDIASTINUM: Cardiac shadow is accentuated by the portable technique. BONES AND SOFT TISSUES: No bony abnormality is noted. IMPRESSION: 1. Endotracheal tube appropriately positioned, approximately 3.5 cm above the carina. 2. Low lung volumes without focal consolidation or edema. Electronically signed by: Oneil Devonshire MD 03/19/2024 11:19 PM EST RP Workstation: [id number]   CARDIAC CATHETERIZATION Result Date: 03/19/2024 Table formatting from the original result was not included. Images from the original result were not included.   There is hyperdynamic left ventricular systolic function.  The left ventricular ejection fraction is greater than 65% by visual estimate.  LV end diastolic pressure is mildly elevated.   Hemodynamic findings consistent with mild pulmonary hypertension.   There is no aortic valve stenosis. Dominance: Right     Left Ventriculography:EF > 65%, hyperdynamic FINDINGS Incessant Recurrent Polymorphic Ventricular Tachycardia/Torsades upon required defibrillation x 1 otherwise with self terminating Angiographically Normal Coronary Arteries Mild Pulmonary Hypertension with mean PAP 29 mmHg and a PCWP of 19 mm/LVEDP of 16 mmHg. Hyperdynamic left ventricular function EF roughly 65 to 70%, Cardiac  Output and Index also elevated. Fick 9.9-4.66; thermodilution 8.01-3.77 with AF sat 100% PA sat of 86% on ventilator   RECOMMENDATIONS Admit to CVICU on Versed  and fentanyl  drip. Arterial oxygen in place, can continue to use for monitoring pressures and oxygen saturations until adequate alternative access of obtained. Temporary wire is in place localized in the RV set up to a backup pace at 100 bpm at 5 MA if turned on.  Currently off. Continue to replete electrolytes Alm Clay, MD    EKG: 01/11/24 > NSR 90 bpm, QTc 484 ms 03/28/24 > ST with frequent PVC's, QTcB 567 ms (personally reviewed) Note multiple EKG's on 12/6 with QTc ranging from 458 ms to 567 ms   TELEMETRY: SR 80's-90's with occ VP.  Episodes of PM NSVT (personally reviewed)  DEVICE HISTORY: n/a   Assessment/Plan:  Recurrent Polymorphic VT  QT Prolongation  Baseline QTc ~480's, negative recent LHC  -avoid QT prolonging agents as able  -admit K+ 2.4 / Mg+ 0.7 > suspect likely culprit for VT substrate -PTA medications reviewed > would avoid further imodium if regular use -follow electrolytes closely, ensure goal K+>4, Mg+>2 -ETOH cessation paramount  -plan for cMRI to rule out scar mediated VT / infiltrative process -assess EKG, if QTc <500 ms will turn off lidocaine  infusion and monitor overnight with TVP in place with plan to pull in am -will turn down TVP to VVI 50 bpm and monitor for ectopy, pending review now that her electrolytes have been replaced, consider removal -given reversible causes of VT, do not feel she is an ICD candidate.   Electrolyte Disturbances: Hypokalemia, Hypomagnesemia  Suspect ETOH abuse primary driver + N/V from [place]  -aggressive electrolyte replacement  as above   Acute Respiratory Insufficiency in setting of VT  -per PCCM   HIV on HAART  -per primary       For questions or updates, please contact [facility] Please consult [email or web address] for contact info under      Signed, Daphne Barrack, NP-C, AGACNP-BC [facility] - Electrophysiology  03/30/2024, 8:58 AM   I have seen, examined the patient, and reviewed the above assessment and plan.    HPI: Ms. Saniah Schroeter is a 43 year old female with past medical history notable for morbid obesity on Ozempic  with intractable N/V, HTN, HLD, DM II, GERD, ITP, HIV, PCOS, depression / anxiety who presented to the ED on 03/28/2024 with chest pain and shortness of breath.  Patient found to have frequent runs of PSVT.   Patient was recently discharged from the hospital after being admitted 11/27 through 12/1 with nausea, vomiting, diarrhea which was complicated by torsades de points in the setting of numerous QT prolonging medications.  She had temporary pacing wire implanted and was started on IV lidocaine .  Lidocaine  was weaned off and pacing was discontinued.  After QT prolonging medications were held the QT returned to normal.  Patient did not have recurrence of PMVT and was ultimately discharged.  Patient states that after leaving the hospital she did not take any medications, including potassium supplementation.  Additionally, she did not eat.  She states that she did not eat because she did not have the energy to cook and her husband is not able to cook for them.  She did drink alcohol.  General: Well developed, in no acute distress.  Neck: No JVD.  Cardiac: Normal rate, regular rhythm.  Resp: Normal work of breathing.  Ext: No edema.  Neuro: No gross focal deficits.  Psych: Normal affect.   Assessment: Patient presented with polymorphic VT and QT prolongation in the setting of profound hypokalemia and hypomagnesemia.  This was likely secondary to poor p.o. intake and alcohol use.  She has had a previous hospitalization with PM VT in the setting of multiple QT prolonging medications.  Problem List:  #Torsades #Polymorphic VT #Hypokalemia  #Hypomagnesemia #HIV #Alcohol abuse  history #Anxiety  Plan: - Stop lidocaine .  - Leave temp wire in place overnight.  - Avoid all QT prolonging medications.  - Closely monitor electrolytes and replete as necessary.   Fonda Kitty, MD, [facility], [facility] Cardiac Electrophysiology

## 2024-03-30 NOTE — Progress Notes (Signed)
 Advanced Heart Failure Rounding Note  Cardiologist: None  Chief Complaint: Torsades Subjective:     Complaining of cough. No other events overnight.    Objective:   Weight Range: 110.6 kg Body mass index is 43.89 kg/m.   Vital Signs:   Temp:  [97.7 F (36.5 C)-99 F (37.2 C)] 98.9 F (37.2 C) (12/08 0349) Pulse Rate:  [84-113] 90 (12/08 0600) Resp:  [7-23] 14 (12/08 0600) BP: (107-164)/(62-122) 111/71 (12/08 0600) SpO2:  [91 %-100 %] 96 % (12/08 0600) FiO2 (%):  [40 %] 40 % (12/07 1224) Last BM Date :  (PTA)  Weight change: Filed Weights   03/28/24 2309 03/29/24 0500  Weight: 110.6 kg 110.6 kg    Intake/Output:   Intake/Output Summary (Last 24 hours) at 03/30/2024 0655 Last data filed at 03/30/2024 0600 Gross per 24 hour  Intake 1152.22 ml  Output 555 ml  Net 597.22 ml      Physical Exam  General:   No resp difficulty Neck: JVP difficult to assess due to body habitus.  Cor: Regular rate & rhythm.  Lungs: clear Abdomen: soft, nontender, nondistended.  Extremities: no  edema. LTVP.  R groin ecchymotic. Neuro: alert & oriented x3   Telemetry  SR . No VT.    Medications:     Scheduled Medications:  aspirin   81 mg Per Tube Daily   atorvastatin   80 mg Per Tube Daily   Chlorhexidine  Gluconate Cloth  6 each Topical Daily   dolutegravir -lamiVUDine   1 tablet Oral QPM   folic acid   1 mg Oral Daily   insulin  aspart  0-15 Units Subcutaneous Q4H   mupirocin  ointment  1 Application Nasal BID   pantoprazole  (PROTONIX ) IV  40 mg Intravenous QHS   potassium chloride   40 mEq Per Tube Daily    Infusions:  lidocaine  1 mg/min (03/28/24 2337)    PRN Medications: docusate sodium , mouth rinse, polyethylene glycol    Patient Profile   Kerston Landeck is a 43 y.o. female with morbid obesity, nocturnal hypoxia, DMII, HTN, dyslipidemia, HIV, GERD, depression/anxiety and prolonged QT (9/25 admitted with low K/Mg 2/2 ozempic ). Admitted N/V/D> polymorphic  VT/Torsades.   Assessment/Plan    Polymorphic VT in setting of QT prolongation  - Recent admission for same (third time in the past year) - While on multiple Qtc agents last time, here appears to be more driven by massive electrolyte abnormalities - EP has seen previously, agree that no role for device given that her cause has been reversible. EP consulted.  - Prior normal cath - Lidocaine  at 1. Lidocaine  level 2.8  -  Temp pacer in place - Avoid all Qtc prolonging drugs - Recommend staying off monjauro. - Suspect alcohol use significantly contributing -Consult EP. TVP hopefully out today.  - Electrolyte stabilized.    Lactic acidosis:  - ?starvation acidosis versus due to initial shock - Resolved    3. Acute hypoxic respiratory failure due to #1 -Extubated  - On 4 liters. Wean as tolerated. OOB once TVP out.  -IS every hour.   Length of Stay: 1  Bita Cartwright, NP  03/30/2024, 6:55 AM  Advanced Heart Failure Team Pager [phone] (M-F; 7a - 5p)  Please contact [facility] for night-coverage after hours (5p -7a ) and weekends on [email or web address]  CRITICAL CARE Performed by: Greig Mosses   Total critical care time: 5 minutes  Critical care time was exclusive of separately billable procedures and treating other patients.  Critical care was necessary to treat  or prevent imminent or life-threatening deterioration.  Critical care was time spent personally by me on the following activities: development of treatment plan with patient and/or surrogate as well as nursing, discussions with consultants, evaluation of patient's response to treatment, examination of patient, obtaining history from patient or surrogate, ordering and performing treatments and interventions, ordering and review of laboratory studies, ordering and review of radiographic studies, pulse oximetry and re-evaluation of patient's condition.

## 2024-03-30 NOTE — TOC Initial Note (Signed)
 Transition of Care [facility]) - Initial/Assessment Note    Patient Details  Name: Lindsay French MRN: [id number] Date of Birth: May 21, 1980  [facility] CM/SW Contact:    Justina Delcia Czar, RN Phone Number: [phone] 03/30/2024, 4:14 PM  Clinical Narrative:                 Spoke to pt and states she lives with husband. She can ambulate small distances but has scooter, knee scooter, RW, and cane at home.   Pt states she needs a sleep study, but has not received a referral to pulmonology in the past.   She is requesting a referral to see a new psychiatrist. States her previous provider sent notification to discharge from practice due to multiple missed appts/no show appts.      Expected Discharge Plan: IP Rehab Facility Barriers to Discharge: Continued Medical Work up   Patient Goals and CMS Choice Patient states their goals for this hospitalization and ongoing recovery are:: wants to recover       Expected Discharge Plan and Services   Discharge Planning Services: CM Consult   Living arrangements for the past 2 months: Single Family Home                                      Prior Living Arrangements/Services Living arrangements for the past 2 months: Single Family Home Lives with:: Spouse Patient language and need for interpreter reviewed:: Yes Do you feel safe going back to the place where you live?: Yes      Need for Family Participation in Patient Care: Yes (Comment) Care giver support system in place?: Yes (comment) Current home services: DME (scooter, oxygen, RW, cane) Criminal Activity/Legal Involvement Pertinent to Current Situation/Hospitalization: No - Comment as needed  Activities of Daily Living      Permission Sought/Granted Permission sought to share information with : Case Manager, Family Supports, PCP Permission granted to share information with : Yes, Verbal Permission Granted  Share Information with NAME: Kimyetta Flott  Permission granted to share info w AGENCY: Home Health, PCP, DME  Permission granted to share info w Relationship: husband  Permission granted to share info w Contact Information: [phone]  Emotional Assessment Appearance:: Appears stated age Attitude/Demeanor/Rapport: Engaged Affect (typically observed): Accepting Orientation: : Oriented to Self, Oriented to Place, Oriented to  Time, Oriented to Situation   Psych Involvement: No (comment)  Admission diagnosis:  Electrolyte disturbance [E87.8] Cardiac arrhythmia, unspecified cardiac arrhythmia type [I49.9] Torsades de pointes (HCC) [I47.21] Patient Active Problem List   Diagnosis Date Noted   Electrolyte disturbance 03/30/2024   Recurrent major depressive disorder 03/30/2024   Diabetes mellitus (HCC) 03/23/2024   Torsades de pointes (HCC) 03/19/2024   Dysuria 01/22/2024   Hypomagnesemia 01/09/2024   Prolonged QT interval 01/09/2024   Abdominal pain 01/09/2024   Hypokalemia 01/09/2024   Anxiety state    Acute gastric ulcer with hemorrhage 06/22/2023   Gastritis and gastroduodenitis 06/22/2023   Upper GI bleed 06/21/2023   Morbid obesity (HCC) 06/21/2023   Closed fracture dislocation foot with nonunion 06/21/2023   Iron  deficiency anemia due to chronic blood loss 06/21/2023   Melena 06/21/2023   Lisfranc dislocation, left, initial encounter 05/26/2023   Diabetic foot infection (HCC) 04/30/2023   Insomnia 04/30/2023   Thrush 06/12/2022   Anxiety and depression 01/29/2022   At risk for heart disease 09/20/2021   Folate  deficiency 08/25/2021   Vitamin D  deficiency 08/25/2021   Hyperlipidemia associated with type 2 diabetes mellitus (HCC) 08/25/2021   Type 2 diabetes mellitus with hyperglycemia, without long-term current use of insulin  (HCC) 08/25/2021   Hypertension associated with type 2 diabetes mellitus (HCC) 08/25/2021   At risk for hyperglycemia 08/25/2021   Dental infection 10/21/2020   Left upper extremity  swelling 10/21/2020   Intractable nausea and vomiting 10/12/2020   Chronic narcotic use 08/30/2020   Arthralgia of left lower leg    Cellulitis of left leg    Mild protein-calorie malnutrition    Hypothyroidism    GERD (gastroesophageal reflux disease)    Hepatic steatosis    Thrombocytosis    Macrocytic anemia    Back pain 03/24/2019   Infertility, female 03/24/2019   PCOS (polycystic ovarian syndrome) 03/24/2019   HIV disease (HCC) 04/24/2018   Health care maintenance 04/24/2018   Hyperlipidemia 04/10/2017   Acute respiratory failure with hypoxia (HCC) 04/16/2016   Acute bronchitis 04/16/2016   Hypertension 04/16/2016   Class 3 obesity (HCC) 03/21/2016   Screening examination for venereal disease 03/30/2014   Encounter for long-term (current) use of medications 03/30/2014   Idiopathic thrombocytopenic purpura (ITP) (HCC) 11/14/2010   ADHD (attention deficit hyperactivity disorder) 11/14/2010   PCP:  Eben Reyes BROCKS, MD Pharmacy:   [facility] [phone] - [place], [place] - [address] AT [facility] [address] [place] [address] Phone: [phone] Fax: [phone]  [facility] [address] [place] [address] Phone: [phone] Fax: [phone]  [facility] [address] [place] [address] Phone: [phone] Fax: [phone]     Social Drivers of Health (SDOH) Social History: SDOH Screenings   Food Insecurity: No Food Insecurity (03/26/2024)  Housing: Unknown (03/26/2024)  Transportation Needs: No Transportation Needs (03/26/2024)  Utilities: Not At Risk (03/26/2024)  Alcohol Screen: Low Risk  (07/31/2022)  Depression (PHQ2-9): High Risk (01/22/2024)  Financial Resource Strain: Low Risk  (07/31/2022)  Physical Activity: Sufficiently Active (07/31/2022)  Social Connections: Moderately Integrated (07/31/2022)  Recent Concern: Social  Connections - Moderately Isolated (06/12/2022)  Stress: No Stress Concern Present (07/31/2022)  Tobacco Use: Medium Risk (03/28/2024)   SDOH Interventions:     Readmission Risk Interventions     No data to display

## 2024-03-30 NOTE — Progress Notes (Signed)
 eLink Physician-Brief Progress Note Patient Name: Lindsay French DOB: Jun 24, 1980 MRN: [id number]   Date of Service  03/30/2024  HPI/Events of Note  Pt complaining of anxiety.   eICU Interventions  Placed order for xanax  1mg  PO TID PRN, which is her home medication.  Will continue to monitor closely.         [name] 03/30/2024, 9:00 PM

## 2024-03-31 ENCOUNTER — Encounter: Admitting: Infectious Diseases

## 2024-03-31 ENCOUNTER — Telehealth: Payer: Self-pay | Admitting: *Deleted

## 2024-03-31 ENCOUNTER — Inpatient Hospital Stay (HOSPITAL_COMMUNITY)

## 2024-03-31 LAB — CBC
HCT: 33.3 % — ABNORMAL LOW (ref 36.0–46.0)
Hemoglobin: 10.8 g/dL — ABNORMAL LOW (ref 12.0–15.0)
MCH: 34.1 pg — ABNORMAL HIGH (ref 26.0–34.0)
MCHC: 32.4 g/dL (ref 30.0–36.0)
MCV: 105 fL — ABNORMAL HIGH (ref 80.0–100.0)
Platelets: 221 K/uL (ref 150–400)
RBC: 3.17 MIL/uL — ABNORMAL LOW (ref 3.87–5.11)
RDW: 13 % (ref 11.5–15.5)
WBC: 12.2 K/uL — ABNORMAL HIGH (ref 4.0–10.5)
nRBC: 0 % (ref 0.0–0.2)

## 2024-03-31 LAB — RENAL FUNCTION PANEL
Albumin: 2.6 g/dL — ABNORMAL LOW (ref 3.5–5.0)
Anion gap: 8 (ref 5–15)
BUN: <5 mg/dL — ABNORMAL LOW (ref 6–20)
CO2: 25 mmol/L (ref 22–32)
Calcium: 8.4 mg/dL — ABNORMAL LOW (ref 8.9–10.3)
Chloride: 100 mmol/L (ref 98–111)
Creatinine, Ser: 0.45 mg/dL (ref 0.44–1.00)
GFR, Estimated: >60 mL/min (ref >60)
Glucose, Bld: 94 mg/dL (ref 70–99)
Phosphorus: 1.7 mg/dL — ABNORMAL LOW (ref 2.5–4.6)
Potassium: 5 mmol/L (ref 3.5–5.1)
Sodium: 133 mmol/L — ABNORMAL LOW (ref 135–145)

## 2024-03-31 LAB — GLUCOSE, CAPILLARY
Glucose-Capillary: 113 mg/dL — ABNORMAL HIGH (ref 70–99)
Glucose-Capillary: 124 mg/dL — ABNORMAL HIGH (ref 70–99)
Glucose-Capillary: 87 mg/dL (ref 70–99)
Glucose-Capillary: 90 mg/dL (ref 70–99)
Glucose-Capillary: 92 mg/dL (ref 70–99)

## 2024-03-31 LAB — MAGNESIUM: Magnesium: 1.7 mg/dL (ref 1.7–2.4)

## 2024-03-31 MED ORDER — GADOBUTROL 1 MMOL/ML IV SOLN
10.0000 mL | Freq: Once | INTRAVENOUS | Status: AC | PRN
  Administered 2024-03-31: 10 mL via INTRAVENOUS

## 2024-03-31 MED ORDER — SORBITOL 70 % SOLN
60.0000 mL | Freq: Once | Status: DC
  Filled 2024-03-31: qty 60

## 2024-03-31 MED ORDER — SENNA 8.6 MG PO TABS: 1.0000 | ORAL_TABLET | Freq: Every day | ORAL | Status: DC

## 2024-03-31 MED ORDER — ENOXAPARIN SODIUM 60 MG/0.6ML IJ SOSY
50.0000 mg | PREFILLED_SYRINGE | INTRAMUSCULAR | Status: DC
  Administered 2024-03-31: 50 mg via SUBCUTANEOUS
  Filled 2024-03-31 (×2): qty 0.6

## 2024-03-31 MED ORDER — NADOLOL 40 MG PO TABS
60.0000 mg | ORAL_TABLET | Freq: Every day | ORAL | Status: DC
  Administered 2024-03-31 – 2024-04-01 (×2): 60 mg via ORAL
  Filled 2024-03-31 (×3): qty 1

## 2024-03-31 MED ORDER — POLYETHYLENE GLYCOL 3350 17 G PO PACK: 17.0000 g | PACK | Freq: Every day | ORAL | Status: DC

## 2024-03-31 MED ORDER — SENNOSIDES-DOCUSATE SODIUM 8.6-50 MG PO TABS
2.0000 | ORAL_TABLET | Freq: Two times a day (BID) | ORAL | Status: DC
  Filled 2024-03-31: qty 2

## 2024-03-31 MED ORDER — POLYETHYLENE GLYCOL 3350 17 G PO PACK
17.0000 g | PACK | Freq: Two times a day (BID) | ORAL | Status: DC
  Filled 2024-03-31: qty 1

## 2024-03-31 MED ORDER — MAGNESIUM SULFATE 2 GM/50ML IV SOLN
2.0000 g | Freq: Once | INTRAVENOUS | Status: AC
  Administered 2024-03-31: 2 g via INTRAVENOUS
  Filled 2024-03-31: qty 50

## 2024-03-31 MED ORDER — ACETAMINOPHEN 325 MG PO TABS
650.0000 mg | ORAL_TABLET | Freq: Four times a day (QID) | ORAL | Status: DC | PRN
  Administered 2024-03-31: 650 mg via ORAL
  Filled 2024-03-31: qty 2

## 2024-03-31 MED ORDER — POTASSIUM & SODIUM PHOSPHATES 280-160-250 MG PO PACK
2.0000 | PACK | Freq: Once | ORAL | Status: AC
  Administered 2024-03-31: 2 via ORAL
  Filled 2024-03-31: qty 2

## 2024-03-31 NOTE — Telephone Encounter (Signed)
 Christian, This patient has never been seen in our office, they need to call the office to schedule a consult visit.  We cannot order anything until she is seen in the office.

## 2024-03-31 NOTE — Consult Note (Signed)
 [facility] Psychiatric Consult Initial  Patient Name: Lindsay French  MRN: [id number]  DOB: October 30, 1980  Consult Order details:  Orders (From admission, onward)     Start     Ordered   03/30/24 1443  IP CONSULT TO PSYCHIATRY       Ordering Provider: Eben Reyes BROCKS, MD  Provider:  (Not yet assigned)  Question Answer Comment  Location [facility]   Reason for Consult? anxiety, depression      03/30/24 1442            Mode of Visit: In person   Psychiatry Consult Evaluation  Service Date: March 31, 2024 LOS:  LOS: 2 days  Chief Complaint anxiety  Primary Psychiatric Diagnoses  Generalized anxiety disorder Major depressive disorder, moderate 2.  ADHD by history  Assessment  Lindsay French is a 43 y.o. female admitted medically on 03/28/2024 11:02 PM for chest pain and shortness of breath and was found to have runs of NSVT mostly PM, bigeminy and early signs of potential TdP conversion. This is her third admission for polymorphic Vtach. She carries the psychiatric diagnoses of GAD, MDD, ADHD and has a past medical history of  morbid obesity, nocturnal hypoxia on HOT (?OSA), DM-2, HTN, dyslipidemia, hypomagnesemia, HIV on HAART, GERD, depression/anxiety, and prolonged QT c/v TdP in in late 02/2024 due to hypokalemia and hypomagnesemia.  Patient's current presentation of rumination, excessive worry, restlessness is most consistent with generalized anxiety disorder. Patient also endorses a history of recurrent panic attacks in which she experiences shortness of breath, nausea, and tachycardia, which have prevented her from leaving the house in the past for an extended period of time, most consistent with panic disorder. Patient has a history of major depressive disorder, and reports ongoing insomnia and low mood. She does not meet criteria for inpatient hospitalization as there is no acute safety risk. Current outpatient psychotropic medications include Xanax  and  historically she has had a good response to this medication. Patient reports previously taking Prozac  and Lithium , however these were discontinued out of concern for the risk of QT prolongation given patient's history of recurrent Torsades. She is currently prescribed Zoloft , and reports limited efficacy, though patient was reportedly not taking regularly.  On initial examination, patient is very anxious. She quickly becomes tearful and increasingly distressed when describing an upcoming plane trip to [place] . Patient repeatedly asserts that she needs Xanax  refilled before her flight next week, as she will be too anxious to fly. She has been on Xanax  for several years and states Xanax  has been the only medication that helps control her anxiety. She denies suicidal or homicidal thoughts. Per chart review, it appears patient last refilled a 15-day supply of Xanax  on 02/19/2024. Patient denies alcohol use, however per chart review patient had admitted to taking 1-2 shots per day. She appears to be pre-contemplative regarding her alcohol use. Patient states she is hoping to be  provided with a Xanax  refill at discharge for her upcoming trip. Patient also requesting to be connected with an outpatient psychiatrist.  We will continue to follow. Will attempt to reach out to patient's outpatient provider for further clarification regarding her medication regimen, psych history and make additional changes if indicated from there.  Please see plan below for detailed recommendations.   Diagnoses:  Active Hospital problems: Principal Problem:   Torsades [facility]) Active Problems:   Anxiety state   Electrolyte disturbance   Recurrent major depressive disorder   Cardiac arrhythmia  Gastroparesis   HIV infection (HCC)    Plan   ## Psychiatric Medication Recommendations:  -- Continue Xanax  1 mg TID as needed  ## Medical Decision Making Capacity: Not specifically addressed in this encounter  ##  Further Work-up:  -- Per primary team -- While pt on Qtc prolonging medications, please monitor & replete K+ to 4 and Mg2+ to 2 -- most recent EKG on 12/8 had QtC of 461 -- Pertinent labwork reviewed earlier this admission includes: CBC, Mg, CMP, Phos  ## Disposition:-- There are no psychiatric contraindications to discharge at this time  ## Behavioral / Environmental: -Utilize compassion and acknowledge the patient's experiences while setting clear and realistic expectations for care.    ## Safety and Observation Level:  - Based on my clinical evaluation, I estimate the patient to be at low risk of self harm in the current setting. - At this time, we recommend  routine observation. This decision is based on my review of the chart including patient's history and current presentation, interview of the patient, mental status examination, and consideration of suicide risk including evaluating suicidal ideation, plan, intent, suicidal or self-harm behaviors, risk factors, and protective factors. This judgment is based on our ability to directly address suicide risk, implement suicide prevention strategies, and develop a safety plan while the patient is in the clinical setting. Please contact our team if there is a concern that risk level has changed.  CSSR Risk Category:C-SSRS RISK CATEGORY: No Risk  Suicide Risk Assessment: Patient has following modifiable risk factors for suicide: under treated depression , active mental illness (to encompass adhd, tbi, mania, psychosis, trauma reaction), current symptoms: anxiety/panic, insomnia, impulsivity, anhedonia, hopelessness, and pain, medical illness (ie new dx of cancer) Patient has following non-modifiable or demographic risk factors for suicide: psychiatric hospitalization Patient has the following protective factors against suicide: Supportive family, no history of suicide attempts, and no history of NSSIB  Thank you for this consult request.  Recommendations have been communicated to the primary team.  We will follow at this time.   Ashley LOISE Gravely, MD      History of Present Illness  Relevant Aspects of Hospital Course:  Admitted on 03/28/2024 for chest pain and shortness of breath and was found to have runs of NSVT mostly PM, bigeminy and early signs of potential TdP conversion. On workup she was noted to have magnesium  of 0.7, serum potassium of 2.4 and lactate of 7.9. She was started on IV lidocaine  infusion and was given IV Ativan  with good decrease in mental status she was intubated and was admitted to ICU. She received aggressive electrolyte replacement and was on lidocaine  infusion at 1 mg. She was stabilized and subsequently extubated. She initially denies any ETOH but upon further questioning, she admits to ongoing 1-2 shots of ETOH per day. Per primary, they suspect ETOH abuse as primary driver of recurrent TdP along with N/V from [facility] .  Initial Patient Report:   On initial evaluation, patient reports she has a long history of depression dating back to her teenage years.  Reports she moved from [place]  to [place]  in her early teens and began experiencing insomnia, vivid dreams, and severe anxiety and depression. States she was diagnosed with bipolar disorder at 43 years old, but does not agree with this diagnosis.  She denies any past history of mania. She also reports she was diagnosed with ADHD as an adult, alongside autism and cPTSD, although she has never been formally diagnosed with these. She  states she has been off and on antidepressants and anxiety meds since she was a teenager. Describes periods of severe anxiety and panic attacks in which she has shortness of breath, nausea, diarrhea, and ruminating thoughts that lasts for hours at a time.  Describes feeling restless and overwhelmed with completing daily tasks.  Describes feeling excessive worry, which prevents her from normal social functioning.  States she  did not leave her house for 78 days at one time due to fear of having another panic attack in public.   Reports on Thanksgiving day she had what she thought was a panic attack, but after noticing her symptoms did not resolve, she presented to the hospital and was found to be in polymorphic ventricular tachycardia.  She was put in a medically induced coma for 24 hours.  At that time, she was taken off of her outpatient psychiatric medications including Prozac  and lithium .  She reports she met with her PCP and he encouraged her to try to get off of Xanax .  Patient states Xanax  has been the only medication that has helped control her anxiety.  States she has tried several coping strategies and meds to manage anxiety but none of them have been effective.  Previous med trials include: Abilify, Rexulti, BuSpar , doxepin, hydroxyzine , propranolol, Vilazodone .  States most recently she was prescribed Zoloft  but does not believe it is working, although she has not been taking it regularly.  Patient states she has an upcoming trip to [place]  and would like to have Xanax  to help control anxiety on the plane ride.  Patient reports she got down to a dose of 0.5 mg Xanax  per day when she was taking both Prozac  and lithium  together, however feels like she needs more Xanax  now that she is no longer taking Prozac  and lithium .  She reports 5 previous psychiatric hospitalizations between the ages of [age].  Denies previous suicide attempts or self-injurious behavior.  Reports she had bulimia in her early 21s but is now resolved.  Denies AVH.  Reports experiencing nightmares and flashbacks from living in [place] during 9[place]11.  Patient denies substance use, however per chart review patient reported drinking 1-2 shots daily.  Psych ROS:  Depression: Yes Anxiety:  Yes Mania (lifetime and current): No Psychosis: (lifetime and current): No  Review of Systems  Gastrointestinal:  Negative for abdominal pain, constipation,  diarrhea, nausea and vomiting.  Neurological:  Negative for dizziness and headaches.  All other systems reviewed and are negative.    Psychiatric and Social History  Psychiatric History: Information collected from patient and chart review  Prev Dx/Sx: GAD, MDD, ADHD Current Psych Provider: Augustin Prader, PA Home Meds (current): most recently Xanax , Lithium , Prozac   Previous Med Trials: Abilify, Rexulti, BuSpar , doxepin, hydroxyzine , propranolol, vilazodone . Therapy: Unknown  Prior Psych Hospitalization: 5 prior hospitalizations between ages of [age] for depression  Prior Self Harm: Denies Prior Violence: Denies  Family Psych History: Mother - depression; father - depression Family Hx suicide: Denies  Social History:  Patient is married and lives with her husband.  Patient owns a daycare.  Substance History Alcohol: reports drinking 1-2 shots per day   Exam Findings  Vital Signs:  Temp:  [98.4 F (36.9 C)-99.1 F (37.3 C)] 99.1 F (37.3 C) (12/09 1205) Pulse Rate:  [62-82] 74 (12/09 1015) Resp:  [10-21] 13 (12/09 1015) BP: (97-142)/(56-97) 110/92 (12/09 1015) SpO2:  [95 %-100 %] 97 % (12/09 1015) Weight:  [113.9 kg] 113.9 kg (12/09 0500) Blood pressure ROLLEN)  110/92, pulse 74, temperature 99.1 F (37.3 C), temperature source Oral, resp. rate 13, height 5' 2.5 (1.588 m), weight 113.9 kg, SpO2 97%. Body mass index is 45.2 kg/m.  Physical Exam Vitals and nursing note reviewed.  Constitutional:      General: She is not in acute distress.    Appearance: Normal appearance. She is obese. She is not ill-appearing.  HENT:     Head: Normocephalic and atraumatic.  Pulmonary:     Effort: Pulmonary effort is normal. No respiratory distress.  Neurological:     General: No focal deficit present.     Mental Status: She is alert and oriented to person, place, and time. Mental status is at baseline.  Psychiatric:        Mood and Affect: Mood is anxious.        Behavior: Behavior  normal.    Mental Status Exam: General Appearance: Casual and Fairly Groomed  Orientation:  Full (Time, Place, and Person)  Memory:  Immediate;   Good Recent;   Good  Concentration:  Concentration: Good and Attention Span: Good  Recall:  Good  Attention  Good  Eye Contact:  Good  Speech:  Clear and Coherent and Normal Rate  Language:  Good  Volume:  Normal  Mood: Dysthymic, tearful  Affect:  Tearful  Thought Process:  Coherent, Goal Directed, and Linear  Thought Content:  Rumination  Suicidal Thoughts:  No  Homicidal Thoughts:  No  Judgement:  Fair  Insight:  Fair  Psychomotor Activity:  Normal  Akathisia:  No  Fund of Knowledge:  Good   Assets:  Communication Skills Housing Resilience Social Support  Cognition:  WNL  ADL's:  Intact  AIMS (if indicated):      Other History   These have been pulled in through the EMR, reviewed, and updated if appropriate.  Family History:  [name] family history includes Depression in her father and mother; Hyperlipidemia in her father; Hypertension in her father; Hypothyroidism in her mother; Obesity in her father and mother; Thyroid disease in her mother.  Medical History: Past Medical History:  Diagnosis Date   ADHD    Anxiety    Asthma    usually with colds   Avascular necrosis (HCC)    Back pain    Depression    Diabetes mellitus without complication (HCC)    Elevated LFTs    Fatty liver 04/16/2016   Noted US  ABD   GERD (gastroesophageal reflux disease)    Heart murmur    at birth, no issues   History of heat stroke    with syncope   History of thrombocytopenia    prior to diagnosis HIV   HIV (human immunodeficiency virus infection) (HCC)    Hyperlipidemia    Hypertension    Hypothyroidism    Infertility, female    Obese    PCOS (polycystic ovarian syndrome)    Pneumonia 04/17/2016   SOB (shortness of breath)    Wears partial dentures    upper   Wrist fracture 11/2017   Left    Surgical  History: Past Surgical History:  Procedure Laterality Date   BIOPSY  06/22/2023   Procedure: BIOPSY;  Surgeon: Shila Gustav GAILS, MD;  Location: [facility];  Service: Gastroenterology;;   ESOPHAGOGASTRODUODENOSCOPY (EGD) WITH PROPOFOL  N/A 06/22/2023   Procedure: ESOPHAGOGASTRODUODENOSCOPY (EGD) WITH PROPOFOL ;  Surgeon: Shila Gustav GAILS, MD;  Location: [facility];  Service: Gastroenterology;  Laterality: N/A;   HYSTEROSCOPY N/A 12/31/2017   Procedure: HYSTEROSCOPY and  polypectomy;  Surgeon: Yalcinkaya, Tamer, MD;  Location: [facility];  Service: Gynecology;  Laterality: N/A;   I & D EXTREMITY Left 07/15/2020   Procedure: PARTIAL EXCISION LEFT TIBIA;  Surgeon: Harden Jerona GAILS, MD;  Location: [facility];  Service: Orthopedics;  Laterality: Left;   IR FLUORO GUIDE CV LINE LEFT  09/26/2020   IR RADIOLOGIST EVAL & MGMT  08/12/2020   maxillofacial surgery     OPEN REDUCTION INTERNAL FIXATION (ORIF) FOOT LISFRANC FRACTURE Left 06/11/2023   Procedure: OPEN TREAMENT OF LEFT FIRST, SECOND, FOURTH METATARSALS, MEDIAL CUNEIFORM FRACTURE, CUBOID, FIRST SECOND THRID TARSOMETATARSAL AND LISFRANC JOINT;  Surgeon: Elsa Lonni SAUNDERS, MD;  Location: [facility] OR;  Service: Orthopedics;  Laterality: Left;   RIGHT/LEFT HEART CATH AND CORONARY ANGIOGRAPHY N/A 03/19/2024   Procedure: RIGHT/LEFT HEART CATH AND CORONARY ANGIOGRAPHY;  Surgeon: Anner Alm ORN, MD;  Location: [facility] INVASIVE CV LAB;  Service: Cardiovascular;  Laterality: N/A;   TEMPORARY PACEMAKER N/A 03/19/2024   Procedure: TEMPORARY PACEMAKER;  Surgeon: Anner Alm ORN, MD;  Location: [facility] INVASIVE CV LAB;  Service: Cardiovascular;  Laterality: N/A;   TEMPORARY PACEMAKER N/A 03/29/2024   Procedure: TEMPORARY PACEMAKER;  Surgeon: Jordan, Peter M, MD;  Location: [facility] INVASIVE CV LAB;  Service: Cardiovascular;  Laterality: N/A;   Medications:   Current Facility-Administered Medications:    acetaminophen  (TYLENOL ) tablet 650 mg, 650 mg, Oral, Q6H PRN,  Stoner, Benjamin J, MD, 650 mg at 03/31/24 0940   ALPRAZolam  (XANAX ) tablet 1 mg, 1 mg, Oral, TID PRN, Dela Cruz, Anton M, MD, 1 mg at 03/31/24 0940   aspirin  EC tablet 81 mg, 81 mg, Oral, Daily, Paytes, Maurilio PENNER, RPH, 81 mg at 03/31/24 0941   atorvastatin  (LIPITOR ) tablet 80 mg, 80 mg, Oral, Daily, Paytes, Emma U, RPH, 80 mg at 03/31/24 0941   Chlorhexidine  Gluconate Cloth 2 % PADS 6 each, 6 each, Topical, Daily, Jordan, Peter M, MD, 6 each at 03/31/24 0941   dolutegravir -lamiVUDine  ([name] ) 50-300 MG per tablet 1 tablet, 1 tablet, Oral, QPM, Jordan, Peter M, MD, 1 tablet at 03/30/24 1748   enoxaparin  (LOVENOX ) injection 50 mg, 50 mg, Subcutaneous, Q24H, Zenaida Morene PARAS, MD   folic acid  (FOLVITE ) tablet 1 mg, 1 mg, Oral, Daily, Jordan, Peter M, MD, 1 mg at 03/31/24 0941   insulin  aspart (novoLOG ) injection 0-15 Units, 0-15 Units, Subcutaneous, Q4H, Chand, Sudham, MD, 2 Units at 03/31/24 0026   mupirocin  ointment (BACTROBAN ) 2 % 1 Application, 1 Application, Nasal, BID, Debarah Donley SAILOR, MD, 1 Application at 03/31/24 0941   nadolol  (CORGARD ) tablet 60 mg, 60 mg, Oral, Daily, Ollis, Brandi L, NP, 60 mg at 03/31/24 0940   Oral care mouth rinse, 15 mL, Mouth Rinse, PRN, Zenaida Morene PARAS, MD   pantoprazole  (PROTONIX ) injection 40 mg, 40 mg, Intravenous, QHS, Chand, Sudham, MD, 40 mg at 03/30/24 2115   polyethylene glycol (MIRALAX  / GLYCOLAX ) packet 17 g, 17 g, Oral, BID, Zenaida Morene PARAS, MD, 17 g at 03/31/24 0941   senna-docusate (Senokot-S) tablet 2 tablet, 2 tablet, Oral, BID, Zenaida Morene PARAS, MD, 2 tablet at 03/31/24 0941  Allergies: Allergies  Allergen Reactions   Citalopram Hydrobromide Other (See Comments)    Felt weird 04/2021 Admission for pVT/torsades 03/2024    Fluconazole  Other (See Comments)    Required hospitalization for VT/torsades > would not restart     Lithium  Other (See Comments)    Required hospitalization for VT/torsades 03/2024 > would not restart     Cephalexin   Other (See Comments)    Burning face   Mucinex  Fast-Max Chest Cong Ms [Guaifenesin ] Nausea And Vomiting   Ancef  [Cefazolin ] Rash   Hydrocodone  Itching   Latex Other (See Comments)    As a teenager, when using latex condoms, she would get a yeast infection.   Lisinopril Cough   Ashley LOISE Gravely, MD

## 2024-03-31 NOTE — Progress Notes (Signed)
 IMTS to assume care of this patient 04/01/2024 at 0700. Dr. Shawn attending.

## 2024-03-31 NOTE — Evaluation (Signed)
 Physical Therapy Evaluation Patient Details Name: Lindsay French MRN: [id number] DOB: September 23, 1980 Today's Date: 03/31/2024  History of Present Illness  43 y.o. female presents to [facility] hospital on 03/28/2024 with chest pain and SOB. Pt was found to be in NSVT in the setting of hypomagnesemia/hypokalemia. Pt was intubated and taken to cath lab for TVP. Extubated 12/7. PMH includes nocturnal hypoxia, morbid obesity (BMI 45), DM II, HTN, dyslipidemia, HIV, GERD, anxiety/depression.   Clinical Impression  Pt in bed upon arrival of PT, agreeable to evaluation at this time. Since last admission the pt reports she was ambulating with use of RW in the home, limited to short distances and only tolerating 30-45 min standing per day due to pain in L foot since fx in Feb. The pt was able to complete initial bed mobility and transfers with minA to CGA, and progressed to supervision with use of RW for 30 ft ambulation in the room. The pt will continue to benefit from skilled PT acutely to progress functional independence, LE exercises, and wean from DME use. Anticipate she can return home and resume HHPT that was recommended on recent d/c.      If plan is discharge home, recommend the following: A little help with bathing/dressing/bathroom;Assist for transportation   Can travel by private vehicle        Equipment Recommendations None recommended by PT  Recommendations for Other Services       Functional Status Assessment Patient has had a recent decline in their functional status and demonstrates the ability to make significant improvements in function in a reasonable and predictable amount of time.     Precautions / Restrictions Precautions Precautions: Fall Recall of Precautions/Restrictions: Intact Precaution/Restrictions Comments: watch hypotension Restrictions Weight Bearing Restrictions Per Provider Order: No      Mobility  Bed Mobility Overal bed mobility: Needs Assistance Bed Mobility: Supine  to Sit     Supine to sit: Min assist     General bed mobility comments: minA to pull up on therapist hands to reach long-sitting then supervision to reach EOB    Transfers Overall transfer level: Needs assistance Equipment used: None, Rolling walker (2 wheels) Transfers: Sit to/from Stand Sit to Stand: Contact guard assist           General transfer comment: CGA with cues for hand placement on armrests    Ambulation/Gait Ambulation/Gait assistance: Contact guard assist, Supervision Gait Distance (Feet): 30 Feet Assistive device: Rolling walker (2 wheels) Gait Pattern/deviations: Step-through pattern, Decreased stride length Gait velocity: reduced Gait velocity interpretation: <1.31 ft/sec, indicative of household ambulator   General Gait Details: Pt demonstrates reciprocal gait pattern w/ reduced knee flexion and ankle DF bilaterally, heady dependence on BUE support, VSS    Balance Overall balance assessment: Needs assistance Sitting-balance support: No upper extremity supported, Feet supported Sitting balance-Leahy Scale: Normal Sitting balance - Comments: seated EOB   Standing balance support: No upper extremity supported, During functional activity Standing balance-Leahy Scale: Fair Standing balance comment: BUE support for gait, can static stand without UE support                             Pertinent Vitals/Pain Pain Assessment Pain Assessment: No/denies pain Faces Pain Scale: Hurts little more Pain Location: catheter Pain Descriptors / Indicators: Discomfort Pain Intervention(s): Limited activity within patient's tolerance, Monitored during session, Repositioned    Home Living Family/patient expects to be discharged to:: Private residence Living Arrangements: Spouse/significant other (  husband) Available Help at Discharge: Family (spouse unable to assist due to back pain; does have neighbors who could help if needed) Type of Home: House Home  Access: Level entry       Home Layout: One level Home Equipment: Tub bench;Rolling Walker (2 wheels);BSC/3in1;Other (comment);Hand held shower head (knee scooter, bidet)      Prior Function Prior Level of Function : Needs assist;Driving;Working/employed             Mobility Comments: RW in the home, has not left house since d/c on 12/1. reports no falls, limited to 30-45 min standing on L foot ADLs Comments: ind, spouse assists with med mgmt, works part time as a home daycare     Extremity/Trunk Assessment   Upper Extremity Assessment Upper Extremity Assessment: Overall WFL for tasks assessed    Lower Extremity Assessment Lower Extremity Assessment: Overall WFL for tasks assessed    Cervical / Trunk Assessment Cervical / Trunk Assessment: Kyphotic  Communication   Communication Communication: No apparent difficulties    Cognition Arousal: Alert Behavior During Therapy: WFL for tasks assessed/performed   PT - Cognitive impairments: No apparent impairments                       PT - Cognition Comments: no anxiety in session, able to follow all commands but does report feeling a little groggy Following commands: Intact       Cueing Cueing Techniques: Verbal cues     General Comments General comments (skin integrity, edema, etc.): BP soft with MAP 61 when supine prior to session, improved with changes in position to standing and gait. SpO2 90-94% on RA    Exercises     Assessment/Plan    PT Assessment Patient needs continued PT services  PT Problem List Decreased activity tolerance;Decreased balance;Decreased mobility;Pain       PT Treatment Interventions Gait training;Stair training;Functional mobility training;Therapeutic activities;Therapeutic exercise;Balance training;Wheelchair mobility training;Patient/family education    PT Goals (Current goals can be found in the Care Plan section)  Acute Rehab PT Goals Patient Stated Goal: to go home PT  Goal Formulation: With patient Time For Goal Achievement: 04/14/24 Potential to Achieve Goals: Good    Frequency Min 2X/week        AM-PAC PT 6 Clicks Mobility  Outcome Measure Help needed turning from your back to your side while in a flat bed without using bedrails?: None Help needed moving from lying on your back to sitting on the side of a flat bed without using bedrails?: A Little Help needed moving to and from a bed to a chair (including a wheelchair)?: A Little Help needed standing up from a chair using your arms (e.g., wheelchair or bedside chair)?: A Little Help needed to walk in hospital room?: A Little Help needed climbing 3-5 steps with a railing? : A Lot 6 Click Score: 18    End of Session Equipment Utilized During Treatment: Gait belt Activity Tolerance: Patient tolerated treatment well Patient left: with call bell/phone within reach;in chair;with chair alarm set Nurse Communication: Mobility status (pt desire for catheter removal) PT Visit Diagnosis: Other abnormalities of gait and mobility (R26.89);Muscle weakness (generalized) (M62.81)    Time: 8592-8564 PT Time Calculation (min) (ACUTE ONLY): 28 min   Charges:   PT Evaluation $PT Eval Moderate Complexity: 1 Mod PT Treatments $Therapeutic Exercise: 8-22 mins PT General Charges $$ ACUTE PT VISIT: 1 Visit         Izetta Call, PT, DPT  Acute Rehabilitation Department Office [phone] Secure Chat Communication Preferred  Izetta JULIANNA Call 03/31/2024, 3:17 PM

## 2024-03-31 NOTE — Progress Notes (Addendum)
 Advanced Heart Failure Rounding Note  Cardiologist: None  Chief Complaint: Torsades Subjective:    No complaints over night.      Objective:   Weight Range: 113.9 kg Body mass index is 45.2 kg/m.   Vital Signs:   Temp:  [98.2 F (36.8 C)-98.8 F (37.1 C)] 98.7 F (37.1 C) (12/09 0400) Pulse Rate:  [62-90] 72 (12/09 0700) Resp:  [10-22] 17 (12/09 0700) BP: (94-142)/(43-116) 117/74 (12/09 0700) SpO2:  [89 %-100 %] 100 % (12/09 0700) Weight:  [113.9 kg] 113.9 kg (12/09 0500) Last BM Date :  (PTA)  Weight change: Filed Weights   03/29/24 0500 03/30/24 0500 03/31/24 0500  Weight: 110.6 kg 116.3 kg 113.9 kg    Intake/Output:   Intake/Output Summary (Last 24 hours) at 03/31/2024 0758 Last data filed at 03/31/2024 0600 Gross per 24 hour  Intake 500.12 ml  Output 2805 ml  Net -2304.88 ml      Physical Exam  General:   No resp difficulty Neck: no JVD.  Cor: Regular rate & rhythm. L TVP  Lungs: clear Abdomen: soft, nontender, nondistended.  Extremities: no  edema Neuro: alert & oriented x3    Telemetry  SR 70s    Medications:     Scheduled Medications:  aspirin  EC  81 mg Oral Daily   atorvastatin   80 mg Oral Daily   Chlorhexidine  Gluconate Cloth  6 each Topical Daily   dolutegravir -lamiVUDine   1 tablet Oral QPM   folic acid   1 mg Oral Daily   insulin  aspart  0-15 Units Subcutaneous Q4H   mupirocin  ointment  1 Application Nasal BID   nadolol   60 mg Oral Daily   pantoprazole  (PROTONIX ) IV  40 mg Intravenous QHS   potassium chloride   40 mEq Oral Daily    Infusions:  sodium chloride  10 mL/hr at 03/31/24 0600    PRN Medications: sodium chloride , ALPRAZolam , docusate sodium , mouth rinse, polyethylene glycol    Patient Profile   Lindsay French is a 43 y.o. female with morbid obesity, nocturnal hypoxia, DMII, HTN, dyslipidemia, HIV, GERD, depression/anxiety and prolonged QT (9/25 admitted with low K/Mg 2/2 ozempic ). Admitted N/V/D> polymorphic  VT/Torsades.   Assessment/Plan    Polymorphic VT in setting of QT prolongation  - Recent admission for same (third time in the past year) - While on multiple Qtc agents last time, here appears to be more driven by massive electrolyte abnormalities - EP has seen previously, agree that no role for device given that her cause has been reversible. EP consulted.  - Prior normal cath - Off Lidocaine . No arrhythmias overnight. - EP starting nadolol  today.  - Dr Zenaida removed TVP.  - Recommend staying off monjauro. - Suspect alcohol use significantly contributing - Labs pending.  Check CMRI today.    Lactic acidosis:  - ?starvation acidosis versus due to initial shock - Resolved    3. Acute hypoxic respiratory failure due to #1 -Extubated . On 4 liters. Wean as tolerated.  - OOB  today. She has oxygen at home.  -IS every hour.   Can move out ICU. Consult PT. Transfer to [facility] to assume care.    Heart failure team will sign off as soon as she transfers to [facility]  03/31/24  HFollow up as an outpatient in the HF clinic ?  No  If no please follow up with [facility]  after discharge.   Length of Stay: 2  Greig Mosses, NP  03/31/2024, 7:58 AM  Advanced Heart Failure Team Pager [phone] (M-F; 7a - 5p)  Please contact [facility] for night-coverage after hours (5p -7a ) and weekends on [email or web address]

## 2024-03-31 NOTE — Progress Notes (Addendum)
 Patient Name: Lindsay French Date of Encounter: 03/31/2024  Primary Cardiologist: None Electrophysiologist: None  Interval Summary   The patient is doing well today.  Remains off lidocaine  infusion.  Pt asks about travel in the next few weeks. She reports significant anxiety.   At this time, the patient denies chest pain, shortness of breath, or any new concerns.  Vital Signs    Vitals:   03/31/24 0815 03/31/24 0830 03/31/24 0845 03/31/24 0900  BP: 106/66 104/64 99/61 (!) 101/57  Pulse: 80 70 66 68  Resp: 16 17 11 12   Temp:      TempSrc:      SpO2: 98% 99% 98% 98%  Weight:      Height:        Intake/Output Summary (Last 24 hours) at 03/31/2024 0911 Last data filed at 03/31/2024 0900 Gross per 24 hour  Intake 481.08 ml  Output 2230 ml  Net -1748.92 ml   Filed Weights   03/29/24 0500 03/30/24 0500 03/31/24 0500  Weight: 110.6 kg 116.3 kg 113.9 kg    Physical Exam    GEN- NAD, Alert and oriented  Lungs- Clear to ausculation bilaterally, normal work of breathing Cardiac- Regular rate and rhythm, no murmurs, rubs or gallops, TVP in place GI- soft, NT, ND, + BS Extremities- no clubbing or cyanosis. No edema  Telemetry    SR 70-90's, occ PVC, no VT (personally reviewed)  Hospital Course    Lindsay French is a 43 y.o. female with a history of morbid obesity on Ozempic  with intractable N/V, ETOH, HTN, HLD, DM II, GERD, ITP, HIV, PCOS, depression / anxiety, prolonged QT in setting of electrolyte disturbances & recent PMVT admitted for recurrent PMVT in setting of profound electrolyte disturbances.  Prior admissions for same in setting of multiple agents that prolong QT.    Assessment & Plan    Recurrent Polymorphic VT  QT Prolongation  Baseline QTc ~460-480's but as long as , negative recent LHC, admit K 2.4 / Mg 0.7 -no VT overnight  -monitor electrolytes  -discontinue TVP  -await cMRI to rule out scar mediated VT / infiltrative process  -will need genetic testing  as outpatient -avoid QT prolonging agents  -begin Nadolol  60 mg daily   Electrolyte Disturbances: Hypokalemia, Hypomagnesemia  Suspect ETOH abuse primary driver + N/V from [place]  -follow electrolytes closely, goal K+ >4, Mg+ >2  HIV on HAART -per primary     For questions or updates, please contact [facility] Please consult [email or web address] for contact info under     Signed, Daphne Barrack, NP-C, AGACNP-BC [facility] - Electrophysiology  03/31/2024, 9:11 AM   I have seen, examined the patient, and reviewed the above assessment and plan.    Interval: No acute overnight events. Patient reports feeling relatively well. No new or acute complaints.   No ventricular arrhythmias on tele since stopping lidocaine .  General: Well developed, in no acute distress.  Neck: No JVD.  Cardiac: Normal rate, regular rhythm.  Resp: Normal work of breathing.  Ext: No edema.  Neuro: No gross focal deficits.  Psych: Normal affect.   Assessment: Patient presented with polymorphic VT and QT prolongation in the setting of profound hypokalemia and hypomagnesemia.  This was likely secondary to poor p.o. intake and alcohol use.  She has had a previous hospitalization with PM VT in the setting of multiple QT prolonging medications.   Problem List:  #Torsades #Polymorphic VT #Hypokalemia  #Hypomagnesemia #HIV #Alcohol abuse history #Anxiety   Plan: -  Remove temp wire.  - Cardiac MRI.  - Start nadolol . - Avoid all QT prolonging medications.  - Closely monitor electrolytes and replete as necessary.  - Daily ECGs.   Fonda Kitty, MD 03/31/2024 8:41 PM

## 2024-03-31 NOTE — Telephone Encounter (Signed)
 [place] appointment 01/06/2024 @ 1:40 pm was canceled / do not see that patient has rescheduled this appointment.

## 2024-03-31 NOTE — Telephone Encounter (Signed)
 She is currently admitted in the hospital as well.  Thank you.

## 2024-03-31 NOTE — Progress Notes (Signed)
 NAME:  Lindsay French, MRN:  [id number], DOB:  09/01/1980, LOS: 2 ADMISSION DATE:  03/28/2024, CONSULTATION DATE: 03/30/2019 REFERRING MD:  Debarah Donley SAILOR, MD , CHIEF COMPLAINT: Chest pain and shortness of breath  History of Present Illness:  43 year old morbidly obese female with diabetes, hypertension, HIV on HARRT, cor pulmonale and recurrent VTE's in the setting of electrolyte abnormalities including hypokalemia and hypomagnesemia who presented overnight to ED with chest pain and shortness of breath on telemetry monitoring she was noted to have runs of polymorphic NSVT.  On workup she was noted to have magnesium  of 0.7, serum potassium of 2.4 and lactate of 7.9.  She was started on IV lidocaine  infusion and was given IV Ativan  with good decrease in mental status she was intubated and was admitted to ICU.  She is getting aggressive electrolyte replacement and currently on lidocaine  infusion at 1 mg PCCM was consulted for evaluation and medical management  Pertinent  Medical History   Past Medical History:  Diagnosis Date   ADHD    Anxiety    Asthma    usually with colds   Avascular necrosis (HCC)    Back pain    Depression    Diabetes mellitus without complication (HCC)    Elevated LFTs    Fatty liver 04/16/2016   Noted US  ABD   GERD (gastroesophageal reflux disease)    Heart murmur    at birth, no issues   History of heat stroke    with syncope   History of thrombocytopenia    prior to diagnosis HIV   HIV (human immunodeficiency virus infection) (HCC)    Hyperlipidemia    Hypertension    Hypothyroidism    Infertility, female    Obese    PCOS (polycystic ovarian syndrome)    Pneumonia 04/17/2016   SOB (shortness of breath)    Wears partial dentures    upper   Wrist fracture 11/2017   Left    Significant Hospital Events: Including procedures, antibiotic start and stop dates in addition to other pertinent events   12/7 admitted with polymorphic VT's in the setting of  hypomagnesemia/hypokalemia, temp pacing. Extubated 12/8 Lidocaine  gtt stopped; no further VT  Interim History / Subjective:  NAEO. Lidocaine  stopped 12/8 PM; no further episodes of NSVT; plan is to pull TVP and get cMRI today. Likely can transfer OOICU later today.  Objective    Blood pressure 117/74, pulse 72, temperature 98.7 F (37.1 C), temperature source Oral, resp. rate 17, height 5' 2.5 (1.588 m), weight 113.9 kg, SpO2 100%.        Intake/Output Summary (Last 24 hours) at 03/31/2024 9176 Last data filed at 03/31/2024 0600 Gross per 24 hour  Intake 500.12 ml  Output 2430 ml  Net -1929.88 ml   Filed Weights   03/29/24 0500 03/30/24 0500 03/31/24 0500  Weight: 110.6 kg 116.3 kg 113.9 kg   Examination: General: chronically-ill morbidly obese woman, in NAD HEENT: AT/[place], PERRL, 3mm bilaterally, wearing glasses Pulm:  CTAB; no wheeze, oxygenating well on 4L [place] CV: S1-S2, RRR, no m/g/r, TVP in place GI: soft, obese, non tender to palpation Neuro: A&O x3, no focal deficits   Labs reviewed  Patient Lines/Drains/Airways Status     Active Line/Drains/Airways     Name Placement date Placement time Site Days   Peripheral IV 03/28/24 20 G Posterior;Right Hand 03/28/24  2327  Hand  3   Peripheral IV 03/28/24 20 G Left Antecubital 03/28/24  2343  Antecubital  3  Sheath 03/29/24 Left Femoral 03/29/24  0116  Femoral  2   Urethral Catheter Sophia, RN 14 Fr. 03/29/24  0315  --  2   Wound 03/19/24 2240 Irritant Contact Dermatitis Flank Right;Upper 03/19/24  2240  Flank  12   Wound 03/19/24 2240 Irritant Contact Dermatitis Groin Bilateral 03/19/24  2240  Groin  12           Resolved problem list   Assessment and Plan  Recurrent polymorphic VT's/torsade Recurrent hypomagnesemia/hypokalemia Acute respiratory failure with hypoxia HIV on HAART Morbid obesity Diabetes type 2 Hypertension OSA  -Cardiology is following; s/p TVP for overdrive pacing -Per EP; no perm pacing at  this time; prior episodes have been reversible; prior normal cath-->plan to pull TVP and get cMRI today -Lidocaine  gtt stopped 12/8 -Continue aggressive electrolyte replacement and monitor, keep potassium >4 and magnesium  >2.5 -Wean supplemental O2 to maintain SpO2 >94% -Continue antiretroviral medications -Diet and exercise counseling as appropriate -Hgb A1c 4.8; Continue sliding scale insulin  with CBG goal 140-180 -Started on Lisinopril for uncontrolled hypertension -She will need sleep studies as an outpatient-referral sent -Started on Xanax  PRN overnight for anxiety; has a history of anxiety previously on Xanax ; however was dismissed by psychiatrist for missing appointments. She had a televisit w/ her PCP 12/8 who placed referral for psychiatrist.  PCCM will follow along   Labs   CBC: Recent Labs  Lab 03/28/24 2324 03/29/24 0242 03/29/24 0253 03/30/24 0359  WBC 8.4 13.3*  --  10.9*  HGB 12.1 13.6 12.9 10.8*  HCT 35.8* 40.9 38.0 33.5*  MCV 98.4 100.0  --  103.1*  PLT 311 279  --  207    Basic Metabolic Panel: Recent Labs  Lab 03/28/24 2324 03/29/24 0242 03/29/24 0253 03/29/24 0355 03/29/24 1357 03/30/24 0359  NA 138  --  135 139 135 135  K 2.4*  --  2.6* 2.9* 4.0 4.4  CL 103  --   --  95* 101 97*  CO2 17*  --   --  27 25 28   GLUCOSE 106*  --   --  129* 102* 105*  BUN <5*  --   --  <5* <5* <5*  CREATININE 0.55  --   --  0.64 0.51 0.64  CALCIUM  7.1*  --   --  8.5* 7.8* 8.2*  MG 0.7*  --   --  2.8* 2.4 1.9  PHOS  --  2.4*  --   --  3.5 2.7   GFR: Estimated Creatinine Clearance: 110.3 mL/min (by C-G formula based on SCr of 0.64 mg/dL). Recent Labs  Lab 03/28/24 2324 03/28/24 2354 03/29/24 0242 03/29/24 0451 03/29/24 0834 03/29/24 1510 03/30/24 0359  WBC 8.4  --  13.3*  --   --   --  10.9*  LATICACIDVEN  --  7.9*  --  2.6* 1.4 1.1  --     Liver Function Tests: Recent Labs  Lab 03/28/24 2324 03/29/24 0355 03/29/24 1357 03/30/24 0359  AST 42* 39  --    --   ALT 22 25  --   --   ALKPHOS 92 118  --   --   BILITOT 1.2 1.7*  --   --   PROT 5.7* 6.9  --   --   ALBUMIN 2.5* 3.1* 2.8* 2.6*   Recent Labs  Lab 03/28/24 2324  LIPASE 24   No results for input(s): AMMONIA in the last 168 hours.  ABG    Component Value Date/Time  PHART 7.363 03/29/2024 0253   PCO2ART 46.7 03/29/2024 0253   PO2ART 168 (H) 03/29/2024 0253   HCO3 26.6 03/29/2024 0253   TCO2 28 03/29/2024 0253   ACIDBASEDEF 3.0 (H) 03/20/2024 0954   O2SAT 99 03/29/2024 0253    Coagulation Profile: Recent Labs  Lab 03/29/24 0242  INR 1.7*   Cardiac Enzymes: No results for input(s): CKTOTAL, CKMB, CKMBINDEX, TROPONINI in the last 168 hours.  HbA1C: Hgb A1c MFr Bld  Date/Time Value Ref Range Status  03/19/2024 06:39 PM 4.8 4.8 - 5.6 % Final    Comment:    (NOTE)         Prediabetes: 5.7 - 6.4         Diabetes: >6.4         Glycemic control for adults with diabetes: <7.0   12/03/2023 03:31 PM 4.9 4.8 - 5.6 % Final    Comment:             Prediabetes: 5.7 - 6.4          Diabetes: >6.4          Glycemic control for adults with diabetes: <7.0    CBG: Recent Labs  Lab 03/30/24 1608 03/30/24 2007 03/31/24 0020 03/31/24 0403 03/31/24 0629  GLUCAP 123* 110* 124* 92 87   Review of Systems:   Review of systems completed with pertinent positives/negatives outlined in above HPI.  Past Medical History:  She,  has a past medical history of ADHD, Anxiety, Asthma, Avascular necrosis (HCC), Back pain, Depression, Diabetes mellitus without complication (HCC), Elevated LFTs, Fatty liver (04/16/2016), GERD (gastroesophageal reflux disease), Heart murmur, History of heat stroke, History of thrombocytopenia, HIV (human immunodeficiency virus infection) (HCC), Hyperlipidemia, Hypertension, Hypothyroidism, Infertility, female, Obese, PCOS (polycystic ovarian syndrome), Pneumonia (04/17/2016), SOB (shortness of breath), Wears partial dentures, and Wrist fracture  (11/2017).   Surgical History:   Past Surgical History:  Procedure Laterality Date   BIOPSY  06/22/2023   Procedure: BIOPSY;  Surgeon: Shila Gustav GAILS, MD;  Location: [facility] ENDOSCOPY;  Service: Gastroenterology;;   ESOPHAGOGASTRODUODENOSCOPY (EGD) WITH PROPOFOL  N/A 06/22/2023   Procedure: ESOPHAGOGASTRODUODENOSCOPY (EGD) WITH PROPOFOL ;  Surgeon: Shila Gustav GAILS, MD;  Location: [facility] ENDOSCOPY;  Service: Gastroenterology;  Laterality: N/A;   HYSTEROSCOPY N/A 12/31/2017   Procedure: HYSTEROSCOPY and polypectomy;  Surgeon: Yalcinkaya, Tamer, MD;  Location: [facility];  Service: Gynecology;  Laterality: N/A;   I & D EXTREMITY Left 07/15/2020   Procedure: PARTIAL EXCISION LEFT TIBIA;  Surgeon: Harden Jerona GAILS, MD;  Location: [facility] OR;  Service: Orthopedics;  Laterality: Left;   IR FLUORO GUIDE CV LINE LEFT  09/26/2020   IR RADIOLOGIST EVAL & MGMT  08/12/2020   maxillofacial surgery     OPEN REDUCTION INTERNAL FIXATION (ORIF) FOOT LISFRANC FRACTURE Left 06/11/2023   Procedure: OPEN TREAMENT OF LEFT FIRST, SECOND, FOURTH METATARSALS, MEDIAL CUNEIFORM FRACTURE, CUBOID, FIRST SECOND THRID TARSOMETATARSAL AND LISFRANC JOINT;  Surgeon: Elsa Lonni SAUNDERS, MD;  Location: [facility] OR;  Service: Orthopedics;  Laterality: Left;   RIGHT/LEFT HEART CATH AND CORONARY ANGIOGRAPHY N/A 03/19/2024   Procedure: RIGHT/LEFT HEART CATH AND CORONARY ANGIOGRAPHY;  Surgeon: Anner Alm ORN, MD;  Location: [facility] INVASIVE CV LAB;  Service: Cardiovascular;  Laterality: N/A;   TEMPORARY PACEMAKER N/A 03/19/2024   Procedure: TEMPORARY PACEMAKER;  Surgeon: Anner Alm ORN, MD;  Location: [facility] INVASIVE CV LAB;  Service: Cardiovascular;  Laterality: N/A;   TEMPORARY PACEMAKER N/A 03/29/2024   Procedure: TEMPORARY PACEMAKER;  Surgeon: Jordan, Peter M, MD;  Location: [facility] INVASIVE CV LAB;  Service: Cardiovascular;  Laterality: N/A;     Social History:   reports that she has quit smoking. Her smoking use included cigarettes. She has  a 4.5 pack-year smoking history. She has never used smokeless tobacco. She reports current alcohol use of about 7.0 standard drinks of alcohol per week. She reports that she does not currently use drugs after having used the following drugs: Crack cocaine.   Family History:  Her family history includes Depression in her father and mother; Hyperlipidemia in her father; Hypertension in her father; Hypothyroidism in her mother; Obesity in her father and mother; Thyroid disease in her mother.   Allergies Allergies  Allergen Reactions   Citalopram Hydrobromide Other (See Comments)    Felt weird 04/2021 Admission for pVT/torsades 03/2024    Fluconazole  Other (See Comments)    Required hospitalization for VT/torsades > would not restart     Lithium  Other (See Comments)    Required hospitalization for VT/torsades 03/2024 > would not restart     Cephalexin  Other (See Comments)    Burning face   Mucinex  Fast-Max Chest Cong Ms [Guaifenesin ] Nausea And Vomiting   Ancef  [Cefazolin ] Rash   Hydrocodone  Itching   Latex Other (See Comments)    As a teenager, when using latex condoms, she would get a yeast infection.   Lisinopril Cough     Home Medications  Prior to Admission medications   Medication Sig Start Date End Date Taking? Authorizing Provider  albuterol  (VENTOLIN  HFA) 108 (90 Base) MCG/ACT inhaler INHALE 1 PUFF INTO THE LUNGS EVERY 6 HOURS AS NEEDED FOR WHEEZING OR SHORTNESS OF BREATH 06/05/22   Eben Reyes BROCKS, MD  ALPRAZolam  (XANAX ) 1 MG tablet Take 1 tablet (1 mg total) by mouth 3 (three) times daily as needed for anxiety. 08/28/23   Eben Reyes BROCKS, MD  amLODipine  (NORVASC ) 5 MG tablet Take 1 tablet (5 mg total) by mouth daily. Follow with your PCP for refills 03/24/24 04/23/24  Perri DELENA Meliton Mickey., MD  aspirin  81 MG chewable tablet Chew 1 tablet (81 mg total) by mouth daily. 03/24/24 06/22/24  Perri DELENA Meliton Mickey., MD  atorvastatin  (LIPITOR ) 80 MG tablet Take 1 tablet (80 mg  total) by mouth daily. 03/24/24 06/22/24  Perri DELENA Meliton Mickey., MD  budesonide -formoterol  (SYMBICORT ) 80-4.5 MCG/ACT inhaler Inhale 2 puffs into the lungs daily. Patient taking differently: Inhale 2 puffs into the lungs daily as needed (when sick). 06/12/22   Eben Reyes BROCKS, MD  dolutegravir -lamiVUDine  ([name] ) 50-300 MG tablet Take 1 tablet by mouth every evening. 08/20/23   Eben Reyes BROCKS, MD  folic acid  (FOLVITE ) 1 MG tablet Take 1 tablet (1 mg total) by mouth daily. Patient not taking: Reported on 03/20/2024 06/23/23   Masters, Izetta, DO  Glucose Blood (BLOOD GLUCOSE TEST STRIPS) STRP 1 each by Does not apply route as directed. Dispense based on patient and insurance preference. Use up to four times daily as directed. (FOR ICD-10 E10.9, E11.9). 02/26/24   Eben Reyes BROCKS, MD  IMODIUM A-D 2 MG capsule Take 2 mg by mouth as needed for diarrhea or loose stools.    [provider]  Iron , Ferrous Sulfate , 325 (65 Fe) MG TABS Take 1 tablet by mouth daily. Patient taking differently: Take 325 mg by mouth daily after supper. 07/30/23 03/20/24  Eben Reyes BROCKS, MD  Lancets MISC 1 each by Does not apply route as directed. Dispense based on patient and insurance preference. Use up to four times  daily as directed. (FOR ICD-10 E10.9, E11.9). 02/26/24   Eben Reyes BROCKS, MD  Magnesium  200 MG TABS Take 2 tablets (400 mg total) by mouth daily. 01/11/24   Regalado, Belkys A, MD  MOUNJARO  2.5 MG/0.5ML Pen Inject 2.5 mg into the skin once a week. 03/14/24   [provider]  naloxone (NARCAN) nasal spray 4 mg/0.1 mL Place 1 spray into the nose once as needed (AS DIRECTED FOR A CRISIS). 09/03/23   [provider]  NEOMYCIN -POLYMYXIN-HYDROCORTISONE (CORTISPORIN ) 1 % SOLN OTIC solution Place 3 drops into the left ear every 6 (six) hours for 7 days. Patient not taking: Reported on 03/29/2024 03/23/24 03/30/24  Perri DELENA Meliton Mickey., MD  pantoprazole  (PROTONIX ) 40 MG tablet Take 1 tablet  (40 mg total) by mouth daily. Patient taking differently: Take 40 mg by mouth at bedtime. 08/19/23   Eben Reyes BROCKS, MD  sertraline  (ZOLOFT ) 50 MG tablet Take 1 tablet (50 mg total) by mouth at bedtime. Follow with your psychiatrist for refills.  You should probably get an EKG to evaluate for QT prolongation. 03/23/24 04/22/24  Perri DELENA Meliton Mickey., MD    Warren Shade, DNP, AGACNP-BC [facility]  Please see [email or web address] for pager details.  From 7A-7P if no response, please call [phone]. After hours, please call ELink [phone].

## 2024-03-31 NOTE — Telephone Encounter (Signed)
 Can we schedule?

## 2024-03-31 NOTE — Telephone Encounter (Signed)
 Order placed

## 2024-04-01 ENCOUNTER — Other Ambulatory Visit (HOSPITAL_COMMUNITY): Payer: Self-pay

## 2024-04-01 ENCOUNTER — Encounter: Admitting: Infectious Diseases

## 2024-04-01 DIAGNOSIS — E878 Other disorders of electrolyte and fluid balance, not elsewhere classified: Secondary | ICD-10-CM | POA: Diagnosis not present

## 2024-04-01 DIAGNOSIS — Z79899 Other long term (current) drug therapy: Secondary | ICD-10-CM | POA: Diagnosis not present

## 2024-04-01 DIAGNOSIS — Z8709 Personal history of other diseases of the respiratory system: Secondary | ICD-10-CM

## 2024-04-01 DIAGNOSIS — E785 Hyperlipidemia, unspecified: Secondary | ICD-10-CM

## 2024-04-01 DIAGNOSIS — E119 Type 2 diabetes mellitus without complications: Secondary | ICD-10-CM | POA: Diagnosis not present

## 2024-04-01 DIAGNOSIS — F321 Major depressive disorder, single episode, moderate: Secondary | ICD-10-CM

## 2024-04-01 DIAGNOSIS — F1090 Alcohol use, unspecified, uncomplicated: Secondary | ICD-10-CM

## 2024-04-01 DIAGNOSIS — E669 Obesity, unspecified: Secondary | ICD-10-CM | POA: Diagnosis not present

## 2024-04-01 DIAGNOSIS — D509 Iron deficiency anemia, unspecified: Secondary | ICD-10-CM

## 2024-04-01 DIAGNOSIS — E440 Moderate protein-calorie malnutrition: Secondary | ICD-10-CM

## 2024-04-01 DIAGNOSIS — Z6841 Body Mass Index (BMI) 40.0 and over, adult: Secondary | ICD-10-CM | POA: Diagnosis not present

## 2024-04-01 DIAGNOSIS — B20 Human immunodeficiency virus [HIV] disease: Secondary | ICD-10-CM | POA: Diagnosis not present

## 2024-04-01 DIAGNOSIS — F909 Attention-deficit hyperactivity disorder, unspecified type: Secondary | ICD-10-CM

## 2024-04-01 DIAGNOSIS — F419 Anxiety disorder, unspecified: Secondary | ICD-10-CM

## 2024-04-01 DIAGNOSIS — I4721 Torsades de pointes: Secondary | ICD-10-CM | POA: Diagnosis not present

## 2024-04-01 DIAGNOSIS — Z7985 Long-term (current) use of injectable non-insulin antidiabetic drugs: Secondary | ICD-10-CM | POA: Diagnosis not present

## 2024-04-01 LAB — CBC
HCT: 34.2 % — ABNORMAL LOW (ref 36.0–46.0)
Hemoglobin: 11 g/dL — ABNORMAL LOW (ref 12.0–15.0)
MCH: 33.3 pg (ref 26.0–34.0)
MCHC: 32.2 g/dL (ref 30.0–36.0)
MCV: 103.6 fL — ABNORMAL HIGH (ref 80.0–100.0)
Platelets: 207 K/uL (ref 150–400)
RBC: 3.3 MIL/uL — ABNORMAL LOW (ref 3.87–5.11)
RDW: 13.2 % (ref 11.5–15.5)
WBC: 8.6 K/uL (ref 4.0–10.5)
nRBC: 0 % (ref 0.0–0.2)

## 2024-04-01 LAB — RENAL FUNCTION PANEL
Albumin: 2.6 g/dL — ABNORMAL LOW (ref 3.5–5.0)
Anion gap: 8 (ref 5–15)
BUN: <5 mg/dL — ABNORMAL LOW (ref 6–20)
CO2: 30 mmol/L (ref 22–32)
Calcium: 8.3 mg/dL — ABNORMAL LOW (ref 8.9–10.3)
Chloride: 95 mmol/L — ABNORMAL LOW (ref 98–111)
Creatinine, Ser: 0.62 mg/dL (ref 0.44–1.00)
GFR, Estimated: >60 mL/min (ref >60)
Glucose, Bld: 96 mg/dL (ref 70–99)
Phosphorus: 3 mg/dL (ref 2.5–4.6)
Potassium: 3.9 mmol/L (ref 3.5–5.1)
Sodium: 133 mmol/L — ABNORMAL LOW (ref 135–145)

## 2024-04-01 LAB — MAGNESIUM: Magnesium: 1.6 mg/dL — ABNORMAL LOW (ref 1.7–2.4)

## 2024-04-01 LAB — GLUCOSE, CAPILLARY
Glucose-Capillary: 71 mg/dL (ref 70–99)
Glucose-Capillary: 107 mg/dL — ABNORMAL HIGH (ref 70–99)
Glucose-Capillary: 99 mg/dL (ref 70–99)

## 2024-04-01 MED ORDER — NADOLOL 20 MG PO TABS
60.0000 mg | ORAL_TABLET | Freq: Every day | ORAL | 0 refills | Status: DC
  Filled 2024-04-01: qty 180, 60d supply, fill #0

## 2024-04-01 MED ORDER — ALPRAZOLAM 1 MG PO TABS
1.0000 mg | ORAL_TABLET | Freq: Three times a day (TID) | ORAL | 0 refills | Status: DC | PRN
  Filled 2024-04-01: qty 21, 7d supply, fill #0

## 2024-04-01 MED ORDER — ALPRAZOLAM 1 MG PO TABS
1.0000 mg | ORAL_TABLET | Freq: Three times a day (TID) | ORAL | 0 refills | Status: AC | PRN
  Filled 2024-04-01: qty 21, 7d supply, fill #0

## 2024-04-01 MED ORDER — MAGNESIUM OXIDE -MG SUPPLEMENT 400 (240 MG) MG PO TABS
400.0000 mg | ORAL_TABLET | Freq: Every day | ORAL | 0 refills | Status: DC
  Filled 2024-04-01: qty 120, 120d supply, fill #0

## 2024-04-01 MED ORDER — MAGNESIUM SULFATE 4 GM/100ML IV SOLN
4.0000 g | Freq: Once | INTRAVENOUS | Status: AC
  Administered 2024-04-01: 4 g via INTRAVENOUS
  Filled 2024-04-01: qty 100

## 2024-04-01 MED ORDER — NADOLOL 20 MG PO TABS
60.0000 mg | ORAL_TABLET | Freq: Every day | ORAL | 0 refills | Status: AC
  Filled 2024-04-01: qty 90, 30d supply, fill #0

## 2024-04-01 MED ORDER — MAGNESIUM OXIDE -MG SUPPLEMENT 400 (240 MG) MG PO TABS
400.0000 mg | ORAL_TABLET | Freq: Every day | ORAL | 0 refills | Status: AC
  Filled 2024-04-01: qty 120, 120d supply, fill #0

## 2024-04-01 NOTE — Discharge Instructions (Addendum)
 Dear Lindsay French,  Thank you for letting us  participate in your care. You were in the hospital because your heart went into an abnormal rhythm called torsades. This happened because your magnesium  and potassium levels dropped very low, which made your heart more sensitive to certain medications. You were treated with IV medicines, had your electrolytes replaced, and your heart rhythm is now stable.  Other problems managed this admission included anxiety, anemia, HIV, and high cholesterol levels.   MEDICATION CHANGES - Magnesium . Take your prescribed magnesium  supplement every day. Do not skip doses.  POST-HOSPITAL & CARE INSTRUCTIONS Take all medications exactly as prescribed. Avoid any new medications (including over-the-counter nausea medicines) unless your doctor approves them, because some medicines can cause your heart rhythm to become unsafe again. Try to eat more magnesium -rich foods and try to drink milk daily, as it will help keep your magnesium  levels steadier. Please call to arrange follow up with your primary care physician in 1 week.  Go to your follow up appointments (listed below)   DOCTOR'S APPOINTMENT   Future Appointments  Date Time Provider Department Center  04/06/2024  9:15 AM Napoleon Limes, MD [facility] [address]  04/29/2024  1:45 PM Theodoro Lakes, MD [facility] [address]  07/07/2024  2:00 PM Orman Erminio POUR, PA-C [facility] None    Take care and be well!  Internal Medicine Teaching Service Inpatient Team [place]  [facility]  [address] [place], [place] [address] [phone]

## 2024-04-01 NOTE — Progress Notes (Addendum)
 Patient Name: Lindsay French Date of Encounter: 04/01/2024  Primary Cardiologist: None Electrophysiologist: None  Interval Summary   The patient is doing well today.  Results of MRI pending. At this time, the patient denies chest pain, shortness of breath, or any new concerns.  Vital Signs    Vitals:   03/31/24 2100 03/31/24 2300 04/01/24 0300 04/01/24 0735  BP: 103/61 103/61 (!) 107/56 111/60  Pulse: 63 61 61 60  Resp: 18 18 15  (!) 21  Temp: 97.6 F (36.4 C) 97.6 F (36.4 C) 98.6 F (37 C) 99.4 F (37.4 C)  TempSrc: Oral Oral Oral Oral  SpO2: 95% 100% 100%   Weight:   110.2 kg   Height:   5' 2 (1.575 m)     Intake/Output Summary (Last 24 hours) at 04/01/2024 0958 Last data filed at 04/01/2024 0900 Gross per 24 hour  Intake 492.47 ml  Output 250 ml  Net 242.47 ml   Filed Weights   03/30/24 0500 03/31/24 0500 04/01/24 0300  Weight: 116.3 kg 113.9 kg 110.2 kg    Physical Exam    GEN- NAD, Alert and oriented  Lungs- Clear to ausculation bilaterally, normal work of breathing Cardiac- Regular rate and rhythm, no murmurs, rubs or gallops GI- soft, NT, ND, + BS Extremities- no clubbing or cyanosis. No edema  Telemetry    SR 60-80's, no VT overnight (personally reviewed)  Hospital Course    Lindsay French is a 43 y.o. female with a history of morbid obesity on Ozempic  with intractable N/V, ETOH, HTN, HLD, DM II, GERD, ITP, HIV, PCOS, depression / anxiety, prolonged QT in setting of electrolyte disturbances & recent PMVT admitted for recurrent PMVT in setting of profound electrolyte disturbances.  Two prior admissions for same in setting of multiple agents that prolong QT and electrolyte disturbances.    Assessment & Plan    Recurrent Polymorphic VT  QT Prolongation  Baseline QTc ~460-480's but as long as , negative recent LHC, admit K 2.4 / Mg 0.7.  QT prolongation appears to be transient with medications, electrolyte disturbances at this time but reasonable to  review genetics.  -await cMRI review to rule out scar mediated VT / infiltrative process  -plan for genetic testing as outpatient  -avoid QT prolonging agents  -pt needs to ensure she eats during the day / balanced diet to avoid electrolyte disturbances.  -not likely a candidate to go back on GLP's due to poor intake and ETOH  -continue nadolol  60 mg daily at discharge  -will consult Nutrition to educate patient on diet    Electrolyte Disturbances: Hypokalemia, Hypomagnesemia  Suspect ETOH abuse primary driver + N/V from [place]  -monitor electrolytes, goal K+ >4, Mg+ >2   HIV on HAART  -per ID     For questions or updates, please contact [facility] Please consult [email or web address] for contact info under     Signed, Daphne Barrack, NP-C, AGACNP-BC [facility] - Electrophysiology  04/01/2024, 9:58 AM  I have seen, examined the patient, and reviewed the above assessment and plan.    Interval:  No acute overnight events. Patient reports feeling relatively well. No new or acute complaints.   General: Well developed, in no acute distress.  Neck: No JVD.  Cardiac: Normal rate, regular rhythm.  Resp: Normal work of breathing.  Ext: No edema.  Neuro: No gross focal deficits.  Psych: Normal affect.   EKG: Qtc by telemetry  Assessment:  Patient presented with polymorphic VT and QT prolongation  in the setting of profound hypokalemia and hypomagnesemia.  This was likely secondary to poor p.o. intake and alcohol use.  She has had a previous hospitalization with PMVT in the setting of multiple QT prolonging medications.  She had a normal cardiac MRI.  She would be a high risk candidate for defibrillator given her high infectious risk and history of noncompliance.  Given these factors and that her arrhythmia appears to be from a reversible etiology, we favor not proceeding with defibrillator at this time.  Patient prefers this.   Problem List:   #Torsades #Polymorphic VT #Hypokalemia  #Hypomagnesemia #HIV #Alcohol abuse history #Anxiety   Plan: -Continue nadolol  nadolol . - Avoid all QT prolonging medications.  - Closely monitor electrolytes and replete as necessary.  - Stop GLP-1.  This appears to be a significant contributor to her nausea, vomiting, decreased oral intake which has resulted in profound electrolyte derangements and the use of antiemetics that are QT prolonging.     Fonda Kitty, MD, [facility], [facility] Cardiac Electrophysiology

## 2024-04-01 NOTE — TOC Transition Note (Signed)
 Transition of Care [facility]) - Discharge Note   Patient Details  Name: Lindsay French MRN: [id number] Date of Birth: Nov 05, 1980  [facility] CM/SW Contact:  Waddell Barnie Rama, RN Phone Number: 04/01/2024, 5:51 PM   Clinical Narrative:    NCM spoke with patient, she states she does not want HHPT.  She has transport home.      Barriers to Discharge: Continued Medical Work up   Patient Goals and CMS Choice Patient states their goals for this hospitalization and ongoing recovery are:: wants to recover          Discharge Placement                       Discharge Plan and Services Additional resources added to the After Visit Summary for     Discharge Planning Services: CM Consult                                 Social Drivers of Health (SDOH) Interventions SDOH Screenings   Food Insecurity: Food Insecurity Present (03/31/2024)  Housing: Low Risk  (03/31/2024)  Transportation Needs: Unmet Transportation Needs (03/31/2024)  Utilities: Not At Risk (03/31/2024)  Alcohol Screen: Low Risk  (07/31/2022)  Depression (PHQ2-9): High Risk (01/22/2024)  Financial Resource Strain: Low Risk  (07/31/2022)  Physical Activity: Sufficiently Active (07/31/2022)  Social Connections: Moderately Integrated (07/31/2022)  Recent Concern: Social Connections - Moderately Isolated (06/12/2022)  Stress: No Stress Concern Present (07/31/2022)  Tobacco Use: Medium Risk (03/28/2024)     Readmission Risk Interventions     No data to display

## 2024-04-01 NOTE — Plan of Care (Signed)
°  Problem: Clinical Measurements: Goal: Cardiovascular complication will be avoided Outcome: Progressing   Problem: Coping: Goal: Level of anxiety will decrease Outcome: Progressing   Problem: Coping: Goal: Ability to adjust to condition or change in health will improve Outcome: Progressing

## 2024-04-01 NOTE — Progress Notes (Addendum)
 Internal Medicine/Infectious Disease    Date of Admission:  03/28/2024      ID: Taylor Spilde is a 43 y.o. female with   Principal Problem:   Torsades de pointes (HCC) Active Problems:   Anxiety state   Electrolyte disturbance   Recurrent major depressive disorder   Cardiac arrhythmia   Gastroparesis   HIV infection (HCC)   Hypophosphatemia   Morbid (severe) obesity due to excess calories (HCC)    Subjective: Pt seen with psychiatry.  She becomes tearful about her cardiac episodes.  States her main goal is for her to [place] her [place] in [place] next week for the [place].   Medications:   aspirin  EC  81 mg Oral Daily   atorvastatin   80 mg Oral Daily   Chlorhexidine  Gluconate Cloth  6 each Topical Daily   dolutegravir -lamiVUDine   1 tablet Oral QPM   enoxaparin  (LOVENOX ) injection  50 mg Subcutaneous Q24H   folic acid   1 mg Oral Daily   mupirocin  ointment  1 Application Nasal BID   nadolol   60 mg Oral Daily   pantoprazole  (PROTONIX ) IV  40 mg Intravenous QHS   polyethylene glycol  17 g Oral BID   senna-docusate  2 tablet Oral BID   sorbitol   60 mL Oral Once    Objective: Vital signs in last 24 hours: Temp:  [97.6 F (36.4 C)-99.4 F (37.4 C)] 99.4 F (37.4 C) (12/10 0735) Pulse Rate:  [56-82] 60 (12/10 0735) Resp:  [9-21] 21 (12/10 0735) BP: (81-126)/(45-97) 111/60 (12/10 0735) SpO2:  [89 %-100 %] 100 % (12/10 0300) Weight:  [110.2 kg] 110.2 kg (12/10 0300)   General appearance: alert, cooperative, no distress, and morbidly obese Resp: clear to auscultation bilaterally Cardio: regular rate and rhythm GI: normal findings: bowel sounds normal and soft, non-tender Extremities: edema non-pitting  Lab Results Recent Labs    03/31/24 1155 04/01/24 0629  WBC 12.2* 8.6  HGB 10.8* 11.0*  HCT 33.3* 34.2*  NA 133* 133*  K 5.0 3.9  CL 100 95*  CO2 25 30  BUN <5* <5*  CREATININE 0.45 0.62   Liver Panel Recent Labs    03/31/24 1155 04/01/24 0629  ALBUMIN 2.6*  2.6*   Sedimentation Rate No results for input(s): ESRSEDRATE in the last 72 hours. C-Reactive Protein No results for input(s): CRP in the last 72 hours.  Microbiology:  Studies/Results: DG CHEST PORT 1 VIEW Result Date: 03/31/2024 CLINICAL DATA:  Hypoxia. EXAM: PORTABLE CHEST 1 VIEW COMPARISON:  03/19/2024 FINDINGS: Borderline enlarged cardiac silhouette. Clear lungs with normal vascularity. Normal-appearing bones. IMPRESSION: No active disease. Electronically Signed   By: Elspeth Bathe M.D.   On: 03/31/2024 15:58     Assessment/Plan: Polymorphic VT Appreciate CV f/u Pacer out Cardiac MRI read pending. Consider CV f/u outpt  HypoK, Hypo Mg Repleted this AM  ETOH use Encourage abstinence  Anxiety Discussed with psychiatry- pt to continue zoloft  at current dose (at 1 week will not have full effects yet) Continue her xanax . She has been on for 10 years and would not want to precipitate withdrawal.  With psychiatry assistance, I am glad to write this for her.  She will f/u with her prev psychiatrist to see if they will take her back Otherwise, ask TOC to set up appt with [facility]  Pt agrees to this. Appreciate psychiatry eval and f/u  HIV Continue dovato   Obesity Off monjaro for now  DM2 I asked to f/u with [facility] for clinic appt  Protein Calorie Malnutrition (moderate) Nutrition to f/u  My great appreciation to Dr Nooruddin and BERNIS Reyes Fenton   04/01/2024, 9:01 AM

## 2024-04-01 NOTE — Progress Notes (Signed)
 Subjective:   Summary: Lindsay French is a 43 y.o. year old female currently admitted on the [facility] HD#4 for torsades.  Overnight Events: Transferred to [facility] Course Summary:   Lindsay French was admitted on 12/7 after presenting to the emergency department with nausea, vomiting, diarrhea, chest pain and shortness of breath. Her telemetry monitoring showed polymorphic NSVT, and initially started on a lidocaine  infusion. She required intubation as well as transvenous pacing. Her electrolytes were aggressively replete (magnesium  and potassium). Extubated on 12/7, Lidocaine  infusion was stopped on 12/8, and Temp wire was removed on 12/9. This would be her second admission in the last month for torsades de point.   Today, she is feeling well, with no acute concerns. States that Xanax  really helps her, and that she has a trip to [place]  coming up in 6 days and needs something for anxiety.    Objective:  Vital signs in last 24 hours: Vitals:   03/31/24 1900 03/31/24 2100 03/31/24 2300 04/01/24 0300  BP: 104/62 103/61 103/61 (!) 107/56  Pulse: 61 63 61 61  Resp: 20 18 18 15   Temp: 98 F (36.7 C) 97.6 F (36.4 C) 97.6 F (36.4 C) 98.6 F (37 C)  TempSrc: Oral Oral Oral Oral  SpO2: 98% 95% 100% 100%  Weight:    110.2 kg  Height:    5' 2 (1.575 m)   Supplemental O2: Room Air    Physical Exam:  Constitutional: well-appearing female sitting in bed, in no acute distress Cardiovascular: RRR, no murmurs, rubs or gallops Pulmonary/Chest: normal work of breathing on room air, lungs clear to auscultation bilaterally Abdominal: soft, non-tender, non-distended Skin: warm and dry Extremities: upper/lower extremity pulses 2+, no lower extremity edema present  Filed Weights   03/30/24 0500 03/31/24 0500 04/01/24 0300  Weight: 116.3 kg 113.9 kg 110.2 kg     Intake/Output Summary (Last 24 hours) at 04/01/2024 0728 Last data filed at 03/31/2024 2100 Gross per 24  hour  Intake 272.46 ml  Output --  Net 272.46 ml   Net IO Since Admission: -1,573.04 mL [04/01/24 0728]  Pertinent Labs:    Latest Ref Rng & Units 04/01/2024    6:29 AM 03/31/2024   11:55 AM 03/30/2024    3:59 AM  CBC  WBC 4.0 - 10.5 K/uL 8.6  12.2  10.9   Hemoglobin 12.0 - 15.0 g/dL 88.9  89.1  89.1   Hematocrit 36.0 - 46.0 % 34.2  33.3  33.5   Platelets 150 - 400 K/uL 207  221  207        Latest Ref Rng & Units 04/01/2024    6:29 AM 03/31/2024   11:55 AM 03/30/2024    3:59 AM  CMP  Glucose 70 - 99 mg/dL 96  94  894   BUN 6 - 20 mg/dL <5  <5  <5   Creatinine 0.44 - 1.00 mg/dL 9.37  9.54  9.35   Sodium 135 - 145 mmol/L 133  133  135   Potassium 3.5 - 5.1 mmol/L 3.9  5.0  4.4   Chloride 98 - 111 mmol/L 95  100  97   CO2 22 - 32 mmol/L 30  25  28    Calcium  8.9 - 10.3 mg/dL 8.3  8.4  8.2      Imaging: DG CHEST PORT 1 VIEW Result Date: 03/31/2024 CLINICAL DATA:  Hypoxia. EXAM: PORTABLE  CHEST 1 VIEW COMPARISON:  03/19/2024 FINDINGS: Borderline enlarged cardiac silhouette. Clear lungs with normal vascularity. Normal-appearing bones. IMPRESSION: No active disease. Electronically Signed   By: Elspeth Bathe M.D.   On: 03/31/2024 15:58    Assessment/Plan:   Principal Problem:   Torsades de pointes (HCC) Active Problems:   Anxiety state   Electrolyte disturbance   Recurrent major depressive disorder   Cardiac arrhythmia   Gastroparesis   HIV infection (HCC)   Hypophosphatemia   Morbid (severe) obesity due to excess calories [facility])   Patient Summary: Lindsay French is a 43 y.o. with a pertinent PMH of HIV, obesity on GLP-1, who presented with syncope and admitted for torsades de point.    #Torsades de Point Secondary to Electrolyte Imbalance Pt was transitioned off lidocaine  infusion, and started on Nadolol  60mg . Her rates have been well controlled, rhythm as well. EP following, recommending obtaining cardiac MRI, which results are currently pending. Replacing Mg and K as  needed.  Etiology wise secondary to electrolyte imbalance with recurrent intractable nausea and vomiting. Could be in the setting of GLP-1 use, with alcohol as well. She was previously on Lithium , SSRI, and other QT prolonging medications as well, will hold those.   Plan:  - Follow up Cardiac MRI  - Follow up EP recommendations  - Keep K>4, Mg>2 - Hold QT prolonging medications    #Anxiety Has long term history of anxiety, treated intermittently with Xanax . Has been getting 1mg  xanax  TID PRN. Will need outpatient follow up for further evaluation with psychiatry.    #Macrocytic Anemia Most recent Hb at 11, appears to be around her baseline. MCV at 103.6, suspecting from her alcohol use. On folic acid  supplementation  #Obesity  Hold GLP-1 upon discharge.   #HIV  On [name] , will continue. Follows with Dr. Eben outpatient.   #HLD Continuing her atorvastatin . Unclear as to why she's on aspirin  with L heart cath being without coronary blockage, no hx of CVA. Was on it after surgery back in March for left midfoot fracture dislocation for DVT prophylaxis.    #Acute Respiratory Failure Resolved, on room air currently  Diet: Normal IVF: None,None VTE: Enoxaparin  Code: Full   Dispo: Anticipated discharge to Home in 1 days pending medical management.   Zakery Normington, MD PGY-3 Internal Medicine Resident Pager Number [phone] Please contact the on call pager after 5 pm and on weekends at [phone].

## 2024-04-01 NOTE — Consult Note (Signed)
 [facility] Psychiatric Consult Initial  Patient Name: Lindsay French  MRN: [id number]  DOB: [birth year]  Consult Order details:  Orders (From admission, onward)     Start     Ordered   03/30/24 1443  IP CONSULT TO PSYCHIATRY       Ordering Provider: Eben Reyes BROCKS, MD  Provider:  (Not yet assigned)  Question Answer Comment  Location [facility]   Reason for Consult? anxiety, depression      03/30/24 1442            Mode of Visit: In person   Psychiatry Consult Evaluation  Service Date: April 01, 2024 LOS:  LOS: 3 days  Chief Complaint anxiety  Primary Psychiatric Diagnoses  Generalized anxiety disorder Major depressive disorder, moderate 2.  ADHD by history  Assessment  Lindsay French is a 43 y.o. female admitted medically on 03/28/2024 11:02 PM for chest pain and shortness of breath and was found to have runs of NSVT mostly PM, bigeminy and early signs of potential TdP conversion. This is her third admission for polymorphic Vtach. She carries the psychiatric diagnoses of GAD, MDD, ADHD and has a past medical history of  morbid obesity, nocturnal hypoxia on HOT (?OSA), DM-2, HTN, dyslipidemia, hypomagnesemia, HIV on HAART, GERD, depression/anxiety, and prolonged QT c/v TdP in in late 02/2024 due to hypokalemia and hypomagnesemia.  Patient's current presentation of rumination, excessive worry, restlessness is most consistent with generalized anxiety disorder. Patient also endorses a history of recurrent panic attacks in which she experiences shortness of breath, nausea, and tachycardia, which have prevented her from leaving the house in the past for an extended period of time, most consistent with panic disorder. Patient has a history of major depressive disorder, and reports ongoing insomnia and low mood. She does not meet criteria for inpatient hospitalization as there is no acute safety risk. Current outpatient psychotropic medications include Xanax  and  historically she has had a good response to this medication. Patient reports previously taking Prozac  and Lithium , however these were discontinued out of concern for the risk of QT prolongation given patient's history of recurrent Torsades. She is currently prescribed Zoloft , and reports limited efficacy, though patient was reportedly not taking regularly.  On initial examination, patient is very anxious. She quickly becomes tearful and increasingly distressed when describing an upcoming plane trip to [place] . Patient repeatedly asserts that she needs Xanax  refilled before her flight next week, as she will be too anxious to fly. She has been on Xanax  for several years and states Xanax  has been the only medication that helps control her anxiety. She denies suicidal or homicidal thoughts. Per chart review, it appears patient last refilled a 15-day supply of Xanax  on 02/19/2024. Patient denies alcohol use, however per chart review patient had admitted to taking 1-2 shots per day. She appears to be pre-contemplative regarding her alcohol use. Patient states she is hoping to be  provided with a Xanax  refill at discharge for her upcoming trip. Patient also requesting to be connected with an outpatient psychiatrist.  We will continue to follow. Will attempt to reach out to patient's outpatient provider for further clarification regarding her medication regimen, psych history and make additional changes if indicated from there. 04/01/2024: The patient was seen and reevaluated.  She continues to be focused on her Xanax  and the fact that she is going to visit family in [place]  and she would like to postpone tapering of Xanax  until after January. She can always discuss this  with her PCP.  Unfortunately she missed multiple appointments with a psychiatrist who will not see her.  Upon discharge she can also go as a walk-in to the [facility] for a assessment although there is no guarantee that she will be prescribed Xanax  again.   She is aware of that.  Please see plan below for detailed recommendations.   Diagnoses:  Active Hospital problems: Principal Problem:   Torsades de pointes [facility]) Active Problems:   Moderate protein-calorie malnutrition   Anxiety   Electrolyte disturbance   Recurrent major depressive disorder   Cardiac arrhythmia   Gastroparesis   Human immunodeficiency virus (HIV) disease (HCC)   Hypophosphatemia   Morbid (severe) obesity due to excess calories (HCC)    Plan   ## Psychiatric Medication Recommendations:  -- Continue Xanax  1 mg TID as needed At the time of discharge there are 3 options: 1.  Patient can be referred to [facility] for an assessment to see if she requires long-term as needed Xanax  2.  She can see her PCP and discuss delaying the taper of Xanax  after she comes back 3.  She can taper off the Xanax  while in the hospital and continue with the SSRI as prescribed. There is no contraindication for her to be given a few doses of Xanax  until her next appointment with either her PCP or [facility]  ## Medical Decision Making Capacity: Not specifically addressed in this encounter  ## Further Work-up:  -- Per primary team -- While pt on Qtc prolonging medications, please monitor & replete K+ to 4 and Mg2+ to 2 -- most recent EKG on 12/8 had QtC of 461 -- Pertinent labwork reviewed earlier this admission includes: CBC, Mg, CMP, Phos  ## Disposition:-- There are no psychiatric contraindications to discharge at this time  ## Behavioral / Environmental: -Utilize compassion and acknowledge the patient's experiences while setting clear and realistic expectations for care.    ## Safety and Observation Level:  - Based on my clinical evaluation, I estimate the patient to be at low risk of self harm in the current setting. - At this time, we recommend  routine observation. This decision is based on my review of the chart including patient's history and current presentation, interview of the  patient, mental status examination, and consideration of suicide risk including evaluating suicidal ideation, plan, intent, suicidal or self-harm behaviors, risk factors, and protective factors. This judgment is based on our ability to directly address suicide risk, implement suicide prevention strategies, and develop a safety plan while the patient is in the clinical setting. Please contact our team if there is a concern that risk level has changed.  CSSR Risk Category:C-SSRS RISK CATEGORY: No Risk  Suicide Risk Assessment: Patient has following modifiable risk factors for suicide: under treated depression , active mental illness (to encompass adhd, tbi, mania, psychosis, trauma reaction), current symptoms: anxiety/panic, insomnia, impulsivity, anhedonia, hopelessness, and pain, medical illness (ie new dx of cancer) Patient has following non-modifiable or demographic risk factors for suicide: psychiatric hospitalization Patient has the following protective factors against suicide: Supportive family, no history of suicide attempts, and no history of NSSIB  Thank you for this consult request. Recommendations have been communicated to the primary team.  We will sign off at this time.   PAULETTE BEETS, MD      History of Present Illness  Relevant Aspects of Hospital Course:  Admitted on 03/28/2024 for chest pain and shortness of breath and was found to have runs of NSVT mostly PM,  bigeminy and early signs of potential TdP conversion. On workup she was noted to have magnesium  of 0.7, serum potassium of 2.4 and lactate of 7.9. She was started on IV lidocaine  infusion and was given IV Ativan  with good decrease in mental status she was intubated and was admitted to ICU. She received aggressive electrolyte replacement and was on lidocaine  infusion at 1 mg. She was stabilized and subsequently extubated. She initially denies any ETOH but upon further questioning, she admits to ongoing 1-2 shots of ETOH per day.  Per primary, they suspect ETOH abuse as primary driver of recurrent TdP along with N/V from [place] .  Initial Patient Report:   On initial evaluation, patient reports she has a long history of depression dating back to her teenage years.  Reports she moved from [place]  to [place]  in her early teens and began experiencing insomnia, vivid dreams, and severe anxiety and depression. States she was diagnosed with bipolar disorder at 43 years old, but does not agree with this diagnosis.  She denies any past history of mania. She also reports she was diagnosed with ADHD as an adult, alongside autism and cPTSD, although she has never been formally diagnosed with these. She states she has been off and on antidepressants and anxiety meds since she was a teenager. Describes periods of severe anxiety and panic attacks in which she has shortness of breath, nausea, diarrhea, and ruminating thoughts that lasts for hours at a time.  Describes feeling restless and overwhelmed with completing daily tasks.  Describes feeling excessive worry, which prevents her from normal social functioning.  States she did not leave her house for 78 days at one time due to fear of having another panic attack in public.   Reports on Thanksgiving day she had what she thought was a panic attack, but after noticing her symptoms did not resolve, she presented to the hospital and was found to be in polymorphic ventricular tachycardia.  She was put in a medically induced coma for 24 hours.  At that time, she was taken off of her outpatient psychiatric medications including Prozac  and lithium .  She reports she met with her PCP and he encouraged her to try to get off of Xanax .  Patient states Xanax  has been the only medication that has helped control her anxiety.  States she has tried several coping strategies and meds to manage anxiety but none of them have been effective.  Previous med trials include: Abilify, Rexulti, BuSpar , doxepin,  hydroxyzine , propranolol, Vilazodone .  States most recently she was prescribed Zoloft  but does not believe it is working, although she has not been taking it regularly.  Patient states she has an upcoming trip to [place]  and would like to have Xanax  to help control anxiety on the plane ride.  Patient reports she got down to a dose of 0.5 mg Xanax  per day when she was taking both Prozac  and lithium  together, however feels like she needs more Xanax  now that she is no longer taking Prozac  and lithium .  She reports 5 previous psychiatric hospitalizations between the ages of [age].  Denies previous suicide attempts or self-injurious behavior.  Reports she had bulimia in her early 27s but is now resolved.  Denies AVH.  Reports experiencing nightmares and flashbacks from living in [place] during 9[place]11.  Patient denies substance use, however per chart review patient reported drinking 1-2 shots daily. 04/01/2024: Patient was seen and evaluated and the case was discussed with her attending.  Patient continues to  endorse occasional episodes of anxiety/panic symptoms that appear to respond to as needed Xanax .  Apparently she has been on Xanax  for almost 10 years and claims that she uses it very sparingly and no more than 2-3 times a week.  She has been unable to see her psychiatrist would like her to see a psychiatrist for continuation of her medications and has suggested tapering off the Xanax .  No other signs and symptoms of SI/HI/AVH noted.  Patient continues to contract for safety.  She is cognitively intact.  Psych ROS:  Depression: Yes Anxiety:  Yes Mania (lifetime and current): No Psychosis: (lifetime and current): No  Review of Systems  Gastrointestinal:  Negative for abdominal pain, constipation, diarrhea, nausea and vomiting.  Neurological:  Negative for dizziness and headaches.  All other systems reviewed and are negative.    Psychiatric and Social History  Psychiatric History: Information  collected from patient and chart review  Prev Dx/Sx: GAD, MDD, ADHD Current Psych Provider: Augustin Prader, PA Home Meds (current): most recently Xanax , Lithium , Prozac   Previous Med Trials: Abilify, Rexulti, BuSpar , doxepin, hydroxyzine , propranolol, vilazodone . Therapy: Unknown  Prior Psych Hospitalization: 5 prior hospitalizations between ages of [age] for depression  Prior Self Harm: Denies Prior Violence: Denies  Family Psych History: Mother - depression; father - depression Family Hx suicide: Denies  Social History:  Patient is married and lives with her husband.  Patient owns a daycare.  Substance History Alcohol: reports drinking 1-2 shots per day   Exam Findings  Vital Signs:  Temp:  [97.6 F (36.4 C)-99.4 F (37.4 C)] 99.1 F (37.3 C) (12/10 1135) Pulse Rate:  [56-73] 60 (12/10 0735) Resp:  [9-21] 14 (12/10 1135) BP: (81-119)/(45-88) 94/63 (12/10 1135) SpO2:  [89 %-100 %] 100 % (12/10 0300) Weight:  [110.2 kg] 110.2 kg (12/10 0300) Blood pressure 94/63, pulse 60, temperature 99.1 F (37.3 C), temperature source Oral, resp. rate 14, height 5' 2 (1.575 m), weight 110.2 kg, SpO2 100%. Body mass index is 44.44 kg/m.  Physical Exam Vitals and nursing note reviewed.  Constitutional:      General: She is not in acute distress.    Appearance: Normal appearance. She is obese. She is not ill-appearing.  HENT:     Head: Normocephalic and atraumatic.  Pulmonary:     Effort: Pulmonary effort is normal. No respiratory distress.  Neurological:     General: No focal deficit present.     Mental Status: She is alert and oriented to person, place, and time. Mental status is at baseline.  Psychiatric:        Mood and Affect: Mood is anxious.        Behavior: Behavior normal.    Mental Status Exam: General Appearance: Casual and Fairly Groomed  Orientation:  Full (Time, Place, and Person)  Memory:  Immediate;   Good Recent;   Good  Concentration:  Concentration: Good  and Attention Span: Good  Recall:  Good  Attention  Good  Eye Contact:  Good  Speech:  Clear and Coherent and Normal Rate  Language:  Good  Volume:  Normal  Mood: Dysthymic, tearful  Affect:  Tearful  Thought Process:  Coherent, Goal Directed, and Linear  Thought Content:  Rumination  Suicidal Thoughts:  No  Homicidal Thoughts:  No  Judgement:  Fair  Insight:  Fair  Psychomotor Activity:  Normal  Akathisia:  No  Fund of Knowledge:  Good   Assets:  Communication Skills Housing Resilience Social Support  Cognition:  WNL  ADL's:  Intact  AIMS (if indicated):      Other History   These have been pulled in through the EMR, reviewed, and updated if appropriate.  Family History:  [name] family history includes Depression in her father and mother; Hyperlipidemia in her father; Hypertension in her father; Hypothyroidism in her mother; Obesity in her father and mother; Thyroid disease in her mother.  Medical History: Past Medical History:  Diagnosis Date   ADHD    Anxiety    Asthma    usually with colds   Avascular necrosis (HCC)    Back pain    Depression    Diabetes mellitus without complication (HCC)    Elevated LFTs    Fatty liver 04/16/2016   Noted US  ABD   GERD (gastroesophageal reflux disease)    Heart murmur    at birth, no issues   History of heat stroke    with syncope   History of thrombocytopenia    prior to diagnosis HIV   HIV (human immunodeficiency virus infection) (HCC)    Hyperlipidemia    Hypertension    Hypothyroidism    Infertility, female    Obese    PCOS (polycystic ovarian syndrome)    Pneumonia 04/17/2016   SOB (shortness of breath)    Wears partial dentures    upper   Wrist fracture 11/2017   Left    Surgical History: Past Surgical History:  Procedure Laterality Date   BIOPSY  06/22/2023   Procedure: BIOPSY;  Surgeon: Shila Gustav GAILS, MD;  Location: [facility];  Service: Gastroenterology;;   ESOPHAGOGASTRODUODENOSCOPY  (EGD) WITH PROPOFOL  N/A 06/22/2023   Procedure: ESOPHAGOGASTRODUODENOSCOPY (EGD) WITH PROPOFOL ;  Surgeon: Shila Gustav GAILS, MD;  Location: [facility];  Service: Gastroenterology;  Laterality: N/A;   HYSTEROSCOPY N/A 12/31/2017   Procedure: HYSTEROSCOPY and polypectomy;  Surgeon: Yalcinkaya, Tamer, MD;  Location: [facility];  Service: Gynecology;  Laterality: N/A;   I & D EXTREMITY Left 07/15/2020   Procedure: PARTIAL EXCISION LEFT TIBIA;  Surgeon: Harden Jerona GAILS, MD;  Location: [facility] OR;  Service: Orthopedics;  Laterality: Left;   IR FLUORO GUIDE CV LINE LEFT  09/26/2020   IR RADIOLOGIST EVAL & MGMT  08/12/2020   maxillofacial surgery     OPEN REDUCTION INTERNAL FIXATION (ORIF) FOOT LISFRANC FRACTURE Left 06/11/2023   Procedure: OPEN TREAMENT OF LEFT FIRST, SECOND, FOURTH METATARSALS, MEDIAL CUNEIFORM FRACTURE, CUBOID, FIRST SECOND THRID TARSOMETATARSAL AND LISFRANC JOINT;  Surgeon: Elsa Lonni SAUNDERS, MD;  Location: [facility] OR;  Service: Orthopedics;  Laterality: Left;   RIGHT/LEFT HEART CATH AND CORONARY ANGIOGRAPHY N/A 03/19/2024   Procedure: RIGHT/LEFT HEART CATH AND CORONARY ANGIOGRAPHY;  Surgeon: Anner Alm ORN, MD;  Location: [facility] INVASIVE CV LAB;  Service: Cardiovascular;  Laterality: N/A;   TEMPORARY PACEMAKER N/A 03/19/2024   Procedure: TEMPORARY PACEMAKER;  Surgeon: Anner Alm ORN, MD;  Location: [facility]VASIVE CV LAB;  Service: Cardiovascular;  Laterality: N/A;   TEMPORARY PACEMAKER N/A 03/29/2024   Procedure: TEMPORARY PACEMAKER;  Surgeon: Jordan, Peter M, MD;  Location: [facility] INVASIVE CV LAB;  Service: Cardiovascular;  Laterality: N/A;   Medications:   Current Facility-Administered Medications:    acetaminophen  (TYLENOL ) tablet 650 mg, 650 mg, Oral, Q6H PRN, Stoner, Benjamin J, MD, 650 mg at 03/31/24 0940   ALPRAZolam  (XANAX ) tablet 1 mg, 1 mg, Oral, TID PRN, Dela Cruz, Anton M, MD, 1 mg at 04/01/24 0912   aspirin  EC tablet 81 mg, 81 mg, Oral, Daily, Paytes, Maurilio PENNER, RPH, 81  mg  at 04/01/24 0853   atorvastatin  (LIPITOR ) tablet 80 mg, 80 mg, Oral, Daily, Paytes, Maurilio PENNER, RPH, 80 mg at 04/01/24 9146   Chlorhexidine  Gluconate Cloth 2 % PADS 6 each, 6 each, Topical, Daily, Jordan, Peter M, MD, 6 each at 04/01/24 9146   dolutegravir -lamiVUDine  ([name] ) 50-300 MG per tablet 1 tablet, 1 tablet, Oral, QPM, Jordan, Peter M, MD, 1 tablet at 03/31/24 1829   enoxaparin  (LOVENOX ) injection 50 mg, 50 mg, Subcutaneous, Q24H, Stoner, Benjamin J, MD, 50 mg at 03/31/24 1709   folic acid  (FOLVITE ) tablet 1 mg, 1 mg, Oral, Daily, Jordan, Peter M, MD, 1 mg at 04/01/24 9146   mupirocin  ointment (BACTROBAN ) 2 % 1 Application, 1 Application, Nasal, BID, Debarah Donley SAILOR, MD, 1 Application at 04/01/24 9145   nadolol  (CORGARD ) tablet 60 mg, 60 mg, Oral, Daily, Ollis, Brandi L, NP, 60 mg at 04/01/24 9087   Oral care mouth rinse, 15 mL, Mouth Rinse, PRN, Zenaida Morene PARAS, MD   pantoprazole  (PROTONIX ) injection 40 mg, 40 mg, Intravenous, QHS, Chand, Sudham, MD, 40 mg at 03/31/24 2124   polyethylene glycol (MIRALAX  / GLYCOLAX ) packet 17 g, 17 g, Oral, BID, Stoner, Benjamin J, MD   senna-docusate (Senokot-S) tablet 2 tablet, 2 tablet, Oral, BID, Zenaida Morene PARAS, MD   sorbitol  70 % solution 60 mL, 60 mL, Oral, Once, Zenaida Morene PARAS, MD  Allergies: Allergies  Allergen Reactions   Citalopram Hydrobromide Other (See Comments)    Felt weird 04/2021 Admission for pVT/torsades 03/2024    Fluconazole  Other (See Comments)    Required hospitalization for VT/torsades > would not restart     Lithium  Other (See Comments)    Required hospitalization for VT/torsades 03/2024 > would not restart     Cephalexin  Other (See Comments)    Burning face   Mucinex  Fast-Max Chest Cong Ms [Guaifenesin ] Nausea And Vomiting   Ancef  [Cefazolin ] Rash   Hydrocodone  Itching   Latex Other (See Comments)    As a teenager, when using latex condoms, she would get a yeast infection.   Lisinopril Cough   PAULETTE BEETS, MD

## 2024-04-01 NOTE — TOC Initial Note (Signed)
 Transition of Care [facility]) - Initial/Assessment Note    Patient Details  Name: Lindsay French MRN: [id number] Date of Birth: 05/07/1980  Transition of Care [facility]) CM/SW Contact:    Arlana JINNY Nicholaus [place] Phone Number: [phone] 04/01/2024, 11:33 AM  Clinical Narrative:  11:14 AM- Patient was with the M. CSW/CM will follow up at a more appropriate time.   HF CSW met with patient at bedside. Patient stated that she lives with husband. Patient stated that she has no history of HH services. Patient stated that she currently uses oxygen at home. Patient stated that she has a scale at home. Patient stated that she has a PCP. CSW explained that a hospital follow up is typically scheduled closer towards dc. Patient agreeable. Patient stated that she was last seen 6-8 weeks ago, and this morning by PCP. Patient husband will provide transportation at dc.   Unit CM/SW will continue to follow and monitor for dc readiness.                   Expected Discharge Plan: IP Rehab Facility Barriers to Discharge: Continued Medical Work up   Patient Goals and CMS Choice Patient states their goals for this hospitalization and ongoing recovery are:: wants to recover          Expected Discharge Plan and Services   Discharge Planning Services: CM Consult   Living arrangements for the past 2 months: Single Family Home                                      Prior Living Arrangements/Services Living arrangements for the past 2 months: Single Family Home Lives with:: Spouse Patient language and need for interpreter reviewed:: Yes Do you feel safe going back to the place where you live?: Yes      Need for Family Participation in Patient Care: Yes (Comment) Care giver support system in place?: Yes (comment) Current home services: DME (scooter, oxygen, RW, cane) Criminal Activity/Legal Involvement Pertinent to Current Situation/Hospitalization: No - Comment as needed  Activities of Daily Living       Permission Sought/Granted Permission sought to share information with : Case Manager, Family Supports, PCP Permission granted to share information with : Yes, Verbal Permission Granted  Share Information with NAME: Shanena Pellegrino  Permission granted to share info w AGENCY: Home Health, PCP, DME  Permission granted to share info w Relationship: husband  Permission granted to share info w Contact Information: [phone]  Emotional Assessment Appearance:: Appears stated age Attitude/Demeanor/Rapport: Engaged Affect (typically observed): Accepting Orientation: : Oriented to Self, Oriented to Place, Oriented to  Time, Oriented to Situation   Psych Involvement: No (comment)  Admission diagnosis:  Electrolyte disturbance [E87.8] Cardiac arrhythmia, unspecified cardiac arrhythmia type [I49.9] Torsades de pointes (HCC) [I47.21] Patient Active Problem List   Diagnosis Date Noted   Hypophosphatemia 03/31/2024   Morbid (severe) obesity due to excess calories (HCC) 03/31/2024   Electrolyte disturbance 03/30/2024   Recurrent major depressive disorder 03/30/2024   Cardiac arrhythmia 03/30/2024   Gastroparesis 03/30/2024   Human immunodeficiency virus (HIV) disease (HCC) 03/30/2024   Diabetes mellitus (HCC) 03/23/2024   Torsades de pointes (HCC) 03/19/2024   Dysuria 01/22/2024   Hypomagnesemia 01/09/2024   Prolonged QT interval 01/09/2024   Abdominal pain 01/09/2024   Hypokalemia 01/09/2024   Anxiety    Acute gastric ulcer with hemorrhage 06/22/2023   Gastritis and gastroduodenitis 06/22/2023  Upper GI bleed 06/21/2023   Morbid obesity (HCC) 06/21/2023   Closed fracture dislocation foot with nonunion 06/21/2023   Iron  deficiency anemia due to chronic blood loss 06/21/2023   Melena 06/21/2023   Lisfranc dislocation, left, initial encounter 05/26/2023   Diabetic foot infection (HCC) 04/30/2023   Insomnia 04/30/2023   Thrush 06/12/2022   Anxiety and depression 01/29/2022   At  risk for heart disease 09/20/2021   Folate deficiency 08/25/2021   Vitamin D  deficiency 08/25/2021   Hyperlipidemia associated with type 2 diabetes mellitus (HCC) 08/25/2021   Type 2 diabetes mellitus with hyperglycemia, without long-term current use of insulin  (HCC) 08/25/2021   Hypertension associated with type 2 diabetes mellitus (HCC) 08/25/2021   At risk for hyperglycemia 08/25/2021   Dental infection 10/21/2020   Left upper extremity swelling 10/21/2020   Intractable nausea and vomiting 10/12/2020   Chronic narcotic use 08/30/2020   Arthralgia of left lower leg    Cellulitis of left leg    Moderate protein-calorie malnutrition    Hypothyroidism    GERD (gastroesophageal reflux disease)    Hepatic steatosis    Thrombocytosis    Macrocytic anemia    Back pain 03/24/2019   Infertility, female 03/24/2019   PCOS (polycystic ovarian syndrome) 03/24/2019   HIV disease (HCC) 04/24/2018   Health care maintenance 04/24/2018   Hyperlipidemia 04/10/2017   Acute respiratory failure with hypoxia (HCC) 04/16/2016   Acute bronchitis 04/16/2016   Hypertension 04/16/2016   Class 3 obesity (HCC) 03/21/2016   Screening examination for venereal disease 03/30/2014   Encounter for long-term (current) use of medications 03/30/2014   Idiopathic thrombocytopenic purpura (ITP) (HCC) 11/14/2010   ADHD (attention deficit hyperactivity disorder) 11/14/2010   PCP:  Eben Reyes BROCKS, MD Pharmacy:   [id number] - [place], [place] - [address] AT [facility] OF [address] [place] [address] Phone: [phone] Fax: [phone]  [facility] [address] [place] [address] Phone: [phone] Fax: [phone]  [facility] [address], [address] [place] [address] Phone: [phone] Fax: [phone]     Social Drivers of Health (SDOH) Social History: SDOH  Screenings   Food Insecurity: Food Insecurity Present (03/31/2024)  Housing: Low Risk  (03/31/2024)  Transportation Needs: Unmet Transportation Needs (03/31/2024)  Utilities: Not At Risk (03/31/2024)  Alcohol Screen: Low Risk  (07/31/2022)  Depression (PHQ2-9): High Risk (01/22/2024)  Financial Resource Strain: Low Risk  (07/31/2022)  Physical Activity: Sufficiently Active (07/31/2022)  Social Connections: Moderately Integrated (07/31/2022)  Recent Concern: Social Connections - Moderately Isolated (06/12/2022)  Stress: No Stress Concern Present (07/31/2022)  Tobacco Use: Medium Risk (03/28/2024)   SDOH Interventions:     Readmission Risk Interventions     No data to display

## 2024-04-01 NOTE — Hospital Course (Addendum)
 Torsades de Pointes / Prolonged QTc: On admission, Ms. Pescador was found to be in torsades de pointes after presenting with acute nausea, vomiting, and diarrhea followed by syncope and seizure-like activity. Her QTc was markedly prolonged at 514 ms. Several of her outpatient medications--including Compazine , fluconazole , Augmentin, and prior SSRI therapy--were identified as QT-prolonging contributors. She received emergent treatment in the ED with IV magnesium , IV potassium, and IV fluids, followed by initiation of a lidocaine  infusion for ventricular arrhythmia suppression. Due to recurrent polymorphic VT, she required intubation and deep sedation, and electrophysiology placed a transvenous pacemaker with a goal HR >100 bpm for arrhythmia control. She was admitted to the cardiac ICU, where no further episodes of torsades occurred after electrolyte correction and pacing support. She was extubated on 12/7, her lidocaine  infusion was discontinued on 12/8, and the temporary pacing wire was removed on 12/9. She has remained in stable sinus rhythm without recurrence and will follow with EP for discharge recommendations regarding QT-safe medications and long-term prevention. A transthoracic echocardiogram was ordered to further evaluate cardiac structure. At discharge she was stable.   Electrolyte Disturbances (Hypokalemia, Hypomagnesemia): Her presentation was notable for severe hypokalemia and hypomagnesemia, likely secondary to gastrointestinal losses and baseline low stores. Cardiology set strict replacement targets of potassium >4.5 and magnesium  >2.5, and she underwent aggressive IV repletion throughout her ICU stay. Her electrolytes corrected appropriately and remained within therapeutic targets. Avoidance of QT-prolonging medications was reinforced to prevent recurrence at discharge.   Acute Respiratory Failure Requiring Intubation: Ms. Tassinari required emergent intubation in the ED for airway protection during  recurrent torsades events and heavy sedation. She was mechanically ventilated using lung-protective strategies and maintained at a deep sedation level (RASS -3 to -4). Her initial lactate was significantly elevated at 7.9, consistent with hypoperfusion during arrhythmia, and improved with resuscitation. She was successfully extubated on 12/7 and demonstrated stable respiratory function without further hypoxic episodes.  Nausea, Vomiting, and Diarrhea: The patient presented with acute onset hourly episodes of non-bloody, non-bilious vomiting and diarrhea without abdominal pain or fever. These symptoms likely triggered the electrolyte losses that precipitated her torsades. Her gastrointestinal symptoms resolved with IV fluids, electrolyte repletion, withholding QT-prolonging antiemetics, and supportive care. She resumed a regular diet without recurrence.  Anxiety, Panic Disorder, and Depression Psychiatry was consulted given her significant anxiety, distress, and repeated requests for a Xanax  refill for an upcoming trip. She was assessed as having generalized anxiety disorder, panic disorder, and major depressive disorder, with chronic reliance on benzodiazepines and inconsistent SSRI adherence. She denied suicidal or homicidal ideation and did not meet criteria for inpatient psychiatric admission. Chart review suggested underreported alcohol use, as prior documentation noted daily intake of 1-2 shots despite her denying use. Psychiatry advised coordination with her outpatient prescriber to clarify her regimen and emphasized caution with QT-prolonging psychotropics. She remains psychiatrically stable and appropriate for outpatient follow-up.   Other chronic conditions were medically managed with home medications and formulary alternatives as necessary (Anemia, HIV, HLD)

## 2024-04-01 NOTE — Progress Notes (Signed)
 Mobility Specialist Progress Note:    04/01/24 1245  Mobility  Activity Ambulated with assistance  Level of Assistance Independent after set-up  Assistive Device None  Distance Ambulated (ft) 500 ft  Range of Motion/Exercises Active  Activity Response Tolerated well  Mobility Referral Yes  Mobility visit 1 Mobility  Mobility Specialist Start Time (ACUTE ONLY) 1245  Mobility Specialist Stop Time (ACUTE ONLY) 1317  Mobility Specialist Time Calculation (min) (ACUTE ONLY) 32 min   Received pt laying in bed eager and agreeable to session. No c/o any symptoms. Pt moving and ambulating well. Pt feeling as though mental cognition may be off and would like to return to baseline. Pt experiences anxiety w/ certain things such as more crowds. Returned pt to room w/ all needs met.   Venetia Keel Mobility Specialist Please Neurosurgeon or Rehab Office at [phone]

## 2024-04-01 NOTE — Discharge Summary (Addendum)
 Name: Lindsay French MRN: [id number] DOB: Apr 10, 1981 43 y.o. PCP: Lindsay Reyes BROCKS, MD  Date of Admission: 03/28/2024 11:02 PM Date of Discharge: 04/01/2024  Attending Physician: Dr. Jone French  Discharge Diagnosis: Principal Problem:   Torsades de pointes [facility]) Active Problems:   Moderate protein-calorie malnutrition   Anxiety   Electrolyte disturbance   Recurrent major depressive disorder   Cardiac arrhythmia   Gastroparesis   Human immunodeficiency virus (HIV) disease (HCC)   Hypophosphatemia   Morbid (severe) obesity due to excess calories (HCC)     Allergies as of 04/01/2024       Reactions   Citalopram Hydrobromide Other (See Comments)   Felt weird 04/2021 Admission for pVT/torsades 03/2024    Fluconazole  Other (See Comments)   Required hospitalization for VT/torsades > would not restart     Lithium  Other (See Comments)   Required hospitalization for VT/torsades 03/2024 > would not restart     Cephalexin  Other (See Comments)   Burning face   Mucinex  Fast-max Chest Cong Ms [guaifenesin ] Nausea And Vomiting   Ancef  [cefazolin ] Rash   Hydrocodone  Itching   Latex Other (See Comments)   As a teenager, when using latex condoms, she would get a yeast infection.   Lisinopril Cough    Discharge Medications:  TAKE THESE MEDICATIONS  irbesartan  150 MG tablet Commonly known as: AVAPRO     budesonide -formoterol  80-4.5 MCG/ACT inhaler Commonly known as: Symbicort    [name]  50-300 MG tablet Generic drug: dolutegravir -lamiVUDine     albuterol  108 (90 Base) MCG/ACT inhaler Commonly known as: VENTOLIN  HFA INHALE 1 PUFF INTO THE LUNGS EVERY 6 HOURS AS NEEDED FOR WHEEZING OR SHORTNESS OF BREATH   ALPRAZolam  1 MG tablet Commonly known as: XANAX  Take 1 tablet (1 mg total) by mouth 3 (three) times daily as needed for anxiety.   Aspirin  Low Dose 81 MG chewable tablet Generic drug: aspirin  Chew 1 tablet (81 mg total) by mouth daily.   lithium  carbonate 150 MG  capsule Take 450 mg by mouth daily at 12 noon.   magnesium  oxide 400 (240 Mg) MG tablet Commonly known as: FT Magnesium  Oxide Take 1 tablet (400 mg total) by mouth daily. Replaces: Magnesium  200 MG Tabs   nadolol  20 MG tablet Commonly known as: CORGARD  Take 3 tablets (60 mg total) by mouth daily. Start taking on: April 02, 2024  naloxone 4 MG/0.1ML Liqd nasal spray kit Commonly known as: NARCAN     PAUSE THESE MEDICATIONS:  Mounjaro  2.5 MG/0.5ML Pen Hold this until you follow with your outpatient provider Generic drug: tirzepatide     amLODipine  5 MG tablet Hold this until you follow with your outpatient provider Commonly known as: NORVASC    atorvastatin  80 MG tablet Hold this until you follow with your outpatient provider Commonly known as: LIPITOR    BLOOD GLUCOSE TEST STRIPS Strp Hold this until you follow with your outpatient provider   budesonide -formoterol  80-4.5 MCG/ACT inhaler Hold this until you follow with your outpatient provider Commonly known as: Symbicort    [name]  50-300 MG tablet Hold this until you follow with your outpatient provider Generic drug: dolutegravir -lamiVUDine      folic acid  1 MG tablet Hold this until you follow with your outpatient provider Commonly known as: FOLVITE    hydrOXYzine  25 MG tablet Hold this until you follow with your outpatient provider Commonly known as: ATARAX    Imodium A-D 2 MG capsule Hold this until you follow with your outpatient provider Generic drug: loperamide   irbesartan  150 MG tablet Hold this until you  follow with your outpatient provider Commonly known as: AVAPRO    Iron  (Ferrous Sulfate ) 325 (65 Fe) MG  Hold this until you follow with your outpatient provider   Lancets Misc Hold this until you follow with your outpatient provider   Magnesium  200 MG Tabs Replaced by: magnesium  oxide 400 (240 Mg) MG tablet     NEOMYCIN -POLYMYXIN-HYDROCORTISONE 1 % Soln OTIC solution Hold this until you  follow with your outpatient provider Commonly known as: CORTISPORIN    pantoprazole  40 MG tablet Hold this until you follow with your outpatient provider Commonly known as: PROTONIX    sertraline  50 MG tablet Hold this until you follow with your outpatient provider Commonly known as: ZOLOFT      STOP THESE MEDICATIONS:  FLUOXETINE  HCL PO Commonly known as: PROZAC      Disposition and follow-up:   Ms.Lindsay French was discharged from [facility] in Stable condition.  At the hospital follow up visit please address:  1. PCP Recommendations: - Ensure patient follows up with outpatient psychiatry [facility] - Ensure follow-up with cardiology and electrophysiology, who will manage long-term plans for preventing further episodes of torsades. - Review and adjust medications to avoid any drugs that may prolong the QT interval. - Monitor blood pressure and restart ARB or other antihypertensives as appropriate, since you were not taking your blood pressure medication before admission. - Review diabetes management, including Mounjaro  use, hydration, and any GI symptoms that could lead to electrolyte loss.   Hospital Course by problem list: Torsades de Pointes / Prolonged QTc: On admission, [name] was found to be in torsades de pointes after presenting with acute nausea, vomiting, and diarrhea followed by syncope and seizure-like activity. Her QTc was markedly prolonged at 514 ms. Several of her outpatient medications--including Compazine , fluconazole , Augmentin, and prior SSRI therapy--were identified as QT-prolonging contributors. She received emergent treatment in the ED with IV magnesium , IV potassium, and IV fluids, followed by initiation of a lidocaine  infusion for ventricular arrhythmia suppression. Due to recurrent polymorphic VT, she required intubation and deep sedation, and electrophysiology placed a transvenous pacemaker with a goal HR >100 bpm for arrhythmia control. She  was admitted to the cardiac ICU, where no further episodes of torsades occurred after electrolyte correction and pacing support. She was extubated on 12/7, her lidocaine  infusion was discontinued on 12/8, and the temporary pacing wire was removed on 12/9. She has remained in stable sinus rhythm without recurrence and will follow with EP for discharge recommendations regarding QT-safe medications and long-term prevention. A transthoracic echocardiogram was ordered to further evaluate cardiac structure. At discharge she was stable.   Electrolyte Disturbances (Hypokalemia, Hypomagnesemia): Her presentation was notable for severe hypokalemia and hypomagnesemia, likely secondary to gastrointestinal losses and baseline low stores. Cardiology set strict replacement targets of potassium >4.5 and magnesium  >2.5, and she underwent aggressive IV repletion throughout her ICU stay. Her electrolytes corrected appropriately and remained within therapeutic targets. Avoidance of QT-prolonging medications was reinforced to prevent recurrence at discharge.   Acute Respiratory Failure Requiring Intubation: Ms. Yagi required emergent intubation in the ED for airway protection during recurrent torsades events and heavy sedation. She was mechanically ventilated using lung-protective strategies and maintained at a deep sedation level (RASS -3 to -4). Her initial lactate was significantly elevated at 7.9, consistent with hypoperfusion during arrhythmia, and improved with resuscitation. She was successfully extubated on 12/7 and demonstrated stable respiratory function without further hypoxic episodes.  Nausea, Vomiting, and Diarrhea: The patient presented with acute onset hourly episodes of  non-bloody, non-bilious vomiting and diarrhea without abdominal pain or fever. These symptoms likely triggered the electrolyte losses that precipitated her torsades. Her gastrointestinal symptoms resolved with IV fluids, electrolyte repletion,  withholding QT-prolonging antiemetics, and supportive care. She resumed a regular diet without recurrence.  Anxiety, Panic Disorder, and Depression Psychiatry was consulted given her significant anxiety, distress, and repeated requests for a Xanax  refill for an upcoming trip. She was assessed as having generalized anxiety disorder, panic disorder, and major depressive disorder, with chronic reliance on benzodiazepines and inconsistent SSRI adherence. She denied suicidal or homicidal ideation and did not meet criteria for inpatient psychiatric admission. Chart review suggested underreported alcohol use, as prior documentation noted daily intake of 1-2 shots despite her denying use. Psychiatry advised coordination with her outpatient prescriber to clarify her regimen and emphasized caution with QT-prolonging psychotropics. She remains psychiatrically stable and appropriate for outpatient follow-up.   Other chronic conditions were medically managed with home medications and formulary alternatives as necessary (Anemia, HIV, HLD).   Discharge Subjective: Ms.Adonna Duncombe reports she is feeling well, with no acute concerns. States that Xanax  really helps her, and that she has a trip to [place]  coming up in 6 days and needs something for anxiety.    Patient is medically ready for discharge.  Discharge Exam:   BP 101/66 (BP Location: Right Wrist)   Pulse (!) 58   Temp 97.8 F (36.6 C) (Oral)   Resp 19   Ht 5' 2 (1.575 m)   Wt 110.2 kg   SpO2 100%   BMI 44.44 kg/m   Physical Exam: Constitutional: well-appearing female sitting in bed, in no acute distress Cardiovascular: RRR, no murmurs, rubs or gallops Pulmonary/Chest: normal work of breathing on room air, lungs clear to auscultation bilaterally Abdominal: soft, non-tender, non-distended Skin: warm and dry Extremities: upper/lower extremity pulses 2+, no lower extremity edema present   Pertinent Labs, Studies, and Procedures:     Latest Ref  Rng & Units 04/01/2024    6:29 AM 03/31/2024   11:55 AM 03/30/2024    3:59 AM  CBC  WBC 4.0 - 10.5 K/uL 8.6  12.2  10.9   Hemoglobin 12.0 - 15.0 g/dL 88.9  89.1  89.1   Hematocrit 36.0 - 46.0 % 34.2  33.3  33.5   Platelets 150 - 400 K/uL 207  221  207        Latest Ref Rng & Units 04/01/2024    6:29 AM 03/31/2024   11:55 AM 03/30/2024    3:59 AM  CMP  Glucose 70 - 99 mg/dL 96  94  894   BUN 6 - 20 mg/dL <5  <5  <5   Creatinine 0.44 - 1.00 mg/dL 9.37  9.54  9.35   Sodium 135 - 145 mmol/L 133  133  135   Potassium 3.5 - 5.1 mmol/L 3.9  5.0  4.4   Chloride 98 - 111 mmol/L 95  100  97   CO2 22 - 32 mmol/L 30  25  28    Calcium  8.9 - 10.3 mg/dL 8.3  8.4  8.2     DG Abd Portable 1V Result Date: 03/29/2024 EXAM: 1 VIEW XRAY OF THE ABDOMEN 03/29/2024 03:40:00 AM COMPARISON: 03/20/2024 CLINICAL HISTORY: Encounter for orogastric (OG) tube placement FINDINGS: LINES, TUBES AND DEVICES: Enteric tube in stomach with side port below gastroesophageal junction. Inferior approach cardiac pacer lead noted. BOWEL: Paucity of bowel gas. SOFT TISSUES: No abnormal calcifications. BONES: No acute fracture. LUNGS: Similar streaky left lower  lobe atelectasis. IMPRESSION: 1. Enteric tube in good position. Electronically signed by: Morgane Naveau MD 03/29/2024 03:51 AM EST RP Workstation: [id number]   CARDIAC CATHETERIZATION Result Date: 03/29/2024 Successful placement of temporary transvenous pacemaker insertion     Discharge Instructions:   Discharge Instructions      Dear Bernice Lever,  Thank you for letting us  participate in your care. You were in the hospital because your heart went into an abnormal rhythm called torsades. This happened because your magnesium  and potassium levels dropped very low, which made your heart more sensitive to certain medications. You were treated with IV medicines, had your electrolytes replaced, and your heart rhythm is now stable.  Other problems managed this admission  included anxiety, anemia, HIV, and high cholesterol levels.   MEDICATION CHANGES - Magnesium . Take your prescribed magnesium  supplement every day. Do not skip doses.  POST-HOSPITAL & CARE INSTRUCTIONS Take all medications exactly as prescribed. Avoid any new medications (including over-the-counter nausea medicines) unless your doctor approves them, because some medicines can cause your heart rhythm to become unsafe again. Try to eat more magnesium -rich foods and try to drink milk daily, as it will help keep your magnesium  levels steadier. Please call to arrange follow up with your primary care physician in 1 week.  Go to your follow up appointments (listed below)   DOCTOR'S APPOINTMENT   Future Appointments  Date Time Provider Department Center  04/06/2024  9:15 AM Napoleon Limes, MD [facility] [address]  04/29/2024  1:45 PM Theodoro Lakes, MD [facility] [address]  07/07/2024  2:00 PM Orman Erminio POUR, PA-C [facility] None    Take care and be well!  Internal Medicine Teaching Service Inpatient Team [place]  [facility]  [address] [place], [place] [address] [phone]      Signed: Alan Maiden, MD Internal Medicine  Psychiatry Resident, PGY-1 04/01/2024, 5:18 PM Please contact the on call pager after 5 pm and on weekends at [phone].

## 2024-04-01 NOTE — Progress Notes (Signed)
 Reviewed AVS, patient expressed understanding of medications, MD follow up reviewed.   Removed IV, Site clean, dry and intact.  See LDA for information on wounds at discharge. Patient states all belongings brought to the hospital at time of admission are accounted for and packed to take home.  Picked up medications from [facility] pharmacy. Nursing staff  to transport patient to ED Dept entrance where family member was waiting in vehicle to transport home.

## 2024-04-02 ENCOUNTER — Telehealth: Payer: Self-pay

## 2024-04-02 NOTE — Transitions of Care (Post Inpatient/ED Visit) (Signed)
° °  04/02/2024  Name: Lindsay French MRN: [id number] DOB: [birth year]  Today's TOC FU Call Status: Today's TOC FU Call Status:: Unsuccessful Call (1st Attempt)  Attempted to reach the patient regarding the most recent Inpatient/ED visit.  Follow Up Plan: Additional outreach attempts will be made to reach the patient to complete the Transitions of Care (Post Inpatient/ED visit) call.   Medford Balboa, BSN, RN [place]  VBCI - [employer] [facility] RN Care Manager [phone]

## 2024-04-03 ENCOUNTER — Telehealth: Payer: Self-pay

## 2024-04-03 NOTE — Transitions of Care (Post Inpatient/ED Visit) (Signed)
 04/03/2024  Name: Lindsay French MRN: [id number] DOB: [birth year]  Today's TOC FU Call Status: Today's TOC FU Call Status:: Successful TOC FU Call Completed TOC FU Call Complete Date: 04/03/24  Patient's Name and Date of Birth confirmed. Name, DOB  Transition Care Management Follow-up Telephone Call Date of Discharge: 04/01/24 Discharge Facility: [name]) Type of Discharge: Inpatient Admission Primary Inpatient Discharge Diagnosis:: Torsades de Pointes How have you been since you were released from the hospital?: Better Any questions or concerns?: No  Items Reviewed: Did you receive and understand the discharge instructions provided?: Yes Medications obtained,verified, and reconciled?: Yes (Medications Reviewed) Any new allergies since your discharge?: No Dietary orders reviewed?: Yes Type of Diet Ordered:: Low Sodium Heart Healthy Do you have support at home?: Yes People in Home [RPT]: spouse Name of Support/Comfort Primary Source: Lindsay French  Medications Reviewed Today: Medications Reviewed Today     Reviewed by Lindsay French (Case Manager) on 04/03/24 at 1048  Med List Status: <None>   Medication Order Taking? Sig Documenting Provider Last Dose Status Informant  albuterol  (VENTOLIN  HFA) 108 (90 Base) MCG/ACT inhaler [id number] No INHALE 1 PUFF INTO THE LUNGS EVERY 6 HOURS AS NEEDED FOR WHEEZING OR SHORTNESS OF BREATH Lindsay Reyes BROCKS, French Unknown Active Pharmacy Records, Self           Med Note Lindsay French   Fri Mar 20, 2024 10:23 AM)    ALPRAZolam  (XANAX ) 1 MG tablet [id number]  Take 1 tablet (1 mg total) by mouth 3 (three) times daily as needed for anxiety. Lindsay Palma, French  Active   aspirin  81 MG chewable tablet [id number] No Chew 1 tablet (81 mg total) by mouth daily.  Patient not taking: Reported on 03/30/2024   Lindsay French Not Taking Active Self, Pharmacy Records  lithium  carbonate 150 MG capsule [id number]  Take 450 mg by  mouth daily at 12 noon. Provider, Historical, French  Active Self, Pharmacy Records           Med Note Lindsay French   Mon Mar 30, 2024 11:27 AM) Before the hospitalization in late November, patient was taking this medication as shown. It was not on the last med rec that was completed.   magnesium  oxide (FT MAGNESIUM  OXIDE) 400 (240 Mg) MG tablet [id number]  Take 1 tablet (400 mg total) by mouth daily. Lindsay Palma, French  Active   MOUNJARO  2.5 MG/0.5ML Pen [id number] No Inject 2.5 mg into the skin once a week. Provider, Historical, French 03/24/2024 Active Pharmacy Records, Self           Med Note Lindsay French Mar 30, 2024 11:29 AM) This was paused and the patient took it on 12/2  nadolol  (CORGARD ) 20 MG tablet [id number]  Take 3 tablets (60 mg total) by mouth daily. Lindsay Palma, French  Active             Home Care and Equipment/Supplies: Were Home Health Services Ordered?: NA Any new equipment or medical supplies ordered?: No  Functional Questionnaire: Do you need assistance with bathing/showering or dressing?: No Do you need assistance with meal preparation?: Yes (She would like to sign up for MOW) Do you need assistance with eating?: No Do you have difficulty maintaining continence: No Do you have difficulty managing or taking your medications?: Yes ([name] spouse provides assistance with medications)  Follow up appointments reviewed: PCP Follow-up appointment confirmed?: Yes Date of PCP follow-up appointment?: 04/06/24 Follow-up Provider: [facility].  [facility] Follow-up appointment confirmed?: NA Do you need transportation to your follow-up appointment?: No Do you understand care options if your condition(s) worsen?: Yes-patient verbalized understanding  SDOH Interventions Today    Flowsheet Row Most Recent Value  SDOH Interventions   Food Insecurity Interventions Intervention Not Indicated  Housing Interventions Intervention Not Indicated   Transportation Interventions Intervention Not Indicated  Utilities Interventions Intervention Not Indicated    Medford Balboa, BSN, French [place]  VBCI - Population Health French Care Manager [phone]

## 2024-04-06 ENCOUNTER — Ambulatory Visit

## 2024-04-10 ENCOUNTER — Other Ambulatory Visit: Payer: Self-pay | Admitting: Infectious Diseases

## 2024-04-10 DIAGNOSIS — G4700 Insomnia, unspecified: Secondary | ICD-10-CM

## 2024-04-10 DIAGNOSIS — F418 Other specified anxiety disorders: Secondary | ICD-10-CM

## 2024-04-10 DIAGNOSIS — F909 Attention-deficit hyperactivity disorder, unspecified type: Secondary | ICD-10-CM

## 2024-04-10 MED ORDER — ALPRAZOLAM 1 MG PO TABS: 1.0000 mg | ORAL_TABLET | Freq: Three times a day (TID) | ORAL | 0 refills | Status: DC | PRN

## 2024-04-13 ENCOUNTER — Other Ambulatory Visit: Payer: Self-pay | Admitting: Infectious Diseases

## 2024-04-13 DIAGNOSIS — G4700 Insomnia, unspecified: Secondary | ICD-10-CM

## 2024-04-13 DIAGNOSIS — F909 Attention-deficit hyperactivity disorder, unspecified type: Secondary | ICD-10-CM

## 2024-04-13 DIAGNOSIS — F418 Other specified anxiety disorders: Secondary | ICD-10-CM

## 2024-04-13 MED ORDER — ALPRAZOLAM 1 MG PO TABS: 1.0000 mg | ORAL_TABLET | Freq: Three times a day (TID) | ORAL | Status: DC | PRN

## 2024-04-20 NOTE — Telephone Encounter (Signed)
 I called to speak with Lindsay French.  She has been texting me asking her to refill her xanax .  At her hospital d/c, she was supposed to f/u with psychiatry to facilitate this.  She has not done this and there are no pending appts for mental health.  I will continue to try to reach her by phone to clarify this.  I will not refill her xanax  until we have clarified this as well as her continuing to text me for her medical care while not making her appts.

## 2024-04-21 ENCOUNTER — Telehealth: Payer: Self-pay | Admitting: Infectious Diseases

## 2024-04-21 ENCOUNTER — Telehealth: Payer: Self-pay | Admitting: *Deleted

## 2024-04-21 DIAGNOSIS — F909 Attention-deficit hyperactivity disorder, unspecified type: Secondary | ICD-10-CM

## 2024-04-21 DIAGNOSIS — G4700 Insomnia, unspecified: Secondary | ICD-10-CM

## 2024-04-21 DIAGNOSIS — F418 Other specified anxiety disorders: Secondary | ICD-10-CM

## 2024-04-21 MED ORDER — ALPRAZOLAM 1 MG PO TABS: 1.0000 mg | ORAL_TABLET | Freq: Three times a day (TID) | ORAL | 1 refills | Status: DC | PRN

## 2024-04-21 NOTE — Telephone Encounter (Signed)
 Called the breast center to follow up, patient had canceled her appointment and never called back to reschedule her appointment.

## 2024-04-21 NOTE — Telephone Encounter (Signed)
 Called me on my cell She needs refill of her xanax , [place].  I re-iterated that I cannot manage her care over text. She needs to make office appt with me and with psychaitry.  She needs additional rx of xanax  to last til she returns to [place].  Called and spoke with pharmacy [facility]) in [facility] [place] She will call clinic for appt.

## 2024-04-29 ENCOUNTER — Ambulatory Visit

## 2024-05-05 ENCOUNTER — Telehealth: Payer: Self-pay | Admitting: Infectious Diseases

## 2024-05-05 DIAGNOSIS — E119 Type 2 diabetes mellitus without complications: Secondary | ICD-10-CM

## 2024-05-05 MED ORDER — MOUNJARO 2.5 MG/0.5ML ~~LOC~~ SOAJ: 2.5000 mg | SUBCUTANEOUS | 3 refills | Status: AC

## 2024-05-05 NOTE — Telephone Encounter (Signed)
 Lindsay French texted me that she will be in [place]  for the next 6 months and would like me to refill her DM rx so that her husband can mail it to her.  Reduced dose to 2.5mg .

## 2024-05-21 ENCOUNTER — Other Ambulatory Visit: Payer: Self-pay | Admitting: Infectious Diseases

## 2024-05-21 DIAGNOSIS — G4700 Insomnia, unspecified: Secondary | ICD-10-CM

## 2024-05-21 DIAGNOSIS — F418 Other specified anxiety disorders: Secondary | ICD-10-CM

## 2024-05-21 DIAGNOSIS — B370 Candidal stomatitis: Secondary | ICD-10-CM

## 2024-05-21 DIAGNOSIS — F909 Attention-deficit hyperactivity disorder, unspecified type: Secondary | ICD-10-CM

## 2024-05-21 MED ORDER — CLOTRIMAZOLE 10 MG MT TROC: 10.0000 mg | Freq: Every day | OROMUCOSAL | 3 refills | Status: AC

## 2024-05-21 MED ORDER — ALPRAZOLAM 1 MG PO TABS: 1.0000 mg | ORAL_TABLET | Freq: Three times a day (TID) | ORAL | 1 refills | Status: AC | PRN

## 2024-05-28 ENCOUNTER — Other Ambulatory Visit: Payer: Self-pay | Admitting: Infectious Diseases

## 2024-05-28 DIAGNOSIS — B20 Human immunodeficiency virus [HIV] disease: Secondary | ICD-10-CM

## 2024-05-28 MED ORDER — DOVATO 50-300 MG PO TABS: 1.0000 | ORAL_TABLET | Freq: Every day | ORAL | 3 refills | Status: AC

## 2024-07-07 ENCOUNTER — Ambulatory Visit: Admitting: Physician Assistant
